# Patient Record
Sex: Female | Born: 1940 | Race: White | Hispanic: No | Marital: Married | Smoking: Never smoker
Health system: Southern US, Community
[De-identification: ages and names within clinical notes are randomized; demographics above are authoritative.]

## PROBLEM LIST (undated history)

## (undated) DIAGNOSIS — G4733 Obstructive sleep apnea (adult) (pediatric): Secondary | ICD-10-CM

## (undated) DIAGNOSIS — I4891 Unspecified atrial fibrillation: Secondary | ICD-10-CM

## (undated) DIAGNOSIS — H3530 Unspecified macular degeneration: Secondary | ICD-10-CM

## (undated) DIAGNOSIS — T7840XA Allergy, unspecified, initial encounter: Secondary | ICD-10-CM

## (undated) HISTORY — DX: Obstructive sleep apnea (adult) (pediatric): G47.33

## (undated) HISTORY — PX: CHOLECYSTECTOMY: SHX55

## (undated) HISTORY — PX: REPLACEMENT TOTAL KNEE BILATERAL: SUR1225

---

## 1995-05-27 DIAGNOSIS — Z8379 Family history of other diseases of the digestive system: Secondary | ICD-10-CM

## 1995-05-27 HISTORY — DX: Family history of other diseases of the digestive system: Z83.79

## 2001-03-11 ENCOUNTER — Encounter (INDEPENDENT_AMBULATORY_CARE_PROVIDER_SITE_OTHER): Payer: Self-pay | Admitting: Specialist

## 2001-03-11 ENCOUNTER — Ambulatory Visit (HOSPITAL_COMMUNITY): Admission: RE | Admit: 2001-03-11 | Discharge: 2001-03-11 | Payer: Self-pay | Admitting: Gastroenterology

## 2001-03-15 ENCOUNTER — Other Ambulatory Visit: Admission: RE | Admit: 2001-03-15 | Discharge: 2001-03-15 | Payer: Self-pay | Admitting: Obstetrics and Gynecology

## 2001-03-16 ENCOUNTER — Encounter: Admission: RE | Admit: 2001-03-16 | Discharge: 2001-03-16 | Payer: Self-pay | Admitting: Obstetrics and Gynecology

## 2001-03-16 ENCOUNTER — Encounter: Payer: Self-pay | Admitting: Obstetrics and Gynecology

## 2001-03-31 ENCOUNTER — Encounter: Payer: Self-pay | Admitting: Obstetrics and Gynecology

## 2001-03-31 ENCOUNTER — Encounter: Admission: RE | Admit: 2001-03-31 | Discharge: 2001-03-31 | Payer: Self-pay | Admitting: Obstetrics and Gynecology

## 2002-05-02 ENCOUNTER — Other Ambulatory Visit: Admission: RE | Admit: 2002-05-02 | Discharge: 2002-05-02 | Payer: Self-pay | Admitting: Obstetrics and Gynecology

## 2003-05-08 ENCOUNTER — Other Ambulatory Visit: Admission: RE | Admit: 2003-05-08 | Discharge: 2003-05-08 | Payer: Self-pay | Admitting: Obstetrics and Gynecology

## 2003-05-16 ENCOUNTER — Ambulatory Visit (HOSPITAL_COMMUNITY): Admission: RE | Admit: 2003-05-16 | Discharge: 2003-05-16 | Payer: Self-pay | Admitting: Obstetrics and Gynecology

## 2004-05-22 ENCOUNTER — Other Ambulatory Visit: Admission: RE | Admit: 2004-05-22 | Discharge: 2004-05-22 | Payer: Self-pay | Admitting: Obstetrics and Gynecology

## 2004-07-15 ENCOUNTER — Encounter (INDEPENDENT_AMBULATORY_CARE_PROVIDER_SITE_OTHER): Payer: Self-pay | Admitting: *Deleted

## 2004-07-15 ENCOUNTER — Ambulatory Visit (HOSPITAL_COMMUNITY): Admission: RE | Admit: 2004-07-15 | Discharge: 2004-07-15 | Payer: Self-pay | Admitting: Gastroenterology

## 2004-08-13 ENCOUNTER — Encounter: Admission: RE | Admit: 2004-08-13 | Discharge: 2004-11-11 | Payer: Self-pay | Admitting: Family Medicine

## 2004-12-31 ENCOUNTER — Encounter: Admission: RE | Admit: 2004-12-31 | Discharge: 2005-03-31 | Payer: Self-pay | Admitting: Family Medicine

## 2005-05-28 ENCOUNTER — Other Ambulatory Visit: Admission: RE | Admit: 2005-05-28 | Discharge: 2005-05-28 | Payer: Self-pay | Admitting: Obstetrics and Gynecology

## 2007-07-30 ENCOUNTER — Other Ambulatory Visit: Admission: RE | Admit: 2007-07-30 | Discharge: 2007-07-30 | Payer: Self-pay | Admitting: Obstetrics and Gynecology

## 2008-08-04 ENCOUNTER — Encounter: Admission: RE | Admit: 2008-08-04 | Discharge: 2008-08-04 | Payer: Self-pay | Admitting: Family Medicine

## 2008-08-08 ENCOUNTER — Other Ambulatory Visit: Admission: RE | Admit: 2008-08-08 | Discharge: 2008-08-08 | Payer: Self-pay | Admitting: Obstetrics and Gynecology

## 2008-08-09 ENCOUNTER — Encounter: Admission: RE | Admit: 2008-08-09 | Discharge: 2008-08-09 | Payer: Self-pay | Admitting: Family Medicine

## 2008-12-04 ENCOUNTER — Inpatient Hospital Stay (HOSPITAL_COMMUNITY): Admission: RE | Admit: 2008-12-04 | Discharge: 2008-12-11 | Payer: Self-pay | Admitting: Orthopedic Surgery

## 2009-01-24 ENCOUNTER — Ambulatory Visit (HOSPITAL_COMMUNITY): Admission: RE | Admit: 2009-01-24 | Discharge: 2009-01-24 | Payer: Self-pay | Admitting: Cardiology

## 2009-02-14 ENCOUNTER — Ambulatory Visit (HOSPITAL_COMMUNITY): Admission: RE | Admit: 2009-02-14 | Discharge: 2009-02-14 | Payer: Self-pay | Admitting: Orthopedic Surgery

## 2009-08-20 ENCOUNTER — Other Ambulatory Visit: Admission: RE | Admit: 2009-08-20 | Discharge: 2009-08-20 | Payer: Self-pay | Admitting: Obstetrics and Gynecology

## 2010-01-07 ENCOUNTER — Ambulatory Visit: Payer: Self-pay | Admitting: Cardiology

## 2010-01-07 ENCOUNTER — Inpatient Hospital Stay (HOSPITAL_COMMUNITY): Admission: RE | Admit: 2010-01-07 | Discharge: 2010-01-12 | Payer: Self-pay | Admitting: Orthopedic Surgery

## 2010-08-09 LAB — COMPREHENSIVE METABOLIC PANEL
ALT: 25 U/L (ref 0–35)
BUN: 15 mg/dL (ref 6–23)
CO2: 29 mEq/L (ref 19–32)
Calcium: 9.7 mg/dL (ref 8.4–10.5)
Creatinine, Ser: 0.89 mg/dL (ref 0.4–1.2)
Total Bilirubin: 0.7 mg/dL (ref 0.3–1.2)
Total Protein: 6.5 g/dL (ref 6.0–8.3)

## 2010-08-09 LAB — CBC
HCT: 29 % — ABNORMAL LOW (ref 36.0–46.0)
HCT: 32.4 % — ABNORMAL LOW (ref 36.0–46.0)
Hemoglobin: 9.7 g/dL — ABNORMAL LOW (ref 12.0–15.0)
MCH: 30 pg (ref 26.0–34.0)
MCH: 30 pg (ref 26.0–34.0)
MCH: 30.5 pg (ref 26.0–34.0)
MCH: 30.5 pg (ref 26.0–34.0)
MCHC: 33.5 g/dL (ref 30.0–36.0)
MCHC: 33.8 g/dL (ref 30.0–36.0)
MCHC: 33.8 g/dL (ref 30.0–36.0)
Platelets: 167 10*3/uL (ref 150–400)
Platelets: 178 10*3/uL (ref 150–400)
RBC: 3.24 MIL/uL — ABNORMAL LOW (ref 3.87–5.11)
RBC: 4.86 MIL/uL (ref 3.87–5.11)
RDW: 14 % (ref 11.5–15.5)
RDW: 14.1 % (ref 11.5–15.5)
WBC: 6.7 10*3/uL (ref 4.0–10.5)
WBC: 8.3 10*3/uL (ref 4.0–10.5)
WBC: 6.2 10*3/uL (ref 4.0–10.5)

## 2010-08-09 LAB — PROTIME-INR
INR: 1.54 — ABNORMAL HIGH (ref 0.00–1.49)
INR: 1.63 — ABNORMAL HIGH (ref 0.00–1.49)
INR: 1.67 — ABNORMAL HIGH (ref 0.00–1.49)
INR: 1.73 — ABNORMAL HIGH (ref 0.00–1.49)
INR: 1.02 (ref 0.00–1.49)
Prothrombin Time: 15.2 seconds (ref 11.6–15.2)
Prothrombin Time: 13.6 seconds (ref 11.6–15.2)

## 2010-08-09 LAB — BASIC METABOLIC PANEL
BUN: 10 mg/dL (ref 6–23)
BUN: 10 mg/dL (ref 6–23)
BUN: 10 mg/dL (ref 6–23)
CO2: 30 mEq/L (ref 19–32)
Calcium: 7.9 mg/dL — ABNORMAL LOW (ref 8.4–10.5)
Calcium: 8.1 mg/dL — ABNORMAL LOW (ref 8.4–10.5)
Chloride: 100 mEq/L (ref 96–112)
Chloride: 102 mEq/L (ref 96–112)
Chloride: 103 mEq/L (ref 96–112)
GFR calc Af Amer: 59 mL/min — ABNORMAL LOW (ref >60)
GFR calc non Af Amer: 49 mL/min — ABNORMAL LOW (ref >60)
Glucose, Bld: 108 mg/dL — ABNORMAL HIGH (ref 70–99)
Potassium: 3.9 mEq/L (ref 3.5–5.1)
Potassium: 3.9 mEq/L (ref 3.5–5.1)
Sodium: 134 mEq/L — ABNORMAL LOW (ref 135–145)
Sodium: 136 mEq/L (ref 135–145)

## 2010-08-09 LAB — TYPE AND SCREEN: ABO/RH(D): A NEG

## 2010-08-09 LAB — APTT: aPTT: 40 seconds — ABNORMAL HIGH (ref 24–37)

## 2010-08-09 LAB — TROPONIN I: Troponin I: 0.03 ng/mL (ref 0.00–0.06)

## 2010-08-09 LAB — URINALYSIS, ROUTINE W REFLEX MICROSCOPIC: Specific Gravity, Urine: 1.009 (ref 1.005–1.030)

## 2010-08-09 LAB — SURGICAL PCR SCREEN: Staphylococcus aureus: NEGATIVE

## 2010-08-09 LAB — CK TOTAL AND CKMB (NOT AT ARMC): Relative Index: INVALID (ref 0.0–2.5)

## 2010-08-23 ENCOUNTER — Ambulatory Visit (HOSPITAL_COMMUNITY)
Admission: RE | Admit: 2010-08-23 | Discharge: 2010-08-23 | Disposition: A | Discharge status: Home / Self Care | Payer: Medicare Other | Source: Ambulatory Visit | Attending: Orthopedic Surgery | Admitting: Orthopedic Surgery

## 2010-08-23 DIAGNOSIS — M7989 Other specified soft tissue disorders: Secondary | ICD-10-CM | POA: Insufficient documentation

## 2010-08-23 DIAGNOSIS — M79609 Pain in unspecified limb: Secondary | ICD-10-CM | POA: Insufficient documentation

## 2010-09-01 LAB — BASIC METABOLIC PANEL
BUN: 6 mg/dL (ref 6–23)
BUN: 11 mg/dL (ref 6–23)
BUN: 6 mg/dL (ref 6–23)
Calcium: 7.8 mg/dL — ABNORMAL LOW (ref 8.4–10.5)
Calcium: 8.1 mg/dL — ABNORMAL LOW (ref 8.4–10.5)
Chloride: 107 mEq/L (ref 96–112)
GFR calc Af Amer: >60 mL/min (ref >60)
GFR calc Af Amer: >60 mL/min (ref >60)
GFR calc non Af Amer: >60 mL/min (ref >60)
GFR calc non Af Amer: 60 mL/min — ABNORMAL LOW (ref >60)
GFR calc non Af Amer: >60 mL/min (ref >60)
GFR calc non Af Amer: >60 mL/min (ref >60)
Glucose, Bld: 104 mg/dL — ABNORMAL HIGH (ref 70–99)
Glucose, Bld: 122 mg/dL — ABNORMAL HIGH (ref 70–99)
Potassium: 4.2 mEq/L (ref 3.5–5.1)
Potassium: 4.2 mEq/L (ref 3.5–5.1)
Sodium: 136 mEq/L (ref 135–145)
Sodium: 131 mEq/L — ABNORMAL LOW (ref 135–145)

## 2010-09-01 LAB — CBC
HCT: 25.6 % — ABNORMAL LOW (ref 36.0–46.0)
HCT: 25.8 % — ABNORMAL LOW (ref 36.0–46.0)
HCT: 29.1 % — ABNORMAL LOW (ref 36.0–46.0)
HCT: 35.5 % — ABNORMAL LOW (ref 36.0–46.0)
Hemoglobin: 8.5 g/dL — ABNORMAL LOW (ref 12.0–15.0)
Hemoglobin: 10.1 g/dL — ABNORMAL LOW (ref 12.0–15.0)
Hemoglobin: 14.7 g/dL (ref 12.0–15.0)
MCHC: 33.9 g/dL (ref 30.0–36.0)
MCV: 89.6 fL (ref 78.0–100.0)
MCV: 90.3 fL (ref 78.0–100.0)
Platelets: 167 10*3/uL (ref 150–400)
Platelets: 153 10*3/uL (ref 150–400)
Platelets: 169 10*3/uL (ref 150–400)
RBC: 2.88 MIL/uL — ABNORMAL LOW (ref 3.87–5.11)
RBC: 3.36 MIL/uL — ABNORMAL LOW (ref 3.87–5.11)
RBC: 4.83 MIL/uL (ref 3.87–5.11)
RDW: 13.7 % (ref 11.5–15.5)
RDW: 13.2 % (ref 11.5–15.5)
WBC: 4.5 10*3/uL (ref 4.0–10.5)
WBC: 4.9 10*3/uL (ref 4.0–10.5)
WBC: 5.4 10*3/uL (ref 4.0–10.5)
WBC: 6 10*3/uL (ref 4.0–10.5)

## 2010-09-01 LAB — COMPREHENSIVE METABOLIC PANEL
ALT: 26 U/L (ref 0–35)
AST: 25 U/L (ref 0–37)
Alkaline Phosphatase: 86 U/L (ref 39–117)
CO2: 28 mEq/L (ref 19–32)
Chloride: 105 mEq/L (ref 96–112)
GFR calc Af Amer: >60 mL/min (ref >60)
GFR calc non Af Amer: >60 mL/min (ref >60)
Glucose, Bld: 88 mg/dL (ref 70–99)
Potassium: 4.8 mEq/L (ref 3.5–5.1)
Sodium: 142 mEq/L (ref 135–145)
Total Bilirubin: 0.5 mg/dL (ref 0.3–1.2)

## 2010-09-01 LAB — URINALYSIS, ROUTINE W REFLEX MICROSCOPIC
Bilirubin Urine: NEGATIVE
Glucose, UA: NEGATIVE mg/dL
Hgb urine dipstick: NEGATIVE
Ketones, ur: NEGATIVE mg/dL
pH: 5 (ref 5.0–8.0)

## 2010-09-01 LAB — PROTIME-INR
INR: 2.2 — ABNORMAL HIGH (ref 0.00–1.49)
INR: 1.7 — ABNORMAL HIGH (ref 0.00–1.49)
Prothrombin Time: 29.4 seconds — ABNORMAL HIGH (ref 11.6–15.2)
Prothrombin Time: 16.3 seconds — ABNORMAL HIGH (ref 11.6–15.2)

## 2010-09-01 LAB — CARDIAC PANEL(CRET KIN+CKTOT+MB+TROPI)
CK, MB: 1.3 ng/mL (ref 0.3–4.0)
CK, MB: 1.9 ng/mL (ref 0.3–4.0)
Relative Index: INVALID (ref 0.0–2.5)
Total CK: 25 U/L (ref 7–177)
Total CK: 41 U/L (ref 7–177)
Troponin I: 0.03 ng/mL (ref 0.00–0.06)
Troponin I: 0.06 ng/mL (ref 0.00–0.06)

## 2010-09-01 LAB — ABO/RH: ABO/RH(D): A NEG

## 2010-09-01 LAB — TYPE AND SCREEN

## 2010-10-08 NOTE — H&P (Signed)
NAME:  Rebecca Short, Rebecca Short                 ACCOUNT NO.:  [id number]   MEDICAL RECORD NO.:  [id number]          PATIENT TYPE:  INP   LOCATION:  NA                           FACILITY:  [facility]   PHYSICIAN:  Ollen Gross, M.D.    DATE OF BIRTH:  [birth year]   DATE OF ADMISSION:  12/04/2008  DATE OF DISCHARGE:                              HISTORY & PHYSICAL   CHIEF COMPLAINT:  Left greater than right knee pain.   HISTORY OF PRESENT ILLNESS:  The patient is a 70 year old female, who  has seen by Dr. Lequita Halt as a new patient earlier this year, with  complaints of left greater than right knee pain that has been ongoing  for quite some time now, back into the 80s.  It has progressively gotten  worse with time.  Unfortunately, she has gone on to develop end-stage  arthritis, which is on the left, a little worse radiographically than  the right, and also symptomatically.  She has been treated  conservatively in the past for her issues.  Unfortunately, she has  reached a point now where she would like to have something surgically  done.  The risks and benefits been discussed and she elects to proceed  with surgery.  She has been seen by Dr. Catha Gosselin, and the patient  states that he has given her a verbal clearance for her surgery.   ALLERGIES:  GLUCOSAMINE CHONDROITIN.   CURRENT MEDICATIONS:  Multivitamin, calcium plus D, vitamin B6, lutein,  Refresh eye drops.   PAST MEDICAL HISTORY:  1. Macular degeneration.  2. Cataracts.  3. Bowen's disease of the skin.  4. Degenerative disk disease with spinal stenosis.   PAST SURGICAL HISTORY:  1. Cyst removed from her right wrist, 1958.  2. Gallbladder removal in 1997.   FAMILY HISTORY:  Father deceased, age 25, with strokes.  Mother  deceased, age 64, osteoporosis and also had macular degeneration.  She  had a brother, who died at age 56, diabetes with kidney failure. He was  on dialysis.   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  Nonsmoker.  No  alcohol.  No  children.  Lives with her husband.  Unfortunately, her husband has heart  disease and has a previous heart attack with coronary stenting so she  does not know if she will be able to go home versus may want to look  into a skilled facility.  She lives in a 1-story home with 2 steps  entering.  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   REVIEW OF SYSTEMS:  GENERAL:  No fevers, chills, or night sweats.  NEURO:  No seizures, syncope, or paralysis.  RESPIRATORY:  A little bit  of seasonal allergies, but no productive cough or hemoptysis.  CARDIOVASCULAR:  No chest pain or orthopnea.  GI:  No nausea, vomiting,  diarrhea, or constipation.  GU:  No dysuria, hematuria, or discharge.  MUSCULOSKELETAL:  Knee pain.   PHYSICAL EXAMINATION:  VITAL SIGNS:  Pulse 58, respirations 12, blood  pressure 138/78.  GENERAL:  A 70 year old white female, well-nourished,  well-developed,  slightly overweight, in no acute distress.  She is alert and oriented  and cooperative, very pleasant, an excellent historian.  HEENT:  Normocephalic, atraumatic.  Pupils round and reactive, not  wearing glasses.  EOM intact.  NECK:  Supple.  CHEST:  Clear.  HEART:  Regular rate and rhythm with a grade 2/6 systolic ejection  murmur best heard over the pulmonic point.  ABDOMEN:  Soft, round, bowel  sounds present.  RECTAL, BREASTS, GENITALIA:  Not done and not pertinent to the present  illness.  EXTREMITIES:  Moderate crepitus, tender more medial than lateral, no  instability.  Right knee:  Moderate crepitus, tender more medial than  lateral, no instability.   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   PLAN:  The patient will be admitted to [facility] to undergo a  left total knee replacement arthroplasty.  Surgery will be performed by  Dr. Ollen Gross.      Alexzandrew L. Perkins, P.A.C.      Ollen Gross, M.D.  Electronically Signed     ALP/MEDQ  D:  12/03/2008  T:  12/03/2008  Job:  [id number]   cc:   Caryn Bee L. Little, M.D.  Fax: [phone]

## 2010-10-08 NOTE — Op Note (Signed)
NAME:  Rebecca Short, Rebecca Short                 ACCOUNT NO.:  [id number]   MEDICAL RECORD NO.:  [id number]          PATIENT TYPE:  INP   LOCATION:  0003                         FACILITY:  [facility]   PHYSICIAN:  Ollen Gross, M.D.    DATE OF BIRTH:  Jul 30, 1940   DATE OF PROCEDURE:  12/04/2008  DATE OF DISCHARGE:                               OPERATIVE REPORT   PREOPERATIVE DIAGNOSIS:  Osteoarthritis, left knee.   POSTOPERATIVE DIAGNOSIS:  Osteoarthritis, left knee.   PROCEDURE:  Left total knee arthroplasty.   SURGEON:  Ollen Gross, M.D.   ASSISTANT:  Avel Peace, PA-C.   ANESTHESIA:  General with postop Marcaine pain pump.   ESTIMATED BLOOD LOSS:  Minimal.   DRAINS:  None.   TOURNIQUET TIME:  35 minutes at 300 mmHg.   COMPLICATIONS:  None.   CONDITION:  Stable to recovery.   BRIEF CLINICAL NOTE:  Ms. Mothershead is a 70 year old female with end-stage  arthritis of the left knee with progressively worsening pain and  dysfunction.  She has failed nonoperative management and presents now  for total knee arthroplasty.   PROCEDURE IN DETAIL:  After successful administration of general  anesthetic, a tourniquet was placed high on her left thigh and the left  lower extremity prepped and draped in the usual sterile fashion.  The  extremity was wrapped in Esmarch and tourniquet inflated to 300 mmHg.  A  midline incision is made with a 10 blade through subcutaneous tissue to  the level of the extensor mechanism.  A fresh blade is used make a  medial parapatellar arthrotomy.  Soft tissue over the proximal medial  tibia is subperiosteally elevated to the joint line with the knife and  into the semimembranosus bursa with a Cobb elevator.  Soft tissue  laterally is elevated with attention being paid to avoiding the patellar  tendon on the tibial tubercle.  The patella was everted, the knee flexed  90 degrees, and ACL and PCL removed.  A drill was used to create a  starting hole in the distal femur  and the canal was thoroughly  irrigated.  The 5-degree left valgus alignment guide is placed and  referencing off the posterior condyles, rotation is marked and the block  pinned to remove 10 mm off the distal femur.  Distal femoral resection  is made with an oscillating saw.  Sizing block is placed, size 2.5 is  most appropriate.  Rotation is marked off the epicondylar axis.  Size  2.5 cutting block is placed and the anterior, posterior, and chamfer  cuts made.   The tibia is subluxed forward and menisci removed.  Extramedullary  tibial alignment guide is placed, referencing proximally at the medial  aspect of the tibial tubercle and distally along the second metatarsal  axis and tibial crest.  A block is pinned to remove about 2 mm off the  more deficient medial side.  Tibial resection is made with an  oscillating saw.  Size 2 is the most appropriate tibial component and  the proximal tibia is prepared with a modular drill and keel punch for  the size 2.  Femoral preparation is completed with the intercondylar cut  for the size 2.5.   Size 2 mobile bearing tibial trial, size 2.5 posterior stabilized  femoral trial, and a 10-mm posterior stabilized rotating platform insert  trial are placed.  With the 10, there is a tiny bit of hyperextension,  so I went the 12.5, which allowed for full extension with excellent  varus-valgus and anterior-posterior balance throughout full range of  motion.  The patella was then everted and thickness measured to be 23  mm.  Free-hand resection is taken 13 mm, 35 template is placed, lug  holes are drilled, trial patella is placed, and it tracks normally.  Osteophytes are removed off the posterior femur with the trial in place.   All trials are removed and the cut bone surfaces are prepared with  pulsatile lavage.  Cement is mixed and once ready for implantation, the  size 2 mobile bearing tibial tray, size 2.5 posterior stabilized femur,  and 35 patella  are cemented into place.  The patella was held with a  clamp.  Trial 12.5 insert is placed, the knee held in full extension,  and all extruded cement removed.  When the cement has fully hardened,  then a permanent 12.5-mm posterior stabilized rotating platform insert  is placed into the tibial tray.   The wound is copiously irrigated with saline solution and the FloSeal  injected on the posterior capsule, medial and lateral gutters, and the  suprapatellar area.  A moist sponge is placed and the tourniquet  released for a total time of 35 minutes.  The sponge is held for 2  minutes, then removed.  Minimal bleeding is encountered.  Bleeding that  is encountered is stopped with electrocautery.  The wound is further  irrigated to remove the FloSeal and then the arthrotomy closed with  interrupted #1 PDS.  Flexion against gravity is about 135 degrees.  The  subcu is closed with interrupted 2-0 Vicryl and subcuticular running 4-0  Monocryl.  The incision is cleaned and dried and then the catheter for  Marcaine pain pump is placed and pump initiated.  Steri-Strips and a  bulky sterile dressing are applied and she is placed into a knee  immobilizer, awakened, and transported to recovery in stable condition.      Ollen Gross, M.D.  Electronically Signed     FA/MEDQ  D:  12/04/2008  T:  12/04/2008  Job:  [id number]

## 2010-10-08 NOTE — Discharge Summary (Signed)
NAMEADALY, PUDER                 ACCOUNT NO.:  [id number]   MEDICAL RECORD NO.:  [id number]          PATIENT TYPE:  INP   LOCATION:  1416                         FACILITY:  [facility]   PHYSICIAN:  Ollen Gross, M.D.    DATE OF BIRTH:  [birth year]   DATE OF ADMISSION:  12/04/2008  DATE OF DISCHARGE:  12/11/2008                               DISCHARGE SUMMARY   ADMITTING DIAGNOSES:  1. 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  2. Macular degeneration.  3. Cataracts.  4. Bowen's disease of the skin.  5. Degenerative disk disease.  6. Spinal stenosis.   DISCHARGE DIAGNOSES:  1. Osteoarthritis of the left knee, status post left total knee      replacement arthroplasty.  2. Postoperative hyponatremia, improved.  3. Postoperative hypokalemia, improved.  4. Postoperative atrial fibrillation with rapid ventricular rate,      resolved.   Remaining discharge diagnoses same as admitting diagnoses.   PROCEDURE:  December 04, 2008 left total knee.   SURGEON:  Dr. Lequita Halt.   ASSISTANT:  Avel Peace, PA-C.   ANESTHESIA:  General.   TOURNIQUET TIME:  Thirty-five minutes.   CONSULTS:  [facility].   BRIEF HISTORY:  Ms. Yearwood is a 70 year old female who has been seen by  Dr. Lequita Halt for ongoing bilateral knee pain, left greater than right, it  is progressively getting worse.  Felt to be a good candidate.  Risks and  benefits discussed, subsequently admitted to the hospital.   LABORATORY DATA:  Preoperative CBC showed a hemoglobin of 14.7,  hematocrit of 43.8, white cell count 6, platelets 215, PT/INR 13.6 and  1, PTT of 38.  Chem panel on admission all within normal limits.  Preop  UA negative.  Blood group type A negative.  Serial CBCs were followed.  Hemoglobin dropped down to 11.8 then to 10.1, got as low as 8.5 came  back up to 8.7 with a crit of 25.8.  Serial ProTimes followed per  Coumadin protocol.  Last known PT/INR 29.4 and 2.6.  Serial BMETs were  followed.   Sodium dropped from 142 to 131, back up to 136.  Potassium  dropped from 4.8 to 3.3 back up to 4.2.  Remaining electrolytes remained  within normal limits.  TSH level taken on December 07, 2008 normal at 0.87.  There cardiac enzyme panels x3 taken on December 07, 2008 first set CK 41,  CK-MB 1.9, troponin 0.06.  Second set CK 30, CK-MB 1.6, troponin 0.04.  Third set CK 25, CK-MB 0.1 and troponin 0.03, going back down.   X-RAYS:  Chest x-ray November 29, 2008:  No acute findings.   EKG December 07, 2008:  Atrial fibrillation, rapid ventricular response,  premature aberrant conducted complexes unconfirmed.   Follow-up EKG December 07, 2008:  Normal sinus rhythm, anterior infarct age  undetermined, ST and T-wave abnormalities, consider lateral ischemia,  confirmed by Dr. Aggie Cosier.   HOSPITAL COURSE:  The patient admitted to [facility], taken to  OR, underwent the above stated procedure without complication.  The  patient tolerated the procedure well, later  transferred to the recovery  room orthopedic floor.  Started on PCA and p.o. pain control following  surgery.  Doing pretty well on the morning of day one, started getting  up out of bed.  She wanted to look into [facility] as far as a skilled  nursing facility so we got social work involved right away.  Hemoglobin  looked good.  Lytes looked good.  Started getting up out of bed, walked  about 30 feet.  By day two she was doing well, dressing change and  incision looked good although she had a drop in her sodium so we  discontinued the fluids and rechecked sodium level.  Continue with  therapy, arrangements being made for [facility].  Unfortunately on the  evening of postoperative day two she had an increase in her pulse rate,  up in the 130s.  EKG was checked and found to have atrial fibrillation  and rapid ventricular response.  The on-call covering services consulted  the [facility].  The patient was seen and had a bolus of IV   Cardizem and then started on a drip.  Eventually converted over to sinus  rhythm.  She was seen on day three and the monitor tech said that she  had been going in and out but her rates were much better, she is back  down in the 80s on her pulse rate.  She did have cardiac enzymes checked  and her first panel cardiac enzymes were normal.  Her sodium had already  started to improve but her potassium was down, I put her on some  potassium supplements.  Hemoglobin was down to 9.7 so we put her on some  iron supplements.  She ended up staying in the 80s.  By postoperative  day four her hemoglobin was down a little bit further at 8.5, she was is  asymptomatic with this.  No complaints.  Kept her on the iron  supplement.  Sodium was back up and the potassium had already improved  with oral supplementation.  We relied on medicine for direction with the  atrial fib.  She was followed back up by medicine.  Had a TSH level  taken which was found to be normal.  They felt like she converted over  and put her on p.o. Cardizem.  Felt like there was no further workup  needed at this time.  By postoperative day five and six over the weekend  she continued to do well, no complaints.  Wound was healing well.  She  was walking with therapy, progressing with her mobility.  She was seen  back on rounds on December 11, 2008. no complaints, incision healing well,  felt that she was ready for transfer.  We are waiting on final bed  availability.  We are making arrangements to go over to [facility] as  long as a bed was available.   DISCHARGE PLAN:  The patient to be transferred over to [facility] on  December 11, 2008.   DISCHARGE DIAGNOSES:  Please see above.   DISCHARGE MEDICATIONS:  1. Coumadin protocol.  She is to be on Coumadin for a total of 3 weeks      from the date of surgery of December 04, 2008, please titrate the      Coumadin level for a target INR to stay between 2 and 3.  2. Colace 100 mg p.o. b.i.d.   3. Nu-Iron 150 mg p.o. b.i.d.  4. Cardizem CD 120 mg daily (  this is a new medication added).  5. Robaxin 500 mg p.o. q.6 - 8 hours p.r.n. spasm.  6. Tylenol 325 one or two every 4 - 6 hours as needed for mild pain,      temperature or headache.  7. Percocet 5 mg one or two every 4 hours as needed for pain.  8. Laxative of choice.  9. Enema of choice.   Her home medications consisting of several vitamins and supplements were  on hold at this time while on Coumadin.   DIET:  Heart-healthy diet.   ACTIVITY:  She is weightbearing as tolerated for the left total knee,  total knee protocol.  Please continue with gait training, ambulation,  ADLs, range of motion and strengthening exercises.  PT and OT for knee  protocol.  She may start showering however do not submerge the incision  under water.  She is able to do multiple straight leg raises at the time  of discharge so she does not need her knee immobilizer for mobility  anymore.   FOLLOWUP:  She needs to follow up with Dr. Ollen Gross in the office  at the [facility] on  Tuesday July 27 or Thursday July 29, please contact the office at [phone]  [phone] to help arrange appointment time and transfer of the patient for  follow-up care.   The patient states she has been seen by Dr. Ty Hilts in the past and  would like to follow up with a Cardiologist.  We do recommend that she  follow-up with her Cardiologist in the next couple of weeks.  Please  assist the patient in helping arrange follow-up care for follow-up of  her history of atrial fibrillation that occurred during the hospital  stay.   DISPOSITION:  [facility].   CONDITION UPON DISCHARGE:  Improving at this time.      Alexzandrew L. Perkins, P.A.C.      Ollen Gross, M.D.  Electronically Signed    ALP/MEDQ  D:  12/11/2008  T:  12/11/2008  Job:  [id number]   cc:   Ollen Gross, M.D.  Fax: [phone]   Anna Genre Little, M.D.   Fax: [phone]   [facility]   Dr. Peterson Lombard

## 2010-10-08 NOTE — Consult Note (Signed)
NAME:  Rebecca Short, Rebecca Short                 ACCOUNT NO.:  [id number]   MEDICAL RECORD NO.:  [id number]          PATIENT TYPE:  INP   LOCATION:  1612                         FACILITY:  [facility]   PHYSICIAN:  Lucile Crater, MD         DATE OF BIRTH:  Jul 06, 1940   DATE OF CONSULTATION:  DATE OF DISCHARGE:                                 CONSULTATION   PRIMARY CARE PHYSICIAN:  Dr. Catha Gosselin.   REASON FOR CONSULTATION:  Atrial fibrillation with rapid ventricular  response.   HISTORY OF PRESENT ILLNESS:  Rebecca Short is a 70 year old Caucasian female  who was admitted to [facility] for a left total knee  arthroplasty.  She underwent the procedure on December 03, 2008.  She had an  uneventful postop course until tonight when she was found to be in  atrial fibrillation with a rate of 130s to 150s.  An IM consult was  requested to evaluate and manage the atrial fibrillation.  The patient  denies having any symptoms.  She denies any palpitations.  She had an  episode of lightheadedness earlier today in rehab.  She denies any chest  pain or shortness of breath.  She has never had symptoms like this in  the past.  She was never diagnosed with atrial fibrillation in the past.   PAST MEDICAL HISTORY:  1. Macular degeneration.  2. Cataracts.  3. Bowen's disease of the skin.  4. Degenerative disk disease with spinal stenosis.  5. Cyst removal from the right wrist in 1958.  6. Cholecystectomy in 1997/   ALLERGIES:  Chondroitin sulfate and glucosamine.   INPATIENT MEDICATIONS:  1. Docusate 100 mg p.o. b.i.d.  2. Warfarin.  3. Tylenol q.4 hours p.r.n.  4. Dulcolax 10 mg p.o. daily p.r.n.  5. Chloraseptic spray as needed.  6. Maalox 30 mL p.o. q.4 hours p.r.n.  7. Cepacol lozenges 1 p.o. p.r.n.  8. Methocarbamol p.r.n.  9. Percocet 1-2 tablets p.o. q.4 hours p.r.n. pain.  10.Zolpidem 5 mg p.o. q.h.s. p.r.n. insomnia.   SOCIAL HISTORY:  No history of tobacco, alcohol or illicit drugs.  She  currently lives with her husband.   FAMILY HISTORY:  Father had a history of CVA, CAD and CHF.  Mother had a  history of osteoporosis and macular degeneration.  Brother had diabetes  with chronic kidney failure requiring dialysis.   PHYSICAL EXAMINATION:  VITAL SIGNS:  Pulse 157, respiration 16, blood  pressure 109/67.  O2 saturation 97% on room-air.  Afebrile.  GENERAL APPEARANCE:  Not in acute distress, alert and oriented x3.  HEENT:  Normocephalic, atraumatic. Pupils are equal and reactive to  light and accommodation, extraocular muscles are intact.  Mucous  membranes are moist.  NECK:  Supple.  No JVD, lymphadenopathy or carotid bruits.  CVS:  Irregularly irregular, tachycardiac.  No murmurs, rubs or gallops.  LUNGS:  Clear to auscultation bilaterally.  ABDOMEN:  Soft, nontender, nondistended.  No hepatosplenomegaly.  EXTREMITIES:  No clubbing or cyanosis, 1+ edema bilaterally.  Left knee  brace in place.  NEUROLOGIC:  Exam is grossly  nonfocal.   LABORATORY DATA:  On 12/06/2008:  WBC 6700, hemoglobin 10.1, hematocrit  30.4, platelets 152,000.  INR 1.7.  Sodium 131, potassium 3.7, chloride  98, bicarb 29, BUN 7, creatinine 0.9, blood glucose 128.   ASSESSMENT/PLAN:  1. Atrial fibrillation with rapid ventricular response, this is new-      onset.  The patient was given a bolus of 10 mg of diltiazem      followed by 5. Her rate was still in the 120s.  Will start the      patient on a diltiazem drip.  Once the rate is controlled will      switch over to p.o. medication.  If she becomes unstable will call      a Cardiology consult.  Will check a TSH and cycle cardiac enzymes      x3.  Will repeat an EKG in the morning.  Will also consider a 2-D      echocardiogram.  2. Left total knee arthroplasty.  Per primary team.  3. DVT prophylaxis. The patient is on Coumadin.  4. Fluids/electrolytes/nutrition.  Will continue normal saline at a      rate of 150 cc/hour until the blood  pressure is stable.  Diet per      primary team.  Replace electrolytes prn.  5. Disposition.  Will continue to follow.   Thank you for the consultation.      Lucile Crater, MD  Electronically Signed     TA/MEDQ  D:  12/07/2008  T:  12/07/2008  Job:  [id number]   cc:   Ollen Gross, M.D.  Fax: [phone]   Anna Genre Little, M.D.  Fax: [phone]

## 2010-10-11 NOTE — Op Note (Signed)
NAME:  Rebecca Short, Rebecca Short                 ACCOUNT NO.:  [id number]   MEDICAL RECORD NO.:  [id number]          PATIENT TYPE:  AMB   LOCATION:  ENDO                         FACILITY:  [facility]   PHYSICIAN:  Petra Kuba, M.D.    DATE OF BIRTH:  1941/04/14   DATE OF PROCEDURE:  07/15/2004  DATE OF DISCHARGE:                                 OPERATIVE REPORT   PROCEDURE:  Colonoscopy with hot biopsy.   INDICATIONS FOR PROCEDURE:  History of colon polyps due for repeat  screening.  Consent was signed after risks, benefits, methods and options  thoroughly discussed in the office multiple times.   MEDICINES USED:  Demerol 80, Versed 8.   DESCRIPTION OF PROCEDURE:  Rectal inspection is pertinent for external  hemorrhoids, small.  Digital examination was negative.  Video colonoscope  was inserted with much more difficulty than before which required rolling  her back and forth from her left side, to her back, to her right side, back  again to her left side and then back to her back.  We were able to advance  to the cecum.  This did require multiple abdominal pressure and on insertion  left-sided diverticula were seen but no other abnormalities.  The prep was  fairly adequate.  There was some stool adherent to the wall, right greater  than left which could not be washed off.  The cecum was identified by the  appendiceal orifice and the ileocecal valve.  There was some stool in the  cecum that could not be washed off as well as in the ascending colon.  Scope  was slowly withdrawn.  Other than the left-sided diverticula and three tiny  sigmoid polyps which were all hot biopsied x1, no other polypoid lesions  were seen as we slowly withdrew back to the rectum.  Anorectal pull-through  and retroflexion confirmed some small hemorrhoids.  Scope was straightened  and readvanced a short ways up the left side of the colon.  Air was  suctioned, scope was removed.  The patient tolerated the procedure well.  There  was no obvious immediate complication.   ENDOSCOPIC DIAGNOSES:  1.  Internal and external hemorrhoids.  2.  Left-sided diverticula.  3.  Long looping tortuous colon.  4.  Three tiny sigmoid polyps hot biopsied.  5.  Otherwise within normal limits to the cecum.   PLAN:  Await pathology to determine future colonic screening.  Might  consider a virtual colonoscopy if widely acceptable and available at that  junction.  Happy to see back p.r.n..  Otherwise return car to Dr. Clarene Duke for  the customary healthcare maintenance to include yearly rectals and guaiacs.      MEM/MEDQ  D:  07/15/2004  T:  07/15/2004  Job:  [id number]   cc:   Caryn Bee L. Little, M.D.  [address]  [place]  [place] [address]  Fax: [phone]

## 2010-10-11 NOTE — Procedures (Signed)
[facility]  Patient:    DIM, MEISINGER Visit Number: [id number] MRN: [id number]          Service Type: END Location: ENDO Attending Physician:  Nelda Marseille Proc. Date: 03/11/01 Admit Date:  03/11/2001   CC:         Caryn Bee L. Little, M.D.   Procedure Report  PROCEDURE:  Colonoscopy with polypectomy.  INDICATION:  Patient with history of colon polyps, overdue for colonic screening.  Consent was signed after risks, benefits methods and options thoroughly discussed in the office.  MEDICATIONS:  Demerol 60, Versed 6.  DESCRIPTION OF PROCEDURE:  Rectal inspection is pertinent for external hemorrhoids, small.  Digital exam was negative.  The video colonoscope was inserted and with minimal difficulty due to some tortuosity and some looping, we were able to advance to the cecum.  This did require rolling her on her back and some abdominal pressure.  Other than some left-sided diverticula, no other abnormalities were seen on insertion.  The prep was fairly adequate. She did have some liquid stool and some formed stool balls which required lots of washing suctioning, but adequate visualization was had.  The cecum was identified by the appendiceal orifice and the ileocecal valve.  There was a tiny cecal polyp which was cold biopsied x 1.  On slow withdrawal through the colon in the transverse and descending, three tiny other polyps, and each was hot biopsied and put in the same container with the cecal polyp.  The scope was further withdrawn.  There was some left-sided diverticula and some wall edema and spasm.  In the distal sigmoid a semi-sessile, soft polyp was seen, snared, electrocautery applied, and the polyp was suctioned through the scope and collected in the trap.  Using the snare technique did cause the polyp to break into multiple pieces.  We did go ahead and hot biopsy the base and also hot biopsied two other tiny distal sigmoid polyps and  put them in the same container.  Once back in the rectum, the scope was retroflexed, pertinent for some internal hemorrhoids.  The scope was straightened and readvanced a short ways up the left side of the colon.  Air was suctioned, the scope removed. The patient tolerated the procedure well.  There was no obvious immediate complication.  ENDOSCOPIC DIAGNOSES: 1. Internal and external hemorrhoids. 2. Left-sided diverticula. 3. Three sigmoid polyps, two tiny were not biopsied, one snare and hot    biopsied on the base. 4. Three descending transverse small polyps hot biopsied, one cecal tiny cold    biopsied, put in the first container. 5. Otherwise within normal limits to the cecum.  PLAN:  Await pathology to determine future colonic screening.  Ten day customary postpolypectomy instructions, follow up with me p.r.n., otherwise return care to Dr. Clarene Duke for the customary health care maintenance to include yearly rectals and guaiacs. Attending Physician:  Nelda Marseille DD:  03/11/01 TD:  03/12/01 Job: [id number]

## 2010-11-17 ENCOUNTER — Emergency Department (HOSPITAL_COMMUNITY): Payer: Medicare Other

## 2010-11-17 ENCOUNTER — Emergency Department (HOSPITAL_COMMUNITY)
Admission: EM | Admit: 2010-11-17 | Discharge: 2010-11-17 | Disposition: A | Discharge status: Home / Self Care | Payer: Medicare Other | Attending: Emergency Medicine | Admitting: Emergency Medicine

## 2010-11-17 DIAGNOSIS — D72819 Decreased white blood cell count, unspecified: Secondary | ICD-10-CM | POA: Insufficient documentation

## 2010-11-17 DIAGNOSIS — R110 Nausea: Secondary | ICD-10-CM | POA: Insufficient documentation

## 2010-11-17 DIAGNOSIS — R55 Syncope and collapse: Secondary | ICD-10-CM | POA: Insufficient documentation

## 2010-11-17 DIAGNOSIS — I4891 Unspecified atrial fibrillation: Secondary | ICD-10-CM | POA: Insufficient documentation

## 2010-11-17 LAB — CBC
HCT: 40.5 % (ref 36.0–46.0)
MCH: 29.8 pg (ref 26.0–34.0)
MCV: 88 fL (ref 78.0–100.0)
Platelets: 165 10*3/uL (ref 150–400)
RBC: 4.6 MIL/uL (ref 3.87–5.11)

## 2010-11-17 LAB — URINALYSIS, ROUTINE W REFLEX MICROSCOPIC
Ketones, ur: NEGATIVE mg/dL
Nitrite: NEGATIVE
Protein, ur: NEGATIVE mg/dL

## 2010-11-17 LAB — DIFFERENTIAL
Eosinophils Absolute: 0 10*3/uL (ref 0.0–0.7)
Eosinophils Relative: 0 % (ref 0–5)
Lymphs Abs: 0.6 10*3/uL — ABNORMAL LOW (ref 0.7–4.0)
Monocytes Relative: 10 % (ref 3–12)
Neutrophils Relative %: 66 % (ref 43–77)

## 2010-11-17 LAB — BASIC METABOLIC PANEL
CO2: 27 mEq/L (ref 19–32)
Calcium: 8.6 mg/dL (ref 8.4–10.5)
Chloride: 102 mEq/L (ref 96–112)
GFR calc Af Amer: >60 mL/min (ref >60)
GFR calc non Af Amer: >60 mL/min (ref >60)
Potassium: 4 mEq/L (ref 3.5–5.1)

## 2010-11-17 LAB — TROPONIN I: Troponin I: <0.3 ng/mL (ref <0.30)

## 2010-11-21 ENCOUNTER — Other Ambulatory Visit: Payer: Self-pay | Admitting: Family Medicine

## 2010-11-21 DIAGNOSIS — R7989 Other specified abnormal findings of blood chemistry: Secondary | ICD-10-CM

## 2010-11-25 ENCOUNTER — Ambulatory Visit
Admission: RE | Admit: 2010-11-25 | Discharge: 2010-11-25 | Disposition: A | Discharge status: Home / Self Care | Payer: Medicare Other | Source: Ambulatory Visit | Attending: Family Medicine | Admitting: Family Medicine

## 2010-11-25 ENCOUNTER — Other Ambulatory Visit: Payer: Self-pay | Admitting: Family Medicine

## 2010-11-25 DIAGNOSIS — R748 Abnormal levels of other serum enzymes: Secondary | ICD-10-CM

## 2010-11-25 DIAGNOSIS — R7989 Other specified abnormal findings of blood chemistry: Secondary | ICD-10-CM

## 2010-12-06 ENCOUNTER — Ambulatory Visit
Admission: RE | Admit: 2010-12-06 | Discharge: 2010-12-06 | Disposition: A | Discharge status: Home / Self Care | Payer: Medicare Other | Source: Ambulatory Visit | Attending: Family Medicine | Admitting: Family Medicine

## 2010-12-06 DIAGNOSIS — R748 Abnormal levels of other serum enzymes: Secondary | ICD-10-CM

## 2010-12-06 MED ORDER — GADOBENATE DIMEGLUMINE 529 MG/ML IV SOLN
20.0000 mL | Freq: Once | INTRAVENOUS | Status: AC | PRN
  Administered 2010-12-06: 20 mL via INTRAVENOUS

## 2011-11-12 ENCOUNTER — Other Ambulatory Visit: Payer: Self-pay | Admitting: Dermatology

## 2012-02-11 ENCOUNTER — Other Ambulatory Visit: Payer: Self-pay | Admitting: Dermatology

## 2012-07-13 ENCOUNTER — Other Ambulatory Visit: Payer: Self-pay | Admitting: Dermatology

## 2012-10-14 ENCOUNTER — Other Ambulatory Visit: Payer: Self-pay | Admitting: Dermatology

## 2013-02-14 ENCOUNTER — Ambulatory Visit (INDEPENDENT_AMBULATORY_CARE_PROVIDER_SITE_OTHER): Payer: Medicare Other | Admitting: Licensed Clinical Social Worker

## 2013-02-14 DIAGNOSIS — F411 Generalized anxiety disorder: Secondary | ICD-10-CM

## 2013-02-21 ENCOUNTER — Ambulatory Visit (INDEPENDENT_AMBULATORY_CARE_PROVIDER_SITE_OTHER): Payer: Medicare Other | Admitting: Licensed Clinical Social Worker

## 2013-02-21 DIAGNOSIS — F411 Generalized anxiety disorder: Secondary | ICD-10-CM

## 2013-03-02 ENCOUNTER — Ambulatory Visit (INDEPENDENT_AMBULATORY_CARE_PROVIDER_SITE_OTHER): Payer: Medicare Other | Admitting: Licensed Clinical Social Worker

## 2013-03-02 DIAGNOSIS — F411 Generalized anxiety disorder: Secondary | ICD-10-CM

## 2013-03-09 ENCOUNTER — Ambulatory Visit (INDEPENDENT_AMBULATORY_CARE_PROVIDER_SITE_OTHER): Payer: Medicare Other | Admitting: Licensed Clinical Social Worker

## 2013-03-09 DIAGNOSIS — F411 Generalized anxiety disorder: Secondary | ICD-10-CM

## 2013-03-16 ENCOUNTER — Ambulatory Visit (INDEPENDENT_AMBULATORY_CARE_PROVIDER_SITE_OTHER): Payer: Medicare Other | Admitting: Licensed Clinical Social Worker

## 2013-03-16 DIAGNOSIS — F411 Generalized anxiety disorder: Secondary | ICD-10-CM

## 2013-03-23 ENCOUNTER — Ambulatory Visit (INDEPENDENT_AMBULATORY_CARE_PROVIDER_SITE_OTHER): Payer: Medicare Other | Admitting: Licensed Clinical Social Worker

## 2013-03-23 DIAGNOSIS — F411 Generalized anxiety disorder: Secondary | ICD-10-CM

## 2013-03-30 ENCOUNTER — Ambulatory Visit (INDEPENDENT_AMBULATORY_CARE_PROVIDER_SITE_OTHER): Payer: Medicare Other | Admitting: Licensed Clinical Social Worker

## 2013-03-30 DIAGNOSIS — F411 Generalized anxiety disorder: Secondary | ICD-10-CM

## 2013-04-06 ENCOUNTER — Ambulatory Visit (INDEPENDENT_AMBULATORY_CARE_PROVIDER_SITE_OTHER): Payer: Medicare Other | Admitting: Licensed Clinical Social Worker

## 2013-04-06 DIAGNOSIS — F411 Generalized anxiety disorder: Secondary | ICD-10-CM

## 2013-04-13 ENCOUNTER — Ambulatory Visit (INDEPENDENT_AMBULATORY_CARE_PROVIDER_SITE_OTHER): Payer: Medicare Other | Admitting: Licensed Clinical Social Worker

## 2013-04-13 DIAGNOSIS — F411 Generalized anxiety disorder: Secondary | ICD-10-CM

## 2013-04-18 ENCOUNTER — Ambulatory Visit (INDEPENDENT_AMBULATORY_CARE_PROVIDER_SITE_OTHER): Payer: Medicare Other | Admitting: Licensed Clinical Social Worker

## 2013-04-18 DIAGNOSIS — F411 Generalized anxiety disorder: Secondary | ICD-10-CM

## 2013-04-27 ENCOUNTER — Ambulatory Visit (INDEPENDENT_AMBULATORY_CARE_PROVIDER_SITE_OTHER): Payer: Medicare Other | Admitting: Licensed Clinical Social Worker

## 2013-04-27 DIAGNOSIS — F411 Generalized anxiety disorder: Secondary | ICD-10-CM

## 2013-05-04 ENCOUNTER — Ambulatory Visit (INDEPENDENT_AMBULATORY_CARE_PROVIDER_SITE_OTHER): Payer: Medicare Other | Admitting: Licensed Clinical Social Worker

## 2013-05-04 DIAGNOSIS — F411 Generalized anxiety disorder: Secondary | ICD-10-CM

## 2013-05-11 ENCOUNTER — Ambulatory Visit: Payer: Medicare Other | Admitting: Licensed Clinical Social Worker

## 2013-05-16 ENCOUNTER — Ambulatory Visit (INDEPENDENT_AMBULATORY_CARE_PROVIDER_SITE_OTHER): Payer: Medicare Other | Admitting: Licensed Clinical Social Worker

## 2013-05-16 DIAGNOSIS — F411 Generalized anxiety disorder: Secondary | ICD-10-CM

## 2013-05-27 ENCOUNTER — Ambulatory Visit (INDEPENDENT_AMBULATORY_CARE_PROVIDER_SITE_OTHER): Payer: Medicare Other | Admitting: Licensed Clinical Social Worker

## 2013-05-27 DIAGNOSIS — F411 Generalized anxiety disorder: Secondary | ICD-10-CM

## 2013-06-01 ENCOUNTER — Ambulatory Visit (INDEPENDENT_AMBULATORY_CARE_PROVIDER_SITE_OTHER): Payer: Medicare Other | Admitting: Licensed Clinical Social Worker

## 2013-06-01 DIAGNOSIS — F411 Generalized anxiety disorder: Secondary | ICD-10-CM

## 2013-06-08 ENCOUNTER — Ambulatory Visit (INDEPENDENT_AMBULATORY_CARE_PROVIDER_SITE_OTHER): Payer: Medicare Other | Admitting: Licensed Clinical Social Worker

## 2013-06-08 DIAGNOSIS — F411 Generalized anxiety disorder: Secondary | ICD-10-CM

## 2013-06-15 ENCOUNTER — Ambulatory Visit (INDEPENDENT_AMBULATORY_CARE_PROVIDER_SITE_OTHER): Payer: Medicare Other | Admitting: Licensed Clinical Social Worker

## 2013-06-15 DIAGNOSIS — F411 Generalized anxiety disorder: Secondary | ICD-10-CM

## 2013-06-22 ENCOUNTER — Ambulatory Visit (INDEPENDENT_AMBULATORY_CARE_PROVIDER_SITE_OTHER): Payer: Medicare Other | Admitting: Licensed Clinical Social Worker

## 2013-06-22 DIAGNOSIS — F411 Generalized anxiety disorder: Secondary | ICD-10-CM

## 2013-06-29 ENCOUNTER — Ambulatory Visit (INDEPENDENT_AMBULATORY_CARE_PROVIDER_SITE_OTHER): Payer: Medicare Other | Admitting: Licensed Clinical Social Worker

## 2013-06-29 DIAGNOSIS — F411 Generalized anxiety disorder: Secondary | ICD-10-CM

## 2013-07-06 ENCOUNTER — Ambulatory Visit (INDEPENDENT_AMBULATORY_CARE_PROVIDER_SITE_OTHER): Payer: Medicare Other | Admitting: Licensed Clinical Social Worker

## 2013-07-06 DIAGNOSIS — F411 Generalized anxiety disorder: Secondary | ICD-10-CM

## 2013-07-13 ENCOUNTER — Ambulatory Visit (INDEPENDENT_AMBULATORY_CARE_PROVIDER_SITE_OTHER): Payer: Medicare Other | Admitting: Licensed Clinical Social Worker

## 2013-07-13 DIAGNOSIS — F411 Generalized anxiety disorder: Secondary | ICD-10-CM

## 2013-07-15 ENCOUNTER — Encounter (HOSPITAL_COMMUNITY): Payer: Self-pay | Admitting: Emergency Medicine

## 2013-07-15 ENCOUNTER — Inpatient Hospital Stay (HOSPITAL_COMMUNITY)
Admission: EM | Admit: 2013-07-15 | Discharge: 2013-07-16 | DRG: 310 | Disposition: A | Discharge status: Home / Self Care | Payer: Medicare Other | Attending: Family Medicine | Admitting: Family Medicine

## 2013-07-15 DIAGNOSIS — A088 Other specified intestinal infections: Secondary | ICD-10-CM | POA: Diagnosis present

## 2013-07-15 DIAGNOSIS — K529 Noninfective gastroenteritis and colitis, unspecified: Secondary | ICD-10-CM | POA: Diagnosis present

## 2013-07-15 DIAGNOSIS — Z7982 Long term (current) use of aspirin: Secondary | ICD-10-CM

## 2013-07-15 DIAGNOSIS — Z79899 Other long term (current) drug therapy: Secondary | ICD-10-CM

## 2013-07-15 DIAGNOSIS — K5289 Other specified noninfective gastroenteritis and colitis: Secondary | ICD-10-CM

## 2013-07-15 DIAGNOSIS — I519 Heart disease, unspecified: Secondary | ICD-10-CM

## 2013-07-15 DIAGNOSIS — Z96659 Presence of unspecified artificial knee joint: Secondary | ICD-10-CM

## 2013-07-15 DIAGNOSIS — H3530 Unspecified macular degeneration: Secondary | ICD-10-CM | POA: Diagnosis present

## 2013-07-15 DIAGNOSIS — I4891 Unspecified atrial fibrillation: Principal | ICD-10-CM | POA: Diagnosis present

## 2013-07-15 DIAGNOSIS — E860 Dehydration: Secondary | ICD-10-CM | POA: Diagnosis present

## 2013-07-15 HISTORY — DX: Unspecified macular degeneration: H35.30

## 2013-07-15 HISTORY — DX: Unspecified atrial fibrillation: I48.91

## 2013-07-15 HISTORY — DX: Allergy, unspecified, initial encounter: T78.40XA

## 2013-07-15 LAB — COMPREHENSIVE METABOLIC PANEL
ALK PHOS: 95 U/L (ref 39–117)
ALT: 40 U/L — AB (ref 0–35)
AST: 49 U/L — AB (ref 0–37)
Albumin: 3.2 g/dL — ABNORMAL LOW (ref 3.5–5.2)
BILIRUBIN TOTAL: 0.4 mg/dL (ref 0.3–1.2)
BUN: 17 mg/dL (ref 6–23)
CHLORIDE: 102 meq/L (ref 96–112)
CO2: 20 meq/L (ref 19–32)
Calcium: 8.7 mg/dL (ref 8.4–10.5)
Creatinine, Ser: 0.68 mg/dL (ref 0.50–1.10)
GFR calc Af Amer: >90 mL/min (ref >90)
GFR calc non Af Amer: 85 mL/min — ABNORMAL LOW (ref >90)
Glucose, Bld: 113 mg/dL — ABNORMAL HIGH (ref 70–99)
Potassium: 4.2 mEq/L (ref 3.7–5.3)
Sodium: 141 mEq/L (ref 137–147)
Total Protein: 6.5 g/dL (ref 6.0–8.3)

## 2013-07-15 LAB — CBC WITH DIFFERENTIAL/PLATELET
Basophils Absolute: 0 10*3/uL (ref 0.0–0.1)
Basophils Relative: 0 % (ref 0–1)
EOS PCT: 1 % (ref 0–5)
Eosinophils Absolute: 0.1 10*3/uL (ref 0.0–0.7)
HEMATOCRIT: 48.2 % — AB (ref 36.0–46.0)
HEMOGLOBIN: 16.3 g/dL — AB (ref 12.0–15.0)
LYMPHS ABS: 0.3 10*3/uL — AB (ref 0.7–4.0)
Lymphocytes Relative: 4 % — ABNORMAL LOW (ref 12–46)
MCH: 30.6 pg (ref 26.0–34.0)
MCHC: 33.8 g/dL (ref 30.0–36.0)
MCV: 90.4 fL (ref 78.0–100.0)
Monocytes Absolute: 0.4 10*3/uL (ref 0.1–1.0)
Monocytes Relative: 4 % (ref 3–12)
Neutro Abs: 8.3 10*3/uL — ABNORMAL HIGH (ref 1.7–7.7)
Neutrophils Relative %: 91 % — ABNORMAL HIGH (ref 43–77)
Platelets: 199 10*3/uL (ref 150–400)
RBC: 5.33 MIL/uL — AB (ref 3.87–5.11)
RDW: 13.9 % (ref 11.5–15.5)
WBC: 9.1 10*3/uL (ref 4.0–10.5)

## 2013-07-15 LAB — URINALYSIS, ROUTINE W REFLEX MICROSCOPIC
Bilirubin Urine: NEGATIVE
GLUCOSE, UA: NEGATIVE mg/dL
Hgb urine dipstick: NEGATIVE
KETONES UR: NEGATIVE mg/dL
LEUKOCYTES UA: NEGATIVE
Nitrite: NEGATIVE
PH: 7.5 (ref 5.0–8.0)
Protein, ur: NEGATIVE mg/dL
Specific Gravity, Urine: 1.015 (ref 1.005–1.030)
Urobilinogen, UA: 0.2 mg/dL (ref 0.0–1.0)

## 2013-07-15 LAB — I-STAT CG4 LACTIC ACID, ED: Lactic Acid, Venous: 2.08 mmol/L (ref 0.5–2.2)

## 2013-07-15 LAB — MRSA PCR SCREENING: MRSA by PCR: NEGATIVE

## 2013-07-15 LAB — HEPARIN LEVEL (UNFRACTIONATED): Heparin Unfractionated: 0.14 IU/mL — ABNORMAL LOW (ref 0.30–0.70)

## 2013-07-15 MED ORDER — ONDANSETRON HCL 4 MG/2ML IJ SOLN
4.0000 mg | Freq: Once | INTRAMUSCULAR | Status: AC
  Administered 2013-07-15: 4 mg via INTRAMUSCULAR
  Filled 2013-07-15: qty 2

## 2013-07-15 MED ORDER — ONDANSETRON HCL 4 MG/2ML IJ SOLN: 4.0000 mg | Freq: Four times a day (QID) | INTRAMUSCULAR | Status: DC | PRN

## 2013-07-15 MED ORDER — SODIUM CHLORIDE 0.9 % IJ SOLN
3.0000 mL | Freq: Two times a day (BID) | INTRAMUSCULAR | Status: DC
  Administered 2013-07-15: 3 mL via INTRAVENOUS

## 2013-07-15 MED ORDER — SODIUM CHLORIDE 0.9 % IV SOLN
1000.0000 mL | Freq: Once | INTRAVENOUS | Status: AC
  Administered 2013-07-15: 1000 mL via INTRAVENOUS

## 2013-07-15 MED ORDER — HEPARIN BOLUS VIA INFUSION
2000.0000 [IU] | Freq: Once | INTRAVENOUS | Status: AC
  Administered 2013-07-15: 2000 [IU] via INTRAVENOUS
  Filled 2013-07-15: qty 2000

## 2013-07-15 MED ORDER — HEPARIN (PORCINE) IN NACL 100-0.45 UNIT/ML-% IJ SOLN
900.0000 [IU]/h | INTRAMUSCULAR | Status: DC
  Administered 2013-07-15 (×2): 900 [IU]/h via INTRAVENOUS
  Filled 2013-07-15: qty 250

## 2013-07-15 MED ORDER — METOPROLOL TARTRATE 25 MG PO TABS
25.0000 mg | ORAL_TABLET | Freq: Two times a day (BID) | ORAL | Status: DC
  Administered 2013-07-15 – 2013-07-16 (×2): 25 mg via ORAL
  Filled 2013-07-15 (×3): qty 1

## 2013-07-15 MED ORDER — SODIUM CHLORIDE 0.9 % IV SOLN
1000.0000 mL | INTRAVENOUS | Status: DC
  Administered 2013-07-15: 1000 mL via INTRAVENOUS

## 2013-07-15 MED ORDER — SODIUM CHLORIDE 0.9 % IV BOLUS (SEPSIS)
1000.0000 mL | Freq: Once | INTRAVENOUS | Status: AC
  Administered 2013-07-15: 1000 mL via INTRAVENOUS

## 2013-07-15 MED ORDER — HEPARIN (PORCINE) IN NACL 100-0.45 UNIT/ML-% IJ SOLN
1100.0000 [IU]/h | INTRAMUSCULAR | Status: DC
  Administered 2013-07-15 – 2013-07-16 (×2): 1100 [IU]/h via INTRAVENOUS
  Filled 2013-07-15: qty 250

## 2013-07-15 MED ORDER — KCL IN DEXTROSE-NACL 20-5-0.45 MEQ/L-%-% IV SOLN
INTRAVENOUS | Status: DC
  Administered 2013-07-15: 14:00:00 via INTRAVENOUS
  Administered 2013-07-15: 100 mL/h via INTRAVENOUS
  Filled 2013-07-15 (×5): qty 1000

## 2013-07-15 MED ORDER — HEPARIN BOLUS VIA INFUSION
3000.0000 [IU] | Freq: Once | INTRAVENOUS | Status: AC
  Administered 2013-07-15: 3000 [IU] via INTRAVENOUS
  Filled 2013-07-15: qty 3000

## 2013-07-15 MED ORDER — ONDANSETRON HCL 4 MG PO TABS: 4.0000 mg | ORAL_TABLET | Freq: Four times a day (QID) | ORAL | Status: DC | PRN

## 2013-07-15 MED ORDER — METOPROLOL TARTRATE 1 MG/ML IV SOLN
2.5000 mg | Freq: Once | INTRAVENOUS | Status: AC
  Administered 2013-07-15: 2.5 mg via INTRAVENOUS
  Filled 2013-07-15: qty 5

## 2013-07-15 NOTE — ED Provider Notes (Signed)
CSN: [id number]     Arrival date & time 07/15/13  7829 History   First MD Initiated Contact with Patient 07/15/13 0503     Chief Complaint  Patient presents with  . Nausea  . Vomiting     (Consider location/radiation/quality/duration/timing/severity/associated sxs/prior Treatment) HPI 73 year old female presents to emergency room from home with complaint of nausea, vomiting, and diarrhea, starting last night around 10 PM.  She reports multiple episodes of both.  Patient has history of paroxysmal A. fib for which she takes diltiazem.  Patient takes aspirin for anticoagulation.  She is not on Coumadin, or other anticoagulation.  No sick contacts, no travel, no unusual foods.  No fever.  No abdominal pain.  Patient reports after having multiple episodes of nausea, vomiting, and diarrhea.  She felt weak and felt that she was in atrial fibrillation.  She reports that she can feel her self go into atrial fibrillation several times a week.  She normally tries to stay hydrated and rest when she has these episodes.  She denies any near syncope feelings.  No chest pain, no shortness of breath. Past Medical History  Diagnosis Date  . A-fib   . Macular degeneration   . Allergy    Past Surgical History  Procedure Laterality Date  . Replacement total knee bilateral     No family history on file. History  Substance Use Topics  . Smoking status: Never Smoker   . Smokeless tobacco: Not on file  . Alcohol Use: No   OB History   Grav Para Term Preterm Abortions TAB SAB Ect Mult Living                 Review of Systems    See History of Present Illness; otherwise all other systems are reviewed and negative Allergies  Condrolite and Glucosamine forte  Home Medications   Current Outpatient Rx  Name  Route  Sig  Dispense  Refill  . aspirin EC 325 MG tablet   Oral   Take 325 mg by mouth daily.         Marland Kitchen diltiazem (DILACOR XR) 180 MG 24 hr capsule   Oral   Take 180 mg by mouth daily.          . Multiple Vitamin (MULTIVITAMIN WITH MINERALS) TABS tablet   Oral   Take 1 tablet by mouth daily.         . ranibizumab (LUCENTIS) 0.5 MG/0.05ML SOLN   Intravitreal   0.5 mg by Intravitreal route once. Every 7 weeks          BP 99/65  Pulse 72  Temp(Src) 99 F (37.2 C) (Oral)  Resp 17  Ht 5\' 3"  (1.6 m)  Wt 185 lb (83.915 kg)  BMI 32.78 kg/m2  SpO2 95% Physical Exam  Constitutional: She is oriented to person, place, and time. She appears well-developed and well-nourished. No distress.  HENT:  Head: Normocephalic and atraumatic.  Nose: Nose normal.  Mouth/Throat: No oropharyngeal exudate.  Dry mucous membrane  Eyes: Conjunctivae and EOM are normal. Pupils are equal, round, and reactive to light.  Neck: Normal range of motion. Neck supple. No JVD present. No tracheal deviation present. No thyromegaly present.  Cardiovascular: Normal rate, normal heart sounds and intact distal pulses.  Exam reveals no gallop and no friction rub.   No murmur heard. Tachycardia with irregular rate  Pulmonary/Chest: Effort normal and breath sounds normal. No stridor. No respiratory distress. She has no wheezes. She has no rales.  She exhibits no tenderness.  Abdominal: She exhibits no mass. There is no tenderness. There is no rebound and no guarding.  Hyperactive bowel sounds  Musculoskeletal: Normal range of motion. She exhibits no edema and no tenderness.  Lymphadenopathy:    She has no cervical adenopathy.  Neurological: She is alert and oriented to person, place, and time. She exhibits normal muscle tone. Coordination normal.  Skin: Skin is warm and dry. No rash noted. No erythema. No pallor.    ED Course  Procedures (including critical care time) Labs Review Labs Reviewed  CBC WITH DIFFERENTIAL - Abnormal; Notable for the following:    RBC 5.33 (*)    Hemoglobin 16.3 (*)    HCT 48.2 (*)    Neutrophils Relative % 91 (*)    Neutro Abs 8.3 (*)    Lymphocytes Relative 4 (*)     Lymphs Abs 0.3 (*)    All other components within normal limits  URINALYSIS, ROUTINE W REFLEX MICROSCOPIC  COMPREHENSIVE METABOLIC PANEL  I-STAT CG4 LACTIC ACID, ED   Imaging Review No results found.  EKG Interpretation    Date/Time:  Friday July 15 2013 04:58:10 EST Ventricular Rate:  159 PR Interval:    QRS Duration: 88 QT Interval:  298 QTC Calculation: 485 R Axis:   50 Text Interpretation:  Atrial fibrillation with rapid V-rate Ventricular premature complex Low voltage, precordial leads Repolarization abnormality, prob rate related afib new from prior but has been present on prior ekgs. Confirmed by Alyza Artiaga  MD, Tiyana Galla [phone]) on 07/15/2013 5:29:10 AM            MDM   Final diagnoses:  None    73 year old female with nausea, vomiting, diarrhea, probable viral gastroenteritis.  She appears dehydrated on exam.  She is A. fib with RVR, but this is most likely secondary to dehydration.  We'll give IV fluids and reassess for need for IV  diltiazem  8:05 AM Care passed to Dr Oletta Cohn, cardiology to see in consult.  Olivia Mackie, MD 07/15/13 [phone]

## 2013-07-15 NOTE — H&P (Signed)
[facility] History and Physical  Rebecca Short HYI:[id number] DOB: 24-Apr-1941 DOA: 07/15/2013  Referring physician: Dr. Blinda Leatherwood PCP: Mickie Hillier, MD  Specialist: Dr. Mayford Knife  Chief Complaint: Atrial fibrillation with RVR  HPI: Rebecca Short is a 73 y.o. female  With history of atrial fibrillation on aspirin at home. States that yesterday evening she developed some abdominal discomfort after eating dinner. Subsequently vomited 4 times. Her oral intake has been poor since. Nothing she is aware of makes it better or worse. She denies any sick contacts. Denies any fevers or chills. The emesis was described as nonbloody non-bilious and consisted of mainly the meal she had eaten prior. States that during her emesis she felt her heart beating fast. She denies any chest discomfort  While in the ED patient was found to have an elevated heart rate ranging from 72-150. Cardiology was consulted who subsequently recommended medical admissions for suspected gastroenteritis   Review of Systems:  14 point review of systems reviewed and negative unless otherwise mentioned above  Past Medical History  Diagnosis Date  . A-fib   . Macular degeneration   . Allergy    Past Surgical History  Procedure Laterality Date  . Replacement total knee bilateral     Social History:  reports that she has never smoked. She does not have any smokeless tobacco history on file. She reports that she does not drink alcohol or use illicit drugs.  Allergies  Allergen Reactions  . Condrolite [Glucosamine-Chondroitin-Msm] Rash  . Glucosamine Forte [Nutritional Supplements] Rash    No family history on file.   Prior to Admission medications   Medication Sig Start Date End Date Taking? Authorizing Provider  aspirin EC 325 MG tablet Take 325 mg by mouth daily.   Yes Historical Provider, MD  diltiazem (DILACOR XR) 180 MG 24 hr capsule Take 180 mg by mouth daily.   Yes Historical Provider, MD  Multiple  Vitamin (MULTIVITAMIN WITH MINERALS) TABS tablet Take 1 tablet by mouth daily.   Yes Historical Provider, MD  ranibizumab (LUCENTIS) 0.5 MG/0.05ML SOLN 0.5 mg by Intravitreal route once. Every 7 weeks   Yes Historical Provider, MD   Physical Exam: Filed Vitals:   07/15/13 0845  BP: 119/84  Pulse: 156  Temp:   Resp: 20    BP 119/84  Pulse 156  Temp(Src) 98.5 F (36.9 C) (Oral)  Resp 20  Ht 5\' 3"  (1.6 m)  Wt 83.915 kg (185 lb)  BMI 32.78 kg/m2  SpO2 92%  General:  Appears calm and comfortable Eyes: PERRL, normal lids, irises & conjunctiva ENT: grossly normal hearing, lips & tongue, dry mucous membranes Neck: no LAD, masses or thyromegaly Cardiovascular: Irregularly irregular with rapid rate ventricular response, no m/r/g. No LE edema. Telemetry: Irregularly irregular with rapid ventricular response  Respiratory: CTA bilaterally, no w/r/r. Normal respiratory effort. Abdomen: soft, nt, nd Skin: no rash or induration seen on limited exam Musculoskeletal: grossly normal tone BUE/BLE Psychiatric: grossly normal mood and affect, speech fluent and appropriate Neurologic: grossly non-focal.          Labs on Admission:  Basic Metabolic Panel:  Recent Labs Lab 07/15/13 0546  NA 141  K 4.2  CL 102  CO2 20  GLUCOSE 113*  BUN 17  CREATININE 0.68  CALCIUM 8.7   Liver Function Tests:  Recent Labs Lab 07/15/13 0546  AST 49*  ALT 40*  ALKPHOS 95  BILITOT 0.4  PROT 6.5  ALBUMIN 3.2*   No results found for this  basename: LIPASE, AMYLASE,  in the last 168 hours No results found for this basename: AMMONIA,  in the last 168 hours CBC:  Recent Labs Lab 07/15/13 0546  WBC 9.1  NEUTROABS 8.3*  HGB 16.3*  HCT 48.2*  MCV 90.4  PLT 199   Cardiac Enzymes: No results found for this basename: CKTOTAL, CKMB, CKMBINDEX, TROPONINI,  in the last 168 hours  BNP (last 3 results) No results found for this basename: PROBNP,  in the last 8760 hours CBG: No results found for  this basename: GLUCAP,  in the last 168 hours  Radiological Exams on Admission: No results found.  EKG: Independently reviewed. Atrial fibrillation with RVR  Assessment/Plan Principal Problem:   Atrial fibrillation with RVR - admission to Step down - Cardiology involved and I will defer management to them at this point. - Agree with Heparin per pharmacy consult for anticoagulation   Active Problems:   Acute gastroenteritis - Supportive therapy: anti emetics and MIVF's - clear liquid diet - Differential diagnoses include food poisoning. No bloody diarrhea no fevers as such we'll not start antibiotics.  DVT prophylaxis Heparin per pharmacy for treatment of A. fib  Code Status: full Family Communication: Discussed with patient and spouse at bedside Disposition Plan: Pending recommendations from Cardiology given that Afib with RVR is primary problem  Time spent:> 35 minutes  Penny Pia [facility] Pager [phone]

## 2013-07-15 NOTE — ED Provider Notes (Signed)
Angiocath insertion Performed by: Gilda Crease  Consent: Verbal consent obtained. Risks and benefits: risks, benefits and alternatives were discussed Time out: Immediately prior to procedure a "time out" was called to verify the correct patient, procedure, equipment, support staff and site/side marked as required.  Preparation: Patient was prepped and draped in the usual sterile fashion.  Vein Location: right antercubital fossa  Ultrasound Guided  Gauge: 20G  Normal blood return and flush without difficulty Patient tolerance: Patient tolerated the procedure well with no immediate complications.     Gilda Crease, MD 07/15/13 [phone]

## 2013-07-15 NOTE — ED Notes (Signed)
Pt. Converted to Sinus Rythmn, ECG completed, shown to Dr. Blinda Leatherwood, paged Dr. Mayford Knife

## 2013-07-15 NOTE — Consult Note (Signed)
Admit date: 07/15/2013 Referring Physician Dr. Blinda Leatherwood Primary Cardiologist Dr. Eldridge Dace Chief complaint/reason for admission: afib with RVR  HPI: This is a 73yo WF with a history of PAF followed by Dr. Eldridge Dace who presented to the ER with nausea/vomiting and diarrhea.  The N/V/D started last PM around 10PM.  She got worried that she was getting dehydrated and went to the ER.  She denies and fever or abdominal pain but did have some chills.  She denies any chest pain, SOB, DOE, LE edema, palpitations or syncope.  She did get dizzy after getting up and going to the bathroom.    PMH:    Past Medical History  Diagnosis Date  . A-fib   . Macular degeneration   . Allergy     PSH:    Past Surgical History  Procedure Laterality Date  . Replacement total knee bilateral      ALLERGIES:   Condrolite and Glucosamine forte  Prior to Admit Meds:   (Not in a hospital admission) Family HX:   No family history on file. Social HX:    History   Social History  . Marital Status: Married    Spouse Name: N/A    Number of Children: N/A  . Years of Education: N/A   Occupational History  . Not on file.   Social History Main Topics  . Smoking status: Never Smoker   . Smokeless tobacco: Not on file  . Alcohol Use: No  . Drug Use: No  . Sexual Activity: Not on file   Other Topics Concern  . Not on file   Social History Narrative  . No narrative on file     ROS:  All 11 ROS were addressed and are negative except what is stated in the HPI  PHYSICAL EXAM Filed Vitals:   07/15/13 0813  BP: 90/51  Pulse: 148  Temp: 98.5 F (36.9 C)  Resp: 18   General: Well developed, well nourished, in no acute distress Head: Eyes PERRLA, No xanthomas.   Normal cephalic and atramatic  Lungs:   Clear bilaterally to auscultation and percussion. Heart:   Irregularly irregular S1 S2 Pulses are 2+ & equal.            No carotid bruit. No JVD.  No abdominal bruits. No femoral bruits. Abdomen: Bowel  sounds are positive, abdomen soft and non-tender without masses  Extremities:   No clubbing, cyanosis or edema.  DP +1 Neuro: Alert and oriented X 3. Psych:  Good affect, responds appropriately   Labs:   Lab Results  Component Value Date   WBC 9.1 07/15/2013   HGB 16.3* 07/15/2013   HCT 48.2* 07/15/2013   MCV 90.4 07/15/2013   PLT 199 07/15/2013    Recent Labs Lab 07/15/13 0546  NA 141  K 4.2  CL 102  CO2 20  BUN 17  CREATININE 0.68  CALCIUM 8.7  PROT 6.5  BILITOT 0.4  ALKPHOS 95  ALT 40*  AST 49*  GLUCOSE 113*   Lab Results  Component Value Date   CKTOTAL 26 11/17/2010   CKMB 1.1 11/17/2010   TROPONINI <0.30 11/17/2010   No results found for this basename: PTT   Lab Results  Component Value Date   INR 1.63* 01/12/2010   INR 1.73* 01/11/2010   INR 1.67* 01/10/2010         Radiology:  Mild basilar atelectasis; mild cardiomegaly noted.   EKG:  Atrial fibrillation with RVR and rSR'  ASSESSMENT:  1.  ?  Acute viral gastroenteritis vs. Related to food she ate last PM - apparently she prepared something she had not had before with a lot of spices in it.  She did have chills prior to N/V starting.   2.  Dehydration secondary to #1 3.  Atrial fibrillation with RVR - with a history of PAF in the past (CHADS VASC score 2 - female/ate>65).  Most likely triggered by acute GI illness.  She has not been on anticoagulation in the past except ASA.  PLAN:   1.  IVF hydration 2.  Will try low dose Lopressor IV to try to slow HR down.  She is borderline hypotensive so I do not think she will tolerate IV Cardizem at present. 3.  Check 2D echo once HR slows down 4.  IV Heparin gtt for now 5.  Will need to be on anticoagulants for now.  If she converts to NSR then will need to determine longterm need for anticoagulation.  Her CHADS VASC is 2 but mainly due to being a female over the age of 73 and this event was in the setting of an acute viral illness.  Not certain she needs long term  anticoagulation if she converts to NSR unless she has a reoccurence.    Quintella Reichert, MD  07/15/2013  8:33 AM

## 2013-07-15 NOTE — ED Notes (Signed)
Reported to Dr. Lorrine Kin and Florentina Addison from Cardiology that pt. Has converted from A-fib to NSR

## 2013-07-15 NOTE — Progress Notes (Signed)
ANTICOAGULATION CONSULT NOTE - Follow Up Consult  Pharmacy Consult for heparin Indication: atrial fibrillation  Allergies  Allergen Reactions  . Condrolite [Glucosamine-Chondroitin-Msm] Rash  . Glucosamine Forte [Nutritional Supplements] Rash    Patient Measurements: Height: 5\' 3"  (160 cm) Weight: 192 lb 10.9 oz (87.4 kg) IBW/kg (Calculated) : 52.4 Heparin Dosing Weight: 63kg  Vital Signs: Temp: 98.7 F (37.1 C) (02/20 1600) Temp src: Oral (02/20 1600) BP: 122/56 mmHg (02/20 1600) Pulse Rate: 92 (02/20 1600)  Labs:  Recent Labs  07/15/13 0546 07/15/13 1645  HGB 16.3*  --   HCT 48.2*  --   PLT 199  --   HEPARINUNFRC  --  0.14*  CREATININE 0.68  --     Estimated Creatinine Clearance: 65.7 ml/min (by C-G formula based on Cr of 0.68).   Medications:  Scheduled:  . metoprolol tartrate  25 mg Oral BID  . sodium chloride  3 mL Intravenous Q12H   Infusions:  . sodium chloride 1,000 mL (07/15/13 0853)  . dextrose 5 % and 0.45 % NaCl with KCl 20 mEq/L 100 mL/hr at 07/15/13 1410  . heparin 900 Units/hr (07/15/13 1200)    Assessment: 73 yo female with afib on heparin and initial heparin level is below goal (HL= 0.14).   Goal of Therapy:  Heparin level 0.3-0.7 units/ml Monitor platelets by anticoagulation protocol: Yes   Plan:   -Heparin bolus 2000 units IV followed by an increase to  1100 units/hr  -Heparin level and CBC daily  65, Pharm D 07/15/2013 6:00 PM

## 2013-07-15 NOTE — ED Notes (Signed)
Pt arrives via EMS from home w onset N/V/D at 2200. Pt states 5x V/D. Last episode at 0300 and pt experienced severe dizziness. Pt has hx afib and checked her pulse and it did not feel like her normal. Upon EMS arrival pt had int dizziness, denies SOB, no present N/V/D. Pt was afib rate 50-160, asymptomatic. BP 116/78. CBG 178. During transport, pt experienced sinus headache above left eye and resolved without intervention. 22 ring finger. 4mg  zofran IV given. 95% 2L [place].

## 2013-07-15 NOTE — Progress Notes (Signed)
ANTICOAGULATION CONSULT NOTE - Initial Consult  Pharmacy Consult for heparin Indication: atrial fibrillation  Allergies  Allergen Reactions  . Condrolite [Glucosamine-Chondroitin-Msm] Rash  . Glucosamine Forte [Nutritional Supplements] Rash    Patient Measurements: Height: 5\' 3"  (160 cm) Weight: 185 lb (83.915 kg) IBW/kg (Calculated) : 52.4 Heparin Dosing Weight: 63 kg  Vital Signs: Temp: 98.5 F (36.9 C) (02/20 0813) Temp src: Oral (02/20 0813) BP: 119/84 mmHg (02/20 0845) Pulse Rate: 156 (02/20 0845)  Labs:  Recent Labs  07/15/13 0546  HGB 16.3*  HCT 48.2*  PLT 199  CREATININE 0.68    Estimated Creatinine Clearance: 64.3 ml/min (by C-G formula based on Cr of 0.68).   Medical History: Past Medical History  Diagnosis Date  . A-fib   . Macular degeneration   . Allergy     Medications:  See med rec  Assessment: 73 yo lady to start heparin for afib.  Her afib was likely triggered by GI illness.  She was not on any anticoagulants PTA. Goal of Therapy:  Heparin level 0.3-0.7 units/ml Monitor platelets by anticoagulation protocol: Yes   Plan:  Heparin bolus 3000 units and drip at 900 units/hr Check heparin level 8 hours after start Daily CBC and HL while on heparin.  Mya Suell Poteet 07/15/2013,9:15 AM

## 2013-07-15 NOTE — ED Notes (Signed)
Called pharmacy, They just sent Heparin, Should be arriving shortly

## 2013-07-15 NOTE — Progress Notes (Signed)
  Echocardiogram 2D Echocardiogram has been performed.  Amy Gothard FRANCES 07/15/2013, 5:07 PM

## 2013-07-15 NOTE — ED Notes (Signed)
Pt. Is aware of needing a urine specimen 

## 2013-07-16 LAB — CBC
HCT: 38 % (ref 36.0–46.0)
HEMOGLOBIN: 12.6 g/dL (ref 12.0–15.0)
MCH: 30.1 pg (ref 26.0–34.0)
MCHC: 33.2 g/dL (ref 30.0–36.0)
MCV: 90.7 fL (ref 78.0–100.0)
PLATELETS: 163 10*3/uL (ref 150–400)
RBC: 4.19 MIL/uL (ref 3.87–5.11)
RDW: 14.4 % (ref 11.5–15.5)
WBC: 4.4 10*3/uL (ref 4.0–10.5)

## 2013-07-16 LAB — BASIC METABOLIC PANEL
BUN: 12 mg/dL (ref 6–23)
CO2: 23 mEq/L (ref 19–32)
CREATININE: 0.76 mg/dL (ref 0.50–1.10)
Calcium: 7.6 mg/dL — ABNORMAL LOW (ref 8.4–10.5)
Chloride: 106 mEq/L (ref 96–112)
GFR calc non Af Amer: 82 mL/min — ABNORMAL LOW (ref >90)
Glucose, Bld: 112 mg/dL — ABNORMAL HIGH (ref 70–99)
POTASSIUM: 3.5 meq/L — AB (ref 3.7–5.3)
Sodium: 138 mEq/L (ref 137–147)

## 2013-07-16 LAB — HEPARIN LEVEL (UNFRACTIONATED): Heparin Unfractionated: 0.37 IU/mL (ref 0.30–0.70)

## 2013-07-16 MED ORDER — APIXABAN 5 MG PO TABS: 5.0000 mg | ORAL_TABLET | Freq: Two times a day (BID) | ORAL | Status: DC

## 2013-07-16 MED ORDER — POTASSIUM CHLORIDE CRYS ER 20 MEQ PO TBCR
40.0000 meq | EXTENDED_RELEASE_TABLET | Freq: Once | ORAL | Status: AC
  Administered 2013-07-16: 40 meq via ORAL
  Filled 2013-07-16: qty 2

## 2013-07-16 MED ORDER — APIXABAN 5 MG PO TABS
5.0000 mg | ORAL_TABLET | Freq: Two times a day (BID) | ORAL | Status: DC
  Administered 2013-07-16: 5 mg via ORAL
  Filled 2013-07-16 (×2): qty 1

## 2013-07-16 MED ORDER — METOPROLOL TARTRATE 25 MG PO TABS: 25.0000 mg | ORAL_TABLET | Freq: Two times a day (BID) | ORAL | Status: DC

## 2013-07-16 NOTE — Progress Notes (Signed)
Pt c/o feeling palpitations . Heart tones slightly distant w/very soft murmur . NSR on monitor w/rare PAC's before/p = 114/51(66). RR = 18 . Spo2 = 95% on RA Skin - warm and dry --color wnl .

## 2013-07-16 NOTE — Discharge Summary (Signed)
Physician Discharge Summary  Rebecca Short OBS:[id number] DOB: [birth year] DOA: 07/15/2013  PCP: Mickie Hillier, MD  Admit date: 07/15/2013 Discharge date: 07/16/2013  Time spent: > 35 minutes  Recommendations for Outpatient Follow-up:  1. Pt will need to have hgb and renal function reassessed in 1 month 2. Aspirin discontinued and patient started on apixaban per recommendations of cardiologist  Discharge Diagnoses:  Principal Problem:   Atrial fibrillation with RVR Active Problems:   Acute gastroenteritis   Discharge Condition: Stable  Diet recommendation: heart healthy  Filed Weights   07/15/13 0450 07/15/13 1230  Weight: 83.915 kg (185 lb) 87.4 kg (192 lb 10.9 oz)    History of present illness:  73 y/o female with history of atrial fibrillation on aspirin at home that presented to the ED complaining of diarrhea, emesis, and found to have Atrial fibrillation with RVR in the ED.  Hospital Course:  Principal Problem:   Atrial fibrillation with RVR - Cardiology on board while patient in house and recommended the following:  paroxysmal atrial fibrillation-the patient has converted to sinus rhythm. Plan to continue metoprolol. She was on Cardizem prior to admission and we will discontinue this. If she has more frequent episodes in the future she would need an antiarrhythmic. Echocardiogram shows normal LV function. She has embolic risk factor of female sex and age greater than 53. By her report she is having episodes of atrial fibrillation at home. I would therefore favor long-term anticoagulation as her CHADSvasc is 2. DC ASA and heparin. Begin apixaban 5 mg po BID. Check hemoglobin and renal function in 4 weeks. Followup Dr. Eldridge Dace 2-4 weeks.  Active Problems:   Acute gastroenteritis - resolving. Patient able to tolerate po intake this am. - Recommended good hand hygiene and good hydration.  Procedures: None  Consultations:  Cardiology: Dr. Jens Som  Discharge  Exam: Filed Vitals:   07/16/13 0800  BP: 108/52  Pulse: 61  Temp: 98.8 F (37.1 C)  Resp: 17    General: Pt in NAD, alert and awake Cardiovascular: RRR, no MRG Respiratory: CTA BL, no wheezes  Discharge Instructions  Discharge Orders   Future Appointments Provider Department Dept Phone   09/14/2013 10:00 AM Everette Rank, MD [facility] [phone]   Future Orders Complete By Expires   Call MD for:  temperature >100.4  As directed    Diet - low sodium heart healthy  As directed    Discharge instructions  As directed    Comments:     Pt will need to follow up with the cardiologist in 2-4 weeks.   Increase activity slowly  As directed        Medication List    STOP taking these medications       aspirin EC 325 MG tablet     diltiazem 180 MG 24 hr capsule  Commonly known as:  DILACOR XR      TAKE these medications       apixaban 5 MG Tabs tablet  Commonly known as:  ELIQUIS  Take 1 tablet (5 mg total) by mouth 2 (two) times daily.     metoprolol tartrate 25 MG tablet  Commonly known as:  LOPRESSOR  Take 1 tablet (25 mg total) by mouth 2 (two) times daily.     multivitamin with minerals Tabs tablet  Take 1 tablet by mouth daily.     ranibizumab 0.5 MG/0.05ML Soln  Commonly known as:  LUCENTIS  0.5 mg by Intravitreal route once. Every 7 weeks  Allergies  Allergen Reactions  . Condrolite [Glucosamine-Chondroitin-Msm] Rash  . Glucosamine Forte [Nutritional Supplements] Rash      The results of significant diagnostics from this hospitalization (including imaging, microbiology, ancillary and laboratory) are listed below for reference.    Significant Diagnostic Studies: No results found.  Microbiology: Recent Results (from the past 240 hour(s))  MRSA PCR SCREENING     Status: None   Collection Time    07/15/13 12:28 PM      Result Value Ref Range Status   MRSA by PCR NEGATIVE  NEGATIVE Final   Comment:            The GeneXpert  MRSA Assay (FDA     approved for NASAL specimens     only), is one component of a     comprehensive MRSA colonization     surveillance program. It is not     intended to diagnose MRSA     infection nor to guide or     monitor treatment for     MRSA infections.     Labs: Basic Metabolic Panel:  Recent Labs Lab 07/15/13 0546 07/16/13 0305  NA 141 138  K 4.2 3.5*  CL 102 106  CO2 20 23  GLUCOSE 113* 112*  BUN 17 12  CREATININE 0.68 0.76  CALCIUM 8.7 7.6*   Liver Function Tests:  Recent Labs Lab 07/15/13 0546  AST 49*  ALT 40*  ALKPHOS 95  BILITOT 0.4  PROT 6.5  ALBUMIN 3.2*   No results found for this basename: LIPASE, AMYLASE,  in the last 168 hours No results found for this basename: AMMONIA,  in the last 168 hours CBC:  Recent Labs Lab 07/15/13 0546 07/16/13 0430  WBC 9.1 4.4  NEUTROABS 8.3*  --   HGB 16.3* 12.6  HCT 48.2* 38.0  MCV 90.4 90.7  PLT 199 163   Cardiac Enzymes: No results found for this basename: CKTOTAL, CKMB, CKMBINDEX, TROPONINI,  in the last 168 hours BNP: BNP (last 3 results) No results found for this basename: PROBNP,  in the last 8760 hours CBG: No results found for this basename: GLUCAP,  in the last 168 hours     Signed:  Penny Pia  [facility] 07/16/2013, 9:52 AM

## 2013-07-16 NOTE — Progress Notes (Signed)
    Subjective:  Denies CP or dyspnea   Objective:  Filed Vitals:   07/15/13 1900 07/15/13 2000 07/16/13 0000 07/16/13 0400  BP: 120/60 104/47 117/49 96/48  Pulse: 69 69  64  Temp:  98.3 F (36.8 C) 97.9 F (36.6 C) 97.9 F (36.6 C)  TempSrc:  Oral Oral Oral  Resp: 18 18 21 18   Height:      Weight:      SpO2: 94% 93% 94% 92%    Intake/Output from previous day:  Intake/Output Summary (Last 24 hours) at 07/16/13 07/18/13 Last data filed at 07/16/13 0600  Gross per 24 hour  Intake 2352.29 ml  Output    600 ml  Net 1752.29 ml    Physical Exam: Physical exam: Well-developed well-nourished in no acute distress.  Skin is warm and dry.  HEENT is normal.  Neck is supple.  Chest is clear to auscultation with normal expansion.  Cardiovascular exam is regular rate and rhythm.  Abdominal exam nontender or distended. No masses palpated. Extremities show no edema. neuro grossly intact    Lab Results: Basic Metabolic Panel:  Recent Labs  07/18/13 0546 07/16/13 0305  NA 141 138  K 4.2 3.5*  CL 102 106  CO2 20 23  GLUCOSE 113* 112*  BUN 17 12  CREATININE 0.68 0.76  CALCIUM 8.7 7.6*   CBC:  Recent Labs  07/15/13 0546 07/16/13 0430  WBC 9.1 4.4  NEUTROABS 8.3*  --   HGB 16.3* 12.6  HCT 48.2* 38.0  MCV 90.4 90.7  PLT 199 163     Assessment/Plan:  1 paroxysmal atrial fibrillation-the patient has converted to sinus rhythm. Plan to continue metoprolol. She was on Cardizem prior to admission and we will discontinue this. If she has more frequent episodes in the future she would need an antiarrhythmic. Echocardiogram shows normal LV function. She has embolic risk factor of female sex and age greater than 51. By her report she is having episodes of atrial fibrillation at home. I would therefore favor long-term anticoagulation as her CHADSvasc is 2. DC ASA and heparin. Begin apixaban 5 mg po BID. Check hemoglobin and renal function in 4 weeks. Followup Dr. 76 2-4  weeks. 2 question right atrial mass on echocardiogram-I think this is unlikely. However would recommend outpatient cardiac MRI to further evaluate. 3 viral gastroenteritis-mild diarrhea this morning. She feels much improved. Greater than 30 minutes PA and physician time D2  Eldridge Dace 07/16/2013, 8:07 AM

## 2013-07-17 ENCOUNTER — Other Ambulatory Visit: Payer: Self-pay | Admitting: Physician Assistant

## 2013-07-17 DIAGNOSIS — I5189 Other ill-defined heart diseases: Secondary | ICD-10-CM

## 2013-07-18 ENCOUNTER — Telehealth: Payer: Self-pay | Admitting: Interventional Cardiology

## 2013-07-18 NOTE — Telephone Encounter (Signed)
New message     Pt is in AFIB.  Dr Sol Passer took her off diltiazem and put her on metoprolol at the hosp.  Pt went to the ER on Friday am and came home sat afternoon.  Could this medication change cause AFIB?  Do you need to see her?

## 2013-07-18 NOTE — Telephone Encounter (Signed)
Pt reports being in Afib for about 4 hours this am, has taken AM meds, BP 124/78, she is asymptomatic and on Eliquis.    Pt has concerns/questions for medication changes while in hospital.    Instructed her that changes made while she was in hospital may not be what she will be maintained on.   She was dehydrated, hypotensive and in RAF and was given IV metoprolol and sent home on metoprolol but is used to taking long acting diltiazem.   Reviewed this information with her, made post-hospital  appointment with Dr. Eldridge Dace for 2/25.

## 2013-07-20 ENCOUNTER — Ambulatory Visit: Payer: Medicare Other | Admitting: Interventional Cardiology

## 2013-07-20 ENCOUNTER — Encounter: Payer: Self-pay | Admitting: Interventional Cardiology

## 2013-07-20 ENCOUNTER — Ambulatory Visit (INDEPENDENT_AMBULATORY_CARE_PROVIDER_SITE_OTHER): Payer: Medicare Other | Admitting: Interventional Cardiology

## 2013-07-20 ENCOUNTER — Ambulatory Visit: Payer: Medicare Other | Admitting: Licensed Clinical Social Worker

## 2013-07-20 VITALS — BP 118/60 | HR 56 | Ht 63.0 in | Wt 193.0 lb

## 2013-07-20 DIAGNOSIS — H3530 Unspecified macular degeneration: Secondary | ICD-10-CM

## 2013-07-20 DIAGNOSIS — I4891 Unspecified atrial fibrillation: Secondary | ICD-10-CM

## 2013-07-20 DIAGNOSIS — R9430 Abnormal result of cardiovascular function study, unspecified: Secondary | ICD-10-CM

## 2013-07-20 NOTE — Patient Instructions (Signed)
Your physician recommends that you return for lab work on 07/25/13 for TSH.  Your physician recommends that you return for lab work on 01/17/14 for bmet and cbc.  Your physician recommends that you schedule a follow-up appointment as scheduled.

## 2013-07-20 NOTE — Progress Notes (Signed)
Patient ID: Rebecca Short, female   DOB: Feb 07, 1941, 73 y.o.   MRN: [id number]    [address] [place], [place]  [address] Phone: [phone] Fax:  [phone]  Date:  07/20/2013   ID:  Quaniyah, Bugh [birth year], MRN [id number]  PCP:  Mickie Hillier, MD      History of Present Illness: Rebecca Short is a 73 y.o. female who has been doing well with PAF. She has had occasional palpitations. Managing hydration and stress have helped minimize palpitations. She exercises 2-3x/week on the treadmill. The bike was bothering her back. No SHOB. SHe does some weights. No bleeding problems.  She had a GI illnes and was dehydrated.  SHe felt AFib and dizziness and went to the ER.  HR was in the 160 range.  SHe was admitted.  She converted after 7 hours of AFib.  She was started on Eliquis.  She was discharged on metoprolol instead of diltiazem.  She had one episode of AFib as well.  It was shortlived.  She feels that she is getting better of late.    Wt Readings from Last 3 Encounters:  07/20/13 193 lb (87.544 kg)  07/15/13 192 lb 10.9 oz (87.4 kg)     Past Medical History  Diagnosis Date  . A-fib   . Macular degeneration   . Allergy     Current Outpatient Prescriptions  Medication Sig Dispense Refill  . apixaban (ELIQUIS) 5 MG TABS tablet Take 1 tablet (5 mg total) by mouth 2 (two) times daily.  60 tablet  0  . CALCIUM-VITAMIN D PO Take by mouth daily.      . metoprolol tartrate (LOPRESSOR) 25 MG tablet Take 1 tablet (25 mg total) by mouth 2 (two) times daily.  60 tablet  0  . Multiple Vitamin (MULTIVITAMIN WITH MINERALS) TABS tablet Take 1 tablet by mouth daily.      . Multiple Vitamins-Minerals (PRESERVISION AREDS PO) Take 2 capsules by mouth daily.      . ranibizumab (LUCENTIS) 0.5 MG/0.05ML SOLN 0.5 mg by Intravitreal route once. Every 7 weeks       No current facility-administered medications for this visit.    Allergies:    Allergies  Allergen Reactions  .  Condrolite [Glucosamine-Chondroitin-Msm] Rash  . Glucosamine Forte [Nutritional Supplements] Rash    Social History:  The patient  reports that she has never smoked. She does not have any smokeless tobacco history on file. She reports that she does not drink alcohol or use illicit drugs.   Family History:  The patient's family history includes Dementia in her paternal grandmother; Heart Problems in her father; Kidney failure in her brother; Osteoporosis in her mother; Stroke in her father; Suicidality in an other family member.   ROS:  Please see the history of present illness.  No nausea, vomiting.  No fevers, chills.  No focal weakness.  No dysuria.    All other systems reviewed and negative.   PHYSICAL EXAM: VS:  BP 118/60  Pulse 56  Ht 5\' 3"  (1.6 m)  Wt 193 lb (87.544 kg)  BMI 34.20 kg/m2 Well nourished, well developed, in no acute distress HEENT: normal Neck: no JVD, no carotid bruits Cardiac:  normal S1, S2; RRR;  Lungs:  clear to auscultation bilaterally, no wheezing, rhonchi or rales Abd: soft, nontender, no hepatomegaly Ext: no edema Skin: warm and dry Neuro:   no focal abnormalities noted  EKG:  AFib with RVR in the  hospital 2/20.     ASSESSMENT AND PLAN:  A-fib  Stopped Aspirin Tablet, 325 MG, 1 tablet, Orally, Once a day Stopped Diltiazem HCl ER Beads Capsule Extended Release 24 Hour, 180 MG, 1 capsule, Orally, Once a day Notes: Minimal palpitations. Maintaining NSR. Continue regular water intake intake. Aortic sclerosis unchanged. OK to take Vit C and Vit E for macular degeneration. Agree with starting Eliquis to reduce stroke risk.  Metoprolol was started.  Dose will be limited by her resting heart rate.  Continue metoprolol 25 mg BID.  If she has more episodes of AFib, can consider going up to 50 mg daily.  Check TSH.  2. Macular degeneration: She needs to eat leafy green vegetables, which would not be permittted on Coumadin, to treat her macular degeneration.  CONtinue Eliquis.  If Xarelto is cheaper, could switch to that.  She will check with the pharmacy.      3. Question of right atrial mass on echo.  MRI was recommended.  She had double knee replacement.  Will see about scheduling this when she has recovered fully from her GI bug.  Notes: Lipids well controlled last year.    Preventive Medicine  Adult topics discussed:  Diet: healthy diet.  Exercise: 5 days a week, at least 30 minutes of aerobic exercise.      Signed, Fredric Mare, MD, [facility] 07/20/2013 2:56 PM

## 2013-07-21 DIAGNOSIS — R9430 Abnormal result of cardiovascular function study, unspecified: Secondary | ICD-10-CM | POA: Insufficient documentation

## 2013-07-21 DIAGNOSIS — H3530 Unspecified macular degeneration: Secondary | ICD-10-CM | POA: Insufficient documentation

## 2013-07-25 ENCOUNTER — Other Ambulatory Visit (INDEPENDENT_AMBULATORY_CARE_PROVIDER_SITE_OTHER): Payer: Medicare Other

## 2013-07-25 DIAGNOSIS — I4891 Unspecified atrial fibrillation: Secondary | ICD-10-CM

## 2013-07-25 LAB — TSH: TSH: 1.22 u[IU]/mL (ref 0.35–5.50)

## 2013-07-27 ENCOUNTER — Ambulatory Visit (INDEPENDENT_AMBULATORY_CARE_PROVIDER_SITE_OTHER): Payer: Medicare Other | Admitting: Licensed Clinical Social Worker

## 2013-07-27 DIAGNOSIS — F411 Generalized anxiety disorder: Secondary | ICD-10-CM

## 2013-07-27 NOTE — Progress Notes (Signed)
Ok for sleep study; observed apnea

## 2013-08-03 ENCOUNTER — Ambulatory Visit (INDEPENDENT_AMBULATORY_CARE_PROVIDER_SITE_OTHER): Payer: Medicare Other | Admitting: Licensed Clinical Social Worker

## 2013-08-03 ENCOUNTER — Ambulatory Visit: Payer: Medicare Other | Admitting: Interventional Cardiology

## 2013-08-03 DIAGNOSIS — F411 Generalized anxiety disorder: Secondary | ICD-10-CM

## 2013-08-08 ENCOUNTER — Encounter: Payer: Self-pay | Admitting: Interventional Cardiology

## 2013-08-09 ENCOUNTER — Telehealth: Payer: Self-pay | Admitting: Cardiology

## 2013-08-09 DIAGNOSIS — I4891 Unspecified atrial fibrillation: Secondary | ICD-10-CM

## 2013-08-09 DIAGNOSIS — R0683 Snoring: Secondary | ICD-10-CM

## 2013-08-09 DIAGNOSIS — G4730 Sleep apnea, unspecified: Secondary | ICD-10-CM

## 2013-08-09 NOTE — Telephone Encounter (Addendum)
Fae Pippin, PA ordered MRI at the hospital. We will go ahead and schedule pt for MRI. Please advise on sleep study.

## 2013-08-09 NOTE — Telephone Encounter (Signed)
To Dr. Eldridge Dace, ok to order? Dx. Code?

## 2013-08-09 NOTE — Telephone Encounter (Signed)
Rebecca Short, I have two things I need help with.   1) When I was in the hospital Feb.20, they did an ultrasound on my heart. There was an area that was not clear and it was recommended I have an MRI. When I saw Dr. Eldridge Dace on Feb 25, I felt I was not up to handling having an MRI. Now I want to be scheduled for an MRI.   2) I feel it would be appropriate for me to be tested for sleep apnea. phone [phone]

## 2013-08-10 ENCOUNTER — Ambulatory Visit (INDEPENDENT_AMBULATORY_CARE_PROVIDER_SITE_OTHER): Payer: Medicare Other | Admitting: Licensed Clinical Social Worker

## 2013-08-10 ENCOUNTER — Other Ambulatory Visit: Payer: Self-pay | Admitting: *Deleted

## 2013-08-10 DIAGNOSIS — F411 Generalized anxiety disorder: Secondary | ICD-10-CM

## 2013-08-10 MED ORDER — METOPROLOL TARTRATE 25 MG PO TABS: 25.0000 mg | ORAL_TABLET | Freq: Two times a day (BID) | ORAL | Status: DC

## 2013-08-10 MED ORDER — APIXABAN 5 MG PO TABS: 5.0000 mg | ORAL_TABLET | Freq: Two times a day (BID) | ORAL | Status: DC

## 2013-08-10 NOTE — Telephone Encounter (Signed)
Ok to order sleep study; snoring, afib, observed apnea

## 2013-08-11 NOTE — Telephone Encounter (Signed)
PSG ordered and packet mailed. [facility] will call pt with appt.

## 2013-08-11 NOTE — Telephone Encounter (Signed)
Pts husband aware of below.

## 2013-08-11 NOTE — Addendum Note (Signed)
Addended byOrlene Plum H on: 08/11/2013 02:21 PM   Modules accepted: Orders

## 2013-08-15 ENCOUNTER — Encounter: Payer: Self-pay | Admitting: Interventional Cardiology

## 2013-08-17 ENCOUNTER — Ambulatory Visit (INDEPENDENT_AMBULATORY_CARE_PROVIDER_SITE_OTHER): Payer: Medicare Other | Admitting: Licensed Clinical Social Worker

## 2013-08-17 DIAGNOSIS — F411 Generalized anxiety disorder: Secondary | ICD-10-CM

## 2013-08-18 ENCOUNTER — Encounter: Payer: Self-pay | Admitting: Interventional Cardiology

## 2013-08-24 ENCOUNTER — Ambulatory Visit (INDEPENDENT_AMBULATORY_CARE_PROVIDER_SITE_OTHER): Payer: Medicare Other | Admitting: Licensed Clinical Social Worker

## 2013-08-24 DIAGNOSIS — F411 Generalized anxiety disorder: Secondary | ICD-10-CM

## 2013-08-25 ENCOUNTER — Encounter: Payer: Self-pay | Admitting: Interventional Cardiology

## 2013-08-25 NOTE — Telephone Encounter (Signed)
Per Dr. Eldridge Dace called in Valium 5 mg disp 1 tab for pt to take prior to Cardiac MRI.

## 2013-09-05 ENCOUNTER — Encounter: Payer: Self-pay | Admitting: Cardiology

## 2013-09-05 ENCOUNTER — Encounter: Payer: Self-pay | Admitting: Interventional Cardiology

## 2013-09-06 ENCOUNTER — Ambulatory Visit (HOSPITAL_COMMUNITY)
Admission: RE | Admit: 2013-09-06 | Discharge: 2013-09-06 | Disposition: A | Discharge status: Home / Self Care | Payer: Medicare Other | Source: Ambulatory Visit | Attending: Physician Assistant | Admitting: Physician Assistant

## 2013-09-06 DIAGNOSIS — I319 Disease of pericardium, unspecified: Secondary | ICD-10-CM | POA: Insufficient documentation

## 2013-09-06 DIAGNOSIS — I5189 Other ill-defined heart diseases: Secondary | ICD-10-CM

## 2013-09-06 DIAGNOSIS — R222 Localized swelling, mass and lump, trunk: Secondary | ICD-10-CM | POA: Insufficient documentation

## 2013-09-06 LAB — CREATININE, SERUM
Creatinine, Ser: 0.89 mg/dL (ref 0.50–1.10)
GFR calc Af Amer: 73 mL/min — ABNORMAL LOW (ref >90)
GFR calc non Af Amer: 63 mL/min — ABNORMAL LOW (ref >90)

## 2013-09-06 MED ORDER — GADOBENATE DIMEGLUMINE 529 MG/ML IV SOLN
30.0000 mL | Freq: Once | INTRAVENOUS | Status: AC
  Administered 2013-09-06: 28 mL via INTRAVENOUS

## 2013-09-07 ENCOUNTER — Ambulatory Visit: Payer: Medicare Other | Admitting: Licensed Clinical Social Worker

## 2013-09-14 ENCOUNTER — Ambulatory Visit (INDEPENDENT_AMBULATORY_CARE_PROVIDER_SITE_OTHER): Payer: Medicare Other | Admitting: Licensed Clinical Social Worker

## 2013-09-14 ENCOUNTER — Ambulatory Visit: Payer: Medicare Other | Admitting: Interventional Cardiology

## 2013-09-14 DIAGNOSIS — F411 Generalized anxiety disorder: Secondary | ICD-10-CM

## 2013-09-16 ENCOUNTER — Encounter: Payer: Self-pay | Admitting: Cardiology

## 2013-09-21 ENCOUNTER — Ambulatory Visit (INDEPENDENT_AMBULATORY_CARE_PROVIDER_SITE_OTHER): Payer: Medicare Other | Admitting: Licensed Clinical Social Worker

## 2013-09-21 DIAGNOSIS — F411 Generalized anxiety disorder: Secondary | ICD-10-CM

## 2013-09-22 ENCOUNTER — Encounter: Payer: Self-pay | Admitting: Interventional Cardiology

## 2013-09-22 ENCOUNTER — Ambulatory Visit (INDEPENDENT_AMBULATORY_CARE_PROVIDER_SITE_OTHER): Payer: Medicare Other | Admitting: Interventional Cardiology

## 2013-09-22 ENCOUNTER — Other Ambulatory Visit: Payer: Self-pay | Admitting: Cardiology

## 2013-09-22 ENCOUNTER — Telehealth: Payer: Self-pay | Admitting: Cardiology

## 2013-09-22 VITALS — BP 128/80 | HR 60 | Ht 63.5 in | Wt 189.0 lb

## 2013-09-22 DIAGNOSIS — I4891 Unspecified atrial fibrillation: Secondary | ICD-10-CM

## 2013-09-22 MED ORDER — APIXABAN 5 MG PO TABS: 5.0000 mg | ORAL_TABLET | Freq: Two times a day (BID) | ORAL | Status: DC

## 2013-09-22 MED ORDER — METOPROLOL TARTRATE 25 MG PO TABS
25.0000 mg | ORAL_TABLET | Freq: Two times a day (BID) | ORAL | Status: DC
Start: 2013-09-22 — End: 2014-09-24

## 2013-09-22 NOTE — Patient Instructions (Signed)
Your physician recommends that you continue on your current medications as directed. Please refer to the Current Medication list given to you today.  Your physician wants you to follow-up in: 6 months with Dr. Varanasi.  You will receive a reminder letter in the mail two months in advance. If you don't receive a letter, please call our office to schedule the follow-up appointment.  

## 2013-09-22 NOTE — Telephone Encounter (Signed)
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  Please set up with [facility] sleep center

## 2013-09-22 NOTE — Progress Notes (Signed)
Patient ID: Rebecca Short, female   DOB: 1940/09/12, 73 y.o.   MRN: [id number] Patient ID: Rebecca Short, female   DOB: 1940/12/18, 73 y.o.   MRN: [id number]    [address] [place], [place]  [address] Phone: [phone] Fax:  [phone]  Date:  09/22/2013   ID:  Rebecca, Short 15-Jul-1940, MRN [id number]  PCP:  Mickie Hillier, MD      History of Present Illness: Rebecca Short is a 73 y.o. female who has been doing well with PAF. She has had occasional palpitations. Managing hydration and stress have helped minimize palpitations. She exercises 2-3x/week on the treadmill. The bike was bothering her back. No SHOB. SHe does some weights. No bleeding problems.  She had a GI illnes and was dehydrated.  She felt AFib and dizziness and went to the ER.  HR was in the 160 range.  SHe was admitted.  She converted after 7 hours of AFib.  She was started on Eliquis.  She was discharged on metoprolol instead of diltiazem.  She had one episode of AFib as well.  It was shortlived.  She postponed MRI because of this.    MRI done recently showed no cardiac mass.  Over the past few weeeks.  SH ehas felt well.  No irregular pulse.  Some anxiety but never any associated irregular heartbeat.    Wt Readings from Last 3 Encounters:  09/22/13 189 lb (85.73 kg)  07/20/13 193 lb (87.544 kg)  07/15/13 192 lb 10.9 oz (87.4 kg)     Past Medical History  Diagnosis Date  . A-fib   . Macular degeneration   . Allergy     Current Outpatient Prescriptions  Medication Sig Dispense Refill  . apixaban (ELIQUIS) 5 MG TABS tablet Take 1 tablet (5 mg total) by mouth 2 (two) times daily.  60 tablet  1  . CALCIUM-VITAMIN D PO Take by mouth daily.      . metoprolol tartrate (LOPRESSOR) 25 MG tablet Take 1 tablet (25 mg total) by mouth 2 (two) times daily.  60 tablet  1  . Multiple Vitamin (MULTIVITAMIN WITH MINERALS) TABS tablet Take 1 tablet by mouth daily.      . Multiple Vitamins-Minerals  (PRESERVISION AREDS PO) Take 2 capsules by mouth daily.      . ranibizumab (LUCENTIS) 0.5 MG/0.05ML SOLN 0.5 mg by Intravitreal route once. Every 7 weeks       No current facility-administered medications for this visit.    Allergies:    Allergies  Allergen Reactions  . Condrolite [Glucosamine-Chondroitin-Msm] Rash  . Glucosamine Forte [Nutritional Supplements] Rash    Social History:  The patient  reports that she has never smoked. She does not have any smokeless tobacco history on file. She reports that she does not drink alcohol or use illicit drugs.   Family History:  The patient's family history includes Dementia in her paternal grandmother; Heart Problems in her father; Kidney failure in her brother; Osteoporosis in her mother; Stroke in her father; Suicidality in an other family member.   ROS:  Please see the history of present illness.  No nausea, vomiting.  No fevers, chills.  No focal weakness.  No dysuria.    All other systems reviewed and negative.   PHYSICAL EXAM: VS:  BP 128/80  Pulse 60  Ht 5' 3.5" (1.613 m)  Wt 189 lb (85.73 kg)  BMI 32.95 kg/m2 Well nourished, well developed, in no acute distress  HEENT: normal Neck: no JVD, no carotid bruits Cardiac:  normal S1, S2; RRR;  Lungs:  clear to auscultation bilaterally, no wheezing, rhonchi or rales Abd: soft, nontender, no hepatomegaly Ext: no edema Skin: warm and dry Neuro:   no focal abnormalities noted  EKG:  AFib with RVR in the hospital 2/20.     ASSESSMENT AND PLAN:  A-fib  Stopped Aspirin Tablet, 325 MG, 1 tablet, Orally, Once a day Stopped Diltiazem HCl ER Beads Capsule Extended Release 24 Hour, 180 MG, 1 capsule, Orally, Once a day Notes: Minimal palpitations. Maintaining NSR. Continue regular water intake intake. Aortic sclerosis unchanged. OK to take Vit C and Vit E for macular degeneration. Agree with starting Eliquis to reduce stroke risk.  Metoprolol was started.  Dose will be limited by her  resting heart rate.  Continue metoprolol 25 mg BID.  If she has more episodes of AFib, can consider going up to 50 mg daily. TSH normal.  Will give her the option of taking an extra metoprolol 25 mg with an irregular pulse.  It is ok to exercise.                                                                                                                                                                                                                                                                                                                                                       2. Macular degeneration: She needs to eat leafy green vegetables, which would not be permittted on Coumadin, to treat her macular degeneration.  CONtinue Eliquis.  If Xarelto is cheaper, could switch to that.  She will check with the pharmacy.      3. Question of right atrial mass on echo.  MRI was negative.  No further w/u needed.    Notes: Lipids well controlled  last year.    Preventive Medicine  Adult topics discussed:  Diet: healthy diet.  Exercise: 5 days a week, at least 30 minutes of aerobic exercise.      Signed, Fredric Mare, MD, [facility] 09/22/2013 11:10 AM

## 2013-09-23 NOTE — Telephone Encounter (Signed)
Pt is aware. Filled out Paperwork for pt to have titration and put up front to be faxed to [facility].

## 2013-09-28 ENCOUNTER — Ambulatory Visit: Payer: Medicare Other | Admitting: Licensed Clinical Social Worker

## 2013-09-28 ENCOUNTER — Ambulatory Visit (INDEPENDENT_AMBULATORY_CARE_PROVIDER_SITE_OTHER): Payer: Medicare Other | Admitting: Licensed Clinical Social Worker

## 2013-09-28 DIAGNOSIS — F411 Generalized anxiety disorder: Secondary | ICD-10-CM

## 2013-10-04 ENCOUNTER — Emergency Department (HOSPITAL_COMMUNITY): Payer: Medicare Other

## 2013-10-04 ENCOUNTER — Emergency Department (HOSPITAL_COMMUNITY)
Admission: EM | Admit: 2013-10-04 | Discharge: 2013-10-04 | Disposition: A | Discharge status: Home / Self Care | Payer: Medicare Other | Attending: Emergency Medicine | Admitting: Emergency Medicine

## 2013-10-04 ENCOUNTER — Encounter (HOSPITAL_COMMUNITY): Payer: Self-pay | Admitting: Emergency Medicine

## 2013-10-04 DIAGNOSIS — R110 Nausea: Secondary | ICD-10-CM | POA: Insufficient documentation

## 2013-10-04 DIAGNOSIS — I4891 Unspecified atrial fibrillation: Secondary | ICD-10-CM | POA: Insufficient documentation

## 2013-10-04 DIAGNOSIS — I480 Paroxysmal atrial fibrillation: Secondary | ICD-10-CM | POA: Diagnosis present

## 2013-10-04 DIAGNOSIS — Z8669 Personal history of other diseases of the nervous system and sense organs: Secondary | ICD-10-CM | POA: Insufficient documentation

## 2013-10-04 DIAGNOSIS — R61 Generalized hyperhidrosis: Secondary | ICD-10-CM | POA: Insufficient documentation

## 2013-10-04 DIAGNOSIS — Z7902 Long term (current) use of antithrombotics/antiplatelets: Secondary | ICD-10-CM | POA: Insufficient documentation

## 2013-10-04 DIAGNOSIS — Z79899 Other long term (current) drug therapy: Secondary | ICD-10-CM | POA: Insufficient documentation

## 2013-10-04 LAB — PROTIME-INR
INR: 1.19 (ref 0.00–1.49)
Prothrombin Time: 14.8 seconds (ref 11.6–15.2)

## 2013-10-04 LAB — COMPREHENSIVE METABOLIC PANEL
ALK PHOS: 90 U/L (ref 39–117)
ALT: 19 U/L (ref 0–35)
AST: 19 U/L (ref 0–37)
Albumin: 3.5 g/dL (ref 3.5–5.2)
BUN: 13 mg/dL (ref 6–23)
CALCIUM: 9 mg/dL (ref 8.4–10.5)
CO2: 26 mEq/L (ref 19–32)
Chloride: 102 mEq/L (ref 96–112)
Creatinine, Ser: 0.71 mg/dL (ref 0.50–1.10)
GFR calc Af Amer: >90 mL/min (ref >90)
GFR calc non Af Amer: 84 mL/min — ABNORMAL LOW (ref >90)
Glucose, Bld: 102 mg/dL — ABNORMAL HIGH (ref 70–99)
POTASSIUM: 4.4 meq/L (ref 3.7–5.3)
SODIUM: 139 meq/L (ref 137–147)
TOTAL PROTEIN: 6.4 g/dL (ref 6.0–8.3)
Total Bilirubin: 0.3 mg/dL (ref 0.3–1.2)

## 2013-10-04 LAB — TROPONIN I: Troponin I: <0.3 ng/mL (ref <0.30)

## 2013-10-04 LAB — CBC WITH DIFFERENTIAL/PLATELET
BASOS ABS: 0 10*3/uL (ref 0.0–0.1)
Basophils Relative: 0 % (ref 0–1)
EOS ABS: 0.1 10*3/uL (ref 0.0–0.7)
EOS PCT: 1 % (ref 0–5)
HCT: 44.9 % (ref 36.0–46.0)
Hemoglobin: 14.7 g/dL (ref 12.0–15.0)
Lymphocytes Relative: 13 % (ref 12–46)
Lymphs Abs: 0.8 10*3/uL (ref 0.7–4.0)
MCH: 29.9 pg (ref 26.0–34.0)
MCHC: 32.7 g/dL (ref 30.0–36.0)
MCV: 91.3 fL (ref 78.0–100.0)
Monocytes Absolute: 0.4 10*3/uL (ref 0.1–1.0)
Monocytes Relative: 6 % (ref 3–12)
NEUTROS PCT: 80 % — AB (ref 43–77)
Neutro Abs: 5 10*3/uL (ref 1.7–7.7)
Platelets: 211 10*3/uL (ref 150–400)
RBC: 4.92 MIL/uL (ref 3.87–5.11)
RDW: 13.7 % (ref 11.5–15.5)
WBC: 6.3 10*3/uL (ref 4.0–10.5)

## 2013-10-04 MED ORDER — SODIUM CHLORIDE 0.9 % IV BOLUS (SEPSIS)
500.0000 mL | Freq: Once | INTRAVENOUS | Status: AC
  Administered 2013-10-04: 500 mL via INTRAVENOUS

## 2013-10-04 NOTE — ED Provider Notes (Signed)
CSN: [id number]     Arrival date & time 10/04/13  0920 History   First MD Initiated Contact with Patient 10/04/13 [phone]     Chief Complaint  Patient presents with  . Dizziness  . Atrial Fibrillation     (Consider location/radiation/quality/duration/timing/severity/associated sxs/prior Treatment) Patient is a 73 y.o. female presenting with dizziness and atrial fibrillation. The history is provided by the patient.  Dizziness Quality:  Unable to specify Severity:  Mild Onset quality:  Sudden Duration: minutes to 1 hr. Timing:  Intermittent Progression:  Resolved Chronicity:  Recurrent Context comment:  At rest Relieved by:  Being still Worsened by:  Nothing tried Ineffective treatments:  None tried Associated symptoms: nausea   Associated symptoms: no chest pain, no diarrhea, no headaches, no shortness of breath and no vomiting   Atrial Fibrillation Pertinent negatives include no chest pain, no abdominal pain, no headaches and no shortness of breath.    Past Medical History  Diagnosis Date  . A-fib   . Macular degeneration   . Allergy    Past Surgical History  Procedure Laterality Date  . Replacement total knee bilateral     Family History  Problem Relation Age of Onset  . Osteoporosis Mother   . Heart Problems Father   . Stroke Father   . Kidney failure Brother   . Suicidality    . Dementia Paternal Grandmother    History  Substance Use Topics  . Smoking status: Never Smoker   . Smokeless tobacco: Not on file  . Alcohol Use: No   OB History   Grav Para Term Preterm Abortions TAB SAB Ect Mult Living                 Review of Systems  Constitutional: Negative for fever and fatigue.       Diaphoresis  HENT: Negative for congestion and drooling.   Eyes: Negative for pain.  Respiratory: Negative for cough and shortness of breath.   Cardiovascular: Negative for chest pain.  Gastrointestinal: Positive for nausea. Negative for vomiting, abdominal pain and  diarrhea.  Genitourinary: Negative for dysuria and hematuria.  Musculoskeletal: Negative for back pain, gait problem and neck pain.  Skin: Negative for color change.  Neurological: Positive for dizziness. Negative for headaches.  Hematological: Negative for adenopathy.  Psychiatric/Behavioral: Negative for behavioral problems.  All other systems reviewed and are negative.     Allergies  Condrolite and Glucosamine forte  Home Medications   Prior to Admission medications   Medication Sig Start Date End Date Taking? Authorizing Provider  apixaban (ELIQUIS) 5 MG TABS tablet Take 1 tablet (5 mg total) by mouth 2 (two) times daily. 09/22/13  Yes Corky Crafts, MD  Calcium Citrate (CITRACAL PO) Take 2 tablets by mouth daily.   Yes Historical Provider, MD  metoprolol tartrate (LOPRESSOR) 25 MG tablet Take 1 tablet (25 mg total) by mouth 2 (two) times daily. 09/22/13  Yes Corky Crafts, MD  Multiple Vitamin (MULTIVITAMIN WITH MINERALS) TABS tablet Take 1 tablet by mouth daily.   Yes Historical Provider, MD  Multiple Vitamins-Minerals (PRESERVISION AREDS PO) Take 1 capsule by mouth 2 (two) times daily.    Yes Historical Provider, MD  ranibizumab (LUCENTIS) 0.5 MG/0.05ML SOLN 0.5 mg by Intravitreal route once. Every 8 weeks   Yes Historical Provider, MD   BP 109/53  Pulse 50  Temp(Src) 97.7 F (36.5 C) (Oral)  Resp 13  Ht 5' 3.5" (1.613 m)  Wt 185 lb (83.915 kg)  BMI  32.25 kg/m2  SpO2 98% Physical Exam  Nursing note and vitals reviewed. Constitutional: She is oriented to person, place, and time. She appears well-developed and well-nourished.  HENT:  Head: Normocephalic and atraumatic.  Mouth/Throat: Oropharynx is clear and moist. No oropharyngeal exudate.  Eyes: Conjunctivae and EOM are normal. Pupils are equal, round, and reactive to light.  Neck: Normal range of motion. Neck supple.  Cardiovascular: Normal rate, regular rhythm, normal heart sounds and intact distal pulses.   Exam reveals no gallop and no friction rub.   No murmur heard. Pulmonary/Chest: Effort normal and breath sounds normal. No respiratory distress. She has no wheezes.  Abdominal: Soft. Bowel sounds are normal. There is no tenderness. There is no rebound and no guarding.  Musculoskeletal: Normal range of motion. She exhibits no edema and no tenderness.  Neurological: She is alert and oriented to person, place, and time.  alert, oriented x3 speech: normal in context and clarity memory: intact grossly cranial nerves II-XII: intact motor strength: full proximally and distally no involuntary movements or tremors sensation: intact to light touch diffusely  cerebellar: finger-to-nose and heel-to-shin intact gait: normal forwards and backwards   Skin: Skin is warm and dry.  Psychiatric: She has a normal mood and affect. Her behavior is normal.    ED Course  Procedures (including critical care time) Labs Review Labs Reviewed  CBC WITH DIFFERENTIAL - Abnormal; Notable for the following:    Neutrophils Relative % 80 (*)    All other components within normal limits  COMPREHENSIVE METABOLIC PANEL - Abnormal; Notable for the following:    Glucose, Bld 102 (*)    GFR calc non Af Amer 84 (*)    All other components within normal limits  TROPONIN I  PROTIME-INR    Imaging Review Dg Chest 2 View  10/04/2013   CLINICAL DATA:  History of atrial fibrillation with palpitations and dizziness  EXAM: CHEST  2 VIEW  COMPARISON:  Prior chest x-ray 01/10/2010  FINDINGS: Stable cardiac and mediastinal contours. Linear atelectasis versus scarring in the lingula is stable dating back to 2011 and therefore likely scarring. Trace atherosclerotic calcifications present in the transverse aorta.  There is a 1.6 cm nodular opacity in the right lung apex partially overlying the right clavicle. However, there are linear shadows extending from the region of this nodular opacity up over the soft tissues of the right neck.  This may be artifactual and related to the patient's hair or another object external to the patient. A repeat frontal view was obtained. The nodular opacity is no longer identified in was therefore artifactual.  No focal airspace consolidation, pulmonary edema, pleural effusion or pneumothorax. No acute osseous abnormality. Surgical clips in the right upper quadrant suggest prior cholecystectomy.  IMPRESSION: 1. Stable chest x-ray without acute cardiopulmonary process.   Electronically Signed   By: Malachy Moan M.D.   On: 10/04/2013 12:40     EKG Interpretation   Date/Time:  Tuesday Oct 04 2013 09:24:20 EDT Ventricular Rate:  48 PR Interval:  172 QRS Duration: 84 QT Interval:  462 QTC Calculation: 412 R Axis:   -31 Text Interpretation:  Sinus bradycardia Left axis deviation Low voltage  QRS Cannot rule out Anterior infarct , age undetermined No significant  change since last tracing Confirmed by Briani Maul  MD, Ladonya Jerkins ([id number]) on  10/04/2013 10:31:19 AM      MDM   Final diagnoses:  Paroxysmal a-fib    11:00 AM 73 y.o. female with a history of paroxysmal  A. fib on metoprolol and eliquis who presents with dizziness and palpitations. She had an episode of dizziness and palpitations this morning when she woke up around 5 AM which was intermittent for approximately one hour. She had another episode at her eye doctor's office approximately 2 hours ago. The second episode lasted about 10-15 minutes. She notes that she has similar symptoms about every 6 weeks which is thought to be affiliated with her paroxysmal A. fib. She notes that her second episode today was associated with some nausea and some sweating which was different from her previous episodes. She denies any pain or shortness of breath. She is currently asymptomatic. Will get screening labwork and 500 cc bolus IV fluid.   2:40 PM: Pt remains asx. I interpreted/reviewed the labs and/or imaging which were non-contributory.  Discussed  case briefly w/ on call cardiology. Her sx cw previous episodes of PAF.  I have discussed the diagnosis/risks/treatment options with the patient and family and believe the pt to be eligible for discharge home to follow-up with Dr. Eldridge Dace in 1-2 days. We also discussed returning to the ED immediately if new or worsening sx occur. We discussed the sx which are most concerning (e.g., further episodes, return of dizziness, cp, sob) that necessitate immediate return. Medications administered to the patient during their visit and any new prescriptions provided to the patient are listed below.  Medications given during this visit Medications  sodium chloride 0.9 % bolus 500 mL (0 mLs Intravenous Stopped 10/04/13 1203)    New Prescriptions   No medications on file      Junius Argyle, MD 10/04/13 1609

## 2013-10-04 NOTE — Discharge Instructions (Signed)
Atrial Fibrillation °Atrial fibrillation is a condition that causes your heart to beat irregularly. It may also cause your heart to beat faster than normal. Atrial fibrillation can prevent your heart from pumping blood normally. It increases your risk of stroke and heart problems. °HOME CARE °· Take medications as told by your doctor. °· Only take medications that your doctor says are safe. Some medications can make the condition worse or happen again. °· If blood thinners were prescribed by your doctor, take them exactly as told. Too much can cause bleeding. Too little and you will not have the needed protection against stroke and other problems. °· Perform blood tests at home if told by your doctor. °· Perform blood tests exactly as told by your doctor. °· Do not drink alcohol. °· Do not drink beverages with caffeine such as coffee, soda, and some teas. °· Maintain a healthy weight. °· Do not use diet pills unless your doctor says they are safe. They may make heart problems worse. °· Follow diet instructions as told by your doctor. °· Exercise regularly as told by your doctor. °· Keep all follow-up appointments. °GET HELP RIGHT AWAY IF:  °· You have chest or belly (abdominal) pain. °· You feel sick to your stomach (nauseous) °· You suddenly have swollen feet and ankles. °· You feel dizzy. °· You face, arms, or legs feel numb or weak. °· There is a change in your vision or speech. °· You notice a change in the speed, rhythm, or strength of your heartbeat. °· You suddenly begin peeing (urinating) more often. °· You get tired more easily when moving or exercising. °MAKE SURE YOU:  °· Understand these instructions. °· Will watch your condition. °· Will get help right away if you are not doing well or get worse. °Document Released: 02/19/2008 Document Revised: 09/06/2012 Document Reviewed: 06/22/2012 °[employer]® Patient Information ©2014 [employer]. ° °

## 2013-10-04 NOTE — ED Notes (Signed)
Pt reports waking up this am with dizziness. Having fatigue and felt like HR is irregular, history of atrial fib. HR 48 at triage.

## 2013-10-04 NOTE — ED Notes (Signed)
Pt denies Chest pain

## 2013-10-04 NOTE — ED Notes (Signed)
Verbal order given by rn-hope for a repeat ekg for this patient.

## 2013-10-05 ENCOUNTER — Ambulatory Visit (INDEPENDENT_AMBULATORY_CARE_PROVIDER_SITE_OTHER): Payer: Medicare Other | Admitting: Licensed Clinical Social Worker

## 2013-10-05 DIAGNOSIS — F411 Generalized anxiety disorder: Secondary | ICD-10-CM

## 2013-10-12 ENCOUNTER — Ambulatory Visit: Payer: Medicare Other | Admitting: Licensed Clinical Social Worker

## 2013-10-19 ENCOUNTER — Ambulatory Visit: Payer: Medicare Other | Admitting: Licensed Clinical Social Worker

## 2013-10-26 ENCOUNTER — Ambulatory Visit (INDEPENDENT_AMBULATORY_CARE_PROVIDER_SITE_OTHER): Payer: Medicare Other | Admitting: Licensed Clinical Social Worker

## 2013-10-26 DIAGNOSIS — F411 Generalized anxiety disorder: Secondary | ICD-10-CM

## 2013-10-28 ENCOUNTER — Encounter: Payer: Self-pay | Admitting: Cardiology

## 2013-11-01 ENCOUNTER — Telehealth: Payer: Self-pay | Admitting: Cardiology

## 2013-11-01 DIAGNOSIS — G4733 Obstructive sleep apnea (adult) (pediatric): Secondary | ICD-10-CM

## 2013-11-01 NOTE — Telephone Encounter (Signed)
Order sent.

## 2013-11-01 NOTE — Addendum Note (Signed)
Addended byOrlene Plum H on: 11/01/2013 02:04 PM   Modules accepted: Orders

## 2013-11-01 NOTE — Addendum Note (Signed)
Addended byOrlene Plum H on: 11/01/2013 02:07 PM   Modules accepted: Orders

## 2013-11-01 NOTE — Telephone Encounter (Signed)
Please let patient know that she had a successful CPAP titration.  Please set up Resmed CPAP unit at 6cm H2O, C-flex 3, heated humidifier with medium Resmed Airfit P10 mask for her with chin strap.  Followup with me in 10 weeks

## 2013-11-01 NOTE — Telephone Encounter (Signed)
Pt notified. 10 Week sleep f/u scheduled.

## 2013-11-02 ENCOUNTER — Ambulatory Visit (INDEPENDENT_AMBULATORY_CARE_PROVIDER_SITE_OTHER): Payer: Medicare Other | Admitting: Licensed Clinical Social Worker

## 2013-11-02 DIAGNOSIS — F411 Generalized anxiety disorder: Secondary | ICD-10-CM

## 2013-11-03 NOTE — Progress Notes (Signed)
Patient ID: Rebecca Short, female   DOB: December 13, 1940, 73 y.o.   MRN: [id number]  Sleep apnea:  She has had AFib.  Possible sleep apnea was mentioned to her while she was in the hospital.  [facility] is too fatigued to consider sleep study at this time but will let us know when her strength is better, and we can schedule sleep study at that time.   Sarahmarie Leavey S.

## 2013-11-08 NOTE — Progress Notes (Signed)
Patient ID: Rebecca Short, female   DOB: 04-06-1941, 73 y.o.   MRN: [id number] Will schedule sleep study when her strength is better due to Afib and fatigue; as discussed at the 07/20/13 office visit.

## 2013-11-09 ENCOUNTER — Ambulatory Visit (INDEPENDENT_AMBULATORY_CARE_PROVIDER_SITE_OTHER): Payer: Medicare Other | Admitting: Licensed Clinical Social Worker

## 2013-11-09 DIAGNOSIS — F411 Generalized anxiety disorder: Secondary | ICD-10-CM

## 2013-11-16 ENCOUNTER — Ambulatory Visit (INDEPENDENT_AMBULATORY_CARE_PROVIDER_SITE_OTHER): Payer: Medicare Other | Admitting: Licensed Clinical Social Worker

## 2013-11-16 DIAGNOSIS — F411 Generalized anxiety disorder: Secondary | ICD-10-CM

## 2013-11-23 ENCOUNTER — Ambulatory Visit (INDEPENDENT_AMBULATORY_CARE_PROVIDER_SITE_OTHER): Payer: Medicare Other | Admitting: Licensed Clinical Social Worker

## 2013-11-23 DIAGNOSIS — F411 Generalized anxiety disorder: Secondary | ICD-10-CM

## 2013-11-30 ENCOUNTER — Ambulatory Visit (INDEPENDENT_AMBULATORY_CARE_PROVIDER_SITE_OTHER): Payer: Medicare Other | Admitting: Licensed Clinical Social Worker

## 2013-11-30 DIAGNOSIS — F411 Generalized anxiety disorder: Secondary | ICD-10-CM

## 2013-12-07 ENCOUNTER — Ambulatory Visit (INDEPENDENT_AMBULATORY_CARE_PROVIDER_SITE_OTHER): Payer: Medicare Other | Admitting: Licensed Clinical Social Worker

## 2013-12-07 DIAGNOSIS — F411 Generalized anxiety disorder: Secondary | ICD-10-CM

## 2013-12-11 ENCOUNTER — Encounter: Payer: Self-pay | Admitting: Cardiology

## 2013-12-14 ENCOUNTER — Ambulatory Visit (INDEPENDENT_AMBULATORY_CARE_PROVIDER_SITE_OTHER): Payer: Medicare Other | Admitting: Licensed Clinical Social Worker

## 2013-12-14 DIAGNOSIS — F411 Generalized anxiety disorder: Secondary | ICD-10-CM

## 2013-12-21 ENCOUNTER — Ambulatory Visit (INDEPENDENT_AMBULATORY_CARE_PROVIDER_SITE_OTHER): Payer: Medicare Other | Admitting: Licensed Clinical Social Worker

## 2013-12-21 DIAGNOSIS — F411 Generalized anxiety disorder: Secondary | ICD-10-CM

## 2013-12-28 ENCOUNTER — Ambulatory Visit: Payer: Medicare Other | Admitting: Licensed Clinical Social Worker

## 2014-01-04 ENCOUNTER — Ambulatory Visit (INDEPENDENT_AMBULATORY_CARE_PROVIDER_SITE_OTHER): Payer: Medicare Other | Admitting: Licensed Clinical Social Worker

## 2014-01-04 DIAGNOSIS — F411 Generalized anxiety disorder: Secondary | ICD-10-CM

## 2014-01-11 ENCOUNTER — Ambulatory Visit (INDEPENDENT_AMBULATORY_CARE_PROVIDER_SITE_OTHER): Payer: Medicare Other | Admitting: Licensed Clinical Social Worker

## 2014-01-11 DIAGNOSIS — F411 Generalized anxiety disorder: Secondary | ICD-10-CM

## 2014-01-17 ENCOUNTER — Other Ambulatory Visit: Payer: Medicare Other

## 2014-01-18 ENCOUNTER — Encounter: Payer: Self-pay | Admitting: Cardiology

## 2014-01-18 ENCOUNTER — Ambulatory Visit (INDEPENDENT_AMBULATORY_CARE_PROVIDER_SITE_OTHER): Payer: Medicare Other | Admitting: Licensed Clinical Social Worker

## 2014-01-18 DIAGNOSIS — F411 Generalized anxiety disorder: Secondary | ICD-10-CM

## 2014-01-19 ENCOUNTER — Encounter: Payer: Self-pay | Admitting: Cardiology

## 2014-01-19 ENCOUNTER — Other Ambulatory Visit (INDEPENDENT_AMBULATORY_CARE_PROVIDER_SITE_OTHER): Payer: Medicare Other

## 2014-01-19 ENCOUNTER — Ambulatory Visit (INDEPENDENT_AMBULATORY_CARE_PROVIDER_SITE_OTHER): Payer: Medicare Other | Admitting: Cardiology

## 2014-01-19 VITALS — BP 122/80 | HR 54 | Ht 63.5 in | Wt 190.0 lb

## 2014-01-19 DIAGNOSIS — I482 Chronic atrial fibrillation, unspecified: Secondary | ICD-10-CM

## 2014-01-19 DIAGNOSIS — G4733 Obstructive sleep apnea (adult) (pediatric): Secondary | ICD-10-CM | POA: Insufficient documentation

## 2014-01-19 DIAGNOSIS — I4891 Unspecified atrial fibrillation: Secondary | ICD-10-CM

## 2014-01-19 DIAGNOSIS — E669 Obesity, unspecified: Secondary | ICD-10-CM

## 2014-01-19 LAB — CBC
HEMATOCRIT: 43.2 % (ref 36.0–46.0)
Hemoglobin: 14.3 g/dL (ref 12.0–15.0)
MCHC: 33.1 g/dL (ref 30.0–36.0)
MCV: 89.2 fl (ref 78.0–100.0)
PLATELETS: 230 10*3/uL (ref 150.0–400.0)
RBC: 4.84 Mil/uL (ref 3.87–5.11)
RDW: 15.3 % (ref 11.5–15.5)
WBC: 6.5 10*3/uL (ref 4.0–10.5)

## 2014-01-19 LAB — BASIC METABOLIC PANEL
BUN: 14 mg/dL (ref 6–23)
CHLORIDE: 102 meq/L (ref 96–112)
CO2: 29 meq/L (ref 19–32)
Calcium: 9.3 mg/dL (ref 8.4–10.5)
Creatinine, Ser: 0.7 mg/dL (ref 0.4–1.2)
GFR: 82.85 mL/min (ref >60.00)
Glucose, Bld: 76 mg/dL (ref 70–99)
Potassium: 4.1 mEq/L (ref 3.5–5.1)
SODIUM: 137 meq/L (ref 135–145)

## 2014-01-19 NOTE — Patient Instructions (Signed)
Your physician recommends that you continue on your current medications as directed. Please refer to the Current Medication list given to you today.  Your physician wants you to follow-up in: 6 months with Dr Turner You will receive a reminder letter in the mail two months in advance. If you don't receive a letter, please call our office to schedule the follow-up appointment.  

## 2014-01-19 NOTE — Progress Notes (Signed)
[address] [place], [place]  [address] Phone: [phone] Fax:  [phone]  Date:  01/19/2014   ID:  Rebecca Short, Rebecca Short 06-May-1941, MRN [id number]  PCP:  Mickie Hillier, MD  Cardiologist:  Armanda Magic, MD     History of Present Illness: Rebecca Short is a 73 y.o. female who recently complained to Dr. 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  She presents now for evaluation.  She is doing well.  She tolerates the CPAP and feels the pressure is adequate.  She tolerates the nasal pillow mask.  She feels rested in the am and has no daytime sleepiness.  She has noticed a significant difference in how she feels since going on the CPAP.  She no longer snores.     Wt Readings from Last 3 Encounters:  01/19/14 190 lb (86.183 kg)  10/04/13 185 lb (83.915 kg)  09/22/13 189 lb (85.73 kg)     Past Medical History  Diagnosis Date  . A-fib   . Macular degeneration   . Allergy     Current Outpatient Prescriptions  Medication Sig Dispense Refill  . apixaban (ELIQUIS) 5 MG TABS tablet Take 1 tablet (5 mg total) by mouth 2 (two) times daily.  60 tablet  11  . Calcium Citrate (CITRACAL PO) Take 2 tablets by mouth daily.      . metoprolol tartrate (LOPRESSOR) 25 MG tablet Take 1 tablet (25 mg total) by mouth 2 (two) times daily.  60 tablet  11  . Multiple Vitamin (MULTIVITAMIN WITH MINERALS) TABS tablet Take 1 tablet by mouth daily.      . Multiple Vitamins-Minerals (PRESERVISION AREDS PO) Take 1 capsule by mouth 2 (two) times daily.       . ranibizumab (LUCENTIS) 0.5 MG/0.05ML SOLN 0.5 mg by Intravitreal route once. Every 12 weeks       No current facility-administered medications for this visit.    Allergies:    Allergies  Allergen Reactions  . Condrolite [Glucosamine-Chondroitin-Msm] Rash  . Glucosamine Forte  [Nutritional Supplements] Rash    Social History:  The patient  reports that she has never smoked. She does not have any smokeless tobacco history on file. She reports that she does not drink alcohol or use illicit drugs.   Family History:  The patient's family history includes Dementia in her paternal grandmother; Heart Problems in her father; Kidney failure in her brother; Osteoporosis in her mother; Stroke in her father; Suicidality in an other family member.   ROS:  Please see the history of present illness.      All other systems reviewed and negative.   PHYSICAL EXAM: VS:  BP 122/80  Pulse 54  Ht 5' 3.5" (1.613 m)  Wt 190 lb (86.183 kg)  BMI 33.12 kg/m2 Well nourished, well developed, in no acute distress HEENT: normal Neck: no JVD Cardiac:  normal S1, S2; RRR; no murmur Lungs:  clear to auscultation bilaterally, no wheezing, rhonchi or rales Abd: soft, nontender, no hepatomegaly Ext: no edema Skin: warm and dry Neuro:  CNs 2-12 intact, no focal abnormalities noted      ASSESSMENT AND PLAN:  1. Mild OSA now on CPAP at 6cm H2O and tolerating well. - I will get a download from her DME 2. Obesity  Followup with me in 6 months  Signed, Armanda Magic, MD 01/19/2014 8:38 AM

## 2014-01-25 ENCOUNTER — Ambulatory Visit (INDEPENDENT_AMBULATORY_CARE_PROVIDER_SITE_OTHER): Payer: Medicare Other | Admitting: Licensed Clinical Social Worker

## 2014-01-25 ENCOUNTER — Other Ambulatory Visit: Payer: Self-pay | Admitting: Cardiology

## 2014-01-25 DIAGNOSIS — I480 Paroxysmal atrial fibrillation: Secondary | ICD-10-CM

## 2014-01-25 DIAGNOSIS — F411 Generalized anxiety disorder: Secondary | ICD-10-CM

## 2014-02-01 ENCOUNTER — Ambulatory Visit (INDEPENDENT_AMBULATORY_CARE_PROVIDER_SITE_OTHER): Payer: Medicare Other | Admitting: Licensed Clinical Social Worker

## 2014-02-01 DIAGNOSIS — F411 Generalized anxiety disorder: Secondary | ICD-10-CM

## 2014-02-08 ENCOUNTER — Ambulatory Visit (INDEPENDENT_AMBULATORY_CARE_PROVIDER_SITE_OTHER): Payer: Medicare Other | Admitting: Licensed Clinical Social Worker

## 2014-02-08 DIAGNOSIS — F411 Generalized anxiety disorder: Secondary | ICD-10-CM

## 2014-02-15 ENCOUNTER — Ambulatory Visit: Payer: Medicare Other | Admitting: Licensed Clinical Social Worker

## 2014-02-20 ENCOUNTER — Ambulatory Visit: Payer: Medicare Other | Admitting: Licensed Clinical Social Worker

## 2014-03-01 ENCOUNTER — Ambulatory Visit (INDEPENDENT_AMBULATORY_CARE_PROVIDER_SITE_OTHER): Payer: Medicare Other | Admitting: Licensed Clinical Social Worker

## 2014-03-01 DIAGNOSIS — F419 Anxiety disorder, unspecified: Secondary | ICD-10-CM

## 2014-03-08 ENCOUNTER — Ambulatory Visit (INDEPENDENT_AMBULATORY_CARE_PROVIDER_SITE_OTHER): Payer: Medicare Other | Admitting: Licensed Clinical Social Worker

## 2014-03-08 DIAGNOSIS — F419 Anxiety disorder, unspecified: Secondary | ICD-10-CM

## 2014-03-15 ENCOUNTER — Ambulatory Visit: Payer: Medicare Other | Admitting: Licensed Clinical Social Worker

## 2014-03-15 ENCOUNTER — Ambulatory Visit (INDEPENDENT_AMBULATORY_CARE_PROVIDER_SITE_OTHER): Payer: Medicare Other | Admitting: Licensed Clinical Social Worker

## 2014-03-15 DIAGNOSIS — F419 Anxiety disorder, unspecified: Secondary | ICD-10-CM

## 2014-03-22 ENCOUNTER — Ambulatory Visit (INDEPENDENT_AMBULATORY_CARE_PROVIDER_SITE_OTHER): Payer: Medicare Other | Admitting: Licensed Clinical Social Worker

## 2014-03-22 DIAGNOSIS — F419 Anxiety disorder, unspecified: Secondary | ICD-10-CM

## 2014-03-23 ENCOUNTER — Encounter: Payer: Self-pay | Admitting: Interventional Cardiology

## 2014-03-23 ENCOUNTER — Ambulatory Visit (INDEPENDENT_AMBULATORY_CARE_PROVIDER_SITE_OTHER): Payer: Medicare Other | Admitting: Interventional Cardiology

## 2014-03-23 VITALS — BP 140/78 | HR 43 | Ht 63.5 in | Wt 196.0 lb

## 2014-03-23 DIAGNOSIS — R001 Bradycardia, unspecified: Secondary | ICD-10-CM | POA: Insufficient documentation

## 2014-03-23 DIAGNOSIS — G4733 Obstructive sleep apnea (adult) (pediatric): Secondary | ICD-10-CM

## 2014-03-23 DIAGNOSIS — I480 Paroxysmal atrial fibrillation: Secondary | ICD-10-CM

## 2014-03-23 NOTE — Patient Instructions (Signed)
Your physician recommends that you continue on your current medications as directed. Please refer to the Current Medication list given to you today.  Your physician wants you to follow-up in:   9 MONTHS WITH DR  VARANASI   You will receive a reminder letter in the mail two months in advance. If you don't receive a letter, please call our office to schedule the follow-up appointment.   

## 2014-03-23 NOTE — Progress Notes (Signed)
Patient ID: Rebecca Short, female   DOB: 08/23/1940, 73 y.o.   MRN: [id number]     [address] [place], [place]  [address] Phone: [phone] Fax:  [phone]  Date:  03/23/2014   ID:  Rebecca Short [birth year], MRN [id number]  PCP:  Mickie Hillier, MD      History of Present Illness: Rebecca Short is a 73 y.o. female who has been doing well with PAF. She is tolerating CPAP.  She has had occasional palpitations, but less AFib than before. Managing hydration and stress have helped minimize palpitations. She exercises 2-3x/week on the treadmill. The bike was bothering her back. No SHOB. SHe does some weights. No bleeding problems.   MRI done recently showed no cardiac mass.  Over the past few weeeks.  SHe has felt well.  No irregular pulse.  Some anxiety but never any associated irregular heartbeat.   She was in the ER for a few hourswhen not feeling well, but sent home.    Feels that CPAP is helping.     Wt Readings from Last 3 Encounters:  03/23/14 196 lb (88.905 kg)  01/19/14 190 lb (86.183 kg)  10/04/13 185 lb (83.915 kg)     Past Medical History  Diagnosis Date  . A-fib   . Macular degeneration   . Allergy     Current Outpatient Prescriptions  Medication Sig Dispense Refill  . apixaban (ELIQUIS) 5 MG TABS tablet Take 1 tablet (5 mg total) by mouth 2 (two) times daily.  60 tablet  11  . Calcium Citrate (CITRACAL PO) Take 2 tablets by mouth daily.      . metoprolol tartrate (LOPRESSOR) 25 MG tablet Take 1 tablet (25 mg total) by mouth 2 (two) times daily.  60 tablet  11  . Multiple Vitamin (MULTIVITAMIN WITH MINERALS) TABS tablet Take 1 tablet by mouth daily.      . Multiple Vitamins-Minerals (PRESERVISION AREDS PO) Take 1 capsule by mouth 2 (two) times daily. Eye vitamins      . ranibizumab (LUCENTIS) 0.5 MG/0.05ML SOLN 0.5 mg by Intravitreal route once. Every 12 weeks      . amoxicillin (AMOXIL) 875 MG tablet once. For dental appt.       No  current facility-administered medications for this visit.    Allergies:    Allergies  Allergen Reactions  . Condrolite [Glucosamine-Chondroitin-Msm] Rash  . Glucosamine Forte [Nutritional Supplements] Rash    Social History:  The patient  reports that she has never smoked. She does not have any smokeless tobacco history on file. She reports that she does not drink alcohol or use illicit drugs.   Family History:  The patient's family history includes Cancer in her maternal grandmother; Dementia in her paternal grandmother; Diabetes in her brother; Heart Problems in her father; Kidney failure in her brother; Osteoporosis in her mother; Stroke in her father; Suicidality in an other family member.   ROS:  Please see the history of present illness.  No nausea, vomiting.  No fevers, chills.  No focal weakness.  No dysuria.    All other systems reviewed and negative.   PHYSICAL EXAM: VS:  BP 140/78  Pulse 43  Ht 5' 3.5" (1.613 m)  Wt 196 lb (88.905 kg)  BMI 34.17 kg/m2  SpO2 96% Well nourished, well developed, in no acute distress HEENT: normal Neck: no JVD, no carotid bruits Cardiac:  normal S1, S2; RRR;  Lungs:  clear to auscultation bilaterally,  no wheezing, rhonchi or rales Abd: soft, nontender, no hepatomegaly Ext: no edema Skin: warm and dry Neuro:   no focal abnormalities noted  EKG:  AFib with RVR in the hospital 07/15/13.     ASSESSMENT AND PLAN:  A-fib  Stopped Aspirin Tablet, 325 MG, 1 tablet, Orally, Once a day Stopped Diltiazem HCl ER Beads Capsule Extended Release 24 Hour, 180 MG, 1 capsule, Orally, Once a day Notes: Minimal palpitations. Maintaining NSR. Continue regular water intake intake. Aortic sclerosis unchanged. OK to take Vit C and Vit E for macular degeneration. Agree with starting Eliquis to reduce stroke risk.  Metoprolol was started.  Dose will be limited by her resting heart rate/bradycardia.  Continue metoprolol 25 mg BID.  If she has more episodes of  AFib, can consider going up to 50 mg daily. TSH normal.  Will give her the option of taking an extra metoprolol 25 mg with an irregular pulse.  It is ok to exercise.       HR will go to the low 60s with climbing the stairs.  She does not push herself too much for fear that the AFib will recur.  She gradually increases the speed on the treadmill.   her back does not allow her to exercise more vigorously. We did briefly discuss that if she has worsening bradycardia, we may need to stop the metoprolol. This could lead to more atrial fibrillation. In that scenario, she may need a pacemaker. She currently does not have an indication for a pacemaker.                                                                                                                                                                                                                                                                                                                                             2. Macular degeneration: She needs to  eat leafy green vegetables, which would not be permittted on Coumadin, to treat her macular degeneration.  CONtinue Eliquis. Continue routine labs for NOAC.     3. Question of right atrial mass on echo.  MRI was negative.  No further w/u needed.    Notes: Lipids well controlled last year.  She also follows up with Dr. Mayford Knife for her sleep apnea.    Preventive Medicine  Adult topics discussed:  Diet: healthy diet.  Exercise: 5 days a week, at least 30 minutes of aerobic exercise.      Signed, Fredric Mare, MD, [facility] 03/23/2014 9:03 AM

## 2014-03-29 ENCOUNTER — Ambulatory Visit (INDEPENDENT_AMBULATORY_CARE_PROVIDER_SITE_OTHER): Payer: Medicare Other | Admitting: Licensed Clinical Social Worker

## 2014-03-29 DIAGNOSIS — F419 Anxiety disorder, unspecified: Secondary | ICD-10-CM

## 2014-04-05 ENCOUNTER — Ambulatory Visit (INDEPENDENT_AMBULATORY_CARE_PROVIDER_SITE_OTHER): Payer: Medicare Other | Admitting: Licensed Clinical Social Worker

## 2014-04-05 DIAGNOSIS — F419 Anxiety disorder, unspecified: Secondary | ICD-10-CM

## 2014-04-12 ENCOUNTER — Ambulatory Visit (INDEPENDENT_AMBULATORY_CARE_PROVIDER_SITE_OTHER): Payer: Medicare Other | Admitting: Licensed Clinical Social Worker

## 2014-04-12 DIAGNOSIS — F419 Anxiety disorder, unspecified: Secondary | ICD-10-CM

## 2014-04-19 ENCOUNTER — Ambulatory Visit: Payer: Medicare Other | Admitting: Licensed Clinical Social Worker

## 2014-04-26 ENCOUNTER — Ambulatory Visit (INDEPENDENT_AMBULATORY_CARE_PROVIDER_SITE_OTHER): Payer: Medicare Other | Admitting: Licensed Clinical Social Worker

## 2014-04-26 DIAGNOSIS — F419 Anxiety disorder, unspecified: Secondary | ICD-10-CM

## 2014-05-03 ENCOUNTER — Ambulatory Visit (INDEPENDENT_AMBULATORY_CARE_PROVIDER_SITE_OTHER): Payer: Medicare Other | Admitting: Licensed Clinical Social Worker

## 2014-05-03 DIAGNOSIS — F419 Anxiety disorder, unspecified: Secondary | ICD-10-CM

## 2014-05-10 ENCOUNTER — Ambulatory Visit (INDEPENDENT_AMBULATORY_CARE_PROVIDER_SITE_OTHER): Payer: Medicare Other | Admitting: Licensed Clinical Social Worker

## 2014-05-10 DIAGNOSIS — F419 Anxiety disorder, unspecified: Secondary | ICD-10-CM

## 2014-05-17 ENCOUNTER — Ambulatory Visit (INDEPENDENT_AMBULATORY_CARE_PROVIDER_SITE_OTHER): Payer: Medicare Other | Admitting: Licensed Clinical Social Worker

## 2014-05-17 DIAGNOSIS — F419 Anxiety disorder, unspecified: Secondary | ICD-10-CM

## 2014-05-31 ENCOUNTER — Ambulatory Visit (INDEPENDENT_AMBULATORY_CARE_PROVIDER_SITE_OTHER): Payer: Medicare Other | Admitting: Licensed Clinical Social Worker

## 2014-05-31 DIAGNOSIS — F419 Anxiety disorder, unspecified: Secondary | ICD-10-CM

## 2014-06-07 ENCOUNTER — Ambulatory Visit (INDEPENDENT_AMBULATORY_CARE_PROVIDER_SITE_OTHER): Payer: Medicare Other | Admitting: Licensed Clinical Social Worker

## 2014-06-07 DIAGNOSIS — F419 Anxiety disorder, unspecified: Secondary | ICD-10-CM

## 2014-06-14 ENCOUNTER — Ambulatory Visit (INDEPENDENT_AMBULATORY_CARE_PROVIDER_SITE_OTHER): Payer: Medicare Other | Admitting: Licensed Clinical Social Worker

## 2014-06-14 DIAGNOSIS — F419 Anxiety disorder, unspecified: Secondary | ICD-10-CM

## 2014-06-21 ENCOUNTER — Ambulatory Visit (INDEPENDENT_AMBULATORY_CARE_PROVIDER_SITE_OTHER): Payer: Medicare Other | Admitting: Licensed Clinical Social Worker

## 2014-06-21 DIAGNOSIS — F419 Anxiety disorder, unspecified: Secondary | ICD-10-CM

## 2014-06-28 ENCOUNTER — Ambulatory Visit (INDEPENDENT_AMBULATORY_CARE_PROVIDER_SITE_OTHER): Payer: Medicare Other | Admitting: Licensed Clinical Social Worker

## 2014-06-28 DIAGNOSIS — F419 Anxiety disorder, unspecified: Secondary | ICD-10-CM

## 2014-07-05 ENCOUNTER — Ambulatory Visit (INDEPENDENT_AMBULATORY_CARE_PROVIDER_SITE_OTHER): Payer: Medicare Other | Admitting: Licensed Clinical Social Worker

## 2014-07-05 DIAGNOSIS — F419 Anxiety disorder, unspecified: Secondary | ICD-10-CM

## 2014-07-12 ENCOUNTER — Ambulatory Visit (INDEPENDENT_AMBULATORY_CARE_PROVIDER_SITE_OTHER): Payer: Medicare Other | Admitting: Licensed Clinical Social Worker

## 2014-07-12 DIAGNOSIS — F419 Anxiety disorder, unspecified: Secondary | ICD-10-CM

## 2014-07-14 ENCOUNTER — Other Ambulatory Visit: Payer: Self-pay | Admitting: Dermatology

## 2014-07-18 ENCOUNTER — Ambulatory Visit (INDEPENDENT_AMBULATORY_CARE_PROVIDER_SITE_OTHER): Payer: Medicare Other | Admitting: Cardiology

## 2014-07-18 ENCOUNTER — Encounter: Payer: Self-pay | Admitting: Cardiology

## 2014-07-18 VITALS — BP 120/72 | HR 50 | Ht 63.5 in | Wt 198.4 lb

## 2014-07-18 DIAGNOSIS — E669 Obesity, unspecified: Secondary | ICD-10-CM

## 2014-07-18 DIAGNOSIS — G4733 Obstructive sleep apnea (adult) (pediatric): Secondary | ICD-10-CM

## 2014-07-18 NOTE — Patient Instructions (Signed)
Your physician wants you to follow-up in: 1 year with Dr. Turner. You will receive a reminder letter in the mail two months in advance. If you don't receive a letter, please call our office to schedule the follow-up appointment.  

## 2014-07-18 NOTE — Progress Notes (Addendum)
Cardiology Office Note   Date:  07/18/2014   ID:  Rebecca Short, Rebecca Short 04-07-1941, MRN [id number]  PCP:  Mickie Hillier, MD  Cardiologist:   Quintella Reichert, MD   Chief Complaint  Patient presents with  . Sleep Apnea  . Obesity      History of Present Illness: Rebecca Short is a 74 y.o. female with mild OSA  And is now on CPAP at 6cm H2O.  She is doing well. She tolerates the CPAP and feels the pressure is adequate. She tolerates the nasal pillow mask.She denies any nasal congestion or dry mouth/nose.   She feels rested in the am and has no daytime sleepiness but in the evening may dose off if she is sitting in her chair watching TV at night.  . She has noticed a significant difference in how she feels since going on the CPAP. She no longer snores.    Past Medical History  Diagnosis Date  . A-fib   . Macular degeneration   . Allergy   . OSA (obstructive sleep apnea)     mild with total AHI 7.25/hr and severe during REM sleep at 35/hr now on CPAP at 6cm H2O    Past Surgical History  Procedure Laterality Date  . Replacement total knee bilateral       Current Outpatient Prescriptions  Medication Sig Dispense Refill  . amoxicillin (AMOXIL) 875 MG tablet once. For dental appt.    Marland Kitchen apixaban (ELIQUIS) 5 MG TABS tablet Take 1 tablet (5 mg total) by mouth 2 (two) times daily. 60 tablet 11  . Calcium Citrate (CITRACAL PO) Take 2 tablets by mouth daily.    Marland Kitchen EPIPEN 2-PAK 0.3 MG/0.3ML SOAJ injection   0  . metoprolol tartrate (LOPRESSOR) 25 MG tablet Take 1 tablet (25 mg total) by mouth 2 (two) times daily. 60 tablet 11  . Multiple Vitamin (MULTIVITAMIN WITH MINERALS) TABS tablet Take 1 tablet by mouth daily.    . Multiple Vitamins-Minerals (PRESERVISION AREDS PO) Take 1 capsule by mouth 2 (two) times daily. Eye vitamins     No current facility-administered medications for this visit.    Allergies:   Condrolite and Glucosamine forte    Social History:  The patient   reports that she has never smoked. She does not have any smokeless tobacco history on file. She reports that she does not drink alcohol or use illicit drugs.   Family History:  The patient's family history includes Cancer in her maternal grandmother; Dementia in her paternal grandmother; Diabetes in her brother; Heart Problems in her father; Kidney failure in her brother; Osteoporosis in her mother; Stroke in her father; Suicidality in an other family member.    ROS:  Please see the history of present illness.   Otherwise, review of systems are positive for none.   All other systems are reviewed and negative.    PHYSICAL EXAM: VS:  BP 120/72 mmHg  Pulse 50  Ht 5' 3.5" (1.613 m)  Wt 198 lb 6.4 oz (89.994 kg)  BMI 34.59 kg/m2  SpO2 96% , BMI Body mass index is 34.59 kg/(m^2). GEN: Well nourished, well developed, in no acute distress HEENT: normal Neck: no JVD, carotid bruits, or masses Cardiac: RRR; no murmurs, rubs, or gallops,no edema  Respiratory:  clear to auscultation bilaterally, normal work of breathing GI: soft, nontender, nondistended, + BS MS: no deformity or atrophy Skin: warm and dry, no rash Neuro:  Strength and sensation are intact Psych: euthymic  mood, full affect   EKG:  EKG is not ordered today.    Recent Labs: 07/25/2013: TSH 1.22 10/04/2013: ALT 19 01/19/2014: BUN 14; Creatinine 0.7; Hemoglobin 14.3; Platelets 230.0; Potassium 4.1; Sodium 137    Lipid Panel No results found for: CHOL, TRIG, HDL, CHOLHDL, VLDL, LDLCALC, LDLDIRECT    Wt Readings from Last 3 Encounters:  07/18/14 198 lb 6.4 oz (89.994 kg)  03/23/14 196 lb (88.905 kg)  01/19/14 190 lb (86.183 kg)      ASSESSMENT AND PLAN:  1. Mild OSA now on CPAP at 6cm H2O and tolerating well.  Her d/l today showed an AHIo f 1.3/hr on 6cm H2O and uses her CPAP 100% of the time with an average usage of 8 hours and 41 minutes.   2. Obesity    Current medicines are reviewed at length with the patient  today.  The patient does not have concerns regarding medicines.  The following changes have been made:  no change  Labs/ tests ordered today include: None  No orders of the defined types were placed in this encounter.     Disposition:   FU with me in 1 year   Signed, Quintella Reichert, MD  07/18/2014 11:12 AM    [facility] [address] [place], [place], [place]  [address] Phone: [phone]; Fax: [phone]

## 2014-07-19 ENCOUNTER — Ambulatory Visit (INDEPENDENT_AMBULATORY_CARE_PROVIDER_SITE_OTHER): Payer: Medicare Other | Admitting: Licensed Clinical Social Worker

## 2014-07-19 DIAGNOSIS — F419 Anxiety disorder, unspecified: Secondary | ICD-10-CM

## 2014-07-24 ENCOUNTER — Other Ambulatory Visit (INDEPENDENT_AMBULATORY_CARE_PROVIDER_SITE_OTHER): Payer: Medicare Other | Admitting: *Deleted

## 2014-07-24 DIAGNOSIS — I480 Paroxysmal atrial fibrillation: Secondary | ICD-10-CM

## 2014-07-24 LAB — BASIC METABOLIC PANEL
BUN: 19 mg/dL (ref 6–23)
CALCIUM: 9.4 mg/dL (ref 8.4–10.5)
CO2: 30 mEq/L (ref 19–32)
CREATININE: 0.79 mg/dL (ref 0.40–1.20)
Chloride: 105 mEq/L (ref 96–112)
GFR: 75.52 mL/min (ref >60.00)
Glucose, Bld: 85 mg/dL (ref 70–99)
Potassium: 4.2 mEq/L (ref 3.5–5.1)
Sodium: 138 mEq/L (ref 135–145)

## 2014-07-24 LAB — CBC
HCT: 44.6 % (ref 36.0–46.0)
HEMOGLOBIN: 14.7 g/dL (ref 12.0–15.0)
MCHC: 33 g/dL (ref 30.0–36.0)
MCV: 89.3 fl (ref 78.0–100.0)
Platelets: 216 10*3/uL (ref 150.0–400.0)
RBC: 5 Mil/uL (ref 3.87–5.11)
RDW: 14.3 % (ref 11.5–15.5)
WBC: 6.1 10*3/uL (ref 4.0–10.5)

## 2014-07-25 ENCOUNTER — Encounter: Payer: Self-pay | Admitting: Cardiology

## 2014-07-26 ENCOUNTER — Ambulatory Visit: Payer: PRIVATE HEALTH INSURANCE | Admitting: Licensed Clinical Social Worker

## 2014-08-02 ENCOUNTER — Ambulatory Visit (INDEPENDENT_AMBULATORY_CARE_PROVIDER_SITE_OTHER): Payer: Medicare Other | Admitting: Licensed Clinical Social Worker

## 2014-08-02 DIAGNOSIS — F419 Anxiety disorder, unspecified: Secondary | ICD-10-CM | POA: Diagnosis not present

## 2014-08-09 ENCOUNTER — Ambulatory Visit: Payer: PRIVATE HEALTH INSURANCE | Admitting: Licensed Clinical Social Worker

## 2014-08-14 ENCOUNTER — Ambulatory Visit (INDEPENDENT_AMBULATORY_CARE_PROVIDER_SITE_OTHER): Payer: Medicare Other | Admitting: Licensed Clinical Social Worker

## 2014-08-14 DIAGNOSIS — F419 Anxiety disorder, unspecified: Secondary | ICD-10-CM | POA: Diagnosis not present

## 2014-08-21 ENCOUNTER — Other Ambulatory Visit: Payer: Medicare Other

## 2014-08-23 ENCOUNTER — Ambulatory Visit (INDEPENDENT_AMBULATORY_CARE_PROVIDER_SITE_OTHER): Payer: Medicare Other | Admitting: Licensed Clinical Social Worker

## 2014-08-23 DIAGNOSIS — F419 Anxiety disorder, unspecified: Secondary | ICD-10-CM | POA: Diagnosis not present

## 2014-08-30 ENCOUNTER — Ambulatory Visit (INDEPENDENT_AMBULATORY_CARE_PROVIDER_SITE_OTHER): Payer: Medicare Other | Admitting: Licensed Clinical Social Worker

## 2014-08-30 DIAGNOSIS — F419 Anxiety disorder, unspecified: Secondary | ICD-10-CM

## 2014-09-06 ENCOUNTER — Ambulatory Visit: Payer: PRIVATE HEALTH INSURANCE | Admitting: Licensed Clinical Social Worker

## 2014-09-08 ENCOUNTER — Ambulatory Visit (INDEPENDENT_AMBULATORY_CARE_PROVIDER_SITE_OTHER): Payer: Medicare Other | Admitting: Licensed Clinical Social Worker

## 2014-09-08 DIAGNOSIS — F419 Anxiety disorder, unspecified: Secondary | ICD-10-CM

## 2014-09-13 ENCOUNTER — Ambulatory Visit (INDEPENDENT_AMBULATORY_CARE_PROVIDER_SITE_OTHER): Payer: Medicare Other | Admitting: Licensed Clinical Social Worker

## 2014-09-13 DIAGNOSIS — F419 Anxiety disorder, unspecified: Secondary | ICD-10-CM

## 2014-09-20 ENCOUNTER — Ambulatory Visit (INDEPENDENT_AMBULATORY_CARE_PROVIDER_SITE_OTHER): Payer: Medicare Other | Admitting: Licensed Clinical Social Worker

## 2014-09-20 DIAGNOSIS — F419 Anxiety disorder, unspecified: Secondary | ICD-10-CM | POA: Diagnosis not present

## 2014-09-24 ENCOUNTER — Other Ambulatory Visit: Payer: Self-pay | Admitting: Interventional Cardiology

## 2014-09-27 ENCOUNTER — Ambulatory Visit (INDEPENDENT_AMBULATORY_CARE_PROVIDER_SITE_OTHER): Payer: Medicare Other | Admitting: Licensed Clinical Social Worker

## 2014-09-27 DIAGNOSIS — F419 Anxiety disorder, unspecified: Secondary | ICD-10-CM | POA: Diagnosis not present

## 2014-10-04 ENCOUNTER — Ambulatory Visit (INDEPENDENT_AMBULATORY_CARE_PROVIDER_SITE_OTHER): Payer: Medicare Other | Admitting: Licensed Clinical Social Worker

## 2014-10-04 DIAGNOSIS — F419 Anxiety disorder, unspecified: Secondary | ICD-10-CM

## 2014-10-11 ENCOUNTER — Ambulatory Visit (INDEPENDENT_AMBULATORY_CARE_PROVIDER_SITE_OTHER): Payer: Medicare Other | Admitting: Licensed Clinical Social Worker

## 2014-10-11 DIAGNOSIS — F419 Anxiety disorder, unspecified: Secondary | ICD-10-CM

## 2014-10-17 ENCOUNTER — Telehealth: Payer: Self-pay | Admitting: Interventional Cardiology

## 2014-10-17 DIAGNOSIS — I4891 Unspecified atrial fibrillation: Secondary | ICD-10-CM

## 2014-10-17 NOTE — Telephone Encounter (Signed)
New Message  Pt c/o of being in A-fib for the last week. Pt says it usually settles down, but not in this case. Pt requests to speak w/ the Rn. _Please call back and discuss.

## 2014-10-17 NOTE — Telephone Encounter (Signed)
Pt states that in the past she has had episodes of Afib that would come and go but for the last week she seems to be having these episodes more often and they are lasting longer. Pt states that she has been more physically active lately and believes this may be why they are occuring. Pt states that she was very careful with her activity yesterday and the feeling of Afib seemed to settle down. Pt denies CP, SOB, dizziness or lightheadedness. Pt unable to check BP or pulse at home, states she "just feels it running fast". Informed pt that I would send this information to Dr. Eldridge Dace for review and advisement. Pt verbalized understanding.

## 2014-10-17 NOTE — Telephone Encounter (Signed)
Would have her wear a 30 day monitor if she is willing to better quantify frequency of AFib.

## 2014-10-18 ENCOUNTER — Ambulatory Visit (INDEPENDENT_AMBULATORY_CARE_PROVIDER_SITE_OTHER): Payer: Medicare Other | Admitting: Licensed Clinical Social Worker

## 2014-10-18 DIAGNOSIS — F419 Anxiety disorder, unspecified: Secondary | ICD-10-CM

## 2014-10-18 NOTE — Telephone Encounter (Signed)
Informed pt of new order for 30 day monitor. Pt in agreement with this plan. Informed pt that someone from our [facility] team will contact her to schedule having this put on. Pt verbalized understanding.

## 2014-10-18 NOTE — Telephone Encounter (Signed)
Left message to call back  

## 2014-10-18 NOTE — Telephone Encounter (Signed)
Follow Up ° ° ° ° ° ° °Returning Jennifer's phone call. °

## 2014-10-19 ENCOUNTER — Encounter: Payer: Self-pay | Admitting: Interventional Cardiology

## 2014-10-20 ENCOUNTER — Ambulatory Visit (INDEPENDENT_AMBULATORY_CARE_PROVIDER_SITE_OTHER): Payer: Medicare Other

## 2014-10-20 DIAGNOSIS — I4891 Unspecified atrial fibrillation: Secondary | ICD-10-CM | POA: Diagnosis not present

## 2014-10-25 ENCOUNTER — Ambulatory Visit (INDEPENDENT_AMBULATORY_CARE_PROVIDER_SITE_OTHER): Payer: Medicare Other | Admitting: Licensed Clinical Social Worker

## 2014-10-25 DIAGNOSIS — F419 Anxiety disorder, unspecified: Secondary | ICD-10-CM

## 2014-11-01 ENCOUNTER — Ambulatory Visit (INDEPENDENT_AMBULATORY_CARE_PROVIDER_SITE_OTHER): Payer: Medicare Other | Admitting: Licensed Clinical Social Worker

## 2014-11-01 DIAGNOSIS — F419 Anxiety disorder, unspecified: Secondary | ICD-10-CM | POA: Diagnosis not present

## 2014-11-08 ENCOUNTER — Ambulatory Visit: Payer: PRIVATE HEALTH INSURANCE | Admitting: Licensed Clinical Social Worker

## 2014-11-13 ENCOUNTER — Telehealth: Payer: Self-pay | Admitting: *Deleted

## 2014-11-13 ENCOUNTER — Telehealth: Payer: Self-pay | Admitting: Interventional Cardiology

## 2014-11-13 NOTE — Telephone Encounter (Signed)
New problem\    Pt want to complain about the company that supply the monitors.

## 2014-11-13 NOTE — Telephone Encounter (Signed)
Patient had poor experience with [employer]  Monitor service.  According to Rebecca Short a [employer] officer was having a private conversation with a coworker while she had Rebecca Short on the phone resetting her monitor.  She was also told they would ship out a holster to replace a broken/defective one on Thursday, priority overnight.  As of Monday afternoon, the part has not been delivered.  She called [employer] Monday and was told it would be delivered Tuesday afternoon.  Since the holster is used to hold the monitor by clipping it onto the waistband,  It is very difficult to wear the monitor without one.   I informed Rebecca Short I would contact our rep and relay this information.   I thanked Rebecca Short for  her feedback and commended her on giving this company feedback which could potentially improve their customer service.  I also informed Rebecca Short if she did not receive her holter in the mail Tues. 11-14-14, I would pull one from another kit for her.

## 2014-11-15 ENCOUNTER — Ambulatory Visit (INDEPENDENT_AMBULATORY_CARE_PROVIDER_SITE_OTHER): Payer: Medicare Other | Admitting: Licensed Clinical Social Worker

## 2014-11-15 DIAGNOSIS — F419 Anxiety disorder, unspecified: Secondary | ICD-10-CM

## 2014-11-20 ENCOUNTER — Other Ambulatory Visit: Payer: Self-pay | Admitting: Interventional Cardiology

## 2014-11-22 ENCOUNTER — Ambulatory Visit (INDEPENDENT_AMBULATORY_CARE_PROVIDER_SITE_OTHER): Payer: Medicare Other | Admitting: Licensed Clinical Social Worker

## 2014-11-22 DIAGNOSIS — F419 Anxiety disorder, unspecified: Secondary | ICD-10-CM | POA: Diagnosis not present

## 2014-11-29 ENCOUNTER — Ambulatory Visit (INDEPENDENT_AMBULATORY_CARE_PROVIDER_SITE_OTHER): Payer: Medicare Other | Admitting: Licensed Clinical Social Worker

## 2014-11-29 DIAGNOSIS — F419 Anxiety disorder, unspecified: Secondary | ICD-10-CM | POA: Diagnosis not present

## 2014-12-06 ENCOUNTER — Ambulatory Visit (INDEPENDENT_AMBULATORY_CARE_PROVIDER_SITE_OTHER): Payer: Medicare Other | Admitting: Licensed Clinical Social Worker

## 2014-12-06 DIAGNOSIS — F419 Anxiety disorder, unspecified: Secondary | ICD-10-CM

## 2014-12-13 ENCOUNTER — Ambulatory Visit (INDEPENDENT_AMBULATORY_CARE_PROVIDER_SITE_OTHER): Payer: Medicare Other | Admitting: Licensed Clinical Social Worker

## 2014-12-13 DIAGNOSIS — F419 Anxiety disorder, unspecified: Secondary | ICD-10-CM | POA: Diagnosis not present

## 2014-12-20 ENCOUNTER — Ambulatory Visit (INDEPENDENT_AMBULATORY_CARE_PROVIDER_SITE_OTHER): Payer: Medicare Other | Admitting: Licensed Clinical Social Worker

## 2014-12-20 ENCOUNTER — Other Ambulatory Visit: Payer: Self-pay | Admitting: Interventional Cardiology

## 2014-12-20 DIAGNOSIS — F419 Anxiety disorder, unspecified: Secondary | ICD-10-CM | POA: Diagnosis not present

## 2014-12-27 ENCOUNTER — Ambulatory Visit (INDEPENDENT_AMBULATORY_CARE_PROVIDER_SITE_OTHER): Payer: Medicare Other | Admitting: Licensed Clinical Social Worker

## 2014-12-27 DIAGNOSIS — F419 Anxiety disorder, unspecified: Secondary | ICD-10-CM

## 2015-01-03 ENCOUNTER — Ambulatory Visit (INDEPENDENT_AMBULATORY_CARE_PROVIDER_SITE_OTHER): Payer: Medicare Other | Admitting: Licensed Clinical Social Worker

## 2015-01-03 DIAGNOSIS — F419 Anxiety disorder, unspecified: Secondary | ICD-10-CM | POA: Diagnosis not present

## 2015-01-10 ENCOUNTER — Ambulatory Visit (INDEPENDENT_AMBULATORY_CARE_PROVIDER_SITE_OTHER): Payer: Medicare Other | Admitting: Licensed Clinical Social Worker

## 2015-01-10 DIAGNOSIS — F419 Anxiety disorder, unspecified: Secondary | ICD-10-CM | POA: Diagnosis not present

## 2015-01-31 ENCOUNTER — Ambulatory Visit: Payer: PRIVATE HEALTH INSURANCE | Admitting: Licensed Clinical Social Worker

## 2015-01-31 ENCOUNTER — Encounter: Payer: Self-pay | Admitting: Interventional Cardiology

## 2015-01-31 ENCOUNTER — Other Ambulatory Visit: Payer: Self-pay | Admitting: Family Medicine

## 2015-01-31 ENCOUNTER — Ambulatory Visit
Admission: RE | Admit: 2015-01-31 | Discharge: 2015-01-31 | Disposition: A | Discharge status: Home / Self Care | Payer: Medicare Other | Source: Ambulatory Visit | Attending: Family Medicine | Admitting: Family Medicine

## 2015-01-31 ENCOUNTER — Ambulatory Visit (INDEPENDENT_AMBULATORY_CARE_PROVIDER_SITE_OTHER): Payer: Medicare Other | Admitting: Interventional Cardiology

## 2015-01-31 VITALS — BP 142/74 | HR 47 | Ht 63.5 in | Wt 200.8 lb

## 2015-01-31 DIAGNOSIS — R001 Bradycardia, unspecified: Secondary | ICD-10-CM

## 2015-01-31 DIAGNOSIS — H3530 Unspecified macular degeneration: Secondary | ICD-10-CM

## 2015-01-31 DIAGNOSIS — I4891 Unspecified atrial fibrillation: Secondary | ICD-10-CM

## 2015-01-31 DIAGNOSIS — M5441 Lumbago with sciatica, right side: Secondary | ICD-10-CM

## 2015-01-31 MED ORDER — APIXABAN 5 MG PO TABS: 5.0000 mg | ORAL_TABLET | Freq: Two times a day (BID) | ORAL | Status: DC

## 2015-01-31 MED ORDER — METOPROLOL TARTRATE 25 MG PO TABS: 25.0000 mg | ORAL_TABLET | Freq: Two times a day (BID) | ORAL | Status: DC

## 2015-01-31 NOTE — Addendum Note (Signed)
**Note De-Identified Rebecca Short Obfuscation** Addended by: Demetrios Loll on: 01/31/2015 11:13 AM   Modules accepted: Orders

## 2015-01-31 NOTE — Patient Instructions (Signed)
**Note De-Identified  Obfuscation** Medication Instructions:  You may take Tylenol for pain. No changes in your medications at this time.  Labwork: None  Testing/Procedures: None  Follow-Up: Your physician wants you to follow-up in: 9 months. You will receive a reminder letter in the mail two months in advance. If you don't receive a letter, please call our office to schedule the follow-up appointment.

## 2015-01-31 NOTE — Progress Notes (Signed)
Patient ID: Rebecca Short, female   DOB: [birth year], 74 y.o.   MRN: [id number]     Cardiology Office Note   Date:  01/31/2015   ID:  Rebecca Short, DOB 1941/03/27, MRN [id number]  PCP:  Mickie Hillier, MD    No chief complaint on file.  Follow-up atrial fibrillation  Wt Readings from Last 3 Encounters:  01/31/15 200 lb 12.8 oz (91.082 kg)  07/18/14 198 lb 6.4 oz (89.994 kg)  03/23/14 196 lb (88.905 kg)       History of Present Illness: Rebecca Short is a 74 y.o. female who has been doing well with PAF. She is tolerating CPAP. She has had occasional palpitations, but less AFib than before. Managing hydration and stress have helped minimize palpitations. She exercises 2-3x/week on the treadmill as her back allows. The bike was bothering her back. No SHOB. SHe does some weights. No bleeding problems.    She was recently in [place] and did a lot of walking with no problems.  She walked up lighthouses and hiked some trails without any cardiac sx.  She did have a fall and sprained her wrist.  No AFib sx.  No syncope.  She tripped over the root of a tree.  No sx of bradycardia.  She made a point to stay well hydrated.    Past Medical History  Diagnosis Date  . A-fib   . Macular degeneration   . Allergy   . OSA (obstructive sleep apnea)     mild with total AHI 7.25/hr and severe during REM sleep at 35/hr now on CPAP at 6cm H2O    Past Surgical History  Procedure Laterality Date  . Replacement total knee bilateral       Current Outpatient Prescriptions  Medication Sig Dispense Refill  . amoxicillin (AMOXIL) 875 MG tablet Take two (2) hours prior to dental procedures by mouth patient unsure of dose    . apixaban (ELIQUIS) 5 MG TABS tablet Take 5 mg by mouth 2 (two) times daily.    . calcium citrate-vitamin D (CITRACAL+D) 315-200 MG-UNIT per tablet Take 2 tablets by mouth daily.    Marland Kitchen EPIPEN 2-PAK 0.3 MG/0.3ML SOAJ injection Inject 0.3 mg into the muscle as needed  (anaphylaxis).   0  . metoprolol tartrate (LOPRESSOR) 25 MG tablet Take 25 mg by mouth 2 (two) times daily.    . Multiple Vitamin (MULTIVITAMIN WITH MINERALS) TABS tablet Take 1 tablet by mouth daily.    . Multiple Vitamins-Minerals (PRESERVISION AREDS PO) Take 1 capsule by mouth 2 (two) times daily. Eye vitamins     No current facility-administered medications for this visit.    Allergies:   Condrolite and Glucosamine forte    Social History:  The patient  reports that she has never smoked. She has never used smokeless tobacco. She reports that she does not drink alcohol or use illicit drugs.   Family History:  The patient's family history includes Cancer in her maternal grandmother; Dementia in her paternal grandmother; Diabetes in her brother; Heart Problems in her father; Kidney failure in her brother; Osteoporosis in her mother; Stroke in her father; Suicidality in an other family member.    ROS:  Please see the history of present illness.   Otherwise, review of systems are positive for left wrist brace.   All other systems are reviewed and negative.    PHYSICAL EXAM: VS:  BP 142/74 mmHg  Pulse 47  Ht 5' 3.5" (1.613 m)  Wt 200  lb 12.8 oz (91.082 kg)  BMI 35.01 kg/m2 , BMI Body mass index is 35.01 kg/(m^2). GEN: Well nourished, well developed, in no acute distress HEENT: normal Neck: no JVD, carotid bruits, or masses Cardiac: bradycardic,; no murmurs, rubs, or gallops,no edema  Respiratory:  clear to auscultation bilaterally, normal work of breathing GI: soft, nontender, nondistended, + BS MS: no deformity or atrophy; bruised left wrist Skin: warm and dry, no rash Neuro:  Strength and sensation are intact Psych: euthymic mood, full affect   EKG:   The ekg ordered today demonstrates sinus bradycardia, anterior changes, unchanged from prior   Recent Labs: 07/24/2014: BUN 19; Creatinine, Ser 0.79; Hemoglobin 14.7; Platelets 216.0; Potassium 4.2; Sodium 138   Lipid  Panel No results found for: CHOL, TRIG, HDL, CHOLHDL, VLDL, LDLCALC, LDLDIRECT   Other studies Reviewed: Additional studies/ records that were reviewed today with results demonstrating: .   ASSESSMENT AND PLAN:  1. AFib: Maintaining sinus rhythm. Eliquis for stroke prevention. Symptoms are well controlled. No bleeding problems.  Continue regular exercise. If she does have atrial fibrillation that sustains, she can take an extra half tablet of metoprolol. She is bradycardic at rest. No symptoms of bradycardia. No indication for pacemaker at this time. 2. Macular degeneration: She prefers Eliquis to Coumadin because it allows her to eat leafy green vegetables which are required for her macular degeneration therapy. 3. OSA: She follows up with Dr. Mayford Knife.   Current medicines are reviewed at length with the patient today.  The patient concerns regarding her medicines were addressed.  The following changes have been made:  No change  Labs/ tests ordered today include:  No orders of the defined types were placed in this encounter.    Recommend 150 minutes/week of aerobic exercise Low fat, low carb, high fiber diet recommended  Disposition:   FU in 9 months   Delorise Jackson., MD  01/31/2015 9:50 AM    [facility] [address] [place], [place], [place]  [address] Phone: [phone]; Fax: [phone]

## 2015-02-02 ENCOUNTER — Other Ambulatory Visit: Payer: Self-pay | Admitting: Family Medicine

## 2015-02-02 DIAGNOSIS — R9389 Abnormal findings on diagnostic imaging of other specified body structures: Secondary | ICD-10-CM

## 2015-02-06 ENCOUNTER — Ambulatory Visit
Admission: RE | Admit: 2015-02-06 | Discharge: 2015-02-06 | Disposition: A | Discharge status: Home / Self Care | Payer: Medicare Other | Source: Ambulatory Visit | Attending: Family Medicine | Admitting: Family Medicine

## 2015-02-06 DIAGNOSIS — R9389 Abnormal findings on diagnostic imaging of other specified body structures: Secondary | ICD-10-CM

## 2015-02-07 ENCOUNTER — Ambulatory Visit: Payer: PRIVATE HEALTH INSURANCE | Admitting: Licensed Clinical Social Worker

## 2015-02-14 ENCOUNTER — Ambulatory Visit (INDEPENDENT_AMBULATORY_CARE_PROVIDER_SITE_OTHER): Payer: Medicare Other | Admitting: Licensed Clinical Social Worker

## 2015-02-14 DIAGNOSIS — F419 Anxiety disorder, unspecified: Secondary | ICD-10-CM

## 2015-02-21 ENCOUNTER — Ambulatory Visit (INDEPENDENT_AMBULATORY_CARE_PROVIDER_SITE_OTHER): Payer: Medicare Other | Admitting: Licensed Clinical Social Worker

## 2015-02-21 DIAGNOSIS — F419 Anxiety disorder, unspecified: Secondary | ICD-10-CM | POA: Diagnosis not present

## 2015-02-28 ENCOUNTER — Ambulatory Visit (INDEPENDENT_AMBULATORY_CARE_PROVIDER_SITE_OTHER): Payer: Medicare Other | Admitting: Licensed Clinical Social Worker

## 2015-02-28 DIAGNOSIS — F419 Anxiety disorder, unspecified: Secondary | ICD-10-CM

## 2015-03-07 ENCOUNTER — Ambulatory Visit: Payer: Medicare Other | Admitting: Licensed Clinical Social Worker

## 2015-03-14 ENCOUNTER — Ambulatory Visit (INDEPENDENT_AMBULATORY_CARE_PROVIDER_SITE_OTHER): Payer: Medicare Other | Admitting: Licensed Clinical Social Worker

## 2015-03-14 DIAGNOSIS — F419 Anxiety disorder, unspecified: Secondary | ICD-10-CM

## 2015-03-28 ENCOUNTER — Ambulatory Visit (INDEPENDENT_AMBULATORY_CARE_PROVIDER_SITE_OTHER): Payer: Medicare Other | Admitting: Licensed Clinical Social Worker

## 2015-03-28 DIAGNOSIS — F419 Anxiety disorder, unspecified: Secondary | ICD-10-CM | POA: Diagnosis not present

## 2015-04-11 ENCOUNTER — Ambulatory Visit (INDEPENDENT_AMBULATORY_CARE_PROVIDER_SITE_OTHER): Payer: Medicare Other | Admitting: Licensed Clinical Social Worker

## 2015-04-11 DIAGNOSIS — F419 Anxiety disorder, unspecified: Secondary | ICD-10-CM | POA: Diagnosis not present

## 2015-04-25 ENCOUNTER — Ambulatory Visit (INDEPENDENT_AMBULATORY_CARE_PROVIDER_SITE_OTHER): Payer: Medicare Other | Admitting: Licensed Clinical Social Worker

## 2015-04-25 DIAGNOSIS — F419 Anxiety disorder, unspecified: Secondary | ICD-10-CM | POA: Diagnosis not present

## 2015-05-07 ENCOUNTER — Other Ambulatory Visit: Payer: Self-pay | Admitting: Orthopedic Surgery

## 2015-05-07 DIAGNOSIS — M25532 Pain in left wrist: Secondary | ICD-10-CM

## 2015-06-06 ENCOUNTER — Ambulatory Visit (INDEPENDENT_AMBULATORY_CARE_PROVIDER_SITE_OTHER): Payer: Medicare Other | Admitting: Licensed Clinical Social Worker

## 2015-06-06 DIAGNOSIS — F419 Anxiety disorder, unspecified: Secondary | ICD-10-CM

## 2015-06-08 ENCOUNTER — Telehealth: Payer: Self-pay | Admitting: Interventional Cardiology

## 2015-06-08 NOTE — Telephone Encounter (Signed)
NeW Message  Pt requested to speak w/ RN concerning upcoming surgery- and a possible MRI for her wrist. Please call before 11am or after 330pm. Please call back and discuss.

## 2015-06-08 NOTE — Telephone Encounter (Signed)
**Note De-Identified  Obfuscation** The pt is requesting a call once we receive the fax from her Gastroenterologist for Eliquis instructions to give her Dr Hoyle Barr recommendations.

## 2015-06-20 ENCOUNTER — Ambulatory Visit (INDEPENDENT_AMBULATORY_CARE_PROVIDER_SITE_OTHER): Payer: Medicare Other | Admitting: Licensed Clinical Social Worker

## 2015-06-20 DIAGNOSIS — F419 Anxiety disorder, unspecified: Secondary | ICD-10-CM

## 2015-07-05 ENCOUNTER — Telehealth: Payer: Self-pay | Admitting: Interventional Cardiology

## 2015-07-05 NOTE — Telephone Encounter (Signed)
Follow up    Returning call back to nurse from yesterday  

## 2015-07-05 NOTE — Telephone Encounter (Signed)
Pt wants Dr Hoyle Barr recommendations for meds, I can not find cardiac clearance, pt was told Larita Fife Via LPN for Dr Isabel Caprice will call her tomorrow when she is back in office, pt accepting of plan.

## 2015-07-06 NOTE — Telephone Encounter (Signed)
The pt is advised and she verbalized understanding. 

## 2015-07-18 ENCOUNTER — Ambulatory Visit (INDEPENDENT_AMBULATORY_CARE_PROVIDER_SITE_OTHER): Payer: Medicare Other | Admitting: Licensed Clinical Social Worker

## 2015-07-18 DIAGNOSIS — F419 Anxiety disorder, unspecified: Secondary | ICD-10-CM

## 2015-08-01 ENCOUNTER — Ambulatory Visit (INDEPENDENT_AMBULATORY_CARE_PROVIDER_SITE_OTHER): Payer: Medicare Other | Admitting: Licensed Clinical Social Worker

## 2015-08-01 DIAGNOSIS — F419 Anxiety disorder, unspecified: Secondary | ICD-10-CM

## 2015-08-10 ENCOUNTER — Other Ambulatory Visit: Payer: Self-pay | Admitting: Gastroenterology

## 2015-08-15 ENCOUNTER — Ambulatory Visit (INDEPENDENT_AMBULATORY_CARE_PROVIDER_SITE_OTHER): Payer: Medicare Other | Admitting: Licensed Clinical Social Worker

## 2015-08-15 DIAGNOSIS — F419 Anxiety disorder, unspecified: Secondary | ICD-10-CM | POA: Diagnosis not present

## 2015-08-27 ENCOUNTER — Encounter: Payer: Self-pay | Admitting: Interventional Cardiology

## 2015-08-29 ENCOUNTER — Ambulatory Visit (INDEPENDENT_AMBULATORY_CARE_PROVIDER_SITE_OTHER): Payer: Medicare Other | Admitting: Licensed Clinical Social Worker

## 2015-08-29 DIAGNOSIS — F419 Anxiety disorder, unspecified: Secondary | ICD-10-CM

## 2015-09-12 ENCOUNTER — Ambulatory Visit (INDEPENDENT_AMBULATORY_CARE_PROVIDER_SITE_OTHER): Payer: Medicare Other | Admitting: Licensed Clinical Social Worker

## 2015-09-12 DIAGNOSIS — F419 Anxiety disorder, unspecified: Secondary | ICD-10-CM | POA: Diagnosis not present

## 2015-09-19 ENCOUNTER — Ambulatory Visit (INDEPENDENT_AMBULATORY_CARE_PROVIDER_SITE_OTHER): Payer: Medicare Other | Admitting: Licensed Clinical Social Worker

## 2015-09-19 DIAGNOSIS — F419 Anxiety disorder, unspecified: Secondary | ICD-10-CM | POA: Diagnosis not present

## 2015-10-10 ENCOUNTER — Ambulatory Visit (INDEPENDENT_AMBULATORY_CARE_PROVIDER_SITE_OTHER): Payer: Medicare Other | Admitting: Licensed Clinical Social Worker

## 2015-10-10 DIAGNOSIS — F419 Anxiety disorder, unspecified: Secondary | ICD-10-CM

## 2015-10-24 ENCOUNTER — Ambulatory Visit (INDEPENDENT_AMBULATORY_CARE_PROVIDER_SITE_OTHER): Payer: Medicare Other | Admitting: Licensed Clinical Social Worker

## 2015-10-24 DIAGNOSIS — F419 Anxiety disorder, unspecified: Secondary | ICD-10-CM | POA: Diagnosis not present

## 2015-11-07 ENCOUNTER — Ambulatory Visit (INDEPENDENT_AMBULATORY_CARE_PROVIDER_SITE_OTHER): Payer: Medicare Other | Admitting: Licensed Clinical Social Worker

## 2015-11-07 DIAGNOSIS — F419 Anxiety disorder, unspecified: Secondary | ICD-10-CM

## 2015-11-07 NOTE — Progress Notes (Signed)
Patient ID: Rebecca Short, female   DOB: May 04, 1941, 75 y.o.   MRN: [id number]     Cardiology Office Note   Date:  11/09/2015   ID:  Rebecca Short, DOB Oct 14, 1940, MRN [id number]  PCP:  Mickie Hillier, MD    No chief complaint on file.  Follow-up atrial fibrillation  Wt Readings from Last 3 Encounters:  11/09/15 205 lb 12.8 oz (93.35 kg)  01/31/15 200 lb 12.8 oz (91.082 kg)  07/18/14 198 lb 6.4 oz (89.994 kg)       History of Present Illness: Rebecca Short is a 75 y.o. female who has been doing well with PAF. She is tolerating CPAP. She has had occasional palpitations, but less AFib than before. Managing hydration and stress have helped minimize palpitations. She exercises 2-3x/week on the treadmill as her back allows. The bike was bothering her back. No SHOB. SHe does some weights. No bleeding problems.  In 2016, he was in [place] and did a lot of walking with no problems.  She walked up light[place] and hiked some trails without any cardiac sx.  She did have a fall and sprained her wrist.  No AFib sx.  No syncope.  She tripped over the root of a tree.  No sx of bradycardia.  She made a point to stay well hydrated.  She is handling stress better, including "from [place]."  Less AFib with taking the meds on time.    She had a fall last year.  Her back is better.  She is going to start more exercise soon.  She has been doing some stretching exercises.  Her back tolerates this.    Tolerated colonoscopy without AFib.  Had to hold Eliquis for a few days.     Past Medical History  Diagnosis Date  . A-fib (HCC)   . Macular degeneration   . Allergy   . OSA (obstructive sleep apnea)     mild with total AHI 7.25/hr and severe during REM sleep at 35/hr now on CPAP at 6cm H2O    Past Surgical History  Procedure Laterality Date  . Replacement total knee bilateral       Current Outpatient Prescriptions  Medication Sig Dispense Refill  . apixaban (ELIQUIS) 5 MG TABS tablet Take 1  tablet (5 mg total) by mouth 2 (two) times daily. 60 tablet 9  . calcium citrate-vitamin D (CITRACAL+D) 315-200 MG-UNIT per tablet Take 2 tablets by mouth daily.    Marland Kitchen EPIPEN 2-PAK 0.3 MG/0.3ML SOAJ injection Inject 0.3 mg into the muscle as needed (anaphylaxis).   0  . metoprolol tartrate (LOPRESSOR) 25 MG tablet Take 1 tablet (25 mg total) by mouth 2 (two) times daily. 60 tablet 9  . Multiple Vitamin (MULTIVITAMIN WITH MINERALS) TABS tablet Take 1 tablet by mouth daily.    . Multiple Vitamins-Minerals (PRESERVISION AREDS PO) Take 1 capsule by mouth 2 (two) times daily. Eye vitamins     No current facility-administered medications for this visit.    Allergies:   Condrolite; Glucosamine; Glucosamine forte; and Glucosamine sulfate-msm    Social History:  The patient  reports that she has never smoked. She has never used smokeless tobacco. She reports that she does not drink alcohol or use illicit drugs.   Family History:  The patient's family history includes Cancer in her maternal grandmother; Dementia in her paternal grandmother; Diabetes in her brother; Heart Problems in her father; Kidney failure in her brother; Osteoporosis in her mother; Stroke in her father.  There is no history of Heart attack.    ROS:  Please see the history of present illness.   Otherwise, review of systems are positive for back pain-imprvoing.   All other systems are reviewed and negative.    PHYSICAL EXAM: VS:  BP 110/60 mmHg  Pulse 51  Ht 5' 3.5" (1.613 m)  Wt 205 lb 12.8 oz (93.35 kg)  BMI 35.88 kg/m2 , BMI Body mass index is 35.88 kg/(m^2). GEN: Well nourished, well developed, in no acute distress HEENT: normal Neck: no JVD, carotid bruits, or masses Cardiac: bradycardic,; no murmurs, rubs, or gallops,no edema  Respiratory:  clear to auscultation bilaterally, normal work of breathing GI: soft, nontender, nondistended, + BS MS: no deformity or atrophy; bruised left wrist Skin: warm and dry, no  rash Neuro:  Strength and sensation are intact Psych: euthymic mood, full affect   EKG:   The ekg ordered today demonstrates sinus bradycardia, anterior T wave changes, unchanged from prior   Recent Labs: No results found for requested labs within last 365 days.   Lipid Panel No results found for: CHOL, TRIG, HDL, CHOLHDL, VLDL, LDLCALC, LDLDIRECT   Other studies Reviewed: Additional studies/ records that were reviewed today with results demonstrating: LV function normal EF 55-60%..   ASSESSMENT AND PLAN:  1. AFib: Maintaining sinus rhythm. Eliquis for stroke prevention. Symptoms are well controlled. No bleeding problems.  Continue regular exercise. If she does have atrial fibrillation that sustains, she can take an extra half tablet of metoprolol. She is bradycardic at rest. No symptoms of bradycardia. No indication for pacemaker at this time.  If she takes her metoprolol every 12 hours, she has less AFib.  2. Macular degeneration: She prefers Eliquis to Coumadin because it allows her to eat leafy green vegetables which are required for her macular degeneration therapy.  She will need cataract surgery coming up.  She had a good result with left eye injections.  3. OSA: She follows up with Dr. Mayford Knife.  She uses her CPAP regularly.    Current medicines are reviewed at length with the patient today.  The patient concerns regarding her medicines were addressed.  The following changes have been made:  No change  Labs/ tests ordered today include:   Orders Placed This Encounter  Procedures  . EKG 12-Lead    Recommend 150 minutes/week of aerobic exercise Low fat, low carb, high fiber diet recommended  Disposition:   FU in 12 months   Signed, [name], MD  11/09/2015 9:03 AM    [facility] [address] [place], [place], [place]  [address] Phone: [phone]; Fax: [phone]

## 2015-11-09 ENCOUNTER — Encounter: Payer: Self-pay | Admitting: Interventional Cardiology

## 2015-11-09 ENCOUNTER — Ambulatory Visit (INDEPENDENT_AMBULATORY_CARE_PROVIDER_SITE_OTHER): Payer: Medicare Other | Admitting: Interventional Cardiology

## 2015-11-09 VITALS — BP 110/60 | HR 51 | Ht 63.5 in | Wt 205.8 lb

## 2015-11-09 DIAGNOSIS — R001 Bradycardia, unspecified: Secondary | ICD-10-CM

## 2015-11-09 DIAGNOSIS — I4891 Unspecified atrial fibrillation: Secondary | ICD-10-CM

## 2015-11-09 DIAGNOSIS — H3530 Unspecified macular degeneration: Secondary | ICD-10-CM

## 2015-11-09 NOTE — Patient Instructions (Signed)
**Note De-identified  Obfuscation** Medication Instructions:  Same-no changes  Labwork: None  Testing/Procedures: None  Follow-Up: Your physician wants you to follow-up in: 1 year. You will receive a reminder letter in the mail two months in advance. If you don't receive a letter, please call our office to schedule the follow-up appointment.      If you need a refill on your cardiac medications before your next appointment, please call your pharmacy.   

## 2015-11-21 ENCOUNTER — Ambulatory Visit (INDEPENDENT_AMBULATORY_CARE_PROVIDER_SITE_OTHER): Payer: Medicare Other | Admitting: Licensed Clinical Social Worker

## 2015-11-21 DIAGNOSIS — F419 Anxiety disorder, unspecified: Secondary | ICD-10-CM | POA: Diagnosis not present

## 2015-12-05 ENCOUNTER — Ambulatory Visit (INDEPENDENT_AMBULATORY_CARE_PROVIDER_SITE_OTHER): Payer: Medicare Other | Admitting: Licensed Clinical Social Worker

## 2015-12-05 DIAGNOSIS — F419 Anxiety disorder, unspecified: Secondary | ICD-10-CM | POA: Diagnosis not present

## 2015-12-19 ENCOUNTER — Ambulatory Visit (INDEPENDENT_AMBULATORY_CARE_PROVIDER_SITE_OTHER): Payer: Medicare Other | Admitting: Licensed Clinical Social Worker

## 2015-12-19 DIAGNOSIS — F419 Anxiety disorder, unspecified: Secondary | ICD-10-CM

## 2016-01-01 ENCOUNTER — Ambulatory Visit (INDEPENDENT_AMBULATORY_CARE_PROVIDER_SITE_OTHER): Payer: Medicare Other | Admitting: Licensed Clinical Social Worker

## 2016-01-01 DIAGNOSIS — F419 Anxiety disorder, unspecified: Secondary | ICD-10-CM

## 2016-01-11 ENCOUNTER — Other Ambulatory Visit: Payer: Self-pay | Admitting: Interventional Cardiology

## 2016-01-16 ENCOUNTER — Ambulatory Visit (INDEPENDENT_AMBULATORY_CARE_PROVIDER_SITE_OTHER): Payer: Medicare Other | Admitting: Licensed Clinical Social Worker

## 2016-01-16 DIAGNOSIS — F419 Anxiety disorder, unspecified: Secondary | ICD-10-CM | POA: Diagnosis not present

## 2016-01-30 ENCOUNTER — Ambulatory Visit (INDEPENDENT_AMBULATORY_CARE_PROVIDER_SITE_OTHER): Payer: Medicare Other | Admitting: Licensed Clinical Social Worker

## 2016-01-30 DIAGNOSIS — F419 Anxiety disorder, unspecified: Secondary | ICD-10-CM

## 2016-02-07 ENCOUNTER — Other Ambulatory Visit: Payer: Self-pay | Admitting: Interventional Cardiology

## 2016-02-27 ENCOUNTER — Ambulatory Visit (INDEPENDENT_AMBULATORY_CARE_PROVIDER_SITE_OTHER): Payer: Medicare Other | Admitting: Licensed Clinical Social Worker

## 2016-02-27 DIAGNOSIS — F419 Anxiety disorder, unspecified: Secondary | ICD-10-CM | POA: Diagnosis not present

## 2016-03-12 ENCOUNTER — Ambulatory Visit (INDEPENDENT_AMBULATORY_CARE_PROVIDER_SITE_OTHER): Payer: Medicare Other | Admitting: Licensed Clinical Social Worker

## 2016-03-12 DIAGNOSIS — F419 Anxiety disorder, unspecified: Secondary | ICD-10-CM

## 2016-03-26 ENCOUNTER — Ambulatory Visit (INDEPENDENT_AMBULATORY_CARE_PROVIDER_SITE_OTHER): Payer: Medicare Other | Admitting: Licensed Clinical Social Worker

## 2016-03-26 DIAGNOSIS — F419 Anxiety disorder, unspecified: Secondary | ICD-10-CM | POA: Diagnosis not present

## 2016-04-09 ENCOUNTER — Ambulatory Visit (INDEPENDENT_AMBULATORY_CARE_PROVIDER_SITE_OTHER): Payer: Medicare Other | Admitting: Licensed Clinical Social Worker

## 2016-04-09 DIAGNOSIS — F419 Anxiety disorder, unspecified: Secondary | ICD-10-CM

## 2016-04-23 ENCOUNTER — Ambulatory Visit (INDEPENDENT_AMBULATORY_CARE_PROVIDER_SITE_OTHER): Payer: Medicare Other | Admitting: Licensed Clinical Social Worker

## 2016-04-23 DIAGNOSIS — F419 Anxiety disorder, unspecified: Secondary | ICD-10-CM | POA: Diagnosis not present

## 2016-05-07 ENCOUNTER — Ambulatory Visit (INDEPENDENT_AMBULATORY_CARE_PROVIDER_SITE_OTHER): Payer: Medicare Other | Admitting: Licensed Clinical Social Worker

## 2016-05-07 DIAGNOSIS — F410 Panic disorder [episodic paroxysmal anxiety] without agoraphobia: Secondary | ICD-10-CM | POA: Diagnosis not present

## 2016-05-21 ENCOUNTER — Ambulatory Visit: Payer: Medicare Other | Admitting: Licensed Clinical Social Worker

## 2016-05-21 ENCOUNTER — Telehealth: Payer: Self-pay | Admitting: Interventional Cardiology

## 2016-05-21 NOTE — Telephone Encounter (Signed)
Spoke with patient who states she is hurting under left shoulder blade intermittently since last night, worse with movement.  States the pain gets worse when she starts doing things with her left arm.  Describes the pain as "sharp" and "cramping" and "intermittent." States it feels better when she lays on the heating pad. Denies SOB. States this is different than the back pain she had when she saw Dr. Eldridge Dace in June.  States she is exercising more and thinks this may be related. I advised her to contact her PCP.  She verbalized understanding and agreement with plan and thanked me for my time.

## 2016-05-21 NOTE — Telephone Encounter (Signed)
New message      Pt c/o left upper back pain since last night. It is the area where her bra is located. Pt is concerned that it may be her heart.  Please call

## 2016-06-04 ENCOUNTER — Ambulatory Visit (INDEPENDENT_AMBULATORY_CARE_PROVIDER_SITE_OTHER): Payer: Medicare Other | Admitting: Licensed Clinical Social Worker

## 2016-06-04 DIAGNOSIS — F419 Anxiety disorder, unspecified: Secondary | ICD-10-CM | POA: Diagnosis not present

## 2016-06-18 ENCOUNTER — Ambulatory Visit (INDEPENDENT_AMBULATORY_CARE_PROVIDER_SITE_OTHER): Payer: Medicare Other | Admitting: Licensed Clinical Social Worker

## 2016-06-18 DIAGNOSIS — F419 Anxiety disorder, unspecified: Secondary | ICD-10-CM | POA: Diagnosis not present

## 2016-07-02 ENCOUNTER — Ambulatory Visit (INDEPENDENT_AMBULATORY_CARE_PROVIDER_SITE_OTHER): Payer: Medicare Other | Admitting: Licensed Clinical Social Worker

## 2016-07-02 DIAGNOSIS — F419 Anxiety disorder, unspecified: Secondary | ICD-10-CM

## 2016-07-16 ENCOUNTER — Ambulatory Visit (INDEPENDENT_AMBULATORY_CARE_PROVIDER_SITE_OTHER): Payer: Medicare Other | Admitting: Licensed Clinical Social Worker

## 2016-07-16 DIAGNOSIS — F419 Anxiety disorder, unspecified: Secondary | ICD-10-CM

## 2016-07-30 ENCOUNTER — Ambulatory Visit (INDEPENDENT_AMBULATORY_CARE_PROVIDER_SITE_OTHER): Payer: Medicare Other | Admitting: Licensed Clinical Social Worker

## 2016-07-30 DIAGNOSIS — F419 Anxiety disorder, unspecified: Secondary | ICD-10-CM | POA: Diagnosis not present

## 2016-08-13 ENCOUNTER — Ambulatory Visit (INDEPENDENT_AMBULATORY_CARE_PROVIDER_SITE_OTHER): Payer: Medicare Other | Admitting: Licensed Clinical Social Worker

## 2016-08-13 DIAGNOSIS — F419 Anxiety disorder, unspecified: Secondary | ICD-10-CM

## 2016-08-27 ENCOUNTER — Ambulatory Visit (INDEPENDENT_AMBULATORY_CARE_PROVIDER_SITE_OTHER): Payer: Medicare Other | Admitting: Licensed Clinical Social Worker

## 2016-08-27 DIAGNOSIS — F419 Anxiety disorder, unspecified: Secondary | ICD-10-CM | POA: Diagnosis not present

## 2016-09-10 ENCOUNTER — Ambulatory Visit (INDEPENDENT_AMBULATORY_CARE_PROVIDER_SITE_OTHER): Payer: Medicare Other | Admitting: Licensed Clinical Social Worker

## 2016-09-10 DIAGNOSIS — F419 Anxiety disorder, unspecified: Secondary | ICD-10-CM

## 2016-09-24 ENCOUNTER — Ambulatory Visit (INDEPENDENT_AMBULATORY_CARE_PROVIDER_SITE_OTHER): Payer: Medicare Other | Admitting: Licensed Clinical Social Worker

## 2016-09-24 DIAGNOSIS — F419 Anxiety disorder, unspecified: Secondary | ICD-10-CM

## 2016-09-27 ENCOUNTER — Other Ambulatory Visit: Payer: Self-pay | Admitting: Interventional Cardiology

## 2016-10-22 ENCOUNTER — Ambulatory Visit (INDEPENDENT_AMBULATORY_CARE_PROVIDER_SITE_OTHER): Payer: Medicare Other | Admitting: Licensed Clinical Social Worker

## 2016-10-22 DIAGNOSIS — F419 Anxiety disorder, unspecified: Secondary | ICD-10-CM | POA: Diagnosis not present

## 2016-11-05 ENCOUNTER — Ambulatory Visit (INDEPENDENT_AMBULATORY_CARE_PROVIDER_SITE_OTHER): Payer: Medicare Other | Admitting: Licensed Clinical Social Worker

## 2016-11-05 DIAGNOSIS — F419 Anxiety disorder, unspecified: Secondary | ICD-10-CM | POA: Diagnosis not present

## 2016-11-18 ENCOUNTER — Ambulatory Visit (INDEPENDENT_AMBULATORY_CARE_PROVIDER_SITE_OTHER): Payer: Medicare Other | Admitting: Interventional Cardiology

## 2016-11-18 ENCOUNTER — Encounter: Payer: Self-pay | Admitting: Interventional Cardiology

## 2016-11-18 VITALS — BP 120/78 | HR 84 | Ht 63.5 in | Wt 197.0 lb

## 2016-11-18 DIAGNOSIS — Z7901 Long term (current) use of anticoagulants: Secondary | ICD-10-CM | POA: Diagnosis not present

## 2016-11-18 DIAGNOSIS — I480 Paroxysmal atrial fibrillation: Secondary | ICD-10-CM | POA: Diagnosis not present

## 2016-11-18 NOTE — Patient Instructions (Signed)
Medication Instructions:  Your physician recommends that you continue on your current medications as directed. Please refer to the Current Medication list given to you today.   Labwork: None ordered  Testing/Procedures: None ordered  Follow-Up: Your physician recommends that you schedule a follow-up appointment with Dr. Mayford Knife for Sleep Apnea (NEXT AVAILABLE)   Your physician wants you to follow-up with Dr. Eldridge Dace 6 months after your appointment with Dr. Mayford Knife. You will receive a reminder letter in the mail two months in advance. If you don't receive a letter, please call our office to schedule the follow-up appointment.   Any Other Special Instructions Will Be Listed Below (If Applicable).     If you need a refill on your cardiac medications before your next appointment, please call your pharmacy.

## 2016-11-18 NOTE — Progress Notes (Signed)
Cardiology Office Note   Date:  11/18/2016   ID:  Rebecca Short March 24, 1941, MRN [id number]  PCP:  Rebecca Gosselin, MD    No chief complaint on file. PAF   Wt Readings from Last 3 Encounters:  11/18/16 197 lb (89.4 kg)  11/09/15 205 lb 12.8 oz (93.4 kg)  01/31/15 200 lb 12.8 oz (91.1 kg)       History of Present Illness: Rebecca Short is a 76 y.o. female who has a h/o PAF.  She has chronic back pain which has limited her exercise.  In the past, Managing hydration and stress have helped minimize her palpitations.    She has been treated for sleep apnea with CPAP.  Denies : Chest pain. Dizziness. Leg edema. Nitroglycerin use. Orthopnea.  Paroxysmal nocturnal dyspnea. Shortness of breath. Syncope.   Rare palpitations. She has not had to use extra metoprolol.    Episodes of lightheadedness have decreased in frequency.  She stays hydrated as well. She is good about taking meds on time.  Still uses CPAP.  She does some weights as well.  She is going to the gym when her back is not bothering her, typically 4-5 x/week.     Past Medical History:  Diagnosis Date  . A-fib (HCC)   . Allergy   . Macular degeneration   . OSA (obstructive sleep apnea)    mild with total AHI 7.25/hr and severe during REM sleep at 35/hr now on CPAP at 6cm H2O    Past Surgical History:  Procedure Laterality Date  . REPLACEMENT TOTAL KNEE BILATERAL       Current Outpatient Prescriptions  Medication Sig Dispense Refill  . calcium citrate-vitamin D (CITRACAL+D) 315-200 MG-UNIT per tablet Take 2 tablets by mouth daily.    Marland Kitchen ELIQUIS 5 MG TABS tablet TAKE 1 TABLET (5 MG TOTAL) BY MOUTH 2 (TWO) TIMES DAILY. 60 tablet 8  . EPIPEN 2-PAK 0.3 MG/0.3ML SOAJ injection Inject 0.3 mg into the muscle as needed (anaphylaxis).   0  . metoprolol tartrate (LOPRESSOR) 25 MG tablet TAKE 1 TABLET (25 MG TOTAL) BY MOUTH 2 (TWO) TIMES DAILY. 60 tablet 1  . Multiple Vitamin (MULTIVITAMIN WITH MINERALS) TABS tablet  Take 1 tablet by mouth daily.    . Multiple Vitamins-Minerals (PRESERVISION AREDS PO) Take 1 capsule by mouth 2 (two) times daily. Eye vitamins     No current facility-administered medications for this visit.     Allergies:   Condrolite [glucosamine-chondroitin-msm]; Glucosamine; Glucosamine forte [nutritional supplements]; and Glucosamine sulfate-msm    Social History:  The patient  reports that she has never smoked. She has never used smokeless tobacco. She reports that she does not drink alcohol or use drugs.   Family History:  The patient's family history includes Cancer in her maternal grandmother; Dementia in her paternal grandmother; Diabetes in her brother; Heart Problems in her father; Kidney failure in her brother; Osteoporosis in her mother; Stroke in her father.    ROS:  Please see the history of present illness.   Otherwise, review of systems are positive for occasional palpitations.   All other systems are reviewed and negative.    PHYSICAL EXAM: VS:  BP 120/78   Pulse 84   Ht 5' 3.5" (1.613 m)   Wt 197 lb (89.4 kg)   SpO2 97%   BMI 34.35 kg/m  , BMI Body mass index is 34.35 kg/m. GEN: Well nourished, well developed, in no acute distress  HEENT: normal  Neck: no JVD, carotid bruits, or masses Cardiac: irregularly irregular; no murmurs, rubs, or gallops,no edema  Respiratory:  clear to auscultation bilaterally, normal work of breathing GI: soft, nontender, nondistended, + BS MS: no deformity or atrophy  Skin: warm and dry, no rash Neuro:  Strength and sensation are intact Psych: euthymic mood, full affect   EKG:   The ekg ordered today demonstrates AFib, controlled rate   Recent Labs: No results found for requested labs within last 8760 hours.   Lipid Panel No results found for: CHOL, TRIG, HDL, CHOLHDL, VLDL, LDLCALC, LDLDIRECT   Other studies Reviewed: Additional studies/ records that were reviewed today with results demonstrating: .   ASSESSMENT AND  PLAN:  1. Paroxysmal atrial fibrillation: She can continue to take an extra half tablet of metoprolol if her atrial fibrillation symptoms sustained. Watch for symptoms of bradycardia.  Even though she is currently in atrial fibrillation, she is not limited by any symptoms. Will continue current medical therapy. Given her lack of symptoms with the atrial fibrillation, would not pursue antiarrhythmic therapy. 2. Anticoagulated: She has been on Eliquis which she prefers since it allows consumption of leafy green vegetables, which are beneficial for her treatment of macular degeneration. 3. Obstructive sleep apnea: She needs f/u with Dr. Mayford Short.  We'll schedule a follow-up with her 6 months after she sees Dr. Mayford Short.   Current medicines are reviewed at length with the patient today.  The patient concerns regarding her medicines were addressed.  The following changes have been made:  No change  Labs/ tests ordered today include:  No orders of the defined types were placed in this encounter.   Recommend 150 minutes/week of aerobic exercise Low fat, low carb, high fiber diet recommended  Disposition:   FU 6 months post appt with Dr. Mayford Short   Signed, Rebecca Muss, MD  11/18/2016 10:20 AM    [facility] [address] [place], [place], [place]  [address] Phone: [phone]; Fax: [phone]

## 2016-11-19 ENCOUNTER — Ambulatory Visit (INDEPENDENT_AMBULATORY_CARE_PROVIDER_SITE_OTHER): Payer: Medicare Other | Admitting: Licensed Clinical Social Worker

## 2016-11-19 DIAGNOSIS — F419 Anxiety disorder, unspecified: Secondary | ICD-10-CM

## 2016-11-24 ENCOUNTER — Other Ambulatory Visit: Payer: Self-pay | Admitting: Interventional Cardiology

## 2016-12-03 ENCOUNTER — Ambulatory Visit (INDEPENDENT_AMBULATORY_CARE_PROVIDER_SITE_OTHER): Payer: Medicare Other | Admitting: Licensed Clinical Social Worker

## 2016-12-03 DIAGNOSIS — F419 Anxiety disorder, unspecified: Secondary | ICD-10-CM | POA: Diagnosis not present

## 2016-12-16 ENCOUNTER — Other Ambulatory Visit: Payer: Self-pay | Admitting: Interventional Cardiology

## 2016-12-16 NOTE — Telephone Encounter (Signed)
Labwork received from Dr Fredirick Maudlin office Creat on 09/11/16 0.79, based on specified criteria pt is on appropriate dosage of Eliquis 5mg  BID. Will refill rx.

## 2016-12-16 NOTE — Telephone Encounter (Signed)
Pt last saw Dr Eldridge Dace 11/18/16, last labs in chart 07/24/14 Creat 0.79.  Pt is overdue for labwork will call primary MD to request labs.  Age 76, weight 89.4kg  Called Dr Fredirick Maudlin office, labwork done in April 2018, they will fax copy of results to office.  Will await results and then address Eliquis refill.

## 2016-12-17 ENCOUNTER — Ambulatory Visit (INDEPENDENT_AMBULATORY_CARE_PROVIDER_SITE_OTHER): Payer: Medicare Other | Admitting: Licensed Clinical Social Worker

## 2016-12-17 DIAGNOSIS — F419 Anxiety disorder, unspecified: Secondary | ICD-10-CM | POA: Diagnosis not present

## 2016-12-31 ENCOUNTER — Ambulatory Visit: Payer: Medicare Other | Admitting: Licensed Clinical Social Worker

## 2017-01-14 ENCOUNTER — Ambulatory Visit (INDEPENDENT_AMBULATORY_CARE_PROVIDER_SITE_OTHER): Payer: Medicare Other | Admitting: Licensed Clinical Social Worker

## 2017-01-14 DIAGNOSIS — F419 Anxiety disorder, unspecified: Secondary | ICD-10-CM | POA: Diagnosis not present

## 2017-01-28 ENCOUNTER — Ambulatory Visit (INDEPENDENT_AMBULATORY_CARE_PROVIDER_SITE_OTHER): Payer: Medicare Other | Admitting: Licensed Clinical Social Worker

## 2017-01-28 DIAGNOSIS — F419 Anxiety disorder, unspecified: Secondary | ICD-10-CM | POA: Diagnosis not present

## 2017-02-06 ENCOUNTER — Encounter: Payer: Self-pay | Admitting: Cardiology

## 2017-02-11 ENCOUNTER — Ambulatory Visit (INDEPENDENT_AMBULATORY_CARE_PROVIDER_SITE_OTHER): Payer: Medicare Other | Admitting: Licensed Clinical Social Worker

## 2017-02-11 DIAGNOSIS — F419 Anxiety disorder, unspecified: Secondary | ICD-10-CM | POA: Diagnosis not present

## 2017-02-19 ENCOUNTER — Ambulatory Visit (INDEPENDENT_AMBULATORY_CARE_PROVIDER_SITE_OTHER): Payer: PRIVATE HEALTH INSURANCE | Admitting: Cardiology

## 2017-02-19 ENCOUNTER — Encounter: Payer: Self-pay | Admitting: Cardiology

## 2017-02-19 VITALS — BP 122/78 | HR 59 | Ht 62.0 in | Wt 197.8 lb

## 2017-02-19 DIAGNOSIS — G4733 Obstructive sleep apnea (adult) (pediatric): Secondary | ICD-10-CM | POA: Diagnosis not present

## 2017-02-19 DIAGNOSIS — E669 Obesity, unspecified: Secondary | ICD-10-CM

## 2017-02-19 NOTE — Patient Instructions (Signed)
Medication Instructions:  The current medical regimen is effective;  continue present plan and medications.  Follow-Up: Follow up in 1 year with Dr. Turner.  You will receive a letter in the mail 2 months before you are due.  Please call us when you receive this letter to schedule your follow up appointment.  If you need a refill on your cardiac medications before your next appointment, please call your pharmacy.  Thank you for choosing [facility]!!     

## 2017-02-19 NOTE — Progress Notes (Signed)
Cardiology Office Note:    Date:  02/19/2017   ID:  Rebecca Short, Rebecca Short Jul 18, 1940, MRN [id number]  PCP:  Catha Gosselin, MD  Cardiologist:  Armanda Magic, MD   Referring MD: Catha Gosselin, MD   Chief Complaint  Patient presents with  . Sleep Apnea    History of Present Illness:    Rebecca Short is a 76 y.o. 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  She is doing well with her CPAP device and thinks that she has gotten used to it.  She tolerates the nasal pillow mask and feels the pressure is adequate.  Since going on CPAP she feels rested in the am and has no significant daytime sleepiness.  She denies any significant mouth or nasal dryness or nasal congestion.  She does not think that he snores.     Past Medical History:  Diagnosis Date  . A-fib (HCC)   . Allergy   . Macular degeneration   . OSA (obstructive sleep apnea)    mild with total AHI 7.25/hr and severe during REM sleep at 35/hr now on CPAP at 6cm H2O    Past Surgical History:  Procedure Laterality Date  . REPLACEMENT TOTAL KNEE BILATERAL      Current Medications: Current Meds  Medication Sig  . calcium citrate-vitamin D (CITRACAL+D) 315-200 MG-UNIT per tablet Take 2 tablets by mouth daily.  Marland Kitchen ELIQUIS 5 MG TABS tablet TAKE 1 TABLET (5 MG TOTAL) BY MOUTH 2 (TWO) TIMES DAILY.  Marland Kitchen EPIPEN 2-PAK 0.3 MG/0.3ML SOAJ injection Inject 0.3 mg into the muscle as needed (anaphylaxis).   . metoprolol tartrate (LOPRESSOR) 25 MG tablet TAKE 1 TABLET (25 MG TOTAL) BY MOUTH 2 (TWO) TIMES DAILY.  . Multiple Vitamin (MULTIVITAMIN WITH MINERALS) TABS tablet Take 1 tablet by mouth daily.  . Multiple Vitamins-Minerals (PRESERVISION AREDS PO) Take 1 capsule by mouth 2 (two) times daily. Eye vitamins     Allergies:   Condrolite [glucosamine-chondroitin-msm]; Glucosamine; Glucosamine forte [nutritional supplements]; and Glucosamine sulfate-msm   Social  History   Social History  . Marital status: Married    Spouse name: N/A  . Number of children: N/A  . Years of education: N/A   Social History Main Topics  . Smoking status: Never Smoker  . Smokeless tobacco: Never Used  . Alcohol use No  . Drug use: No  . Sexual activity: Not Asked   Other Topics Concern  . None   Social History Narrative  . None     Family History: The patient's family history includes Cancer in her maternal grandmother; Dementia in her paternal grandmother; Diabetes in her brother; Heart Problems in her father; Kidney failure in her brother; Osteoporosis in her mother; Stroke in her father; Suicidality in her unknown relative. There is no history of Heart attack.  ROS:   Please see the history of present illness.    ROS  All other systems reviewed and negative.   EKGs/Labs/Other Studies Reviewed:    The following studies were reviewed today: CPAP download  EKG:  EKG is not ordered today.   Recent Labs: No results found for requested labs within last 8760 hours.   Recent Lipid Panel No results found for: CHOL, TRIG, HDL, CHOLHDL, VLDL, LDLCALC, LDLDIRECT  Physical Exam:    VS:  BP 122/78   Pulse (!) 59   Ht 5\' 2"  (  1.575 m)   Wt 197 lb 12.8 oz (89.7 kg)   SpO2 96%   BMI 36.18 kg/m     Wt Readings from Last 3 Encounters:  02/19/17 197 lb 12.8 oz (89.7 kg)  11/18/16 197 lb (89.4 kg)  11/09/15 205 lb 12.8 oz (93.4 kg)     GEN:  Well nourished, well developed in no acute distress HEENT: Normal NECK: No JVD; No carotid bruits LYMPHATICS: No lymphadenopathy CARDIAC: RRR, no murmurs, rubs, gallops RESPIRATORY:  Clear to auscultation without rales, wheezing or rhonchi  ABDOMEN: Soft, non-tender, non-distended MUSCULOSKELETAL:  No edema; No deformity  SKIN: Warm and dry NEUROLOGIC:  Alert and oriented x 3 PSYCHIATRIC:  Normal affect   ASSESSMENT:    1. OSA (obstructive sleep apnea)   2. Obesity (BMI 30-39.9)    PLAN:    In order  of problems listed above:  1.  OSA - the patient is tolerating PAP therapy well without any problems. The PAP download was reviewed today and showed an AHI of 0.9/hr on 6 cm H2O with 100% compliance in using more than 4 hours nightly.  The patient has been using and benefiting from CPAP use and will continue to benefit from therapy.   2.  Obesity - I encouraged her to continue walking on the treadmill.  She continues to eat mainly a vegetarian diet and I encouraged her to watch her carbs and portions.      Medication Adjustments/Labs and Tests Ordered: Current medicines are reviewed at length with the patient today.  Concerns regarding medicines are outlined above.  No orders of the defined types were placed in this encounter.  No orders of the defined types were placed in this encounter.   Signed, Armanda Magic, MD  02/19/2017 2:16 PM     [facility]

## 2017-02-25 ENCOUNTER — Ambulatory Visit (INDEPENDENT_AMBULATORY_CARE_PROVIDER_SITE_OTHER): Payer: Medicare Other | Admitting: Licensed Clinical Social Worker

## 2017-02-25 DIAGNOSIS — F419 Anxiety disorder, unspecified: Secondary | ICD-10-CM

## 2017-03-11 ENCOUNTER — Ambulatory Visit (INDEPENDENT_AMBULATORY_CARE_PROVIDER_SITE_OTHER): Payer: Medicare Other | Admitting: Licensed Clinical Social Worker

## 2017-03-11 DIAGNOSIS — F419 Anxiety disorder, unspecified: Secondary | ICD-10-CM

## 2017-03-25 ENCOUNTER — Ambulatory Visit (INDEPENDENT_AMBULATORY_CARE_PROVIDER_SITE_OTHER): Payer: Medicare Other | Admitting: Licensed Clinical Social Worker

## 2017-03-25 DIAGNOSIS — F419 Anxiety disorder, unspecified: Secondary | ICD-10-CM

## 2017-04-08 ENCOUNTER — Ambulatory Visit: Payer: Medicare Other | Admitting: Licensed Clinical Social Worker

## 2017-04-22 ENCOUNTER — Ambulatory Visit (INDEPENDENT_AMBULATORY_CARE_PROVIDER_SITE_OTHER): Payer: Medicare Other | Admitting: Licensed Clinical Social Worker

## 2017-04-22 DIAGNOSIS — F419 Anxiety disorder, unspecified: Secondary | ICD-10-CM

## 2017-05-06 ENCOUNTER — Ambulatory Visit (INDEPENDENT_AMBULATORY_CARE_PROVIDER_SITE_OTHER): Payer: Medicare Other | Admitting: Licensed Clinical Social Worker

## 2017-05-06 DIAGNOSIS — F419 Anxiety disorder, unspecified: Secondary | ICD-10-CM

## 2017-05-13 ENCOUNTER — Ambulatory Visit: Payer: Self-pay | Admitting: Interventional Cardiology

## 2017-06-03 ENCOUNTER — Ambulatory Visit (INDEPENDENT_AMBULATORY_CARE_PROVIDER_SITE_OTHER): Payer: Medicare Other | Admitting: Licensed Clinical Social Worker

## 2017-06-03 DIAGNOSIS — F419 Anxiety disorder, unspecified: Secondary | ICD-10-CM

## 2017-06-17 ENCOUNTER — Ambulatory Visit (INDEPENDENT_AMBULATORY_CARE_PROVIDER_SITE_OTHER): Payer: Medicare Other | Admitting: Licensed Clinical Social Worker

## 2017-06-17 DIAGNOSIS — F419 Anxiety disorder, unspecified: Secondary | ICD-10-CM | POA: Diagnosis not present

## 2017-07-01 ENCOUNTER — Ambulatory Visit (INDEPENDENT_AMBULATORY_CARE_PROVIDER_SITE_OTHER): Payer: Medicare Other | Admitting: Licensed Clinical Social Worker

## 2017-07-01 DIAGNOSIS — F419 Anxiety disorder, unspecified: Secondary | ICD-10-CM

## 2017-07-15 ENCOUNTER — Ambulatory Visit (INDEPENDENT_AMBULATORY_CARE_PROVIDER_SITE_OTHER): Payer: Medicare Other | Admitting: Licensed Clinical Social Worker

## 2017-07-15 DIAGNOSIS — F419 Anxiety disorder, unspecified: Secondary | ICD-10-CM

## 2017-07-29 ENCOUNTER — Ambulatory Visit (INDEPENDENT_AMBULATORY_CARE_PROVIDER_SITE_OTHER): Payer: Medicare Other | Admitting: Licensed Clinical Social Worker

## 2017-07-29 DIAGNOSIS — F419 Anxiety disorder, unspecified: Secondary | ICD-10-CM

## 2017-08-07 ENCOUNTER — Encounter: Payer: Self-pay | Admitting: Interventional Cardiology

## 2017-08-12 ENCOUNTER — Ambulatory Visit: Payer: Medicare Other | Admitting: Licensed Clinical Social Worker

## 2017-08-18 ENCOUNTER — Encounter: Payer: Self-pay | Admitting: Interventional Cardiology

## 2017-08-18 ENCOUNTER — Ambulatory Visit (INDEPENDENT_AMBULATORY_CARE_PROVIDER_SITE_OTHER): Payer: Medicare Other | Admitting: Licensed Clinical Social Worker

## 2017-08-18 ENCOUNTER — Ambulatory Visit (INDEPENDENT_AMBULATORY_CARE_PROVIDER_SITE_OTHER): Payer: Medicare Other | Admitting: Interventional Cardiology

## 2017-08-18 VITALS — BP 110/70 | HR 98 | Ht 62.0 in | Wt 197.0 lb

## 2017-08-18 DIAGNOSIS — F419 Anxiety disorder, unspecified: Secondary | ICD-10-CM

## 2017-08-18 DIAGNOSIS — Z7901 Long term (current) use of anticoagulants: Secondary | ICD-10-CM | POA: Diagnosis not present

## 2017-08-18 DIAGNOSIS — E785 Hyperlipidemia, unspecified: Secondary | ICD-10-CM | POA: Diagnosis not present

## 2017-08-18 DIAGNOSIS — G4733 Obstructive sleep apnea (adult) (pediatric): Secondary | ICD-10-CM | POA: Diagnosis not present

## 2017-08-18 DIAGNOSIS — I4819 Other persistent atrial fibrillation: Secondary | ICD-10-CM

## 2017-08-18 DIAGNOSIS — I481 Persistent atrial fibrillation: Secondary | ICD-10-CM

## 2017-08-18 NOTE — Progress Notes (Signed)
Cardiology Office Note   Date:  08/18/2017   ID:  Rebecca Short, Rebecca Short 11-12-1940, MRN [id number]  PCP:  Rebecca Gosselin, MD    No chief complaint on file.  PAF  Wt Readings from Last 3 Encounters:  08/18/17 197 lb (89.4 kg)  02/19/17 197 lb 12.8 oz (89.7 kg)  11/18/16 197 lb (89.4 kg)       History of Present Illness: Rebecca Short is a 77 y.o. female  who has a h/o PAF.  She has chronic back pain which has limited her exercise.  In the past, Managing hydration and stress have helped minimize her palpitations.    She has been treated for sleep apnea with CPAP.  Exercise is limited by back pain.  She still does the treadmill for 7-8 minutes at a time.  SHe does weights as well.  No cardiac sx.    Denies : Chest pain. Dizziness. Leg edema. Nitroglycerin use. Orthopnea. Palpitations. Paroxysmal nocturnal dyspnea. Shortness of breath. Syncope.   One episode of lightheadedness that resolved on its own.  Has not had to use extra metoprolol prn.      Past Medical History:  Diagnosis Date  . A-fib (HCC)   . Allergy   . Macular degeneration   . OSA (obstructive sleep apnea)    mild with total AHI 7.25/hr and severe during REM sleep at 35/hr now on CPAP at 6cm H2O    Past Surgical History:  Procedure Laterality Date  . REPLACEMENT TOTAL KNEE BILATERAL       Current Outpatient Medications  Medication Sig Dispense Refill  . calcium citrate-vitamin D (CITRACAL+D) 315-200 MG-UNIT per tablet Take 2 tablets by mouth daily.    Marland Kitchen ELIQUIS 5 MG TABS tablet TAKE 1 TABLET (5 MG TOTAL) BY MOUTH 2 (TWO) TIMES DAILY. 60 tablet 8  . EPIPEN 2-PAK 0.3 MG/0.3ML SOAJ injection Inject 0.3 mg into the muscle as needed (anaphylaxis).   0  . metoprolol tartrate (LOPRESSOR) 25 MG tablet TAKE 1 TABLET (25 MG TOTAL) BY MOUTH 2 (TWO) TIMES DAILY. 180 tablet 3  . Multiple Vitamin (MULTIVITAMIN WITH MINERALS) TABS tablet Take 1 tablet by mouth daily.    . Multiple Vitamins-Minerals (PRESERVISION  AREDS PO) Take 1 capsule by mouth 2 (two) times daily. Eye vitamins     No current facility-administered medications for this visit.     Allergies:   Condrolite [glucosamine-chondroitin-msm]; Glucosamine; Glucosamine chondroitin complx  [cholestatin]; Glucosamine forte [nutritional supplements]; and Glucosamine sulfate-msm    Social History:  The patient  reports that she has never smoked. She has never used smokeless tobacco. She reports that she does not drink alcohol or use drugs.   Family History:  The patient's family history includes Cancer in her maternal grandmother; Dementia in her paternal grandmother; Diabetes in her brother; Heart Problems in her father; Kidney failure in her brother; Osteoporosis in her mother; Stroke in her father; Suicidality in her unknown relative.    ROS:  Please see the history of present illness.   Otherwise, review of systems are positive for rare lightheadedness.   All other systems are reviewed and negative.    PHYSICAL EXAM: VS:  BP 110/70   Pulse 98   Ht 5\' 2"  (1.575 m)   Wt 197 lb (89.4 kg)   SpO2 96%   BMI 36.03 kg/m  , BMI Body mass index is 36.03 kg/m. GEN: Well nourished, well developed, in no acute distress  HEENT: normal  Neck: no JVD,  carotid bruits, or masses Cardiac: irregularly irregular; no murmurs, rubs, or gallops,no edema  Respiratory:  clear to auscultation bilaterally, normal work of breathing GI: soft, nontender, nondistended, + BS MS: no deformity or atrophy  Skin: warm and dry, no rash Neuro:  Strength and sensation are intact Psych: euthymic mood, full affect   EKG:   The ekg ordered in June 2018demonstrates Afib with controlled ventricular response   Recent Labs: No results found for requested labs within last 8760 hours.   Lipid Panel No results found for: CHOL, TRIG, HDL, CHOLHDL, VLDL, LDLCALC, LDLDIRECT   Other studies Reviewed: Additional studies/ records that were reviewed today with results  demonstrating: .   ASSESSMENT AND PLAN:  1. Persistent AFib: Continue extra half tablet of metoprolol if atrial fibrillation symptoms recur and are sustained.  We have declined antiarrhythmic therapy due to her lack of symptoms when she is in atrial fibrillation. 2. Anticoagulated: She has preferred Eliquis since it allows her to eat leafy green vegetables.  This is helpful for her macular degeneration. 3. Obstructive sleep apnea: Follow-up with Rebecca Short.  Treating this should decrease her frequency of atrial fibrillation. 4. I think she is spending more time in atrial fibrillation now.  It will likely be more difficult to get her out of atrial fibrillation.  Given her lack of symptoms, would not pursue antiarrhythmic drug or cardioversion at this time.   Current medicines are reviewed at length with the patient today.  The patient concerns regarding her medicines were addressed.  The following changes have been made:  No change  Labs/ tests ordered today include:  No orders of the defined types were placed in this encounter.   Recommend 150 minutes/week of aerobic exercise Low fat, low carb, high fiber diet recommended  Disposition:   FU in 6 months   Signed, Rebecca Muss, MD  08/18/2017 2:45 PM    [facility] [address] [place], [place], [place]  [address] Phone: [phone]; Fax: [phone]

## 2017-08-18 NOTE — Patient Instructions (Signed)
Medication Instructions:  Your physician recommends that you continue on your current medications as directed. Please refer to the Current Medication list given to you today.   Labwork: Your physician recommends that you return for a FASTING lipid profile, complete metabolic panel, and complete blood count tomorrow 08/19/17   Testing/Procedures: None ordered  Follow-Up: Your physician wants you to follow-up in: 6 months with Dr. Eldridge Dace. You will receive a reminder letter in the mail two months in advance. If you don't receive a letter, please call our office to schedule the follow-up appointment.   Any Other Special Instructions Will Be Listed Below (If Applicable).     If you need a refill on your cardiac medications before your next appointment, please call your pharmacy.

## 2017-08-19 ENCOUNTER — Other Ambulatory Visit: Payer: Self-pay | Admitting: Interventional Cardiology

## 2017-08-19 ENCOUNTER — Other Ambulatory Visit: Payer: Medicare Other | Admitting: *Deleted

## 2017-08-19 DIAGNOSIS — E785 Hyperlipidemia, unspecified: Secondary | ICD-10-CM

## 2017-08-19 DIAGNOSIS — Z7901 Long term (current) use of anticoagulants: Secondary | ICD-10-CM

## 2017-08-19 DIAGNOSIS — G4733 Obstructive sleep apnea (adult) (pediatric): Secondary | ICD-10-CM

## 2017-08-19 DIAGNOSIS — I4819 Other persistent atrial fibrillation: Secondary | ICD-10-CM

## 2017-08-19 LAB — CBC
HEMOGLOBIN: 15.7 g/dL (ref 11.1–15.9)
Hematocrit: 45.6 % (ref 34.0–46.6)
MCH: 30.2 pg (ref 26.6–33.0)
MCHC: 34.4 g/dL (ref 31.5–35.7)
MCV: 88 fL (ref 79–97)
Platelets: 244 10*3/uL (ref 150–379)
RBC: 5.2 x10E6/uL (ref 3.77–5.28)
RDW: 14.2 % (ref 12.3–15.4)
WBC: 6.5 10*3/uL (ref 3.4–10.8)

## 2017-08-19 LAB — COMPREHENSIVE METABOLIC PANEL
A/G RATIO: 1.7 (ref 1.2–2.2)
ALK PHOS: 92 IU/L (ref 39–117)
ALT: 16 IU/L (ref 0–32)
AST: 14 IU/L (ref 0–40)
Albumin: 3.8 g/dL (ref 3.5–4.8)
BILIRUBIN TOTAL: 0.6 mg/dL (ref 0.0–1.2)
BUN/Creatinine Ratio: 21 (ref 12–28)
BUN: 17 mg/dL (ref 8–27)
CHLORIDE: 102 mmol/L (ref 96–106)
CO2: 23 mmol/L (ref 20–29)
Calcium: 9.2 mg/dL (ref 8.7–10.3)
Creatinine, Ser: 0.82 mg/dL (ref 0.57–1.00)
GFR calc Af Amer: 80 mL/min/{1.73_m2} (ref >59)
GFR calc non Af Amer: 69 mL/min/{1.73_m2} (ref >59)
Globulin, Total: 2.3 g/dL (ref 1.5–4.5)
Glucose: 81 mg/dL (ref 65–99)
POTASSIUM: 4.3 mmol/L (ref 3.5–5.2)
Sodium: 138 mmol/L (ref 134–144)
Total Protein: 6.1 g/dL (ref 6.0–8.5)

## 2017-08-19 LAB — LIPID PANEL
CHOLESTEROL TOTAL: 145 mg/dL (ref 100–199)
Chol/HDL Ratio: 3.1 ratio (ref 0.0–4.4)
HDL: 47 mg/dL (ref >39)
LDL CALC: 78 mg/dL (ref 0–99)
TRIGLYCERIDES: 101 mg/dL (ref 0–149)
VLDL CHOLESTEROL CAL: 20 mg/dL (ref 5–40)

## 2017-08-26 ENCOUNTER — Ambulatory Visit (INDEPENDENT_AMBULATORY_CARE_PROVIDER_SITE_OTHER): Payer: Medicare Other | Admitting: Licensed Clinical Social Worker

## 2017-08-26 DIAGNOSIS — F419 Anxiety disorder, unspecified: Secondary | ICD-10-CM | POA: Diagnosis not present

## 2017-09-05 ENCOUNTER — Other Ambulatory Visit: Payer: Self-pay | Admitting: Interventional Cardiology

## 2017-09-07 NOTE — Telephone Encounter (Signed)
Eliquis 5mg  refill request received; pt is 77 yrs old, wt-89.4kg, Crea-0.82 on 08/19/17, last seen by Dr. 08/21/17 on 08/18/17; will send in refill to requested pharmacy.

## 2017-09-09 ENCOUNTER — Ambulatory Visit (INDEPENDENT_AMBULATORY_CARE_PROVIDER_SITE_OTHER): Payer: Medicare Other | Admitting: Licensed Clinical Social Worker

## 2017-09-09 DIAGNOSIS — F419 Anxiety disorder, unspecified: Secondary | ICD-10-CM

## 2017-09-23 ENCOUNTER — Ambulatory Visit (INDEPENDENT_AMBULATORY_CARE_PROVIDER_SITE_OTHER): Payer: Medicare Other | Admitting: Licensed Clinical Social Worker

## 2017-09-23 DIAGNOSIS — F419 Anxiety disorder, unspecified: Secondary | ICD-10-CM

## 2017-10-07 ENCOUNTER — Ambulatory Visit (INDEPENDENT_AMBULATORY_CARE_PROVIDER_SITE_OTHER): Payer: Medicare Other | Admitting: Licensed Clinical Social Worker

## 2017-10-07 DIAGNOSIS — F419 Anxiety disorder, unspecified: Secondary | ICD-10-CM | POA: Diagnosis not present

## 2017-10-21 ENCOUNTER — Ambulatory Visit (INDEPENDENT_AMBULATORY_CARE_PROVIDER_SITE_OTHER): Payer: Medicare Other | Admitting: Licensed Clinical Social Worker

## 2017-10-21 DIAGNOSIS — F419 Anxiety disorder, unspecified: Secondary | ICD-10-CM

## 2017-11-04 ENCOUNTER — Ambulatory Visit (INDEPENDENT_AMBULATORY_CARE_PROVIDER_SITE_OTHER): Payer: Medicare Other | Admitting: Licensed Clinical Social Worker

## 2017-11-04 DIAGNOSIS — F419 Anxiety disorder, unspecified: Secondary | ICD-10-CM

## 2017-11-11 ENCOUNTER — Other Ambulatory Visit: Payer: Self-pay | Admitting: Interventional Cardiology

## 2017-11-18 ENCOUNTER — Ambulatory Visit (INDEPENDENT_AMBULATORY_CARE_PROVIDER_SITE_OTHER): Payer: Medicare Other | Admitting: Licensed Clinical Social Worker

## 2017-11-18 DIAGNOSIS — F419 Anxiety disorder, unspecified: Secondary | ICD-10-CM | POA: Diagnosis not present

## 2017-12-02 ENCOUNTER — Ambulatory Visit (INDEPENDENT_AMBULATORY_CARE_PROVIDER_SITE_OTHER): Payer: Medicare Other | Admitting: Licensed Clinical Social Worker

## 2017-12-02 DIAGNOSIS — F419 Anxiety disorder, unspecified: Secondary | ICD-10-CM | POA: Diagnosis not present

## 2017-12-16 ENCOUNTER — Ambulatory Visit: Payer: Medicare Other | Admitting: Licensed Clinical Social Worker

## 2017-12-30 ENCOUNTER — Ambulatory Visit (INDEPENDENT_AMBULATORY_CARE_PROVIDER_SITE_OTHER): Payer: Medicare Other | Admitting: Licensed Clinical Social Worker

## 2017-12-30 DIAGNOSIS — F419 Anxiety disorder, unspecified: Secondary | ICD-10-CM | POA: Diagnosis not present

## 2018-01-13 ENCOUNTER — Ambulatory Visit (INDEPENDENT_AMBULATORY_CARE_PROVIDER_SITE_OTHER): Payer: Medicare Other | Admitting: Licensed Clinical Social Worker

## 2018-01-13 DIAGNOSIS — F419 Anxiety disorder, unspecified: Secondary | ICD-10-CM | POA: Diagnosis not present

## 2018-01-27 ENCOUNTER — Ambulatory Visit (INDEPENDENT_AMBULATORY_CARE_PROVIDER_SITE_OTHER): Payer: Medicare Other | Admitting: Licensed Clinical Social Worker

## 2018-01-27 DIAGNOSIS — F419 Anxiety disorder, unspecified: Secondary | ICD-10-CM | POA: Diagnosis not present

## 2018-02-10 ENCOUNTER — Ambulatory Visit (INDEPENDENT_AMBULATORY_CARE_PROVIDER_SITE_OTHER): Payer: Medicare Other | Admitting: Licensed Clinical Social Worker

## 2018-02-10 DIAGNOSIS — F419 Anxiety disorder, unspecified: Secondary | ICD-10-CM | POA: Diagnosis not present

## 2018-02-23 ENCOUNTER — Encounter: Payer: Self-pay | Admitting: Cardiology

## 2018-02-23 ENCOUNTER — Ambulatory Visit (INDEPENDENT_AMBULATORY_CARE_PROVIDER_SITE_OTHER): Payer: Medicare Other | Admitting: Cardiology

## 2018-02-23 VITALS — BP 124/78 | HR 84 | Ht 62.0 in | Wt 201.4 lb

## 2018-02-23 DIAGNOSIS — G4733 Obstructive sleep apnea (adult) (pediatric): Secondary | ICD-10-CM | POA: Diagnosis not present

## 2018-02-23 NOTE — Patient Instructions (Signed)
Medication Instructions:  Your physician recommends that you continue on your current medications as directed. Please refer to the Current Medication list given to you today.  Follow-Up: Your physician wants you to follow-up in: 1 year with Dr. Turner. You will receive a reminder letter in the mail two months in advance. If you don't receive a letter, please call our office to schedule the follow-up appointment.   If you need a refill on your cardiac medications before your next appointment, please call your pharmacy.   

## 2018-02-23 NOTE — Progress Notes (Signed)
Cardiology Office Note:    Date:  02/23/2018   ID:  Rebecca, Short [birth year], MRN [id number]  PCP:  Catha Gosselin, MD  Cardiologist:  No primary care provider on file.    Referring MD: Catha Gosselin, MD   Chief Complaint  Patient presents with  . Sleep Apnea    History of Present Illness:    Rebecca Short is a 77 y.o. 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  She is doing well with her CPAP device.  She tolerates the mask and feels the pressure is adequate.  Since going on CPAP she feels rested in the am and has no significant daytime sleepiness.  She denies any significant mouth or nasal dryness or nasal congestion.  She does not think that he snores.     Past Medical History:  Diagnosis Date  . A-fib (HCC)   . Allergy   . Macular degeneration   . OSA (obstructive sleep apnea)    mild with total AHI 7.25/hr and severe during REM sleep at 35/hr now on CPAP at 6cm H2O    Past Surgical History:  Procedure Laterality Date  . REPLACEMENT TOTAL KNEE BILATERAL      Current Medications: Current Meds  Medication Sig  . calcium citrate-vitamin D (CITRACAL+D) 315-200 MG-UNIT per tablet Take 2 tablets by mouth daily.  Marland Kitchen ELIQUIS 5 MG TABS tablet TAKE 1 TABLET (5 MG TOTAL) BY MOUTH 2 (TWO) TIMES DAILY.  Marland Kitchen EPIPEN 2-PAK 0.3 MG/0.3ML SOAJ injection Inject 0.3 mg into the muscle as needed (anaphylaxis).   . metoprolol tartrate (LOPRESSOR) 25 MG tablet TAKE 1 TABLET BY MOUTH TWICE A DAY  . Multiple Vitamin (MULTIVITAMIN WITH MINERALS) TABS tablet Take 1 tablet by mouth daily.  . Multiple Vitamins-Minerals (PRESERVISION AREDS PO) Take 1 capsule by mouth 2 (two) times daily. Eye vitamins     Allergies:   Condrolite [glucosamine-chondroitin-msm]; Glucosamine; Glucosamine chondroitin complx  [cholestatin]; Glucosamine forte [nutritional supplements]; and Glucosamine sulfate-msm   Social History    Socioeconomic History  . Marital status: Married    Spouse name: Not on file  . Number of children: Not on file  . Years of education: Not on file  . Highest education level: Not on file  Occupational History  . Not on file  Social Needs  . Financial resource strain: Not on file  . Food insecurity:    Worry: Not on file    Inability: Not on file  . Transportation needs:    Medical: Not on file    Non-medical: Not on file  Tobacco Use  . Smoking status: Never Smoker  . Smokeless tobacco: Never Used  Substance and Sexual Activity  . Alcohol use: No    Alcohol/week: 0.0 standard drinks  . Drug use: No  . Sexual activity: Not on file  Lifestyle  . Physical activity:    Days per week: Not on file    Minutes per session: Not on file  . Stress: Not on file  Relationships  . Social connections:    Talks on phone: Not on file    Gets together: Not on file    Attends religious service: Not on file    Active member of club or organization: Not on file    Attends meetings of clubs or organizations: Not on file    Relationship status: Not on file  Other Topics Concern  . Not on file  Social History Narrative  . Not on file     Family History: The patient's family history includes Cancer in her maternal grandmother; Dementia in her paternal grandmother; Diabetes in her brother; Heart Problems in her father; Kidney failure in her brother; Osteoporosis in her mother; Stroke in her father; Suicidality in her unknown relative. There is no history of Heart attack.  ROS:   Please see the history of present illness.    ROS  All other systems reviewed and negative.   EKGs/Labs/Other Studies Reviewed:    The following studies were reviewed today: PAP download  EKG:  EKG is not ordered today.    Recent Labs: 08/19/2017: ALT 16; BUN 17; Creatinine, Ser 0.82; Hemoglobin 15.7; Platelets 244; Potassium 4.3; Sodium 138   Recent Lipid Panel    Component Value Date/Time   CHOL 145  08/19/2017 0816   TRIG 101 08/19/2017 0816   HDL 47 08/19/2017 0816   CHOLHDL 3.1 08/19/2017 0816   LDLCALC 78 08/19/2017 0816    Physical Exam:    VS:  BP 124/78   Pulse 84   Ht 5\' 2"  (1.575 m)   Wt 201 lb 6.4 oz (91.4 kg)   SpO2 98%   BMI 36.84 kg/m     Wt Readings from Last 3 Encounters:  02/23/18 201 lb 6.4 oz (91.4 kg)  08/18/17 197 lb (89.4 kg)  02/19/17 197 lb 12.8 oz (89.7 kg)     GEN:  Well nourished, well developed in no acute distress HEENT: Normal NECK: No JVD; No carotid bruits LYMPHATICS: No lymphadenopathy CARDIAC: irregularly irregular, no murmurs, rubs, gallops RESPIRATORY:  Clear to auscultation without rales, wheezing or rhonchi  ABDOMEN: Soft, non-tender, non-distended MUSCULOSKELETAL:  No edema; No deformity  SKIN: Warm and dry NEUROLOGIC:  Alert and oriented x 3 PSYCHIATRIC:  Normal affect   ASSESSMENT:    1. OSA (obstructive sleep apnea)    PLAN:    In order of problems listed above:  1.  OSA - the patient is tolerating PAP therapy well without any problems. The PAP download was reviewed today and showed an AHI of 1.2/hr on 6 cm H2O with 100% compliance in using more than 4 hours nightly.  The patient has been using and benefiting from PAP use and will continue to benefit from therapy.    Medication Adjustments/Labs and Tests Ordered: Current medicines are reviewed at length with the patient today.  Concerns regarding medicines are outlined above.  No orders of the defined types were placed in this encounter.  No orders of the defined types were placed in this encounter.   Signed, 02/21/17, MD  02/23/2018 3:52 PM    [facility]

## 2018-02-24 ENCOUNTER — Ambulatory Visit (INDEPENDENT_AMBULATORY_CARE_PROVIDER_SITE_OTHER): Payer: Medicare Other | Admitting: Licensed Clinical Social Worker

## 2018-02-24 DIAGNOSIS — F419 Anxiety disorder, unspecified: Secondary | ICD-10-CM

## 2018-03-10 ENCOUNTER — Ambulatory Visit (INDEPENDENT_AMBULATORY_CARE_PROVIDER_SITE_OTHER): Payer: Medicare Other | Admitting: Licensed Clinical Social Worker

## 2018-03-10 DIAGNOSIS — F419 Anxiety disorder, unspecified: Secondary | ICD-10-CM

## 2018-03-24 ENCOUNTER — Ambulatory Visit (INDEPENDENT_AMBULATORY_CARE_PROVIDER_SITE_OTHER): Payer: Medicare Other | Admitting: Licensed Clinical Social Worker

## 2018-03-24 DIAGNOSIS — F419 Anxiety disorder, unspecified: Secondary | ICD-10-CM

## 2018-03-26 ENCOUNTER — Encounter: Payer: Self-pay | Admitting: Interventional Cardiology

## 2018-03-26 ENCOUNTER — Ambulatory Visit (INDEPENDENT_AMBULATORY_CARE_PROVIDER_SITE_OTHER): Payer: Medicare Other | Admitting: Interventional Cardiology

## 2018-03-26 ENCOUNTER — Encounter

## 2018-03-26 VITALS — BP 116/78 | HR 95 | Ht 62.0 in | Wt 201.0 lb

## 2018-03-26 DIAGNOSIS — Z7901 Long term (current) use of anticoagulants: Secondary | ICD-10-CM

## 2018-03-26 DIAGNOSIS — G4733 Obstructive sleep apnea (adult) (pediatric): Secondary | ICD-10-CM | POA: Diagnosis not present

## 2018-03-26 DIAGNOSIS — E785 Hyperlipidemia, unspecified: Secondary | ICD-10-CM

## 2018-03-26 DIAGNOSIS — E669 Obesity, unspecified: Secondary | ICD-10-CM

## 2018-03-26 DIAGNOSIS — I4819 Other persistent atrial fibrillation: Secondary | ICD-10-CM

## 2018-03-26 NOTE — Patient Instructions (Signed)
Medication Instructions:  Your physician recommends that you continue on your current medications as directed. Please refer to the Current Medication list given to you today.  If you need a refill on your cardiac medications before your next appointment, please call your pharmacy.   Lab work: TODAY: CBC, BMET  If you have labs (blood work) drawn today and your tests are completely normal, you will receive your results only by: . MyChart Message (if you have MyChart) OR . A paper copy in the mail If you have any lab test that is abnormal or we need to change your treatment, we will call you to review the results.  Testing/Procedures: None ordered  Follow-Up: At [facility], you and your health needs are our priority.  As part of our continuing mission to provide you with exceptional heart care, we have created designated Provider Care Teams.  These Care Teams include your primary Cardiologist (physician) and Advanced Practice Providers (APPs -  Physician Assistants and Nurse Practitioners) who all work together to provide you with the care you need, when you need it. . You will need a follow up appointment in 6 months.  Please call our office 2 months in advance to schedule this appointment.  You may see Jay Varanasi, MD or one of the following Advanced Practice Providers on your designated Care Team:   . Brittainy Simmons, PA-C . Dayna Dunn, PA-C . Michele Lenze, PA-C  Any Other Special Instructions Will Be Listed Below (If Applicable).    

## 2018-03-26 NOTE — Progress Notes (Signed)
Cardiology Office Note   Date:  03/26/2018   ID:  Rebecca Short 22-Feb-1941, MRN [id number]  PCP:  Catha Gosselin, MD    No chief complaint on file.  AFib  Wt Readings from Last 3 Encounters:  03/26/18 201 lb (91.2 kg)  02/23/18 201 lb 6.4 oz (91.4 kg)  08/18/17 197 lb (89.4 kg)       History of Present Illness: Rebecca Short is a 77 y.o. female  who has a h/o PAF. She has chronic back pain which has limited her exercise. In the past, Managing hydration and stress have helped minimize her palpitations.   In 3/19, it was noted: "She is spending more time in atrial fibrillation now.  It will likely be more difficult to get her out of atrial fibrillation.  Given her lack of symptoms, would not pursue antiarrhythmic drug or cardioversion at this time.  She has been treated for sleep apnea with CPAP.  She went to [facility]. She was at higher elevation and she did well.  She did niot ned any extra metoprolol.   Denies : Chest pain. Dizziness. Leg edema. Nitroglycerin use. Orthopnea. Palpitations. Paroxysmal nocturnal dyspnea. Shortness of breath. Syncope.   Past Medical History:  Diagnosis Date  . A-fib (HCC)   . Allergy   . Macular degeneration   . OSA (obstructive sleep apnea)    mild with total AHI 7.25/hr and severe during REM sleep at 35/hr now on CPAP at 6cm H2O    Past Surgical History:  Procedure Laterality Date  . REPLACEMENT TOTAL KNEE BILATERAL       Current Outpatient Medications  Medication Sig Dispense Refill  . calcium citrate-vitamin D (CITRACAL+D) 315-200 MG-UNIT per tablet Take 2 tablets by mouth daily.    Marland Kitchen ELIQUIS 5 MG TABS tablet TAKE 1 TABLET (5 MG TOTAL) BY MOUTH 2 (TWO) TIMES DAILY. 60 tablet 10  . EPIPEN 2-PAK 0.3 MG/0.3ML SOAJ injection Inject 0.3 mg into the muscle as needed (anaphylaxis).   0  . metoprolol tartrate (LOPRESSOR) 25 MG tablet TAKE 1 TABLET BY MOUTH TWICE A DAY 180 tablet 2  . metroNIDAZOLE  (METROGEL) 0.75 % gel 1 APPLICATION APPLY ON THE SKIN TWICE A DAY APPLY TO FACE TWICE DAILY  2  . Multiple Vitamin (MULTIVITAMIN WITH MINERALS) TABS tablet Take 1 tablet by mouth daily.    . Multiple Vitamins-Minerals (PRESERVISION AREDS PO) Take 1 capsule by mouth 2 (two) times daily. Eye vitamins     No current facility-administered medications for this visit.     Allergies:   Condrolite [glucosamine-chondroitin-msm]; Glucosamine; Glucosamine chondroitin complx  [cholestatin]; Glucosamine forte [nutritional supplements]; and Glucosamine sulfate-msm    Social History:  The patient  reports that she has never smoked. She has never used smokeless tobacco. She reports that she does not drink alcohol or use drugs.   Family History:  The patient's family history includes Cancer in her maternal grandmother; Dementia in her paternal grandmother; Diabetes in her brother; Heart Problems in her father; Kidney failure in her brother; Osteoporosis in her mother; Stroke in her father; Suicidality in her unknown relative.    ROS:  Please see the history of present illness.   Otherwise, review of systems are positive for mild weight gain.   All other systems are reviewed and negative.    PHYSICAL EXAM: VS:  BP 116/78   Pulse 95   Ht 5\' 2"  (1.575 m)   Wt 201 lb (91.2 kg)  SpO2 95%   BMI 36.76 kg/m  , BMI Body mass index is 36.76 kg/m. GEN: Well nourished, well developed, in no acute distress  HEENT: normal  Neck: no JVD, carotid bruits, or masses Cardiac: irregularly irregular; no murmurs, rubs, or gallops,no edema  Respiratory:  clear to auscultation bilaterally, normal work of breathing GI: soft, nontender, nondistended, + BS MS: no deformity or atrophy  Skin: warm and dry, no rash Neuro:  Strength and sensation are intact Psych: euthymic mood, full affect   EKG:   The ekg ordered today demonstrates AFib, rate controlled   Recent Labs: 08/19/2017: ALT 16; BUN 17; Creatinine, Ser 0.82;  Hemoglobin 15.7; Platelets 244; Potassium 4.3; Sodium 138   Lipid Panel    Component Value Date/Time   CHOL 145 08/19/2017 0816   TRIG 101 08/19/2017 0816   HDL 47 08/19/2017 0816   CHOLHDL 3.1 08/19/2017 0816   LDLCALC 78 08/19/2017 0816     Other studies Reviewed: Additional studies/ records that were reviewed today with results demonstrating: LDL 78 in March 2019.   ASSESSMENT AND PLAN:  1. Persistent AFib: Rate controlled.  Continue current dose of metoprolol.  She is not feeling any palpitations.  We spoke about the rationale behind rate control versus rhythm control.  Given the lack of symptoms, would continue with rate control and anticoagulation strategy. 2. Anticoagulated: Some easy bruising.  No significant bleeding issues with Eliquis. 3. OSA: Using CPAP without a problem.  Follows up with Dr. Mayford Knife.  Her note was reviewed. 4. Lipids well controlled.  Continue healthy diet. 5. Obesity: Continue with regular exercise and healthy diet.  Now that she is back home, her diet will be better.   Current medicines are reviewed at length with the patient today.  The patient concerns regarding her medicines were addressed.  The following changes have been made:  No change  Labs/ tests ordered today include:  No orders of the defined types were placed in this encounter.   Recommend 150 minutes/week of aerobic exercise Low fat, low carb, high fiber diet recommended  Disposition:   FU in 6 months   Signed, [name], MD  03/26/2018 1:51 PM    [facility] [address] [place], [place], [place]  [address] Phone: [phone]; Fax: [phone]

## 2018-03-27 LAB — CBC
HEMOGLOBIN: 15.3 g/dL (ref 11.1–15.9)
Hematocrit: 44.6 % (ref 34.0–46.6)
MCH: 30.1 pg (ref 26.6–33.0)
MCHC: 34.3 g/dL (ref 31.5–35.7)
MCV: 88 fL (ref 79–97)
PLATELETS: 224 10*3/uL (ref 150–450)
RBC: 5.08 x10E6/uL (ref 3.77–5.28)
RDW: 13.2 % (ref 12.3–15.4)
WBC: 6.5 10*3/uL (ref 3.4–10.8)

## 2018-03-27 LAB — BASIC METABOLIC PANEL
BUN/Creatinine Ratio: 23 (ref 12–28)
BUN: 18 mg/dL (ref 8–27)
CALCIUM: 9.3 mg/dL (ref 8.7–10.3)
CHLORIDE: 100 mmol/L (ref 96–106)
CO2: 26 mmol/L (ref 20–29)
Creatinine, Ser: 0.77 mg/dL (ref 0.57–1.00)
GFR calc Af Amer: 86 mL/min/{1.73_m2} (ref >59)
GFR, EST NON AFRICAN AMERICAN: 74 mL/min/{1.73_m2} (ref >59)
Glucose: 79 mg/dL (ref 65–99)
POTASSIUM: 4.6 mmol/L (ref 3.5–5.2)
Sodium: 137 mmol/L (ref 134–144)

## 2018-04-07 ENCOUNTER — Ambulatory Visit (INDEPENDENT_AMBULATORY_CARE_PROVIDER_SITE_OTHER): Payer: Medicare Other | Admitting: Licensed Clinical Social Worker

## 2018-04-07 DIAGNOSIS — F419 Anxiety disorder, unspecified: Secondary | ICD-10-CM | POA: Diagnosis not present

## 2018-04-21 ENCOUNTER — Ambulatory Visit: Payer: Medicare Other | Admitting: Licensed Clinical Social Worker

## 2018-05-05 ENCOUNTER — Ambulatory Visit (INDEPENDENT_AMBULATORY_CARE_PROVIDER_SITE_OTHER): Payer: Medicare Other | Admitting: Licensed Clinical Social Worker

## 2018-05-05 DIAGNOSIS — F419 Anxiety disorder, unspecified: Secondary | ICD-10-CM

## 2018-06-02 ENCOUNTER — Ambulatory Visit (INDEPENDENT_AMBULATORY_CARE_PROVIDER_SITE_OTHER): Payer: Medicare Other | Admitting: Licensed Clinical Social Worker

## 2018-06-02 DIAGNOSIS — F419 Anxiety disorder, unspecified: Secondary | ICD-10-CM | POA: Diagnosis not present

## 2018-06-16 ENCOUNTER — Ambulatory Visit (INDEPENDENT_AMBULATORY_CARE_PROVIDER_SITE_OTHER): Payer: Medicare Other | Admitting: Licensed Clinical Social Worker

## 2018-06-16 DIAGNOSIS — F419 Anxiety disorder, unspecified: Secondary | ICD-10-CM | POA: Diagnosis not present

## 2018-06-30 ENCOUNTER — Ambulatory Visit (INDEPENDENT_AMBULATORY_CARE_PROVIDER_SITE_OTHER): Payer: Medicare Other | Admitting: Licensed Clinical Social Worker

## 2018-06-30 DIAGNOSIS — F419 Anxiety disorder, unspecified: Secondary | ICD-10-CM

## 2018-07-14 ENCOUNTER — Ambulatory Visit (INDEPENDENT_AMBULATORY_CARE_PROVIDER_SITE_OTHER): Payer: Medicare Other | Admitting: Licensed Clinical Social Worker

## 2018-07-14 DIAGNOSIS — F419 Anxiety disorder, unspecified: Secondary | ICD-10-CM | POA: Diagnosis not present

## 2018-07-24 ENCOUNTER — Other Ambulatory Visit: Payer: Self-pay | Admitting: Interventional Cardiology

## 2018-07-26 NOTE — Telephone Encounter (Signed)
Eliquis 5mg  reill request received; pt is 78 yrs old, wt-91.2kg, Crea-0.77 on 03/26/2018, last seen by Dr. Eldridge Dace on 03/26/2018; will send in refill to requested pharmacy.

## 2018-07-27 ENCOUNTER — Ambulatory Visit: Payer: Medicare Other | Admitting: Licensed Clinical Social Worker

## 2018-07-28 ENCOUNTER — Ambulatory Visit (INDEPENDENT_AMBULATORY_CARE_PROVIDER_SITE_OTHER): Payer: Medicare Other | Admitting: Licensed Clinical Social Worker

## 2018-07-28 DIAGNOSIS — F419 Anxiety disorder, unspecified: Secondary | ICD-10-CM

## 2018-07-29 ENCOUNTER — Other Ambulatory Visit: Payer: Self-pay | Admitting: Interventional Cardiology

## 2018-08-11 ENCOUNTER — Ambulatory Visit: Payer: Medicare Other | Admitting: Licensed Clinical Social Worker

## 2018-08-25 ENCOUNTER — Ambulatory Visit: Payer: Medicare Other | Admitting: Licensed Clinical Social Worker

## 2018-09-07 ENCOUNTER — Telehealth: Payer: Self-pay

## 2018-09-07 NOTE — Telephone Encounter (Signed)
Virtual Visit Pre-Appointment Phone Call  Steps For Call:  1. Confirm consent - "In the setting of the current Covid19 crisis, you are scheduled for a (phone or video) visit with your provider on (date) at (time).  Just as we do with many in-office visits, in order for you to participate in this visit, we must obtain consent.  If you'd like, I can send this to your mychart (if signed up) or email for you to review.  Otherwise, I can obtain your verbal consent now.  All virtual visits are billed to your insurance company just like a normal visit would be.  By agreeing to a virtual visit, we'd like you to understand that the technology does not allow for your provider to perform an examination, and thus may limit your provider's ability to fully assess your condition.  Finally, though the technology is pretty good, we cannot assure that it will always work on either your or our end, and in the setting of a video visit, we may have to convert it to a phone-only visit.  In either situation, we cannot ensure that we have a secure connection.  Are you willing to proceed?" STAFF: Did the patient verbally acknowledge consent to telehealth visit? YES  2. Confirm the BEST phone number to call the day of the visit: [phone]  3. Give patient instructions for WebEx/MyChart download to smartphone as below or Doximity/Doxy.me if video visit (depending on what platform provider is using)  4. Advise patient to be prepared with any vital sign or heart rhythm information, their current medicines, and a piece of paper and pen handy for any instructions they may receive the day of their visit  5. Inform patient they will receive a phone call 15 minutes prior to their appointment time (may be from unknown caller ID) so they should be prepared to answer  6. Confirm that appointment type is correct in Epic appointment notes (video vs telephone)     TELEPHONE CALL NOTE  Rebecca Short has been deemed a  candidate for a follow-up tele-health visit to limit community exposure during the Covid-19 pandemic. I spoke with the patient via phone to ensure availability of phone/video source, confirm preferred email & phone number, and discuss instructions and expectations.  I reminded Rebecca Short to be prepared with any vital sign and/or heart rhythm information that could potentially be obtained via home monitoring, at the time of her visit. I reminded Rebecca Short to expect a phone call at the time of her visit if her visit.  Clide Dales Tamy Accardo, CMA 09/07/2018 2:01 PM   DOWNLOADING THE WEBEX APP TO SMARTPHONE  - If Apple, ask patient to go to App Store and type in WebEx in the search bar. Download [employer], the blue/green circle. If Android, go to [employer] and type in [employer] in the search bar. The app is free but as with any other app downloads, their phone may require them to verify saved payment information or Apple/Android password.  - The patient does NOT have to create an account. - On the day of the visit, the assist will walk the patient through joining the meeting with the meeting number/password.  DOWNLOADING THE MYCHART APP TO SMARTPHONE  - If Apple, go to [employer] and type in MyChart in the search bar and download the app. If Android, ask patient to go to [employer] and type in [place] in the search bar and download  the app. The app is free but as with any other app downloads, their phone may require them to verify saved payment information or Apple/Android password.  - The patient will need to then log into the app with their MyChart username and password, and select [facility] as their healthcare provider to link the account. When it is time for your visit, go to the MyChart app, find appointments, and click Begin Video Visit. Be sure to Select Allow for your device to access the Microphone and Camera for your visit. You will then be connected, and your provider  will be with you shortly.  **If they have any issues connecting, or need assistance please contact [facility] [phone])**  **If using a computer, in order to ensure the best quality for your visit they will need to use either of the following Internet Browsers: Microsoft [place], or Google Chrome**  [place]   I hereby voluntarily request, consent and authorize [facility] and its employed or contracted physicians, physician assistants, nurse practitioners or other licensed health care professionals (the Practitioner), to provide me with telemedicine health care services (the Services") as deemed necessary by the treating Practitioner. I acknowledge and consent to receive the Services by the Practitioner via telemedicine. I understand that the telemedicine visit will involve communicating with the Practitioner through live audiovisual communication technology and the disclosure of certain medical information by electronic transmission. I acknowledge that I have been given the opportunity to request an in-person assessment or other available alternative prior to the telemedicine visit and am voluntarily participating in the telemedicine visit.  I understand that I have the right to withhold or withdraw my consent to the use of telemedicine in the course of my care at any time, without affecting my right to future care or treatment, and that the Practitioner or I may terminate the telemedicine visit at any time. I understand that I have the right to inspect all information obtained and/or recorded in the course of the telemedicine visit and may receive copies of available information for a reasonable fee.  I understand that some of the potential risks of receiving the Services via telemedicine include:   Delay or interruption in medical evaluation due to technological equipment failure or disruption;  Information transmitted may not be  sufficient (e.g. poor resolution of images) to allow for appropriate medical decision making by the Practitioner; and/or   In rare instances, security protocols could fail, causing a breach of personal health information.  Furthermore, I acknowledge that it is my responsibility to provide information about my medical history, conditions and care that is complete and accurate to the best of my ability. I acknowledge that Practitioner's advice, recommendations, and/or decision may be based on factors not within their control, such as incomplete or inaccurate data provided by me or distortions of diagnostic images or specimens that may result from electronic transmissions. I understand that the practice of medicine is not an exact science and that Practitioner makes no warranties or guarantees regarding treatment outcomes. I acknowledge that I will receive a copy of this consent concurrently upon execution via email to the email address I last provided but may also request a printed copy by calling the office of [facility].    I understand that my insurance will be billed for this visit.   I have read or had this consent read to me.  I understand the contents of this consent, which adequately explains the benefits  and risks of the Services being provided via telemedicine.   I have been provided ample opportunity to ask questions regarding this consent and the Services and have had my questions answered to my satisfaction.  I give my informed consent for the services to be provided through the use of telemedicine in my medical care  By participating in this telemedicine visit I agree to the above.

## 2018-09-08 ENCOUNTER — Ambulatory Visit: Payer: Medicare Other | Admitting: Licensed Clinical Social Worker

## 2018-09-14 ENCOUNTER — Telehealth (INDEPENDENT_AMBULATORY_CARE_PROVIDER_SITE_OTHER): Payer: Medicare Other | Admitting: Interventional Cardiology

## 2018-09-14 ENCOUNTER — Encounter: Payer: Self-pay | Admitting: Interventional Cardiology

## 2018-09-14 ENCOUNTER — Other Ambulatory Visit: Payer: Self-pay

## 2018-09-14 VITALS — Ht 62.0 in | Wt 192.0 lb

## 2018-09-14 DIAGNOSIS — G4733 Obstructive sleep apnea (adult) (pediatric): Secondary | ICD-10-CM | POA: Diagnosis not present

## 2018-09-14 DIAGNOSIS — Z7901 Long term (current) use of anticoagulants: Secondary | ICD-10-CM | POA: Diagnosis not present

## 2018-09-14 DIAGNOSIS — I4819 Other persistent atrial fibrillation: Secondary | ICD-10-CM

## 2018-09-14 DIAGNOSIS — E669 Obesity, unspecified: Secondary | ICD-10-CM

## 2018-09-14 NOTE — Progress Notes (Signed)
Virtual Visit via Video Note   This visit type was conducted due to national recommendations for restrictions regarding the COVID-19 Pandemic (e.g. social distancing) in an effort to limit this patient's exposure and mitigate transmission in our community.  Due to her co-morbid illnesses, this patient is at least at moderate risk for complications without adequate follow up.  This format is felt to be most appropriate for this patient at this time.  All issues noted in this document were discussed and addressed.  A limited physical exam was performed with this format.  Please refer to the patient's chart for her consent to telehealth for [facility].   Attempted video call, but patient unable to acces her camera.  Evaluation Performed:  Follow-up visit  Date:  09/14/2018   ID:  Rebecca, Short 31-Jul-1940, MRN [id number]  Patient Location: Home Provider Location: Office  PCP:  Catha Gosselin, MD  Cardiologist:  No primary care provider on file. Varanasi Electrophysiologist:  None   Chief Complaint:  Persistent AFib  History of Present Illness:    Rebecca Short is a 78 y.o. female with a h/o PAF. She has chronic back pain which has limited her exercise. In the past, Managing hydration and stress have helped minimize her palpitations.   In 3/19, it was noted: "She is spending more time in atrial fibrillation now. It will likely be more difficult to get her out of atrial fibrillation. Given her lack of symptoms, would not pursue antiarrhythmic drug or cardioversion at this time.  She has been treated for sleep apnea with CPAP.  She went to [facility] [place] in 2019. She was at higher elevation and she did well.  She did niot ned any extra metoprolol.   The patient does not have symptoms concerning for COVID-19 infection (fever, chills, cough, or new shortness of breath).   Denies : Chest pain. Dizziness. Leg edema. Nitroglycerin use. Orthopnea. Palpitations.  Paroxysmal nocturnal dyspnea. Shortness of breath. Syncope.    Past Medical History:  Diagnosis Date  . A-fib (HCC)   . Allergy   . Macular degeneration   . OSA (obstructive sleep apnea)    mild with total AHI 7.25/hr and severe during REM sleep at 35/hr now on CPAP at 6cm H2O   Past Surgical History:  Procedure Laterality Date  . REPLACEMENT TOTAL KNEE BILATERAL       Current Meds  Medication Sig  . calcium citrate-vitamin D (CITRACAL+D) 315-200 MG-UNIT per tablet Take 2 tablets by mouth daily.  Marland Kitchen ELIQUIS 5 MG TABS tablet TAKE 1 TABLET (5 MG TOTAL) BY MOUTH 2 (TWO) TIMES DAILY.  Marland Kitchen EPIPEN 2-PAK 0.3 MG/0.3ML SOAJ injection Inject 0.3 mg into the muscle as needed (anaphylaxis).   . metoprolol tartrate (LOPRESSOR) 25 MG tablet TAKE 1 TABLET BY MOUTH TWICE A DAY  . metroNIDAZOLE (METROGEL) 0.75 % gel 1 APPLICATION APPLY ON THE SKIN TWICE A DAY APPLY TO FACE TWICE DAILY  . Multiple Vitamin (MULTIVITAMIN WITH MINERALS) TABS tablet Take 1 tablet by mouth daily.  . Multiple Vitamins-Minerals (PRESERVISION AREDS PO) Take 1 capsule by mouth 2 (two) times daily. Eye vitamins     Allergies:   Condrolite [glucosamine-chondroitin-msm]; Glucosamine; Glucosamine chondroitin complx  [cholestatin]; Glucosamine forte [nutritional supplements]; and Glucosamine sulfate-msm   Social History   Tobacco Use  . Smoking status: Never Smoker  . Smokeless tobacco: Never Used  Substance Use Topics  . Alcohol use: No    Alcohol/week: 0.0 standard drinks  .  Drug use: No     Family Hx: The patient's family history includes Cancer in her maternal grandmother; Dementia in her paternal grandmother; Diabetes in her brother; Heart Problems in her father; Kidney failure in her brother; Osteoporosis in her mother; Stroke in her father; Suicidality in her unknown relative. There is no history of Heart attack.  ROS:   Please see the history of present illness.    Notices an irregular pulse when she checks with  her finger All other systems reviewed and are negative.   Prior CV studies:   The following studies were reviewed today: labs were reviewed    Labs/Other Tests and Data Reviewed:    EKG:  An ECG dated 11/19 was personally reviewed today and demonstrated:  Rate controlled AFib  Recent Labs: 03/26/2018: BUN 18; Creatinine, Ser 0.77; Hemoglobin 15.3; Platelets 224; Potassium 4.6; Sodium 137   Recent Lipid Panel Lab Results  Component Value Date/Time   CHOL 145 08/19/2017 08:16 AM   TRIG 101 08/19/2017 08:16 AM   HDL 47 08/19/2017 08:16 AM   CHOLHDL 3.1 08/19/2017 08:16 AM   LDLCALC 78 08/19/2017 08:16 AM    Wt Readings from Last 3 Encounters:  09/14/18 192 lb (87.1 kg)  03/26/18 201 lb (91.2 kg)  02/23/18 201 lb 6.4 oz (91.4 kg)     Objective:    Vital Signs:  Ht 5\' 2"  (1.575 m)   Wt 192 lb (87.1 kg)   BMI 35.12 kg/m    VITAL SIGNS:  reviewed GEN:  no acute distress RESPIRATORY:  no apparent shortness of breath exam is limited  ASSESSMENT & PLAN:    1. Persistent AFib: No sx.  Pulse rate has been controlled.  Eliquis for stroke prevention. 2. Anticoagulated: No bleeding issues.  Bruises easily.  3. OSA: Using CPAP.  Tolerating this well.  4. Hyperlipidemia: The current medical regimen is effective;  continue present plan and medications. 5. Obesity: She has lost 10 lbs of late with diet and exercise. COntinue lifestyle changes.    COVID-19 Education: The signs and symptoms of COVID-19 were discussed with the patient and how to seek care for testing (follow up with PCP or arrange E-visit).  The importance of social distancing was discussed today.  Time:   Today, I have spent 15 minutes with the patient with telehealth technology discussing the above problems.     Medication Adjustments/Labs and Tests Ordered: Current medicines are reviewed at length with the patient today.  Concerns regarding medicines are outlined above.   Tests Ordered: No orders of the  defined types were placed in this encounter.   Medication Changes: No orders of the defined types were placed in this encounter.   Disposition:  Follow up in 6 month(s)  Signed, [name], MD  09/14/2018 2:56 PM    [facility]

## 2018-09-14 NOTE — Patient Instructions (Addendum)
Medication Instructions:  Your physician recommends that you continue on your current medications as directed. Please refer to the Current Medication list given to you today.  If you need a refill on your cardiac medications before your next appointment, please call your pharmacy.   Lab work: Your physician recommends that you return for lab work (CBC, BMET) on 12/27/18   If you have labs (blood work) drawn today and your tests are completely normal, you will receive your results only by: Marland Kitchen MyChart Message (if you have MyChart) OR . A paper copy in the mail If you have any lab test that is abnormal or we need to change your treatment, we will call you to review the results.  Testing/Procedures: None ordered  Follow-Up: At [facility], you and your health needs are our priority.  As part of our continuing mission to provide you with exceptional heart care, we have created designated Provider Care Teams.  These Care Teams include your primary Cardiologist (physician) and Advanced Practice Providers (APPs -  Physician Assistants and Nurse Practitioners) who all work together to provide you with the care you need, when you need it. . You will need a follow up appointment in 6 months.  Please call our office 2 months in advance to schedule this appointment.  You may see Everette Rank, MD or one of the following Advanced Practice Providers on your designated Care Team:   . Robbie Lis, PA-C . Dayna Dunn, PA-C . Jacolyn Reedy, PA-C  Any Other Special Instructions Will Be Listed Below (If Applicable).

## 2018-09-22 ENCOUNTER — Ambulatory Visit: Payer: Medicare Other | Admitting: Licensed Clinical Social Worker

## 2018-10-06 ENCOUNTER — Ambulatory Visit: Payer: Medicare Other | Admitting: Licensed Clinical Social Worker

## 2018-10-20 ENCOUNTER — Ambulatory Visit: Payer: Medicare Other | Admitting: Licensed Clinical Social Worker

## 2018-10-22 ENCOUNTER — Other Ambulatory Visit: Payer: Self-pay | Admitting: Interventional Cardiology

## 2018-11-03 ENCOUNTER — Ambulatory Visit: Payer: Medicare Other | Admitting: Licensed Clinical Social Worker

## 2018-12-15 ENCOUNTER — Ambulatory Visit: Payer: Medicare Other | Admitting: Licensed Clinical Social Worker

## 2018-12-27 ENCOUNTER — Other Ambulatory Visit: Payer: Self-pay

## 2018-12-27 ENCOUNTER — Other Ambulatory Visit: Payer: Medicare Other | Admitting: *Deleted

## 2018-12-27 DIAGNOSIS — I4819 Other persistent atrial fibrillation: Secondary | ICD-10-CM

## 2018-12-27 DIAGNOSIS — Z7901 Long term (current) use of anticoagulants: Secondary | ICD-10-CM

## 2018-12-27 LAB — BASIC METABOLIC PANEL
BUN/Creatinine Ratio: 20 (ref 12–28)
BUN: 15 mg/dL (ref 8–27)
CO2: 24 mmol/L (ref 20–29)
Calcium: 9.2 mg/dL (ref 8.7–10.3)
Chloride: 99 mmol/L (ref 96–106)
Creatinine, Ser: 0.74 mg/dL (ref 0.57–1.00)
GFR calc Af Amer: 90 mL/min/{1.73_m2} (ref >59)
GFR calc non Af Amer: 78 mL/min/{1.73_m2} (ref >59)
Glucose: 73 mg/dL (ref 65–99)
Potassium: 4.4 mmol/L (ref 3.5–5.2)
Sodium: 138 mmol/L (ref 134–144)

## 2018-12-27 LAB — CBC
Hematocrit: 47.4 % — ABNORMAL HIGH (ref 34.0–46.6)
Hemoglobin: 15.7 g/dL (ref 11.1–15.9)
MCH: 29.7 pg (ref 26.6–33.0)
MCHC: 33.1 g/dL (ref 31.5–35.7)
MCV: 90 fL (ref 79–97)
Platelets: 213 10*3/uL (ref 150–450)
RBC: 5.28 x10E6/uL (ref 3.77–5.28)
RDW: 13.1 % (ref 11.7–15.4)
WBC: 5.8 10*3/uL (ref 3.4–10.8)

## 2018-12-29 ENCOUNTER — Ambulatory Visit: Payer: Medicare Other | Admitting: Licensed Clinical Social Worker

## 2019-01-12 ENCOUNTER — Ambulatory Visit: Payer: Medicare Other | Admitting: Licensed Clinical Social Worker

## 2019-01-26 ENCOUNTER — Ambulatory Visit: Payer: Medicare Other | Admitting: Licensed Clinical Social Worker

## 2019-02-24 ENCOUNTER — Telehealth: Payer: Self-pay | Admitting: *Deleted

## 2019-02-24 NOTE — Telephone Encounter (Signed)
Virtual Visit Pre-Appointment Phone Call  "(Name), I am calling you today to discuss your upcoming appointment. We are currently trying to limit exposure to the virus that causes COVID-19 by seeing patients at home rather than in the office."  1. "What is the BEST phone number to call the day of the visit?" - include this in appointment notes  2. "Do you have or have access to (through a family member/friend) a smartphone with video capability that we can use for your visit?" a. If yes - list this number in appt notes as "cell" (if different from BEST phone #) and list the appointment type as a VIDEO visit in appointment notes b. If no - list the appointment type as a PHONE visit in appointment notes  3. Confirm consent - "In the setting of the current Covid19 crisis, you are scheduled for a (phone or video) visit with your provider on (date) at (time).  Just as we do with many in-office visits, in order for you to participate in this visit, we must obtain consent.  If you'd like, I can send this to your mychart (if signed up) or email for you to review.  Otherwise, I can obtain your verbal consent now.  All virtual visits are billed to your insurance company just like a normal visit would be.  By agreeing to a virtual visit, we'd like you to understand that the technology does not allow for your provider to perform an examination, and thus may limit your provider's ability to fully assess your condition. If your provider identifies any concerns that need to be evaluated in person, we will make arrangements to do so.  Finally, though the technology is pretty good, we cannot assure that it will always work on either your or our end, and in the setting of a video visit, we may have to convert it to a phone-only visit.  In either situation, we cannot ensure that we have a secure connection.  Are you willing to proceed?" STAFF: Did the patient verbally acknowledge consent to telehealth visit? Document  YES/NO here: YES  4. Advise patient to be prepared - "Two hours prior to your appointment, go ahead and check your blood pressure, pulse, oxygen saturation, and your weight (if you have the equipment to check those) and write them all down. When your visit starts, your provider will ask you for this information. If you have an Apple Watch or Kardia device, please plan to have heart rate information ready on the day of your appointment. Please have a pen and paper handy nearby the day of the visit as well."  5. Give patient instructions for MyChart download to smartphone OR Doximity/Doxy.me as below if video visit (depending on what platform provider is using)  6. Inform patient they will receive a phone call 15 minutes prior to their appointment time (may be from unknown caller ID) so they should be prepared to answer    TELEPHONE CALL NOTE  Rebecca Short has been deemed a candidate for a follow-up tele-health visit to limit community exposure during the Covid-19 pandemic. I spoke with the patient via phone to ensure availability of phone/video source, confirm preferred email & phone number, and discuss instructions and expectations.  I reminded Rebecca Short to be prepared with any vital sign and/or heart rhythm information that could potentially be obtained via home monitoring, at the time of her visit. I reminded Rebecca Short to expect a phone call prior to  her visit.  [name], CMA 02/24/2019 6:26 PM   INSTRUCTIONS FOR DOWNLOADING THE MYCHART APP TO SMARTPHONE  - The patient must first make sure to have activated MyChart and know their login information - If Apple, go to [employer] and type in MyChart in the search bar and download the app. If Android, ask patient to go to [employer] and type in [place] in the search bar and download the app. The app is free but as with any other app downloads, their phone may require them to verify saved payment information or  Apple/Android password.  - The patient will need to then log into the app with their MyChart username and password, and select [facility] as their healthcare provider to link the account. When it is time for your visit, go to the MyChart app, find appointments, and click Begin Video Visit. Be sure to Select Allow for your device to access the Microphone and Camera for your visit. You will then be connected, and your provider will be with you shortly.  **If they have any issues connecting, or need assistance please contact MyChart service desk [phone])**  **If using a computer, in order to ensure the best quality for their visit they will need to use either of the following Internet Browsers: [employer] or Google Chrome**  IF USING DOXIMITY or DOXY.ME - The patient will receive a link just prior to their visit by text.     FULL LENGTH CONSENT FOR TELE-HEALTH VISIT   I hereby voluntarily request, consent and authorize [facility] and its employed or contracted physicians, physician assistants, nurse practitioners or other licensed health care professionals (the Practitioner), to provide me with telemedicine health care services (the "Services") as deemed necessary by the treating Practitioner. I acknowledge and consent to receive the Services by the Practitioner via telemedicine. I understand that the telemedicine visit will involve communicating with the Practitioner through live audiovisual communication technology and the disclosure of certain medical information by electronic transmission. I acknowledge that I have been given the opportunity to request an in-person assessment or other available alternative prior to the telemedicine visit and am voluntarily participating in the telemedicine visit.  I understand that I have the right to withhold or withdraw my consent to the use of telemedicine in the course of my care at any time, without affecting my right to future care  or treatment, and that the Practitioner or I may terminate the telemedicine visit at any time. I understand that I have the right to inspect all information obtained and/or recorded in the course of the telemedicine visit and may receive copies of available information for a reasonable fee.  I understand that some of the potential risks of receiving the Services via telemedicine include:  Marland Kitchen Delay or interruption in medical evaluation due to technological equipment failure or disruption; . Information transmitted may not be sufficient (e.g. poor resolution of images) to allow for appropriate medical decision making by the Practitioner; and/or  . In rare instances, security protocols could fail, causing a breach of personal health information.  Furthermore, I acknowledge that it is my responsibility to provide information about my medical history, conditions and care that is complete and accurate to the best of my ability. I acknowledge that Practitioner's advice, recommendations, and/or decision may be based on factors not within their control, such as incomplete or inaccurate data provided by me or distortions of diagnostic images or specimens that may result from electronic transmissions.  I understand that the practice of medicine is not an exact science and that Practitioner makes no warranties or guarantees regarding treatment outcomes. I acknowledge that I will receive a copy of this consent concurrently upon execution via email to the email address I last provided but may also request a printed copy by calling the office of [facility].    I understand that my insurance will be billed for this visit.   I have read or had this consent read to me. . I understand the contents of this consent, which adequately explains the benefits and risks of the Services being provided via telemedicine.  . I have been provided ample opportunity to ask questions regarding this consent and the Services and have had  my questions answered to my satisfaction. . I give my informed consent for the services to be provided through the use of telemedicine in my medical care  By participating in this telemedicine visit I agree to the above.

## 2019-02-28 ENCOUNTER — Other Ambulatory Visit: Payer: Self-pay

## 2019-02-28 ENCOUNTER — Telehealth (INDEPENDENT_AMBULATORY_CARE_PROVIDER_SITE_OTHER): Payer: Medicare Other | Admitting: Cardiology

## 2019-02-28 ENCOUNTER — Encounter: Payer: Self-pay | Admitting: Cardiology

## 2019-02-28 VITALS — Ht 62.0 in | Wt 192.0 lb

## 2019-02-28 DIAGNOSIS — G4733 Obstructive sleep apnea (adult) (pediatric): Secondary | ICD-10-CM | POA: Diagnosis not present

## 2019-02-28 NOTE — Patient Instructions (Signed)
Medication Instructions:   If you need a refill on your cardiac medications before your next appointment, please call your pharmacy.   Lab work:  If you have labs (blood work) drawn today and your tests are completely normal, you will receive your results only by: . MyChart Message (if you have MyChart) OR . A paper copy in the mail If you have any lab test that is abnormal or we need to change your treatment, we will call you to review the results.  Testing/Procedures: None ordered today.  Follow-Up: At [facility], you and your health needs are our priority.  As part of our continuing mission to provide you with exceptional heart care, we have created designated Provider Care Teams.  These Care Teams include your primary Cardiologist (physician) and Advanced Practice Providers (APPs -  Physician Assistants and Nurse Practitioners) who all work together to provide you with the care you need, when you need it. You will need a follow up appointment in 1 years.  Please call our office 2 months in advance to schedule this appointment.  You may see Dr. Turner or one of the following Advanced Practice Providers on your designated Care Team:   Brittainy Simmons, PA-C Dayna Dunn, PA-C . Michele Lenze, PA-C   

## 2019-02-28 NOTE — Progress Notes (Signed)
Virtual Visit via Telephone  Note   This visit type was conducted due to national recommendations for restrictions regarding the COVID-19 Pandemic (e.g. social distancing) in an effort to limit this patient's exposure and mitigate transmission in our community.  Due to her co-morbid illnesses, this patient is at least at moderate risk for complications without adequate follow up.  This format is felt to be most appropriate for this patient at this time.  All issues noted in this document were discussed and addressed.  A limited physical exam was performed with this format.  Please refer to the patient's chart for her consent to telehealth for [facility].   Evaluation Performed:  Follow-up visit  This visit type was conducted due to national recommendations for restrictions regarding the COVID-19 Pandemic (e.g. social distancing).  This format is felt to be most appropriate for this patient at this time.  All issues noted in this document were discussed and addressed.  No physical exam was performed (except for noted visual exam findings with Video Visits).  Please refer to the patient's chart (MyChart message for video visits and phone note for telephone visits) for the patient's consent to telehealth for [facility].  Date:  02/28/2019   ID:  Rebecca, Short [birth year], MRN [id number]  Patient Location:  Home  Provider location:   [place]  PCP:  Hulan Fess, MD  Sleep Medicine:  Fransico Him, MD Electrophysiologist:  None   Chief Complaint:  OSA  History of Present Illness:    Rebecca Short is a 78 y.o. female who presents via audio/video conferencing for a telehealth visit today.    Rebecca Short is a 78 y.o. 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  She is doing well with her CPAP device. She is doing well with her CPAP device and thinks that she has gotten used to it.  She  tolerates the mask and feels the pressure is adequate.  Since going on CPAP she feels rested in the am and has no significant daytime sleepiness.  She denies any significant mouth or nasal dryness or nasal congestion.  She does not think that he snores.  She denies any chest pain or pressure, SOB, PND, orthopnea, palpitations or dizziness.   The patient does not have symptoms concerning for COVID-19 infection (fever, chills, cough, or new shortness of breath).   Prior CV studies:   The following studies were reviewed today:  PAP compliance download  Past Medical History:  Diagnosis Date  . A-fib ([place])   . Allergy   . Macular degeneration   . OSA (obstructive sleep apnea)    mild with total AHI 7.25/hr and severe during REM sleep at 35/hr now on CPAP at 6cm H2O   Past Surgical History:  Procedure Laterality Date  . REPLACEMENT TOTAL KNEE BILATERAL       Current Meds  Medication Sig  . calcium citrate-vitamin D (CITRACAL+D) 315-200 MG-UNIT per tablet Take 2 tablets by mouth daily.  Marland Kitchen ELIQUIS 5 MG TABS tablet TAKE 1 TABLET (5 MG TOTAL) BY MOUTH 2 (TWO) TIMES DAILY.  Marland Kitchen EPIPEN 2-PAK 0.3 MG/0.3ML SOAJ injection Inject 0.3 mg into the muscle as needed (anaphylaxis).   . metoprolol tartrate (LOPRESSOR) 25 MG tablet TAKE 1 TABLET BY MOUTH TWICE A DAY  . metroNIDAZOLE (METROGEL) 6.57 % gel 1 APPLICATION APPLY ON THE  SKIN TWICE A DAY APPLY TO FACE TWICE DAILY  . Multiple Vitamin (MULTIVITAMIN WITH MINERALS) TABS tablet Take 1 tablet by mouth daily.  . Multiple Vitamins-Minerals (PRESERVISION AREDS PO) Take 1 capsule by mouth 2 (two) times daily. Eye vitamins     Allergies:   Condrolite [glucosamine-chondroitin-msm], Glucosamine, Glucosamine forte [nutritional supplements], and Glucosamine sulfate-msm   Social History   Tobacco Use  . Smoking status: Never Smoker  . Smokeless tobacco: Never Used  Substance Use Topics  . Alcohol use: No    Alcohol/week: 0.0 standard drinks  . Drug use:  No     Family Hx: The patient's family history includes Cancer in her maternal grandmother; Dementia in her paternal grandmother; Diabetes in her brother; Heart Problems in her father; Kidney failure in her brother; Osteoporosis in her mother; Stroke in her father; Suicidality in her unknown relative. There is no history of Heart attack.  ROS:   Please see the history of present illness.     All other systems reviewed and are negative.   Labs/Other Tests and Data Reviewed:    Recent Labs: 12/27/2018: BUN 15; Creatinine, Ser 0.74; Hemoglobin 15.7; Platelets 213; Potassium 4.4; Sodium 138   Recent Lipid Panel Lab Results  Component Value Date/Time   CHOL 145 08/19/2017 08:16 AM   TRIG 101 08/19/2017 08:16 AM   HDL 47 08/19/2017 08:16 AM   CHOLHDL 3.1 08/19/2017 08:16 AM   LDLCALC 78 08/19/2017 08:16 AM    Wt Readings from Last 3 Encounters:  02/28/19 192 lb (87.1 kg)  09/14/18 192 lb (87.1 kg)  03/26/18 201 lb (91.2 kg)     Objective:    Vital Signs:  Ht 5\' 2"  (1.575 m)   Wt 192 lb (87.1 kg)   BMI 35.12 kg/m     ASSESSMENT & PLAN:    1.  OSA -   The patient is tolerating PAP therapy well without any problems. The PAP download was reviewed today and showed an AHI of 1.2/hr on 6 cm H2O with 100% compliance in using more than 4 hours nightly.  The patient has been using and benefiting from PAP use and will continue to benefit from therapy.   2.  Persistent AF -she says at times she can tell that she is in afib -continue Eliquis 5mg  BID and Lopressor  3.  Obesity  -I have encouraged her to continue in her routine exercise program and cut back on carbs and portions.   COVID-19 Education: The signs and symptoms of COVID-19 were discussed with the patient and how to seek care for testing (follow up with PCP or arrange E-visit).  The importance of social distancing was discussed today.  Patient Risk:   After full review of this patient's clinical status, I feel that they  are at least moderate risk at this time.  Time:   Today, I have spent 20 minutes directly with the patient on telemedicine discussing medical problems including OSA.  We also reviewed the symptoms of COVID 19 and the ways to protect against contracting the virus with telehealth technology.  I spent an additional 5 minutes reviewing patient's chart including PAP compliance download.  Medication Adjustments/Labs and Tests Ordered: Current medicines are reviewed at length with the patient today.  Concerns regarding medicines are outlined above.  Tests Ordered: No orders of the defined types were placed in this encounter.  Medication Changes: No orders of the defined types were placed in this encounter.   Disposition:  Follow up in  1 year(s)  Signed, Armanda Magic, MD  02/28/2019 2:08 PM    [facility]

## 2019-04-01 NOTE — Progress Notes (Signed)
Cardiology Office Note   Date:  04/04/2019   ID:  [place], Athanas [birth year], MRN [id number]  PCP:  Hulan Fess, MD    No chief complaint on file.  AFib  Wt Readings from Last 3 Encounters:  04/04/19 194 lb 12.8 oz (88.4 kg)  02/28/19 192 lb (87.1 kg)  09/14/18 192 lb (87.1 kg)       History of Present Illness: Rebecca Short is a 78 y.o. female  with a h/o PAF. She has chronic back pain which has limited her exercise. In the past, Managing hydration and stress have helped minimize her palpitations.   In 3/19, it was noted: "She is spending more time in atrial fibrillation now. It will likely be more difficult to get her out of atrial fibrillation. Given her lack of symptoms, would not pursue antiarrhythmic drug or cardioversion at this time.  She has been treated for sleep apnea with CPAP.  She went to [facility] [employer] in 2019. She was at higher elevation and she did well. She did not ned any extra metoprolol.   The patient does not have symptoms concerning for COVID-19 infection (fever, chills, cough, or new shortness of breath).    Denies : Chest pain. Dizziness. Leg edema. Nitroglycerin use. Orthopnea. Palpitations. Paroxysmal nocturnal dyspnea. Shortness of breath. Syncope.   Eats healthy.  Trying to decrease oprtion size.  TOlerating   CPAP.     Past Medical History:  Diagnosis Date  . A-fib ([place])   . Allergy   . Macular degeneration   . OSA (obstructive sleep apnea)    mild with total AHI 7.25/hr and severe during REM sleep at 35/hr now on CPAP at 6cm H2O    Past Surgical History:  Procedure Laterality Date  . REPLACEMENT TOTAL KNEE BILATERAL       Current Outpatient Medications  Medication Sig Dispense Refill  . calcium citrate-vitamin D (CITRACAL+D) 315-200 MG-UNIT per tablet Take 2 tablets by mouth daily.    Marland Kitchen ELIQUIS 5 MG TABS tablet TAKE 1 TABLET (5 MG TOTAL) BY MOUTH 2 (TWO) TIMES DAILY. 60 tablet 8  . EPIPEN 2-PAK  0.3 MG/0.3ML SOAJ injection Inject 0.3 mg into the muscle as needed (anaphylaxis).   0  . metoprolol tartrate (LOPRESSOR) 25 MG tablet TAKE 1 TABLET BY MOUTH TWICE A DAY 180 tablet 3  . metroNIDAZOLE (METROGEL) 4.81 % gel 1 APPLICATION APPLY ON THE SKIN TWICE A DAY APPLY TO FACE TWICE DAILY  2  . Multiple Vitamin (MULTIVITAMIN WITH MINERALS) TABS tablet Take 1 tablet by mouth daily.    . Multiple Vitamins-Minerals (PRESERVISION AREDS PO) Take 1 capsule by mouth 2 (two) times daily. Eye vitamins     No current facility-administered medications for this visit.     Allergies:   Condrolite [glucosamine-chondroitin-msm], Glucosamine, Glucosamine forte [nutritional supplements], and Glucosamine sulfate-msm    Social History:  The patient  reports that she has never smoked. She has never used smokeless tobacco. She reports that she does not drink alcohol or use drugs.   Family History:  The patient's family history includes Cancer in her maternal grandmother; Dementia in her paternal grandmother; Diabetes in her brother; Heart Problems in her father; Kidney failure in her brother; Osteoporosis in her mother; Stroke in her father; Suicidality in her unknown relative.    ROS:  Please see the history of present illness.   Otherwise, review of systems are positive for .   All other systems are reviewed  and negative.    PHYSICAL EXAM: VS:  BP 130/82   Pulse 75   Ht 5\' 2"  (1.575 m)   Wt 194 lb 12.8 oz (88.4 kg)   SpO2 96%   BMI 35.63 kg/m  , BMI Body mass index is 35.63 kg/m. GEN: Well nourished, well developed, in no acute distress  HEENT: normal  Neck: no JVD, carotid bruits, or masses Cardiac: irregularly iregular; no murmurs, rubs, or gallops,no edema  Respiratory:  clear to auscultation bilaterally, normal work of breathing GI: soft, nontender, nondistended, + BS MS: no deformity or atrophy  Skin: warm and dry, no rash Neuro:  Strength and sensation are intact Psych: euthymic mood, full  affect   EKG:   The ekg ordered today demonstrates AFib rate controlled.   Recent Labs: 12/27/2018: BUN 15; Creatinine, Ser 0.74; Hemoglobin 15.7; Platelets 213; Potassium 4.4; Sodium 138   Lipid Panel    Component Value Date/Time   CHOL 145 08/19/2017 0816   TRIG 101 08/19/2017 0816   HDL 47 08/19/2017 0816   CHOLHDL 3.1 08/19/2017 0816   LDLCALC 78 08/19/2017 0816     Other studies Reviewed: Additional studies/ records that were reviewed today with results demonstrating: labs reviewed.   ASSESSMENT AND PLAN:  1. Persistent AFib:  Rate control and anticoagulation has been the strategy.  2. Anticoagulated: Eliquis for stroke prevention 3. OSA:  CPAP tolerated well. 4. Normal Lipids.  5. Morbid Obesity: She is trying to decrease portion sizes.    Current medicines are reviewed at length with the patient today.  The patient concerns regarding her medicines were addressed.  The following changes have been made:  No change  Labs/ tests ordered today include:  No orders of the defined types were placed in this encounter.   Recommend 150 minutes/week of aerobic exercise Low fat, low carb, high fiber diet recommended  Disposition:   FU in 6 months virtual, 1 year in person   Signed, 08/21/2017, MD  04/04/2019 2:11 PM    [facility] [address] [place], [place], [place]  [place] Phone: [phone]; Fax: [phone]

## 2019-04-04 ENCOUNTER — Other Ambulatory Visit: Payer: Self-pay

## 2019-04-04 ENCOUNTER — Ambulatory Visit (INDEPENDENT_AMBULATORY_CARE_PROVIDER_SITE_OTHER): Payer: Medicare Other | Admitting: Interventional Cardiology

## 2019-04-04 ENCOUNTER — Encounter: Payer: Self-pay | Admitting: Interventional Cardiology

## 2019-04-04 VITALS — BP 130/82 | HR 75 | Ht 62.0 in | Wt 194.8 lb

## 2019-04-04 DIAGNOSIS — Z7901 Long term (current) use of anticoagulants: Secondary | ICD-10-CM

## 2019-04-04 DIAGNOSIS — G4733 Obstructive sleep apnea (adult) (pediatric): Secondary | ICD-10-CM | POA: Diagnosis not present

## 2019-04-04 DIAGNOSIS — I4819 Other persistent atrial fibrillation: Secondary | ICD-10-CM | POA: Diagnosis not present

## 2019-04-04 DIAGNOSIS — E669 Obesity, unspecified: Secondary | ICD-10-CM

## 2019-04-04 NOTE — Patient Instructions (Signed)
Medication Instructions:  Your physician recommends that you continue on your current medications as directed. Please refer to the Current Medication list given to you today.  *If you need a refill on your cardiac medications before your next appointment, please call your pharmacy*  Lab Work: None ordered If you have labs (blood work) drawn today and your tests are completely normal, you will receive your results only by: Marland Kitchen MyChart Message (if you have MyChart) OR . A paper copy in the mail If you have any lab test that is abnormal or we need to change your treatment, we will call you to review the results.  Testing/Procedures: None ordered  Follow-Up: At [facility], you and your health needs are our priority.  As part of our continuing mission to provide you with exceptional heart care, we have created designated Provider Care Teams.  These Care Teams include your primary Cardiologist (physician) and Advanced Practice Providers (APPs -  Physician Assistants and Nurse Practitioners) who all work together to provide you with the care you need, when you need it.  Your next appointment:   6 months  The format for your next appointment:   Virtual Visit   Provider:   You may see Casandra Doffing, MD or one of the following Advanced Practice Providers on your designated Care Team:    Melina Copa, PA-C  Ermalinda Barrios, PA-C   Other Instructions

## 2019-04-07 ENCOUNTER — Other Ambulatory Visit: Payer: Self-pay | Admitting: Interventional Cardiology

## 2019-04-07 NOTE — Telephone Encounter (Signed)
Prescription refill request for Eliquis received.  Last office visit: 04-04-2019, Dr. Irish Lack Scr: 0.74, 12-27-2018 Age: 78 y.o. Weight: 88.4 kg   Prescription refill sent.

## 2019-06-16 ENCOUNTER — Ambulatory Visit: Payer: Medicare Other | Attending: Internal Medicine

## 2019-06-16 ENCOUNTER — Ambulatory Visit: Payer: Medicare Other

## 2019-06-16 DIAGNOSIS — Z23 Encounter for immunization: Secondary | ICD-10-CM | POA: Insufficient documentation

## 2019-06-16 NOTE — Progress Notes (Signed)
   Covid-19 Vaccination Clinic  Name:  Rebecca Short    MRN: [id number] DOB: May 26, 1941  06/16/2019  [name] was observed post Covid-19 immunization for 15 minutes without incidence. She was provided with Vaccine Information Sheet and instruction to access the V-Safe system.   [name] was instructed to call 911 with any severe reactions post vaccine: Marland Kitchen Difficulty breathing  . Swelling of your face and throat  . A fast heartbeat  . A bad rash all over your body  . Dizziness and weakness    Immunizations Administered    Name Date Dose VIS Date Route   Pfizer COVID-19 Vaccine 06/16/2019 10:12 AM 0.3 mL 05/06/2019 Intramuscular   Manufacturer: [employer], [employer]   Lot: [id number]   NDC: [address]-2

## 2019-07-07 ENCOUNTER — Ambulatory Visit: Payer: Medicare Other | Attending: Internal Medicine

## 2019-07-07 DIAGNOSIS — Z23 Encounter for immunization: Secondary | ICD-10-CM | POA: Insufficient documentation

## 2019-07-07 NOTE — Progress Notes (Signed)
   Covid-19 Vaccination Clinic  Name:  Rebecca Short    MRN: [id number] DOB: [birth year]  07/07/2019  Ms. Rebecca Short was observed post Covid-19 immunization for 15 minutes without incidence. She was provided with Vaccine Information Sheet and instruction to access the V-Safe system.   Ms. Rebecca Short was instructed to call 911 with any severe reactions post vaccine: Marland Kitchen Difficulty breathing  . Swelling of your face and throat  . A fast heartbeat  . A bad rash all over your body  . Dizziness and weakness    Immunizations Administered    Name Date Dose VIS Date Route   Pfizer COVID-19 Vaccine 07/07/2019 10:44 AM 0.3 mL 05/06/2019 Intramuscular   Manufacturer: [employer], [employer]   Lot: [id number]   NDC: [address]-9

## 2019-09-24 ENCOUNTER — Other Ambulatory Visit: Payer: Self-pay | Admitting: Interventional Cardiology

## 2019-09-26 NOTE — Telephone Encounter (Signed)
Pt last saw Dr Eldridge Dace 04/04/19, last labs 12/27/18 Creat 0.74, age 79, weight 88.4kg, based on specified criteria pt is on appropriate dosage of Eliquis 5mg  BID.  Will refill rx.

## 2019-10-13 ENCOUNTER — Other Ambulatory Visit: Payer: Self-pay | Admitting: Interventional Cardiology

## 2019-10-20 NOTE — Progress Notes (Signed)
Cardiology Office Note   Date:  10/21/2019   ID:  Rebecca Short, Rebecca Short February 23, 1941, MRN [id number]  PCP:  Catha Gosselin, MD    No chief complaint on file.  AFib  Wt Readings from Last 3 Encounters:  10/21/19 192 lb 12.8 oz (87.5 kg)  04/04/19 194 lb 12.8 oz (88.4 kg)  02/28/19 192 lb (87.1 kg)       History of Present Illness: Rebecca Short is a 79 y.o. female  with a h/o PAF. She has chronic back pain which has limited her exercise. In the past, Managing hydration and stress have helped minimize her palpitations.   In 3/19, it was noted: "She is spending more time in atrial fibrillation now. It will likely be more difficult to get her out of atrial fibrillation. Given her lack of symptoms, would not pursue antiarrhythmic drug or cardioversion at this time.  She has been treated for sleep apnea with CPAP.  [facility] [place] She was at higher elevation and she did well. She did not ned any extra metoprolol.  The patientdoes nothave symptoms concerning for COVID-19 infection (fever, chills, cough, or new shortness of breath).   Denies : Chest pain. Dizziness. Leg edema. Nitroglycerin use. Orthopnea. Palpitations. Paroxysmal nocturnal dyspnea. Shortness of breath. Syncope.   Using treadmill several ties a week.  Limited by back pain.    She had her COVID vaccines.  THe day After the second pfizer shot, she felt some irregularity for 30 minutes and it resolved.    She remains very careful regarding COVID.  She socially distances.     Past Medical History:  Diagnosis Date  . A-fib (HCC)   . Allergy   . Macular degeneration   . OSA (obstructive sleep apnea)    mild with total AHI 7.25/hr and severe during REM sleep at 35/hr now on CPAP at 6cm H2O    Past Surgical History:  Procedure Laterality Date  . REPLACEMENT TOTAL KNEE BILATERAL       Current Outpatient Medications  Medication Sig Dispense Refill  . calcium  citrate-vitamin D (CITRACAL+D) 315-200 MG-UNIT per tablet Take 2 tablets by mouth daily.    Marland Kitchen ELIQUIS 5 MG TABS tablet TAKE 1 TABLET BY MOUTH TWICE A DAY 60 tablet 5  . EPIPEN 2-PAK 0.3 MG/0.3ML SOAJ injection Inject 0.3 mg into the muscle as needed (anaphylaxis).   0  . metoprolol tartrate (LOPRESSOR) 25 MG tablet TAKE 1 TABLET BY MOUTH TWICE A DAY 180 tablet 1  . metroNIDAZOLE (METROGEL) 0.75 % gel 1 APPLICATION APPLY ON THE SKIN TWICE A DAY APPLY TO FACE TWICE DAILY  2  . Multiple Vitamin (MULTIVITAMIN WITH MINERALS) TABS tablet Take 1 tablet by mouth daily.    . Multiple Vitamins-Minerals (PRESERVISION AREDS PO) Take 1 capsule by mouth 2 (two) times daily. Eye vitamins     No current facility-administered medications for this visit.    Allergies:   Condrolite [glucosamine-chondroitin-msm], Glucosamine, Glucosamine forte [nutritional supplements], and Glucosamine sulfate-msm    Social History:  The patient  reports that she has never smoked. She has never used smokeless tobacco. She reports that she does not drink alcohol or use drugs.   Family History:  The patient's family history includes Cancer in her maternal grandmother; Dementia in her paternal grandmother; Diabetes in her brother; Heart Problems in her father; Kidney failure in her brother; Osteoporosis in her mother; Stroke in her father; Suicidality in her unknown relative.  ROS:  Please see the history of present illness.   Otherwise, review of systems are positive for anxiety associated with the pandemic.   All other systems are reviewed and negative.    PHYSICAL EXAM: VS:  BP 120/70   Pulse 89   Ht 5\' 2"  (1.575 m)   Wt 192 lb 12.8 oz (87.5 kg)   SpO2 97%   BMI 35.26 kg/m  , BMI Body mass index is 35.26 kg/m. GEN: Well nourished, well developed, in no acute distress  HEENT: normal  Neck: no JVD, carotid bruits, or masses Cardiac: irregularly irregular; no murmurs, rubs, or gallops,no edema  Respiratory:  clear to  auscultation bilaterally, normal work of breathing GI: soft, nontender, nondistended, + BS MS: no deformity or atrophy  Skin: warm and dry, no rash Neuro:  Strength and sensation are intact Psych: euthymic mood, full affect   EKG:   The ekg ordered 03/2019 demonstrates AFib, rate controlled    Recent Labs: 12/27/2018: BUN 15; Creatinine, Ser 0.74; Hemoglobin 15.7; Platelets 213; Potassium 4.4; Sodium 138   Lipid Panel    Component Value Date/Time   CHOL 145 08/19/2017 0816   TRIG 101 08/19/2017 0816   HDL 47 08/19/2017 0816   CHOLHDL 3.1 08/19/2017 0816   LDLCALC 78 08/19/2017 0816     Other studies Reviewed: Additional studies/ records that were reviewed today with results demonstrating: .   ASSESSMENT AND PLAN:  1. Persistent atrial fibrillation: Rate control and anticoagulation has been the strategy.  She remains without palpitations.  2. Anticoagulated: Eliquis for stroke prevention. 3. OSA: CPAP has been well-tolerated. Using it every night.  4. Morbid obesity: Whole food, plant-based diet recommended.  She tries to exercise.    Current medicines are reviewed at length with the patient today.  The patient concerns regarding her medicines were addressed.  The following changes have been made:  No change  Labs/ tests ordered today include:  No orders of the defined types were placed in this encounter.   Recommend 150 minutes/week of aerobic exercise Low fat, low carb, high fiber diet recommended  Disposition:   FU in 6 monhs   Signed, Larae Grooms, MD  10/21/2019 2:10 PM    [facility] [address], [place], [place]  [address] Phone: [phone]; Fax: [phone]

## 2019-10-21 ENCOUNTER — Ambulatory Visit (INDEPENDENT_AMBULATORY_CARE_PROVIDER_SITE_OTHER): Payer: Medicare Other | Admitting: Interventional Cardiology

## 2019-10-21 ENCOUNTER — Encounter: Payer: Self-pay | Admitting: Interventional Cardiology

## 2019-10-21 ENCOUNTER — Other Ambulatory Visit: Payer: Self-pay

## 2019-10-21 VITALS — BP 120/70 | HR 89 | Ht 62.0 in | Wt 192.8 lb

## 2019-10-21 DIAGNOSIS — G4733 Obstructive sleep apnea (adult) (pediatric): Secondary | ICD-10-CM

## 2019-10-21 DIAGNOSIS — I4819 Other persistent atrial fibrillation: Secondary | ICD-10-CM | POA: Diagnosis not present

## 2019-10-21 DIAGNOSIS — Z7901 Long term (current) use of anticoagulants: Secondary | ICD-10-CM

## 2019-10-21 DIAGNOSIS — E669 Obesity, unspecified: Secondary | ICD-10-CM | POA: Diagnosis not present

## 2019-10-21 NOTE — Patient Instructions (Signed)
Medication Instructions:  Your physician recommends that you continue on your current medications as directed. Please refer to the Current Medication list given to you today.  *If you need a refill on your cardiac medications before your next appointment, please call your pharmacy*   Lab Work: None ordered  If you have labs (blood work) drawn today and your tests are completely normal, you will receive your results only by: Marland Kitchen MyChart Message (if you have MyChart) OR . A paper copy in the mail If you have any lab test that is abnormal or we need to change your treatment, we will call you to review the results.   Testing/Procedures: None ordered   Follow-Up: At [facility], you and your health needs are our priority.  As part of our continuing mission to provide you with exceptional heart care, we have created designated Provider Care Teams.  These Care Teams include your primary Cardiologist (physician) and Advanced Practice Providers (APPs -  Physician Assistants and Nurse Practitioners) who all work together to provide you with the care you need, when you need it.  We recommend signing up for the patient portal called "MyChart".  Sign up information is provided on this After Visit Summary.  MyChart is used to connect with patients for Virtual Visits (Telemedicine).  Patients are able to view lab/test results, encounter notes, upcoming appointments, etc.  Non-urgent messages can be sent to your provider as well.   To learn more about what you can do with MyChart, go to [email or web address].    Your next appointment:   6 month(s)  The format for your next appointment:   In Person  Provider:   You may see [name], MD or one of the following Advanced Practice Providers on your designated Care Team:    Ronie Spies, PA-C  Jacolyn Reedy, PA-C    Other Instructions None

## 2019-11-08 ENCOUNTER — Encounter (INDEPENDENT_AMBULATORY_CARE_PROVIDER_SITE_OTHER): Payer: Self-pay | Admitting: Ophthalmology

## 2019-11-08 ENCOUNTER — Other Ambulatory Visit: Payer: Self-pay

## 2019-11-08 ENCOUNTER — Ambulatory Visit (INDEPENDENT_AMBULATORY_CARE_PROVIDER_SITE_OTHER): Payer: Medicare Other | Admitting: Ophthalmology

## 2019-11-08 DIAGNOSIS — H353132 Nonexudative age-related macular degeneration, bilateral, intermediate dry stage: Secondary | ICD-10-CM

## 2019-11-08 DIAGNOSIS — H353221 Exudative age-related macular degeneration, left eye, with active choroidal neovascularization: Secondary | ICD-10-CM

## 2019-11-08 DIAGNOSIS — H35371 Puckering of macula, right eye: Secondary | ICD-10-CM

## 2019-11-08 MED ORDER — RANIBIZUMAB 0.5 MG/0.05ML IZ SOLN FOR KALEIDOSCOPE
0.5000 mg | INTRAVITREAL | Status: AC | PRN
  Administered 2019-11-08: .5 mg via INTRAVITREAL

## 2019-11-08 NOTE — Progress Notes (Signed)
11/08/2019     CHIEF COMPLAINT Patient presents for Retina Follow Up   HISTORY OF PRESENT ILLNESS: Rebecca Short is a 79 y.o. 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  Duration of 3 months.  Since onset it is stable.          Comments    3 month follow up - OCT OU, Possible Lucentis OS Patient denies change in vision and overall has no complaints.  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 (History)      Referring physician: Hulan Fess, MD [place],  [place] [id number]  HISTORICAL INFORMATION:   Selected notes from the MEDICAL RECORD NUMBER       CURRENT MEDICATIONS: No current outpatient medications on file. (Ophthalmic Drugs)   No current facility-administered medications for this visit. (Ophthalmic Drugs)   Current Outpatient Medications (Other)  Medication Sig  . calcium citrate-vitamin D (CITRACAL+D) 315-200 MG-UNIT per tablet Take 2 tablets by mouth daily.  Marland Kitchen ELIQUIS 5 MG TABS tablet TAKE 1 TABLET BY MOUTH TWICE A DAY  . EPIPEN 2-PAK 0.3 MG/0.3ML SOAJ injection Inject 0.3 mg into the muscle as needed (anaphylaxis).   . metoprolol tartrate (LOPRESSOR) 25 MG tablet TAKE 1 TABLET BY MOUTH TWICE A DAY  . metroNIDAZOLE (METROGEL) 6.16 % gel 1 APPLICATION APPLY ON THE SKIN TWICE A DAY APPLY TO FACE TWICE DAILY  . Multiple Vitamin (MULTIVITAMIN WITH MINERALS) TABS tablet Take 1 tablet by mouth daily.  . Multiple Vitamins-Minerals (PRESERVISION AREDS PO) Take 1 capsule by mouth 2 (two) times daily. Eye vitamins   No current facility-administered medications for this visit. (Other)      REVIEW OF SYSTEMS:    ALLERGIES Allergies  Allergen Reactions  . Condrolite [Glucosamine-Chondroitin-Msm] Rash  . Glucosamine Rash  . Glucosamine Forte [Nutritional Supplements] Rash  . Glucosamine Sulfate-Msm  Rash    PAST MEDICAL HISTORY Past Medical History:  Diagnosis Date  . A-fib ([place])   . Allergy   . Macular degeneration   . OSA (obstructive sleep apnea)    mild with total AHI 7.25/hr and severe during REM sleep at 35/hr now on CPAP at 6cm H2O   Past Surgical History:  Procedure Laterality Date  . REPLACEMENT TOTAL KNEE BILATERAL      FAMILY HISTORY Family History  Problem Relation Age of Onset  . Osteoporosis Mother   . Heart Problems Father   . Stroke Father   . Cancer Maternal Grandmother   . Dementia Paternal Grandmother   . Kidney failure Brother   . Suicidality Unknown   . Diabetes Brother   . Heart attack Neg Hx     SOCIAL HISTORY Social History   Tobacco Use  . Smoking status: Never Smoker  . Smokeless tobacco: Never Used  Vaping Use  . Vaping Use: Never used  Substance Use Topics  . Alcohol use: No    Alcohol/week: 0.0 standard drinks  . Drug use: No         OPHTHALMIC EXAM:  Base Eye Exam    Visual Acuity (Snellen - Linear)      Right Left   Dist cc 20/20-1 20/20-1       Tonometry (Tonopen, 8:42 AM)  Right Left   Pressure 18 18       Pupils      Pupils Dark Light Shape React APD   Right PERRL 4 3 Round Minimal None   Left PERRL 4 3 Round Minimal None       Visual Fields (Counting fingers)      Left Right    Full Full       Extraocular Movement      Right Left    Full Full       Neuro/Psych    Oriented x3: Yes   Mood/Affect: Normal       Dilation    Both eyes: 1.0% Mydriacyl, 2.5% Phenylephrine @ 8:42 AM        Slit Lamp and Fundus Exam    External Exam      Right Left   External Normal Normal       Slit Lamp Exam      Right Left   Lids/Lashes Normal Normal   Conjunctiva/Sclera White and quiet White and quiet   Cornea Clear Clear   Anterior Chamber Deep and quiet Deep and quiet   Iris Round and reactive Round and reactive   Lens Posterior chamber intraocular lens Posterior chamber intraocular lens    Anterior Vitreous Normal Normal       Fundus Exam      Right Left   Posterior Vitreous Posterior vitreous detachment Posterior vitreous detachment   Disc Normal Normal   C/D Ratio 0.2 0.2   Macula Retinal pigment epithelial mottling, Early age related macular degeneration, Pigmented atrophy, no hemorrhage, no exudates, no macular thickening, Hard drusen Retinal pigment epithelial mottling, Early age related macular degeneration, Pigmented atrophy, no hemorrhage, no exudates, no macular thickening, Soft drusen   Vessels Normal Normal   Periphery Normal Normal          IMAGING AND PROCEDURES  Imaging and Procedures for 11/08/19  OCT, Retina - OU - Both Eyes       Right Eye Quality was good. Scan locations included subfoveal. Central Foveal Thickness: 300. Progression has been stable. Findings include no SRF, no IRF, retinal drusen .   Left Eye Quality was good. Scan locations included subfoveal. Central Foveal Thickness: 292. Progression has improved. Findings include retinal drusen , no IRF, no SRF.   Notes OD, with minor epiretinal membrane of the fovea with no distortion  OS large soft drusen in the subfoveal region.  Subretinal fluid and intraretinal fluid have abated and stable at 23-month interval exam       Intravitreal Injection, Pharmacologic Agent - OS - Left Eye       Time Out 11/08/2019. 9:37 AM. Confirmed correct patient, procedure, site, and patient consented.   Anesthesia Topical anesthesia was used. Anesthetic medications included Akten 3.5%.   Procedure Preparation included 10% betadine to eyelids, Ofloxacin . A 30 gauge needle was used.   Injection:  0.5 mg ranibizumab (LUCENTIS) 0.5 MG/0.05ML intravitreal injection   NDC: 50242-080-01, Lot: [id number]   Route: Intravitreal, Site: Left Eye, Waste: 0 mg  Post-op Post injection exam found visual acuity of at least counting fingers. The patient tolerated the procedure well. There were no complications.  The patient received written and verbal post procedure care education. Post injection medications were not given.                 ASSESSMENT/PLAN:  Macular pucker, right eye Minor epiretinal tissue on the fovea by OCT examination only with no foveal distortion  will observe      ICD-10-CM   1. 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HCC)  H35.3221 OCT, Retina - OU - Both Eyes    Intravitreal Injection, Pharmacologic Agent - OS - Left Eye    ranibizumab (LUCENTIS) 0.5 MG/0.05ML intravitreal injection 0.5 mg  2. Intermediate stage nonexudative age-related macular degeneration of both eyes  H35.3132 OCT, Retina - OU - Both Eyes  3. Macular pucker, right eye  H35.371 OCT, Retina - OU - Both Eyes    1.  2.  3.  Ophthalmic Meds Ordered this visit:  Meds ordered this encounter  Medications  . ranibizumab (LUCENTIS) 0.5 MG/0.05ML intravitreal injection 0.5 mg       Return in about 3 months (around 02/08/2020) for DILATE OU, LUCENTIS 0.5 OCT, OS.  There are no Patient Instructions on file for this visit.   Explained the diagnoses, plan, and follow up with the patient and they expressed understanding.  Patient expressed understanding of the importance of proper follow up care.   Alford Highland Divine Imber M.D. Diseases & Surgery of the Retina and Vitreous [facility] 11/08/19     Abbreviations: M myopia (nearsighted); A astigmatism; H hyperopia (farsighted); P presbyopia; Mrx spectacle prescription;  CTL contact lenses; OD right eye; OS left eye; OU both eyes  XT exotropia; ET esotropia; PEK punctate epithelial keratitis; PEE punctate epithelial erosions; DES dry eye syndrome; MGD meibomian gland dysfunction; ATs artificial tears; PFAT's preservative free artificial tears; NSC nuclear sclerotic cataract; PSC posterior subcapsular cataract; ERM epi-retinal membrane; PVD posterior vitreous detachment; RD retinal  detachment; DM diabetes mellitus; DR diabetic retinopathy; NPDR non-proliferative diabetic retinopathy; PDR proliferative diabetic retinopathy; CSME clinically significant macular edema; DME diabetic macular edema; dbh dot blot hemorrhages; CWS cotton wool spot; POAG primary open angle glaucoma; C/D cup-to-disc ratio; HVF humphrey visual field; GVF goldmann visual field; OCT optical coherence tomography; IOP intraocular pressure; BRVO Branch retinal vein occlusion; CRVO central retinal vein occlusion; CRAO central retinal artery occlusion; BRAO branch retinal artery occlusion; RT retinal tear; SB scleral buckle; PPV pars plana vitrectomy; VH Vitreous hemorrhage; PRP panretinal laser photocoagulation; IVK intravitreal kenalog; VMT vitreomacular traction; MH Macular hole;  NVD neovascularization of the disc; NVE neovascularization elsewhere; AREDS age related eye disease study; ARMD age related macular degeneration; POAG primary open angle glaucoma; EBMD epithelial/anterior basement membrane dystrophy; ACIOL anterior chamber intraocular lens; IOL intraocular lens; PCIOL posterior chamber intraocular lens; Phaco/IOL phacoemulsification with intraocular lens placement; PRK photorefractive keratectomy; LASIK laser assisted in situ keratomileusis; HTN hypertension; DM diabetes mellitus; COPD chronic obstructive pulmonary disease

## 2019-11-08 NOTE — Assessment & Plan Note (Signed)
Minor epiretinal tissue on the fovea by OCT examination only with no foveal distortion will observe

## 2020-02-02 ENCOUNTER — Other Ambulatory Visit: Payer: Self-pay

## 2020-02-02 ENCOUNTER — Encounter (INDEPENDENT_AMBULATORY_CARE_PROVIDER_SITE_OTHER): Payer: Self-pay | Admitting: Ophthalmology

## 2020-02-02 ENCOUNTER — Ambulatory Visit (INDEPENDENT_AMBULATORY_CARE_PROVIDER_SITE_OTHER): Payer: Medicare Other | Admitting: Ophthalmology

## 2020-02-02 DIAGNOSIS — H353221 Exudative age-related macular degeneration, left eye, with active choroidal neovascularization: Secondary | ICD-10-CM

## 2020-02-02 DIAGNOSIS — H353132 Nonexudative age-related macular degeneration, bilateral, intermediate dry stage: Secondary | ICD-10-CM | POA: Diagnosis not present

## 2020-02-02 MED ORDER — RANIBIZUMAB 0.5 MG/0.05ML IZ SOLN FOR KALEIDOSCOPE
0.5000 mg | INTRAVITREAL | Status: AC | PRN
  Administered 2020-02-02: .5 mg via INTRAVITREAL

## 2020-02-02 NOTE — Progress Notes (Signed)
02/02/2020     CHIEF COMPLAINT Patient presents for Retina Follow Up   HISTORY OF PRESENT ILLNESS: Rebecca Short is a 79 y.o. female who presents to the clinic today for:   HPI    Retina Follow Up    Patient presents with  Wet AMD.  In both eyes.  This started 3 months ago.  Severity is mild.  Duration of 3 months.  Since onset it is gradually worsening.          Comments    3 Month AMD F/U OU, poss Lucentis OS  Pt sts she got notified through [facility] of changes OD. Pt sts she has not been aware of any [facility] changes since last visit. Pt denies ocular pain.       Last edited by Ileana Roup, COA on 02/02/2020  2:20 PM. (History)      Referring physician: Catha Gosselin, MD [address] [place],  [place] [address]  HISTORICAL INFORMATION:   Selected notes from the MEDICAL RECORD NUMBER       CURRENT MEDICATIONS: No current outpatient medications on file. (Ophthalmic Drugs)   No current facility-administered medications for this visit. (Ophthalmic Drugs)   Current Outpatient Medications (Other)  Medication Sig  . calcium citrate-vitamin D (CITRACAL+D) 315-200 MG-UNIT per tablet Take 2 tablets by mouth daily.  Marland Kitchen ELIQUIS 5 MG TABS tablet TAKE 1 TABLET BY MOUTH TWICE A DAY  . EPIPEN 2-PAK 0.3 MG/0.3ML SOAJ injection Inject 0.3 mg into the muscle as needed (anaphylaxis).   . metoprolol tartrate (LOPRESSOR) 25 MG tablet TAKE 1 TABLET BY MOUTH TWICE A DAY  . metroNIDAZOLE (METROGEL) 0.75 % gel 1 APPLICATION APPLY ON THE SKIN TWICE A DAY APPLY TO FACE TWICE DAILY  . Multiple Vitamin (MULTIVITAMIN WITH MINERALS) TABS tablet Take 1 tablet by mouth daily.  . Multiple Vitamins-Minerals (PRESERVISION AREDS PO) Take 1 capsule by mouth 2 (two) times daily. Eye vitamins   No current facility-administered medications for this visit. (Other)      REVIEW OF SYSTEMS:    ALLERGIES Allergies  Allergen Reactions  . Condrolite [Glucosamine-Chondroitin-Msm] Rash  . Glucosamine  Rash  . Glucosamine Forte [Nutritional Supplements] Rash  . Glucosamine Sulfate-Msm Rash    PAST MEDICAL HISTORY Past Medical History:  Diagnosis Date  . A-fib (HCC)   . Allergy   . Macular degeneration   . OSA (obstructive sleep apnea)    mild with total AHI 7.25/hr and severe during REM sleep at 35/hr now on CPAP at 6cm H2O   Past Surgical History:  Procedure Laterality Date  . REPLACEMENT TOTAL KNEE BILATERAL      FAMILY HISTORY Family History  Problem Relation Age of Onset  . Osteoporosis Mother   . Heart Problems Father   . Stroke Father   . Cancer Maternal Grandmother   . Dementia Paternal Grandmother   . Kidney failure Brother   . Suicidality Unknown   . Diabetes Brother   . Heart attack Neg Hx     SOCIAL HISTORY Social History   Tobacco Use  . Smoking status: Never Smoker  . Smokeless tobacco: Never Used  Vaping Use  . Vaping Use: Never used  Substance Use Topics  . Alcohol use: No    Alcohol/week: 0.0 standard drinks  . Drug use: No         OPHTHALMIC EXAM:  Base Eye Exam    Visual Acuity (ETDRS)      Right Left   Dist cc 20/20 -  1 20/20 -1   Correction: Glasses       Tonometry (Tonopen, 2:21 PM)      Right Left   Pressure 12 15       Pupils      Pupils Dark Light Shape React APD   Right PERRL 4 3 Round Slow None   Left PERRL 4 3 Round Slow None       Visual Fields (Counting fingers)      Left Right    Full Full       Extraocular Movement      Right Left    Full Full       Neuro/Psych    Oriented x3: Yes   Mood/Affect: Normal       Dilation    Both eyes: 1.0% Mydriacyl, 2.5% Phenylephrine @ 2:24 PM        Slit Lamp and Fundus Exam    External Exam      Right Left   External Normal Normal       Slit Lamp Exam      Right Left   Lids/Lashes Normal Normal   Conjunctiva/Sclera White and quiet White and quiet   Cornea Clear Clear   Anterior Chamber Deep and quiet Deep and quiet   Iris Round and reactive Round and  reactive   Lens Posterior chamber intraocular lens Posterior chamber intraocular lens   Anterior Vitreous Normal Normal       Fundus Exam      Right Left   Posterior Vitreous Posterior vitreous detachment Posterior vitreous detachment   Disc Normal Normal   C/D Ratio 0.2 0.2   Macula Retinal pigment epithelial mottling, Early age related macular degeneration, Pigmented atrophy, no hemorrhage, no exudates, no macular thickening, Hard drusen Retinal pigment epithelial mottling, Early age related macular degeneration, Pigmented atrophy, no hemorrhage, no exudates, no macular thickening, Soft drusen   Vessels Normal Normal   Periphery Normal Normal          IMAGING AND PROCEDURES  Imaging and Procedures for 02/02/20  OCT, Retina - OU - Both Eyes       Right Eye Quality was good. Scan locations included subfoveal. Central Foveal Thickness: 296. Progression has been stable. Findings include no SRF, no IRF, retinal drusen .   Left Eye Central Foveal Thickness: 288. Progression has improved. Findings include retinal drusen , no IRF, no SRF.   Notes OD, with minor epiretinal membrane of the fovea with no distortion no signs of new distortion or fluid in the right eye  OS large soft drusen in the subfoveal region.  Subretinal fluid and intraretinal fluid have abated and stable at 46-month interval exam  Repeat Lucentis injection OS today and examination in 3 to 4 months       Intravitreal Injection, Pharmacologic Agent - OS - Left Eye       Time Out 02/02/2020. 3:33 PM. Confirmed correct patient, procedure, site, and patient consented.   Anesthesia No anesthesia was used. Anesthetic medications included Akten 3.5%.   Procedure Preparation included Tobramycin 0.3%, 10% betadine to eyelids, 5% betadine to ocular surface. A 30 gauge needle was used.   Injection:  0.5 mg ranibizumab (LUCENTIS) 0.5 MG/0.05ML intravitreal injection   NDC: 50242-080-01, Lot: [id number]   Route:  Intravitreal, Site: Left Eye, Waste: 0 mg  Post-op Post injection exam found visual acuity of at least counting fingers. The patient tolerated the procedure well. There were no complications. The patient received written and verbal post procedure  care education. Post injection medications were not given.                 ASSESSMENT/PLAN:  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HCC) OS, smaller subfoveal drusen and smaller active CNVM currently now at 5-month interval.  We will repeat examination after injection today in 3 months       ICD-10-CM   1. 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HCC)  H35.3221 OCT, Retina - OU - Both Eyes    Intravitreal Injection, Pharmacologic Agent - OS - Left Eye    ranibizumab (LUCENTIS) 0.5 MG/0.05ML intravitreal injection 0.5 mg    1.  No signs of CNVM OD on clinical examination or OCT, will follow up in 6 weeks with OCT and dilated exam  2.  OS, treat today and follow-up examination only in 3 months  3.  Ophthalm no signsic Meds Ordered this visit:  Meds ordered this encounter  Medications  . ranibizumab (LUCENTIS) 0.5 MG/0.05ML intravitreal injection 0.5 mg       Return in about 6 weeks (around 03/15/2020) for dilate, OD, OCT.  Patient Instructions  Patient to report any new onset visual acuity distortions or declines in either eye.    Explained the diagnoses, plan, and follow up with the patient and they expressed understanding.  Patient expressed understanding of the importance of proper follow up care.   Alford Highland Lamya Lausch M.D. Diseases & Surgery of the Retina and Vitreous [facility] 02/02/20     Abbreviations: M myopia (nearsighted); A astigmatism; H hyperopia (farsighted); P presbyopia; Mrx spectacle prescription;  CTL contact lenses; OD right eye; OS left eye; OU both eyes  XT exotropia; ET  esotropia; PEK punctate epithelial keratitis; PEE punctate epithelial erosions; DES dry eye syndrome; MGD meibomian gland dysfunction; ATs artificial tears; PFAT's preservative free artificial tears; NSC nuclear sclerotic cataract; PSC posterior subcapsular cataract; ERM epi-retinal membrane; PVD posterior vitreous detachment; RD retinal detachment; DM diabetes mellitus; DR diabetic retinopathy; NPDR non-proliferative diabetic retinopathy; PDR proliferative diabetic retinopathy; CSME clinically significant macular edema; DME diabetic macular edema; dbh dot blot hemorrhages; CWS cotton wool spot; POAG primary open angle glaucoma; C/D cup-to-disc ratio; HVF humphrey visual field; GVF goldmann visual field; OCT optical coherence tomography; IOP intraocular pressure; BRVO Branch retinal vein occlusion; CRVO central retinal vein occlusion; CRAO central retinal artery occlusion; BRAO branch retinal artery occlusion; RT retinal tear; SB scleral buckle; PPV pars plana vitrectomy; VH Vitreous hemorrhage; PRP panretinal laser photocoagulation; IVK intravitreal kenalog; VMT vitreomacular traction; MH Macular hole;  NVD neovascularization of the disc; NVE neovascularization elsewhere; AREDS age related eye disease study; ARMD age related macular degeneration; POAG primary open angle glaucoma; EBMD epithelial/anterior basement membrane dystrophy; ACIOL anterior chamber intraocular lens; IOL intraocular lens; PCIOL posterior chamber intraocular lens; Phaco/IOL phacoemulsification with intraocular lens placement; PRK photorefractive keratectomy; LASIK laser assisted in situ keratomileusis; HTN hypertension; DM diabetes mellitus; COPD chronic obstructive pulmonary disease

## 2020-02-02 NOTE — Assessment & Plan Note (Signed)
Recent signal right eye from the for see home monitoring yet no signs of active CNVM OD today

## 2020-02-02 NOTE — Assessment & Plan Note (Signed)
OS, smaller subfoveal drusen and smaller active CNVM currently now at 66-month interval.  We will repeat examination after injection today in 3 months

## 2020-02-02 NOTE — Patient Instructions (Signed)
Patient to report any new onset visual acuity distortions or declines in either eye.

## 2020-02-07 ENCOUNTER — Encounter (INDEPENDENT_AMBULATORY_CARE_PROVIDER_SITE_OTHER): Payer: Medicare Other | Admitting: Ophthalmology

## 2020-03-18 ENCOUNTER — Other Ambulatory Visit: Payer: Self-pay | Admitting: Interventional Cardiology

## 2020-03-18 DIAGNOSIS — I4819 Other persistent atrial fibrillation: Secondary | ICD-10-CM

## 2020-03-19 ENCOUNTER — Encounter (INDEPENDENT_AMBULATORY_CARE_PROVIDER_SITE_OTHER): Payer: Self-pay | Admitting: Ophthalmology

## 2020-03-19 ENCOUNTER — Other Ambulatory Visit: Payer: Self-pay

## 2020-03-19 ENCOUNTER — Ambulatory Visit (INDEPENDENT_AMBULATORY_CARE_PROVIDER_SITE_OTHER): Payer: Medicare Other | Admitting: Ophthalmology

## 2020-03-19 DIAGNOSIS — H353221 Exudative age-related macular degeneration, left eye, with active choroidal neovascularization: Secondary | ICD-10-CM | POA: Diagnosis not present

## 2020-03-19 DIAGNOSIS — H35371 Puckering of macula, right eye: Secondary | ICD-10-CM

## 2020-03-19 DIAGNOSIS — H353132 Nonexudative age-related macular degeneration, bilateral, intermediate dry stage: Secondary | ICD-10-CM | POA: Diagnosis not present

## 2020-03-19 NOTE — Patient Instructions (Signed)
Instructed to contact the office promptly for new onset visual acuity decline or distortion 

## 2020-03-19 NOTE — Telephone Encounter (Signed)
Age 79, weight 87.5kg, SCr 0.74 on 12/27/18, needs updated labs. Sees Dr Eldridge Dace for follow up in 1 month. Sent in 1 month refill and added lab order for same day as OV

## 2020-03-19 NOTE — Progress Notes (Signed)
03/19/2020     CHIEF COMPLAINT Patient presents for Retina Follow Up   HISTORY OF PRESENT ILLNESS: Rebecca Short is a 79 y.o. 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  Severity is moderate.  Duration of 6 weeks.  Since onset it is stable.  I, the attending physician,  performed the HPI with the patient and updated documentation appropriately.          Comments    6 Week ERM f\u OD. OCT  Pt states no issues with OD.        Last edited by Elyse Jarvis on 03/19/2020  1:39 PM. (History)      Referring physician: Catha Gosselin, MD [address] [place],  [place] [address]  HISTORICAL INFORMATION:   Selected notes from the MEDICAL RECORD NUMBER       CURRENT MEDICATIONS: No current outpatient medications on file. (Ophthalmic Drugs)   No current facility-administered medications for this visit. (Ophthalmic Drugs)   Current Outpatient Medications (Other)  Medication Sig  . calcium citrate-vitamin D (CITRACAL+D) 315-200 MG-UNIT per tablet Take 2 tablets by mouth daily.  Marland Kitchen ELIQUIS 5 MG TABS tablet TAKE 1 TABLET BY MOUTH TWICE A DAY  . EPIPEN 2-PAK 0.3 MG/0.3ML SOAJ injection Inject 0.3 mg into the muscle as needed (anaphylaxis).   . metoprolol tartrate (LOPRESSOR) 25 MG tablet TAKE 1 TABLET BY MOUTH TWICE A DAY  . metroNIDAZOLE (METROGEL) 0.75 % gel 1 APPLICATION APPLY ON THE SKIN TWICE A DAY APPLY TO FACE TWICE DAILY  . Multiple Vitamin (MULTIVITAMIN WITH MINERALS) TABS tablet Take 1 tablet by mouth daily.  . Multiple Vitamins-Minerals (PRESERVISION AREDS PO) Take 1 capsule by mouth 2 (two) times daily. Eye vitamins   No current facility-administered medications for this visit. (Other)      REVIEW OF SYSTEMS:    ALLERGIES Allergies  Allergen Reactions  . Condrolite [Glucosamine-Chondroitin-Msm] Rash  . Glucosamine Rash  . Glucosamine Forte [Nutritional Supplements] Rash  . Glucosamine  Sulfate-Msm Rash    PAST MEDICAL HISTORY Past Medical History:  Diagnosis Date  . A-fib (HCC)   . Allergy   . Macular degeneration   . OSA (obstructive sleep apnea)    mild with total AHI 7.25/hr and severe during REM sleep at 35/hr now on CPAP at 6cm H2O   Past Surgical History:  Procedure Laterality Date  . REPLACEMENT TOTAL KNEE BILATERAL      FAMILY HISTORY Family History  Problem Relation Age of Onset  . Osteoporosis Mother   . Heart Problems Father   . Stroke Father   . Cancer Maternal Grandmother   . Dementia Paternal Grandmother   . Kidney failure Brother   . Suicidality Unknown   . Diabetes Brother   . Heart attack Neg Hx     SOCIAL HISTORY Social History   Tobacco Use  . Smoking status: Never Smoker  . Smokeless tobacco: Never Used  Vaping Use  . Vaping Use: Never used  Substance Use Topics  . Alcohol use: No    Alcohol/week: 0.0 standard drinks  . Drug use: No         OPHTHALMIC EXAM:  Base Eye Exam    Visual Acuity (Snellen - Linear)      Right Left   Dist cc 20/20 -1 20/25 -2   Correction: Glasses       Tonometry (Tonopen, 1:38  PM)      Right Left   Pressure 13 13       Pupils      Pupils Dark Light Shape React APD   Right PERRL 4 3 Round Brisk None   Left PERRL 4 3 Round Brisk None       Visual Fields (Counting fingers)      Left Right    Full Full       Neuro/Psych    Oriented x3: Yes   Mood/Affect: Normal       Dilation    Right eye: 1.0% Mydriacyl, 2.5% Phenylephrine @ 1:38 PM        Slit Lamp and Fundus Exam    External Exam      Right Left   External Normal Normal       Slit Lamp Exam      Right Left   Lids/Lashes Normal Normal   Conjunctiva/Sclera White and quiet White and quiet   Cornea Clear Clear   Anterior Chamber Deep and quiet Deep and quiet   Iris Round and reactive Round and reactive   Lens Posterior chamber intraocular lens Posterior chamber intraocular lens   Anterior Vitreous Normal Normal         Fundus Exam      Right Left   Posterior Vitreous Posterior vitreous detachment    Disc Normal    C/D Ratio 0.2    Macula Retinal pigment epithelial mottling, Early age related macular degeneration, Pigmented atrophy, no hemorrhage, no exudates, no macular thickening, Hard drusen    Vessels Normal    Periphery Normal           IMAGING AND PROCEDURES  Imaging and Procedures for 03/19/20  OCT, Retina - OU - Both Eyes       Right Eye Quality was good. Scan locations included subfoveal. Central Foveal Thickness: 300. Progression has been stable. Findings include no IRF, no SRF, retinal drusen .   Left Eye Quality was good. Scan locations included subfoveal. Central Foveal Thickness: 290. Progression has been stable. Findings include no IRF, retinal drusen , no SRF.   Notes OS, at 7 weeks status post intravitreal ranibizumab, condition and macula improved and stable  OD no signs of active CNVM                ASSESSMENT/PLAN:  Intermediate stage nonexudative age-related macular degeneration of both eyes The nature of age--related macular degeneration was discussed with the patient as well as the distinction between dry and wet types. Checking an Amsler Grid daily with advice to return immediately should a distortion develop, was given to the patient. The patient 's smoking status now and in the past was determined and advice based on the AREDS study was provided regarding the consumption of antioxidant supplements. AREDS 2 vitamin formulation was recommended. Consumption of dark leafy vegetables and fresh fruits of various colors was recommended. Treatment modalities for wet macular degeneration particularly the use of intravitreal injections of anti-blood vessel growth factors was discussed with the patient. Avastin, Lucentis, and Eylea are the available options. On occasion, therapy includes the use of photodynamic therapy and thermal laser. Stressed to the patient do  not rub eyes.  Patient was advised to check Amsler Grid daily and return immediately if changes are noted. Instructions on using the grid were given to the patient. All patient questions were answered.  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HCC) OS today currently at 6 weeks  post ranibizumab, follow-up as scheduled in 6 weeks for the left eye examination      ICD-10-CM   1. 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HCC)  H35.3221   2. Macular pucker, right eye  H35.371 OCT, Retina - OU - Both Eyes  3. Intermediate stage nonexudative age-related macular degeneration of both eyes  H35.3132     1.  OD, no signs of active CNVM, will continue to monitor dilate OU next  2.  OS, follow-up as scheduled in 6 weeks likely Lucentis 0.5 injection  3.  Ophthalmic Meds Ordered this visit:  No orders of the defined types were placed in this encounter.      Return in about 6 weeks (around 04/30/2020) for DILATE OU, As scheduled, LUCENTIS 0.5 OCT, OS.  Patient Instructions  Instructed to contact the office promptly for new onset visual acuity decline or distortion    Explained the diagnoses, plan, and follow up with the patient and they expressed understanding.  Patient expressed understanding of the importance of proper follow up care.   Alford Highland Aryia Delira M.D. Diseases & Surgery of the Retina and Vitreous [facility] 03/19/20     Abbreviations: M myopia (nearsighted); A astigmatism; H hyperopia (farsighted); P presbyopia; Mrx spectacle prescription;  CTL contact lenses; OD right eye; OS left eye; OU both eyes  XT exotropia; ET esotropia; PEK punctate epithelial keratitis; PEE punctate epithelial erosions; DES dry eye syndrome; MGD meibomian gland dysfunction; ATs artificial tears; PFAT's preservative free artificial tears; NSC nuclear sclerotic cataract; PSC  posterior subcapsular cataract; ERM epi-retinal membrane; PVD posterior vitreous detachment; RD retinal detachment; DM diabetes mellitus; DR diabetic retinopathy; NPDR non-proliferative diabetic retinopathy; PDR proliferative diabetic retinopathy; CSME clinically significant macular edema; DME diabetic macular edema; dbh dot blot hemorrhages; CWS cotton wool spot; POAG primary open angle glaucoma; C/D cup-to-disc ratio; HVF humphrey visual field; GVF goldmann visual field; OCT optical coherence tomography; IOP intraocular pressure; BRVO Branch retinal vein occlusion; CRVO central retinal vein occlusion; CRAO central retinal artery occlusion; BRAO branch retinal artery occlusion; RT retinal tear; SB scleral buckle; PPV pars plana vitrectomy; VH Vitreous hemorrhage; PRP panretinal laser photocoagulation; IVK intravitreal kenalog; VMT vitreomacular traction; MH Macular hole;  NVD neovascularization of the disc; NVE neovascularization elsewhere; AREDS age related eye disease study; ARMD age related macular degeneration; POAG primary open angle glaucoma; EBMD epithelial/anterior basement membrane dystrophy; ACIOL anterior chamber intraocular lens; IOL intraocular lens; PCIOL posterior chamber intraocular lens; Phaco/IOL phacoemulsification with intraocular lens placement; PRK photorefractive keratectomy; LASIK laser assisted in situ keratomileusis; HTN hypertension; DM diabetes mellitus; COPD chronic obstructive pulmonary disease

## 2020-03-19 NOTE — Assessment & Plan Note (Signed)
OS today currently at 6 weeks post ranibizumab, follow-up as scheduled in 6 weeks for the left eye examination

## 2020-03-19 NOTE — Assessment & Plan Note (Signed)

## 2020-03-27 ENCOUNTER — Telehealth: Payer: Self-pay | Admitting: Cardiology

## 2020-03-27 NOTE — Telephone Encounter (Signed)
Return call and lmtcb Advised patient to call her dme to to see if her cpap has to completely die before she can get a replacement unit.

## 2020-03-27 NOTE — Telephone Encounter (Signed)
Patient calling because she got a message on her cpap machine that states, "the motor life is exceeded". She wants to know what to do about this. Please advise.

## 2020-03-28 LAB — HM DEXA SCAN

## 2020-04-07 ENCOUNTER — Other Ambulatory Visit: Payer: Self-pay | Admitting: Interventional Cardiology

## 2020-04-30 ENCOUNTER — Other Ambulatory Visit: Payer: Self-pay

## 2020-04-30 ENCOUNTER — Ambulatory Visit (INDEPENDENT_AMBULATORY_CARE_PROVIDER_SITE_OTHER): Payer: Medicare Other | Admitting: Ophthalmology

## 2020-04-30 ENCOUNTER — Encounter (INDEPENDENT_AMBULATORY_CARE_PROVIDER_SITE_OTHER): Payer: Self-pay | Admitting: Ophthalmology

## 2020-04-30 DIAGNOSIS — H43813 Vitreous degeneration, bilateral: Secondary | ICD-10-CM

## 2020-04-30 DIAGNOSIS — G4733 Obstructive sleep apnea (adult) (pediatric): Secondary | ICD-10-CM

## 2020-04-30 DIAGNOSIS — H353132 Nonexudative age-related macular degeneration, bilateral, intermediate dry stage: Secondary | ICD-10-CM

## 2020-04-30 DIAGNOSIS — H353221 Exudative age-related macular degeneration, left eye, with active choroidal neovascularization: Secondary | ICD-10-CM

## 2020-04-30 MED ORDER — RANIBIZUMAB 0.5 MG/0.05ML IZ SOSY
0.5000 mg | PREFILLED_SYRINGE | INTRAVITREAL | Status: AC | PRN
  Administered 2020-04-30: .5 mg via INTRAVITREAL

## 2020-04-30 NOTE — Assessment & Plan Note (Signed)

## 2020-04-30 NOTE — Assessment & Plan Note (Signed)
Patient is compliant with use of CPAP, no reminded the patient the importance of continued nighttime hypoxia treatment with CPAP in my opinion to minimize the risk of progression of macular degeneration and CNVM

## 2020-04-30 NOTE — Assessment & Plan Note (Signed)
Today OS 12-week follow-up post intravitreal Lucentis, no signs of active disease.  History of recurrences.  We'll repeat injection intravitreal Lucentis today and examination again in 3 months.  History of distance to other treatments, Avastin

## 2020-04-30 NOTE — Progress Notes (Signed)
04/30/2020     CHIEF COMPLAINT Patient presents for Retina Follow Up   HISTORY OF PRESENT ILLNESS: Rebecca Short is a 79 y.o. 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  This started 6 weeks ago.  Severity is mild.  Duration of 6 weeks.  Since onset it is stable.          Comments    6 Week AMD F/U OU, poss Lucentis 0.5 OS  Pt denies noticeable changes to [place] OU since last visit. Pt denies ocular pain, flashes of light, or floaters OU.         Last edited by Ileana Roup, COA on 04/30/2020  1:33 PM. (History)      Referring physician: Catha Gosselin, MD [address] [place],  [place] [address]  HISTORICAL INFORMATION:   Selected notes from the MEDICAL RECORD NUMBER       CURRENT MEDICATIONS: No current outpatient medications on file. (Ophthalmic Drugs)   No current facility-administered medications for this visit. (Ophthalmic Drugs)   Current Outpatient Medications (Other)  Medication Sig  . calcium citrate-vitamin D (CITRACAL+D) 315-200 MG-UNIT per tablet Take 2 tablets by mouth daily.  Marland Kitchen ELIQUIS 5 MG TABS tablet TAKE 1 TABLET BY MOUTH TWICE A DAY  . EPIPEN 2-PAK 0.3 MG/0.3ML SOAJ injection Inject 0.3 mg into the muscle as needed (anaphylaxis).   . metoprolol tartrate (LOPRESSOR) 25 MG tablet TAKE 1 TABLET BY MOUTH TWICE A DAY  . metroNIDAZOLE (METROGEL) 0.75 % gel 1 APPLICATION APPLY ON THE SKIN TWICE A DAY APPLY TO FACE TWICE DAILY  . Multiple Vitamin (MULTIVITAMIN WITH MINERALS) TABS tablet Take 1 tablet by mouth daily.  . Multiple Vitamins-Minerals (PRESERVISION AREDS PO) Take 1 capsule by mouth 2 (two) times daily. Eye vitamins   No current facility-administered medications for this visit. (Other)      REVIEW OF SYSTEMS:    ALLERGIES Allergies  Allergen Reactions  . Condrolite [Glucosamine-Chondroitin-Msm] Rash  . Glucosamine Rash  . Glucosamine Forte [Nutritional  Supplements] Rash  . Glucosamine Sulfate-Msm Rash    PAST MEDICAL HISTORY Past Medical History:  Diagnosis Date  . A-fib (HCC)   . Allergy   . Macular degeneration   . OSA (obstructive sleep apnea)    mild with total AHI 7.25/hr and severe during REM sleep at 35/hr now on CPAP at 6cm H2O   Past Surgical History:  Procedure Laterality Date  . REPLACEMENT TOTAL KNEE BILATERAL      FAMILY HISTORY Family History  Problem Relation Age of Onset  . Osteoporosis Mother   . Heart Problems Father   . Stroke Father   . Cancer Maternal Grandmother   . Dementia Paternal Grandmother   . Kidney failure Brother   . Suicidality Unknown   . Diabetes Brother   . Heart attack Neg Hx     SOCIAL HISTORY Social History   Tobacco Use  . Smoking status: Never Smoker  . Smokeless tobacco: Never Used  Vaping Use  . Vaping Use: Never used  Substance Use Topics  . Alcohol use: No    Alcohol/week: 0.0 standard drinks  . Drug use: No         OPHTHALMIC EXAM:  Base Eye Exam    Visual Acuity (ETDRS)      Right Left   Dist cc 20/20 -1 20/25 +1   Correction:  Glasses       Tonometry (Tonopen, 1:33 PM)      Right Left   Pressure 17 14       Pupils      Pupils Dark Light Shape React APD   Right PERRL 4 3 Round Brisk None   Left PERRL 4 3 Round Brisk None       Visual Fields (Counting fingers)      Left Right    Full Full       Extraocular Movement      Right Left    Full Full       Neuro/Psych    Oriented x3: Yes   Mood/Affect: Normal       Dilation    Both eyes: 1.0% Mydriacyl, 2.5% Phenylephrine @ 1:36 PM        Slit Lamp and Fundus Exam    External Exam      Right Left   External Normal Normal       Slit Lamp Exam      Right Left   Lids/Lashes Normal Normal   Conjunctiva/Sclera White and quiet White and quiet   Cornea Clear Clear   Anterior Chamber Deep and quiet Deep and quiet   Iris Round and reactive Round and reactive   Lens Posterior chamber  intraocular lens Posterior chamber intraocular lens   Anterior Vitreous Normal Normal       Fundus Exam      Right Left   Posterior Vitreous Posterior vitreous detachment Posterior vitreous detachment   Disc Normal Normal   C/D Ratio 0.55 0.25   Macula Retinal pigment epithelial mottling, Early age related macular degeneration, no hemorrhage, no exudates, no macular thickening, Hard drusen Retinal pigment epithelial mottling, Early age related macular degeneration, Pigmented atrophy, no hemorrhage, no exudates, no macular thickening, Soft drusen   Vessels Normal Normal   Periphery Normal Normal          IMAGING AND PROCEDURES  Imaging and Procedures for 04/30/20  OCT, Retina - OU - Both Eyes       Right Eye Quality was good. Scan locations included subfoveal. Central Foveal Thickness: 304. Progression has improved. Findings include abnormal foveal contour, retinal drusen , no SRF, no IRF.   Left Eye Quality was good. Scan locations included subfoveal. Central Foveal Thickness: 299. Progression has improved. Findings include no IRF, no SRF, abnormal foveal contour.   Notes OS, remained stable at 12-week interval with history of recurrences, will repeat intravitreal Lucentis today and examination again in 3 months       Intravitreal Injection, Pharmacologic Agent - OS - Left Eye       Time Out 04/30/2020. 3:01 PM. Confirmed correct patient, procedure, site, and patient consented.   Anesthesia No anesthesia was used. Anesthetic medications included Akten 3.5%.   Procedure Preparation included Tobramycin 0.3%, 10% betadine to eyelids, 5% betadine to ocular surface, Ofloxacin . A 30 gauge needle was used.   Injection:  0.5 mg Ranibizumab SOSY (LUCENTIS) 0.5mg /0.63ml prefilled syr   NDC: [id number], Lot: [id number]   Route: Intravitreal, Site: Left Eye, Waste: 0 mg  Post-op Post injection exam found visual acuity of at least counting fingers. The patient tolerated the  procedure well. There were no complications. The patient received written and verbal post procedure care education. Post injection medications were not given.                 ASSESSMENT/PLAN:  Exudative age-related macular degeneration of left eye with  active choroidal neovascularization (HCC) Today OS 12-week follow-up post intravitreal Lucentis, no signs of active disease.  History of recurrences.  We'll repeat injection intravitreal Lucentis today and examination again in 3 months.  History of distance to other treatments, Avastin  Posterior vitreous detachment of both eyes   The nature of posterior vitreous detachment was discussed with the patient as well as its physiology, its age prevalence, and its possible implication regarding retinal breaks and detachment.  An informational brochure was given to the patient.  All the patient's questions were answered.  The patient was asked to return if new or different flashes or floaters develops.   Patient was instructed to contact office immediately if any changes were noticed. I explained to the patient that vitreous inside the eye is similar to jello inside a bowl. As the jello melts it can start to pull away from the bowl, similarly the vitreous throughout our lives can begin to pull away from the retina. That process is called a posterior vitreous detachment. In some cases, the vitreous can tug hard enough on the retina to form a retinal tear. I discussed with the patient the signs and symptoms of a retinal detachment.  Do not rub the eye.  OSA (obstructive sleep apnea) Patient is compliant with use of CPAP, no reminded the patient the importance of continued nighttime hypoxia treatment with CPAP in my opinion to minimize the risk of progression of macular degeneration and CNVM      ICD-10-CM   1. 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HCC)  H35.3221 OCT, Retina - OU - Both Eyes     Intravitreal Injection, Pharmacologic Agent - OS - Left Eye    Ranibizumab SOSY 0.5 mg  2. Intermediate stage nonexudative age-related macular degeneration of both eyes  H35.3132 OCT, Retina - OU - Both Eyes  3. Posterior vitreous detachment of both eyes  H43.813   4. OSA (obstructive sleep apnea)  G47.33     1.  Repeat intravitreal Lucentis OS today  2.  Follow-up in 3 months dilate OU  3.  Ophthalmic Meds Ordered this visit:  Meds ordered this encounter  Medications  . Ranibizumab SOSY 0.5 mg       Return in about 3 months (around 07/29/2020) for DILATE OU, LUCENTIS 0.5 OCT, OS.  There are no Patient Instructions on file for this visit.   Explained the diagnoses, plan, and follow up with the patient and they expressed understanding.  Patient expressed understanding of the importance of proper follow up care.   Alford Highland Sheletha Bow M.D. Diseases & Surgery of the Retina and Vitreous [facility] 04/30/20     Abbreviations: M myopia (nearsighted); A astigmatism; H hyperopia (farsighted); P presbyopia; Mrx spectacle prescription;  CTL contact lenses; OD right eye; OS left eye; OU both eyes  XT exotropia; ET esotropia; PEK punctate epithelial keratitis; PEE punctate epithelial erosions; DES dry eye syndrome; MGD meibomian gland dysfunction; ATs artificial tears; PFAT's preservative free artificial tears; NSC nuclear sclerotic cataract; PSC posterior subcapsular cataract; ERM epi-retinal membrane; PVD posterior vitreous detachment; RD retinal detachment; DM diabetes mellitus; DR diabetic retinopathy; NPDR non-proliferative diabetic retinopathy; PDR proliferative diabetic retinopathy; CSME clinically significant macular edema; DME diabetic macular edema; dbh dot blot hemorrhages; CWS cotton wool spot; POAG primary open angle glaucoma; C/D cup-to-disc ratio; HVF humphrey visual field; GVF goldmann visual field; OCT optical coherence tomography; IOP intraocular pressure; BRVO  Branch retinal vein occlusion; CRVO central retinal  vein occlusion; CRAO central retinal artery occlusion; BRAO branch retinal artery occlusion; RT retinal tear; SB scleral buckle; PPV pars plana vitrectomy; VH Vitreous hemorrhage; PRP panretinal laser photocoagulation; IVK intravitreal kenalog; VMT vitreomacular traction; MH Macular hole;  NVD neovascularization of the disc; NVE neovascularization elsewhere; AREDS age related eye disease study; ARMD age related macular degeneration; POAG primary open angle glaucoma; EBMD epithelial/anterior basement membrane dystrophy; ACIOL anterior chamber intraocular lens; IOL intraocular lens; PCIOL posterior chamber intraocular lens; Phaco/IOL phacoemulsification with intraocular lens placement; [place] photorefractive keratectomy; LASIK laser assisted in situ keratomileusis; HTN hypertension; DM diabetes mellitus; COPD chronic obstructive pulmonary disease

## 2020-05-02 NOTE — Progress Notes (Signed)
Cardiology Office Note   Date:  05/03/2020   ID:  Rebecca Short 1940/09/30, MRN [id number]  PCP:  Catha Gosselin, MD    No chief complaint on file.  PAF  Wt Readings from Last 3 Encounters:  05/03/20 187 lb 9.6 oz (85.1 kg)  10/21/19 192 lb 12.8 oz (87.5 kg)  04/04/19 194 lb 12.8 oz (88.4 kg)       History of Present Illness: Rebecca Short is a 79 y.o. female   with a h/o PAF. She has chronic back pain which has limited her exercise. In the past, Managing hydration and stress have helped minimize her palpitations.   In 3/19, it was noted: "She is spending more time in atrial fibrillation now. It will likely be more difficult to get her out of atrial fibrillation. Given her lack of symptoms, would not pursue antiarrhythmic drug or cardioversion at this time.  She has been treated for sleep apnea with CPAP.  [facility] She was at higher elevation and she did well. She did not ned any extra metoprolol.  The patientdoes nothave symptoms concerning for COVID-19 infection (fever, chills, cough, or new shortness of breath).   She had her COVID vaccines.  THe day After the second pfizer shot, she felt some irregularity for 30 minutes and it resolved.    She got her booster and flu shot on the same day.  Had some brief palpitations but this resolved.    Denies : Chest pain.  Leg edema. Nitroglycerin use. Orthopnea.  Paroxysmal nocturnal dyspnea. Shortness of breath. Syncope.   Rare palpitations and dizziness.   She has been walking some and feels well.   No bleeding problems.  Eating better.  Trying to lose weight.   Uses treadmill and weight regularly.   Past Medical History:  Diagnosis Date  . A-fib (HCC)   . Allergy   . Macular degeneration   . OSA (obstructive sleep apnea)    mild with total AHI 7.25/hr and severe during REM sleep at 35/hr now on CPAP at 6cm H2O    Past Surgical History:  Procedure  Laterality Date  . REPLACEMENT TOTAL KNEE BILATERAL       Current Outpatient Medications  Medication Sig Dispense Refill  . calcium citrate-vitamin D (CITRACAL+D) 315-200 MG-UNIT per tablet Take 2 tablets by mouth daily.    Marland Kitchen ELIQUIS 5 MG TABS tablet TAKE 1 TABLET BY MOUTH TWICE A DAY 60 tablet 1  . EPIPEN 2-PAK 0.3 MG/0.3ML SOAJ injection Inject 0.3 mg into the muscle as needed (anaphylaxis).   0  . metoprolol tartrate (LOPRESSOR) 25 MG tablet TAKE 1 TABLET BY MOUTH TWICE A DAY 180 tablet 1  . metroNIDAZOLE (METROGEL) 0.75 % gel 1 APPLICATION APPLY ON THE SKIN TWICE A DAY APPLY TO FACE TWICE DAILY  2  . Multiple Vitamin (MULTIVITAMIN WITH MINERALS) TABS tablet Take 1 tablet by mouth daily.    . Multiple Vitamins-Minerals (PRESERVISION AREDS PO) Take 1 capsule by mouth 2 (two) times daily. Eye vitamins     No current facility-administered medications for this visit.    Allergies:   Condrolite [glucosamine-chondroitin-msm], Glucosamine, Glucosamine forte [nutritional supplements], and Glucosamine sulfate-msm    Social History:  The patient  reports that she has never smoked. She has never used smokeless tobacco. She reports that she does not drink alcohol and does not use drugs.   Family History:  The patient's family history includes Cancer in  her maternal grandmother; Dementia in her paternal grandmother; Diabetes in her brother; Heart Problems in her father; Kidney failure in her brother; Osteoporosis in her mother; Stroke in her father; Suicidality in an other family member.    ROS:  Please see the history of present illness.   Otherwise, review of systems are positive for occasional edema.   All other systems are reviewed and negative.    PHYSICAL EXAM: VS:  BP 118/76   Pulse 76   Ht 5\' 2"  (1.575 m)   Wt 187 lb 9.6 oz (85.1 kg)   SpO2 96%   BMI 34.31 kg/m  , BMI Body mass index is 34.31 kg/m. GEN: Well nourished, well developed, in no acute distress  HEENT: normal  Neck:  no JVD, carotid bruits, or masses Cardiac: irregularly irregular; no murmurs, rubs, or gallops,; mild leg edema bilaterally Respiratory:  clear to auscultation bilaterally, normal work of breathing GI: soft, nontender, nondistended, + BS MS: no deformity or atrophy  Skin: warm and dry, no rash Neuro:  Strength and sensation are intact Psych: euthymic mood, full affect   EKG:   The ekg ordered today demonstrates AFib, controlled V rate   Recent Labs: No results found for requested labs within last 8760 hours.   Lipid Panel    Component Value Date/Time   CHOL 145 08/19/2017 0816   TRIG 101 08/19/2017 0816   HDL 47 08/19/2017 0816   CHOLHDL 3.1 08/19/2017 0816   LDLCALC 78 08/19/2017 0816     Other studies Reviewed: Additional studies/ records that were reviewed today with results demonstrating: LDL 82 in 11/21.   ASSESSMENT AND PLAN:  1. Atrial fibrillation: No sx.  Eliquis for stroke prevention.  Rate controlled.  2. Anticoagulated: No bleeding problems.  Stroke prevention with Eliquis.  3. OSA: Using CPAP.  Follows with Dr. 12/21.  4. Morbid obesity: Lost weight.  Portion control.  Regular exercise.  5. Getting injections in the eyes for macular degeneration.    Current medicines are reviewed at length with the patient today.  The patient concerns regarding her medicines were addressed.  The following changes have been made:  No change  Labs/ tests ordered today include:  No orders of the defined types were placed in this encounter.   Recommend 150 minutes/week of aerobic exercise Low fat, low carb, high fiber diet recommended  Disposition:   FU in 9 months   Signed, Mayford Knife, MD  05/03/2020 11:21 AM    [facility] [address] [place], [place], [place]  [place] Phone: [phone]; Fax: [phone]

## 2020-05-03 ENCOUNTER — Other Ambulatory Visit: Payer: Self-pay

## 2020-05-03 ENCOUNTER — Ambulatory Visit (INDEPENDENT_AMBULATORY_CARE_PROVIDER_SITE_OTHER): Payer: Medicare Other | Admitting: Interventional Cardiology

## 2020-05-03 ENCOUNTER — Encounter: Payer: Self-pay | Admitting: Interventional Cardiology

## 2020-05-03 ENCOUNTER — Other Ambulatory Visit: Payer: Medicare Other

## 2020-05-03 VITALS — BP 118/76 | HR 76 | Ht 62.0 in | Wt 187.6 lb

## 2020-05-03 DIAGNOSIS — Z7901 Long term (current) use of anticoagulants: Secondary | ICD-10-CM | POA: Diagnosis not present

## 2020-05-03 DIAGNOSIS — G4733 Obstructive sleep apnea (adult) (pediatric): Secondary | ICD-10-CM | POA: Diagnosis not present

## 2020-05-03 DIAGNOSIS — E669 Obesity, unspecified: Secondary | ICD-10-CM | POA: Diagnosis not present

## 2020-05-03 DIAGNOSIS — I4819 Other persistent atrial fibrillation: Secondary | ICD-10-CM

## 2020-05-03 NOTE — Patient Instructions (Signed)
Medication Instructions:  Your physician recommends that you continue on your current medications as directed. Please refer to the Current Medication list given to you today.  *If you need a refill on your cardiac medications before your next appointment, please call your pharmacy*   Lab Work: None  If you have labs (blood work) drawn today and your tests are completely normal, you will receive your results only by: . MyChart Message (if you have MyChart) OR . A paper copy in the mail If you have any lab test that is abnormal or we need to change your treatment, we will call you to review the results.   Testing/Procedures: None   Follow-Up: At [facility], you and your health needs are our priority.  As part of our continuing mission to provide you with exceptional heart care, we have created designated Provider Care Teams.  These Care Teams include your primary Cardiologist (physician) and Advanced Practice Providers (APPs -  Physician Assistants and Nurse Practitioners) who all work together to provide you with the care you need, when you need it.  We recommend signing up for the patient portal called "MyChart".  Sign up information is provided on this After Visit Summary.  MyChart is used to connect with patients for Virtual Visits (Telemedicine).  Patients are able to view lab/test results, encounter notes, upcoming appointments, etc.  Non-urgent messages can be sent to your provider as well.   To learn more about what you can do with MyChart, go to [email or web address].    Your next appointment:   9 month(s)  The format for your next appointment:   In Person  Provider:   You may see Jayadeep Varanasi, MD or one of the following Advanced Practice Providers on your designated Care Team:    Dayna Dunn, PA-C  Michele Lenze, PA-C    Other Instructions None  

## 2020-05-07 ENCOUNTER — Ambulatory Visit (INDEPENDENT_AMBULATORY_CARE_PROVIDER_SITE_OTHER): Payer: Medicare Other | Admitting: Cardiology

## 2020-05-07 ENCOUNTER — Other Ambulatory Visit: Payer: Self-pay

## 2020-05-07 ENCOUNTER — Telehealth: Payer: Self-pay | Admitting: *Deleted

## 2020-05-07 ENCOUNTER — Encounter: Payer: Self-pay | Admitting: Cardiology

## 2020-05-07 ENCOUNTER — Other Ambulatory Visit: Payer: Medicare Other

## 2020-05-07 VITALS — BP 110/70 | HR 72 | Ht 62.0 in | Wt 186.4 lb

## 2020-05-07 DIAGNOSIS — G4733 Obstructive sleep apnea (adult) (pediatric): Secondary | ICD-10-CM

## 2020-05-07 DIAGNOSIS — I480 Paroxysmal atrial fibrillation: Secondary | ICD-10-CM | POA: Diagnosis not present

## 2020-05-07 DIAGNOSIS — E669 Obesity, unspecified: Secondary | ICD-10-CM

## 2020-05-07 NOTE — Progress Notes (Addendum)
Date:  05/07/2020   ID:  Rebecca Short, DOB [birth year], MRN [id number]  PCP:  Catha Gosselin, MD  Sleep Medicine:  Armanda Magic, MD Electrophysiologist:  None   Chief Complaint:  OSA  History of Present Illness:    IZZAH Short is a 79 y.o. 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  She is here today for followup and is doing well.  She denies any chest pain or pressure, SOB, DOE, PND, orthopnea, LE edema, dizziness, palpitations or syncope. She is compliant with her meds and is tolerating meds with no SE.    She is doing well with her CPAP device and thinks that she has gotten used to it.  She tolerates the mask and feels the pressure is adequate.  Since going on CPAP she feels rested in the am and has no significant daytime sleepiness although occasionally will have to take a nap.  She denies any significant mouth or nasal dryness or nasal congestion.  She does not think that she snores.  She tells me that her device says that her battery is end of life on her device and needs a new device.    Prior CV studies:   The following studies were reviewed today:  PAP compliance download  Past Medical History:  Diagnosis Date  . A-fib (HCC)   . Allergy   . Macular degeneration   . OSA (obstructive sleep apnea)    mild with total AHI 7.25/hr and severe during REM sleep at 35/hr now on CPAP at 6cm H2O   Past Surgical History:  Procedure Laterality Date  . REPLACEMENT TOTAL KNEE BILATERAL       Current Meds  Medication Sig  . calcium citrate-vitamin D (CITRACAL+D) 315-200 MG-UNIT per tablet Take 2 tablets by mouth daily.  Marland Kitchen ELIQUIS 5 MG TABS tablet TAKE 1 TABLET BY MOUTH TWICE A DAY  . EPIPEN 2-PAK 0.3 MG/0.3ML SOAJ injection Inject 0.3 mg into the muscle as needed (anaphylaxis).   . metoprolol tartrate (LOPRESSOR) 25 MG tablet TAKE 1 TABLET BY MOUTH TWICE A DAY  . metroNIDAZOLE  (METROGEL) 0.75 % gel 1 APPLICATION APPLY ON THE SKIN TWICE A DAY APPLY TO FACE TWICE DAILY  . Multiple Vitamin (MULTIVITAMIN WITH MINERALS) TABS tablet Take 1 tablet by mouth daily.  . Multiple Vitamins-Minerals (PRESERVISION AREDS PO) Take 1 capsule by mouth 2 (two) times daily. Eye vitamins     Allergies:   Condrolite [glucosamine-chondroitin-msm], Glucosamine, Glucosamine forte [nutritional supplements], and Glucosamine sulfate-msm   Social History   Tobacco Use  . Smoking status: Never Smoker  . Smokeless tobacco: Never Used  Vaping Use  . Vaping Use: Never used  Substance Use Topics  . Alcohol use: No    Alcohol/week: 0.0 standard drinks  . Drug use: No     Family Hx: The patient's family history includes Cancer in her maternal grandmother; Dementia in her paternal grandmother; Diabetes in her brother; Heart Problems in her father; Kidney failure in her brother; Osteoporosis in her mother; Stroke in her father; Suicidality in an other family member. There is no history of Heart attack.  ROS:   Please see the history of present illness.     All other systems reviewed and are negative.   Labs/Other Tests and Data Reviewed:    Recent Labs: No results found for requested labs  within last 8760 hours.   Recent Lipid Panel Lab Results  Component Value Date/Time   CHOL 145 08/19/2017 08:16 AM   TRIG 101 08/19/2017 08:16 AM   HDL 47 08/19/2017 08:16 AM   CHOLHDL 3.1 08/19/2017 08:16 AM   LDLCALC 78 08/19/2017 08:16 AM    Wt Readings from Last 3 Encounters:  05/07/20 186 lb 6.4 oz (84.6 kg)  05/03/20 187 lb 9.6 oz (85.1 kg)  10/21/19 192 lb 12.8 oz (87.5 kg)     Objective:    Vital Signs:  BP 110/70   Pulse 72   Ht 5\' 2"  (1.575 m)   Wt 186 lb 6.4 oz (84.6 kg)   SpO2 95%   BMI 34.09 kg/m    GEN: Well nourished, well developed in no acute distress HEENT: Normal NECK: No JVD; No carotid bruits LYMPHATICS: No lymphadenopathy CARDIAC:irregularly irregular, no  murmurs, rubs, gallops RESPIRATORY:  Clear to auscultation without rales, wheezing or rhonchi  ABDOMEN: Soft, non-tender, non-distended MUSCULOSKELETAL:  No edema; No deformity  SKIN: Warm and dry NEUROLOGIC:  Alert and oriented x 3 PSYCHIATRIC:  Normal affect    ASSESSMENT & PLAN:    1.  OSA -  The patient is tolerating PAP therapy well without any problems. The PAP download was reviewed today and showed an AHI of 1.4/hr on 6 cm H2O with 100% compliance in using more than 4 hours nightly.  The patient has been using and benefiting from PAP use and will continue to benefit from therapy.  -I will order her a new ResMed CPAP device at 6cm H2O with heated humidity and mask of choice with supplies since her battery is end of life  2.  Permanent  AF -HR well controlled in afib -denies any palpitations.  -continue Eliquis 5mg  BID and Lopressor -SCr stable at 0.73 and Hbg 15 in Nov 2021  3.  Obesity  -I have encouraged her to continue in her routine exercise program and cut back on carbs and portions.    Medication Adjustments/Labs and Tests Ordered: Current medicines are reviewed at length with the patient today.  Concerns regarding medicines are outlined above.  Tests Ordered: No orders of the defined types were placed in this encounter.  Medication Changes: No orders of the defined types were placed in this encounter.   Disposition:  Follow up in 1 year(s)  Signed, , MD  05/07/2020 1:11 PM    [facility]

## 2020-05-07 NOTE — Patient Instructions (Addendum)
Medication Instructions:  Your physician recommends that you continue on your current medications as directed. Please refer to the Current Medication list given to you today.  *If you need a refill on your cardiac medications before your next appointment, please call your pharmacy*   Follow-Up: At [facility], you and your health needs are our priority.  As part of our continuing mission to provide you with exceptional heart care, we have created designated Provider Care Teams.  These Care Teams include your primary Cardiologist (physician) and Advanced Practice Providers (APPs -  Physician Assistants and Nurse Practitioners) who all work together to provide you with the care you need, when you need it.  Your next appointment:   8 weeks after receiving new device   The format for your next appointment:   In Person  Provider:   Armanda Magic, MD

## 2020-05-07 NOTE — Telephone Encounter (Signed)
Order placed to [facility] to Please order a new ResMed CPAP device at 6cm H2O with heated humidity and mask of choice. Battery is end of life

## 2020-05-07 NOTE — Telephone Encounter (Signed)
-----   Message from Quintella Reichert, MD sent at 05/07/2020  1:14 PM EST ----- Please order a new ResMed CPAP device at 6cm H2O with heated humidity and mask of choice. Battery is end of life

## 2020-05-08 ENCOUNTER — Other Ambulatory Visit: Payer: Self-pay | Admitting: Interventional Cardiology

## 2020-05-08 NOTE — Telephone Encounter (Signed)
Pt last saw Dr Eldridge Dace 05/03/20, last labs 03/30/20 Creat 0.73 at [facility] per [facility], age 79, weight 84.6kg, based on specified criteria pt is on appropriate dosage of Eliquis 5mg  BID.  Will refill rx.

## 2020-07-30 ENCOUNTER — Other Ambulatory Visit: Payer: Self-pay

## 2020-07-30 ENCOUNTER — Ambulatory Visit (INDEPENDENT_AMBULATORY_CARE_PROVIDER_SITE_OTHER): Payer: Medicare Other | Admitting: Ophthalmology

## 2020-07-30 ENCOUNTER — Encounter (INDEPENDENT_AMBULATORY_CARE_PROVIDER_SITE_OTHER): Payer: Self-pay | Admitting: Ophthalmology

## 2020-07-30 DIAGNOSIS — H353221 Exudative age-related macular degeneration, left eye, with active choroidal neovascularization: Secondary | ICD-10-CM

## 2020-07-30 DIAGNOSIS — H353132 Nonexudative age-related macular degeneration, bilateral, intermediate dry stage: Secondary | ICD-10-CM

## 2020-07-30 DIAGNOSIS — H35371 Puckering of macula, right eye: Secondary | ICD-10-CM

## 2020-07-30 DIAGNOSIS — H43813 Vitreous degeneration, bilateral: Secondary | ICD-10-CM

## 2020-07-30 MED ORDER — RANIBIZUMAB 0.5 MG/0.05ML IZ SOSY
0.5000 mg | PREFILLED_SYRINGE | INTRAVITREAL | Status: AC | PRN
  Administered 2020-07-30: .5 mg via INTRAVITREAL

## 2020-07-30 NOTE — Assessment & Plan Note (Signed)
Minor epiretinal tissue on the right eye none foveal distorting will observe.

## 2020-07-30 NOTE — Progress Notes (Signed)
07/30/2020     CHIEF COMPLAINT Patient presents for Retina Follow Up (3 Mo F/U OU, poss Luc 0.5 OS//Pt denies noticeable changes to [place] OU since last visit. Pt denies ocular pain, flashes of light, or floaters OU. //)   HISTORY OF PRESENT ILLNESS: Rebecca Short is a 80 y.o. 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  This started 3 months ago.  Severity is mild.  Duration of 3 months.  Since onset it is stable. Additional comments: 3 Mo F/U OU, poss Luc 0.5 OS  Pt denies noticeable changes to [place] OU since last visit. Pt denies ocular pain, flashes of light, or floaters OU.          Last edited by Ileana Roup, COA on 07/30/2020  1:50 PM. (History)      Referring physician: Catha Gosselin, MD [address] [place],  [place] [address]  HISTORICAL INFORMATION:   Selected notes from the MEDICAL RECORD NUMBER       CURRENT MEDICATIONS: No current outpatient medications on file. (Ophthalmic Drugs)   No current facility-administered medications for this visit. (Ophthalmic Drugs)   Current Outpatient Medications (Other)  Medication Sig  . calcium citrate-vitamin D (CITRACAL+D) 315-200 MG-UNIT per tablet Take 2 tablets by mouth daily.  Marland Kitchen ELIQUIS 5 MG TABS tablet TAKE 1 TABLET BY MOUTH TWICE A DAY  . EPIPEN 2-PAK 0.3 MG/0.3ML SOAJ injection Inject 0.3 mg into the muscle as needed (anaphylaxis).   . metoprolol tartrate (LOPRESSOR) 25 MG tablet TAKE 1 TABLET BY MOUTH TWICE A DAY  . metroNIDAZOLE (METROGEL) 0.75 % gel 1 APPLICATION APPLY ON THE SKIN TWICE A DAY APPLY TO FACE TWICE DAILY  . Multiple Vitamin (MULTIVITAMIN WITH MINERALS) TABS tablet Take 1 tablet by mouth daily.  . Multiple Vitamins-Minerals (PRESERVISION AREDS PO) Take 1 capsule by mouth 2 (two) times daily. Eye vitamins   No current facility-administered medications for this visit. (Other)      REVIEW OF  SYSTEMS:    ALLERGIES Allergies  Allergen Reactions  . Condrolite [Glucosamine-Chondroitin-Msm] Rash  . Glucosamine Rash  . Glucosamine Forte [Nutritional Supplements] Rash  . Glucosamine Sulfate-Msm Rash    PAST MEDICAL HISTORY Past Medical History:  Diagnosis Date  . A-fib (HCC)   . Allergy   . Macular degeneration   . OSA (obstructive sleep apnea)    mild with total AHI 7.25/hr and severe during REM sleep at 35/hr now on CPAP at 6cm H2O   Past Surgical History:  Procedure Laterality Date  . REPLACEMENT TOTAL KNEE BILATERAL      FAMILY HISTORY Family History  Problem Relation Age of Onset  . Osteoporosis Mother   . Heart Problems Father   . Stroke Father   . Cancer Maternal Grandmother   . Dementia Paternal Grandmother   . Kidney failure Brother   . Suicidality Other   . Diabetes Brother   . Heart attack Neg Hx     SOCIAL HISTORY Social History   Tobacco Use  . Smoking status: Never Smoker  . Smokeless tobacco: Never Used  Vaping Use  . Vaping Use: Never used  Substance Use Topics  . Alcohol use: No    Alcohol/week: 0.0 standard drinks  . Drug use: No         OPHTHALMIC EXAM:  Base Eye Exam    Visual Acuity (ETDRS)  Right Left   Dist cc 20/20 20/30 +2   Dist ph cc  20/20   Correction: Glasses       Tonometry (Tonopen, 1:51 PM)      Right Left   Pressure 14 14       Pupils      Pupils Dark Light Shape React APD   Right PERRL 3 2 Round Brisk None   Left PERRL 3 2 Round Brisk None       Visual Fields (Counting fingers)      Left Right    Full Full       Extraocular Movement      Right Left    Full Full       Neuro/Psych    Oriented x3: Yes   Mood/Affect: Normal       Dilation    Both eyes: 1.0% Mydriacyl, 2.5% Phenylephrine @ 1:54 PM        Slit Lamp and Fundus Exam    External Exam      Right Left   External Normal Normal       Slit Lamp Exam      Right Left   Lids/Lashes Normal Normal   Conjunctiva/Sclera  White and quiet White and quiet   Cornea Clear Clear   Anterior Chamber Deep and quiet Deep and quiet   Iris Round and reactive Round and reactive   Lens Posterior chamber intraocular lens Posterior chamber intraocular lens   Anterior Vitreous Normal Normal       Fundus Exam      Right Left   Posterior Vitreous Posterior vitreous detachment Posterior vitreous detachment   Disc Normal Normal   C/D Ratio 0.55 0.25   Macula Retinal pigment epithelial mottling, Early age related macular degeneration, no hemorrhage, no exudates, no macular thickening, Hard drusen Retinal pigment epithelial mottling, Early age related macular degeneration, Pigmented atrophy, no hemorrhage, no exudates, no macular thickening, Soft drusen   Vessels Normal Normal   Periphery Normal Normal          IMAGING AND PROCEDURES  Imaging and Procedures for 07/30/20  OCT, Retina - OU - Both Eyes       Right Eye Quality was good. Scan locations included subfoveal. Central Foveal Thickness: 297. Progression has improved. Findings include abnormal foveal contour, retinal drusen , no SRF, no IRF.   Left Eye Quality was good. Scan locations included subfoveal. Central Foveal Thickness: 292. Progression has improved. Findings include no IRF, no SRF, abnormal foveal contour.   Notes OS, remained stable at 12-week interval with history of recurrences, will repeat intravitreal Lucentis today and examination again in 3 months       Intravitreal Injection, Pharmacologic Agent - OS - Left Eye       Time Out 07/30/2020. 2:53 PM. Confirmed correct patient, procedure, site, and patient consented.   Anesthesia No anesthesia was used. Anesthetic medications included Akten 3.5%.   Procedure Preparation included Tobramycin 0.3%, 10% betadine to eyelids, 5% betadine to ocular surface, Ofloxacin . A 30 gauge needle was used.   Injection:  0.5 mg Ranibizumab SOSY (LUCENTIS) 0.5mg /0.30ml prefilled syr   NDC: 16109-604-54    Route: Intravitreal, Site: Left Eye, Waste: 0 mg  Post-op Post injection exam found visual acuity of at least counting fingers. The patient tolerated the procedure well. There were no complications. The patient received written and verbal post procedure care education. Post injection medications were not given.  ASSESSMENT/PLAN:  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HCC) Onset of last active disease reviewed with patient onset November 2019 with serous retinal detachment left eye.  That recurrence now has stabilized and patient has been following up on 48-month intervals.  We will repeat injection today and extend interval examination to 15 weeks  Macular pucker, right eye Minor epiretinal tissue on the right eye none foveal distorting will observe.  Intermediate stage nonexudative age-related macular degeneration of both eyes OD, stable no signs of active CNVM  Posterior vitreous detachment of both eyes   The nature of posterior vitreous detachment was discussed with the patient as well as its physiology, its age prevalence, and its possible implication regarding retinal breaks and detachment.  An informational brochure was given to the patient.  All the patient's questions were answered.  The patient was asked to return if new or different flashes or floaters develops.   Patient was instructed to contact office immediately if any changes were noticed. I explained to the patient that vitreous inside the eye is similar to jello inside a bowl. As the jello melts it can start to pull away from the bowl, similarly the vitreous throughout our lives can begin to pull away from the retina. That process is called a posterior vitreous detachment. In some cases, the vitreous can tug hard enough on the retina to form a retinal tear. I discussed with the patient the signs and symptoms of a retinal detachment.  Do not rub the  eye.      ICD-10-CM   1. 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HCC)  H35.3221 OCT, Retina - OU - Both Eyes    Intravitreal Injection, Pharmacologic Agent - OS - Left Eye    Ranibizumab SOSY 0.5 mg  2. Macular pucker, right eye  H35.371   3. Intermediate stage nonexudative age-related macular degeneration of both eyes  H35.3132   4. Posterior vitreous detachment of both eyes  H43.813     1.  2.  3.  Ophthalmic Meds Ordered this visit:  Meds ordered this encounter  Medications  . Ranibizumab SOSY 0.5 mg       Return in about 14 weeks (around 11/05/2020) for DILATE OU, LUCENTIS 0.5 OCT, OS.  Patient Instructions  Patient instructed to contact the office promptly for new onset visual acuity declines or distortions    Explained the diagnoses, plan, and follow up with the patient and they expressed understanding.  Patient expressed understanding of the importance of proper follow up care.   Alford Highland Rankin M.D. Diseases & Surgery of the Retina and Vitreous [facility] 07/30/20     Abbreviations: M myopia (nearsighted); A astigmatism; H hyperopia (farsighted); P presbyopia; Mrx spectacle prescription;  CTL contact lenses; OD right eye; OS left eye; OU both eyes  XT exotropia; ET esotropia; PEK punctate epithelial keratitis; PEE punctate epithelial erosions; DES dry eye syndrome; MGD meibomian gland dysfunction; ATs artificial tears; PFAT's preservative free artificial tears; NSC nuclear sclerotic cataract; PSC posterior subcapsular cataract; ERM epi-retinal membrane; PVD posterior vitreous detachment; RD retinal detachment; DM diabetes mellitus; DR diabetic retinopathy; NPDR non-proliferative diabetic retinopathy; PDR proliferative diabetic retinopathy; CSME clinically significant macular edema; DME diabetic macular edema; dbh dot blot hemorrhages; CWS cotton wool spot; POAG primary open angle glaucoma; C/D  cup-to-disc ratio; HVF humphrey visual field; GVF goldmann visual field; OCT optical coherence tomography; IOP intraocular pressure; BRVO Branch retinal vein occlusion;  CRVO central retinal vein occlusion; CRAO central retinal artery occlusion; BRAO branch retinal artery occlusion; RT retinal tear; SB scleral buckle; PPV pars plana vitrectomy; VH Vitreous hemorrhage; PRP panretinal laser photocoagulation; IVK intravitreal kenalog; VMT vitreomacular traction; MH Macular hole;  NVD neovascularization of the disc; NVE neovascularization elsewhere; AREDS age related eye disease study; ARMD age related macular degeneration; POAG primary open angle glaucoma; EBMD epithelial/anterior basement membrane dystrophy; ACIOL anterior chamber intraocular lens; IOL intraocular lens; PCIOL posterior chamber intraocular lens; Phaco/IOL phacoemulsification with intraocular lens placement; PRK photorefractive keratectomy; LASIK laser assisted in situ keratomileusis; HTN hypertension; DM diabetes mellitus; COPD chronic obstructive pulmonary disease 

## 2020-07-30 NOTE — Assessment & Plan Note (Signed)

## 2020-07-30 NOTE — Patient Instructions (Signed)
Patient instructed to contact the office promptly for new onset visual acuity declines or distortions 

## 2020-07-30 NOTE — Assessment & Plan Note (Signed)
Onset of last active disease reviewed with patient onset November 2019 with serous retinal detachment left eye.  That recurrence now has stabilized and patient has been following up on 8-month intervals.  We will repeat injection today and extend interval examination to 15 weeks

## 2020-07-30 NOTE — Assessment & Plan Note (Signed)
OD, stable no signs of active CNVM 

## 2020-08-07 ENCOUNTER — Emergency Department (HOSPITAL_COMMUNITY): Payer: Medicare Other

## 2020-08-07 ENCOUNTER — Encounter (HOSPITAL_COMMUNITY): Payer: Self-pay | Admitting: Emergency Medicine

## 2020-08-07 ENCOUNTER — Other Ambulatory Visit (HOSPITAL_COMMUNITY): Payer: Self-pay | Admitting: Emergency Medicine

## 2020-08-07 ENCOUNTER — Emergency Department (HOSPITAL_COMMUNITY)
Admission: EM | Admit: 2020-08-07 | Discharge: 2020-08-07 | Disposition: A | Discharge status: Home / Self Care | Payer: Medicare Other | Attending: Emergency Medicine | Admitting: Emergency Medicine

## 2020-08-07 ENCOUNTER — Other Ambulatory Visit: Payer: Self-pay

## 2020-08-07 DIAGNOSIS — S022XXA Fracture of nasal bones, initial encounter for closed fracture: Secondary | ICD-10-CM | POA: Insufficient documentation

## 2020-08-07 DIAGNOSIS — Y92830 Public park as the place of occurrence of the external cause: Secondary | ICD-10-CM | POA: Insufficient documentation

## 2020-08-07 DIAGNOSIS — S0191XA Laceration without foreign body of unspecified part of head, initial encounter: Secondary | ICD-10-CM | POA: Diagnosis not present

## 2020-08-07 DIAGNOSIS — Z96653 Presence of artificial knee joint, bilateral: Secondary | ICD-10-CM | POA: Diagnosis not present

## 2020-08-07 DIAGNOSIS — Z7901 Long term (current) use of anticoagulants: Secondary | ICD-10-CM | POA: Insufficient documentation

## 2020-08-07 DIAGNOSIS — S0990XA Unspecified injury of head, initial encounter: Secondary | ICD-10-CM | POA: Diagnosis not present

## 2020-08-07 DIAGNOSIS — S0033XA Contusion of nose, initial encounter: Secondary | ICD-10-CM

## 2020-08-07 DIAGNOSIS — W01198A Fall on same level from slipping, tripping and stumbling with subsequent striking against other object, initial encounter: Secondary | ICD-10-CM | POA: Diagnosis not present

## 2020-08-07 DIAGNOSIS — Y9301 Activity, walking, marching and hiking: Secondary | ICD-10-CM | POA: Diagnosis not present

## 2020-08-07 DIAGNOSIS — M899 Disorder of bone, unspecified: Secondary | ICD-10-CM | POA: Diagnosis not present

## 2020-08-07 DIAGNOSIS — S52501A Unspecified fracture of the lower end of right radius, initial encounter for closed fracture: Secondary | ICD-10-CM | POA: Insufficient documentation

## 2020-08-07 DIAGNOSIS — S0181XA Laceration without foreign body of other part of head, initial encounter: Secondary | ICD-10-CM

## 2020-08-07 DIAGNOSIS — W19XXXA Unspecified fall, initial encounter: Secondary | ICD-10-CM

## 2020-08-07 DIAGNOSIS — S6991XA Unspecified injury of right wrist, hand and finger(s), initial encounter: Secondary | ICD-10-CM | POA: Diagnosis present

## 2020-08-07 HISTORY — DX: Unspecified atrial fibrillation: I48.91

## 2020-08-07 HISTORY — DX: Unspecified macular degeneration: H35.30

## 2020-08-07 LAB — BASIC METABOLIC PANEL
Anion gap: 7 (ref 5–15)
BUN: 20 mg/dL (ref 8–23)
CO2: 28 mmol/L (ref 22–32)
Calcium: 9.1 mg/dL (ref 8.9–10.3)
Chloride: 100 mmol/L (ref 98–111)
Creatinine, Ser: 0.85 mg/dL (ref 0.44–1.00)
GFR, Estimated: >60 mL/min (ref >60)
Glucose, Bld: 138 mg/dL — ABNORMAL HIGH (ref 70–99)
Potassium: 3.9 mmol/L (ref 3.5–5.1)
Sodium: 135 mmol/L (ref 135–145)

## 2020-08-07 LAB — CBC
HCT: 48.2 % — ABNORMAL HIGH (ref 36.0–46.0)
Hemoglobin: 15.5 g/dL — ABNORMAL HIGH (ref 12.0–15.0)
MCH: 29.8 pg (ref 26.0–34.0)
MCHC: 32.2 g/dL (ref 30.0–36.0)
MCV: 92.5 fL (ref 80.0–100.0)
Platelets: 190 10*3/uL (ref 150–400)
RBC: 5.21 MIL/uL — ABNORMAL HIGH (ref 3.87–5.11)
RDW: 13.9 % (ref 11.5–15.5)
WBC: 6.1 10*3/uL (ref 4.0–10.5)
nRBC: 0 % (ref 0.0–0.2)

## 2020-08-07 MED ORDER — MORPHINE SULFATE (PF) 2 MG/ML IV SOLN
2.0000 mg | Freq: Once | INTRAVENOUS | Status: AC
Start: 2020-08-07 — End: 2020-08-07
  Administered 2020-08-07: 2 mg via INTRAVENOUS
  Filled 2020-08-07: qty 1

## 2020-08-07 MED ORDER — SODIUM CHLORIDE 0.9 % IV BOLUS
500.0000 mL | Freq: Once | INTRAVENOUS | Status: AC
  Administered 2020-08-07: 500 mL via INTRAVENOUS

## 2020-08-07 MED ORDER — OXYCODONE HCL 5 MG PO TABS: 5.0000 mg | ORAL_TABLET | Freq: Four times a day (QID) | ORAL | 0 refills | Status: DC | PRN

## 2020-08-07 NOTE — ED Provider Notes (Signed)
[facility] EMERGENCY DEPARTMENT Provider Note   CSN: [id number] Arrival date & time: 08/07/20  1219     History Chief Complaint  Patient presents with  . Fall    Rebecca Short is a 80 y.o. female.  Patient with atrial fibrillation history on Eliquis presents after fall prior to arrival.  Patient was leaning forward to pet a dog and ended up leaning forward without syncope hitting her face and head.  Patient has pain in her right wrist from catching herself and her upper face and nose area.  Pain worse with palpation any range of motion.  Patient feels well otherwise.  No fevers or chills.  No chest or abdominal pain.  Mild neck pain with range of motion.        Past Medical History:  Diagnosis Date  . Atrial fibrillation (HCC)   . Macular degeneration     There are no problems to display for this patient.   Past Surgical History:  Procedure Laterality Date  . CHOLECYSTECTOMY    . REPLACEMENT TOTAL KNEE BILATERAL       OB History   No obstetric history on file.     No family history on file.  Social History   Tobacco Use  . Smoking status: Never Smoker  . Smokeless tobacco: Never Used  Vaping Use  . Vaping Use: Never used  Substance Use Topics  . Alcohol use: Never  . Drug use: Never    Home Medications Prior to Admission medications   Not on File    Allergies    Patient has no known allergies.  Review of Systems   Review of Systems  Constitutional: Negative for chills and fever.  HENT: Negative for congestion.   Eyes: Negative for visual disturbance.  Respiratory: Negative for shortness of breath.   Cardiovascular: Negative for chest pain.  Gastrointestinal: Negative for abdominal pain and vomiting.  Genitourinary: Negative for dysuria and flank pain.  Musculoskeletal: Positive for joint swelling. Negative for back pain, neck pain and neck stiffness.  Skin: Negative for rash.  Neurological: Positive for headaches. Negative  for light-headedness.    Physical Exam Updated Vital Signs BP 136/66   Pulse 90   Temp 97.9 F (36.6 C) (Temporal)   Resp 19   Ht 5\' 2"  (1.575 m)   Wt 81.6 kg   SpO2 97%   BMI 32.92 kg/m   Physical Exam Vitals and nursing note reviewed.  Constitutional:      Appearance: She is well-developed.  HENT:     Head: Normocephalic.     Comments: Patient has swelling nasal bridge with mild left nasal deviation, no septal hematoma.  No other facial bone tenderness superficial abrasion laceration nasal bridge.  No active bleeding.  Mild left lower neck discomfort with range of motion.  Patient has 5+ strength all extremities with major movement of joints.  Sensation intact bilateral. Eyes:     General:        Right eye: No discharge.        Left eye: No discharge.     Conjunctiva/sclera: Conjunctivae normal.  Neck:     Trachea: No tracheal deviation.  Cardiovascular:     Rate and Rhythm: Normal rate and regular rhythm.  Pulmonary:     Effort: Pulmonary effort is normal.     Breath sounds: Normal breath sounds.  Abdominal:     General: There is no distension.     Palpations: Abdomen is soft.  Tenderness: There is no abdominal tenderness. There is no guarding.  Musculoskeletal:        General: Swelling, tenderness, deformity and signs of injury present.     Cervical back: Normal range of motion and neck supple.     Comments: Patient has no tenderness range of motion of hips knees or ankles bilateral.  Patient has mild to moderate tenderness dorsal distal radius on the right neurovascularly intact.  Skin:    General: Skin is warm.     Findings: No rash.  Neurological:     General: No focal deficit present.     Mental Status: She is alert and oriented to person, place, and time.     Cranial Nerves: No cranial nerve deficit.  Psychiatric:        Mood and Affect: Affect is tearful.     ED Results / Procedures / Treatments   Labs (all labs ordered are listed, but only  abnormal results are displayed) Labs Reviewed  CBC - Abnormal; Notable for the following components:      Result Value   RBC 5.21 (*)    Hemoglobin 15.5 (*)    HCT 48.2 (*)    All other components within normal limits  BASIC METABOLIC PANEL - Abnormal; Notable for the following components:   Glucose, Bld 138 (*)    All other components within normal limits    EKG None  Radiology DG Wrist Complete Right  Result Date: 08/07/2020 CLINICAL DATA:  Fall, pain, deformity EXAM: RIGHT WRIST - COMPLETE 3+ VIEW COMPARISON:  None. FINDINGS: There is a comminuted intra-articular distal left radial fracture. Ulnar styloid fracture also noted. Distal radial fragments are mildly displaced and angulated posteriorly. Lucent lesion seen within the distal metadiaphysis of the right radius within the posterior cortex. IMPRESSION: Comminuted, comminuted, posteriorly displaced and angulated distal left radial fracture. Ulnar styloid fracture. Nonspecific lucent lesion within the posterior cortex of the left radial metadiaphysis. Cannot exclude metastasis or myeloma. Electronically Signed   By: Charlett Nose M.D.   On: 08/07/2020 13:34   CT Head Wo Contrast  Result Date: 08/07/2020 CLINICAL DATA:  Patient fell forward hitting face on ground. EXAM: CT HEAD WITHOUT CONTRAST CT MAXILLOFACIAL WITHOUT CONTRAST CT CERVICAL SPINE WITHOUT CONTRAST TECHNIQUE: Multidetector CT imaging of the head, cervical spine, and maxillofacial structures were performed using the standard protocol without intravenous contrast. Multiplanar CT image reconstructions of the cervical spine and maxillofacial structures were also generated. COMPARISON:  None. FINDINGS: CT HEAD FINDINGS Brain: There is no evidence for acute hemorrhage, hydrocephalus, mass lesion, or abnormal extra-axial fluid collection. No definite CT evidence for acute infarction. Vascular: No hyperdense vessel or unexpected calcification. Skull: Normal. Negative for fracture or  focal lesion. Other: None. CT MAXILLOFACIAL FINDINGS Osseous: Bilateral nasal bone fractures, depressed on the right. Associated fracture of the nasal septum. No medial or inferior orbital wall blowout fracture. No associated fracture of the maxillary sinuses. Zygomatic arch is intact bilaterally. Mandible is intact. Temporomandibular joints are located. Orbits: Negative. No traumatic or inflammatory finding. Sinuses: Clear. Soft tissues: Negative. CT CERVICAL SPINE FINDINGS Alignment: Sclerotic foci in the C5 vertebral body and C3 spinous process are likely bone islands. No fracture. No subluxation. Skull base and vertebrae: No acute fracture. No primary bone lesion or focal pathologic process. Soft tissues and spinal canal: No prevertebral fluid or swelling. No visible canal hematoma. Disc levels: Loss of disc height with endplate degeneration noted C4-5 and C5-6. Upper chest: Negative. Other: None. IMPRESSION: 1. Unremarkable  CT evaluation of the brain. 2. Bilateral nasal bone fractures, depressed on the right. Associated fracture of the nasal septum. No other acute fracture in the bony anatomy of the face. Paranasal sinuses are clear. 3. No cervical spine fracture. 4. 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 Electronically Signed   By: Kennith Center M.D.   On: 08/07/2020 14:26   CT Cervical Spine Wo Contrast  Result Date: 08/07/2020 CLINICAL DATA:  Patient fell forward hitting face on ground. EXAM: CT HEAD WITHOUT CONTRAST CT MAXILLOFACIAL WITHOUT CONTRAST CT CERVICAL SPINE WITHOUT CONTRAST TECHNIQUE: Multidetector CT imaging of the head, cervical spine, and maxillofacial structures were performed using the standard protocol without intravenous contrast. Multiplanar CT image reconstructions of the cervical spine and maxillofacial structures were also generated. COMPARISON:  None. FINDINGS: CT HEAD FINDINGS Brain: There is no evidence for acute hemorrhage, hydrocephalus, mass lesion, or abnormal  extra-axial fluid collection. No definite CT evidence for acute infarction. Vascular: No hyperdense vessel or unexpected calcification. Skull: Normal. Negative for fracture or focal lesion. Other: None. CT MAXILLOFACIAL FINDINGS Osseous: Bilateral nasal bone fractures, depressed on the right. Associated fracture of the nasal septum. No medial or inferior orbital wall blowout fracture. No associated fracture of the maxillary sinuses. Zygomatic arch is intact bilaterally. Mandible is intact. Temporomandibular joints are located. Orbits: Negative. No traumatic or inflammatory finding. Sinuses: Clear. Soft tissues: Negative. CT CERVICAL SPINE FINDINGS Alignment: Sclerotic foci in the C5 vertebral body and C3 spinous process are likely bone islands. No fracture. No subluxation. Skull base and vertebrae: No acute fracture. No primary bone lesion or focal pathologic process. Soft tissues and spinal canal: No prevertebral fluid or swelling. No visible canal hematoma. Disc levels: Loss of disc height with endplate degeneration noted C4-5 and C5-6. Upper chest: Negative. Other: None. IMPRESSION: 1. Unremarkable CT evaluation of the brain. 2. Bilateral nasal bone fractures, depressed on the right. Associated fracture of the nasal septum. No other acute fracture in the bony anatomy of the face. Paranasal sinuses are clear. 3. No cervical spine fracture. 4. 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 Electronically Signed   By: Kennith Center M.D.   On: 08/07/2020 14:26   CT Maxillofacial Wo Contrast  Result Date: 08/07/2020 CLINICAL DATA:  Patient fell forward hitting face on ground. EXAM: CT HEAD WITHOUT CONTRAST CT MAXILLOFACIAL WITHOUT CONTRAST CT CERVICAL SPINE WITHOUT CONTRAST TECHNIQUE: Multidetector CT imaging of the head, cervical spine, and maxillofacial structures were performed using the standard protocol without intravenous contrast. Multiplanar CT image reconstructions of the cervical spine and maxillofacial  structures were also generated. COMPARISON:  None. FINDINGS: CT HEAD FINDINGS Brain: There is no evidence for acute hemorrhage, hydrocephalus, mass lesion, or abnormal extra-axial fluid collection. No definite CT evidence for acute infarction. Vascular: No hyperdense vessel or unexpected calcification. Skull: Normal. Negative for fracture or focal lesion. Other: None. CT MAXILLOFACIAL FINDINGS Osseous: Bilateral nasal bone fractures, depressed on the right. Associated fracture of the nasal septum. No medial or inferior orbital wall blowout fracture. No associated fracture of the maxillary sinuses. Zygomatic arch is intact bilaterally. Mandible is intact. Temporomandibular joints are located. Orbits: Negative. No traumatic or inflammatory finding. Sinuses: Clear. Soft tissues: Negative. CT CERVICAL SPINE FINDINGS Alignment: Sclerotic foci in the C5 vertebral body and C3 spinous process are likely bone islands. No fracture. No subluxation. Skull base and vertebrae: No acute fracture. No primary bone lesion or focal pathologic process. Soft tissues and spinal canal: No prevertebral fluid or swelling. No visible canal hematoma.  Disc levels: Loss of disc height with endplate degeneration noted C4-5 and C5-6. Upper chest: Negative. Other: None. IMPRESSION: 1. Unremarkable CT evaluation of the brain. 2. Bilateral nasal bone fractures, depressed on the right. Associated fracture of the nasal septum. No other acute fracture in the bony anatomy of the face. Paranasal sinuses are clear. 3. No cervical spine fracture. 4. 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 Electronically Signed   By: Kennith Center M.D.   On: 08/07/2020 14:26    Procedures Procedures   Medications Ordered in ED Medications  morphine 2 MG/ML injection 2 mg (2 mg Intravenous Given 08/07/20 1247)  sodium chloride 0.9 % bolus 500 mL (0 mLs Intravenous Stopped 08/07/20 1416)    ED Course  I have reviewed the triage vital signs and the nursing  notes.  Pertinent labs & imaging results that were available during my care of the patient were reviewed by me and considered in my medical decision making (see chart for details).    MDM Rules/Calculators/A&P                          Patient presents after mechanical fall leading to clinical concern for distal radius fracture in the right, nasal bone fracture and acute head and neck injury.  Plan for general blood work, small IV fluid bolus, pain meds as needed and CT and x-rays.  Blood work ordered and reviewed normal hemoglobin, normal electrolytes.  CT scans reviewed no intracranial bleeding, no neck fracture, nasal bone fractures closed.  X-ray showed comminuted right distal radius fracture with lucency in the radius that needs outpatient follow-up.  Discussed with orthopedics for consult and they will see the patient in the ER to discuss reduction and further care.  Patient's pain controlled this time.  Patient prefers [facility].  Discussed with Dr. Ross Marcus office and they will follow patient up outpatient, patient is to call for an appointment for nasal bone fractures.  Supportive care discussed.  Patient CARE signed out to follow-up orthopedic and hand recommendations.  Final Clinical Impression(s) / ED Diagnoses Final diagnoses:  Fall, initial encounter  Acute head injury, initial encounter  Contusion of nose, initial encounter  Facial laceration, initial encounter  Closed fracture of distal end of right radius, unspecified fracture morphology, initial encounter  Bone lesion  Closed fracture of nasal bone, initial encounter    Rx / DC Orders ED Discharge Orders    None       Blane Ohara, MD 08/07/20 1533

## 2020-08-07 NOTE — Consult Note (Signed)
Reason for Consult:Right wrist fx Referring Physician: Abran Duke Time called: 1512 Time at bedside: 1527   Rebecca Short is an 80 y.o. female.  HPI: Rebecca Short was bending down to pet a dog when she lost her balance and fell forward. She put her right hand out to break her fall but had immediate wrist pain. She also hit her face. She was brought to the ED as a level 2 trauma activation 2/2 fall on anticoagulant. X-rays showed a right wrist fx and hand surgery was consulted. She is RHD and doesn't work.  Past Medical History:  Diagnosis Date  . Atrial fibrillation (HCC)   . Macular degeneration     Past Surgical History:  Procedure Laterality Date  . CHOLECYSTECTOMY    . REPLACEMENT TOTAL KNEE BILATERAL      No family history on file.  Social History:  reports that she has never smoked. She has never used smokeless tobacco. She reports that she does not drink alcohol and does not use drugs.  Allergies: No Known Allergies  Medications: I have reviewed the patient's current medications.  Results for orders placed or performed during the hospital encounter of 08/07/20 (from the past 48 hour(s))  CBC     Status: Abnormal   Collection Time: 08/07/20 12:23 PM  Result Value Ref Range   WBC 6.1 4.0 - 10.5 K/uL   RBC 5.21 (H) 3.87 - 5.11 MIL/uL   Hemoglobin 15.5 (H) 12.0 - 15.0 g/dL   HCT 71.0 (H) 62.6 - 94.8 %   MCV 92.5 80.0 - 100.0 fL   MCH 29.8 26.0 - 34.0 pg   MCHC 32.2 30.0 - 36.0 g/dL   RDW 54.6 27.0 - 35.0 %   Platelets 190 150 - 400 K/uL   nRBC 0.0 0.0 - 0.2 %    Comment: Performed at [facility], [address], [place], [place] [address]  Basic metabolic panel     Status: Abnormal   Collection Time: 08/07/20 12:23 PM  Result Value Ref Range   Sodium 135 135 - 145 mmol/L   Potassium 3.9 3.5 - 5.1 mmol/L   Chloride 100 98 - 111 mmol/L   CO2 28 22 - 32 mmol/L   Glucose, Bld 138 (H) 70 - 99 mg/dL    Comment: Glucose reference range applies only to samples taken after  fasting for at least 8 hours.   BUN 20 8 - 23 mg/dL   Creatinine, Ser 8.29 0.44 - 1.00 mg/dL   Calcium 9.1 8.9 - 93.7 mg/dL   GFR, Estimated >16 >96 mL/min    Comment: (NOTE) Calculated using the CKD-EPI Creatinine Equation (2021)    Anion gap 7 5 - 15    Comment: Performed at [facility], [address] [place], [place] [address]    DG Wrist Complete Right  Result Date: 08/07/2020 CLINICAL DATA:  Fall, pain, deformity EXAM: RIGHT WRIST - COMPLETE 3+ VIEW COMPARISON:  None. FINDINGS: There is a comminuted intra-articular distal left radial fracture. Ulnar styloid fracture also noted. Distal radial fragments are mildly displaced and angulated posteriorly. Lucent lesion seen within the distal metadiaphysis of the right radius within the posterior cortex. IMPRESSION: Comminuted, comminuted, posteriorly displaced and angulated distal left radial fracture. Ulnar styloid fracture. Nonspecific lucent lesion within the posterior cortex of the left radial metadiaphysis. Cannot exclude metastasis or myeloma. Electronically Signed   By: Charlett Nose M.D.   On: 08/07/2020 13:34   CT Head Wo Contrast  Result  Date: 08/07/2020 CLINICAL DATA:  Patient fell forward hitting face on ground. EXAM: CT HEAD WITHOUT CONTRAST CT MAXILLOFACIAL WITHOUT CONTRAST CT CERVICAL SPINE WITHOUT CONTRAST TECHNIQUE: Multidetector CT imaging of the head, cervical spine, and maxillofacial structures were performed using the standard protocol without intravenous contrast. Multiplanar CT image reconstructions of the cervical spine and maxillofacial structures were also generated. COMPARISON:  None. FINDINGS: CT HEAD FINDINGS Brain: There is no evidence for acute hemorrhage, hydrocephalus, mass lesion, or abnormal extra-axial fluid collection. No definite CT evidence for acute infarction. Vascular: No hyperdense vessel or unexpected calcification. Skull: Normal. Negative for fracture or focal lesion. Other: None. CT MAXILLOFACIAL  FINDINGS Osseous: Bilateral nasal bone fractures, depressed on the right. Associated fracture of the nasal septum. No medial or inferior orbital wall blowout fracture. No associated fracture of the maxillary sinuses. Zygomatic arch is intact bilaterally. Mandible is intact. Temporomandibular joints are located. Orbits: Negative. No traumatic or inflammatory finding. Sinuses: Clear. Soft tissues: Negative. CT CERVICAL SPINE FINDINGS Alignment: Sclerotic foci in the C5 vertebral body and C3 spinous process are likely bone islands. No fracture. No subluxation. Skull base and vertebrae: No acute fracture. No primary bone lesion or focal pathologic process. Soft tissues and spinal canal: No prevertebral fluid or swelling. No visible canal hematoma. Disc levels: Loss of disc height with endplate degeneration noted C4-5 and C5-6. Upper chest: Negative. Other: None. IMPRESSION: 1. Unremarkable CT evaluation of the brain. 2. Bilateral nasal bone fractures, depressed on the right. Associated fracture of the nasal septum. No other acute fracture in the bony anatomy of the face. Paranasal sinuses are clear. 3. No cervical spine fracture. 4. 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 Electronically Signed   By: Kennith Center M.D.   On: 08/07/2020 14:26   CT Cervical Spine Wo Contrast  Result Date: 08/07/2020 CLINICAL DATA:  Patient fell forward hitting face on ground. EXAM: CT HEAD WITHOUT CONTRAST CT MAXILLOFACIAL WITHOUT CONTRAST CT CERVICAL SPINE WITHOUT CONTRAST TECHNIQUE: Multidetector CT imaging of the head, cervical spine, and maxillofacial structures were performed using the standard protocol without intravenous contrast. Multiplanar CT image reconstructions of the cervical spine and maxillofacial structures were also generated. COMPARISON:  None. FINDINGS: CT HEAD FINDINGS Brain: There is no evidence for acute hemorrhage, hydrocephalus, mass lesion, or abnormal extra-axial fluid collection. No definite CT evidence  for acute infarction. Vascular: No hyperdense vessel or unexpected calcification. Skull: Normal. Negative for fracture or focal lesion. Other: None. CT MAXILLOFACIAL FINDINGS Osseous: Bilateral nasal bone fractures, depressed on the right. Associated fracture of the nasal septum. No medial or inferior orbital wall blowout fracture. No associated fracture of the maxillary sinuses. Zygomatic arch is intact bilaterally. Mandible is intact. Temporomandibular joints are located. Orbits: Negative. No traumatic or inflammatory finding. Sinuses: Clear. Soft tissues: Negative. CT CERVICAL SPINE FINDINGS Alignment: Sclerotic foci in the C5 vertebral body and C3 spinous process are likely bone islands. No fracture. No subluxation. Skull base and vertebrae: No acute fracture. No primary bone lesion or focal pathologic process. Soft tissues and spinal canal: No prevertebral fluid or swelling. No visible canal hematoma. Disc levels: Loss of disc height with endplate degeneration noted C4-5 and C5-6. Upper chest: Negative. Other: None. IMPRESSION: 1. Unremarkable CT evaluation of the brain. 2. Bilateral nasal bone fractures, depressed on the right. Associated fracture of the nasal septum. No other acute fracture in the bony anatomy of the face. Paranasal sinuses are clear. 3. No cervical spine fracture. 4. 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 Electronically  Signed   By: Kennith Center M.D.   On: 08/07/2020 14:26   CT Maxillofacial Wo Contrast  Result Date: 08/07/2020 CLINICAL DATA:  Patient fell forward hitting face on ground. EXAM: CT HEAD WITHOUT CONTRAST CT MAXILLOFACIAL WITHOUT CONTRAST CT CERVICAL SPINE WITHOUT CONTRAST TECHNIQUE: Multidetector CT imaging of the head, cervical spine, and maxillofacial structures were performed using the standard protocol without intravenous contrast. Multiplanar CT image reconstructions of the cervical spine and maxillofacial structures were also generated. COMPARISON:  None.  FINDINGS: CT HEAD FINDINGS Brain: There is no evidence for acute hemorrhage, hydrocephalus, mass lesion, or abnormal extra-axial fluid collection. No definite CT evidence for acute infarction. Vascular: No hyperdense vessel or unexpected calcification. Skull: Normal. Negative for fracture or focal lesion. Other: None. CT MAXILLOFACIAL FINDINGS Osseous: Bilateral nasal bone fractures, depressed on the right. Associated fracture of the nasal septum. No medial or inferior orbital wall blowout fracture. No associated fracture of the maxillary sinuses. Zygomatic arch is intact bilaterally. Mandible is intact. Temporomandibular joints are located. Orbits: Negative. No traumatic or inflammatory finding. Sinuses: Clear. Soft tissues: Negative. CT CERVICAL SPINE FINDINGS Alignment: Sclerotic foci in the C5 vertebral body and C3 spinous process are likely bone islands. No fracture. No subluxation. Skull base and vertebrae: No acute fracture. No primary bone lesion or focal pathologic process. Soft tissues and spinal canal: No prevertebral fluid or swelling. No visible canal hematoma. Disc levels: Loss of disc height with endplate degeneration noted C4-5 and C5-6. Upper chest: Negative. Other: None. IMPRESSION: 1. Unremarkable CT evaluation of the brain. 2. Bilateral nasal bone fractures, depressed on the right. Associated fracture of the nasal septum. No other acute fracture in the bony anatomy of the face. Paranasal sinuses are clear. 3. No cervical spine fracture. 4. 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 Electronically Signed   By: Kennith Center M.D.   On: 08/07/2020 14:26    Review of Systems  HENT: Negative for ear discharge, ear pain, hearing loss and tinnitus.   Eyes: Negative for photophobia and pain.  Respiratory: Negative for cough and shortness of breath.   Cardiovascular: Negative for chest pain.  Gastrointestinal: Negative for abdominal pain, nausea and vomiting.  Genitourinary: Negative for  dysuria, flank pain, frequency and urgency.  Musculoskeletal: Positive for arthralgias (Right wrist). Negative for back pain, myalgias and neck pain.  Neurological: Negative for dizziness and headaches.  Hematological: Does not bruise/bleed easily.  Psychiatric/Behavioral: The patient is not nervous/anxious.    Blood pressure 136/66, pulse 90, temperature 97.9 F (36.6 C), temperature source Temporal, resp. rate 19, height 5\' 2"  (1.575 m), weight 81.6 kg, SpO2 97 %. Physical Exam Constitutional:      General: She is not in acute distress.    Appearance: She is well-developed. She is not diaphoretic.  HENT:     Head: Normocephalic.  Eyes:     General: No scleral icterus.       Right eye: No discharge.        Left eye: No discharge.     Conjunctiva/sclera: Conjunctivae normal.  Cardiovascular:     Rate and Rhythm: Normal rate and regular rhythm.  Pulmonary:     Effort: Pulmonary effort is normal. No respiratory distress.  Musculoskeletal:     Cervical back: Normal range of motion.     Comments: Right shoulder, elbow, wrist, digits- no skin wounds, mod TTP wrist, no instability, no blocks to motion  Sens  Ax/R/M/U intact  Mot   Ax/ R/ PIN/ M/ AIN/ U intact  Rad 2+  Skin:    General: Skin is warm and dry.  Neurological:     Mental Status: She is alert.  Psychiatric:        Behavior: Behavior normal.     Assessment/Plan: Right wrist fx -- Can put in splint and see Dr. Amanda Pea as OP. Suspect will be able to manage non-operatively. NWB.    Freeman Caldron, PA-C Orthopedic Surgery [phone] 08/07/2020, 3:41 PM

## 2020-08-07 NOTE — ED Notes (Signed)
Patient transported to CT 

## 2020-08-07 NOTE — Discharge Instructions (Addendum)
You need to call to make appointments with an orthopedic and ENT doctor for your broken wrist and nose.    Pain medicine as needed.  Tylenol at baseline.  Oxycodone for severe pain only.

## 2020-08-07 NOTE — ED Triage Notes (Signed)
Pt from park where she was walking and bent down to pet a dog, fell forward, hitting her face. Lac to nose and both wrists. Denies LOC. On eliquis. GCS 15.

## 2020-08-07 NOTE — Progress Notes (Signed)
Orthopedic Tech Progress Note Patient Details:  Rebecca Short Nov 22, 1940 [id number] Level 2 trauma Patient ID: [name], female   DOB: 1940/10/22, 80 y.o.   MRN: [id number]   [name] 08/07/2020, 12:45 PM

## 2020-08-08 ENCOUNTER — Encounter (INDEPENDENT_AMBULATORY_CARE_PROVIDER_SITE_OTHER): Payer: Self-pay | Admitting: Ophthalmology

## 2020-09-30 ENCOUNTER — Other Ambulatory Visit: Payer: Self-pay | Admitting: Interventional Cardiology

## 2020-11-05 ENCOUNTER — Other Ambulatory Visit: Payer: Self-pay

## 2020-11-05 ENCOUNTER — Ambulatory Visit (INDEPENDENT_AMBULATORY_CARE_PROVIDER_SITE_OTHER): Payer: Medicare Other | Admitting: Ophthalmology

## 2020-11-05 ENCOUNTER — Encounter (INDEPENDENT_AMBULATORY_CARE_PROVIDER_SITE_OTHER): Payer: Self-pay | Admitting: Ophthalmology

## 2020-11-05 DIAGNOSIS — H43813 Vitreous degeneration, bilateral: Secondary | ICD-10-CM

## 2020-11-05 DIAGNOSIS — H353221 Exudative age-related macular degeneration, left eye, with active choroidal neovascularization: Secondary | ICD-10-CM | POA: Diagnosis not present

## 2020-11-05 DIAGNOSIS — H353132 Nonexudative age-related macular degeneration, bilateral, intermediate dry stage: Secondary | ICD-10-CM

## 2020-11-05 DIAGNOSIS — H35371 Puckering of macula, right eye: Secondary | ICD-10-CM | POA: Diagnosis not present

## 2020-11-05 MED ORDER — RANIBIZUMAB 0.5 MG/0.05ML IZ SOSY
0.5000 mg | PREFILLED_SYRINGE | INTRAVITREAL | Status: AC | PRN
  Administered 2020-11-05: .5 mg via INTRAVITREAL

## 2020-11-05 MED ORDER — BEVACIZUMAB 2.5 MG/0.1ML IZ SOSY: 2.5000 mg | PREFILLED_SYRINGE | INTRAVITREAL | Status: DC | PRN

## 2020-11-05 NOTE — Assessment & Plan Note (Signed)
Minor perifoveal epiretinal tissue, no foveal distortion right eye with no impact on acuity observe

## 2020-11-05 NOTE — Assessment & Plan Note (Signed)

## 2020-11-05 NOTE — Progress Notes (Signed)
11/05/2020     CHIEF COMPLAINT Patient presents for Retina Follow Up (14 week fu OU and Lucentis OS/Pt states VA OU stable since last visit. Pt denies FOL, floaters, or ocular pain OU. /)   HISTORY OF PRESENT ILLNESS: Rebecca Short is a 80 y.o. female who presents to the clinic today for:   HPI     Retina Follow Up           Diagnosis: Wet AMD   Laterality: left eye   Onset: 14 weeks ago   Severity: mild   Duration: 14 weeks   Course: stable   Comments: 14 week fu OU and Lucentis OS Pt states VA OU stable since last visit. Pt denies FOL, floaters, or ocular pain OU.         Last edited by Demetrios Loll, COA on 11/05/2020  1:35 PM.      Referring physician: Catha Gosselin, MD [address] [place],  [place] [address]  HISTORICAL INFORMATION:   Selected notes from the MEDICAL RECORD NUMBER       CURRENT MEDICATIONS: No current outpatient medications on file. (Ophthalmic Drugs)   No current facility-administered medications for this visit. (Ophthalmic Drugs)   Current Outpatient Medications (Other)  Medication Sig   calcium citrate-vitamin D (CITRACAL+D) 315-200 MG-UNIT per tablet Take 2 tablets by mouth daily.   ELIQUIS 5 MG TABS tablet TAKE 1 TABLET BY MOUTH TWICE A DAY   EPIPEN 2-PAK 0.3 MG/0.3ML SOAJ injection Inject 0.3 mg into the muscle as needed (anaphylaxis).    metoprolol tartrate (LOPRESSOR) 25 MG tablet TAKE 1 TABLET BY MOUTH TWICE A DAY   metroNIDAZOLE (METROGEL) 0.75 % gel 1 APPLICATION APPLY ON THE SKIN TWICE A DAY APPLY TO FACE TWICE DAILY   Multiple Vitamin (MULTIVITAMIN WITH MINERALS) TABS tablet Take 1 tablet by mouth daily.   Multiple Vitamins-Minerals (PRESERVISION AREDS PO) Take 1 capsule by mouth 2 (two) times daily. Eye vitamins   oxyCODONE (OXY IR/ROXICODONE) 5 MG immediate release tablet TAKE 1 TABLET (5 MG TOTAL) BY MOUTH EVERY 6 (SIX) HOURS AS NEEDED FOR UP TO 15 DOSES FOR SEVERE PAIN.   oxyCODONE (ROXICODONE) 5 MG immediate release  tablet Take 1 tablet (5 mg total) by mouth every 6 (six) hours as needed for up to 15 doses for severe pain.   No current facility-administered medications for this visit. (Other)      REVIEW OF SYSTEMS:    ALLERGIES Allergies  Allergen Reactions   Condrolite [Glucosamine-Chondroitin-Msm] Rash   Glucosamine Rash   Glucosamine Forte [Nutritional Supplements] Rash   Glucosamine Sulfate-Msm Rash    PAST MEDICAL HISTORY Past Medical History:  Diagnosis Date   A-fib (HCC)    Allergy    Atrial fibrillation (HCC)    Macular degeneration    OSA (obstructive sleep apnea)    mild with total AHI 7.25/hr and severe during REM sleep at 35/hr now on CPAP at 6cm H2O   Past Surgical History:  Procedure Laterality Date   CHOLECYSTECTOMY     REPLACEMENT TOTAL KNEE BILATERAL      FAMILY HISTORY Family History  Problem Relation Age of Onset   Osteoporosis Mother    Heart Problems Father    Stroke Father    Cancer Maternal Grandmother    Dementia Paternal Grandmother    Kidney failure Brother    Suicidality Other    Diabetes Brother    Heart attack Neg Hx     SOCIAL HISTORY Social History  Tobacco Use   Smoking status: Never   Smokeless tobacco: Never  Vaping Use   Vaping Use: Never used  Substance Use Topics   Alcohol use: Never   Drug use: Never         OPHTHALMIC EXAM:  Base Eye Exam     Visual Acuity (ETDRS)       Right Left   Dist cc 20/25 +2 20/25 -2    Correction: Glasses         Tonometry (Tonopen, 1:39 PM)       Right Left   Pressure 12 11         Pupils       Pupils Dark Light Shape React APD   Right PERRL 3 2 Round Brisk None   Left PERRL 3 2 Round Brisk None         Visual Fields (Counting fingers)       Left Right    Full Full         Extraocular Movement       Right Left    Full Full         Neuro/Psych     Oriented x3: Yes   Mood/Affect: Normal         Dilation     Both eyes: 1.0% Mydriacyl, 2.5%  Phenylephrine @ 1:39 PM           Slit Lamp and Fundus Exam     External Exam       Right Left   External Normal Normal         Slit Lamp Exam       Right Left   Lids/Lashes Normal Normal   Conjunctiva/Sclera White and quiet White and quiet   Cornea Clear Clear   Anterior Chamber Deep and quiet Deep and quiet   Iris Round and reactive Round and reactive   Lens Posterior chamber intraocular lens Posterior chamber intraocular lens   Anterior Vitreous Normal Normal         Fundus Exam       Right Left   Posterior Vitreous Posterior vitreous detachment Posterior vitreous detachment   Disc Normal Normal   C/D Ratio 0.55 0.25   Macula Retinal pigment epithelial mottling, Early age related macular degeneration, no hemorrhage, no exudates, no macular thickening, Hard drusen Retinal pigment epithelial mottling, Early age related macular degeneration, Pigmented atrophy, no hemorrhage, no exudates, no macular thickening, Soft drusen   Vessels Normal Normal   Periphery Normal Normal            IMAGING AND PROCEDURES  Imaging and Procedures for 11/05/20  OCT, Retina - OU - Both Eyes       Right Eye Quality was good. Scan locations included subfoveal. Central Foveal Thickness: 301. Progression has improved. Findings include abnormal foveal contour, retinal drusen , no SRF, no IRF.   Left Eye Quality was good. Scan locations included subfoveal. Central Foveal Thickness: 290. Progression has improved. Findings include no IRF, no SRF, abnormal foveal contour.   Notes OS, remained stable at 14-week interval with history of recurrences, will repeat intravitreal Lucentis today and examination again in 3 months  We will continue to monitor region temporal to the fovea with irregular drusenoid pigment epithelial detachment as well as subretinal fluid     Intravitreal Injection, Pharmacologic Agent - OS - Left Eye       Time Out 11/05/2020. 2:19 PM. Confirmed correct  patient, procedure, site, and patient consented.  Anesthesia No anesthesia was used. Anesthetic medications included Akten 3.5%.   Procedure Preparation included Tobramycin 0.3%, 10% betadine to eyelids, 5% betadine to ocular surface, Ofloxacin . A 30 gauge needle was used.   Injection: 0.5 mg Ranibizumab 0.5 MG/0.05ML   Route: Intravitreal   NDC: 50242-080-03, Lot: [id number], Waste: 0 mL   Post-op Post injection exam found visual acuity of at least counting fingers. The patient tolerated the procedure well. There were no complications. The patient received written and verbal post procedure care education. Post injection medications were not given.              ASSESSMENT/PLAN:  Intermediate stage nonexudative age-related macular degeneration of both eyes No signs of CNVM OD  Macular pucker, right eye Minor perifoveal epiretinal tissue, no foveal distortion right eye with no impact on acuity observe  Posterior vitreous detachment of both eyes  The nature of posterior vitreous detachment was discussed with the patient as well as its physiology, its age prevalence, and its possible implication regarding retinal breaks and detachment.  An informational brochure was offered to the patient.  All the patient's questions were answered.  The patient was asked to return if new or different flashes or floaters develops.   Patient was instructed to contact office immediately if any new changes were noticed. I explained to the patient that vitreous inside the eye is similar to jello inside a bowl. As the jello melts it can start to pull away from the bowl, similarly the vitreous throughout our lives can begin to pull away from the retina. That process is called a posterior vitreous detachment. In some cases, the vitreous can tug hard enough on the retina to form a retinal tear. I discussed with the patient the signs and symptoms of a retinal detachment.  Do not rub the eye.    Exudative  age-related macular degeneration of left eye with active choroidal neovascularization (HCC) History of chronic active disease and even recurrent disease.  Resistant Avastin and Eylea in the past.  Responsive only to Lucentis 0.5.  Region temporal to the fovea will need to continue to monitor and continue to control using quarterly Lucentis today and evaluate again in approximate 3 months     ICD-10-CM   1. 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HCC)  H35.3221 OCT, Retina - OU - Both Eyes    Intravitreal Injection, Pharmacologic Agent - OS - Left Eye    Ranibizumab SOSY 0.5 mg    DISCONTINUED: bevacizumab (AVASTIN) SOSY 2.5 mg    2. Intermediate stage nonexudative age-related macular degeneration of both eyes  H35.3132     3. Macular pucker, right eye  H35.371     4. Posterior vitreous detachment of both eyes  H43.813       1.  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  Repeat intravitreal Lucentis 0.5 mg today OS  3.  Dilate OS only next likely injection Lucentis  4.  Error in documentation on computer screen for procedure, noted the use of Avastin or bevacizumab.  This is an error, as Lucentis 0.5 mg alone was delivered to the left eye today intravitreal  Ophthalmic Meds Ordered this visit:  Meds ordered this encounter  Medications   Ranibizumab SOSY 0.5 mg   DISCONTD: bevacizumab (  AVASTIN) SOSY 2.5 mg       Return in about 3 months (around 02/05/2021) for dilate, OS, LUCENTIS 0.5 OCT.  There are no Patient Instructions on file for this visit.   Explained the diagnoses, plan, and follow up with the patient and they expressed understanding.  Patient expressed understanding of the importance of proper follow up care.   Alford Highland Kylee Nardozzi M.D. Diseases  & Surgery of the Retina and Vitreous [facility] 11/05/20     Abbreviations: M myopia (nearsighted); A astigmatism; H hyperopia (farsighted); P presbyopia; Mrx spectacle prescription;  CTL contact lenses; OD right eye; OS left eye; OU both eyes  XT exotropia; ET esotropia; PEK punctate epithelial keratitis; PEE punctate epithelial erosions; DES dry eye syndrome; MGD meibomian gland dysfunction; ATs artificial tears; PFAT's preservative free artificial tears; NSC nuclear sclerotic cataract; PSC posterior subcapsular cataract; ERM epi-retinal membrane; PVD posterior vitreous detachment; RD retinal detachment; DM diabetes mellitus; DR diabetic retinopathy; NPDR non-proliferative diabetic retinopathy; PDR proliferative diabetic retinopathy; CSME clinically significant macular edema; DME diabetic macular edema; dbh dot blot hemorrhages; CWS cotton wool spot; POAG primary open angle glaucoma; C/D cup-to-disc ratio; HVF humphrey visual field; GVF goldmann visual field; OCT optical coherence tomography; IOP intraocular pressure; BRVO Branch retinal vein occlusion; CRVO central retinal vein occlusion; CRAO central retinal artery occlusion; BRAO branch retinal artery occlusion; RT retinal tear; SB scleral buckle; PPV pars plana vitrectomy; VH Vitreous hemorrhage; PRP panretinal laser photocoagulation; IVK intravitreal kenalog; VMT vitreomacular traction; MH Macular hole;  NVD neovascularization of the disc; NVE neovascularization elsewhere; AREDS age related eye disease study; ARMD age related macular degeneration; POAG primary open angle glaucoma; EBMD epithelial/anterior basement membrane dystrophy; ACIOL anterior chamber intraocular lens; IOL intraocular lens; PCIOL posterior chamber intraocular lens; Phaco/IOL phacoemulsification with intraocular lens placement; PRK photorefractive keratectomy; LASIK laser assisted in situ keratomileusis; HTN hypertension; DM diabetes mellitus; COPD chronic  obstructive pulmonary disease

## 2020-11-05 NOTE — Assessment & Plan Note (Signed)
History of chronic active disease and even recurrent disease.  Resistant Avastin and Eylea in the past.  Responsive only to Lucentis 0.5.  Region temporal to the fovea will need to continue to monitor and continue to control using quarterly Lucentis today and evaluate again in approximate 3 months

## 2020-11-05 NOTE — Assessment & Plan Note (Signed)
No signs of CNVM OD 

## 2020-12-04 ENCOUNTER — Other Ambulatory Visit: Payer: Self-pay | Admitting: Interventional Cardiology

## 2020-12-04 DIAGNOSIS — I480 Paroxysmal atrial fibrillation: Secondary | ICD-10-CM

## 2020-12-04 NOTE — Telephone Encounter (Signed)
Eliquis 5mg  refill request received. Patient is 80 years old, weight-81.6kg, Crea-0.85 on 08/07/2020, Diagnosis-Afib, and last seen by Dr. 08/09/2020 on 05/07/2020 and Dr. 05/09/2020 on 05/03/2020. Dose is appropriate based on dosing criteria. Will send in refill to requested pharmacy.

## 2020-12-23 ENCOUNTER — Inpatient Hospital Stay (HOSPITAL_COMMUNITY)
Admission: EM | Admit: 2020-12-23 | Discharge: 2020-12-25 | DRG: 308 | Disposition: A | Discharge status: Home / Self Care | Payer: Medicare Other | Attending: Internal Medicine | Admitting: Internal Medicine

## 2020-12-23 ENCOUNTER — Encounter (HOSPITAL_COMMUNITY): Payer: Self-pay | Admitting: Emergency Medicine

## 2020-12-23 ENCOUNTER — Emergency Department (HOSPITAL_COMMUNITY): Payer: Medicare Other

## 2020-12-23 ENCOUNTER — Other Ambulatory Visit: Payer: Self-pay

## 2020-12-23 DIAGNOSIS — M546 Pain in thoracic spine: Secondary | ICD-10-CM | POA: Diagnosis present

## 2020-12-23 DIAGNOSIS — J9811 Atelectasis: Secondary | ICD-10-CM | POA: Diagnosis present

## 2020-12-23 DIAGNOSIS — R52 Pain, unspecified: Secondary | ICD-10-CM

## 2020-12-23 DIAGNOSIS — I7100 Dissection of unspecified site of aorta: Secondary | ICD-10-CM | POA: Diagnosis present

## 2020-12-23 DIAGNOSIS — I959 Hypotension, unspecified: Secondary | ICD-10-CM | POA: Diagnosis present

## 2020-12-23 DIAGNOSIS — M542 Cervicalgia: Secondary | ICD-10-CM | POA: Diagnosis present

## 2020-12-23 DIAGNOSIS — Z20822 Contact with and (suspected) exposure to covid-19: Secondary | ICD-10-CM | POA: Diagnosis present

## 2020-12-23 DIAGNOSIS — R739 Hyperglycemia, unspecified: Secondary | ICD-10-CM | POA: Diagnosis present

## 2020-12-23 DIAGNOSIS — M549 Dorsalgia, unspecified: Secondary | ICD-10-CM

## 2020-12-23 DIAGNOSIS — G4733 Obstructive sleep apnea (adult) (pediatric): Secondary | ICD-10-CM | POA: Diagnosis not present

## 2020-12-23 DIAGNOSIS — I4821 Permanent atrial fibrillation: Principal | ICD-10-CM | POA: Diagnosis present

## 2020-12-23 DIAGNOSIS — M7918 Myalgia, other site: Secondary | ICD-10-CM

## 2020-12-23 DIAGNOSIS — I4891 Unspecified atrial fibrillation: Secondary | ICD-10-CM | POA: Diagnosis not present

## 2020-12-23 DIAGNOSIS — Z841 Family history of disorders of kidney and ureter: Secondary | ICD-10-CM

## 2020-12-23 DIAGNOSIS — Z888 Allergy status to other drugs, medicaments and biological substances status: Secondary | ICD-10-CM

## 2020-12-23 DIAGNOSIS — Z8262 Family history of osteoporosis: Secondary | ICD-10-CM

## 2020-12-23 DIAGNOSIS — Z96653 Presence of artificial knee joint, bilateral: Secondary | ICD-10-CM | POA: Diagnosis present

## 2020-12-23 DIAGNOSIS — Z823 Family history of stroke: Secondary | ICD-10-CM

## 2020-12-23 DIAGNOSIS — Z833 Family history of diabetes mellitus: Secondary | ICD-10-CM

## 2020-12-23 DIAGNOSIS — E871 Hypo-osmolality and hyponatremia: Secondary | ICD-10-CM | POA: Diagnosis present

## 2020-12-23 DIAGNOSIS — Z7901 Long term (current) use of anticoagulants: Secondary | ICD-10-CM

## 2020-12-23 LAB — RESP PANEL BY RT-PCR (FLU A&B, COVID) ARPGX2
Influenza A by PCR: NEGATIVE
Influenza B by PCR: NEGATIVE
SARS Coronavirus 2 by RT PCR: NEGATIVE

## 2020-12-23 LAB — CBC WITH DIFFERENTIAL/PLATELET
Abs Immature Granulocytes: 0.04 10*3/uL (ref 0.00–0.07)
Basophils Absolute: 0 10*3/uL (ref 0.0–0.1)
Basophils Relative: 0 %
Eosinophils Absolute: 0 10*3/uL (ref 0.0–0.5)
Eosinophils Relative: 0 %
HCT: 46.2 % — ABNORMAL HIGH (ref 36.0–46.0)
Hemoglobin: 15.2 g/dL — ABNORMAL HIGH (ref 12.0–15.0)
Immature Granulocytes: 0 %
Lymphocytes Relative: 6 %
Lymphs Abs: 0.6 10*3/uL — ABNORMAL LOW (ref 0.7–4.0)
MCH: 30 pg (ref 26.0–34.0)
MCHC: 32.9 g/dL (ref 30.0–36.0)
MCV: 91.3 fL (ref 80.0–100.0)
Monocytes Absolute: 0.7 10*3/uL (ref 0.1–1.0)
Monocytes Relative: 6 %
Neutro Abs: 9.2 10*3/uL — ABNORMAL HIGH (ref 1.7–7.7)
Neutrophils Relative %: 88 %
Platelets: 193 10*3/uL (ref 150–400)
RBC: 5.06 MIL/uL (ref 3.87–5.11)
RDW: 13.9 % (ref 11.5–15.5)
WBC: 10.5 10*3/uL (ref 4.0–10.5)
nRBC: 0 % (ref 0.0–0.2)

## 2020-12-23 LAB — COMPREHENSIVE METABOLIC PANEL
ALT: 20 U/L (ref 0–44)
AST: 20 U/L (ref 15–41)
Albumin: 3.7 g/dL (ref 3.5–5.0)
Alkaline Phosphatase: 76 U/L (ref 38–126)
Anion gap: 13 (ref 5–15)
BUN: 19 mg/dL (ref 8–23)
CO2: 22 mmol/L (ref 22–32)
Calcium: 9 mg/dL (ref 8.9–10.3)
Chloride: 98 mmol/L (ref 98–111)
Creatinine, Ser: 0.66 mg/dL (ref 0.44–1.00)
GFR, Estimated: >60 mL/min (ref >60)
Glucose, Bld: 227 mg/dL — ABNORMAL HIGH (ref 70–99)
Potassium: 3.9 mmol/L (ref 3.5–5.1)
Sodium: 133 mmol/L — ABNORMAL LOW (ref 135–145)
Total Bilirubin: 0.9 mg/dL (ref 0.3–1.2)
Total Protein: 6.3 g/dL — ABNORMAL LOW (ref 6.5–8.1)

## 2020-12-23 LAB — TROPONIN I (HIGH SENSITIVITY)
Troponin I (High Sensitivity): <2 ng/L (ref <18)
Troponin I (High Sensitivity): 2 ng/L (ref <18)

## 2020-12-23 LAB — BRAIN NATRIURETIC PEPTIDE: B Natriuretic Peptide: 190.9 pg/mL — ABNORMAL HIGH (ref 0.0–100.0)

## 2020-12-23 LAB — CBG MONITORING, ED: Glucose-Capillary: 175 mg/dL — ABNORMAL HIGH (ref 70–99)

## 2020-12-23 MED ORDER — HYDROCODONE-ACETAMINOPHEN 5-325 MG PO TABS
1.0000 | ORAL_TABLET | ORAL | Status: DC | PRN
  Administered 2020-12-23 – 2020-12-24 (×2): 1 via ORAL
  Filled 2020-12-23 (×2): qty 1

## 2020-12-23 MED ORDER — CYCLOBENZAPRINE HCL 5 MG PO TABS
5.0000 mg | ORAL_TABLET | Freq: Every day | ORAL | Status: DC
  Administered 2020-12-23: 5 mg via ORAL
  Filled 2020-12-23: qty 1

## 2020-12-23 MED ORDER — ACETAMINOPHEN 325 MG PO TABS: 650.0000 mg | ORAL_TABLET | Freq: Four times a day (QID) | ORAL | Status: DC | PRN

## 2020-12-23 MED ORDER — METOPROLOL TARTRATE 25 MG PO TABS
25.0000 mg | ORAL_TABLET | Freq: Two times a day (BID) | ORAL | Status: DC
  Administered 2020-12-23: 25 mg via ORAL
  Filled 2020-12-23: qty 1

## 2020-12-23 MED ORDER — APIXABAN 5 MG PO TABS
5.0000 mg | ORAL_TABLET | Freq: Two times a day (BID) | ORAL | Status: DC
  Administered 2020-12-23 – 2020-12-25 (×4): 5 mg via ORAL
  Filled 2020-12-23 (×5): qty 1

## 2020-12-23 MED ORDER — INSULIN ASPART 100 UNIT/ML IJ SOLN
0.0000 [IU] | Freq: Three times a day (TID) | INTRAMUSCULAR | Status: DC
  Administered 2020-12-24: 1 [IU] via SUBCUTANEOUS
  Filled 2020-12-23: qty 0.09

## 2020-12-23 MED ORDER — SODIUM CHLORIDE 0.9 % IV BOLUS
500.0000 mL | Freq: Once | INTRAVENOUS | Status: AC
  Administered 2020-12-23: 500 mL via INTRAVENOUS

## 2020-12-23 MED ORDER — FENTANYL CITRATE (PF) 100 MCG/2ML IJ SOLN
50.0000 ug | INTRAMUSCULAR | Status: AC | PRN
  Administered 2020-12-23 (×3): 50 ug via INTRAVENOUS
  Filled 2020-12-23 (×4): qty 2

## 2020-12-23 MED ORDER — ONDANSETRON HCL 4 MG/2ML IJ SOLN
4.0000 mg | Freq: Once | INTRAMUSCULAR | Status: AC
  Administered 2020-12-23: 4 mg via INTRAVENOUS
  Filled 2020-12-23: qty 2

## 2020-12-23 MED ORDER — DILTIAZEM HCL-DEXTROSE 125-5 MG/125ML-% IV SOLN (PREMIX)
5.0000 mg/h | INTRAVENOUS | Status: DC
  Administered 2020-12-23: 5 mg/h via INTRAVENOUS
  Filled 2020-12-23: qty 125

## 2020-12-23 MED ORDER — MORPHINE SULFATE (PF) 2 MG/ML IV SOLN
1.0000 mg | Freq: Once | INTRAVENOUS | Status: AC
  Administered 2020-12-23: 1 mg via INTRAVENOUS
  Filled 2020-12-23: qty 1

## 2020-12-23 MED ORDER — CALCIUM CITRATE-VITAMIN D 500-500 MG-UNIT PO CHEW: 1.0000 | CHEWABLE_TABLET | Freq: Every day | ORAL | Status: DC

## 2020-12-23 MED ORDER — MORPHINE SULFATE (PF) 2 MG/ML IV SOLN: 1.0000 mg | INTRAVENOUS | Status: DC | PRN

## 2020-12-23 MED ORDER — CHOLECALCIFEROL 10 MCG (400 UNIT) PO TABS
400.0000 [IU] | ORAL_TABLET | Freq: Every day | ORAL | Status: DC
  Administered 2020-12-23 – 2020-12-24 (×2): 400 [IU] via ORAL
  Filled 2020-12-23 (×2): qty 1

## 2020-12-23 MED ORDER — IOHEXOL 350 MG/ML SOLN
80.0000 mL | Freq: Once | INTRAVENOUS | Status: AC | PRN
  Administered 2020-12-23: 80 mL via INTRAVENOUS

## 2020-12-23 MED ORDER — DILTIAZEM HCL 25 MG/5ML IV SOLN
10.0000 mg | Freq: Once | INTRAVENOUS | Status: AC
  Administered 2020-12-23: 10 mg via INTRAVENOUS
  Filled 2020-12-23: qty 5

## 2020-12-23 MED ORDER — POLYVINYL ALCOHOL 1.4 % OP SOLN
1.0000 [drp] | Freq: Two times a day (BID) | OPHTHALMIC | Status: DC
  Administered 2020-12-24 – 2020-12-25 (×3): 1 [drp] via OPHTHALMIC
  Filled 2020-12-23 (×2): qty 15

## 2020-12-23 MED ORDER — CALCIUM CITRATE 950 (200 CA) MG PO TABS
200.0000 mg | ORAL_TABLET | Freq: Every day | ORAL | Status: DC
  Administered 2020-12-23 – 2020-12-24 (×2): 200 mg via ORAL
  Filled 2020-12-23 (×2): qty 1

## 2020-12-23 NOTE — H&P (Addendum)
History and Physical    Rebecca Short:[id number] DOB: 29-May-1940 DOA: 12/23/2020  PCP: Daisy Floro, MD  Patient coming from: Home, husband at bedside  I have personally briefly reviewed patient's old medical records in [facility]  Chief Complaint: neck and back pain  HPI: Rebecca Short is a 80 y.o. female with medical history significant for permanent atrial fibrillation, macular degeneration, OSA on CPAP, and obesity who presents with concerns of severe neck and back pain.  Patient reports that for the past several days she has noticed severe neck and back pain worse with deep respiration.  Pain starts at the base of her occiput and goes down her mid spine to her thoracic back.  She thinks perhaps a week ago she might have done more exercise than she usually does.  She normally uses weightlifting machines.  She reports having pain with any movement of her neck and body.  Denies any upper extremity numbness or tingling.  Denies any headache or vision changes.  She denies any chest pain or shortness of breath.  No nausea, vomiting or diarrhea.  Notes that she has been under a lot of stress then past 6 months and was tearful.   ED Course: She was afebrile but noted to be in atrial fibrillation with RVR with rates up to 160 and normotensive on room air. Troponin negative x 2. She had extensive work-up with CTA chest and abdomen showed no pulmonary embolism, dissection or aneurysm.  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 No leukocytosis, hemoglobin of 15.2.  BG of 227.  Cardioversion was offered to patient by EDP Dr. Silverio Lay but pt declined.  She was given IV diltiazem bolus and initiated on a diltiazem infusion with good rate control and hospitalist called for admission.  Review of Systems: Constitutional: No Weight Change, No Fever ENT/Mouth: No sore throat, No Rhinorrhea Eyes: No Eye Pain, No Vision Changes Cardiovascular: No  Chest Pain, no SOB Respiratory: No Cough, No Sputum Gastrointestinal: No Nausea, No Vomiting, No Diarrhea, No Constipation, No Pain Genitourinary: no Urinary Incontinence, No Urgency, No Flank Pain Musculoskeletal: No Arthralgias, + Myalgias Skin: No Skin Lesions, No Pruritus, Neuro: no Weakness, No Numbness, Psych: No Anxiety/Panic, No Depression, no decrease appetite Heme/Lymph: No Bruising, No Bleeding  Past Medical History:  Diagnosis Date   A-fib (HCC)    Allergy    Atrial fibrillation (HCC)    Macular degeneration    OSA (obstructive sleep apnea)    mild with total AHI 7.25/hr and severe during REM sleep at 35/hr now on CPAP at 6cm H2O    Past Surgical History:  Procedure Laterality Date   CHOLECYSTECTOMY     REPLACEMENT TOTAL KNEE BILATERAL       reports that she has never smoked. She has never used smokeless tobacco. She reports that she does not drink alcohol and does not use drugs. Social History  Allergies  Allergen Reactions   Condrolite [Glucosamine-Chondroitin-Msm] Rash   Glucosamine Rash   Glucosamine Forte [Nutritional Supplements] Rash   Glucosamine Sulfate-Msm Rash    Family History  Problem Relation Age of Onset   Osteoporosis Mother    Heart Problems Father    Stroke Father    Cancer Maternal Grandmother    Dementia Paternal Grandmother    Kidney failure Brother    Suicidality Other    Diabetes Brother    Heart attack Neg Hx      Prior to Admission  medications   Medication Sig Start Date End Date Taking? Authorizing Provider  calcium citrate-vitamin D (CITRACAL+D) 315-200 MG-UNIT per tablet Take 2 tablets by mouth daily.    [provider]  ELIQUIS 5 MG TABS tablet TAKE 1 TABLET BY MOUTH TWICE A DAY 12/04/20   Corky Crafts, MD  EPIPEN 2-PAK 0.3 MG/0.3ML SOAJ injection Inject 0.3 mg into the muscle as needed (anaphylaxis).  07/05/14   [provider]  metoprolol tartrate (LOPRESSOR) 25 MG tablet TAKE 1 TABLET BY MOUTH  TWICE A DAY 10/02/20   Corky Crafts, MD  metroNIDAZOLE (METROGEL) 0.75 % gel 1 APPLICATION APPLY ON THE SKIN TWICE A DAY APPLY TO FACE TWICE DAILY 03/16/18   [provider]  Multiple Vitamin (MULTIVITAMIN WITH MINERALS) TABS tablet Take 1 tablet by mouth daily.    [provider]  Multiple Vitamins-Minerals (PRESERVISION AREDS PO) Take 1 capsule by mouth 2 (two) times daily. Eye vitamins    [provider]  oxyCODONE (OXY IR/ROXICODONE) 5 MG immediate release tablet TAKE 1 TABLET (5 MG TOTAL) BY MOUTH EVERY 6 (SIX) HOURS AS NEEDED FOR UP TO 15 DOSES FOR SEVERE PAIN. 08/07/20 02/03/21  Terald Sleeper, MD  oxyCODONE (ROXICODONE) 5 MG immediate release tablet Take 1 tablet (5 mg total) by mouth every 6 (six) hours as needed for up to 15 doses for severe pain. 08/07/20   Terald Sleeper, MD    Physical Exam: Vitals:   12/23/20 1715 12/23/20 1730 12/23/20 1745 12/23/20 1800  BP:    (!) 107/57  Pulse: (!) 113 (!) 105 (!) 118 98  Resp: (!) 22 20 20  (!) 26  Temp:      TempSrc:      SpO2: 96% 95% 96% 95%    Constitutional: NAD, calm, comfortable, elderly female sitting upright in bed Vitals:   12/23/20 1715 12/23/20 1730 12/23/20 1745 12/23/20 1800  BP:    (!) 107/57  Pulse: (!) 113 (!) 105 (!) 118 98  Resp: (!) 22 20 20  (!) 26  Temp:      TempSrc:      SpO2: 96% 95% 96% 95%   Eyes: PERRL, lids and conjunctivae normal ENMT: Mucous membranes are moist.  Neck: normal, supple, pain with deep palpation of posterior cervical paraspinal musculature starting at the base of the occiput.  Limited active range of motion which likely is her baseline.  Notes constant pain to posterior cervical region with active range of motion, worse with left rotation. Respiratory: clear to auscultation bilaterally, no wheezing, no crackles. Normal respiratory effort. No accessory muscle use.  Cardiovascular: Regular rate and rhythm, no murmurs / rubs / gallops. No extremity edema.   Abdomen: no tenderness, no masses palpated. No hepatosplenomegaly. Bowel sounds positive.  Musculoskeletal: no clubbing / cyanosis. No joint deformity upper and lower extremities.  Normal muscle tone.  Denies pain with palpation of the mid thoracic spine. Skin: no rashes, lesions, ulcers. No induration Neurologic: CN 2-12 grossly intact. Sensation intact, Strength 5/5 in all 4.  Psychiatric: Normal judgment and insight. Alert and oriented x 3. Normal mood.     Labs on Admission: I have personally reviewed following labs and imaging studies  CBC: Recent Labs  Lab 12/23/20 1334  WBC 10.5  NEUTROABS 9.2*  HGB 15.2*  HCT 46.2*  MCV 91.3  PLT 193   Basic Metabolic Panel: Recent Labs  Lab 12/23/20 1334  NA 133*  K 3.9  CL 98  CO2 22  GLUCOSE 227*  BUN 19  CREATININE 0.66  CALCIUM 9.0   GFR: CrCl cannot be calculated (Unknown ideal weight.). Liver Function Tests: Recent Labs  Lab 12/23/20 1334  AST 20  ALT 20  ALKPHOS 76  BILITOT 0.9  PROT 6.3*  ALBUMIN 3.7   No results for input(s): LIPASE, AMYLASE in the last 168 hours. No results for input(s): AMMONIA in the last 168 hours. Coagulation Profile: No results for input(s): INR, PROTIME in the last 168 hours. Cardiac Enzymes: No results for input(s): CKTOTAL, CKMB, CKMBINDEX, TROPONINI in the last 168 hours. BNP (last 3 results) No results for input(s): PROBNP in the last 8760 hours. HbA1C: No results for input(s): HGBA1C in the last 72 hours. CBG: No results for input(s): GLUCAP in the last 168 hours. Lipid Profile: No results for input(s): CHOL, HDL, LDLCALC, TRIG, CHOLHDL, LDLDIRECT in the last 72 hours. Thyroid Function Tests: No results for input(s): TSH, T4TOTAL, FREET4, T3FREE, THYROIDAB in the last 72 hours. Anemia Panel: No results for input(s): VITAMINB12, FOLATE, FERRITIN, TIBC, IRON, RETICCTPCT in the last 72 hours. Urine analysis:    Component Value Date/Time   COLORURINE YELLOW 07/15/2013  1004   APPEARANCEUR CLEAR 07/15/2013 1004   LABSPEC 1.015 07/15/2013 1004   PHURINE 7.5 07/15/2013 1004   GLUCOSEU NEGATIVE 07/15/2013 1004   HGBUR NEGATIVE 07/15/2013 1004   BILIRUBINUR NEGATIVE 07/15/2013 1004   KETONESUR NEGATIVE 07/15/2013 1004   PROTEINUR NEGATIVE 07/15/2013 1004   UROBILINOGEN 0.2 07/15/2013 1004   NITRITE NEGATIVE 07/15/2013 1004   LEUKOCYTESUR NEGATIVE 07/15/2013 1004    Radiological Exams on Admission: CT L-SPINE NO CHARGE  Result Date: 12/23/2020 CLINICAL DATA:  Low back and abdominal pain for approximately 2 days. No known injury. EXAM: CT Lumbar Spine with contrast TECHNIQUE: Technique: Multiplanar CT images of the lumbar spine were reconstructed from contemporary CT of the Abdomen and Pelvis. CONTRAST:  No additional. COMPARISON:  Plain films lumbar spine 01/31/2015. FINDINGS: Segmentation: The patient has transitional lumbosacral anatomy with sacralization of the lowest lumbar segment on the right. Alignment: No listhesis. Convex left scoliosis with the apex at L2-3. Vertebrae: There is a Schmorl's node versus central inferior endplate compression fracture L4. No other finding to suggest fracture. No worrisome bony lesion. Paraspinal and other soft tissues: See report of CT angiogram of the chest, abdomen and pelvis today. 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 Degenerative change appears worst at L3-4 and L4-5 where there is ligamentum flavum thickening and disc bulging. Facet arthropathy is also present at these levels. Moderately severe to severe central canal stenosis at both levels is worse at L4-5. IMPRESSION: Defect in the inferior endplate of L4 cannot be definitively characterized but appears chronic and is favored to represent a Schmorl's node rather than a compression fracture. Lumbar spondylosis appears worst at L3-4 and L4-5 where there is moderately severe to severe central canal stenosis, worse at L4-5. Electronically Signed    By: Drusilla Kanner M.D.   On: 12/23/2020 16:26   DG Chest Portable 1 View  Result Date: 12/23/2020 CLINICAL DATA:  Shortness of breath. Back pain since exercising 2 days ago. Now central chest pain. History of atrial fibrillation. EXAM: PORTABLE CHEST 1 VIEW COMPARISON:  Oct 04, 2013 FINDINGS: Cardiomegaly. The hila and mediastinum are unchanged. No pneumothorax. The right lung is clear. Opacity in left base is chronic in appearance, consistent with scarring or chronic atelectasis. No acute infiltrate identified. IMPRESSION: Chronic changes in the left base.  No acute abnormalities. Electronically Signed  By: Gerome Sam III M.D   On: 12/23/2020 13:44   CT Angio Chest/Abd/Pel for Dissection W and/or Wo Contrast  Result Date: 12/23/2020 CLINICAL DATA:  Abdominal pain, aortic dissection suspected EXAM: CT ANGIOGRAPHY CHEST, ABDOMEN AND PELVIS TECHNIQUE: Non-contrast CT of the chest was initially obtained. Multidetector CT imaging through the chest, abdomen and pelvis was performed using the standard protocol during bolus administration of intravenous contrast. Multiplanar reconstructed images and MIPs were obtained and reviewed to evaluate the vascular anatomy. CONTRAST:  80mL OMNIPAQUE IOHEXOL 350 MG/ML SOLN COMPARISON:  01/10/2010 FINDINGS: CTA CHEST FINDINGS Cardiovascular: Cardiomegaly. Aorta normal caliber. No dissection. No filling defects in the pulmonary arteries to suggest pulmonary emboli. Mediastinum/Nodes: No mediastinal, hilar, or axillary adenopathy. Trachea and esophagus are unremarkable. Thyroid unremarkable. Lungs/Pleura: Left lower lobe atelectasis or infiltrate/pneumonia. Minimal right base atelectasis. No effusions. Musculoskeletal: Chest wall soft tissues are unremarkable. No acute bony abnormality. Review of the MIP images confirms the above findings. CTA ABDOMEN AND PELVIS FINDINGS VASCULAR Aorta: Scattered infrarenal aortic calcifications. No aneurysm or dissection. Celiac:  Patent SMA: Patent Renals: Patent IMA: Patent Inflow: Scattered calcifications.  No aneurysm or dissection. Veins: Choose 1 Review of the MIP images confirms the above findings. NON-VASCULAR Hepatobiliary: Prior cholecystectomy. Prominent common bile duct likely related to post cholecystectomy state and patient's age, measuring up to 18 mm. No intrahepatic biliary ductal dilatation or focal hepatic abnormality. Pancreas: No focal abnormality or ductal dilatation. Spleen: No focal abnormality.  Normal size. Adrenals/Urinary Tract: Small scattered renal cysts. No renal or ureteral stones. No hydronephrosis. Adrenal glands and urinary bladder unremarkable. Stomach/Bowel: Diffuse colonic diverticulosis. Stomach and small bowel decompressed. Lymphatic: No adenopathy Reproductive: Calcified fibroids within the pelvis. No adnexal mass. Other: No free fluid or free air. Musculoskeletal: No acute bony abnormality. Degenerative changes throughout the lumbar spine. Review of the MIP images confirms the above findings. IMPRESSION: No aortic aneurysm or dissection. No evidence of pulmonary embolus. Cardiomegaly. Right base atelectasis.  Left lower lobe atelectasis or infiltrate. Diffuse colonic diverticulosis.  No active diverticulitis. Uterine fibroids. Electronically Signed   By: Charlett Nose M.D.   On: 12/23/2020 16:38      Assessment/Plan  Neck/back pain likely MSK  -pt had reproducible pain on palpation and with range of motion -CTA chest and abdomen ruled out PE, dissection or aneurysm -previous CT cervical spine shows on DDD -Apply K pad -Low-dose hydrocodone for mild pain, IV morphine for severe pain. Avoiding NSAIDS due to her a.fib -PT evaluation tomorrow to offer stretching regimen  Permanent atrial fibrillation with RVR on Eliquis -Troponin negative x2.  Patient also asymptomatic from cardiac standpoint.  Low suspicion for ACS. Could be due to increase stress. -Patient offered cardioversion in ED but  declined -Check morning TSH -Last echocardiogram in 2015 with EF of 55 to 60% and grade 1 diastolic dysfunction.  Will repeat. -Initially on diltiazem infusion but later became hypotensive- infusion has been d/c. Will hold home metoprolol for now-rate controlled -continue Eliquis  Hypotension --Initially on diltiazem infusion for a.fib but later became hypotensive- infusion has been d/c. Will hold home metoprolol for now  Hyperglycemia - BG of 227 - Obtain hemoglobin A1c and add sensitive sliding scale  OSA - Continue CPAP  DVT prophylaxis:.Eliquis Code Status: Full Family Communication: Plan discussed with husband at bedside  disposition Plan: Home with observation Consults called:  Admission status: Observation     Level of care: Progressive  Status is: Observation  The patient remains OBS appropriate and will d/c  before 2 midnights.  Dispo: The patient is from: Home              Anticipated d/c is to: Home              Patient currently is not medically stable to d/c.   Difficult to place patient No         Anselm Jungling DO [facility]   If 7PM-7AM, please contact night-coverage [email or web address]   12/23/2020, 7:22 PM

## 2020-12-23 NOTE — ED Triage Notes (Signed)
Patient c/o neck and back pain since exercise x2 days ago. Denies urinary symptoms.

## 2020-12-23 NOTE — ED Provider Notes (Signed)
  Physical Exam  BP 131/64   Pulse (!) 118   Temp 98.4 F (36.9 C) (Oral)   Resp 20   SpO2 96%   Physical Exam  ED Course/Procedures     Procedures  MDM  Care assumed at 3 PM from Dr. Effie Shy.  Patient is here with chest pain and back pain and rapid A. Fib.  Patient is on Eliquis for A. fib.  Signout pending dissection study and reassessment  6:11 PM Patient's dissection study is negative.  Patient has some atelectasis.  White blood cell count is normal and patient has no signs of pneumonia.  I think symptoms likely from rapid A. fib.  Patient's heart rate is still in the 140s when I reassessed patient.  Given Cardizem bolus but heart rate went down to 120s.  Started on Cardizem drip.  I offered cardioversion since she is on Eliquis.  However she states that she had a discussion with Dr. Eldridge Dace who is her cardiologist before about this and she does not want to be cardioverted.  At this point, will admit for rapid A. fib.  CRITICAL CARE Performed by: Richardean Canal   Total critical care time:30 minutes  Critical care time was exclusive of separately billable procedures and treating other patients.  Critical care was necessary to treat or prevent imminent or life-threatening deterioration.  Critical care was time spent personally by me on the following activities: development of treatment plan with patient and/or surrogate as well as nursing, discussions with consultants, evaluation of patient's response to treatment, examination of patient, obtaining history from patient or surrogate, ordering and performing treatments and interventions, ordering and review of laboratory studies, ordering and review of radiographic studies, pulse oximetry and re-evaluation of patient's condition.    Charlynne Pander, MD 12/23/20 [phone]

## 2020-12-23 NOTE — ED Provider Notes (Signed)
Emergency Medicine Provider Triage Evaluation Note  Rebecca Short , a 80 y.o. female  was evaluated in triage.  Pt complains of back pain that started about 2 days ago.  Patient states that she feels that she exercised more than normal prior to the onset of her symptoms.  Her pain was initially mild but has worsened for the past 2 days.  Worsens with movement as well as when lying flat on her back.  Reports pain with deep breathing and feels that her pain is also now starting to move forward to the central portion of her chest.  No urinary complaints.  She reports chronic edema in the lower extremities as well.  Denies a history of known heart failure.  Physical Exam  BP 129/79 (BP Location: Left Arm)   Pulse 98   Temp 98.4 F (36.9 C) (Oral)   Resp 18   SpO2 94%  Gen:   Awake, no distress   Resp:  Normal effort  MSK:   Moves extremities without difficulty  Other:  Mild to moderate pitting edema noted in the bilateral lower extremities.  No palpable tenderness appreciated along the cervical or thoracic spine.  Medical Decision Making  Medically screening exam initiated at 1:12 PM.  Appropriate orders placed.  AASHI DERRINGTON was informed that the remainder of the evaluation will be completed by another provider, this initial triage assessment does not replace that evaluation, and the importance of remaining in the ED until their evaluation is complete.   Placido Sou, PA-C 12/23/20 1313    Rolan Bucco, MD 12/23/20 1536

## 2020-12-23 NOTE — ED Provider Notes (Signed)
[facility]-EMERGENCY DEPT Provider Note   CSN: [id number] Arrival date & time: 12/23/20  1245     History Chief Complaint  Patient presents with   Back Pain   Neck Pain    Rebecca Short is a 80 y.o. female.  HPI She presents for evaluation of mid back pain which started spontaneously 2 days ago after doing her usual exercises.  She does not feel she did anything different.  No other trauma.  Pain is worse with movement, deep breathing or twisting.  Pain is constant.  She has not tried to take anything for it.  She is on Eliquis for atrial fibrillation.  She denies fever, chills, nausea, vomiting.  She is able to ambulate.  No prior similar problems.  There are no other known active modifying factors.    Past Medical History:  Diagnosis Date   A-fib [facility])    Allergy    Atrial fibrillation (HCC)    Macular degeneration    OSA (obstructive sleep apnea)    mild with total AHI 7.25/hr and severe during REM sleep at 35/hr now on CPAP at 6cm H2O    Patient Active Problem List   Diagnosis Date Noted   Posterior vitreous detachment of both eyes 04/30/2020   Macular pucker, right eye 11/08/2019   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HCC) 11/08/2019   Intermediate stage nonexudative age-related macular degeneration of both eyes 11/08/2019   Anticoagulated 11/18/2016   Bradycardia 03/23/2014   OSA (obstructive sleep apnea) 01/19/2014   Obesity (BMI 30-39.9) 01/19/2014   Paroxysmal A-fib (HCC) 10/04/2013   Nonspecific abnormal unspecified cardiovascular function study 07/21/2013   Acute gastroenteritis 07/15/2013    Past Surgical History:  Procedure Laterality Date   CHOLECYSTECTOMY     REPLACEMENT TOTAL KNEE BILATERAL       OB History   No obstetric history on file.     Family History  Problem Relation Age of Onset   Osteoporosis Mother    Heart Problems Father    Stroke Father    Cancer  Maternal Grandmother    Dementia Paternal Grandmother    Kidney failure Brother    Suicidality Other    Diabetes Brother    Heart attack Neg Hx     Social History   Tobacco Use   Smoking status: Never   Smokeless tobacco: Never  Vaping Use   Vaping Use: Never used  Substance Use Topics   Alcohol use: Never   Drug use: Never    Home Medications Prior to Admission medications   Medication Sig Start Date End Date Taking? Authorizing Provider  calcium citrate-vitamin D (CITRACAL+D) 315-200 MG-UNIT per tablet Take 2 tablets by mouth daily.    [provider]  ELIQUIS 5 MG TABS tablet TAKE 1 TABLET BY MOUTH TWICE A DAY 12/04/20   Corky Crafts, MD  EPIPEN 2-PAK 0.3 MG/0.3ML SOAJ injection Inject 0.3 mg into the muscle as needed (anaphylaxis).  07/05/14   [provider]  metoprolol tartrate (LOPRESSOR) 25 MG tablet TAKE 1 TABLET BY MOUTH TWICE A DAY 10/02/20   Corky Crafts, MD  metroNIDAZOLE (METROGEL) 0.75 % gel 1 APPLICATION APPLY ON THE SKIN TWICE A DAY APPLY TO FACE TWICE DAILY 03/16/18   [provider]  Multiple Vitamin (MULTIVITAMIN WITH MINERALS) TABS tablet Take 1 tablet by mouth daily.    [provider]  Multiple Vitamins-Minerals (PRESERVISION AREDS PO) Take 1 capsule by mouth 2 (  two) times daily. Eye vitamins    [provider]  oxyCODONE (OXY IR/ROXICODONE) 5 MG immediate release tablet TAKE 1 TABLET (5 MG TOTAL) BY MOUTH EVERY 6 (SIX) HOURS AS NEEDED FOR UP TO 15 DOSES FOR SEVERE PAIN. 08/07/20 02/03/21  Terald Sleeper, MD  oxyCODONE (ROXICODONE) 5 MG immediate release tablet Take 1 tablet (5 mg total) by mouth every 6 (six) hours as needed for up to 15 doses for severe pain. 08/07/20   Terald Sleeper, MD    Allergies    Condrolite [glucosamine-chondroitin-msm], Glucosamine, Glucosamine forte [nutritional supplements], and Glucosamine sulfate-msm  Review of Systems   Review of Systems  All other systems  reviewed and are negative.  Physical Exam Updated Vital Signs BP 102/75 (BP Location: Right Arm)   Pulse (!) 108   Temp 98.4 F (36.9 C) (Oral)   Resp (!) 27   SpO2 95%   Physical Exam Vitals and nursing note reviewed.  Constitutional:      General: She is in acute distress (Uncomfortable).     Appearance: She is well-developed. She is not ill-appearing.  HENT:     Head: Normocephalic and atraumatic.     Right Ear: External ear normal.     Left Ear: External ear normal.  Eyes:     Conjunctiva/sclera: Conjunctivae normal.     Pupils: Pupils are equal, round, and reactive to light.  Neck:     Trachea: Phonation normal.  Cardiovascular:     Rate and Rhythm: Normal rate and regular rhythm.     Heart sounds: Normal heart sounds.  Pulmonary:     Effort: Pulmonary effort is normal.     Breath sounds: Normal breath sounds.  Abdominal:     General: There is no distension.     Palpations: Abdomen is soft. There is no mass.     Tenderness: There is no abdominal tenderness.     Comments: No abdominal pulsations on exam.  Musculoskeletal:        General: Normal range of motion.     Cervical back: Normal range of motion and neck supple.     Comments: Severe mid back pain with effort to flex, from supine position.  She is able to roll over for examination of her back, no palpable tenderness of the thoracic or lumbar spines.  Skin:    General: Skin is warm and dry.  Neurological:     Mental Status: She is alert and oriented to person, place, and time.     Cranial Nerves: No cranial nerve deficit.     Sensory: No sensory deficit.     Motor: No abnormal muscle tone.     Coordination: Coordination normal.  Psychiatric:        Mood and Affect: Mood normal.        Behavior: Behavior normal.        Thought Content: Thought content normal.        Judgment: Judgment normal.    ED Results / Procedures / Treatments   Labs (all labs ordered are listed, but only abnormal results are  displayed) Labs Reviewed  COMPREHENSIVE METABOLIC PANEL - Abnormal; Notable for the following components:      Result Value   Sodium 133 (*)    Glucose, Bld 227 (*)    Total Protein 6.3 (*)    All other components within normal limits  BRAIN NATRIURETIC PEPTIDE - Abnormal; Notable for the following components:   B Natriuretic Peptide 190.9 (*)  All other components within normal limits  CBC WITH DIFFERENTIAL/PLATELET - Abnormal; Notable for the following components:   Hemoglobin 15.2 (*)    HCT 46.2 (*)    Neutro Abs 9.2 (*)    Lymphs Abs 0.6 (*)    All other components within normal limits  TROPONIN I (HIGH SENSITIVITY)  TROPONIN I (HIGH SENSITIVITY)    EKG EKG Interpretation  Date/Time:  Sunday December 23 2020 13:55:47 EDT Ventricular Rate:  124 PR Interval:    QRS Duration: 95 QT Interval:  274 QTC Calculation: 364 R Axis:   23 Text Interpretation: Atrial fibrillation Low voltage, precordial leads Borderline repolarization abnormality Since last tracing now in rapid atrial fibrillation Confirmed by Mancel Bale [phone]) on 12/23/2020 2:31:56 PM  Radiology DG Chest Portable 1 View  Result Date: 12/23/2020 CLINICAL DATA:  Shortness of breath. Back pain since exercising 2 days ago. Now central chest pain. History of atrial fibrillation. EXAM: PORTABLE CHEST 1 VIEW COMPARISON:  Oct 04, 2013 FINDINGS: Cardiomegaly. The hila and mediastinum are unchanged. No pneumothorax. The right lung is clear. Opacity in left base is chronic in appearance, consistent with scarring or chronic atelectasis. No acute infiltrate identified. IMPRESSION: Chronic changes in the left base.  No acute abnormalities. Electronically Signed   By: Gerome Sam III M.D   On: 12/23/2020 13:44    Procedures .Critical Care  Date/Time: 12/23/2020 3:15 PM Performed by: Mancel Bale, MD Authorized by: Mancel Bale, MD   Critical care provider statement:    Critical care time (minutes):  35   Critical  care start time:  12/23/2020 2:25 PM   Critical care end time:  12/23/2020 3:15 PM   Critical care time was exclusive of:  Separately billable procedures and treating other patients   Critical care was necessary to treat or prevent imminent or life-threatening deterioration of the following conditions:  Circulatory failure   Critical care was time spent personally by me on the following activities:  Blood draw for specimens, development of treatment plan with patient or surrogate, discussions with consultants, evaluation of patient's response to treatment, examination of patient, obtaining history from patient or surrogate, ordering and performing treatments and interventions, ordering and review of laboratory studies, pulse oximetry, re-evaluation of patient's condition, review of old charts and ordering and review of radiographic studies   Medications Ordered in ED Medications  fentaNYL (SUBLIMAZE) injection 50 mcg (50 mcg Intravenous Given 12/23/20 1448)  sodium chloride 0.9 % bolus 500 mL (has no administration in time range)  ondansetron (ZOFRAN) injection 4 mg (4 mg Intravenous Given 12/23/20 1448)    ED Course  I have reviewed the triage vital signs and the nursing notes.  Pertinent labs & imaging results that were available during my care of the patient were reviewed by me and considered in my medical decision making (see chart for details).    MDM Rules/Calculators/A&P                            Patient Vitals for the past 24 hrs:  BP Temp Temp src Pulse Resp SpO2  12/23/20 1512 102/75 -- -- (!) 108 (!) 27 95 %  12/23/20 1253 129/79 98.4 F (36.9 C) Oral 98 18 94 %    Medical Decision Making:  This patient is presenting for evaluation of mid back pain, which does require a range of treatment options, and is a complaint that involves a high risk of morbidity and mortality. The  differential diagnoses include thoracic compression fracture, muscle strain, aortic dissection,  complication of atrial fibrillation, complications from anticoagulation medication.. I decided to review old records, and in summary elderly female presenting with atraumatic mid back pain, currently anticoagulated..  No known history of aortic problems. I did not require additional historical information from anyone.  Clinical Laboratory Tests Ordered, included CBC, Metabolic panel, and BNP, troponin . Review indicates normal except elevated BNP, sodium low, glucose high, total protein low, hemoglobin high. Radiologic Tests Ordered, included CT chest, abdomen, pelvis for dissection.    Cardiac Monitor Tracing which shows rapid atrial fibrillation   Critical Interventions-clinical evaluation, laboratory testing, IV fluids, IV pain medicine, CT imaging, observation  After These Interventions, the Patient was reevaluated and was found with nonspecific back pain, and anticoagulation.  Currently in rapid atrial fibrillation.  Unclear if this is contributing to the back discomfort, or secondary to it.  CRITICAL CARE-yes Performed by: Mancel Bale  Nursing Notes Reviewed/ Care Coordinated Applicable Imaging Reviewed Interpretation of Laboratory Data incorporated into ED treatment    Care to Dr. Silverio Lay, to arrange disposition following imaging and treatment.   Final Clinical Impression(s) / ED Diagnoses Final diagnoses:  Pain  Acute midline back pain, unspecified back location  Rapid atrial fibrillation (HCC)  Anticoagulated    Rx / DC Orders ED Discharge Orders     None        Mancel Bale, MD 12/23/20 1515

## 2020-12-24 ENCOUNTER — Encounter (HOSPITAL_COMMUNITY): Payer: Self-pay | Admitting: Family Medicine

## 2020-12-24 ENCOUNTER — Observation Stay (HOSPITAL_COMMUNITY): Payer: Medicare Other

## 2020-12-24 DIAGNOSIS — I4821 Permanent atrial fibrillation: Secondary | ICD-10-CM | POA: Diagnosis present

## 2020-12-24 DIAGNOSIS — I4891 Unspecified atrial fibrillation: Secondary | ICD-10-CM | POA: Diagnosis present

## 2020-12-24 DIAGNOSIS — Z841 Family history of disorders of kidney and ureter: Secondary | ICD-10-CM | POA: Diagnosis not present

## 2020-12-24 DIAGNOSIS — R739 Hyperglycemia, unspecified: Secondary | ICD-10-CM | POA: Diagnosis present

## 2020-12-24 DIAGNOSIS — Z20822 Contact with and (suspected) exposure to covid-19: Secondary | ICD-10-CM | POA: Diagnosis present

## 2020-12-24 DIAGNOSIS — Z833 Family history of diabetes mellitus: Secondary | ICD-10-CM | POA: Diagnosis not present

## 2020-12-24 DIAGNOSIS — M542 Cervicalgia: Secondary | ICD-10-CM | POA: Diagnosis present

## 2020-12-24 DIAGNOSIS — M7918 Myalgia, other site: Secondary | ICD-10-CM | POA: Diagnosis not present

## 2020-12-24 DIAGNOSIS — I959 Hypotension, unspecified: Secondary | ICD-10-CM | POA: Diagnosis present

## 2020-12-24 DIAGNOSIS — I7100 Dissection of unspecified site of aorta: Secondary | ICD-10-CM | POA: Diagnosis present

## 2020-12-24 DIAGNOSIS — J9811 Atelectasis: Secondary | ICD-10-CM | POA: Diagnosis present

## 2020-12-24 DIAGNOSIS — Z7901 Long term (current) use of anticoagulants: Secondary | ICD-10-CM | POA: Diagnosis not present

## 2020-12-24 DIAGNOSIS — M546 Pain in thoracic spine: Secondary | ICD-10-CM | POA: Diagnosis present

## 2020-12-24 DIAGNOSIS — G4733 Obstructive sleep apnea (adult) (pediatric): Secondary | ICD-10-CM | POA: Diagnosis present

## 2020-12-24 DIAGNOSIS — R52 Pain, unspecified: Secondary | ICD-10-CM | POA: Diagnosis present

## 2020-12-24 DIAGNOSIS — Z888 Allergy status to other drugs, medicaments and biological substances status: Secondary | ICD-10-CM | POA: Diagnosis not present

## 2020-12-24 DIAGNOSIS — E871 Hypo-osmolality and hyponatremia: Secondary | ICD-10-CM | POA: Diagnosis present

## 2020-12-24 DIAGNOSIS — Z96653 Presence of artificial knee joint, bilateral: Secondary | ICD-10-CM | POA: Diagnosis present

## 2020-12-24 DIAGNOSIS — Z823 Family history of stroke: Secondary | ICD-10-CM | POA: Diagnosis not present

## 2020-12-24 DIAGNOSIS — Z8262 Family history of osteoporosis: Secondary | ICD-10-CM | POA: Diagnosis not present

## 2020-12-24 LAB — GLUCOSE, CAPILLARY
Glucose-Capillary: 124 mg/dL — ABNORMAL HIGH (ref 70–99)
Glucose-Capillary: 103 mg/dL — ABNORMAL HIGH (ref 70–99)
Glucose-Capillary: 137 mg/dL — ABNORMAL HIGH (ref 70–99)

## 2020-12-24 LAB — CBG MONITORING, ED: Glucose-Capillary: 103 mg/dL — ABNORMAL HIGH (ref 70–99)

## 2020-12-24 LAB — BASIC METABOLIC PANEL
Anion gap: 3 — ABNORMAL LOW (ref 5–15)
BUN: 19 mg/dL (ref 8–23)
CO2: 26 mmol/L (ref 22–32)
Calcium: 8.1 mg/dL — ABNORMAL LOW (ref 8.9–10.3)
Chloride: 105 mmol/L (ref 98–111)
Creatinine, Ser: 0.6 mg/dL (ref 0.44–1.00)
GFR, Estimated: >60 mL/min (ref >60)
Glucose, Bld: 114 mg/dL — ABNORMAL HIGH (ref 70–99)
Potassium: 3.7 mmol/L (ref 3.5–5.1)
Sodium: 134 mmol/L — ABNORMAL LOW (ref 135–145)

## 2020-12-24 LAB — ECHOCARDIOGRAM COMPLETE: S' Lateral: 3.8 cm

## 2020-12-24 LAB — HEMOGLOBIN A1C
Hgb A1c MFr Bld: 5.4 % (ref 4.8–5.6)
Mean Plasma Glucose: 108.28 mg/dL

## 2020-12-24 LAB — TSH: TSH: 0.698 u[IU]/mL (ref 0.350–4.500)

## 2020-12-24 MED ORDER — METOPROLOL TARTRATE 25 MG PO TABS
25.0000 mg | ORAL_TABLET | Freq: Two times a day (BID) | ORAL | Status: DC
  Administered 2020-12-24 – 2020-12-25 (×3): 25 mg via ORAL
  Filled 2020-12-24 (×3): qty 1

## 2020-12-24 MED ORDER — PERFLUTREN LIPID MICROSPHERE
1.0000 mL | INTRAVENOUS | Status: AC | PRN
  Administered 2020-12-24: 2 mL via INTRAVENOUS
  Filled 2020-12-24: qty 10

## 2020-12-24 MED ORDER — DICLOFENAC SODIUM 1 % EX GEL
2.0000 g | Freq: Four times a day (QID) | CUTANEOUS | Status: DC
  Administered 2020-12-24 – 2020-12-25 (×3): 2 g via TOPICAL
  Filled 2020-12-24: qty 100

## 2020-12-24 MED ORDER — IBUPROFEN 200 MG PO TABS
200.0000 mg | ORAL_TABLET | Freq: Three times a day (TID) | ORAL | Status: DC
  Administered 2020-12-24 – 2020-12-25 (×3): 200 mg via ORAL
  Filled 2020-12-24 (×3): qty 1

## 2020-12-24 MED ORDER — CYCLOBENZAPRINE HCL 5 MG PO TABS
5.0000 mg | ORAL_TABLET | Freq: Three times a day (TID) | ORAL | Status: DC
  Administered 2020-12-24 – 2020-12-25 (×3): 5 mg via ORAL
  Filled 2020-12-24 (×3): qty 1

## 2020-12-24 MED ORDER — ACETAMINOPHEN 325 MG PO TABS
650.0000 mg | ORAL_TABLET | Freq: Four times a day (QID) | ORAL | Status: DC
  Administered 2020-12-24 – 2020-12-25 (×3): 650 mg via ORAL
  Filled 2020-12-24 (×3): qty 2

## 2020-12-24 NOTE — Progress Notes (Signed)
Pt placed on cpap and she is tolerating it well at this time.

## 2020-12-24 NOTE — Evaluation (Signed)
Physical Therapy Evaluation Patient Details Name: Rebecca Short MRN: [id number] DOB: [birth year] Today's Date: 12/24/2020   History of Present Illness  80 yo female admitted with Afib, neck/back pain. Hx of Afib, vertigo, mac deg, obesity  Clinical Impression  On eval, pt was mostly MIn guard for mobility. She walked ~120 feet with use of her cane. Neck/back pain rated 2/10-ran thru some cervical ROM exercises while sitting EOB. HR up to 125 bpm with ambulation-pt tolerated ambulation distance well. Will plan to follow and progress activity as tolerated.     Follow Up Recommendations Outpatient PT (pt would like to resume OP PT once she feels ready)    Equipment Recommendations  None recommended by PT    Recommendations for Other Services       Precautions / Restrictions Precautions Precautions: Fall Restrictions Weight Bearing Restrictions: No      Mobility  Bed Mobility Overal bed mobility: Needs Assistance Bed Mobility: Supine to Sit;Sit to Supine     Supine to sit: Supervision;HOB elevated Sit to supine: Min assist;HOB elevated   General bed mobility comments: Assist for LEs. Increased time.    Transfers Overall transfer level: Needs assistance Equipment used: Rolling walker (2 wheeled) Transfers: Sit to/from Stand Sit to Stand: Min guard         General transfer comment: Min guard for safety. Increased time.  Ambulation/Gait Ambulation/Gait assistance: Min guard Gait Distance (Feet): 120 Feet Assistive device: Straight cane Gait Pattern/deviations: Step-through pattern;Decreased stride length;Decreased step length - right;Decreased step length - left     General Gait Details: Slow gait speed. Pt tends to take small steps. HR 125 bm during ambuation. Pt denied dizziness; tolerated distance well  Stairs            Wheelchair Mobility    Modified Rankin (Stroke Patients Only)       Balance Overall balance assessment: Needs assistance;History of  Falls         Standing balance support: Single extremity supported Standing balance-Leahy Scale: Fair                               Pertinent Vitals/Pain Pain Assessment: 0-10 Pain Score: 2  Pain Location: neck, back Pain Descriptors / Indicators: Discomfort;Sore Pain Intervention(s): Limited activity within patient's tolerance;Monitored during session;Repositioned;Heat applied    Home Living Family/patient expects to be discharged to:: Private residence Living Arrangements: Spouse/significant other Available Help at Discharge: Family Type of Home: House Home Access: Stairs to enter Entrance Stairs-Rails: Psychiatric nurse of Steps: 2 Home Layout: One level Home Equipment: Environmental consultant - 2 wheels;Cane - single point;Bedside commode;Adaptive equipment      Prior Function Level of Independence: Independent with assistive device(s)         Comments: uses cane PRN     Hand Dominance        Extremity/Trunk Assessment   Upper Extremity Assessment Upper Extremity Assessment: Overall WFL for tasks assessed    Lower Extremity Assessment Lower Extremity Assessment: Generalized weakness    Cervical / Trunk Assessment Cervical / Trunk Assessment: Normal  Communication   Communication: No difficulties  Cognition Arousal/Alertness: Awake/alert Behavior During Therapy: WFL for tasks assessed/performed Overall Cognitive Status: Within Functional Limits for tasks assessed  General Comments      Exercises     Assessment/Plan    PT Assessment Patient needs continued PT services  PT Problem List Decreased mobility;Decreased strength;Decreased activity tolerance;Decreased balance;Pain       PT Treatment Interventions DME instruction;Gait training;Therapeutic exercise;Balance training;Functional mobility training;Therapeutic activities;Patient/family education    PT Goals (Current goals  can be found in the Care Plan section)  Acute Rehab PT Goals Patient Stated Goal: get better and get home PT Goal Formulation: With patient/family Time For Goal Achievement: 01/07/21 Potential to Achieve Goals: Good    Frequency Min 3X/week   Barriers to discharge        Co-evaluation               AM-PAC PT "6 Clicks" Mobility  Outcome Measure Help needed turning from your back to your side while in a flat bed without using bedrails?: A Little Help needed moving from lying on your back to sitting on the side of a flat bed without using bedrails?: A Little Help needed moving to and from a bed to a chair (including a wheelchair)?: A Little Help needed standing up from a chair using your arms (e.g., wheelchair or bedside chair)?: A Little Help needed to walk in hospital room?: A Little Help needed climbing 3-5 steps with a railing? : A Little 6 Click Score: 18    End of Session Equipment Utilized During Treatment: Gait belt Activity Tolerance: Patient tolerated treatment well Patient left: in bed;with call bell/phone within reach;with bed alarm set;with family/visitor present   PT Visit Diagnosis: Unsteadiness on feet (R26.81);History of falling (Z91.81);Pain Pain - part of body:  (neck, back)    Time: 4975-3005 PT Time Calculation (min) (ACUTE ONLY): 39 min   Charges:   PT Evaluation $PT Eval Moderate Complexity: 1 Mod PT Treatments $Gait Training: 8-22 mins $Therapeutic Exercise: 8-22 mins          Doreatha Massed, PT Acute Rehabilitation  Office: [phone] Pager: [phone]

## 2020-12-24 NOTE — Progress Notes (Signed)
PROGRESS NOTE    Rebecca Short  KVQ:[id number] DOB: 1941/04/17 DOA: 12/23/2020 PCP: Daisy Floro, MD    Brief Narrative:  Rebecca Short was admitted to the hospital with the working diagnosis of neck and back pain complicated with atrial fibrillation with rapid ventricular response.   80 year old female past medical history for permanent atrial fibrillation, macular degeneration, ultra sleep apnea and obesity who presents with severe neck and back pain.  Reported severe and worsening neck and back pain, worse with movement, no paresthesias.  On her initial physical examination blood pressure 107/57, heart rate 160, respiratory rate 26, oxygen saturation 26%, her lungs are clear to auscultation bilaterally, heart S1-S2, irregularly irregular, tachycardic, abdomen soft and nontender, no lower extremity edema  Sodium 133, potassium 3.9, chloride 98, bicarb 22, glucose 227, BUN 19, creatinine 0.66, high sensitive troponin less than 2-2, white count 10.5, hemoglobin 15.2, hematocrit 46.2, platelets 193. SARS COVID-19 negative.  Chest radiograph with small left lower lobe opacity/effusion  CT chest with left lower lobe atelectasis, no pulmonary embolism or dissection.  EKG 124 bpm, normal axis, normal QTC, atrial fibrillation rhythm, poor R wave progression, low voltage, no significant ST segment or T wave changes.  Assessment & Plan:   Principal Problem:   Atrial fibrillation with RVR (HCC) Active Problems:   OSA (obstructive sleep apnea)   Musculoskeletal pain   Atrial fibrillation with rapid response/ hypotension. Patient now off diltiazem drip, tolerating well po metoprolol. Telemetry personally reviewed noted persistent atrial fibrillation with HR less than 100.   Continue metoprolol at current dose and anticoagulation with apixabana. Pain control is helping in heart rate control.  2. Musculoskeletal back pain. No dissection or pulmonary embolism. Continue pain control with as  needed oxycodone and for severe pain IV morphine. Scheduled acetaminophen and low dose ibuprofen TID flexeril and qid topical diclofenac.  Out of bed to chair tid with meals, and PT and OT.   3. OSA. Cpap at night  4. Reactive hyperglycemia. Continue close follow up on glucose. Hgb a1c 5,4  5. Hyponatremia. Improved Na up to 134, renal function stable with serum cr at 0,6   Patient continue to be at high risk for recurrent RVR   Status is: Observation  The patient remains OBS appropriate and will d/c before 2 midnights.  Dispo: The patient is from: Home              Anticipated d/c is to: Home              Patient currently is not medically stable to d/c.   Difficult to place patient No   DVT prophylaxis: Apixaban   Code Status:    full  Family Communication:  I spoke with patient's husband at the bedside, we talked in detail about patient's condition, plan of care and prognosis and all questions were addressed.   Subjective: Patient is feeling better but not yet back to baseline, pain improved, no nausea or vomiting, no dyspnea or chest pain  Objective: Vitals:   12/24/20 0545 12/24/20 0715 12/24/20 0800 12/24/20 0911  BP: 105/65 115/63 110/60 123/68  Pulse: 86 91 95 (!) 105  Resp: 20 (!) 22 (!) 26 16  Temp:   98.5 F (36.9 C) 98.7 F (37.1 C)  TempSrc:   Oral Oral  SpO2: 96% 100% 96% 97%  Weight:    81.4 kg  Height:    5\' 2"  (1.575 m)    Intake/Output Summary (Last 24 hours) at 12/24/2020 1407  Last data filed at 12/24/2020 1000 Gross per 24 hour  Intake 525.49 ml  Output --  Net 525.49 ml   Filed Weights   12/24/20 0911  Weight: 81.4 kg    Examination:   General: Not in pain or dyspnea, deconditioned  Neurology: Awake and alert, non focal  E ENT: no pallor, no icterus, oral mucosa moist Cardiovascular: No JVD. S1-S2 present, irregularly irregular no gallops, rubs, or murmurs. No lower extremity edema. Pulmonary: positive breath sounds bilaterally, a no  wheezing, rhonchi or rales. Gastrointestinal. Abdomen soft and non tender Skin. No rashes Musculoskeletal: no joint deformities     Data Reviewed: I have personally reviewed following labs and imaging studies  CBC: Recent Labs  Lab 12/23/20 1334  WBC 10.5  NEUTROABS 9.2*  HGB 15.2*  HCT 46.2*  MCV 91.3  PLT 193   Basic Metabolic Panel: Recent Labs  Lab 12/23/20 1334 12/24/20 0705  NA 133* 134*  K 3.9 3.7  CL 98 105  CO2 22 26  GLUCOSE 227* 114*  BUN 19 19  CREATININE 0.66 0.60  CALCIUM 9.0 8.1*   GFR: Estimated Creatinine Clearance: 55.4 mL/min (by C-G formula based on SCr of 0.6 mg/dL). Liver Function Tests: Recent Labs  Lab 12/23/20 1334  AST 20  ALT 20  ALKPHOS 76  BILITOT 0.9  PROT 6.3*  ALBUMIN 3.7   No results for input(s): LIPASE, AMYLASE in the last 168 hours. No results for input(s): AMMONIA in the last 168 hours. Coagulation Profile: No results for input(s): INR, PROTIME in the last 168 hours. Cardiac Enzymes: No results for input(s): CKTOTAL, CKMB, CKMBINDEX, TROPONINI in the last 168 hours. BNP (last 3 results) No results for input(s): PROBNP in the last 8760 hours. HbA1C: Recent Labs    12/23/20 2212  HGBA1C 5.4   CBG: Recent Labs  Lab 12/23/20 2207 12/24/20 0757 12/24/20 1332  GLUCAP 175* 103* 124*   Lipid Profile: No results for input(s): CHOL, HDL, LDLCALC, TRIG, CHOLHDL, LDLDIRECT in the last 72 hours. Thyroid Function Tests: Recent Labs    12/24/20 0705  TSH 0.698   Anemia Panel: No results for input(s): VITAMINB12, FOLATE, FERRITIN, TIBC, IRON, RETICCTPCT in the last 72 hours.    Radiology Studies: I have reviewed all of the imaging during this hospital visit personally     Scheduled Meds:  apixaban  5 mg Oral BID   calcium citrate  200 mg of elemental calcium Oral QHS   cholecalciferol  400 Units Oral QHS   cyclobenzaprine  5 mg Oral QHS   insulin aspart  0-9 Units Subcutaneous TID WC   metoprolol  tartrate  25 mg Oral BID   polyvinyl alcohol  1 drop Both Eyes BID   Continuous Infusions:   LOS: 0 days        Mariany Mackintosh Annett Gula, MD

## 2020-12-25 DIAGNOSIS — M7918 Myalgia, other site: Secondary | ICD-10-CM | POA: Diagnosis not present

## 2020-12-25 DIAGNOSIS — I4891 Unspecified atrial fibrillation: Secondary | ICD-10-CM | POA: Diagnosis not present

## 2020-12-25 DIAGNOSIS — G4733 Obstructive sleep apnea (adult) (pediatric): Secondary | ICD-10-CM | POA: Diagnosis not present

## 2020-12-25 LAB — GLUCOSE, CAPILLARY
Glucose-Capillary: 84 mg/dL (ref 70–99)
Glucose-Capillary: 89 mg/dL (ref 70–99)

## 2020-12-25 MED ORDER — CYCLOBENZAPRINE HCL 5 MG PO TABS: 5.0000 mg | ORAL_TABLET | Freq: Three times a day (TID) | ORAL | 0 refills | Status: DC | PRN

## 2020-12-25 MED ORDER — DICLOFENAC SODIUM 1 % EX GEL: 2.0000 g | Freq: Three times a day (TID) | CUTANEOUS | 0 refills | Status: AC

## 2020-12-25 MED ORDER — ACETAMINOPHEN 325 MG PO TABS: 650.0000 mg | ORAL_TABLET | Freq: Four times a day (QID) | ORAL | Status: DC | PRN

## 2020-12-25 NOTE — Evaluation (Signed)
Occupational Therapy Evaluation Patient Details Name: Rebecca Short MRN: [id number] DOB: Jan 20, 1941 Today's Date: 12/25/2020    History of Present Illness patient is a 80 yo female admitted with Afib, neck/back pain. Hx of Afib, vertigo, mac deg, obesity   Clinical Impression   Patient evaluated by Occupational Therapy with no further acute OT needs identified. All education has been completed and the patient has no further questions. Patient completed washing up at sink level with cane, toileting with cane and bed mobility with MI on this date. Patient reported having all needed equipment at home. Patient reported having no pain on this date. Patients husband was present for portion of session as well.  See below for any follow-up Occupational Therapy or equipment needs. OT is signing off.      Follow Up Recommendations  No OT follow up    Equipment Recommendations  None recommended by OT    Recommendations for Other Services       Precautions / Restrictions Precautions Precautions: Fall Restrictions Weight Bearing Restrictions: No      Mobility Bed Mobility Overal bed mobility: Modified Independent                  Transfers Overall transfer level: Modified independent                    Balance Overall balance assessment: Modified Independent                                         ADL either performed or assessed with clinical judgement   ADL Overall ADL's : Modified independent                                       General ADL Comments: patient was able to complete all ADl tasks with cane with MI on this date in room. toileting, and washing up standing at sink with no reports of pain at this time.     Vision Baseline Vision/History: Wears glasses Wears Glasses: At all times       Perception     Praxis      Pertinent Vitals/Pain Pain Assessment: No/denies pain     Hand Dominance Right    Extremity/Trunk Assessment Upper Extremity Assessment Upper Extremity Assessment: Overall WFL for tasks assessed   Lower Extremity Assessment Lower Extremity Assessment: Defer to PT evaluation   Cervical / Trunk Assessment Cervical / Trunk Assessment: Normal   Communication Communication Communication: No difficulties   Cognition Arousal/Alertness: Awake/alert Behavior During Therapy: WFL for tasks assessed/performed Overall Cognitive Status: Within Functional Limits for tasks assessed                                     General Comments       Exercises     Shoulder Instructions      Home Living Family/patient expects to be discharged to:: Private residence Living Arrangements: Spouse/significant other Available Help at Discharge: Family Type of Home: House Home Access: Stairs to enter Technical brewer of Steps: 2 Entrance Stairs-Rails: Right;Left Home Layout: One level     Bathroom Shower/Tub: Walk-in shower         Home Equipment: Environmental consultant - 2 wheels;Cane - single  point;Bedside commode;Adaptive equipment Adaptive Equipment: Reacher;Long-handled shoe horn;Long-handled sponge;Sock aid        Prior Functioning/Environment Level of Independence: Independent with assistive device(s)        Comments: uses cane PRN        OT Problem List:        OT Treatment/Interventions:      OT Goals(Current goals can be found in the care plan section)    OT Frequency:     Barriers to D/C:            Co-evaluation              AM-PAC OT "6 Clicks" Daily Activity     Outcome Measure Help from another person eating meals?: None Help from another person taking care of personal grooming?: None Help from another person toileting, which includes using toliet, bedpan, or urinal?: None Help from another person bathing (including washing, rinsing, drying)?: None Help from another person to put on and taking off regular upper body clothing?:  None Help from another person to put on and taking off regular lower body clothing?: None 6 Click Score: 24   End of Session Equipment Utilized During Treatment: Gait belt;Other (comment) (cane) Nurse Communication: Other (comment) (nurse cleared patient for session)  Activity Tolerance: Patient tolerated treatment well Patient left: in chair;with call bell/phone within reach;with family/visitor present  OT Visit Diagnosis: Unsteadiness on feet (R26.81);Muscle weakness (generalized) (M62.81)                Time: 6378-5885 OT Time Calculation (min): 28 min Charges:  OT General Charges $OT Visit: 1 Visit OT Evaluation $OT Eval Low Complexity: 1 Low OT Treatments $Self Care/Home Management : 8-22 mins  Jackelyn Poling OTR/L, MS Acute Rehabilitation Department Office# [phone] Pager# [phone]   Rebecca Short 12/25/2020, 10:28 AM

## 2020-12-25 NOTE — Progress Notes (Signed)
Written script for outpatient PT given to patient with discharge instructions. Melton Alar, RN

## 2020-12-25 NOTE — Discharge Summary (Signed)
Physician Discharge Summary  Rebecca Short SFK:[id number] DOB: 1940-09-26 DOA: 12/23/2020  PCP: Daisy Floro, MD  Admit date: 12/23/2020 Discharge date: 12/25/2020  Admitted From: Home  Disposition:   Home   Recommendations for Outpatient Follow-up and new medication changes:  Follow up with  Dr Tenny Craw in 7 to 10 days.  Continue topical diclofenac to left base neck tid for 3 days, then continue as needed. Added as needed cyclobenzaprine for muscle spasms.   Home Health: no   Equipment/Devices: no    Discharge Condition: stable  CODE STATUS: full  Diet recommendation:  heart healthy   Brief/Interim Summary: Rebecca Short was admitted to the hospital with the working diagnosis of neck and back pain complicated with atrial fibrillation with rapid ventricular response.    80 year old female past medical history for permanent atrial fibrillation, macular degeneration, obstructive sleep apnea and obesity who presents with severe neck and back pain.  Reported severe and worsening neck and back pain, worse with movement, no paresthesias.  On her initial physical examination blood pressure 107/57, heart rate 160, respiratory rate 26, oxygen saturation 26%, her lungs were clear to auscultation bilaterally, heart S1-S2, irregularly irregular, tachycardic, abdomen soft and nontender, no lower extremity edema   Sodium 133, potassium 3.9, chloride 98, bicarb 22, glucose 227, BUN 19, creatinine 0.66, high sensitive troponin less than 2-2, white count 10.5, hemoglobin 15.2, hematocrit 46.2, platelets 193. SARS COVID-19 negative.   Chest radiograph with small left lower lobe opacity/effusion   CT chest with left lower lobe atelectasis, no pulmonary embolism or dissection.   EKG 124 bpm, normal axis, normal QTC, atrial fibrillation rhythm, poor R wave progression, low voltage, no significant ST segment or T wave changes.  Patient was placed on IV diltiazem infusion for heart rate control, then  transitioned to oral metoprolol with good toleration. Pain control with analgesics and anti-inflammatory regimen.  Plan to resume outpatient OT.   Atrial fibrillation with rapid ventricular response/ hypotension.  Patient was admitted to the telemetry ward. Her heart rate was controlled with diltiazem infusion. Posteriorly changed to oral metoprolol. Continue anticoagulation with apixaban.  Her heart rate has been less than 105 over the last 24 hours.  2.  Musculoskeletal back pain.  Patient was ruled out for acute coronary syndrome, pulmonary embolism or aortic dissection.  She was placed on a analgesic, muscle relaxant and anti-inflammatory regiment with ibuprofen, diclofenac, cyclobenzaprine, acetaminophen and oxycodone.  At discharge her pain has significantly improved. Continue topical diclofenac and as needed cyclobenzaprine  3.  Obstructive sleep apnea.  Continue CPAP.  4.  Reactive hyperglycemia.  Her glucose improved, hemoglobin A1c 5.4  5.  Hyponatremia.  Sodium improved, discharged 134, kidney function is stable with a serum creatinine of 0.60.   Discharge Diagnoses:  Principal Problem:   Atrial fibrillation with RVR (HCC) Active Problems:   OSA (obstructive sleep apnea)   Musculoskeletal pain   Atrial fibrillation [facility])    Discharge Instructions   Allergies as of 12/25/2020       Reactions   Condrolite [glucosamine-chondroitin-msm] Rash        Medication List     STOP taking these medications    metroNIDAZOLE 0.75 % gel Commonly known as: METROGEL   oxyCODONE 5 MG immediate release tablet Commonly known as: Oxy IR/ROXICODONE       TAKE these medications    acetaminophen 325 MG tablet Commonly known as: TYLENOL Take 2 tablets (650 mg total) by mouth every 6 (six) hours as needed  for moderate pain or mild pain.   CALCIUM CITRATE-VITAMIN D PO Take 1 tablet by mouth at bedtime.   carboxymethylcellulose 0.5 % Soln Commonly known as: REFRESH  PLUS Place 1 drop into both eyes 2 (two) times daily.   cyclobenzaprine 5 MG tablet Commonly known as: FLEXERIL Take 1 tablet (5 mg total) by mouth 3 (three) times daily as needed for muscle spasms (neck spasms).   diclofenac Sodium 1 % Gel Commonly known as: VOLTAREN Apply 2 g topically 3 (three) times daily for 3 days.   Eliquis 5 MG Tabs tablet Generic drug: apixaban TAKE 1 TABLET BY MOUTH TWICE A DAY   EpiPen 2-Pak 0.3 mg/0.3 mL Soaj injection Generic drug: EPINEPHrine Inject 0.3 mg into the muscle once as needed for anaphylaxis (severe allergic reaction).   metoprolol tartrate 25 MG tablet Commonly known as: LOPRESSOR TAKE 1 TABLET BY MOUTH TWICE A DAY   multivitamin with minerals Tabs tablet Take 1 tablet by mouth every morning.   PRESCRIPTION MEDICATION every 14 (fourteen) days. Allergy shots administered by Dr. [place] Callas   PRESERVISION AREDS PO Take 1 capsule by mouth 2 (two) times daily.        Allergies  Allergen Reactions   Condrolite [Glucosamine-Chondroitin-Msm] Rash       Procedures/Studies: CT L-SPINE NO CHARGE  Result Date: 12/23/2020 CLINICAL DATA:  Low back and abdominal pain for approximately 2 days. No known injury. EXAM: CT Lumbar Spine with contrast TECHNIQUE: Technique: Multiplanar CT images of the lumbar spine were reconstructed from contemporary CT of the Abdomen and Pelvis. CONTRAST:  No additional. COMPARISON:  Plain films lumbar spine 01/31/2015. FINDINGS: Segmentation: The patient has transitional lumbosacral anatomy with sacralization of the lowest lumbar segment on the right. Alignment: No listhesis. Convex left scoliosis with the apex at L2-3. Vertebrae: There is a Schmorl's node versus central inferior endplate compression fracture L4. No other finding to suggest fracture. No worrisome bony lesion. Paraspinal and other soft tissues: See report of CT angiogram of the chest, abdomen and pelvis today. Disc levels: Multilevel degenerative disc  disease is identified. Degenerative change appears worst at L3-4 and L4-5 where there is ligamentum flavum thickening and disc bulging. Facet arthropathy is also present at these levels. Moderately severe to severe central canal stenosis at both levels is worse at L4-5. IMPRESSION: Defect in the inferior endplate of L4 cannot be definitively characterized but appears chronic and is favored to represent a Schmorl's node rather than a compression fracture. Lumbar spondylosis appears worst at L3-4 and L4-5 where there is moderately severe to severe central canal stenosis, worse at L4-5. Electronically Signed   By: Drusilla Kanner M.D.   On: 12/23/2020 16:26   DG Chest Portable 1 View  Result Date: 12/23/2020 CLINICAL DATA:  Shortness of breath. Back pain since exercising 2 days ago. Now central chest pain. History of atrial fibrillation. EXAM: PORTABLE CHEST 1 VIEW COMPARISON:  Oct 04, 2013 FINDINGS: Cardiomegaly. The hila and mediastinum are unchanged. No pneumothorax. The right lung is clear. Opacity in left base is chronic in appearance, consistent with scarring or chronic atelectasis. No acute infiltrate identified. IMPRESSION: Chronic changes in the left base.  No acute abnormalities. Electronically Signed   By: Gerome Sam III M.D   On: 12/23/2020 13:44   ECHOCARDIOGRAM COMPLETE  Result Date: 12/24/2020    ECHOCARDIOGRAM REPORT   Patient Name:   TAKAYLA BAILLIE Date of Exam: 12/24/2020 Medical Rec #:  [id number]     Height:  62.0 in Accession #:    [id number]    Weight:       180.0 lb Date of Birth:  16-Jul-1940     BSA:          1.828 m Patient Age:    79 years      BP:           115/63 mmHg Patient Gender: F             HR:           91 bpm. Exam Location:  Inpatient Procedure: 2D Echo, Color Doppler, Cardiac Doppler and Intracardiac            Opacification Agent Indications:     Atrial Fibrillation I48.91  History:         Patient has prior history of Echocardiogram examinations, most                   recent 07/15/2013. Arrythmias:Atrial Fibrillation.  Sonographer:     Leta Jungling RDCS Referring Phys:  [id number] Sunny Schlein [facility] Diagnosing Phys: Epifanio Lesches MD  Sonographer Comments: Suboptimal apical window and suboptimal subcostal window. IMPRESSIONS  1. Technically difficult study. Left ventricular ejection fraction, by estimation, is 50 to 55%. The left ventricle has normal function. The left ventricle has no regional wall motion abnormalities. There is mild left ventricular hypertrophy. Left ventricular diastolic parameters are indeterminate.  2. Right ventricular systolic function is normal. The right ventricular size is mildly enlarged. Tricuspid regurgitation signal is inadequate for assessing PA pressure.  3. Moderate pericardial effusion measuring up to 1 cm adjacent to LV posterior wall. No evidence of tamponade  4. The mitral valve is normal in structure. No evidence of mitral valve regurgitation.  5. The aortic valve was not well visualized. Aortic valve regurgitation is not visualized. FINDINGS  Left Ventricle: Left ventricular ejection fraction, by estimation, is 50 to 55%. The left ventricle has low normal function. The left ventricle has no regional wall motion abnormalities. Definity contrast agent was given IV to delineate the left ventricular endocardial borders. The left ventricular internal cavity size was normal in size. There is mild left ventricular hypertrophy. Left ventricular diastolic parameters are indeterminate. Right Ventricle: The right ventricular size is mildly enlarged. Right vetricular wall thickness was not well visualized. Right ventricular systolic function is normal. Tricuspid regurgitation signal is inadequate for assessing PA pressure. Left Atrium: Left atrial size was not well visualized. Right Atrium: Right atrial size was not well visualized. Pericardium: A moderately sized pericardial effusion is present. Mitral Valve: The mitral valve is normal in structure. No  evidence of mitral valve regurgitation. Tricuspid Valve: The tricuspid valve is normal in structure. Tricuspid valve regurgitation is trivial. Aortic Valve: The aortic valve was not well visualized. Aortic valve regurgitation is not visualized. Pulmonic Valve: The pulmonic valve was not well visualized. Pulmonic valve regurgitation is not visualized. Aorta: The aortic root and ascending aorta are structurally normal, with no evidence of dilitation. IAS/Shunts: The interatrial septum was not well visualized.  LEFT VENTRICLE PLAX 2D LVIDd:         4.30 cm LVIDs:         3.80 cm LV PW:         0.80 cm LV IVS:        1.20 cm LVOT diam:     1.60 cm LVOT Area:     2.01 cm  LEFT ATRIUM  Index LA diam:      4.60 cm 2.52 cm/m LA Vol (A4C): 97.8 ml 53.50 ml/m   AORTA Ao Root diam: 2.90 cm Ao Asc diam:  3.20 cm  SHUNTS Systemic Diam: 1.60 cm Epifanio Lesches MD Electronically signed by Epifanio Lesches MD Signature Date/Time: 12/24/2020/8:51:33 AM    Final (Updated)    CT Angio Chest/Abd/Pel for Dissection W and/or Wo Contrast  Result Date: 12/23/2020 CLINICAL DATA:  Abdominal pain, aortic dissection suspected EXAM: CT ANGIOGRAPHY CHEST, ABDOMEN AND PELVIS TECHNIQUE: Non-contrast CT of the chest was initially obtained. Multidetector CT imaging through the chest, abdomen and pelvis was performed using the standard protocol during bolus administration of intravenous contrast. Multiplanar reconstructed images and MIPs were obtained and reviewed to evaluate the vascular anatomy. CONTRAST:  21mL OMNIPAQUE IOHEXOL 350 MG/ML SOLN COMPARISON:  01/10/2010 FINDINGS: CTA CHEST FINDINGS Cardiovascular: Cardiomegaly. Aorta normal caliber. No dissection. No filling defects in the pulmonary arteries to suggest pulmonary emboli. Mediastinum/Nodes: No mediastinal, hilar, or axillary adenopathy. Trachea and esophagus are unremarkable. Thyroid unremarkable. Lungs/Pleura: Left lower lobe atelectasis or infiltrate/pneumonia.  Minimal right base atelectasis. No effusions. Musculoskeletal: Chest wall soft tissues are unremarkable. No acute bony abnormality. Review of the MIP images confirms the above findings. CTA ABDOMEN AND PELVIS FINDINGS VASCULAR Aorta: Scattered infrarenal aortic calcifications. No aneurysm or dissection. Celiac: Patent SMA: Patent Renals: Patent IMA: Patent Inflow: Scattered calcifications.  No aneurysm or dissection. Veins: Choose 1 Review of the MIP images confirms the above findings. NON-VASCULAR Hepatobiliary: Prior cholecystectomy. Prominent common bile duct likely related to post cholecystectomy state and patient's age, measuring up to 18 mm. No intrahepatic biliary ductal dilatation or focal hepatic abnormality. Pancreas: No focal abnormality or ductal dilatation. Spleen: No focal abnormality.  Normal size. Adrenals/Urinary Tract: Small scattered renal cysts. No renal or ureteral stones. No hydronephrosis. Adrenal glands and urinary bladder unremarkable. Stomach/Bowel: Diffuse colonic diverticulosis. Stomach and small bowel decompressed. Lymphatic: No adenopathy Reproductive: Calcified fibroids within the pelvis. No adnexal mass. Other: No free fluid or free air. Musculoskeletal: No acute bony abnormality. Degenerative changes throughout the lumbar spine. Review of the MIP images confirms the above findings. IMPRESSION: No aortic aneurysm or dissection. No evidence of pulmonary embolus. Cardiomegaly. Right base atelectasis.  Left lower lobe atelectasis or infiltrate. Diffuse colonic diverticulosis.  No active diverticulitis. Uterine fibroids. Electronically Signed   By: Charlett Nose M.D.   On: 12/23/2020 16:38       Subjective: Patient is feeling better, neck pain has improved, no dyspnea or chest pain, no palpitations.   Discharge Exam: Vitals:   12/24/20 2201 12/25/20 0610  BP:  119/78  Pulse: 97 73  Resp: 18 18  Temp:  (!) 97.4 F (36.3 C)  SpO2: 97% 98%   Vitals:   12/24/20 1420 12/24/20  1947 12/24/20 2201 12/25/20 0610  BP: 129/63 136/73  119/78  Pulse: (!) 106 (!) 103 97 73  Resp: 16 18 18 18   Temp: 98.6 F (37 C) 98.3 F (36.8 C)  (!) 97.4 F (36.3 C)  TempSrc: Oral Oral  Oral  SpO2: 95% 93% 97% 98%  Weight:      Height:        General: Not in pain or dyspnea  Neurology: Awake and alert, non focal  E ENT: no pallor, no icterus, oral mucosa moist Cardiovascular: No JVD. S1-S2 present, irregularly irregular, no gallops, rubs, or murmurs. No lower extremity edema. Pulmonary:  positive breath sounds bilaterally, adequate air movement, no wheezing, rhonchi or rales.  Gastrointestinal. Abdomen soft and non tender Skin. No rashes Musculoskeletal: no joint deformities   The results of significant diagnostics from this hospitalization (including imaging, microbiology, ancillary and laboratory) are listed below for reference.     Microbiology: Recent Results (from the past 240 hour(s))  Resp Panel by RT-PCR (Flu A&B, Covid) Nasopharyngeal Swab     Status: None   Collection Time: 12/23/20 10:01 PM   Specimen: Nasopharyngeal Swab; Nasopharyngeal(NP) swabs in vial transport medium  Result Value Ref Range Status   SARS Coronavirus 2 by RT PCR NEGATIVE NEGATIVE Final    Comment: (NOTE) SARS-CoV-2 target nucleic acids are NOT DETECTED.  The SARS-CoV-2 RNA is generally detectable in upper respiratory specimens during the acute phase of infection. The lowest concentration of SARS-CoV-2 viral copies this assay can detect is 138 copies/mL. A negative result does not preclude SARS-Cov-2 infection and should not be used as the sole basis for treatment or other patient management decisions. A negative result may occur with  improper specimen collection/handling, submission of specimen other than nasopharyngeal swab, presence of viral mutation(s) within the areas targeted by this assay, and inadequate number of viral copies(<138 copies/mL). A negative result must be combined  with clinical observations, patient history, and epidemiological information. The expected result is Negative.  Fact Sheet for Patients:  BloggerCourse.com  Fact Sheet for Healthcare Providers:  SeriousBroker.it  This test is no t yet approved or cleared by the [place] FDA and  has been authorized for detection and/or diagnosis of SARS-CoV-2 by FDA under an Emergency Use Authorization (EUA). This EUA will remain  in effect (meaning this test can be used) for the duration of the COVID-19 declaration under Section 564(b)(1) of the Act, 21 U.S.C.section 360bbb-3(b)(1), unless the authorization is terminated  or revoked sooner.       Influenza A by PCR NEGATIVE NEGATIVE Final   Influenza B by PCR NEGATIVE NEGATIVE Final    Comment: (NOTE) The Xpert Xpress SARS-CoV-2/FLU/RSV plus assay is intended as an aid in the diagnosis of influenza from Nasopharyngeal swab specimens and should not be used as a sole basis for treatment. Nasal washings and aspirates are unacceptable for Xpert Xpress SARS-CoV-2/FLU/RSV testing.  Fact Sheet for Patients: BloggerCourse.com  Fact Sheet for Healthcare Providers: SeriousBroker.it  This test is not yet approved or cleared by the [place] FDA and has been authorized for detection and/or diagnosis of SARS-CoV-2 by FDA under an Emergency Use Authorization (EUA). This EUA will remain in effect (meaning this test can be used) for the duration of the COVID-19 declaration under Section 564(b)(1) of the Act, 21 U.S.C. section 360bbb-3(b)(1), unless the authorization is terminated or revoked.  Performed at [facility], [address], [place], [place] [address]      Labs: BNP (last 3 results) Recent Labs    12/23/20 1335  BNP 190.9*   Basic Metabolic Panel: Recent Labs  Lab 12/23/20 1334 12/24/20 0705  NA 133* 134*   K 3.9 3.7  CL 98 105  CO2 22 26  GLUCOSE 227* 114*  BUN 19 19  CREATININE 0.66 0.60  CALCIUM 9.0 8.1*   Liver Function Tests: Recent Labs  Lab 12/23/20 1334  AST 20  ALT 20  ALKPHOS 76  BILITOT 0.9  PROT 6.3*  ALBUMIN 3.7   No results for input(s): LIPASE, AMYLASE in the last 168 hours. No results for input(s): AMMONIA in the last 168 hours. CBC: Recent Labs  Lab 12/23/20 1334  WBC 10.5  NEUTROABS  9.2*  HGB 15.2*  HCT 46.2*  MCV 91.3  PLT 193   Cardiac Enzymes: No results for input(s): CKTOTAL, CKMB, CKMBINDEX, TROPONINI in the last 168 hours. BNP: Invalid input(s): POCBNP CBG: Recent Labs  Lab 12/24/20 1332 12/24/20 1622 12/24/20 2110 12/25/20 0739 12/25/20 1117  GLUCAP 124* 103* 137* 84 89   D-Dimer No results for input(s): DDIMER in the last 72 hours. Hgb A1c Recent Labs    12/23/20 2212  HGBA1C 5.4   Lipid Profile No results for input(s): CHOL, HDL, LDLCALC, TRIG, CHOLHDL, LDLDIRECT in the last 72 hours. Thyroid function studies Recent Labs    12/24/20 0705  TSH 0.698   Anemia work up No results for input(s): VITAMINB12, FOLATE, FERRITIN, TIBC, IRON, RETICCTPCT in the last 72 hours. Urinalysis    Component Value Date/Time   COLORURINE YELLOW 07/15/2013 1004   APPEARANCEUR CLEAR 07/15/2013 1004   LABSPEC 1.015 07/15/2013 1004   PHURINE 7.5 07/15/2013 1004   GLUCOSEU NEGATIVE 07/15/2013 1004   HGBUR NEGATIVE 07/15/2013 1004   BILIRUBINUR NEGATIVE 07/15/2013 1004   KETONESUR NEGATIVE 07/15/2013 1004   PROTEINUR NEGATIVE 07/15/2013 1004   UROBILINOGEN 0.2 07/15/2013 1004   NITRITE NEGATIVE 07/15/2013 1004   LEUKOCYTESUR NEGATIVE 07/15/2013 1004   Sepsis Labs Invalid input(s): PROCALCITONIN,  WBC,  LACTICIDVEN Microbiology Recent Results (from the past 240 hour(s))  Resp Panel by RT-PCR (Flu A&B, Covid) Nasopharyngeal Swab     Status: None   Collection Time: 12/23/20 10:01 PM   Specimen: Nasopharyngeal Swab; Nasopharyngeal(NP)  swabs in vial transport medium  Result Value Ref Range Status   SARS Coronavirus 2 by RT PCR NEGATIVE NEGATIVE Final    Comment: (NOTE) SARS-CoV-2 target nucleic acids are NOT DETECTED.  The SARS-CoV-2 RNA is generally detectable in upper respiratory specimens during the acute phase of infection. The lowest concentration of SARS-CoV-2 viral copies this assay can detect is 138 copies/mL. A negative result does not preclude SARS-Cov-2 infection and should not be used as the sole basis for treatment or other patient management decisions. A negative result may occur with  improper specimen collection/handling, submission of specimen other than nasopharyngeal swab, presence of viral mutation(s) within the areas targeted by this assay, and inadequate number of viral copies(<138 copies/mL). A negative result must be combined with clinical observations, patient history, and epidemiological information. The expected result is Negative.  Fact Sheet for Patients:  BloggerCourse.com  Fact Sheet for Healthcare Providers:  SeriousBroker.it  This test is no t yet approved or cleared by the [place] FDA and  has been authorized for detection and/or diagnosis of SARS-CoV-2 by FDA under an Emergency Use Authorization (EUA). This EUA will remain  in effect (meaning this test can be used) for the duration of the COVID-19 declaration under Section 564(b)(1) of the Act, 21 U.S.C.section 360bbb-3(b)(1), unless the authorization is terminated  or revoked sooner.       Influenza A by PCR NEGATIVE NEGATIVE Final   Influenza B by PCR NEGATIVE NEGATIVE Final    Comment: (NOTE) The Xpert Xpress SARS-CoV-2/FLU/RSV plus assay is intended as an aid in the diagnosis of influenza from Nasopharyngeal swab specimens and should not be used as a sole basis for treatment. Nasal washings and aspirates are unacceptable for Xpert Xpress  SARS-CoV-2/FLU/RSV testing.  Fact Sheet for Patients: BloggerCourse.com  Fact Sheet for Healthcare Providers: SeriousBroker.it  This test is not yet approved or cleared by the [place] FDA and has been authorized for detection and/or diagnosis of SARS-CoV-2 by FDA  under an Emergency Use Authorization (EUA). This EUA will remain in effect (meaning this test can be used) for the duration of the COVID-19 declaration under Section 564(b)(1) of the Act, 21 U.S.C. section 360bbb-3(b)(1), unless the authorization is terminated or revoked.  Performed at [facility], [address], [place], [place] [address]      Time coordinating discharge: 45 minutes  SIGNED:   Coralie Keens, MD  [facility] 12/25/2020, 12:26 PM

## 2021-01-03 ENCOUNTER — Inpatient Hospital Stay (HOSPITAL_COMMUNITY)
Admission: EM | Admit: 2021-01-03 | Discharge: 2021-01-11 | DRG: 314 | Disposition: A | Discharge status: Home Health | Payer: Medicare Other | Attending: Internal Medicine | Admitting: Internal Medicine

## 2021-01-03 ENCOUNTER — Encounter (HOSPITAL_COMMUNITY): Payer: Self-pay

## 2021-01-03 ENCOUNTER — Other Ambulatory Visit: Payer: Self-pay

## 2021-01-03 ENCOUNTER — Emergency Department (HOSPITAL_COMMUNITY): Payer: Medicare Other

## 2021-01-03 DIAGNOSIS — D72829 Elevated white blood cell count, unspecified: Secondary | ICD-10-CM | POA: Diagnosis not present

## 2021-01-03 DIAGNOSIS — Z888 Allergy status to other drugs, medicaments and biological substances status: Secondary | ICD-10-CM

## 2021-01-03 DIAGNOSIS — I959 Hypotension, unspecified: Secondary | ICD-10-CM

## 2021-01-03 DIAGNOSIS — G4733 Obstructive sleep apnea (adult) (pediatric): Secondary | ICD-10-CM | POA: Diagnosis present

## 2021-01-03 DIAGNOSIS — Q7649 Other congenital malformations of spine, not associated with scoliosis: Secondary | ICD-10-CM

## 2021-01-03 DIAGNOSIS — I5031 Acute diastolic (congestive) heart failure: Secondary | ICD-10-CM | POA: Diagnosis present

## 2021-01-03 DIAGNOSIS — H3530 Unspecified macular degeneration: Secondary | ICD-10-CM | POA: Diagnosis present

## 2021-01-03 DIAGNOSIS — E871 Hypo-osmolality and hyponatremia: Secondary | ICD-10-CM | POA: Diagnosis present

## 2021-01-03 DIAGNOSIS — Z9049 Acquired absence of other specified parts of digestive tract: Secondary | ICD-10-CM

## 2021-01-03 DIAGNOSIS — E669 Obesity, unspecified: Secondary | ICD-10-CM | POA: Diagnosis present

## 2021-01-03 DIAGNOSIS — Z7901 Long term (current) use of anticoagulants: Secondary | ICD-10-CM

## 2021-01-03 DIAGNOSIS — E876 Hypokalemia: Secondary | ICD-10-CM | POA: Diagnosis not present

## 2021-01-03 DIAGNOSIS — R0603 Acute respiratory distress: Secondary | ICD-10-CM | POA: Diagnosis not present

## 2021-01-03 DIAGNOSIS — Z9889 Other specified postprocedural states: Secondary | ICD-10-CM | POA: Diagnosis not present

## 2021-01-03 DIAGNOSIS — Z20822 Contact with and (suspected) exposure to covid-19: Secondary | ICD-10-CM | POA: Diagnosis present

## 2021-01-03 DIAGNOSIS — I4821 Permanent atrial fibrillation: Secondary | ICD-10-CM | POA: Diagnosis present

## 2021-01-03 DIAGNOSIS — J90 Pleural effusion, not elsewhere classified: Secondary | ICD-10-CM

## 2021-01-03 DIAGNOSIS — Z6834 Body mass index (BMI) 34.0-34.9, adult: Secondary | ICD-10-CM | POA: Diagnosis not present

## 2021-01-03 DIAGNOSIS — I9589 Other hypotension: Secondary | ICD-10-CM | POA: Diagnosis not present

## 2021-01-03 DIAGNOSIS — I4891 Unspecified atrial fibrillation: Secondary | ICD-10-CM | POA: Diagnosis not present

## 2021-01-03 DIAGNOSIS — I3139 Other pericardial effusion (noninflammatory): Secondary | ICD-10-CM

## 2021-01-03 DIAGNOSIS — N179 Acute kidney failure, unspecified: Secondary | ICD-10-CM | POA: Diagnosis not present

## 2021-01-03 DIAGNOSIS — Z79899 Other long term (current) drug therapy: Secondary | ICD-10-CM

## 2021-01-03 DIAGNOSIS — M549 Dorsalgia, unspecified: Secondary | ICD-10-CM | POA: Diagnosis present

## 2021-01-03 DIAGNOSIS — I313 Pericardial effusion (noninflammatory): Secondary | ICD-10-CM | POA: Diagnosis not present

## 2021-01-03 DIAGNOSIS — G8929 Other chronic pain: Secondary | ICD-10-CM | POA: Diagnosis present

## 2021-01-03 DIAGNOSIS — Z881 Allergy status to other antibiotic agents status: Secondary | ICD-10-CM | POA: Diagnosis not present

## 2021-01-03 DIAGNOSIS — J189 Pneumonia, unspecified organism: Secondary | ICD-10-CM | POA: Diagnosis present

## 2021-01-03 DIAGNOSIS — Z96653 Presence of artificial knee joint, bilateral: Secondary | ICD-10-CM | POA: Diagnosis present

## 2021-01-03 LAB — URINALYSIS, ROUTINE W REFLEX MICROSCOPIC
Bilirubin Urine: NEGATIVE
Glucose, UA: NEGATIVE mg/dL
Hgb urine dipstick: NEGATIVE
Ketones, ur: 20 mg/dL — AB
Nitrite: NEGATIVE
Protein, ur: NEGATIVE mg/dL
Specific Gravity, Urine: 1.013 (ref 1.005–1.030)
pH: 6 (ref 5.0–8.0)

## 2021-01-03 LAB — PROTIME-INR
INR: 1.5 — ABNORMAL HIGH (ref 0.8–1.2)
Prothrombin Time: 18.4 seconds — ABNORMAL HIGH (ref 11.4–15.2)

## 2021-01-03 LAB — BASIC METABOLIC PANEL
Anion gap: 9 (ref 5–15)
BUN: 12 mg/dL (ref 8–23)
CO2: 25 mmol/L (ref 22–32)
Calcium: 8.6 mg/dL — ABNORMAL LOW (ref 8.9–10.3)
Chloride: 96 mmol/L — ABNORMAL LOW (ref 98–111)
Creatinine, Ser: 0.85 mg/dL (ref 0.44–1.00)
GFR, Estimated: >60 mL/min (ref >60)
Glucose, Bld: 121 mg/dL — ABNORMAL HIGH (ref 70–99)
Potassium: 4.3 mmol/L (ref 3.5–5.1)
Sodium: 130 mmol/L — ABNORMAL LOW (ref 135–145)

## 2021-01-03 LAB — RESP PANEL BY RT-PCR (FLU A&B, COVID) ARPGX2
Influenza A by PCR: NEGATIVE
Influenza B by PCR: NEGATIVE
SARS Coronavirus 2 by RT PCR: NEGATIVE

## 2021-01-03 LAB — CBC WITH DIFFERENTIAL/PLATELET
Abs Immature Granulocytes: 0.08 10*3/uL — ABNORMAL HIGH (ref 0.00–0.07)
Basophils Absolute: 0 10*3/uL (ref 0.0–0.1)
Basophils Relative: 0 %
Eosinophils Absolute: 0 10*3/uL (ref 0.0–0.5)
Eosinophils Relative: 0 %
HCT: 42.9 % (ref 36.0–46.0)
Hemoglobin: 14.2 g/dL (ref 12.0–15.0)
Immature Granulocytes: 1 %
Lymphocytes Relative: 3 %
Lymphs Abs: 0.4 10*3/uL — ABNORMAL LOW (ref 0.7–4.0)
MCH: 29.7 pg (ref 26.0–34.0)
MCHC: 33.1 g/dL (ref 30.0–36.0)
MCV: 89.7 fL (ref 80.0–100.0)
Monocytes Absolute: 1 10*3/uL (ref 0.1–1.0)
Monocytes Relative: 8 %
Neutro Abs: 11.1 10*3/uL — ABNORMAL HIGH (ref 1.7–7.7)
Neutrophils Relative %: 88 %
Platelets: 293 10*3/uL (ref 150–400)
RBC: 4.78 MIL/uL (ref 3.87–5.11)
RDW: 13.5 % (ref 11.5–15.5)
WBC: 12.6 10*3/uL — ABNORMAL HIGH (ref 4.0–10.5)
nRBC: 0 % (ref 0.0–0.2)

## 2021-01-03 LAB — LACTIC ACID, PLASMA
Lactic Acid, Venous: 1.3 mmol/L (ref 0.5–1.9)
Lactic Acid, Venous: 1.2 mmol/L (ref 0.5–1.9)

## 2021-01-03 LAB — APTT: aPTT: 40 seconds — ABNORMAL HIGH (ref 24–36)

## 2021-01-03 LAB — MAGNESIUM: Magnesium: 2.1 mg/dL (ref 1.7–2.4)

## 2021-01-03 LAB — TROPONIN I (HIGH SENSITIVITY)
Troponin I (High Sensitivity): 7 ng/L (ref <18)
Troponin I (High Sensitivity): 5 ng/L (ref <18)

## 2021-01-03 LAB — HEPARIN LEVEL (UNFRACTIONATED): Heparin Unfractionated: >1.1 IU/mL — ABNORMAL HIGH (ref 0.30–0.70)

## 2021-01-03 LAB — BRAIN NATRIURETIC PEPTIDE: B Natriuretic Peptide: 108.1 pg/mL — ABNORMAL HIGH (ref 0.0–100.0)

## 2021-01-03 LAB — PROCALCITONIN: Procalcitonin: 0.11 ng/mL

## 2021-01-03 MED ORDER — HEPARIN (PORCINE) 25000 UT/250ML-% IV SOLN
1350.0000 [IU]/h | INTRAVENOUS | Status: DC
  Administered 2021-01-03: 1000 [IU]/h via INTRAVENOUS
  Administered 2021-01-04: 1200 [IU]/h via INTRAVENOUS
  Administered 2021-01-05: 1450 [IU]/h via INTRAVENOUS
  Administered 2021-01-06: 1350 [IU]/h via INTRAVENOUS
  Filled 2021-01-03 (×4): qty 250

## 2021-01-03 MED ORDER — ONDANSETRON HCL 4 MG/2ML IJ SOLN: 4.0000 mg | Freq: Four times a day (QID) | INTRAMUSCULAR | Status: DC | PRN

## 2021-01-03 MED ORDER — SODIUM CHLORIDE 0.9 % IV SOLN
500.0000 mg | Freq: Once | INTRAVENOUS | Status: AC
  Administered 2021-01-03: 500 mg via INTRAVENOUS
  Filled 2021-01-03 (×2): qty 500

## 2021-01-03 MED ORDER — SODIUM CHLORIDE 0.9 % IV SOLN
1.0000 g | INTRAVENOUS | Status: AC
  Administered 2021-01-04 – 2021-01-07 (×4): 1 g via INTRAVENOUS
  Filled 2021-01-03 (×4): qty 10

## 2021-01-03 MED ORDER — POLYVINYL ALCOHOL 1.4 % OP SOLN
1.0000 [drp] | OPHTHALMIC | Status: DC | PRN
  Administered 2021-01-04: 1 [drp] via OPHTHALMIC
  Filled 2021-01-03 (×2): qty 15

## 2021-01-03 MED ORDER — DIGOXIN 250 MCG PO TABS
0.2500 mg | ORAL_TABLET | ORAL | Status: AC
Start: 2021-01-03 — End: 2021-01-04
  Administered 2021-01-03 – 2021-01-04 (×3): 0.25 mg via ORAL
  Filled 2021-01-03 (×3): qty 1

## 2021-01-03 MED ORDER — FUROSEMIDE 10 MG/ML IJ SOLN
40.0000 mg | Freq: Every day | INTRAMUSCULAR | Status: DC
  Administered 2021-01-03 – 2021-01-04 (×2): 40 mg via INTRAVENOUS
  Filled 2021-01-03 (×2): qty 4

## 2021-01-03 MED ORDER — METOPROLOL TARTRATE 25 MG PO TABS
25.0000 mg | ORAL_TABLET | Freq: Once | ORAL | Status: AC
  Administered 2021-01-03: 25 mg via ORAL
  Filled 2021-01-03: qty 1

## 2021-01-03 MED ORDER — ONDANSETRON HCL 4 MG PO TABS: 4.0000 mg | ORAL_TABLET | Freq: Four times a day (QID) | ORAL | Status: DC | PRN

## 2021-01-03 MED ORDER — ALBUTEROL SULFATE (2.5 MG/3ML) 0.083% IN NEBU
2.5000 mg | INHALATION_SOLUTION | Freq: Four times a day (QID) | RESPIRATORY_TRACT | Status: DC | PRN
  Administered 2021-01-05 – 2021-01-07 (×2): 2.5 mg via RESPIRATORY_TRACT
  Filled 2021-01-03 (×2): qty 3

## 2021-01-03 MED ORDER — FUROSEMIDE 10 MG/ML IJ SOLN: 20.0000 mg | Freq: Once | INTRAMUSCULAR | Status: DC

## 2021-01-03 MED ORDER — SODIUM CHLORIDE 0.9 % IV SOLN
1.0000 g | Freq: Once | INTRAVENOUS | Status: AC
  Administered 2021-01-03: 1 g via INTRAVENOUS
  Filled 2021-01-03: qty 10

## 2021-01-03 MED ORDER — ACETAMINOPHEN 650 MG RE SUPP: 650.0000 mg | Freq: Four times a day (QID) | RECTAL | Status: DC | PRN

## 2021-01-03 MED ORDER — SODIUM CHLORIDE 0.9 % IV SOLN: 1.0000 g | Freq: Once | INTRAVENOUS | Status: DC

## 2021-01-03 MED ORDER — SODIUM CHLORIDE 0.9% FLUSH
3.0000 mL | Freq: Two times a day (BID) | INTRAVENOUS | Status: DC
  Administered 2021-01-03 – 2021-01-11 (×10): 3 mL via INTRAVENOUS

## 2021-01-03 MED ORDER — ACETAMINOPHEN 325 MG PO TABS
650.0000 mg | ORAL_TABLET | Freq: Four times a day (QID) | ORAL | Status: DC | PRN
  Administered 2021-01-04 – 2021-01-07 (×3): 650 mg via ORAL
  Filled 2021-01-03 (×3): qty 2

## 2021-01-03 MED ORDER — DILTIAZEM HCL-DEXTROSE 125-5 MG/125ML-% IV SOLN (PREMIX)
5.0000 mg/h | INTRAVENOUS | Status: DC
  Administered 2021-01-03: 5 mg/h via INTRAVENOUS
  Filled 2021-01-03: qty 125

## 2021-01-03 MED ORDER — DILTIAZEM LOAD VIA INFUSION
10.0000 mg | Freq: Once | INTRAVENOUS | Status: AC
  Administered 2021-01-03: 10 mg via INTRAVENOUS
  Filled 2021-01-03: qty 10

## 2021-01-03 MED ORDER — METOPROLOL TARTRATE 25 MG PO TABS
25.0000 mg | ORAL_TABLET | Freq: Four times a day (QID) | ORAL | Status: DC
  Administered 2021-01-03 – 2021-01-11 (×24): 25 mg via ORAL
  Filled 2021-01-03 (×25): qty 1

## 2021-01-03 NOTE — ED Triage Notes (Signed)
Pt BIB [facility] from home c/o [facility] diaphoretic and generalized weakness. Medic on scene stated pt was in uncontrolled a-fib in the 140's. Pt received 250 ml NS with EMS. BP in the 140's. Pt denies any pain at this time.

## 2021-01-03 NOTE — Consult Note (Addendum)
Cardiology Consultation:   Patient ID: [name] MRN: [id number]; DOB: [birth year]  Admit date: 01/03/2021 Date of Consult: 01/03/2021  PCP:  Daisy Floro, MD   [facility] Providers Cardiologist:  [name], MD  Sleep Medicine:  Armanda Magic, MD  {   Patient Profile:   Rebecca Short is a 80 y.o. female with PMH of permanent atrial fibrillation, macular degeneration, OSA on CPAP, obesity, chronic back pain, who is being seen 01/03/2021 for the evaluation of A. fib with RVR at the request of Dr. Katrinka Blazing.  History of Present Illness:   Ms. Borrero follows Dr. Eldridge Dace for permanent A. fib and Dr. Mayford Knife for OSA.  She is historically maintained on metoprolol and Eliquis for permanent atrial fibrillation.  Antiarrhythmic therapy and cardioversion were not pursued as she was lacking symptoms historically.  In 08/18/2017 office visit, she was noted to spending more time in A. fib then, although remains lacking symptoms. She is compliant with CPAP at night for OSA.  It does not appears any ischemic workup was done in the past and she had no agnina symptoms.   She was recently hospitalized here from 12/23/20 to 12/25/20 for severe neck and back pain and A. fib with RVR, complicated by hypotension.  Echo from 12/24/2020 revealed EF 50 to 55%, no RWMA, mild LVH, diastolic parameter indeterminate, moderate pericardial effusion measuring up to 1 cm adjacent to the LV posterior wall without evidence of tamponade. HS trop negative x2. BNP 190. CXR showed no acute finding. CTA showed no PE or aortic dissection, right base atelectasis and left lower lobe atelectasis/infiltrate.  CT L-spine revealed lumbar spondylosis. She was treated with IV diltiazem and eventually transition to oral metoprolol 25mg  BID.  She was medicated for her severe musculature back pain.  Presented to the ER today complaining shortness of breath on exertion, heart palpitation, diaphoresis, generalized weakness, and associated  chest discomfort.  EMS found patient to be in A. fib with RVR with ventricular rate of 140s. She states she has not been feeling well since 12/24/20. She reports progressive worsening SOB, initially occurring with exertional activity, now seems occurring with just talking. She noted weight gain 3 pounds over the past 1 month. She has chronic swelling of LLE, but new onset of RLE swelling. She noted almost daily experience of heart palpitation and racing sensation, thinking she is in A fib. She was diaphoretic today and felt much poorly than usually and therefore came to ER. She states she had some right side pain, denied any chest pain/arm/jaw pain. She also report 3-4 days intermittent dry cough, no fever or chills, states she is very fatigued and weak. She is complaint with her medications at home.   Admission diagnostic revealed acute hyponatremia 130, BMP otherwise unremarkable.  BNP 108.  High sensitive troponin negative x2.  CBC with leukocytosis WBC 12600.  Flu and COVID negative.  TSH from 12/24/2020 WNL. CXR revealed large left pleural effusion with left basilar opacity which may represent atelectasis/pneumonia.  CT chest without contrast revealed moderate left and small right pleural effusions, increased on the left and new on the right from prior CT. adjacent bibasal and lingula atelectasis.  Increased, moderate sized pericardial effusion, biatrial enlargement, mild pulmonary edema.  She is tachycardic with heart rate up to 147's, tachypneic with respiration 30s, hypotensive with blood pressure down to 86/64, and nonhypoxic at ED. She was given ceftriaxone and azithromycin at ED with concern of CAP.  She was started on diltiazem drip  at ED for A. fib RVR.  She is currently admitted to hospital medicine service, cardiology consult was requested for A. Fib.    Past Medical History:  Diagnosis Date   A-fib (HCC)    Allergy    Atrial fibrillation (HCC)    Macular degeneration    OSA (obstructive sleep  apnea)    mild with total AHI 7.25/hr and severe during REM sleep at 35/hr now on CPAP at 6cm H2O    Past Surgical History:  Procedure Laterality Date   CHOLECYSTECTOMY     REPLACEMENT TOTAL KNEE BILATERAL       Home Medications:  Prior to Admission medications   Medication Sig Start Date End Date Taking? Authorizing Provider  acetaminophen (TYLENOL) 325 MG tablet Take 2 tablets (650 mg total) by mouth every 6 (six) hours as needed for moderate pain or mild pain. 12/25/20  Yes Arrien, York Ram, MD  CALCIUM CITRATE-VITAMIN D PO Take 1 tablet by mouth at bedtime.   Yes [provider]  carboxymethylcellulose (REFRESH PLUS) 0.5 % SOLN Place 1 drop into both eyes 2 (two) times daily.   Yes [provider]  cholecalciferol (VITAMIN D3) 25 MCG (1000 UNIT) tablet Take 1,000 Units by mouth daily.   Yes [provider]  ELIQUIS 5 MG TABS tablet TAKE 1 TABLET BY MOUTH TWICE A DAY Patient taking differently: Take 5 mg by mouth 2 (two) times daily. 12/04/20  Yes Corky Crafts, MD  EPIPEN 2-PAK 0.3 MG/0.3ML SOAJ injection Inject 0.3 mg into the muscle once as needed for anaphylaxis (severe allergic reaction). 07/05/14  Yes [provider]  metoprolol tartrate (LOPRESSOR) 25 MG tablet TAKE 1 TABLET BY MOUTH TWICE A DAY Patient taking differently: Take 25 mg by mouth 2 (two) times daily. 10/02/20  Yes Corky Crafts, MD  Multiple Vitamin (MULTIVITAMIN WITH MINERALS) TABS tablet Take 1 tablet by mouth every morning.   Yes [provider]  Multiple Vitamins-Minerals (PRESERVISION AREDS PO) Take 1 capsule by mouth 2 (two) times daily.   Yes [provider]  PRESCRIPTION MEDICATION every 14 (fourteen) days. Allergy shots administered by Dr. SeaTac Callas   Yes [provider]  cyclobenzaprine (FLEXERIL) 5 MG tablet Take 1 tablet (5 mg total) by mouth 3 (three) times daily as needed for muscle spasms (neck spasms). Patient not taking:  Reported on 01/03/2021 12/25/20   Arrien, York Ram, MD    Inpatient Medications: Scheduled Meds:  furosemide  40 mg Intravenous Daily   metoprolol tartrate  25 mg Oral Q6H   sodium chloride flush  3 mL Intravenous Q12H   Continuous Infusions:   PRN Meds:   Allergies:    Allergies  Allergen Reactions   Azithromycin Other (See Comments)    Pain in arm with IV infusion    Condrolite [Glucosamine-Chondroitin-Msm] Rash    Social History:   Social History   Socioeconomic History   Marital status: Married    Spouse name: Not on file   Number of children: Not on file   Years of education: Not on file   Highest education level: Not on file  Occupational History   Not on file  Tobacco Use   Smoking status: Never   Smokeless tobacco: Never  Vaping Use   Vaping Use: Never used  Substance and Sexual Activity   Alcohol use: Never   Drug use: Never   Sexual activity: Not on file  Other Topics Concern   Not on file  Social History Narrative   **  Merged History Encounter **       Social Determinants of Health   Financial Resource Strain: Not on file  Food Insecurity: Not on file  Transportation Needs: Not on file  Physical Activity: Not on file  Stress: Not on file  Social Connections: Not on file  Intimate Partner Violence: Not on file    Family History:    Family History  Problem Relation Age of Onset   Osteoporosis Mother    Heart Problems Father    Stroke Father    Cancer Maternal Grandmother    Dementia Paternal Grandmother    Kidney failure Brother    Suicidality Other    Diabetes Brother    Heart attack Neg Hx      ROS:  Constitutional: Denied fever, chills, malaise, night sweats Eyes: Denied vision change or loss Ears/Nose/Mouth/Throat: Denied ear ache, sore throat, sinus pain Cardiovascular: see HPI  Respiratory: see HPI  Gastrointestinal: Denied nausea, vomiting, abdominal pain, diarrhea Genital/Urinary: Denied dysuria, hematuria, urinary  frequency/urgency Musculoskeletal: Denied muscle ache, joint pain Skin: Denied rash, wound Neuro: Denied headache, dizziness, syncope Psych: Denied history of depression/anxiety  Endocrine: Denied history of diabetes   Physical Exam/Data:   Vitals:   01/03/21 1452 01/03/21 1515 01/03/21 1600 01/03/21 1615  BP: 91/68 96/66 (!) 110/55 116/69  Pulse: (!) 101 (!) 105 (!) 118   Resp: 19 (!) 30 (!) 26 (!) 27  Temp:      TempSrc:      SpO2: 94% 94% 95% 94%  Weight:      Height:        Intake/Output Summary (Last 24 hours) at 01/03/2021 1632 Last data filed at 01/03/2021 1502 Gross per 24 hour  Intake 368.32 ml  Output --  Net 368.32 ml   Last 3 Weights 01/03/2021 12/24/2020 08/07/2020  Weight (lbs) 185 lb 179 lb 7.3 oz 180 lb  Weight (kg) 83.915 kg 81.4 kg 81.647 kg     Body mass index is 33.84 kg/m.   Vitals:  Vitals:   01/03/21 1600 01/03/21 1615  BP: (!) 110/55 116/69  Pulse: (!) 118   Resp: (!) 26 (!) 27  Temp:    SpO2: 95% 94%   General Appearance: In no apparent distress, laying in bed, fatigued  HEENT: Normocephalic, atraumatic. EOMs intact. Cardiovascular: Irregularly irregular, normal S1S2, no murmur ; JV 7-8 cm with HOB at 30 degree  Respiratory: Resting breathing unlabored, lungs sounds with bilateral crackles, no use of accessory muscles. On room air.  Pox 93%, mild DOE with conversation, able to speak full sentence  Gastrointestinal: Bowel sounds positive, abdomen soft, non-tender, non-distended.  Extremities: Able to move all extremities in bed without difficulty, 1+ edema of BLE Genitourinary:  genital exam not performed Musculoskeletal: Normal muscle bulk and tone, generalized weakness  Skin: Intact, warm, dry. No rashes or petechiae noted in exposed areas.  Neurologic: Alert, oriented to person, place and time. Fluent speech, no cognitive deficit, no gross focal neuro deficit Psychiatric: Normal affect. Mood is appropriate.   EKG:  The EKG was personally  reviewed and demonstrates:  A fib with RVR with ventricular rate of 151 bpm  Telemetry:  Telemetry was personally reviewed and demonstrates:  A fib RVR with ventricular rate 120s  Relevant CV Studies:  Echo from 12/24/20:   1. Technically difficult study. Left ventricular ejection fraction, by  estimation, is 50 to 55%. The left ventricle has normal function. The left  ventricle has no regional wall motion abnormalities. There is  mild left  ventricular hypertrophy. Left  ventricular diastolic parameters are indeterminate.   2. Right ventricular systolic function is normal. The right ventricular  size is mildly enlarged. Tricuspid regurgitation signal is inadequate for  assessing PA pressure.   3. Moderate pericardial effusion measuring up to 1 cm adjacent to LV  posterior wall. No evidence of tamponade   4. The mitral valve is normal in structure. No evidence of mitral valve  regurgitation.   5. The aortic valve was not well visualized. Aortic valve regurgitation  is not visualized.    Laboratory Data:  High Sensitivity Troponin:   Recent Labs  Lab 12/23/20 1334 12/23/20 1513 01/03/21 0951 01/03/21 1338  TROPONINIHS 2 2 7 5      Chemistry Recent Labs  Lab 01/03/21 0951  NA 130*  K 4.3  CL 96*  CO2 25  GLUCOSE 121*  BUN 12  CREATININE 0.85  CALCIUM 8.6*  GFRNONAA >60  ANIONGAP 9    No results for input(s): PROT, ALBUMIN, AST, ALT, ALKPHOS, BILITOT in the last 168 hours. Hematology Recent Labs  Lab 01/03/21 0951  WBC 12.6*  RBC 4.78  HGB 14.2  HCT 42.9  MCV 89.7  MCH 29.7  MCHC 33.1  RDW 13.5  PLT 293   BNP Recent Labs  Lab 01/03/21 0951  BNP 108.1*    DDimer No results for input(s): DDIMER in the last 168 hours.   Radiology/Studies:  CT Chest Wo Contrast  Result Date: 01/03/2021 CLINICAL DATA:  Chest pain or SOB, pleurisy or effusion suspected pleural effusion, concern for pna vs fluid EXAM: CT CHEST WITHOUT CONTRAST TECHNIQUE: Multidetector  CT imaging of the chest was performed following the standard protocol without IV contrast. COMPARISON:  CT 12/23/2020. FINDINGS: Cardiovascular: Biatrial enlargement.Increased, moderate size effusion.Normal size main and branch pulmonary arteries.Minimal thoracic aortic atherosclerotic calcifications. Mediastinum/Nodes: Unchanged calcified left hilar lymph node. No other lymphadenopathy.The thyroid is unremarkable.Esophagus is mildly dilated.The trachea is unremarkable. Lungs/Pleura: There is a moderate left and small right pleural effusions, increased on the left and new on the right comparison to prior exam. There is adjacent bibasilar and lingular atelectasis.Interlobular septal thickening ground-glass opacities.No suspicious pulmonary nodules or masses. No pneumothorax. Upper Abdomen: Calcified splenic granulomas.  No acute abnormality. Musculoskeletal: No acute osseous abnormality.There is a T6 vertebral body hemangioma. Bridging anterior fights and shin and fused sternum compatible with diffuse idiopathic skeletal hyperostosis. IMPRESSION: Moderate left and small right pleural effusions, which is increased on the left and new on the right in comparison to prior CT. Adjacent bibasilar and lingular atelectasis. Increased, moderate-sized pericardial effusion. Biatrial enlargement. Mild pulmonary edema. Electronically Signed   By: Caprice Renshaw M.D.   On: 01/03/2021 13:22   DG Chest Portable 1 View  Result Date: 01/03/2021 CLINICAL DATA:  Weakness EXAM: PORTABLE CHEST 1 VIEW COMPARISON:  12/23/2020 FINDINGS: Heart size appears enlarged, although is largely obscured. Atherosclerotic calcification of the aortic knob. Large left pleural effusion with left basilar opacity. Right lung appears clear. No pneumothorax. IMPRESSION: Large left pleural effusion with left basilar opacity, which may represent atelectasis and/or pneumonia. Electronically Signed   By: Duanne Guess D.O.   On: 01/03/2021 10:20      Assessment and Plan:   Permanent A. fib with RVR - Presented with shortness of breath, generalized weakness, heart palpitation, diaphoresis for 10 days  - EKG with A fib RVR with ventricular rate of 151 bpm - HS trop negative x2, BNP 108, POA - TSH WNL 12/24/20 ; K 4.3,  Mag 2.1, POA - Chest imaging with L>R pleural effusion, moderate pericardial effusion, ? 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 Fib, start Lasix  - will repeat Echo   Suspected community-acquired pneumonia -Presented with above symptoms -WBC 12 600, POA -Chest imaging with concern of pneumonia - will check lactic acid, procalcitonin - consider additional work-up including blood culture, strep pneumoniae/urine Legionella antigen, MRSA swab, defer to IM - s/p IV ceftriaxone and azithromycin, will defer further antibiotic due to IM  OSA -Resume CPAP at night  Acute hyponatremia Chronic musculature pain -Defer management to IM   Risk Assessment/Risk Scores:        New York Heart Association (NYHA) Functional Class NYHA Class III  CHA2DS2-VASc Score = 3  This indicates a 3.2% annual risk of stroke. The patient's score is based upon: CHF History: No HTN History: No Diabetes History:  No Stroke History: No Vascular Disease History: No Age Score: 2 Gender Score: 1        For questions or updates, please contact [facility] Please consult [email or web address] for contact info under    Signed, Cyndi Bender, NP  01/03/2021 4:32 PM  I have seen and examined the patient along with Cyndi Bender, NP  .  I have reviewed the chart, notes and new data.  I agree with PA/NP's note.  Key new complaints: Symptom onset was with a dry cough, waves of diaphoresis/clammy sensation and weakness.  She has not had a true fever.  She subsequently became short of breath and had vague atypical chest discomfort.  She has had little worsening of lower extremity swelling and some weight gain.  She has noticed palpitations, which usually do not bother her even though she has permanent atrial fibrillation. Key examination changes: Became hypotensive with intravenous diltiazem.  Remains tachycardic A. fib with RVR of 130s.  Does not look particularly uncomfortable.  Severely diminished breath sounds and egophony in the left lower lung, dullness to palpation and diminished breath sounds in the right lower lung.  Heart sounds are not particularly muffled.  There are no murmurs or rubs.  There is mild symmetrical ankle swelling. Key new findings / data: Telemetry shows atrial fibrillation with ventricular rate around 130 bpm.  ECG is similar, otherwise unrevealing with nonspecific repolarization changes.  The voltage is relatively low but this could be due to either pericardial effusion or obesity.  Similar low voltage was seen on electrocardiogram from March before she had a diagnosed pericardial effusion.  Reviewed the echocardiogram December 24, 2020.  The pericardial effusion was small to moderate and there was no evidence of tamponade at that time.  PLAN:  It appears most likely that she has a left lower lobe pneumonia where there are striking changes on physical exam and a dense infiltrate  on the chest x-ray and  CT.    The presence of persistent tachycardia (A. fib with RVR) and hypotension when she was administered diltiazem raise the concern for tamponade, but otherwise her physical exam does not really support this.  A difficult diagnosis to make with the rapid irregular rhythm.  Unable to increase the dose of beta-blocker or give diltiazem due to her tendency to hypotension.  We will plan to add digoxin.   Repeat the echo to assess for enlarging pericardial effusion. She has started antibiotics.  Thurmon Fair, MD, [facility] [phone] 01/03/2021, 5:35 PM

## 2021-01-03 NOTE — Progress Notes (Signed)
ANTICOAGULATION CONSULT NOTE - Initial Consult  Pharmacy Consult for Heparin Indication: atrial fibrillation  Allergies  Allergen Reactions   Azithromycin Other (See Comments)    Pain in arm with IV infusion    Condrolite [Glucosamine-Chondroitin-Msm] Rash    Patient Measurements: Height: 5\' 2"  (157.5 cm) Weight: 83.9 kg (185 lb) IBW/kg (Calculated) : 50.1 Heparin Dosing Weight: 69 kg  Vital Signs: Temp: 98 F (36.7 C) (08/11 0922) Temp Source: Oral (08/11 0922) BP: 96/66 (08/11 1515) Pulse Rate: 105 (08/11 1515)  Labs: Recent Labs    01/03/21 0951 01/03/21 1338  HGB 14.2  --   HCT 42.9  --   PLT 293  --   LABPROT 18.4*  --   INR 1.5*  --   CREATININE 0.85  --   TROPONINIHS 7 5    Estimated Creatinine Clearance: 53 mL/min (by C-G formula based on SCr of 0.85 mg/dL).   Medical History: Past Medical History:  Diagnosis Date   A-fib [facility])    Allergy    Atrial fibrillation (HCC)    Macular degeneration    OSA (obstructive sleep apnea)    mild with total AHI 7.25/hr and severe during REM sleep at 35/hr now on CPAP at 6cm H2O    Assessment: Rebecca Short, Rebecca Short is an 80 YO female who presented to the ED w/ SOB and afib. She was diaphoretic, had chest pain, and increased palpitations. She takes Eliquis 5 mg BID at home and LD was 1900 8/10. H&H and plts WNL.  Goal of Therapy:  Heparin level 0.3-0.7 units/ml Monitor platelets by anticoagulation protocol: Yes   Plan:  Start heparin infusion at 1000 units/hr Check anti-Xa level in 8 hours and daily while on heparin Continue to monitor H&H and platelets Monitor aPTT until they begin to correlate with HL. Follow-up transition back to Eliquis.   96, PharmD PGY1 Pharmacy Resident  Please check AMION for all [facility] pharmacy phone numbers After 10:00 PM call main pharmacy [phone]

## 2021-01-03 NOTE — ED Notes (Signed)
This RN was called back to the room, pt stating the IV that the zithromax was switched to was also starting to hurt. Dr. Katrinka Blazing at bedside. Antibiotic paused. Will continue to monitor.

## 2021-01-03 NOTE — ED Notes (Signed)
This RN spoke with Dr. Katrinka Blazing about pt's BP and pausing Diltiazem drip. Per Dr. Katrinka Blazing he will come see the pt. Will continue to monitor.

## 2021-01-03 NOTE — ED Provider Notes (Addendum)
[facility] EMERGENCY DEPARTMENT Provider Note   CSN: [id number] Arrival date & time: 01/03/21  0912     History Chief Complaint  Patient presents with   Atrial Fibrillation    Rebecca Short is a 80 y.o. female.  Presents to ER with concern for shortness of breath, A. fib.  Patient reports that since she was admitted in the hospital recently she is continue to have intermittent shortness of breath.  Has had progressive generalized weakness.  This morning said that she felt somewhat diaphoretic, increased shortness of breath.  Had slight chest discomfort but no chest pain at present.  States that she has frequent palpitations.  Is experiencing some palpitations right now.  No fevers or chills.  Occasional cough.  HPI     Past Medical History:  Diagnosis Date   A-fib [facility])    Allergy    Atrial fibrillation (HCC)    Macular degeneration    OSA (obstructive sleep apnea)    mild with total AHI 7.25/hr and severe during REM sleep at 35/hr now on CPAP at 6cm H2O    Patient Active Problem List   Diagnosis Date Noted   Atrial fibrillation (HCC) 12/24/2020   Atrial fibrillation with RVR (HCC) 12/23/2020   Musculoskeletal pain 12/23/2020   Posterior vitreous detachment of both eyes 04/30/2020   Macular pucker, right eye 11/08/2019   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HCC) 11/08/2019   Intermediate stage nonexudative age-related macular degeneration of both eyes 11/08/2019   Anticoagulated 11/18/2016   Bradycardia 03/23/2014   OSA (obstructive sleep apnea) 01/19/2014   Obesity (BMI 30-39.9) 01/19/2014   Paroxysmal A-fib (HCC) 10/04/2013   Nonspecific abnormal unspecified cardiovascular function study 07/21/2013   Acute gastroenteritis 07/15/2013    Past Surgical History:  Procedure Laterality Date   CHOLECYSTECTOMY     REPLACEMENT TOTAL KNEE BILATERAL       OB History   No obstetric history on  file.     Family History  Problem Relation Age of Onset   Osteoporosis Mother    Heart Problems Father    Stroke Father    Cancer Maternal Grandmother    Dementia Paternal Grandmother    Kidney failure Brother    Suicidality Other    Diabetes Brother    Heart attack Neg Hx     Social History   Tobacco Use   Smoking status: Never   Smokeless tobacco: Never  Vaping Use   Vaping Use: Never used  Substance Use Topics   Alcohol use: Never   Drug use: Never    Home Medications Prior to Admission medications   Medication Sig Start Date End Date Taking? Authorizing Provider  acetaminophen (TYLENOL) 325 MG tablet Take 2 tablets (650 mg total) by mouth every 6 (six) hours as needed for moderate pain or mild pain. 12/25/20  Yes Arrien, York Ram, MD  CALCIUM CITRATE-VITAMIN D PO Take 1 tablet by mouth at bedtime.   Yes [provider]  carboxymethylcellulose (REFRESH PLUS) 0.5 % SOLN Place 1 drop into both eyes 2 (two) times daily.   Yes [provider]  cholecalciferol (VITAMIN D3) 25 MCG (1000 UNIT) tablet Take 1,000 Units by mouth daily.   Yes [provider]  ELIQUIS 5 MG TABS tablet TAKE 1 TABLET BY MOUTH TWICE A DAY Patient taking differently: Take 5 mg by mouth 2 (two) times daily. 12/04/20  Yes Corky Crafts, MD  EPIPEN 2-PAK 0.3 MG/0.3ML [place]  injection Inject 0.3 mg into the muscle once as needed for anaphylaxis (severe allergic reaction). 07/05/14  Yes [provider]  metoprolol tartrate (LOPRESSOR) 25 MG tablet TAKE 1 TABLET BY MOUTH TWICE A DAY Patient taking differently: Take 25 mg by mouth 2 (two) times daily. 10/02/20  Yes Corky Crafts, MD  Multiple Vitamin (MULTIVITAMIN WITH MINERALS) TABS tablet Take 1 tablet by mouth every morning.   Yes [provider]  Multiple Vitamins-Minerals (PRESERVISION AREDS PO) Take 1 capsule by mouth 2 (two) times daily.   Yes [provider]  PRESCRIPTION MEDICATION  every 14 (fourteen) days. Allergy shots administered by Dr. [place] Callas   Yes [provider]  cyclobenzaprine (FLEXERIL) 5 MG tablet Take 1 tablet (5 mg total) by mouth 3 (three) times daily as needed for muscle spasms (neck spasms). Patient not taking: Reported on 01/03/2021 12/25/20   Arrien, York Ram, MD    Allergies    Condrolite [glucosamine-chondroitin-msm]  Review of Systems   Review of Systems  Constitutional:  Positive for fatigue. Negative for chills and fever.  HENT:  Negative for ear pain and sore throat.   Eyes:  Negative for pain and visual disturbance.  Respiratory:  Positive for cough and shortness of breath.   Cardiovascular:  Positive for chest pain and palpitations.  Gastrointestinal:  Negative for abdominal pain and vomiting.  Genitourinary:  Negative for dysuria and hematuria.  Musculoskeletal:  Negative for arthralgias and back pain.  Skin:  Negative for color change and rash.  Neurological:  Negative for seizures and syncope.  All other systems reviewed and are negative.  Physical Exam Updated Vital Signs BP 103/71   Pulse (!) 108   Temp 98 F (36.7 C) (Oral)   Resp (!) 29   Ht 5\' 2"  (1.575 m)   Wt 83.9 kg   SpO2 94%   BMI 33.84 kg/m   Physical Exam Vitals and nursing note reviewed.  Constitutional:      General: She is not in acute distress.    Appearance: She is well-developed.  HENT:     Head: Normocephalic and atraumatic.  Eyes:     Conjunctiva/sclera: Conjunctivae normal.  Cardiovascular:     Rate and Rhythm: Tachycardia present. Rhythm irregular.     Heart sounds: No murmur heard. Pulmonary:     Comments: Breath sounds diminshed at bases, slight tachypnea but speaks in full sentences Abdominal:     Palpations: Abdomen is soft.     Tenderness: There is no abdominal tenderness.  Musculoskeletal:     Cervical back: Neck supple.  Skin:    General: Skin is warm and dry.  Neurological:     Mental Status: She is alert.    ED  Results / Procedures / Treatments   Labs (all labs ordered are listed, but only abnormal results are displayed) Labs Reviewed  CBC WITH DIFFERENTIAL/PLATELET - Abnormal; Notable for the following components:      Result Value   WBC 12.6 (*)    Neutro Abs 11.1 (*)    Lymphs Abs 0.4 (*)    Abs Immature Granulocytes 0.08 (*)    All other components within normal limits  BASIC METABOLIC PANEL - Abnormal; Notable for the following components:   Sodium 130 (*)    Chloride 96 (*)    Glucose, Bld 121 (*)    Calcium 8.6 (*)    All other components within normal limits  PROTIME-INR - Abnormal; Notable for the following components:   Prothrombin Time 18.4 (*)  INR 1.5 (*)    All other components within normal limits  BRAIN NATRIURETIC PEPTIDE - Abnormal; Notable for the following components:   B Natriuretic Peptide 108.1 (*)    All other components within normal limits  RESP PANEL BY RT-PCR (FLU A&B, COVID) ARPGX2  MAGNESIUM  URINALYSIS, ROUTINE W REFLEX MICROSCOPIC  TROPONIN I (HIGH SENSITIVITY)  TROPONIN I (HIGH SENSITIVITY)    EKG EKG Interpretation  Date/Time:  Thursday January 03 2021 09:37:52 EDT Ventricular Rate:  151 PR Interval:    QRS Duration: 78 QT Interval:  262 QTC Calculation: 416 R Axis:   4 Text Interpretation: Atrial fibrillation with rapid V-rate Anteroseptal infarct, age indeterminate Confirmed by Norman Clay ([id number]) on 01/04/2021 9:07:56 PM  Radiology CT Chest Wo Contrast  Result Date: 01/03/2021 CLINICAL DATA:  Chest pain or SOB, pleurisy or effusion suspected pleural effusion, concern for pna vs fluid EXAM: CT CHEST WITHOUT CONTRAST TECHNIQUE: Multidetector CT imaging of the chest was performed following the standard protocol without IV contrast. COMPARISON:  CT 12/23/2020. FINDINGS: Cardiovascular: Biatrial enlargement.Increased, moderate size effusion.Normal size main and branch pulmonary arteries.Minimal thoracic aortic atherosclerotic calcifications.  Mediastinum/Nodes: Unchanged calcified left hilar lymph node. No other lymphadenopathy.The thyroid is unremarkable.Esophagus is mildly dilated.The trachea is unremarkable. Lungs/Pleura: There is a moderate left and small right pleural effusions, increased on the left and new on the right comparison to prior exam. There is adjacent bibasilar and lingular atelectasis.Interlobular septal thickening ground-glass opacities.No suspicious pulmonary nodules or masses. No pneumothorax. Upper Abdomen: Calcified splenic granulomas.  No acute abnormality. Musculoskeletal: No acute osseous abnormality.There is a T6 vertebral body hemangioma. Bridging anterior fights and shin and fused sternum compatible with diffuse idiopathic skeletal hyperostosis. IMPRESSION: Moderate left and small right pleural effusions, which is increased on the left and new on the right in comparison to prior CT. Adjacent bibasilar and lingular atelectasis. Increased, moderate-sized pericardial effusion. Biatrial enlargement. Mild pulmonary edema. Electronically Signed   By: Caprice Renshaw M.D.   On: 01/03/2021 13:22   DG Chest Portable 1 View  Result Date: 01/03/2021 CLINICAL DATA:  Weakness EXAM: PORTABLE CHEST 1 VIEW COMPARISON:  12/23/2020 FINDINGS: Heart size appears enlarged, although is largely obscured. Atherosclerotic calcification of the aortic knob. Large left pleural effusion with left basilar opacity. Right lung appears clear. No pneumothorax. IMPRESSION: Large left pleural effusion with left basilar opacity, which may represent atelectasis and/or pneumonia. Electronically Signed   By: Duanne Guess D.O.   On: 01/03/2021 10:20    Procedures .Critical Care  Date/Time: 01/03/2021 1:46 PM Performed by: Milagros Loll, MD Authorized by: Milagros Loll, MD   Critical care provider statement:    Critical care time (minutes):  44   Critical care was time spent personally by me on the following activities:  Discussions with  consultants, evaluation of patient's response to treatment, examination of patient, ordering and performing treatments and interventions, ordering and review of laboratory studies, ordering and review of radiographic studies, pulse oximetry, re-evaluation of patient's condition, obtaining history from patient or surrogate and review of old charts   Medications Ordered in ED Medications  cefTRIAXone (ROCEPHIN) 1 g in sodium chloride 0.9 % 100 mL IVPB (1 g Intravenous New Bag/Given 01/03/21 1316)  azithromycin (ZITHROMAX) 500 mg in sodium chloride 0.9 % 250 mL IVPB (has no administration in time range)  diltiazem (CARDIZEM) 1 mg/mL load via infusion 10 mg (10 mg Intravenous Bolus from Bag 01/03/21 1306)    And  diltiazem (CARDIZEM) 125 mg in dextrose  5% 125 mL (1 mg/mL) infusion (5 mg/hr Intravenous New Bag/Given 01/03/21 1306)  metoprolol tartrate (LOPRESSOR) tablet 25 mg (25 mg Oral Given 01/03/21 1048)    ED Course  I have reviewed the triage vital signs and the nursing notes.  Pertinent labs & imaging results that were available during my care of the patient were reviewed by me and considered in my medical decision making (see chart for details).    MDM Rules/Calculators/A&P                           80 year old lady presented to ER with concern for shortness of breath and palpitations.  On exam patient noted to be tachycardic but not in distress.  A. fib with RVR.  Basic labs noted for leukocytosis.  Chest x-ray with large left-sided pleural effusion as well as evidence of atelectasis or pneumonia.  Given cough, proptosis, CXR findings, started antibiotics.  Heart rate did not significantly improve after providing home metoprolol dose.  Started on diltiazem drip for rate control.  CT chest obtained to further characterize lung findings.  Radiology report and CT reported moderate left-sided effusion and atelectasis.  Due to A. fib with RVR, possible pneumonia, moderate to large pleural effusion,  will admit to medicine for further management.  Recent admission to hospitalist.  Consult TRH.  Final Clinical Impression(s) / ED Diagnoses Final diagnoses:  Atrial fibrillation with RVR (HCC)  Pleural effusion  Leukocytosis, unspecified type    Rx / DC Orders ED Discharge Orders     None        Milagros Loll, MD 01/03/21 1346    Milagros Loll, MD 01/16/21 [phone]

## 2021-01-03 NOTE — H&P (Addendum)
History and Physical    Rebecca Short QHK:[id number] DOB: 1940-10-29 DOA: 01/03/2021  Referring MD/NP/PA: Fredirick Maudlin PCP: Daisy Floro, MD  Patient coming from: Home via EMS  Chief Complaint: Cough and shortness of breath  I have personally briefly reviewed patient's old medical records in [facility]   HPI: Rebecca Short is a 80 y.o. female with medical history significant of permanent atrial fibrillation on Eliquis, obesity, and OSA on CPAP presents with complaints of cough and shortness of breath that have progressively worsened since leaving the hospital.  Hospitalized from 7/31 -8/2 with atrial fibrillation with RVR and hypotension with rate control with Cardizem drip.  Echocardiogram at that time did note patient had a moderate pericardial effusion with EF noted to be around 50-55% without tamponade features.  Cough is intermittent and nonproductive.  Reports associated symptoms of palpitations, generalized fatigue, diaphoresis, and weakness.  She intermittently has leg swelling, but denies any significant worsening.  Denies having any significant fever symptoms.  ED Course: On admission into the emergency department patient was noted to be in atrial fibrillation with heart rates elevated up to 147, respirations 19-33, blood pressures as low as 86/64 for which diltiazem drip had to be paused, and O2 saturation maintained on room air.  Review of Systems  Constitutional:  Positive for malaise/fatigue. Negative for fever.  HENT:  Negative for ear discharge and ear pain.   Eyes:  Negative for double vision and photophobia.  Respiratory:  Positive for cough and shortness of breath.   Cardiovascular:  Positive for palpitations and leg swelling.  Gastrointestinal:  Negative for abdominal pain, nausea and vomiting.  Genitourinary:  Negative for dysuria and frequency.  Musculoskeletal:  Negative for falls.  Skin:  Negative for rash.  Neurological:  Positive for weakness.  Negative for focal weakness and loss of consciousness.  Psychiatric/Behavioral:  Negative for substance abuse. The patient has insomnia.    Past Medical History:  Diagnosis Date   A-fib [facility])    Allergy    Atrial fibrillation (HCC)    Macular degeneration    OSA (obstructive sleep apnea)    mild with total AHI 7.25/hr and severe during REM sleep at 35/hr now on CPAP at 6cm H2O    Past Surgical History:  Procedure Laterality Date   CHOLECYSTECTOMY     REPLACEMENT TOTAL KNEE BILATERAL       reports that she has never smoked. She has never used smokeless tobacco. She reports that she does not drink alcohol and does not use drugs.  Allergies  Allergen Reactions   Condrolite [Glucosamine-Chondroitin-Msm] Rash    Family History  Problem Relation Age of Onset   Osteoporosis Mother    Heart Problems Father    Stroke Father    Cancer Maternal Grandmother    Dementia Paternal Grandmother    Kidney failure Brother    Suicidality Other    Diabetes Brother    Heart attack Neg Hx     Prior to Admission medications   Medication Sig Start Date End Date Taking? Authorizing Provider  acetaminophen (TYLENOL) 325 MG tablet Take 2 tablets (650 mg total) by mouth every 6 (six) hours as needed for moderate pain or mild pain. 12/25/20  Yes Arrien, York Ram, MD  CALCIUM CITRATE-VITAMIN D PO Take 1 tablet by mouth at bedtime.   Yes [provider]  carboxymethylcellulose (REFRESH PLUS) 0.5 % SOLN Place 1 drop into both eyes 2 (two) times daily.   Yes [provider]  cholecalciferol (VITAMIN D3) 25 MCG (1000 UNIT) tablet Take 1,000 Units by mouth daily.   Yes [provider]  ELIQUIS 5 MG TABS tablet TAKE 1 TABLET BY MOUTH TWICE A DAY Patient taking differently: Take 5 mg by mouth 2 (two) times daily. 12/04/20  Yes Corky Crafts, MD  EPIPEN 2-PAK 0.3 MG/0.3ML SOAJ injection Inject 0.3 mg into the muscle once as needed for anaphylaxis (severe allergic  reaction). 07/05/14  Yes [provider]  metoprolol tartrate (LOPRESSOR) 25 MG tablet TAKE 1 TABLET BY MOUTH TWICE A DAY Patient taking differently: Take 25 mg by mouth 2 (two) times daily. 10/02/20  Yes Corky Crafts, MD  Multiple Vitamin (MULTIVITAMIN WITH MINERALS) TABS tablet Take 1 tablet by mouth every morning.   Yes [provider]  Multiple Vitamins-Minerals (PRESERVISION AREDS PO) Take 1 capsule by mouth 2 (two) times daily.   Yes [provider]  PRESCRIPTION MEDICATION every 14 (fourteen) days. Allergy shots administered by Dr. Cazenovia Callas   Yes [provider]  cyclobenzaprine (FLEXERIL) 5 MG tablet Take 1 tablet (5 mg total) by mouth 3 (three) times daily as needed for muscle spasms (neck spasms). Patient not taking: Reported on 01/03/2021 12/25/20   Arrien, York Ram, MD    Physical Exam:  Constitutional: Elderly female who appears to be in some discomfort intermittently to Vitals:   01/03/21 1305 01/03/21 1315 01/03/21 1330 01/03/21 1345  BP: 118/83 (!) 115/103 103/71 107/78  Pulse: (!) 124 95 (!) 108   Resp: (!) 30 (!) 30 (!) 29 (!) 29  Temp:      TempSrc:      SpO2: 97% 95% 94% 94%  Weight:      Height:       Eyes: PERRL, lids and conjunctivae normal ENMT: Mucous membranes are moist. Posterior pharynx clear of any exudate or lesions.  Neck: normal, supple, no masses, no thyromegaly Respiratory: Mildly tachypneic with crackles appreciated in the right lung field and decreased breath sounds on the left lower lung field.  No significant wheezing appreciated. Cardiovascular: Irregular irregular, no murmurs / rubs / gallops.  Trace bilateral lower extremity edema. 2+ pedal pulses. No carotid bruits.  Abdomen: no tenderness, no masses palpated. No hepatosplenomegaly. Bowel sounds positive.  Musculoskeletal: no clubbing / cyanosis. No joint deformity upper and lower extremities. Good ROM, no contractures. Normal muscle tone.  Skin: no  rashes, lesions, ulcers. No induration Neurologic: CN 2-12 grossly intact. Sensation intact, DTR normal. Strength 5/5 in all 4.  Psychiatric: Normal judgment and insight. Alert and oriented x 3. Normal mood.     Labs on Admission: I have personally reviewed following labs and imaging studies  CBC: Recent Labs  Lab 01/03/21 0951  WBC 12.6*  NEUTROABS 11.1*  HGB 14.2  HCT 42.9  MCV 89.7  PLT 293   Basic Metabolic Panel: Recent Labs  Lab 01/03/21 0951  NA 130*  K 4.3  CL 96*  CO2 25  GLUCOSE 121*  BUN 12  CREATININE 0.85  CALCIUM 8.6*  MG 2.1   GFR: Estimated Creatinine Clearance: 53 mL/min (by C-G formula based on SCr of 0.85 mg/dL). Liver Function Tests: No results for input(s): AST, ALT, ALKPHOS, BILITOT, PROT, ALBUMIN in the last 168 hours. No results for input(s): LIPASE, AMYLASE in the last 168 hours. No results for input(s): AMMONIA in the last 168 hours. Coagulation Profile: Recent Labs  Lab 01/03/21 0951  INR 1.5*   Cardiac Enzymes: No results for input(s): CKTOTAL, CKMB, CKMBINDEX,  TROPONINI in the last 168 hours. BNP (last 3 results) No results for input(s): PROBNP in the last 8760 hours. HbA1C: No results for input(s): HGBA1C in the last 72 hours. CBG: No results for input(s): GLUCAP in the last 168 hours. Lipid Profile: No results for input(s): CHOL, HDL, LDLCALC, TRIG, CHOLHDL, LDLDIRECT in the last 72 hours. Thyroid Function Tests: No results for input(s): TSH, T4TOTAL, FREET4, T3FREE, THYROIDAB in the last 72 hours. Anemia Panel: No results for input(s): VITAMINB12, FOLATE, FERRITIN, TIBC, IRON, RETICCTPCT in the last 72 hours. Urine analysis:    Component Value Date/Time   COLORURINE YELLOW 07/15/2013 1004   APPEARANCEUR CLEAR 07/15/2013 1004   LABSPEC 1.015 07/15/2013 1004   PHURINE 7.5 07/15/2013 1004   GLUCOSEU NEGATIVE 07/15/2013 1004   HGBUR NEGATIVE 07/15/2013 1004   BILIRUBINUR NEGATIVE 07/15/2013 1004   KETONESUR NEGATIVE  07/15/2013 1004   PROTEINUR NEGATIVE 07/15/2013 1004   UROBILINOGEN 0.2 07/15/2013 1004   NITRITE NEGATIVE 07/15/2013 1004   LEUKOCYTESUR NEGATIVE 07/15/2013 1004   Sepsis Labs: Recent Results (from the past 240 hour(s))  Resp Panel by RT-PCR (Flu A&B, Covid) Nasopharyngeal Swab     Status: None   Collection Time: 01/03/21 10:14 AM   Specimen: Nasopharyngeal Swab; Nasopharyngeal(NP) swabs in vial transport medium  Result Value Ref Range Status   SARS Coronavirus 2 by RT PCR NEGATIVE NEGATIVE Final    Comment: (NOTE) SARS-CoV-2 target nucleic acids are NOT DETECTED.  The SARS-CoV-2 RNA is generally detectable in upper respiratory specimens during the acute phase of infection. The lowest concentration of SARS-CoV-2 viral copies this assay can detect is 138 copies/mL. A negative result does not preclude SARS-Cov-2 infection and should not be used as the sole basis for treatment or other patient management decisions. A negative result may occur with  improper specimen collection/handling, submission of specimen other than nasopharyngeal swab, presence of viral mutation(s) within the areas targeted by this assay, and inadequate number of viral copies(<138 copies/mL). A negative result must be combined with clinical observations, patient history, and epidemiological information. The expected result is Negative.  Fact Sheet for Patients:  BloggerCourse.com  Fact Sheet for Healthcare Providers:  SeriousBroker.it  This test is no t yet approved or cleared by the [place] FDA and  has been authorized for detection and/or diagnosis of SARS-CoV-2 by FDA under an Emergency Use Authorization (EUA). This EUA will remain  in effect (meaning this test can be used) for the duration of the COVID-19 declaration under Section 564(b)(1) of the Act, 21 U.S.C.section 360bbb-3(b)(1), unless the authorization is terminated  or revoked sooner.        Influenza A by PCR NEGATIVE NEGATIVE Final   Influenza B by PCR NEGATIVE NEGATIVE Final    Comment: (NOTE) The Xpert Xpress SARS-CoV-2/FLU/RSV plus assay is intended as an aid in the diagnosis of influenza from Nasopharyngeal swab specimens and should not be used as a sole basis for treatment. Nasal washings and aspirates are unacceptable for Xpert Xpress SARS-CoV-2/FLU/RSV testing.  Fact Sheet for Patients: BloggerCourse.com  Fact Sheet for Healthcare Providers: SeriousBroker.it  This test is not yet approved or cleared by the [place] FDA and has been authorized for detection and/or diagnosis of SARS-CoV-2 by FDA under an Emergency Use Authorization (EUA). This EUA will remain in effect (meaning this test can be used) for the duration of the COVID-19 declaration under Section 564(b)(1) of the Act, 21 U.S.C. section 360bbb-3(b)(1), unless the authorization is terminated or revoked.  Performed at [facility], [address], [place], [place] [address]      Radiological Exams on Admission: CT Chest Wo Contrast  Result Date: 01/03/2021 CLINICAL DATA:  Chest pain or SOB, pleurisy or effusion suspected pleural effusion, concern for pna vs fluid EXAM: CT CHEST WITHOUT CONTRAST TECHNIQUE: Multidetector CT imaging of the chest was performed following the standard protocol without IV contrast. COMPARISON:  CT 12/23/2020. FINDINGS: Cardiovascular: Biatrial enlargement.Increased, moderate size effusion.Normal size main and branch pulmonary arteries.Minimal thoracic aortic atherosclerotic calcifications. Mediastinum/Nodes: Unchanged calcified left hilar lymph node. No other lymphadenopathy.The thyroid is unremarkable.Esophagus is mildly dilated.The trachea is unremarkable. Lungs/Pleura: There is a moderate left and small right pleural effusions, increased on the left and new on the right comparison to prior exam. There is  adjacent bibasilar and lingular atelectasis.Interlobular septal thickening ground-glass opacities.No suspicious pulmonary nodules or masses. No pneumothorax. Upper Abdomen: Calcified splenic granulomas.  No acute abnormality. Musculoskeletal: No acute osseous abnormality.There is a T6 vertebral body hemangioma. Bridging anterior fights and shin and fused sternum compatible with diffuse idiopathic skeletal hyperostosis. IMPRESSION: Moderate left and small right pleural effusions, which is increased on the left and new on the right in comparison to prior CT. Adjacent bibasilar and lingular atelectasis. Increased, moderate-sized pericardial effusion. Biatrial enlargement. Mild pulmonary edema. Electronically Signed   By: Caprice Renshaw M.D.   On: 01/03/2021 13:22   DG Chest Portable 1 View  Result Date: 01/03/2021 CLINICAL DATA:  Weakness EXAM: PORTABLE CHEST 1 VIEW COMPARISON:  12/23/2020 FINDINGS: Heart size appears enlarged, although is largely obscured. Atherosclerotic calcification of the aortic knob. Large left pleural effusion with left basilar opacity. Right lung appears clear. No pneumothorax. IMPRESSION: Large left pleural effusion with left basilar opacity, which may represent atelectasis and/or pneumonia. Electronically Signed   By: Duanne Guess D.O.   On: 01/03/2021 10:20    EKG: Independently reviewed.  Atrial fibrillation with RVR at 151 bpm  Assessment/Plan  Acute respiratory distress secondary to pulmonary edema and bilateral pleural effusion pericardial effusion: Acute.  Patient presents with complaints of shortness of breath and cough.  Noted to have a moderate left and small right pleural effusion with moderate pericardial effusion and mild pulmonary edema.  Echocardiogram from 8/1 did note moderate pleural effusion with no tamponade features at that time and a EF of 50 to 55%.  BNP here today improved, but she was noted to have worsening fluid back up in her lungs likely related to  uncontrolled atrial fibrillation. -Admit to progressive bed -Continuous pulse oximetry with nasal cannula oxygen maintain O2 saturation greater than 92% -Lasix 40mg  IV daily -May warrant thoracentesis if symptoms do not improve with diuresis -Cardiology consulted, will follow-up for further recommendation  Permanent atrial fibrillation with RVR on chronic anticoagulation: Patient presents with atrial fibrillation with RVR with heart rates into the 140s.  This likely led to edema and bilateral pleural effusion she had been given metoprolol 25 mg p.o. x1 dose, and 10 mg of diltiazem prior to be started on diltiazem drip.  Diltiazem drip had to be held due to hypotension.  Her last dose of Eliquis was yesterday evening. -Heparin per pharmacy case of need of procedure -Goal magnesium at least 2 and potassium 4 -Discontinued Cardizem drip -Awaiting recommendations per cardiology  Transient hypotension: Systolic blood pressures noted to transiently drop into the 80s while patient had been on diltiazem drip for which it was held. -Consider bolusing small IV fluid boluses if needed to maintain goal MAP 65  Community-acquired pneumonia  patient denies any complaints of fever.  Initials chest x-ray gave concern for possible left lower lobe infiltrate.  Patient had initially been started on empiric antibiotics of Rocephin and azithromycin -Check procalcitonin -Continue Rocephin IV and consider discontinuing if infection thought to be less likely  Leukocytosis: Acute.  WBC elevated 12.1.  Possibly related to community-acquired pneumonia versus reactive in nature -Recheck CBC in a.m  OSA on CPAP -Continue CPAP nightly  Obesity: BMI 33.84 kg/m  DVT prophylaxis: Heparin per pharmacy Code Status: Full Family Communication: Husband updated at bedside Disposition Plan: Hopefully home in 1 to 2 days Consults called: Cardiology Admission status: Inpatient require more than 2 midnight stay for  pulmonary  Clydie Braun MD [facility]   If 7PM-7AM, please contact night-coverage   01/03/2021, 1:58 PM

## 2021-01-03 NOTE — ED Notes (Signed)
Pt was complaining that the IV was hurting that the zithromax was going through. This RN  Checked the IV and it seemed to be working fine but stated to the pt that I would switch it to the other IV. Will continue to monitor.

## 2021-01-03 NOTE — Plan of Care (Signed)

## 2021-01-04 ENCOUNTER — Inpatient Hospital Stay (HOSPITAL_COMMUNITY): Payer: Medicare Other

## 2021-01-04 DIAGNOSIS — I4891 Unspecified atrial fibrillation: Secondary | ICD-10-CM | POA: Diagnosis not present

## 2021-01-04 DIAGNOSIS — I313 Pericardial effusion (noninflammatory): Secondary | ICD-10-CM | POA: Diagnosis not present

## 2021-01-04 DIAGNOSIS — I3139 Other pericardial effusion (noninflammatory): Secondary | ICD-10-CM

## 2021-01-04 DIAGNOSIS — J90 Pleural effusion, not elsewhere classified: Secondary | ICD-10-CM | POA: Diagnosis not present

## 2021-01-04 DIAGNOSIS — J189 Pneumonia, unspecified organism: Secondary | ICD-10-CM | POA: Diagnosis not present

## 2021-01-04 LAB — BASIC METABOLIC PANEL
Anion gap: 9 (ref 5–15)
BUN: 16 mg/dL (ref 8–23)
CO2: 25 mmol/L (ref 22–32)
Calcium: 8.2 mg/dL — ABNORMAL LOW (ref 8.9–10.3)
Chloride: 97 mmol/L — ABNORMAL LOW (ref 98–111)
Creatinine, Ser: 0.8 mg/dL (ref 0.44–1.00)
GFR, Estimated: >60 mL/min (ref >60)
Glucose, Bld: 108 mg/dL — ABNORMAL HIGH (ref 70–99)
Potassium: 3.7 mmol/L (ref 3.5–5.1)
Sodium: 131 mmol/L — ABNORMAL LOW (ref 135–145)

## 2021-01-04 LAB — CBC
HCT: 39.3 % (ref 36.0–46.0)
Hemoglobin: 13.1 g/dL (ref 12.0–15.0)
MCH: 29.7 pg (ref 26.0–34.0)
MCHC: 33.3 g/dL (ref 30.0–36.0)
MCV: 89.1 fL (ref 80.0–100.0)
Platelets: 272 10*3/uL (ref 150–400)
RBC: 4.41 MIL/uL (ref 3.87–5.11)
RDW: 13.6 % (ref 11.5–15.5)
WBC: 9.3 10*3/uL (ref 4.0–10.5)
nRBC: 0 % (ref 0.0–0.2)

## 2021-01-04 LAB — APTT
aPTT: 44 seconds — ABNORMAL HIGH (ref 24–36)
aPTT: 47 seconds — ABNORMAL HIGH (ref 24–36)
aPTT: 79 seconds — ABNORMAL HIGH (ref 24–36)

## 2021-01-04 LAB — HEPARIN LEVEL (UNFRACTIONATED)
Heparin Unfractionated: >1.1 IU/mL — ABNORMAL HIGH (ref 0.30–0.70)
Heparin Unfractionated: 0.76 IU/mL — ABNORMAL HIGH (ref 0.30–0.70)

## 2021-01-04 MED ORDER — CHLORHEXIDINE GLUCONATE 0.12 % MT SOLN
15.0000 mL | Freq: Two times a day (BID) | OROMUCOSAL | Status: DC
  Administered 2021-01-04 – 2021-01-11 (×14): 15 mL via OROMUCOSAL
  Filled 2021-01-04 (×13): qty 15

## 2021-01-04 MED ORDER — IBUPROFEN 200 MG PO TABS
800.0000 mg | ORAL_TABLET | Freq: Three times a day (TID) | ORAL | Status: DC
  Administered 2021-01-04 – 2021-01-09 (×14): 800 mg via ORAL
  Filled 2021-01-04: qty 1
  Filled 2021-01-04: qty 4
  Filled 2021-01-04: qty 1
  Filled 2021-01-04: qty 4
  Filled 2021-01-04 (×4): qty 1
  Filled 2021-01-04: qty 4
  Filled 2021-01-04: qty 1
  Filled 2021-01-04 (×2): qty 4
  Filled 2021-01-04: qty 1

## 2021-01-04 MED ORDER — PANTOPRAZOLE SODIUM 40 MG PO TBEC
40.0000 mg | DELAYED_RELEASE_TABLET | Freq: Every day | ORAL | Status: DC
  Administered 2021-01-04 – 2021-01-05 (×2): 40 mg via ORAL
  Filled 2021-01-04 (×2): qty 1

## 2021-01-04 MED ORDER — DIGOXIN 125 MCG PO TABS
0.0625 mg | ORAL_TABLET | Freq: Every day | ORAL | Status: DC
  Administered 2021-01-04 – 2021-01-06 (×3): 0.0625 mg via ORAL
  Filled 2021-01-04 (×4): qty 1

## 2021-01-04 MED ORDER — GUAIFENESIN-DM 100-10 MG/5ML PO SYRP
5.0000 mL | ORAL_SOLUTION | ORAL | Status: DC | PRN
  Administered 2021-01-04 – 2021-01-06 (×8): 5 mL via ORAL
  Filled 2021-01-04 (×8): qty 5

## 2021-01-04 MED ORDER — GUAIFENESIN ER 600 MG PO TB12
600.0000 mg | ORAL_TABLET | Freq: Two times a day (BID) | ORAL | Status: DC
  Administered 2021-01-04 – 2021-01-11 (×15): 600 mg via ORAL
  Filled 2021-01-04 (×15): qty 1

## 2021-01-04 MED ORDER — COLCHICINE 0.6 MG PO TABS
0.6000 mg | ORAL_TABLET | Freq: Two times a day (BID) | ORAL | Status: DC
  Administered 2021-01-04 – 2021-01-11 (×14): 0.6 mg via ORAL
  Filled 2021-01-04 (×13): qty 1

## 2021-01-04 MED ORDER — DOXYCYCLINE HYCLATE 100 MG PO TABS
100.0000 mg | ORAL_TABLET | Freq: Two times a day (BID) | ORAL | Status: AC
Start: 2021-01-04 — End: 2021-01-07
  Administered 2021-01-04 – 2021-01-07 (×8): 100 mg via ORAL
  Filled 2021-01-04 (×8): qty 1

## 2021-01-04 MED ORDER — HYDROCODONE-ACETAMINOPHEN 10-325 MG PO TABS
1.0000 | ORAL_TABLET | Freq: Four times a day (QID) | ORAL | Status: DC | PRN
  Administered 2021-01-04: 1 via ORAL
  Filled 2021-01-04: qty 1

## 2021-01-04 NOTE — Progress Notes (Signed)
  Echocardiogram 2D Echocardiogram has been performed.  Rebecca Short 01/04/2021, 1:48 PM

## 2021-01-04 NOTE — Progress Notes (Signed)
PROGRESS NOTE    Rebecca Short  XHB:[id number] DOB: [birth year] DOA: 01/03/2021 PCP: Daisy Floro, MD    Chief Complaint  Patient presents with   Atrial Fibrillation    Brief Narrative:     Rebecca Short is a 80 y.o. female with medical history significant of permanent atrial fibrillation on Eliquis, obesity, and OSA on CPAP presents with complaints of cough and shortness of breath that have progressively worsened since leaving the hospital.  Hospitalized from 7/31 -8/2 with atrial fibrillation with RVR and hypotension with rate control with Cardizem drip.  Echocardiogram at that time did note patient had a moderate pericardial effusion with EF noted to be around 50-55% without tamponade features.  Her work-up was significant for A. fib with RVR on admission, and pneumonia, she is admitted for further work-up.  Assessment & Plan:   Principal Problem:   Atrial fibrillation with RVR (HCC) Active Problems:   OSA (obstructive sleep apnea)   Obesity (BMI 30-39.9)   Anticoagulated   Leukocytosis   Community acquired pneumonia  Acute heart failure with preserved EF -Evidence of volume overload, most likely provoked by tachycardia induced cardiomyopathy from A. fib with RVR -Repeat 2D echo is pending.  Closely for pericardial effusion/tamponade -Reassess as blood pressure allows. -consider Thoracentesis if no improvement, repeat chest x-ray in a.m.   Permanent atrial fibrillation with RVR -  on chronic anticoagulation, on Eliquis for possible need of procedure, and continue with heparin gtt. . -Blood pressure is low, unable to do diltiazem drip.   -Digoxin, heart rate is better controlled this morning -Blood pressure remains soft. -Goal magnesium at least 2 and potassium 4   Community-acquired pneumonia -Patient significant for left lower lobe pneumonia. -Reports cough, congestion this morning, will order her incentive spirometer, flutter valve, and Mucinex.  -Continue with  doxycycline and Rocephin.   OSA on CPAP -Continue CPAP nightly   Obesity: BMI 33.84 kg/m      DVT prophylaxis: heparin GTT Code Status: Full Family Communication: none at bedside Disposition:   Status is: Inpatient  Remains inpatient appropriate because:IV treatments appropriate due to intensity of illness or inability to take PO  Dispo: The patient is from: Home              Anticipated d/c is to: Home              Patient currently is not medically stable to d/c.   Difficult to place patient No       Consultants:  cardiology  Subjective:  Report cough, congestion and shortness of breath  Objective: Vitals:   01/04/21 0035 01/04/21 0432 01/04/21 0821 01/04/21 1148  BP: 113/70 112/63 (!) 107/59   Pulse: 96 92 89   Resp: 17  18 17   Temp: 97.8 F (36.6 C) 98.2 F (36.8 C) 97.6 F (36.4 C) 97.6 F (36.4 C)  TempSrc: Oral Oral Oral Oral  SpO2: 94%  97%   Weight:  86.3 kg    Height:        Intake/Output Summary (Last 24 hours) at 01/04/2021 1447 Last data filed at 01/04/2021 1445 Gross per 24 hour  Intake 1207.44 ml  Output 1050 ml  Net 157.44 ml   Filed Weights   01/03/21 0920 01/04/21 0432  Weight: 83.9 kg 86.3 kg    Examination:  Awake Alert, Oriented X 3, No new F.N deficits, Normal affect Diminished air entry at the bases L>R. Irr irr ,No Gallops,Rubs or new Murmurs, No Parasternal Heave +  ve B.Sounds, Abd Soft, No tenderness, No rebound - guarding or rigidity. No Cyanosis, Clubbing , +1 edema    Data Reviewed: I have personally reviewed following labs and imaging studies  CBC: Recent Labs  Lab 01/03/21 0951 01/04/21 0228  WBC 12.6* 9.3  NEUTROABS 11.1*  --   HGB 14.2 13.1  HCT 42.9 39.3  MCV 89.7 89.1  PLT 293 272    Basic Metabolic Panel: Recent Labs  Lab 01/03/21 0951 01/04/21 0228  NA 130* 131*  K 4.3 3.7  CL 96* 97*  CO2 25 25  GLUCOSE 121* 108*  BUN 12 16  CREATININE 0.85 0.80  CALCIUM 8.6* 8.2*  MG 2.1  --      GFR: Estimated Creatinine Clearance: 57.2 mL/min (by C-G formula based on SCr of 0.8 mg/dL).  Liver Function Tests: No results for input(s): AST, ALT, ALKPHOS, BILITOT, PROT, ALBUMIN in the last 168 hours.  CBG: No results for input(s): GLUCAP in the last 168 hours.   Recent Results (from the past 240 hour(s))  Resp Panel by RT-PCR (Flu A&B, Covid) Nasopharyngeal Swab     Status: None   Collection Time: 01/03/21 10:14 AM   Specimen: Nasopharyngeal Swab; Nasopharyngeal(NP) swabs in vial transport medium  Result Value Ref Range Status   SARS Coronavirus 2 by RT PCR NEGATIVE NEGATIVE Final    Comment: (NOTE) SARS-CoV-2 target nucleic acids are NOT DETECTED.  The SARS-CoV-2 RNA is generally detectable in upper respiratory specimens during the acute phase of infection. The lowest concentration of SARS-CoV-2 viral copies this assay can detect is 138 copies/mL. A negative result does not preclude SARS-Cov-2 infection and should not be used as the sole basis for treatment or other patient management decisions. A negative result may occur with  improper specimen collection/handling, submission of specimen other than nasopharyngeal swab, presence of viral mutation(s) within the areas targeted by this assay, and inadequate number of viral copies(<138 copies/mL). A negative result must be combined with clinical observations, patient history, and epidemiological information. The expected result is Negative.  Fact Sheet for Patients:  BloggerCourse.com  Fact Sheet for Healthcare Providers:  SeriousBroker.it  This test is no t yet approved or cleared by the [place] FDA and  has been authorized for detection and/or diagnosis of SARS-CoV-2 by FDA under an Emergency Use Authorization (EUA). This EUA will remain  in effect (meaning this test can be used) for the duration of the COVID-19 declaration under Section 564(b)(1) of the Act,  21 U.S.C.section 360bbb-3(b)(1), unless the authorization is terminated  or revoked sooner.       Influenza A by PCR NEGATIVE NEGATIVE Final   Influenza B by PCR NEGATIVE NEGATIVE Final    Comment: (NOTE) The Xpert Xpress SARS-CoV-2/FLU/RSV plus assay is intended as an aid in the diagnosis of influenza from Nasopharyngeal swab specimens and should not be used as a sole basis for treatment. Nasal washings and aspirates are unacceptable for Xpert Xpress SARS-CoV-2/FLU/RSV testing.  Fact Sheet for Patients: BloggerCourse.com  Fact Sheet for Healthcare Providers: SeriousBroker.it  This test is not yet approved or cleared by the [place] FDA and has been authorized for detection and/or diagnosis of SARS-CoV-2 by FDA under an Emergency Use Authorization (EUA). This EUA will remain in effect (meaning this test can be used) for the duration of the COVID-19 declaration under Section 564(b)(1) of the Act, 21 U.S.C. section 360bbb-3(b)(1), unless the authorization is terminated or revoked.  Performed at [facility], [address]  [place], [place] [address]          Radiology Studies: CT Chest Wo Contrast  Result Date: 01/03/2021 CLINICAL DATA:  Chest pain or SOB, pleurisy or effusion suspected pleural effusion, concern for pna vs fluid EXAM: CT CHEST WITHOUT CONTRAST TECHNIQUE: Multidetector CT imaging of the chest was performed following the standard protocol without IV contrast. COMPARISON:  CT 12/23/2020. FINDINGS: Cardiovascular: Biatrial enlargement.Increased, moderate size effusion.Normal size main and branch pulmonary arteries.Minimal thoracic aortic atherosclerotic calcifications. Mediastinum/Nodes: Unchanged calcified left hilar lymph node. No other lymphadenopathy.The thyroid is unremarkable.Esophagus is mildly dilated.The trachea is unremarkable. Lungs/Pleura: There is a moderate left and small right pleural  effusions, increased on the left and new on the right comparison to prior exam. There is adjacent bibasilar and lingular atelectasis.Interlobular septal thickening ground-glass opacities.No suspicious pulmonary nodules or masses. No pneumothorax. Upper Abdomen: Calcified splenic granulomas.  No acute abnormality. Musculoskeletal: No acute osseous abnormality.There is a T6 vertebral body hemangioma. Bridging anterior fights and shin and fused sternum compatible with diffuse idiopathic skeletal hyperostosis. IMPRESSION: Moderate left and small right pleural effusions, which is increased on the left and new on the right in comparison to prior CT. Adjacent bibasilar and lingular atelectasis. Increased, moderate-sized pericardial effusion. Biatrial enlargement. Mild pulmonary edema. Electronically Signed   By: Caprice Renshaw M.D.   On: 01/03/2021 13:22   DG Chest Portable 1 View  Result Date: 01/03/2021 CLINICAL DATA:  Weakness EXAM: PORTABLE CHEST 1 VIEW COMPARISON:  12/23/2020 FINDINGS: Heart size appears enlarged, although is largely obscured. Atherosclerotic calcification of the aortic knob. Large left pleural effusion with left basilar opacity. Right lung appears clear. No pneumothorax. IMPRESSION: Large left pleural effusion with left basilar opacity, which may represent atelectasis and/or pneumonia. Electronically Signed   By: Duanne Guess D.O.   On: 01/03/2021 10:20        Scheduled Meds:  chlorhexidine  15 mL Mouth Rinse BID   doxycycline  100 mg Oral Q12H   furosemide  40 mg Intravenous Daily   guaiFENesin  600 mg Oral BID   metoprolol tartrate  25 mg Oral Q6H   sodium chloride flush  3 mL Intravenous Q12H   Continuous Infusions:  cefTRIAXone (ROCEPHIN)  IV 1 g (01/04/21 0838)   heparin 1,450 Units/hr (01/04/21 1413)     LOS: 1 day      Huey Bienenstock, MD [facility]   To contact the attending provider between 7A-7P or the covering provider during after hours 7P-7A,  please log into the web site [email or web address] and access using universal [place] password for that web site. If you do not have the password, please call the hospital operator.  01/04/2021, 2:47 PM

## 2021-01-04 NOTE — Progress Notes (Signed)
ANTICOAGULATION CONSULT NOTE   Pharmacy Consult for Heparin Indication: atrial fibrillation  Allergies  Allergen Reactions   Azithromycin Other (See Comments)    Pain in arm with IV infusion    Condrolite [Glucosamine-Chondroitin-Msm] Rash    Patient Measurements: Height: 5\' 2"  (157.5 cm) Weight: 83.9 kg (185 lb) IBW/kg (Calculated) : 50.1 Heparin Dosing Weight: 69 kg  Vital Signs: Temp: 97.8 F (36.6 C) (08/12 0035) Temp Source: Oral (08/12 0035) BP: 113/70 (08/12 0035) Pulse Rate: 96 (08/12 0035)  Labs: Recent Labs    01/03/21 0951 01/03/21 1338 01/03/21 1737 01/04/21 0228  HGB 14.2  --   --  13.1  HCT 42.9  --   --  39.3  PLT 293  --   --  272  APTT  --   --  40* 44*  LABPROT 18.4*  --   --   --   INR 1.5*  --   --   --   HEPARINUNFRC  --   --  >1.10* >1.10*  CREATININE 0.85  --   --  0.80  TROPONINIHS 7 5  --   --      Estimated Creatinine Clearance: 56.3 mL/min (by C-G formula based on SCr of 0.8 mg/dL).   Medical History: Past Medical History:  Diagnosis Date   A-fib [facility])    Allergy    Atrial fibrillation (HCC)    Macular degeneration    OSA (obstructive sleep apnea)    mild with total AHI 7.25/hr and severe during REM sleep at 35/hr now on CPAP at 6cm H2O    Assessment: [facility] is an 80 YO female who presented to the ED w/ SOB and afib. She was diaphoretic, had chest pain, and increased palpitations. She takes Eliquis 5 mg BID at home and LD was 1900 8/10. H&H and plts WNL.  8/12 AM update:  aPTT is low  Goal of Therapy:  Heparin level 0.3-0.7 units/ml Monitor platelets by anticoagulation protocol: Yes   Plan:  Inc heparin to 1200 units/hr Re-check aPTT and heparin level in 8 hours Monitor aPTT until they begin to correlate with HL. Follow-up transition back to Eliquis.   10/12, PharmD, BCPS Clinical Pharmacist Phone: [phone]

## 2021-01-04 NOTE — Plan of Care (Signed)

## 2021-01-04 NOTE — Progress Notes (Signed)
Progress Note  Patient Name: Rebecca Short Date of Encounter: 01/04/2021  [facility] Cardiologist: [name], MD   Subjective   Felt improved this AM, but now has sharp pleuritic pain w each breath in right shoulder area. No dyspnea at rest. Remains in AFib (a permanent arrhythmia). Rate is well controlled, mostly 80-90s.  Inpatient Medications    Scheduled Meds:  chlorhexidine  15 mL Mouth Rinse BID   doxycycline  100 mg Oral Q12H   furosemide  40 mg Intravenous Daily   guaiFENesin  600 mg Oral BID   metoprolol tartrate  25 mg Oral Q6H   sodium chloride flush  3 mL Intravenous Q12H   Continuous Infusions:  cefTRIAXone (ROCEPHIN)  IV 1 g (01/04/21 0838)   heparin 1,450 Units/hr (01/04/21 1413)   PRN Meds: acetaminophen **OR** acetaminophen, albuterol, guaiFENesin-dextromethorphan, HYDROcodone-acetaminophen, ondansetron **OR** ondansetron (ZOFRAN) IV, polyvinyl alcohol   Vital Signs    Vitals:   01/04/21 0035 01/04/21 0432 01/04/21 0821 01/04/21 1148  BP: 113/70 112/63 (!) 107/59   Pulse: 96 92 89   Resp: 17  18 17   Temp: 97.8 F (36.6 C) 98.2 F (36.8 C) 97.6 F (36.4 C) 97.6 F (36.4 C)  TempSrc: Oral Oral Oral Oral  SpO2: 94%  97%   Weight:  86.3 kg    Height:        Intake/Output Summary (Last 24 hours) at 01/04/2021 1502 Last data filed at 01/04/2021 1445 Gross per 24 hour  Intake 939.12 ml  Output 1050 ml  Net -110.88 ml   Last 3 Weights 01/04/2021 01/03/2021 12/24/2020  Weight (lbs) 190 lb 3.2 oz 185 lb 179 lb 7.3 oz  Weight (kg) 86.274 kg 83.915 kg 81.4 kg      Telemetry    AFib, VR 80-90s - Personally Reviewed  ECG    No new tracing - Personally Reviewed  Physical Exam  Obese. Looks comfortable GEN: No acute distress.   Neck: 10 cm JVD Cardiac: irregular, no murmurs, rubs, or gallops.  Respiratory: Clear to auscultation bilaterally. GI: Soft, nontender, non-distended  MS: No edema; No deformity. Neuro:  Nonfocal  Psych:  Normal affect   Labs    High Sensitivity Troponin:   Recent Labs  Lab 12/23/20 1334 12/23/20 1513 01/03/21 0951 01/03/21 1338  TROPONINIHS 2 2 7 5       Chemistry Recent Labs  Lab 01/03/21 0951 01/04/21 0228  NA 130* 131*  K 4.3 3.7  CL 96* 97*  CO2 25 25  GLUCOSE 121* 108*  BUN 12 16  CREATININE 0.85 0.80  CALCIUM 8.6* 8.2*  GFRNONAA >60 >60  ANIONGAP 9 9     Hematology Recent Labs  Lab 01/03/21 0951 01/04/21 0228  WBC 12.6* 9.3  RBC 4.78 4.41  HGB 14.2 13.1  HCT 42.9 39.3  MCV 89.7 89.1  MCH 29.7 29.7  MCHC 33.1 33.3  RDW 13.5 13.6  PLT 293 272    BNP Recent Labs  Lab 01/03/21 0951  BNP 108.1*     DDimer No results for input(s): DDIMER in the last 168 hours.   Radiology    CT Chest Wo Contrast  Result Date: 01/03/2021 CLINICAL DATA:  Chest pain or SOB, pleurisy or effusion suspected pleural effusion, concern for pna vs fluid EXAM: CT CHEST WITHOUT CONTRAST TECHNIQUE: Multidetector CT imaging of the chest was performed following the standard protocol without IV contrast. COMPARISON:  CT 12/23/2020. FINDINGS: Cardiovascular: Biatrial enlargement.Increased, moderate size effusion.Normal size main and branch pulmonary  arteries.Minimal thoracic aortic atherosclerotic calcifications. Mediastinum/Nodes: Unchanged calcified left hilar lymph node. No other lymphadenopathy.The thyroid is unremarkable.Esophagus is mildly dilated.The trachea is unremarkable. Lungs/Pleura: There is a moderate left and small right pleural effusions, increased on the left and new on the right comparison to prior exam. There is adjacent bibasilar and lingular atelectasis.Interlobular septal thickening ground-glass opacities.No suspicious pulmonary nodules or masses. No pneumothorax. Upper Abdomen: Calcified splenic granulomas.  No acute abnormality. Musculoskeletal: No acute osseous abnormality.There is a T6 vertebral body hemangioma. Bridging anterior fights and shin and fused sternum  compatible with diffuse idiopathic skeletal hyperostosis. IMPRESSION: Moderate left and small right pleural effusions, which is increased on the left and new on the right in comparison to prior CT. Adjacent bibasilar and lingular atelectasis. Increased, moderate-sized pericardial effusion. Biatrial enlargement. Mild pulmonary edema. Electronically Signed   By: Caprice Renshaw M.D.   On: 01/03/2021 13:22   DG Chest Portable 1 View  Result Date: 01/03/2021 CLINICAL DATA:  Weakness EXAM: PORTABLE CHEST 1 VIEW COMPARISON:  12/23/2020 FINDINGS: Heart size appears enlarged, although is largely obscured. Atherosclerotic calcification of the aortic knob. Large left pleural effusion with left basilar opacity. Right lung appears clear. No pneumothorax. IMPRESSION: Large left pleural effusion with left basilar opacity, which may represent atelectasis and/or pneumonia. Electronically Signed   By: Duanne Guess D.O.   On: 01/03/2021 10:20    Cardiac Studies   Preliminary review of echo - there is a large circumferential pericardial effusion. There is no clear diastolic chamber collapse. With AFib, interpretation of respiratory flow variation is very challenging, but the mitral inflow appears to show very little variation. There are at most equivocal signs of tamponade.  Patient Profile     80 y.o. female with permanent atrial fibrillation, macular degeneration, OSA on CPAP, obesity, chronic back pain, moderate pericardial effusion, who is being seen 01/03/2021 for the evaluation of A. fib with RVR, worsening edema, exertional dyspnea and weight gain, suspected LLL pneumonia. A left pleural effusion is also seen.  Assessment & Plan    Large (and growing in size) pericardial effusion and evidence of increased right atrial pressure, but without tamponade at this point. No breast or lung masses were seen on chest CT. Effusion has been present at least since 12/23/2020, so it is not fast growing. Will start  colchicine and NSAIDs. She is on iv heparin - last dose of Eliquis was 01/02/2021 in the evening, stopped in anticipation of the possible need for pericardiocentesis or surgical window. I discussed both those procedures, including pros and cons and complications with patient and her husband. If the effusion continues to grow or if she becomes symptomatic, may need a drainage procedure over the weekend, otherwise plan limited echo on Monday and antiinflammatory therapy.  Afib is now well rate controlled on oral meds only. Dig lkevel ordered for Monday morning. Will DC the furosemide.      For questions or updates, please contact [facility] Please consult [email or web address] for contact info under        Signed, Thurmon Fair, MD  01/04/2021, 3:02 PM

## 2021-01-04 NOTE — Progress Notes (Signed)
ANTICOAGULATION CONSULT NOTE   Pharmacy Consult for Heparin Indication: atrial fibrillation  Allergies  Allergen Reactions   Azithromycin Other (See Comments)    Pain in arm with IV infusion    Condrolite [Glucosamine-Chondroitin-Msm] Rash    Patient Measurements: Height: 5\' 2"  (157.5 cm) Weight: 86.3 kg (190 lb 3.2 oz) IBW/kg (Calculated) : 50.1 Heparin Dosing Weight: 69 kg  Vital Signs: Temp: 97.5 F (36.4 C) (08/12 2037) Temp Source: Oral (08/12 2037) BP: 109/67 (08/12 2230) Pulse Rate: 80 (08/12 2230)  Labs: Recent Labs    01/03/21 0951 01/03/21 1338 01/03/21 1737 01/03/21 1737 01/04/21 0228 01/04/21 1210 01/04/21 2141  HGB 14.2  --   --   --  13.1  --   --   HCT 42.9  --   --   --  39.3  --   --   PLT 293  --   --   --  272  --   --   APTT  --   --  40*   < > 44* 47* 79*  LABPROT 18.4*  --   --   --   --   --   --   INR 1.5*  --   --   --   --   --   --   HEPARINUNFRC  --   --  >1.10*  --  >1.10* 0.76*  --   CREATININE 0.85  --   --   --  0.80  --   --   TROPONINIHS 7 5  --   --   --   --   --    < > = values in this interval not displayed.     Estimated Creatinine Clearance: 57.2 mL/min (by C-G formula based on SCr of 0.8 mg/dL).   Medical History: Past Medical History:  Diagnosis Date   A-fib [facility])    Allergy    Atrial fibrillation (HCC)    Macular degeneration    OSA (obstructive sleep apnea)    mild with total AHI 7.25/hr and severe during REM sleep at 35/hr now on CPAP at 6cm H2O    Assessment: Rebecca Short, Rebecca Short is an 80 YO female who presented to the ED w/ SOB and afib. She was diaphoretic, had chest pain, and increased palpitations. She takes Eliquis 5 mg BID at home and LD was 1900 8/10. -aPTT up to 79 on 1450 units/hr   Goal of Therapy:  Heparin level 0.3-0.7 units/ml Monitor platelets by anticoagulation protocol: Yes   Plan:  Continue heparin at 1450 units/hr Monitor aPTT until they begin to correlate with HL. Follow-up transition back to  Eliquis.  96 PharmD., BCPS Clinical Pharmacist 01/04/2021 10:46 PM

## 2021-01-04 NOTE — Progress Notes (Signed)
ANTICOAGULATION CONSULT NOTE   Pharmacy Consult for Heparin Indication: atrial fibrillation  Allergies  Allergen Reactions   Azithromycin Other (See Comments)    Pain in arm with IV infusion    Condrolite [Glucosamine-Chondroitin-Msm] Rash    Patient Measurements: Height: 5\' 2"  (157.5 cm) Weight: 86.3 kg (190 lb 3.2 oz) IBW/kg (Calculated) : 50.1 Heparin Dosing Weight: 69 kg  Vital Signs: Temp: 97.6 F (36.4 C) (08/12 1148) Temp Source: Oral (08/12 1148) BP: 107/59 (08/12 0821) Pulse Rate: 89 (08/12 0821)  Labs: Recent Labs    01/03/21 0951 01/03/21 1338 01/03/21 1737 01/04/21 0228 01/04/21 1210  HGB 14.2  --   --  13.1  --   HCT 42.9  --   --  39.3  --   PLT 293  --   --  272  --   APTT  --   --  40* 44* 47*  LABPROT 18.4*  --   --   --   --   INR 1.5*  --   --   --   --   HEPARINUNFRC  --   --  >1.10* >1.10* 0.76*  CREATININE 0.85  --   --  0.80  --   TROPONINIHS 7 5  --   --   --      Estimated Creatinine Clearance: 57.2 mL/min (by C-G formula based on SCr of 0.8 mg/dL).   Medical History: Past Medical History:  Diagnosis Date   A-fib [facility])    Allergy    Atrial fibrillation (HCC)    Macular degeneration    OSA (obstructive sleep apnea)    mild with total AHI 7.25/hr and severe during REM sleep at 35/hr now on CPAP at 6cm H2O    Assessment: Rebecca Short, Rebecca Short is an 80 YO female who presented to the ED w/ SOB and afib. She was diaphoretic, had chest pain, and increased palpitations. She takes Eliquis 5 mg BID at home and LD was 1900 8/10. -aPTT up to 47 on 1200 units/hr   Goal of Therapy:  Heparin level 0.3-0.7 units/ml Monitor platelets by anticoagulation protocol: Yes   Plan:  Inc heparin to 1450 units/hr Re-check aPTT in 8 hours Monitor aPTT until they begin to correlate with HL. Follow-up transition back to Eliquis.   96, PharmD Clinical Pharmacist **Pharmacist phone directory can now be found on [email or web address] ([email or web address]).  Listed under [facility].

## 2021-01-05 ENCOUNTER — Inpatient Hospital Stay (HOSPITAL_COMMUNITY): Payer: Medicare Other

## 2021-01-05 DIAGNOSIS — I313 Pericardial effusion (noninflammatory): Secondary | ICD-10-CM | POA: Diagnosis not present

## 2021-01-05 DIAGNOSIS — I4891 Unspecified atrial fibrillation: Secondary | ICD-10-CM | POA: Diagnosis not present

## 2021-01-05 LAB — CBC
HCT: 38.5 % (ref 36.0–46.0)
Hemoglobin: 12.6 g/dL (ref 12.0–15.0)
MCH: 29.3 pg (ref 26.0–34.0)
MCHC: 32.7 g/dL (ref 30.0–36.0)
MCV: 89.5 fL (ref 80.0–100.0)
Platelets: 252 10*3/uL (ref 150–400)
RBC: 4.3 MIL/uL (ref 3.87–5.11)
RDW: 13.7 % (ref 11.5–15.5)
WBC: 9.5 10*3/uL (ref 4.0–10.5)
nRBC: 0 % (ref 0.0–0.2)

## 2021-01-05 LAB — ECHOCARDIOGRAM COMPLETE
Height: 62 in
S' Lateral: 2.5 cm
Weight: 3043.2 oz

## 2021-01-05 LAB — BASIC METABOLIC PANEL
Anion gap: 9 (ref 5–15)
BUN: 25 mg/dL — ABNORMAL HIGH (ref 8–23)
CO2: 25 mmol/L (ref 22–32)
Calcium: 8.5 mg/dL — ABNORMAL LOW (ref 8.9–10.3)
Chloride: 98 mmol/L (ref 98–111)
Creatinine, Ser: 0.8 mg/dL (ref 0.44–1.00)
GFR, Estimated: >60 mL/min (ref >60)
Glucose, Bld: 122 mg/dL — ABNORMAL HIGH (ref 70–99)
Potassium: 3.4 mmol/L — ABNORMAL LOW (ref 3.5–5.1)
Sodium: 132 mmol/L — ABNORMAL LOW (ref 135–145)

## 2021-01-05 LAB — APTT
aPTT: 113 seconds — ABNORMAL HIGH (ref 24–36)
aPTT: 65 seconds — ABNORMAL HIGH (ref 24–36)

## 2021-01-05 LAB — HEPARIN LEVEL (UNFRACTIONATED)
Heparin Unfractionated: 0.81 IU/mL — ABNORMAL HIGH (ref 0.30–0.70)
Heparin Unfractionated: 0.54 IU/mL (ref 0.30–0.70)

## 2021-01-05 LAB — GLUCOSE, CAPILLARY: Glucose-Capillary: 158 mg/dL — ABNORMAL HIGH (ref 70–99)

## 2021-01-05 MED ORDER — POTASSIUM CHLORIDE 20 MEQ PO PACK
40.0000 meq | PACK | Freq: Once | ORAL | Status: AC
  Administered 2021-01-05: 40 meq via ORAL
  Filled 2021-01-05: qty 2

## 2021-01-05 MED ORDER — POTASSIUM CHLORIDE CRYS ER 20 MEQ PO TBCR
20.0000 meq | EXTENDED_RELEASE_TABLET | Freq: Once | ORAL | Status: AC
  Administered 2021-01-05: 20 meq via ORAL

## 2021-01-05 MED ORDER — SODIUM CHLORIDE 0.9 % IV BOLUS
250.0000 mL | INTRAVENOUS | Status: AC
  Administered 2021-01-05: 250 mL via INTRAVENOUS

## 2021-01-05 MED ORDER — FUROSEMIDE 10 MG/ML IJ SOLN
40.0000 mg | Freq: Once | INTRAMUSCULAR | Status: AC
  Administered 2021-01-05: 40 mg via INTRAVENOUS
  Filled 2021-01-05: qty 4

## 2021-01-05 MED ORDER — MIDODRINE HCL 5 MG PO TABS
10.0000 mg | ORAL_TABLET | ORAL | Status: AC
  Administered 2021-01-05: 10 mg via ORAL
  Filled 2021-01-05: qty 2

## 2021-01-05 NOTE — Progress Notes (Addendum)
PROGRESS NOTE    Rebecca Short  OZH:[id number] DOB: 16-Jun-1940 DOA: 01/03/2021 PCP: Daisy Floro, MD   Chief Complaint  Patient presents with   Atrial Fibrillation    Brief Narrative: 80 year old female with history of permanent A. fib on Eliquis, morbid obesity BMI 34 with OSA on CPAP presents with cough, shortness of breath progressively worse since leaving the hospital but he was hospitalized 7/31-8/2 for A. fib with RVR, hypotension.  Work-up in the ED showed A. fib with RVR, pneumonia and was admitted for further work-up  Subjective: Patient reports that she is having some phlegm now. Overnight afebrile, heart rate controlled saturating well on room air.  Blood pressure 100-170 SYSTOLIC Potassium 2.4 Husband at the bedside denied chest pain.  She does get short of breath when she speaks for a long time Or moves around  Assessment & Plan:  Large and growing in size pericardial effusion with evidence of increasing right atrial pressure without tamponade: No discharge lung mass on the CT, has had effusion since 7/31st.  Appreciate cardiology input on board currently trying colchicine, NSAIDs.  Limited echo being repeated on Monday.Holding PO anticoagulation  and on heparin gtt in case patient needs pericardiocentesis or surgical wound.  Suspected acute heart failure with preserved EF: Suspecting tachycardia induced cardiomyopathy in the setting of A. fib RVR.  Appreciate cardiology input on board.  Echo planned for Monday.Weight has gone up overnight -Lasix stopped 8/12 per cardiology-defer to cardiology for further Lasix.   Net IO Since Admission: 587.44 mL [01/05/21 1014]   Intake/Output Summary (Last 24 hours) at 01/05/2021 1015 Last data filed at 01/04/2021 2239 Gross per 24 hour  Intake 717 ml  Output 150 ml  Net 567 ml   Filed Weights   01/03/21 0920 01/04/21 0432 01/05/21 0642  Weight: 83.9 kg 86.3 kg 86.6 kg    Permanent atrial fibrillation with RVR, currently rate  controlled on metoprolol 25 mg every 6 hours, continue digoxin level pending for Monday.  Anticoagulation w/ heparin  Hyponatremia: Monitor Hypokalemia: replete with oral potassium OSA on CPAP: Continue same Morbid Obesity w/ BMI 34.9: Will benefit with weight loss, healthy lifestyle and PCP follow-up  Leukocytosis/Community acquired pneumonia: Presented with leukocytosis Pro-Cal 0.11.  Continue ceftriaxone, doxycycline, Mucinex, supportive measures antitussives. Recent Labs  Lab 01/03/21 0951 01/03/21 1737 01/03/21 2122 01/04/21 0228 01/05/21 0343  WBC 12.6*  --   --  9.3 9.5  LATICACIDVEN  --  1.3 1.2  --   --   PROCALCITON  --  0.11  --   --   --     Diet Order             Diet Heart Room service appropriate? Yes; Fluid consistency: Thin  Diet effective now                   Patient's Body mass index is 34.92 kg/m.  DVT prophylaxis: SCD.  Anticoagulation on hold in case patient needs pericardiocentesis Code Status:   Code Status: Full Code  Family Communication: plan of care discussed with patient at bedside. Status is: Inpatient Remains inpatient appropriate because:Ongoing diagnostic testing needed not appropriate for outpatient work up and Inpatient level of care appropriate due to severity of illness  Dispo: The patient is from: Home              Anticipated d/c is to: Home              Patient currently is not  medically stable to d/c.   Difficult to place patient No Unresulted Labs (From admission, onward)     Start     Ordered   01/07/21 0500  Digoxin level  Once,   R        01/04/21 1554   01/05/21 1830  Heparin level (unfractionated)  Once-Timed,   TIMED        01/05/21 1000   01/05/21 1830  APTT  Once-Timed,   TIMED        01/05/21 1000   01/05/21 0500  Heparin level (unfractionated)  Daily,   R      01/03/21 1659   01/05/21 0500  CBC  Daily,   R      01/04/21 1438           Medications reviewed:  Scheduled Meds:  chlorhexidine  15 mL Mouth  Rinse BID   colchicine  0.6 mg Oral BID   digoxin  0.0625 mg Oral Daily   doxycycline  100 mg Oral Q12H   guaiFENesin  600 mg Oral BID   ibuprofen  800 mg Oral TID   metoprolol tartrate  25 mg Oral Q6H   pantoprazole  40 mg Oral Daily   sodium chloride flush  3 mL Intravenous Q12H   Continuous Infusions:  cefTRIAXone (ROCEPHIN)  IV 1 g (01/05/21 0925)   heparin 1,450 Units/hr (01/05/21 0725)   Consultants:see note  Procedures:see note Antimicrobials: Anti-infectives (From admission, onward)    Start     Dose/Rate Route Frequency Ordered Stop   01/04/21 1200  cefTRIAXone (ROCEPHIN) 1 g in sodium chloride 0.9 % 100 mL IVPB  Status:  Discontinued        1 g 200 mL/hr over 30 Minutes Intravenous  Once 01/03/21 1636 01/03/21 1713   01/04/21 1000  cefTRIAXone (ROCEPHIN) 1 g in sodium chloride 0.9 % 100 mL IVPB        1 g 200 mL/hr over 30 Minutes Intravenous Every 24 hours 01/03/21 1713     01/04/21 1000  doxycycline (VIBRA-TABS) tablet 100 mg        100 mg Oral Every 12 hours 01/04/21 0738     01/03/21 1215  cefTRIAXone (ROCEPHIN) 1 g in sodium chloride 0.9 % 100 mL IVPB        1 g 200 mL/hr over 30 Minutes Intravenous  Once 01/03/21 1209 01/03/21 1355   01/03/21 1215  azithromycin (ZITHROMAX) 500 mg in sodium chloride 0.9 % 250 mL IVPB        500 mg 250 mL/hr over 60 Minutes Intravenous  Once 01/03/21 1209 01/03/21 1502      Culture/Microbiology No results found for: SDES, SPECREQUEST, CULT, REPTSTATUS  Other culture-see note  Objective: Vitals: Today's Vitals   01/05/21 0624 01/05/21 0642 01/05/21 0728 01/05/21 0917  BP: 105/62  102/70   Pulse: 76  72 85  Resp:   18   Temp:   97.6 F (36.4 C)   TempSrc:   Oral   SpO2:   96%   Weight:  86.6 kg    Height:      PainSc:        Intake/Output Summary (Last 24 hours) at 01/05/2021 1010 Last data filed at 01/04/2021 2239 Gross per 24 hour  Intake 717 ml  Output 150 ml  Net 567 ml   Filed Weights   01/03/21 0920  01/04/21 0432 01/05/21 0642  Weight: 83.9 kg 86.3 kg 86.6 kg   Weight change: 2.676 kg  Intake/Output  from previous day: 08/12 0701 - 08/13 0700 In: 717 [P.O.:717] Out: 150 [Urine:150] Intake/Output this shift: No intake/output data recorded. Filed Weights   01/03/21 0920 01/04/21 0432 01/05/21 0642  Weight: 83.9 kg 86.3 kg 86.6 kg   Examination: General exam: AAO x3, pleasant, elderly female, not in distress HEENT:Oral mucosa moist, Ear/Nose WNL grossly,dentition normal. Respiratory system: bilaterally clear breath sounds,no use of accessory muscle, non tender. Cardiovascular system: Diminished heart sounds but able to hear , irregularly irregular no JVD. Gastrointestinal system: Abdomen soft, NT,ND, BS+. Nervous System:Alert, awake, moving extremities Extremities: Bilateral leg edema, distal peripheral pulses palpable.  Skin: No rashes,no icterus. MSK: Normal muscle bulk,tone, power Data Reviewed: I have personally reviewed following labs and imaging studies CBC: Recent Labs  Lab 01/03/21 0951 01/04/21 0228 01/05/21 0343  WBC 12.6* 9.3 9.5  NEUTROABS 11.1*  --   --   HGB 14.2 13.1 12.6  HCT 42.9 39.3 38.5  MCV 89.7 89.1 89.5  PLT 293 272 252   Basic Metabolic Panel: Recent Labs  Lab 01/03/21 0951 01/04/21 0228 01/05/21 0343  NA 130* 131* 132*  K 4.3 3.7 3.4*  CL 96* 97* 98  CO2 25 25 25   GLUCOSE 121* 108* 122*  BUN 12 16 25*  CREATININE 0.85 0.80 0.80  CALCIUM 8.6* 8.2* 8.5*  MG 2.1  --   --    GFR: Estimated Creatinine Clearance: 57.3 mL/min (by C-G formula based on SCr of 0.8 mg/dL). Liver Function Tests: No results for input(s): AST, ALT, ALKPHOS, BILITOT, PROT, ALBUMIN in the last 168 hours. No results for input(s): LIPASE, AMYLASE in the last 168 hours. No results for input(s): AMMONIA in the last 168 hours. Coagulation Profile: Recent Labs  Lab 01/03/21 0951  INR 1.5*   Cardiac Enzymes: No results for input(s): CKTOTAL, CKMB, CKMBINDEX,  TROPONINI in the last 168 hours. BNP (last 3 results) No results for input(s): PROBNP in the last 8760 hours. HbA1C: No results for input(s): HGBA1C in the last 72 hours. CBG: No results for input(s): GLUCAP in the last 168 hours. Lipid Profile: No results for input(s): CHOL, HDL, LDLCALC, TRIG, CHOLHDL, LDLDIRECT in the last 72 hours. Thyroid Function Tests: No results for input(s): TSH, T4TOTAL, FREET4, T3FREE, THYROIDAB in the last 72 hours. Anemia Panel: No results for input(s): VITAMINB12, FOLATE, FERRITIN, TIBC, IRON, RETICCTPCT in the last 72 hours. Sepsis Labs: Recent Labs  Lab 01/03/21 1737 01/03/21 2122  PROCALCITON 0.11  --   LATICACIDVEN 1.3 1.2    Recent Results (from the past 240 hour(s))  Resp Panel by RT-PCR (Flu A&B, Covid) Nasopharyngeal Swab     Status: None   Collection Time: 01/03/21 10:14 AM   Specimen: Nasopharyngeal Swab; Nasopharyngeal(NP) swabs in vial transport medium  Result Value Ref Range Status   SARS Coronavirus 2 by RT PCR NEGATIVE NEGATIVE Final    Comment: (NOTE) SARS-CoV-2 target nucleic acids are NOT DETECTED.  The SARS-CoV-2 RNA is generally detectable in upper respiratory specimens during the acute phase of infection. The lowest concentration of SARS-CoV-2 viral copies this assay can detect is 138 copies/mL. A negative result does not preclude SARS-Cov-2 infection and should not be used as the sole basis for treatment or other patient management decisions. A negative result may occur with  improper specimen collection/handling, submission of specimen other than nasopharyngeal swab, presence of viral mutation(s) within the areas targeted by this assay, and inadequate number of viral copies(<138 copies/mL). A negative result must be combined with clinical observations, patient history,  and epidemiological information. The expected result is Negative.  Fact Sheet for Patients:  BloggerCourse.com  Fact Sheet for  Healthcare Providers:  SeriousBroker.it  This test is no t yet approved or cleared by the [place] FDA and  has been authorized for detection and/or diagnosis of SARS-CoV-2 by FDA under an Emergency Use Authorization (EUA). This EUA will remain  in effect (meaning this test can be used) for the duration of the COVID-19 declaration under Section 564(b)(1) of the Act, 21 U.S.C.section 360bbb-3(b)(1), unless the authorization is terminated  or revoked sooner.       Influenza A by PCR NEGATIVE NEGATIVE Final   Influenza B by PCR NEGATIVE NEGATIVE Final    Comment: (NOTE) The Xpert Xpress SARS-CoV-2/FLU/RSV plus assay is intended as an aid in the diagnosis of influenza from Nasopharyngeal swab specimens and should not be used as a sole basis for treatment. Nasal washings and aspirates are unacceptable for Xpert Xpress SARS-CoV-2/FLU/RSV testing.  Fact Sheet for Patients: BloggerCourse.com  Fact Sheet for Healthcare Providers: SeriousBroker.it  This test is not yet approved or cleared by the [place] FDA and has been authorized for detection and/or diagnosis of SARS-CoV-2 by FDA under an Emergency Use Authorization (EUA). This EUA will remain in effect (meaning this test can be used) for the duration of the COVID-19 declaration under Section 564(b)(1) of the Act, 21 U.S.C. section 360bbb-3(b)(1), unless the authorization is terminated or revoked.  Performed at [facility], [address], [place], [place] [address]      Radiology Studies: CT Chest Wo Contrast  Result Date: 01/03/2021 CLINICAL DATA:  Chest pain or SOB, pleurisy or effusion suspected pleural effusion, concern for pna vs fluid EXAM: CT CHEST WITHOUT CONTRAST TECHNIQUE: Multidetector CT imaging of the chest was performed following the standard protocol without IV contrast. COMPARISON:  CT 12/23/2020. FINDINGS: Cardiovascular:  Biatrial enlargement.Increased, moderate size effusion.Normal size main and branch pulmonary arteries.Minimal thoracic aortic atherosclerotic calcifications. Mediastinum/Nodes: Unchanged calcified left hilar lymph node. No other lymphadenopathy.The thyroid is unremarkable.Esophagus is mildly dilated.The trachea is unremarkable. Lungs/Pleura: There is a moderate left and small right pleural effusions, increased on the left and new on the right comparison to prior exam. There is adjacent bibasilar and lingular atelectasis.Interlobular septal thickening ground-glass opacities.No suspicious pulmonary nodules or masses. No pneumothorax. Upper Abdomen: Calcified splenic granulomas.  No acute abnormality. Musculoskeletal: No acute osseous abnormality.There is a T6 vertebral body hemangioma. Bridging anterior fights and shin and fused sternum compatible with diffuse idiopathic skeletal hyperostosis. IMPRESSION: Moderate left and small right pleural effusions, which is increased on the left and new on the right in comparison to prior CT. Adjacent bibasilar and lingular atelectasis. Increased, moderate-sized pericardial effusion. Biatrial enlargement. Mild pulmonary edema. Electronically Signed   By: Caprice Renshaw M.D.   On: 01/03/2021 13:22     LOS: 2 days   Lanae Boast, MD [facility]  01/05/2021, 10:10 AM

## 2021-01-05 NOTE — Progress Notes (Signed)
ANTICOAGULATION CONSULT NOTE   Pharmacy Consult for Heparin Indication: atrial fibrillation  Allergies  Allergen Reactions   Azithromycin Other (See Comments)    Pain in arm with IV infusion    Condrolite [Glucosamine-Chondroitin-Msm] Rash    Patient Measurements: Height: 5\' 2"  (157.5 cm) Weight: 86.6 kg (190 lb 14.4 oz) IBW/kg (Calculated) : 50.1 Heparin Dosing Weight: 69 kg  Vital Signs: Temp: 98.4 F (36.9 C) (08/13 1900) Temp Source: Oral (08/13 1900) BP: 107/67 (08/13 1900) Pulse Rate: 115 (08/13 1900)  Labs: Recent Labs    01/03/21 0951 01/03/21 1338 01/03/21 1737 01/04/21 0228 01/04/21 1210 01/04/21 2141 01/05/21 0343 01/05/21 1806  HGB 14.2  --   --  13.1  --   --  12.6  --   HCT 42.9  --   --  39.3  --   --  38.5  --   PLT 293  --   --  272  --   --  252  --   APTT  --   --    < > 44* 47* 79* 113* 65*  LABPROT 18.4*  --   --   --   --   --   --   --   INR 1.5*  --   --   --   --   --   --   --   HEPARINUNFRC  --   --    < > >1.10* 0.76*  --  0.81* 0.54  CREATININE 0.85  --   --  0.80  --   --  0.80  --   TROPONINIHS 7 5  --   --   --   --   --   --    < > = values in this interval not displayed.     Estimated Creatinine Clearance: 57.3 mL/min (by C-G formula based on SCr of 0.8 mg/dL).   Medical History: Past Medical History:  Diagnosis Date   A-fib [facility])    Allergy    Atrial fibrillation (HCC)    Macular degeneration    OSA (obstructive sleep apnea)    mild with total AHI 7.25/hr and severe during REM sleep at 35/hr now on CPAP at 6cm H2O    Assessment: Rebecca Short, Rebecca Short is an 80 YO female who presented to the ED w/ SOB and afib. She was diaphoretic, had chest pain, and increased palpitations. She takes Eliquis 5 mg BID at home and LD was 1900 8/10. Apixaban is on hold in anticipation for the possible need for pericardiocentesis or surgical window. Plan for echo on Monday, 01/07/21. -heparin level= 0.54   Goal of Therapy:  Heparin level 0.3-0.7  units/ml Monitor platelets by anticoagulation protocol: Yes   Plan:  -Continue heparin at 1350 units/hr -Daily heparin level and CBC  01/09/21, PharmD Clinical Pharmacist **Pharmacist phone directory can now be found on [email or web address] ([email or web address]).  Listed under [facility].

## 2021-01-05 NOTE — Progress Notes (Signed)
    Made aware by the patient's nurse she has been more short of breath this afternoon. Still on RA with appropriate oxygen saturations at this time. She does have a moderate to large pericardial effusion and CXR today showed a moderate to large left pleural effusion and increasing opacities suggestive of congestion/edema. Evaluated the patient and she is in no respiratory distress at this time but reports she did get short of breath with walking around the room earlier today. Given her effusions, will order IV Lasix 40mg  x1 along with K+ supplementation. Encouraged her to make aware if symptoms progress or do not improve with administration of Lasix.  Signed, [place], PA-C 01/05/2021, 4:50 PM

## 2021-01-05 NOTE — Progress Notes (Signed)
ANTICOAGULATION CONSULT NOTE   Pharmacy Consult for Heparin Indication: atrial fibrillation  Allergies  Allergen Reactions   Azithromycin Other (See Comments)    Pain in arm with IV infusion    Condrolite [Glucosamine-Chondroitin-Msm] Rash    Patient Measurements: Height: 5\' 2"  (157.5 cm) Weight: 86.6 kg (190 lb 14.4 oz) IBW/kg (Calculated) : 50.1 Heparin Dosing Weight: 69 kg  Vital Signs: Temp: 97.6 F (36.4 C) (08/13 0728) Temp Source: Oral (08/13 0728) BP: 102/70 (08/13 0728) Pulse Rate: 85 (08/13 0917)  Labs: Recent Labs    01/03/21 0951 01/03/21 1338 01/03/21 1737 01/04/21 0228 01/04/21 1210 01/04/21 2141 01/05/21 0343  HGB 14.2  --   --  13.1  --   --  12.6  HCT 42.9  --   --  39.3  --   --  38.5  PLT 293  --   --  272  --   --  252  APTT  --   --    < > 44* 47* 79* 113*  LABPROT 18.4*  --   --   --   --   --   --   INR 1.5*  --   --   --   --   --   --   HEPARINUNFRC  --   --    < > >1.10* 0.76*  --  0.81*  CREATININE 0.85  --   --  0.80  --   --  0.80  TROPONINIHS 7 5  --   --   --   --   --    < > = values in this interval not displayed.     Estimated Creatinine Clearance: 57.3 mL/min (by C-G formula based on SCr of 0.8 mg/dL).   Medical History: Past Medical History:  Diagnosis Date   A-fib [facility])    Allergy    Atrial fibrillation (HCC)    Macular degeneration    OSA (obstructive sleep apnea)    mild with total AHI 7.25/hr and severe during REM sleep at 35/hr now on CPAP at 6cm H2O    Assessment: [facility] is an 80 YO female who presented to the ED w/ SOB and afib. She was diaphoretic, had chest pain, and increased palpitations. She takes Eliquis 5 mg BID at home and LD was 1900 8/10. Apixaban is on hold in anticipation for the possible need for pericardiocentesis or surgical window. Plan for echo on Monday, 01/07/21.  Heparin level appears to be more closely correlated to aPTT. Will check aPTT once more to verify correlation.   Patient is supra  therapeutic with a HL of 0.81 on a gtt rate of 1450 units/hr. CBC is stable. Per convo with RN, no s/sx of bleeing and no complications.   Goal of Therapy:  Heparin level 0.3-0.7 units/ml Monitor platelets by anticoagulation protocol: Yes   Plan:  Decrease heparin rate to 1350 units/hr Repeat HL and aPTT x1 in 8h Monitor CBC and s/sx of bleeding Follow-up transition back to Eliquis.  01/09/21, Pharm.D. PGY-1 Ambulatory Care Resident Phone:[phone] 01/05/2021 9:49 AM

## 2021-01-05 NOTE — Progress Notes (Signed)
   01/05/21 1700  Assess: MEWS Score  Temp 97.9 F (36.6 C)  BP 112/76  ECG Heart Rate 94  Resp (!) 26  Level of Consciousness Alert  SpO2 98 %  O2 Device Nasal Cannula  O2 Flow Rate (L/min) 2 L/min  Assess: MEWS Score  MEWS Temp 0  MEWS Systolic 0  MEWS Pulse 0  MEWS RR 2  MEWS LOC 0  MEWS Score 2  MEWS Score Color Yellow  Assess: if the MEWS score is Yellow or Red  Were vital signs taken at a resting state? Yes  Focused Assessment Change from prior assessment (see assessment flowsheet)  Early Detection of Sepsis Score *See Row Information* Low  MEWS guidelines implemented *See Row Information* Yes  Treat  Pain Scale 0-10  Pain Score 0  Take Vital Signs  Increase Vital Sign Frequency  Yellow: Q 2hr X 2 then Q 4hr X 2, if remains yellow, continue Q 4hrs  Escalate  MEWS: Escalate Yellow: discuss with charge nurse/RN and consider discussing with provider and RRT  Notify: Charge Nurse/RN  Name of Charge Nurse/RN Notified Linda, RN  Date Charge Nurse/RN Notified 01/05/21  Time Charge Nurse/RN Notified 1730  Notify: Provider  Provider Name/Title Randall An, PA  Date Provider Notified 01/05/21  Time Provider Notified 1700  Notification Type Call  Notification Reason Change in status  Provider response At bedside;See new orders  Date of Provider Response 01/05/21  Time of Provider Response 1753  Notify: Rapid Response  Name of Rapid Response RN Notified  (Not needed at this time, was not notified.)  Document  Patient Outcome Stabilized after interventions  Progress note created (see row info) Yes    After noting pt was having increased exertional dyspnea, as well as more labored breathing and increased RR, I notified cardiology PA and received new orders for 40 mg IV Lasix, & 20 meq of Potassium. 1830 Metoprolol moved to 2100, and will keep a close eye on soft blood pressures, which are stable at this time. Crackles noted in both bases, consistent with vascular  congestion/ bilat pleural effusions on cxr. Cardiology mentioned poss thoracentesis if SOB continues to worsen and lasix is ineffective for mod-large L effusion. Placed pt on 2L of [place]  and purewick to minimize tiring from getting out of bed to use the bathroom. Will continue to monitor.

## 2021-01-05 NOTE — Progress Notes (Signed)
Progress Note  Patient Name: Rebecca Short Date of Encounter: 01/05/2021  [facility] Cardiologist: [name], MD   Subjective   In rate-controlled afib. Plan for dig level on Monday - limited echo as well. Lasix was stopped by Dr. Salena Saner yesterday.  Echo performed yesterday, the pericardial effusion is larger compared to 8/1 - now up to 3.7 cm. LVEF is higher at 65-70% - no tamponade. Suspect she will need pericardiocentesis this week.  Inpatient Medications    Scheduled Meds:  chlorhexidine  15 mL Mouth Rinse BID   colchicine  0.6 mg Oral BID   digoxin  0.0625 mg Oral Daily   doxycycline  100 mg Oral Q12H   guaiFENesin  600 mg Oral BID   ibuprofen  800 mg Oral TID   metoprolol tartrate  25 mg Oral Q6H   pantoprazole  40 mg Oral Daily   sodium chloride flush  3 mL Intravenous Q12H   Continuous Infusions:  cefTRIAXone (ROCEPHIN)  IV 1 g (01/05/21 0925)   heparin 1,350 Units/hr (01/05/21 1020)   PRN Meds: acetaminophen **OR** acetaminophen, albuterol, guaiFENesin-dextromethorphan, HYDROcodone-acetaminophen, ondansetron **OR** ondansetron (ZOFRAN) IV, polyvinyl alcohol   Vital Signs    Vitals:   01/05/21 0642 01/05/21 0728 01/05/21 0917 01/05/21 1123  BP:  102/70  112/67  Pulse:  72 85 90  Resp:  18  (!) 24  Temp:  97.6 F (36.4 C)  98.2 F (36.8 C)  TempSrc:  Oral  Oral  SpO2:  96%  96%  Weight: 86.6 kg     Height:        Intake/Output Summary (Last 24 hours) at 01/05/2021 1143 Last data filed at 01/04/2021 2239 Gross per 24 hour  Intake 717 ml  Output 150 ml  Net 567 ml   Last 3 Weights 01/05/2021 01/04/2021 01/03/2021  Weight (lbs) 190 lb 14.4 oz 190 lb 3.2 oz 185 lb  Weight (kg) 86.592 kg 86.274 kg 83.915 kg      Telemetry    AFib, VR 80-90s - Personally Reviewed  ECG    No new tracing - Personally Reviewed  Physical Exam   GEN: No acute distress.   Neck: 10 cm JVD Cardiac: irregular, no murmurs, rubs, or gallops.  Respiratory: Clear to  auscultation bilaterally. GI: Soft, nontender, non-distended  MS: No edema; No deformity. Neuro:  Nonfocal  Psych: Normal affect   Labs    High Sensitivity Troponin:   Recent Labs  Lab 12/23/20 1334 12/23/20 1513 01/03/21 0951 01/03/21 1338  TROPONINIHS 2 2 7 5       Chemistry Recent Labs  Lab 01/03/21 0951 01/04/21 0228 01/05/21 0343  NA 130* 131* 132*  K 4.3 3.7 3.4*  CL 96* 97* 98  CO2 25 25 25   GLUCOSE 121* 108* 122*  BUN 12 16 25*  CREATININE 0.85 0.80 0.80  CALCIUM 8.6* 8.2* 8.5*  GFRNONAA >60 >60 >60  ANIONGAP 9 9 9      Hematology Recent Labs  Lab 01/03/21 0951 01/04/21 0228 01/05/21 0343  WBC 12.6* 9.3 9.5  RBC 4.78 4.41 4.30  HGB 14.2 13.1 12.6  HCT 42.9 39.3 38.5  MCV 89.7 89.1 89.5  MCH 29.7 29.7 29.3  MCHC 33.1 33.3 32.7  RDW 13.5 13.6 13.7  PLT 293 272 252    BNP Recent Labs  Lab 01/03/21 0951  BNP 108.1*     DDimer No results for input(s): DDIMER in the last 168 hours.   Radiology    CT Chest Wo  Contrast  Result Date: 01/03/2021 CLINICAL DATA:  Chest pain or SOB, pleurisy or effusion suspected pleural effusion, concern for pna vs fluid EXAM: CT CHEST WITHOUT CONTRAST TECHNIQUE: Multidetector CT imaging of the chest was performed following the standard protocol without IV contrast. COMPARISON:  CT 12/23/2020. FINDINGS: Cardiovascular: Biatrial enlargement.Increased, moderate size effusion.Normal size main and branch pulmonary arteries.Minimal thoracic aortic atherosclerotic calcifications. Mediastinum/Nodes: Unchanged calcified left hilar lymph node. No other lymphadenopathy.The thyroid is unremarkable.Esophagus is mildly dilated.The trachea is unremarkable. Lungs/Pleura: There is a moderate left and small right pleural effusions, increased on the left and new on the right comparison to prior exam. There is adjacent bibasilar and lingular atelectasis.Interlobular septal thickening ground-glass opacities.No suspicious pulmonary nodules or  masses. No pneumothorax. Upper Abdomen: Calcified splenic granulomas.  No acute abnormality. Musculoskeletal: No acute osseous abnormality.There is a T6 vertebral body hemangioma. Bridging anterior fights and shin and fused sternum compatible with diffuse idiopathic skeletal hyperostosis. IMPRESSION: Moderate left and small right pleural effusions, which is increased on the left and new on the right in comparison to prior CT. Adjacent bibasilar and lingular atelectasis. Increased, moderate-sized pericardial effusion. Biatrial enlargement. Mild pulmonary edema. Electronically Signed   By: Caprice Renshaw M.D.   On: 01/03/2021 13:22    Cardiac Studies   Preliminary review of echo - there is a large circumferential pericardial effusion. There is no clear diastolic chamber collapse. With AFib, interpretation of respiratory flow variation is very challenging, but the mitral inflow appears to show very little variation. There are at most equivocal signs of tamponade.  Patient Profile     80 y.o. female with permanent atrial fibrillation, macular degeneration, OSA on CPAP, obesity, chronic back pain, moderate pericardial effusion, who is being seen 01/03/2021 for the evaluation of A. fib with RVR, worsening edema, exertional dyspnea and weight gain, suspected LLL pneumonia. A left pleural effusion is also seen.  Assessment & Plan    Large (and growing in size) pericardial effusion and evidence of increased right atrial pressure, but without tamponade at this point. No breast or lung masses were seen on chest CT. Effusion has been present at least since 12/23/2020, so it is not fast growing. Will start colchicine and NSAIDs. She is on iv heparin - last dose of Eliquis was 01/02/2021 in the evening, stopped in anticipation of the possible need for pericardiocentesis or surgical window. I discussed both those procedures, including pros and cons and complications with patient and her husband. If the effusion  continues to grow or if she becomes symptomatic, may need a drainage procedure over the weekend, otherwise plan limited echo on Monday and antiinflammatory therapy.  Afib is now well rate controlled on oral meds only. Dig lkevel ordered for Monday   Pericardial effusion is enlarging - will likely need pericardiocentesis next week - at this point, no clinical signs of tamponade.     For questions or updates, please contact [facility] Please consult [email or web address] for contact info under   Chrystie Nose, MD, Milagros Loll  [place]  [facility]  Medical Director of the Advanced Lipid Disorders &  Cardiovascular Risk Reduction Clinic Diplomate of the American Board of Clinical Lipidology Attending Cardiologist  Direct Dial: [phone]  Fax: [phone]  Website:  [email or web address]  Chrystie Nose, MD  01/05/2021, 11:43 AM

## 2021-01-06 ENCOUNTER — Inpatient Hospital Stay (HOSPITAL_COMMUNITY): Payer: Medicare Other

## 2021-01-06 DIAGNOSIS — J90 Pleural effusion, not elsewhere classified: Secondary | ICD-10-CM | POA: Diagnosis not present

## 2021-01-06 DIAGNOSIS — I313 Pericardial effusion (noninflammatory): Secondary | ICD-10-CM

## 2021-01-06 DIAGNOSIS — I9589 Other hypotension: Secondary | ICD-10-CM

## 2021-01-06 DIAGNOSIS — I4891 Unspecified atrial fibrillation: Secondary | ICD-10-CM | POA: Diagnosis not present

## 2021-01-06 DIAGNOSIS — J189 Pneumonia, unspecified organism: Secondary | ICD-10-CM

## 2021-01-06 DIAGNOSIS — I959 Hypotension, unspecified: Secondary | ICD-10-CM

## 2021-01-06 LAB — BASIC METABOLIC PANEL
Anion gap: 9 (ref 5–15)
BUN: 24 mg/dL — ABNORMAL HIGH (ref 8–23)
CO2: 22 mmol/L (ref 22–32)
Calcium: 8.2 mg/dL — ABNORMAL LOW (ref 8.9–10.3)
Chloride: 103 mmol/L (ref 98–111)
Creatinine, Ser: 1.03 mg/dL — ABNORMAL HIGH (ref 0.44–1.00)
GFR, Estimated: 55 mL/min — ABNORMAL LOW (ref >60)
Glucose, Bld: 136 mg/dL — ABNORMAL HIGH (ref 70–99)
Potassium: 4 mmol/L (ref 3.5–5.1)
Sodium: 134 mmol/L — ABNORMAL LOW (ref 135–145)

## 2021-01-06 LAB — CBC
HCT: 39.3 % (ref 36.0–46.0)
Hemoglobin: 13.1 g/dL (ref 12.0–15.0)
MCH: 29.8 pg (ref 26.0–34.0)
MCHC: 33.3 g/dL (ref 30.0–36.0)
MCV: 89.5 fL (ref 80.0–100.0)
Platelets: 308 10*3/uL (ref 150–400)
RBC: 4.39 MIL/uL (ref 3.87–5.11)
RDW: 13.7 % (ref 11.5–15.5)
WBC: 17.3 10*3/uL — ABNORMAL HIGH (ref 4.0–10.5)
nRBC: 0 % (ref 0.0–0.2)

## 2021-01-06 LAB — ECHOCARDIOGRAM LIMITED
Height: 62 in
Weight: 3070.4 oz

## 2021-01-06 LAB — BODY FLUID CELL COUNT WITH DIFFERENTIAL
Eos, Fluid: 2 %
Lymphs, Fluid: 8 %
Monocyte-Macrophage-Serous Fluid: 27 % — ABNORMAL LOW (ref 50–90)
Neutrophil Count, Fluid: 63 % — ABNORMAL HIGH (ref 0–25)
Total Nucleated Cell Count, Fluid: 12300 cu mm — ABNORMAL HIGH (ref 0–1000)

## 2021-01-06 LAB — LACTATE DEHYDROGENASE, PLEURAL OR PERITONEAL FLUID: LD, Fluid: 214 U/L — ABNORMAL HIGH (ref 3–23)

## 2021-01-06 LAB — PROTEIN, PLEURAL OR PERITONEAL FLUID: Total protein, fluid: <3 g/dL

## 2021-01-06 LAB — PROTEIN, TOTAL: Total Protein: 5.2 g/dL — ABNORMAL LOW (ref 6.5–8.1)

## 2021-01-06 LAB — HEPARIN LEVEL (UNFRACTIONATED): Heparin Unfractionated: 0.69 IU/mL (ref 0.30–0.70)

## 2021-01-06 LAB — MAGNESIUM: Magnesium: 2 mg/dL (ref 1.7–2.4)

## 2021-01-06 LAB — LACTATE DEHYDROGENASE: LDH: 164 U/L (ref 98–192)

## 2021-01-06 MED ORDER — PANTOPRAZOLE SODIUM 40 MG PO TBEC
40.0000 mg | DELAYED_RELEASE_TABLET | Freq: Two times a day (BID) | ORAL | Status: DC
  Administered 2021-01-06 – 2021-01-09 (×7): 40 mg via ORAL
  Filled 2021-01-06 (×7): qty 1

## 2021-01-06 MED ORDER — SODIUM CHLORIDE 0.9 % IV SOLN
500.0000 mL | Freq: Once | INTRAVENOUS | Status: AC
  Administered 2021-01-05: 250 mL via INTRAVENOUS

## 2021-01-06 MED ORDER — LIDOCAINE HCL (PF) 1 % IJ SOLN
INTRAMUSCULAR | Status: AC
  Filled 2021-01-06: qty 30

## 2021-01-06 MED ORDER — HEPARIN (PORCINE) 25000 UT/250ML-% IV SOLN
1350.0000 [IU]/h | INTRAVENOUS | Status: DC
  Administered 2021-01-07 (×2): 1350 [IU]/h via INTRAVENOUS
  Filled 2021-01-06 (×2): qty 250

## 2021-01-06 NOTE — Progress Notes (Signed)
Patient due to get lopressor 25 mg po, BP 106/72, HR 99. Patient to go for thoracentesis shortly with concern for hypotension.  Notified PA Strader, ordered to hold lopressor dose for noon and re-evaluate with next dose.

## 2021-01-06 NOTE — Procedures (Signed)
PROCEDURE SUMMARY:  Successful US guided left thoracentesis. Yielded 950 ml of red/amber fluid. Pt tolerated procedure well. No immediate complications.  Specimen sent for labs. CXR ordered; no post-procedure pneumothorax identified   EBL < 5 mL  Mickie Kay, NP 01/06/2021 1:39 PM

## 2021-01-06 NOTE — Progress Notes (Signed)
Patient returned from ultrasound at 1335hrs.  BP 116/88, HR 100, sating 97% on 2L [place].  Dr. Randol Kern called regarding restarting heparin gtt. Order to restart at 2000hrs.

## 2021-01-06 NOTE — Progress Notes (Signed)
Late Entry...   08/13 ~ 2300 pt notified NT not feeling well. On my assessment pt was diaphoretic, skin was pale & cool to touch, & pt would respond minimally to voice. SBP on monitor 50-60's, DBP 30-40's; manual BP obtained SBP 64, DBP inaudible. Rapid RN & on call provider w/ [facility] paged. 250 mL NS bolus started. Rapid RN arrived at bedside. Order received for additional 250 mL NS bolus & 10 mg PO midodrine. O2 at 8L bled into home CPAP. After interventions pt BP remained WNL. Scheduled PO metoprolol held for remainder of shift. Pt also unable to void since beginning of shift. Bladder scan revealed ~ 325 mL. Order for PRN I&O cath obtained. Intermittent cath volume removed 500 mL, as well as one unmeasured occurrence on bed pad. Pt asymptomatic for remaining shift. Day shift RN & charge RN updated.

## 2021-01-06 NOTE — Progress Notes (Signed)
Patient due for scheduled lopressor 25mg  po, BP 99/55, HR low 100s.  Notified Cardiology, Dr. , order to hold this dose of lopressor.

## 2021-01-06 NOTE — Progress Notes (Signed)
Patient taken to ultrasound via bed at 1220hrs.

## 2021-01-06 NOTE — Progress Notes (Signed)
Progress Note  Patient Name: Rebecca Short Date of Encounter: 01/06/2021  [facility] Cardiologist: [name], MD   Subjective   Decompensated overnight - rapid response seen for hypotension. Given lasix for worsening left pleural effusion which is moderate to large - her pericardial effusion is also large now, but no tamponade by echo on Friday (I reviewed yesterday as it had not been read on Friday). BP remains low this am - I'm concerned she is developing clinical tamponade. Will plan STAT echo at bedside.  Inpatient Medications    Scheduled Meds:  chlorhexidine  15 mL Mouth Rinse BID   colchicine  0.6 mg Oral BID   digoxin  0.0625 mg Oral Daily   doxycycline  100 mg Oral Q12H   guaiFENesin  600 mg Oral BID   ibuprofen  800 mg Oral TID   metoprolol tartrate  25 mg Oral Q6H   pantoprazole  40 mg Oral BID   sodium chloride flush  3 mL Intravenous Q12H   Continuous Infusions:  cefTRIAXone (ROCEPHIN)  IV 1 g (01/05/21 0925)   heparin 1,350 Units/hr (01/06/21 0218)   PRN Meds: acetaminophen **OR** acetaminophen, albuterol, guaiFENesin-dextromethorphan, HYDROcodone-acetaminophen, ondansetron **OR** ondansetron (ZOFRAN) IV, polyvinyl alcohol   Vital Signs    Vitals:   01/06/21 0120 01/06/21 0220 01/06/21 0602 01/06/21 0753  BP: 94/64 98/67 (!) 108/57 94/62  Pulse: 88 75  82  Resp: 19 18 18 18   Temp: 98.2 F (36.8 C) 98.4 F (36.9 C) 98.2 F (36.8 C) 97.7 F (36.5 C)  TempSrc: Oral Oral Oral Oral  SpO2: 98% 96% 96% 95%  Weight:   87 kg   Height:        Intake/Output Summary (Last 24 hours) at 01/06/2021 1011 Last data filed at 01/06/2021 0100 Gross per 24 hour  Intake 1900.79 ml  Output 580 ml  Net 1320.79 ml   Last 3 Weights 01/06/2021 01/05/2021 01/04/2021  Weight (lbs) 191 lb 14.4 oz 190 lb 14.4 oz 190 lb 3.2 oz  Weight (kg) 87.045 kg 86.592 kg 86.274 kg      Telemetry    AFib, VR 80-90s - Personally Reviewed  ECG    No new tracing -  Personally Reviewed  Physical Exam   GEN: No acute distress.   Neck: 10 cm JVD Cardiac: irregular, no murmurs, rubs, or gallops.  Respiratory: decreased breath sounds 2/3 up the left lung, dullness to percussion GI: Soft, nontender, non-distended  MS: No edema; No deformity. Neuro:  Nonfocal  Psych: Normal affect   Labs    High Sensitivity Troponin:   Recent Labs  Lab 12/23/20 1334 12/23/20 1513 01/03/21 0951 01/03/21 1338  TROPONINIHS 2 2 7 5       Chemistry Recent Labs  Lab 01/03/21 0951 01/04/21 0228 01/05/21 0343  NA 130* 131* 132*  K 4.3 3.7 3.4*  CL 96* 97* 98  CO2 25 25 25   GLUCOSE 121* 108* 122*  BUN 12 16 25*  CREATININE 0.85 0.80 0.80  CALCIUM 8.6* 8.2* 8.5*  GFRNONAA >60 >60 >60  ANIONGAP 9 9 9      Hematology Recent Labs  Lab 01/04/21 0228 01/05/21 0343 01/06/21 0220  WBC 9.3 9.5 17.3*  RBC 4.41 4.30 4.39  HGB 13.1 12.6 13.1  HCT 39.3 38.5 39.3  MCV 89.1 89.5 89.5  MCH 29.7 29.3 29.8  MCHC 33.3 32.7 33.3  RDW 13.6 13.7 13.7  PLT 272 252 308    BNP Recent Labs  Lab 01/03/21 0951  BNP 108.1*     DDimer No results for input(s): DDIMER in the last 168 hours.   Radiology    DG Chest Port 1 View  Result Date: 01/05/2021 CLINICAL DATA:  Shortness of breath.  Evaluate pleural effusion. EXAM: PORTABLE CHEST 1 VIEW COMPARISON:  January 03, 2021 FINDINGS: The cardiomediastinal silhouette is stable. No pneumothorax. There is a small layering effusion on the right with underlying opacity. There is a moderate to large effusion on the left which is a little larger in the interval with underlying opacity. Mild interstitial prominence. IMPRESSION: 1. There is a moderate to large pleural effusion on the left which is worse in the interval and a small pleural effusion on the right which was not seen previously. Opacities underlying the effusions may represent atelectasis but are nonspecific, particularly on the left. 2. Increasing interstitial opacities  suggestive of pulmonary venous congestion/mild edema. Electronically Signed   By: Gerome Sam III M.D.   On: 01/05/2021 13:37   ECHOCARDIOGRAM COMPLETE  Result Date: 01/05/2021    ECHOCARDIOGRAM REPORT   Patient Name:   Rebecca Short Date of Exam: 01/04/2021 Medical Rec #:  [id number]     Height:       62.0 in Accession #:    [id number]    Weight:       190.2 lb Date of Birth:  May 12, 1941     BSA:          1.871 m Patient Age:    80 years      BP:           112/63 mmHg Patient Gender: F             HR:           89 bpm. Exam Location:  Inpatient Procedure: 2D Echo, Cardiac Doppler and Color Doppler Indications:    I31.3 Pericardial effusion (noninflammatory)  History:        Patient has prior history of Echocardiogram examinations, most                 recent 12/24/2020. Arrythmias:Atrial Fibrillation.  Sonographer:    Eulah Pont RDCS Referring Phys: [id number] Cyndi Bender IMPRESSIONS  1. Left ventricular ejection fraction, by estimation, is 65 to 70%. The left ventricle has normal function. The left ventricle has no regional wall motion abnormalities. There is mild left ventricular hypertrophy. Left ventricular diastolic function could not be evaluated.  2. Right ventricular systolic function is normal. The right ventricular size is normal. There is normal pulmonary artery systolic pressure. The estimated right ventricular systolic pressure is 18.4 mmHg.  3. Effusion is mostly posterior to the LV - measuring up to 3.7 cm. Large pericardial effusion. The pericardial effusion is circumferential. There is no evidence of cardiac tamponade.  4. The mitral valve is grossly normal. No evidence of mitral valve regurgitation.  5. The aortic valve is tricuspid. Aortic valve regurgitation is not visualized.  6. The inferior vena cava is dilated in size with >50% respiratory variability, suggesting right atrial pressure of 8 mmHg. Comparison(s): Changes from prior study are noted. 12/24/2020: LVEF 50-55%, moderate pericardial  effusion up to 1 cm - LV posterior wall, no tamponade. FINDINGS  Left Ventricle: Left ventricular ejection fraction, by estimation, is 65 to 70%. The left ventricle has normal function. The left ventricle has no regional wall motion abnormalities. The left ventricular internal cavity size was normal in size. There is  mild left ventricular hypertrophy. Left ventricular diastolic function could not  be evaluated due to atrial fibrillation. Left ventricular diastolic function could not be evaluated. Right Ventricle: The right ventricular size is normal. No increase in right ventricular wall thickness. Right ventricular systolic function is normal. There is normal pulmonary artery systolic pressure. The tricuspid regurgitant velocity is 1.61 m/s, and  with an assumed right atrial pressure of 8 mmHg, the estimated right ventricular systolic pressure is 18.4 mmHg. Left Atrium: Left atrial size was normal in size. Right Atrium: Right atrial size was normal in size. Pericardium: Effusion is mostly posterior to the LV - measuring up to 3.7 cm. A large pericardial effusion is present. The pericardial effusion is circumferential. There is no evidence of cardiac tamponade. Mitral Valve: The mitral valve is grossly normal. No evidence of mitral valve regurgitation. Tricuspid Valve: The tricuspid valve is not well visualized. Tricuspid valve regurgitation is trivial. Aortic Valve: The aortic valve is tricuspid. Aortic valve regurgitation is not visualized. Pulmonic Valve: The pulmonic valve was grossly normal. Pulmonic valve regurgitation is not visualized. Aorta: The aortic root and ascending aorta are structurally normal, with no evidence of dilitation. Venous: The inferior vena cava is dilated in size with greater than 50% respiratory variability, suggesting right atrial pressure of 8 mmHg. IAS/Shunts: No atrial level shunt detected by color flow Doppler.  LEFT VENTRICLE PLAX 2D LVIDd:         3.90 cm LVIDs:         2.50 cm LV  PW:         1.10 cm LV IVS:        1.10 cm LVOT diam:     2.10 cm LV SV:         52 LV SV Index:   28 LVOT Area:     3.46 cm  RIGHT VENTRICLE RV S prime:     9.90 cm/s TAPSE (M-mode): 1.5 cm LEFT ATRIUM             Index       RIGHT ATRIUM           Index LA diam:        3.60 cm 1.92 cm/m  RA Area:     17.90 cm LA Vol (A2C):   42.0 ml 22.44 ml/m RA Volume:   39.60 ml  21.16 ml/m LA Vol (A4C):   42.4 ml 22.66 ml/m LA Biplane Vol: 43.4 ml 23.19 ml/m  AORTIC VALVE LVOT Vmax:   92.00 cm/s LVOT Vmean:  57.300 cm/s LVOT VTI:    0.151 m  AORTA Ao Root diam: 3.10 cm Ao Asc diam:  3.00 cm TRICUSPID VALVE TR Peak grad:   10.4 mmHg TR Vmax:        161.00 cm/s  SHUNTS Systemic VTI:  0.15 m Systemic Diam: 2.10 cm Zoila Shutter MD Electronically signed by Zoila Shutter MD Signature Date/Time: 01/05/2021/12:15:27 PM    Final     Cardiac Studies   Preliminary review of echo - there is a large circumferential pericardial effusion. There is no clear diastolic chamber collapse. With AFib, interpretation of respiratory flow variation is very challenging, but the mitral inflow appears to show very little variation. There are at most equivocal signs of tamponade.  Patient Profile     80 y.o. female with permanent atrial fibrillation, macular degeneration, OSA on CPAP, obesity, chronic back pain, moderate pericardial effusion, who is being seen 01/03/2021 for the evaluation of A. fib with RVR, worsening edema, exertional dyspnea and weight gain, suspected LLL pneumonia. A left pleural  effusion is also seen.  Assessment & Plan    Enlarging pericardial effusion with hypotension overnight - large left pleural effusion as well - given lasix, but issues with hypotension and SBP in the 60's, responded to fluid bolus- very preload dependent - this could suggest tamponade physiology - will put heparin on hold. I personally reviewed the bedside echo and was present during the procedure - d/w hospitalist attending at the bedside.  There is a large pericardial effusion, up to 2.7 cm, but it does not appear worse than previous - fibrinous strands noted in the pericardium. Large left pleural effusion as well. There is tricuspid, but not mitral respiratory variation and diastolic collapse of the RV, but the IVC is <2.1 cm and collapses - this again, does not suggest full tamponade physiology. I'm concerned that her pleural effusion is causing her the most symptoms at this point - as she is more hemodynamically stable, would proceed with IR thoracentesis - would limited fluid removal to 1.5L to avoid risk of hypotension. Still suspect she will need pericardiocentesis this week, however, if the thoracentesis fluid is diagnostic (i.e., suggests malignancy), then she may be better served with a pericardial window.  For questions or updates, please contact [facility] Please consult [email or web address] for contact info under   Chrystie Nose, MD, Milagros Loll  [place]  [facility]  Medical Director of the Advanced Lipid Disorders &  Cardiovascular Risk Reduction Clinic Diplomate of the American Board of Clinical Lipidology Attending Cardiologist  Direct Dial: [phone]  Fax: [phone]  Website:  [email or web address]  Chrystie Nose, MD  01/06/2021, 10:11 AM

## 2021-01-06 NOTE — Progress Notes (Signed)
ANTICOAGULATION CONSULT NOTE   Pharmacy Consult for Heparin Indication: atrial fibrillation  Allergies  Allergen Reactions   Azithromycin Other (See Comments)    Pain in arm with IV infusion    Condrolite [Glucosamine-Chondroitin-Msm] Rash    Patient Measurements: Height: 5\' 2"  (157.5 cm) Weight: 87 kg (191 lb 14.4 oz) IBW/kg (Calculated) : 50.1 Heparin Dosing Weight: 69 kg  Vital Signs: Temp: 97.7 F (36.5 C) (08/14 0753) Temp Source: Oral (08/14 0753) BP: 94/62 (08/14 0753) Pulse Rate: 82 (08/14 0753)  Labs: Recent Labs    01/03/21 0951 01/03/21 1338 01/03/21 1737 01/04/21 0228 01/04/21 1210 01/04/21 2141 01/05/21 0343 01/05/21 1806 01/06/21 0220  HGB 14.2  --   --  13.1  --   --  12.6  --  13.1  HCT 42.9  --   --  39.3  --   --  38.5  --  39.3  PLT 293  --   --  272  --   --  252  --  308  APTT  --   --    < > 44*   < > 79* 113* 65*  --   LABPROT 18.4*  --   --   --   --   --   --   --   --   INR 1.5*  --   --   --   --   --   --   --   --   HEPARINUNFRC  --   --    < > >1.10*   < >  --  0.81* 0.54 0.69  CREATININE 0.85  --   --  0.80  --   --  0.80  --   --   TROPONINIHS 7 5  --   --   --   --   --   --   --    < > = values in this interval not displayed.     Estimated Creatinine Clearance: 57.5 mL/min (by C-G formula based on SCr of 0.8 mg/dL).   Medical History: Past Medical History:  Diagnosis Date   A-fib [facility])    Allergy    Atrial fibrillation (HCC)    Macular degeneration    OSA (obstructive sleep apnea)    mild with total AHI 7.25/hr and severe during REM sleep at 35/hr now on CPAP at 6cm H2O    Assessment: [facility] is an 80 YO female who presented to the ED w/ SOB and afib. She was diaphoretic, had chest pain, and increased palpitations. She takes Eliquis 5 mg BID at home and LD was 1900 8/10. Apixaban is on hold in anticipation for the possible need for pericardiocentesis or surgical window. Plan for echo on Monday, 01/07/21.  Heparin level on  higher end of goal range at 0.69. Patient did have hypotensive event last night but per convo with RN, patient is doing better today and has no s/sx bleeding or infusion complications. Will continue at current rate and check HL in the morning.    Goal of Therapy:  Heparin level 0.3-0.7 units/ml Monitor platelets by anticoagulation protocol: Yes   Plan:  -Continue heparin at 1350 units/hr -Daily heparin level and CBC -Monitor s/sx bleeding  01/09/21, Pharm.D. PGY-1 Ambulatory Care Resident Phone:[phone] 01/06/2021 9:13 AM

## 2021-01-06 NOTE — Significant Event (Signed)
Rapid Response Event Note   Reason for Call : Hypotension in the setting of pericardial effusion/afib Initial Focused Assessment:  I was notified of pt with SBP 50-60s.  I instructed to initiate a NS 250cc bolus over the telephone. Upon my arrival, Ms. Ratti was arousable to loud voice, able to answer simple questions and stated "I don't feel good." Pt is currently on her home CPAP. Skin is pale, diaphoretic and cool. No CP or SOB. BP 52/41. Verified with a manual BP. HR Afib/aflutter 80-90. Dr. Margo Aye was notified and orders received for an additional 250 cc bolus and Midodrine.  Pt began to feel better after NS bolus completed.   2355-afebrile, HR 94 afib, 88/64 (72), RR 16 with sats 96% on 8L bled into CPAP.    Interventions:  -NS bolus 250cc x2 -Midodrine per Dr. Scharlene Gloss order     MD Notified: Dr Margo Aye per nursing staff  Call Time:2310 Arrival Time: 2315 End Time: 0015  Rose Fillers, RN

## 2021-01-06 NOTE — Progress Notes (Signed)
Patient has not voided since 1035hrs, denies feeling the need to void. Bladder scan done, 79ml noted in bladder. Will continue to monitor.

## 2021-01-06 NOTE — Progress Notes (Addendum)
PROGRESS NOTE    Rebecca Short  ZOX:[id number] DOB: 11/10/1940 DOA: 01/03/2021 PCP: Daisy Floro, MD   Chief Complaint  Patient presents with   Atrial Fibrillation    Brief Narrative: 80 year old female with history of permanent A. fib on Eliquis, morbid obesity BMI 34 with OSA on CPAP presents with cough, shortness of breath progressively worse since leaving the hospital but he was hospitalized 7/31-8/2 for A. fib with RVR, hypotension.  Work-up in the ED showed A. fib with RVR, pneumonia and was admitted for further work-up  Subjective:  Patient reports some dyspnea yesterday, she had an episode of hypotension overnight, she does report weakness, generalized as well, this episode resolved with IV fluid bolus.  Assessment & Plan:  Large and growing in size pericardial effusion with evidence of increasing right atrial pressure without tamponade:  -No evidence of lung mass on the CT, has had effusion since 7/31st.  -Agement per cardiology, stat echo today showing difficult cardial effusion, but no stigmata of tamponade, likely will need cardiocentesis, likely an elective basis, timing per cardiology. -On colchicine and NSAIDs. -He is on heparin GTT in case he needs pericardiocentesis.  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  Appreciate cardiology input on board.  Echo planned for Monday.Weight has gone up overnight -Lasix stopped 8/12 per cardiology-defer to cardiology for further Lasix.   Net IO Since Admission: 2,333.23 mL [01/06/21 1247]   Intake/Output Summary (Last 24 hours) at 01/06/2021 1247 Last data filed at 01/06/2021 1035 Gross per 24 hour  Intake 1920.79 ml  Output 755 ml  Net 1165.79 ml   Filed Weights   01/04/21 0432 01/05/21 0642 01/06/21 0602  Weight: 86.3 kg 86.6 kg 87 kg    Permanent atrial fibrillation with RVR, -  currently rate controlled on metoprolol 25 mg every  6 hours, continue digoxin level pending for Monday.  Anticoagulation w/ heparin  Hyponatremia: Monitor Hypokalemia: replete  OSA on CPAP: Continue same Morbid Obesity w/ BMI 34.9: Will benefit with weight loss, healthy lifestyle and PCP follow-up  Leukocytosis/Community acquired pneumonia: Presented with leukocytosis Pro-Cal 0.11.  Continue ceftriaxone, doxycycline, Mucinex, supportive measures antitussives.  Pleural effusion, left -We will request thoracentesis by IR. Recent Labs  Lab 01/03/21 0951 01/03/21 1737 01/03/21 2122 01/04/21 0228 01/05/21 0343 01/06/21 0220  WBC 12.6*  --   --  9.3 9.5 17.3*  LATICACIDVEN  --  1.3 1.2  --   --   --   PROCALCITON  --  0.11  --   --   --   --     Diet Order             Diet Heart Room service appropriate? Yes; Fluid consistency: Thin  Diet effective now                   Patient's Body mass index is 35.1 kg/m.  DVT prophylaxis: SCD.  Anticoagulation on hold for thoracentecis Code Status:   Code Status: Full Code  Family Communication: plan of care discussed with Husband at bedside. Status is: Inpatient Remains inpatient appropriate because:Ongoing diagnostic testing needed not appropriate for outpatient work up and Inpatient level of care appropriate due to severity of illness  Dispo: The patient is from: Home              Anticipated d/c is to: Home  Patient currently is not medically stable to d/c.   Difficult to place patient No Unresulted Labs (From admission, onward)     Start     Ordered   01/07/21 0500  Digoxin level  Once,   R        01/04/21 1554   01/06/21 0837  Basic metabolic panel  Daily,   R      01/06/21 0836   01/06/21 0837  Magnesium  Daily,   R      01/06/21 0836   01/05/21 0500  Heparin level (unfractionated)  Daily,   R      01/03/21 1659   01/05/21 0500  CBC  Daily,   R      01/04/21 1438           Medications reviewed:  Scheduled Meds:  chlorhexidine  15 mL Mouth Rinse BID    colchicine  0.6 mg Oral BID   digoxin  0.0625 mg Oral Daily   doxycycline  100 mg Oral Q12H   guaiFENesin  600 mg Oral BID   ibuprofen  800 mg Oral TID   lidocaine (PF)       metoprolol tartrate  25 mg Oral Q6H   pantoprazole  40 mg Oral BID   sodium chloride flush  3 mL Intravenous Q12H   Continuous Infusions:  cefTRIAXone (ROCEPHIN)  IV 1 g (01/06/21 1016)   heparin Stopped (01/06/21 1027)   Consultants: - cardiology  Procedures: -Thoracentesis 8/14.  Antimicrobials: Anti-infectives (From admission, onward)    Start     Dose/Rate Route Frequency Ordered Stop   01/04/21 1200  cefTRIAXone (ROCEPHIN) 1 g in sodium chloride 0.9 % 100 mL IVPB  Status:  Discontinued        1 g 200 mL/hr over 30 Minutes Intravenous  Once 01/03/21 1636 01/03/21 1713   01/04/21 1000  cefTRIAXone (ROCEPHIN) 1 g in sodium chloride 0.9 % 100 mL IVPB        1 g 200 mL/hr over 30 Minutes Intravenous Every 24 hours 01/03/21 1713     01/04/21 1000  doxycycline (VIBRA-TABS) tablet 100 mg        100 mg Oral Every 12 hours 01/04/21 0738     01/03/21 1215  cefTRIAXone (ROCEPHIN) 1 g in sodium chloride 0.9 % 100 mL IVPB        1 g 200 mL/hr over 30 Minutes Intravenous  Once 01/03/21 1209 01/03/21 1355   01/03/21 1215  azithromycin (ZITHROMAX) 500 mg in sodium chloride 0.9 % 250 mL IVPB        500 mg 250 mL/hr over 60 Minutes Intravenous  Once 01/03/21 1209 01/03/21 1502      Culture/Microbiology No results found for: SDES, SPECREQUEST, CULT, REPTSTATUS  Other culture-see note  Objective: Vitals: Today's Vitals   01/06/21 0602 01/06/21 0753 01/06/21 1042 01/06/21 1117  BP: (!) 108/57 94/62  106/72  Pulse:  82  99  Resp: 18 18  18   Temp: 98.2 F (36.8 C) 97.7 F (36.5 C)  97.7 F (36.5 C)  TempSrc: Oral Oral  Oral  SpO2: 96% 95%  97%  Weight: 87 kg     Height:      PainSc:   0-No pain     Intake/Output Summary (Last 24 hours) at 01/06/2021 1247 Last data filed at 01/06/2021 1035 Gross per  24 hour  Intake 1920.79 ml  Output 755 ml  Net 1165.79 ml   Filed Weights   01/04/21 0432 01/05/21 3244  01/06/21 0602  Weight: 86.3 kg 86.6 kg 87 kg   Weight change: 0.454 kg  Intake/Output from previous day: 08/13 0701 - 08/14 0700 In: 2380.8 [P.O.:1070; I.V.:853.5; IV Piggyback:457.3] Out: 580 [Urine:580] Intake/Output this shift: Total I/O In: 120 [P.O.:120] Out: 175 [Urine:175] Filed Weights   01/04/21 0432 01/05/21 0642 01/06/21 0602  Weight: 86.3 kg 86.6 kg 87 kg   Examination:  Awake Alert, Oriented X 3, No new F.N deficits, Normal affect Symmetrical Chest wall movement, diminished air entry in the left lung irregular irregular,No Gallops,Rubs or new Murmurs, No Parasternal Heave +ve B.Sounds, Abd Soft, No tenderness, No rebound - guarding or rigidity. No Cyanosis, Clubbing or edema, No new Rash or bruise    CBC: Recent Labs  Lab 01/03/21 0951 01/04/21 0228 01/05/21 0343 01/06/21 0220  WBC 12.6* 9.3 9.5 17.3*  NEUTROABS 11.1*  --   --   --   HGB 14.2 13.1 12.6 13.1  HCT 42.9 39.3 38.5 39.3  MCV 89.7 89.1 89.5 89.5  PLT 293 272 252 308   Basic Metabolic Panel: Recent Labs  Lab 01/03/21 0951 01/04/21 0228 01/05/21 0343 01/06/21 0220  NA 130* 131* 132* 134*  K 4.3 3.7 3.4* 4.0  CL 96* 97* 98 103  CO2 25 25 25 22   GLUCOSE 121* 108* 122* 136*  BUN 12 16 25* 24*  CREATININE 0.85 0.80 0.80 1.03*  CALCIUM 8.6* 8.2* 8.5* 8.2*  MG 2.1  --   --  2.0   GFR: Estimated Creatinine Clearance: 44.6 mL/min (A) (by C-G formula based on SCr of 1.03 mg/dL (H)). Liver Function Tests: No results for input(s): AST, ALT, ALKPHOS, BILITOT, PROT, ALBUMIN in the last 168 hours. No results for input(s): LIPASE, AMYLASE in the last 168 hours. No results for input(s): AMMONIA in the last 168 hours. Coagulation Profile: Recent Labs  Lab 01/03/21 0951  INR 1.5*   Cardiac Enzymes: No results for input(s): CKTOTAL, CKMB, CKMBINDEX, TROPONINI in the last 168 hours. BNP  (last 3 results) No results for input(s): PROBNP in the last 8760 hours. HbA1C: No results for input(s): HGBA1C in the last 72 hours. CBG: Recent Labs  Lab 01/05/21 2322  GLUCAP 158*   Lipid Profile: No results for input(s): CHOL, HDL, LDLCALC, TRIG, CHOLHDL, LDLDIRECT in the last 72 hours. Thyroid Function Tests: No results for input(s): TSH, T4TOTAL, FREET4, T3FREE, THYROIDAB in the last 72 hours. Anemia Panel: No results for input(s): VITAMINB12, FOLATE, FERRITIN, TIBC, IRON, RETICCTPCT in the last 72 hours. Sepsis Labs: Recent Labs  Lab 01/03/21 1737 01/03/21 2122  PROCALCITON 0.11  --   LATICACIDVEN 1.3 1.2    Recent Results (from the past 240 hour(s))  Resp Panel by RT-PCR (Flu A&B, Covid) Nasopharyngeal Swab     Status: None   Collection Time: 01/03/21 10:14 AM   Specimen: Nasopharyngeal Swab; Nasopharyngeal(NP) swabs in vial transport medium  Result Value Ref Range Status   SARS Coronavirus 2 by RT PCR NEGATIVE NEGATIVE Final    Comment: (NOTE) SARS-CoV-2 target nucleic acids are NOT DETECTED.  The SARS-CoV-2 RNA is generally detectable in upper respiratory specimens during the acute phase of infection. The lowest concentration of SARS-CoV-2 viral copies this assay can detect is 138 copies/mL. A negative result does not preclude SARS-Cov-2 infection and should not be used as the sole basis for treatment or other patient management decisions. A negative result may occur with  improper specimen collection/handling, submission of specimen other than nasopharyngeal swab, presence of viral mutation(s) within the  areas targeted by this assay, and inadequate number of viral copies(<138 copies/mL). A negative result must be combined with clinical observations, patient history, and epidemiological information. The expected result is Negative.  Fact Sheet for Patients:  BloggerCourse.com  Fact Sheet for Healthcare Providers:   SeriousBroker.it  This test is no t yet approved or cleared by the [place] FDA and  has been authorized for detection and/or diagnosis of SARS-CoV-2 by FDA under an Emergency Use Authorization (EUA). This EUA will remain  in effect (meaning this test can be used) for the duration of the COVID-19 declaration under Section 564(b)(1) of the Act, 21 U.S.C.section 360bbb-3(b)(1), unless the authorization is terminated  or revoked sooner.       Influenza A by PCR NEGATIVE NEGATIVE Final   Influenza B by PCR NEGATIVE NEGATIVE Final    Comment: (NOTE) The Xpert Xpress SARS-CoV-2/FLU/RSV plus assay is intended as an aid in the diagnosis of influenza from Nasopharyngeal swab specimens and should not be used as a sole basis for treatment. Nasal washings and aspirates are unacceptable for Xpert Xpress SARS-CoV-2/FLU/RSV testing.  Fact Sheet for Patients: BloggerCourse.com  Fact Sheet for Healthcare Providers: SeriousBroker.it  This test is not yet approved or cleared by the [place] FDA and has been authorized for detection and/or diagnosis of SARS-CoV-2 by FDA under an Emergency Use Authorization (EUA). This EUA will remain in effect (meaning this test can be used) for the duration of the COVID-19 declaration under Section 564(b)(1) of the Act, 21 U.S.C. section 360bbb-3(b)(1), unless the authorization is terminated or revoked.  Performed at [facility], [address] [place], [place] [address]      Radiology Studies: DG Chest Port 1 View  Result Date: 01/05/2021 CLINICAL DATA:  Shortness of breath.  Evaluate pleural effusion. EXAM: PORTABLE CHEST 1 VIEW COMPARISON:  January 03, 2021 FINDINGS: The cardiomediastinal silhouette is stable. No pneumothorax. There is a small layering effusion on the right with underlying opacity. There is a moderate to large effusion on the left which is a little  larger in the interval with underlying opacity. Mild interstitial prominence. IMPRESSION: 1. There is a moderate to large pleural effusion on the left which is worse in the interval and a small pleural effusion on the right which was not seen previously. Opacities underlying the effusions may represent atelectasis but are nonspecific, particularly on the left. 2. Increasing interstitial opacities suggestive of pulmonary venous congestion/mild edema. Electronically Signed   By: Gerome Sam III M.D.   On: 01/05/2021 13:37   ECHOCARDIOGRAM COMPLETE  Result Date: 01/05/2021    ECHOCARDIOGRAM REPORT   Patient Name:   Rebecca Short Date of Exam: 01/04/2021 Medical Rec #:  [id number]     Height:       62.0 in Accession #:    [id number]    Weight:       190.2 lb Date of Birth:  09/03/1940     BSA:          1.871 m Patient Age:    79 years      BP:           112/63 mmHg Patient Gender: F             HR:           89 bpm. Exam Location:  Inpatient Procedure: 2D Echo, Cardiac Doppler and Color Doppler Indications:    I31.3 Pericardial effusion (noninflammatory)  History:  Patient has prior history of Echocardiogram examinations, most                 recent 12/24/2020. Arrythmias:Atrial Fibrillation.  Sonographer:    Eulah Pont RDCS Referring Phys: [id number] Cyndi Bender IMPRESSIONS  1. Left ventricular ejection fraction, by estimation, is 65 to 70%. The left ventricle has normal function. The left ventricle has no regional wall motion abnormalities. There is mild left ventricular hypertrophy. Left ventricular diastolic function could not be evaluated.  2. Right ventricular systolic function is normal. The right ventricular size is normal. There is normal pulmonary artery systolic pressure. The estimated right ventricular systolic pressure is 18.4 mmHg.  3. Effusion is mostly posterior to the LV - measuring up to 3.7 cm. Large pericardial effusion. The pericardial effusion is circumferential. There is no evidence of  cardiac tamponade.  4. The mitral valve is grossly normal. No evidence of mitral valve regurgitation.  5. The aortic valve is tricuspid. Aortic valve regurgitation is not visualized.  6. The inferior vena cava is dilated in size with >50% respiratory variability, suggesting right atrial pressure of 8 mmHg. Comparison(s): Changes from prior study are noted. 12/24/2020: LVEF 50-55%, moderate pericardial effusion up to 1 cm - LV posterior wall, no tamponade. FINDINGS  Left Ventricle: Left ventricular ejection fraction, by estimation, is 65 to 70%. The left ventricle has normal function. The left ventricle has no regional wall motion abnormalities. The left ventricular internal cavity size was normal in size. There is  mild left ventricular hypertrophy. Left ventricular diastolic function could not be evaluated due to atrial fibrillation. Left ventricular diastolic function could not be evaluated. Right Ventricle: The right ventricular size is normal. No increase in right ventricular wall thickness. Right ventricular systolic function is normal. There is normal pulmonary artery systolic pressure. The tricuspid regurgitant velocity is 1.61 m/s, and  with an assumed right atrial pressure of 8 mmHg, the estimated right ventricular systolic pressure is 18.4 mmHg. Left Atrium: Left atrial size was normal in size. Right Atrium: Right atrial size was normal in size. Pericardium: Effusion is mostly posterior to the LV - measuring up to 3.7 cm. A large pericardial effusion is present. The pericardial effusion is circumferential. There is no evidence of cardiac tamponade. Mitral Valve: The mitral valve is grossly normal. No evidence of mitral valve regurgitation. Tricuspid Valve: The tricuspid valve is not well visualized. Tricuspid valve regurgitation is trivial. Aortic Valve: The aortic valve is tricuspid. Aortic valve regurgitation is not visualized. Pulmonic Valve: The pulmonic valve was grossly normal. Pulmonic valve  regurgitation is not visualized. Aorta: The aortic root and ascending aorta are structurally normal, with no evidence of dilitation. Venous: The inferior vena cava is dilated in size with greater than 50% respiratory variability, suggesting right atrial pressure of 8 mmHg. IAS/Shunts: No atrial level shunt detected by color flow Doppler.  LEFT VENTRICLE PLAX 2D LVIDd:         3.90 cm LVIDs:         2.50 cm LV PW:         1.10 cm LV IVS:        1.10 cm LVOT diam:     2.10 cm LV SV:         52 LV SV Index:   28 LVOT Area:     3.46 cm  RIGHT VENTRICLE RV S prime:     9.90 cm/s TAPSE (M-mode): 1.5 cm LEFT ATRIUM  Index       RIGHT ATRIUM           Index LA diam:        3.60 cm 1.92 cm/m  RA Area:     17.90 cm LA Vol (A2C):   42.0 ml 22.44 ml/m RA Volume:   39.60 ml  21.16 ml/m LA Vol (A4C):   42.4 ml 22.66 ml/m LA Biplane Vol: 43.4 ml 23.19 ml/m  AORTIC VALVE LVOT Vmax:   92.00 cm/s LVOT Vmean:  57.300 cm/s LVOT VTI:    0.151 m  AORTA Ao Root diam: 3.10 cm Ao Asc diam:  3.00 cm TRICUSPID VALVE TR Peak grad:   10.4 mmHg TR Vmax:        161.00 cm/s  SHUNTS Systemic VTI:  0.15 m Systemic Diam: 2.10 cm Zoila Shutter MD Electronically signed by Zoila Shutter MD Signature Date/Time: 01/05/2021/12:15:27 PM    Final    ECHOCARDIOGRAM LIMITED  Result Date: 01/06/2021    ECHOCARDIOGRAM LIMITED REPORT   Patient Name:   Rebecca Short Date of Exam: 01/06/2021 Medical Rec #:  [id number]     Height:       62.0 in Accession #:    [id number]    Weight:       191.9 lb Date of Birth:  1941-03-28     BSA:          1.878 m Patient Age:    79 years      BP:           94/62 mmHg Patient Gender: F             HR:           97 bpm. Exam Location:  Inpatient Procedure: 2D Echo STAT ECHO Indications:    pericardial effusion  History:        Patient has prior history of Echocardiogram examinations, most                 recent 01/04/2021. Arrythmias:Atrial Fibrillation; Risk                 Factors:Sleep Apnea.  Sonographer:     Delcie Roch RDCS Referring Phys: [phone] KENNETH C HILTY IMPRESSIONS  1. Left ventricular ejection fraction, by estimation, is 65 to 70%. The left ventricle has normal function. The left ventricle has no regional wall motion abnormalities.  2. Right ventricular systolic function is normal. The right ventricular size is mildly enlarged.  3. Large pericardial effusion. The pericardial effusion is circumferential. There is no evidence of cardiac tamponade.  4. The inferior vena cava is normal in size with <50% respiratory variability, suggesting right atrial pressure of 8 mmHg. Comparison(s): Changes from prior study are noted. 01/04/2021: LVEF 65-70%, large pericardial effusion up to 3.7 cm, IVC collapsed, no tamponade physiology. Compared to the prior study 2 days ago, the pericardial effusion is stable - the LV cavity is small and likely preload dependent -the IVC collapses - there are a few features such as diastolic collapse noted and TV inflow variation, however, I suspect this is more respiratory given her large pleural effusion. FINDINGS  Left Ventricle: Left ventricular ejection fraction, by estimation, is 65 to 70%. The left ventricle has normal function. The left ventricle has no regional wall motion abnormalities. Right Ventricle: The right ventricular size is mildly enlarged. Right ventricular systolic function is normal. Pericardium: A large pericardial effusion is present. The pericardial effusion is circumferential. The pericardial effusion appears to contain  focal strands and fibrous material. There is diastolic collapse of the right ventricular free wall and excessive respiratory variation in the tricuspid valve spectral Doppler velocities. There is no evidence of cardiac tamponade. Venous: The inferior vena cava is normal in size with less than 50% respiratory variability, suggesting right atrial pressure of 8 mmHg. Zoila Shutter MD Electronically signed by Zoila Shutter MD Signature Date/Time:  01/06/2021/11:22:08 AM    Final      LOS: 3 days   Huey Bienenstock, MD [facility]  01/06/2021, 12:47 PM

## 2021-01-06 NOTE — Progress Notes (Signed)
  Echocardiogram 2D Echocardiogram has been performed.  Delcie Roch 01/06/2021, 11:14 AM

## 2021-01-06 NOTE — Progress Notes (Signed)
Placed on home CPAP, nasal pillows with 2L O bleed in.

## 2021-01-06 NOTE — Progress Notes (Signed)
   01/05/21 2315  Assess: MEWS Score  Temp 97.8 F (36.6 C)  BP (!) 63/31  Pulse Rate 61  ECG Heart Rate 76  Resp 20  Level of Consciousness Responds to Voice  SpO2 92 %  O2 Device CPAP  Assess: MEWS Score  MEWS Temp 0  MEWS Systolic 3  MEWS Pulse 0  MEWS RR 0  MEWS LOC 1  MEWS Score 4  MEWS Score Color Red  Assess: if the MEWS score is Yellow or Red  Were vital signs taken at a resting state? Yes  Focused Assessment Change from prior assessment (see assessment flowsheet)  Early Detection of Sepsis Score *See Row Information* Low  MEWS guidelines implemented *See Row Information* Yes  Treat  MEWS Interventions Escalated (See documentation below);Consulted Respiratory Therapy;Administered prn meds/treatments  Pain Scale 0-10  Pain Score 0  Take Vital Signs  Increase Vital Sign Frequency  Red: Q 1hr X 4 then Q 4hr X 4, if remains red, continue Q 4hrs  Escalate  MEWS: Escalate Red: discuss with charge nurse/RN and provider, consider discussing with RRT  Notify: Charge Nurse/RN  Name of Charge Nurse/RN Notified Dana, RN  Date Charge Nurse/RN Notified 01/06/21  Time Charge Nurse/RN Notified 0010  Notify: Provider  Provider Name/Title Raphael Gibney, MD  Date Provider Notified 01/05/21  Time Provider Notified 2318  Notification Type Page  Notification Reason Change in status  Provider response Evaluate remotely;See new orders  Date of Provider Response 01/05/21  Time of Provider Response 2320  Notify: Rapid Response  Name of Rapid Response RN Notified Norva Riffle, RN  Date Rapid Response Notified 01/05/21  Time Rapid Response Notified 2310  Document  Patient Outcome Stabilized after interventions  Progress note created (see row info) Yes

## 2021-01-07 ENCOUNTER — Inpatient Hospital Stay (HOSPITAL_COMMUNITY): Payer: Medicare Other

## 2021-01-07 ENCOUNTER — Encounter (HOSPITAL_COMMUNITY): Payer: Self-pay

## 2021-01-07 ENCOUNTER — Encounter (HOSPITAL_COMMUNITY)
Admission: EM | Disposition: A | Discharge status: Home Health | Payer: Self-pay | Source: Home / Self Care | Attending: Internal Medicine

## 2021-01-07 DIAGNOSIS — I4891 Unspecified atrial fibrillation: Secondary | ICD-10-CM | POA: Diagnosis not present

## 2021-01-07 DIAGNOSIS — Z7901 Long term (current) use of anticoagulants: Secondary | ICD-10-CM | POA: Diagnosis not present

## 2021-01-07 DIAGNOSIS — I313 Pericardial effusion (noninflammatory): Secondary | ICD-10-CM | POA: Diagnosis not present

## 2021-01-07 DIAGNOSIS — Z9889 Other specified postprocedural states: Secondary | ICD-10-CM | POA: Diagnosis not present

## 2021-01-07 DIAGNOSIS — J189 Pneumonia, unspecified organism: Secondary | ICD-10-CM | POA: Diagnosis not present

## 2021-01-07 HISTORY — PX: PERICARDIOCENTESIS: CATH118255

## 2021-01-07 LAB — CBC
HCT: 38.1 % (ref 36.0–46.0)
Hemoglobin: 12.8 g/dL (ref 12.0–15.0)
MCH: 29.8 pg (ref 26.0–34.0)
MCHC: 33.6 g/dL (ref 30.0–36.0)
MCV: 88.6 fL (ref 80.0–100.0)
Platelets: 319 10*3/uL (ref 150–400)
RBC: 4.3 MIL/uL (ref 3.87–5.11)
RDW: 14 % (ref 11.5–15.5)
WBC: 10.5 10*3/uL (ref 4.0–10.5)
nRBC: 0 % (ref 0.0–0.2)

## 2021-01-07 LAB — BODY FLUID CELL COUNT WITH DIFFERENTIAL
Eos, Fluid: 0 %
Lymphs, Fluid: 57 %
Monocyte-Macrophage-Serous Fluid: 7 % — ABNORMAL LOW (ref 50–90)
Neutrophil Count, Fluid: 36 % — ABNORMAL HIGH (ref 0–25)
Total Nucleated Cell Count, Fluid: 4550 cu mm — ABNORMAL HIGH (ref 0–1000)

## 2021-01-07 LAB — BASIC METABOLIC PANEL
Anion gap: 8 (ref 5–15)
BUN: 32 mg/dL — ABNORMAL HIGH (ref 8–23)
CO2: 22 mmol/L (ref 22–32)
Calcium: 8.1 mg/dL — ABNORMAL LOW (ref 8.9–10.3)
Chloride: 106 mmol/L (ref 98–111)
Creatinine, Ser: 1.38 mg/dL — ABNORMAL HIGH (ref 0.44–1.00)
GFR, Estimated: 39 mL/min — ABNORMAL LOW (ref >60)
Glucose, Bld: 110 mg/dL — ABNORMAL HIGH (ref 70–99)
Potassium: 4 mmol/L (ref 3.5–5.1)
Sodium: 136 mmol/L (ref 135–145)

## 2021-01-07 LAB — MRSA NEXT GEN BY PCR, NASAL: MRSA by PCR Next Gen: NOT DETECTED

## 2021-01-07 LAB — ECHOCARDIOGRAM LIMITED
Height: 62 in
Weight: 3049.6 oz

## 2021-01-07 LAB — MAGNESIUM: Magnesium: 1.9 mg/dL (ref 1.7–2.4)

## 2021-01-07 LAB — GLUCOSE, CAPILLARY: Glucose-Capillary: 136 mg/dL — ABNORMAL HIGH (ref 70–99)

## 2021-01-07 LAB — HEPARIN LEVEL (UNFRACTIONATED): Heparin Unfractionated: 0.36 IU/mL (ref 0.30–0.70)

## 2021-01-07 LAB — DIGOXIN LEVEL: Digoxin Level: 1.2 ng/mL (ref 0.8–2.0)

## 2021-01-07 SURGERY — PERICARDIOCENTESIS
Anesthesia: LOCAL

## 2021-01-07 MED ORDER — HEPARIN (PORCINE) IN NACL 1000-0.9 UT/500ML-% IV SOLN
INTRAVENOUS | Status: DC | PRN
  Administered 2021-01-07: 500 mL

## 2021-01-07 MED ORDER — LIDOCAINE HCL (PF) 1 % IJ SOLN
INTRAMUSCULAR | Status: DC | PRN
  Administered 2021-01-07: 5 mL

## 2021-01-07 MED ORDER — CHLORHEXIDINE GLUCONATE CLOTH 2 % EX PADS
6.0000 | MEDICATED_PAD | Freq: Every day | CUTANEOUS | Status: DC
  Administered 2021-01-08 – 2021-01-10 (×2): 6 via TOPICAL

## 2021-01-07 MED ORDER — LIDOCAINE HCL (PF) 1 % IJ SOLN
INTRAMUSCULAR | Status: AC
  Filled 2021-01-07: qty 30

## 2021-01-07 SURGICAL SUPPLY — 3 items
KIT MICROPUNCTURE NIT STIFF (SHEATH) ×2 IMPLANT
PACK CARDIAC CATHETERIZATION (CUSTOM PROCEDURE TRAY) ×2 IMPLANT
TRAY PERICARDIOCENTESIS 6FX60 (TRAY / TRAY PROCEDURE) ×2 IMPLANT

## 2021-01-07 NOTE — Interval H&P Note (Signed)
History and Physical Interval Note:  01/07/2021 3:45 PM  Rebecca Short  has presented today for surgery, with the diagnosis of large pericardial effusion.  The various methods of treatment have been discussed with the patient and family. After consideration of risks, benefits and other options for treatment, the patient has consented to  Procedure(s): PERICARDIOCENTESIS (N/A) as a surgical intervention.  The patient's history has been reviewed, patient examined, no change in status, stable for surgery.  I have reviewed the patient's chart and labs.  Questions were answered to the patient's satisfaction.     Necia Kamm

## 2021-01-07 NOTE — Progress Notes (Signed)
ANTICOAGULATION CONSULT NOTE   Pharmacy Consult for Heparin Indication: atrial fibrillation  Allergies  Allergen Reactions   Azithromycin Other (See Comments)    Pain in arm with IV infusion    Condrolite [Glucosamine-Chondroitin-Msm] Rash    Patient Measurements: Height: 5\' 2"  (157.5 cm) Weight: 86.5 kg (190 lb 9.6 oz) IBW/kg (Calculated) : 50.1 Heparin Dosing Weight: 69 kg  Vital Signs: Temp: 98.1 F (36.7 C) (08/15 0756) Temp Source: Oral (08/15 0756) BP: 118/65 (08/15 0756) Pulse Rate: 104 (08/15 0756)  Labs: Recent Labs    01/04/21 1210 01/04/21 2141 01/05/21 0343 01/05/21 1806 01/06/21 0220 01/07/21 0158  HGB   < >  --  12.6  --  13.1 12.8  HCT  --   --  38.5  --  39.3 38.1  PLT  --   --  252  --  308 319  APTT  --  79* 113* 65*  --   --   HEPARINUNFRC  --   --  0.81* 0.54 0.69 0.36  CREATININE  --   --  0.80  --  1.03* 1.38*   < > = values in this interval not displayed.     Estimated Creatinine Clearance: 33.2 mL/min (A) (by C-G formula based on SCr of 1.38 mg/dL (H)).   Medical History: Past Medical History:  Diagnosis Date   A-fib [facility])    Allergy    Atrial fibrillation (HCC)    Macular degeneration    OSA (obstructive sleep apnea)    mild with total AHI 7.25/hr and severe during REM sleep at 35/hr now on CPAP at 6cm H2O    Assessment: [facility] is an 80 YO female who presented to the ED w/ SOB and afib. She was diaphoretic, had chest pain, and increased palpitations. She takes Eliquis 5 mg BID at home and LD was 1900 8/10. Apixaban is on hold in anticipation for the possible need for pericardiocentesis or surgical window. Plan for echo on Monday, 01/07/21.  Heparin level 0.6 at goal on heparin drip 1350 uts/hr.  No s/sx bleeding or infusion complications. CBC stable     Goal of Therapy:  Heparin level 0.3-0.7 units/ml Monitor platelets by anticoagulation protocol: Yes   Plan:  -Continue heparin at 1350 units/hr -Daily heparin level and  CBC -Monitor s/sx bleeding   01/09/21 Pharm.D. CPP, BCPS Clinical Pharmacist [phone] 01/07/2021 9:41 AM

## 2021-01-07 NOTE — Progress Notes (Signed)
Pt c/o 3/10 pain along the left side of chest under her breast. Pain described as pressure, stated that it felt similar to having on clothing that was too tight. Pt also beginning to have an increase in shortness of breath. PO tylenol & albuterol neb given. All doses of metoprolol held this shift. Day shift RN updated

## 2021-01-07 NOTE — H&P (View-Only) (Signed)
Progress Note  Patient Name: Rebecca Short Date of Encounter: 01/07/2021  [facility] Cardiologist: [name], MD   Subjective   She c/o of left chest pain overnight under her left breast wrapping around to the lateral ribs. She is still on O2 and must remain sitting up.   Inpatient Medications    Scheduled Meds:  chlorhexidine  15 mL Mouth Rinse BID   colchicine  0.6 mg Oral BID   digoxin  0.0625 mg Oral Daily   doxycycline  100 mg Oral Q12H   guaiFENesin  600 mg Oral BID   ibuprofen  800 mg Oral TID   metoprolol tartrate  25 mg Oral Q6H   pantoprazole  40 mg Oral BID   sodium chloride flush  3 mL Intravenous Q12H   Continuous Infusions:  cefTRIAXone (ROCEPHIN)  IV Stopped (01/06/21 1120)   heparin 1,350 Units/hr (01/07/21 0518)   PRN Meds: acetaminophen **OR** acetaminophen, albuterol, guaiFENesin-dextromethorphan, HYDROcodone-acetaminophen, ondansetron **OR** ondansetron (ZOFRAN) IV, polyvinyl alcohol   Vital Signs    Vitals:   01/06/21 2133 01/07/21 0043 01/07/21 0435 01/07/21 0756  BP:  107/69 (!) 139/57 118/65  Pulse: 95 94 96 (!) 104  Resp:  14 16 17   Temp:  98 F (36.7 C) (!) 97.5 F (36.4 C) 98.1 F (36.7 C)  TempSrc:  Oral Oral Oral  SpO2:  94% 95% 96%  Weight:   86.5 kg   Height:        Intake/Output Summary (Last 24 hours) at 01/07/2021 0853 Last data filed at 01/07/2021 0518 Gross per 24 hour  Intake 841.18 ml  Output 625 ml  Net 216.18 ml   Last 3 Weights 01/07/2021 01/06/2021 01/05/2021  Weight (lbs) 190 lb 9.6 oz 191 lb 14.4 oz 190 lb 14.4 oz  Weight (kg) 86.456 kg 87.045 kg 86.592 kg      Telemetry    Atrial fibrillation with ventricular rate 100-120s - Personally Reviewed  ECG    No new tracings - Personally Reviewed  Physical Exam   GEN: No acute distress.   Neck: no JVD Cardiac: irregular rhythm, tachycardic rate, +rub Respiratory: breath sounds in bases bilaterally, respirations unlabored GI: Soft, nontender,  non-distended  MS: mild B LE edema; No deformity. Neuro:  Nonfocal  Psych: Normal affect   Labs    High Sensitivity Troponin:   Recent Labs  Lab 12/23/20 1334 12/23/20 1513 01/03/21 0951 01/03/21 1338  TROPONINIHS 2 2 7 5       Chemistry Recent Labs  Lab 01/05/21 0343 01/06/21 0220 01/06/21 1359 01/07/21 0158  NA 132* 134*  --  136  K 3.4* 4.0  --  4.0  CL 98 103  --  106  CO2 25 22  --  22  GLUCOSE 122* 136*  --  110*  BUN 25* 24*  --  32*  CREATININE 0.80 1.03*  --  1.38*  CALCIUM 8.5* 8.2*  --  8.1*  PROT  --   --  5.2*  --   GFRNONAA >60 55*  --  39*  ANIONGAP 9 9  --  8     Hematology Recent Labs  Lab 01/05/21 0343 01/06/21 0220 01/07/21 0158  WBC 9.5 17.3* 10.5  RBC 4.30 4.39 4.30  HGB 12.6 13.1 12.8  HCT 38.5 39.3 38.1  MCV 89.5 89.5 88.6  MCH 29.3 29.8 29.8  MCHC 32.7 33.3 33.6  RDW 13.7 13.7 14.0  PLT 252 308 319    BNP Recent Labs  Lab  01/03/21 0951  BNP 108.1*     DDimer No results for input(s): DDIMER in the last 168 hours.   Radiology    DG Chest 1 View  Result Date: 01/06/2021 CLINICAL DATA:  Status post thoracentesis on the left. EXAM: CHEST  1 VIEW COMPARISON:  January 05, 2021 FINDINGS: The patient is status post thoracentesis on the left. A left effusion with underlying opacity remains but is smaller. No pneumothorax. A small right pleural effusion with underlying atelectasis remains. No other changes. IMPRESSION: No pneumothorax after left thoracentesis. Bilateral pleural effusions remain with underlying atelectasis. Electronically Signed   By: Gerome Sam III M.D.   On: 01/06/2021 13:26   ECHOCARDIOGRAM LIMITED  Result Date: 01/06/2021    ECHOCARDIOGRAM LIMITED REPORT   Patient Name:   Rebecca Short Date of Exam: 01/06/2021 Medical Rec #:  [id number]     Height:       62.0 in Accession #:    [id number]    Weight:       191.9 lb Date of Birth:  [birth year]     BSA:          1.878 m Patient Age:    80 years      BP:            94/62 mmHg Patient Gender: F             HR:           97 bpm. Exam Location:  Inpatient Procedure: 2D Echo STAT ECHO Indications:    pericardial effusion  History:        Patient has prior history of Echocardiogram examinations, most                 recent 01/04/2021. Arrythmias:Atrial Fibrillation; Risk                 Factors:Sleep Apnea.  Sonographer:    Delcie Roch RDCS Referring Phys: [phone] KENNETH C HILTY IMPRESSIONS  1. Left ventricular ejection fraction, by estimation, is 65 to 70%. The left ventricle has normal function. The left ventricle has no regional wall motion abnormalities.  2. Right ventricular systolic function is normal. The right ventricular size is mildly enlarged.  3. Large pericardial effusion. The pericardial effusion is circumferential. There is no evidence of cardiac tamponade.  4. The inferior vena cava is normal in size with <50% respiratory variability, suggesting right atrial pressure of 8 mmHg. Comparison(s): Changes from prior study are noted. 01/04/2021: LVEF 65-70%, large pericardial effusion up to 3.7 cm, IVC collapsed, no tamponade physiology. Compared to the prior study 2 days ago, the pericardial effusion is stable - the LV cavity is small and likely preload dependent -the IVC collapses - there are a few features such as diastolic collapse noted and TV inflow variation, however, I suspect this is more respiratory given her large pleural effusion. FINDINGS  Left Ventricle: Left ventricular ejection fraction, by estimation, is 65 to 70%. The left ventricle has normal function. The left ventricle has no regional wall motion abnormalities. Right Ventricle: The right ventricular size is mildly enlarged. Right ventricular systolic function is normal. Pericardium: A large pericardial effusion is present. The pericardial effusion is circumferential. The pericardial effusion appears to contain focal strands and fibrous material. There is diastolic collapse of the right ventricular  free wall and excessive respiratory variation in the tricuspid valve spectral Doppler velocities. There is no evidence of cardiac tamponade. Venous: The inferior vena cava is normal in size with  less than 50% respiratory variability, suggesting right atrial pressure of 8 mmHg. Zoila Shutter MD Electronically signed by Zoila Shutter MD Signature Date/Time: 01/06/2021/11:22:08 AM    Final    US THORACENTESIS ASP PLEURAL SPACE W/IMG GUIDE  Result Date: 01/06/2021 INDICATION: Patient with a history of permanent atrial fibrillation admitted for AFib with RVR and found to have bilateral pleural effusions as well as a pericardial effusion. Interventional radiology asked to perform a diagnostic and therapeutic thoracentesis. EXAM: ULTRASOUND GUIDED THORACENTESIS MEDICATIONS: 1% lidocaine 10 mL COMPLICATIONS: None immediate. PROCEDURE: An ultrasound guided thoracentesis was thoroughly discussed with the patient and questions answered. The benefits, risks, alternatives and complications were also discussed. The patient understands and wishes to proceed with the procedure. Written consent was obtained. Ultrasound was performed to localize and mark an adequate pocket of fluid in the left chest. The area was then prepped and draped in the normal sterile fashion. 1% Lidocaine was used for local anesthesia. Under ultrasound guidance a 6 Fr Safe-T-Centesis catheter was introduced. Thoracentesis was performed. The catheter was removed and a dressing applied. FINDINGS: A total of approximately 950 mL of red/amber fluid was removed. Samples were sent to the laboratory as requested by the clinical team. IMPRESSION: Successful ultrasound guided left thoracentesis yielding 950 mL of pleural fluid. Read by: Alwyn Ren, NP Electronically Signed   By: Gilmer Mor D.O.   On: 01/06/2021 13:39    Cardiac Studies   Echo limited 01/06/21: 1. Left ventricular ejection fraction, by estimation, is 65 to 70%. The  left ventricle has  normal function. The left ventricle has no regional  wall motion abnormalities.   2. Right ventricular systolic function is normal. The right ventricular  size is mildly enlarged.   3. Large pericardial effusion. The pericardial effusion is  circumferential. There is no evidence of cardiac tamponade.   4. The inferior vena cava is normal in size with <50% respiratory  variability, suggesting right atrial pressure of 8 mmHg.   Patient Profile     80 y.o. female with permanent atrial fibrillation, macular degeneration, OSA on CPAP, obesity, chronic back pain, moderate pericardial effusion, who is being seen 01/03/2021 for the evaluation of A. fib with RVR, worsening edema, exertional dyspnea and weight gain, suspected LLL pneumonia. A left pleural effusion is also seen.  Assessment & Plan    Pericardial effusion - large pericardial effusion, no tamponade physiology seen on limited bedside echo - pt complains of left sided chest pain and shortness of breath, unable lie flat - HR in the 100-120s, permanent Afib - BP stable - continue colchicine + ibuprofen + protonix - will need pericardiocentesis vs window, depending on fluid studies   Pleural effusion Suspected LLL PNA - left thoracentesis yesterday with 950 cc fluid removed - low protein, LDH WNL, and NGTD - breath sounds in lower lobes today - still requiring O2 - will complete day 5 of rocephin and doxycycline today   Permanent atrial fibrillation Chronic anticoagulation - moderately rate controlled in the 100-120s on digoxin and metoprolol q6hr - she seems to be tolerating this for now, suspect rate is due to pericardia effusion - heparin gtt running for possible procedure today - last dose of eliquis 01/02/21 - digoxin dose will be held today for digoxin level 1.2   AKI - sCr 1.38 (normal on admission) - continue to monitor this      For questions or updates, please contact [facility] Please consult [email or web address]  for contact info under  Signed, Roe Rutherford Duke, PA  01/07/2021, 8:53 AM    I have examined the patient and reviewed assessment and plan and discussed with patient.  Agree with above as stated.    Patient with pneumonia who later had large pleural effusion and large pericardial effusion.  Pleural effusion was drained yesterday and her breathing is improved.  Heart rate is still elevated.  I think her large pericardial effusion is likely playing a part in this.  There was some thought to delay draining her pericardial space until cytology studies are back.  The thought was if it was malignant, perhaps pericardial window should be considered.  Since this could take several days, I think she will need to undergo pericardiocentesis.  Blood pressure has improved today.  Yesterday, rate control was difficult due to hypotension.  Hopefully with pericardiocentesis, her heart rate will decrease.  She is currently on IV heparin.  She has not had oral anticoagulation since Wednesday of last week.  Heparin can be stopped an hour prior to the procedure.  The patient is willing to proceed.  [name]

## 2021-01-07 NOTE — Progress Notes (Addendum)
Pericardial drain noted to mid,  lower chest area, 6Fr pigtail drain noted, sanguinous drainage noted in tubing, gauze with transparent dressing noted around insertion site, safety maintained

## 2021-01-07 NOTE — Progress Notes (Signed)
PROGRESS NOTE    Rebecca Short  VZS:[id number] DOB: 03-28-1941 DOA: 01/03/2021 PCP: Daisy Floro, MD   Chief Complaint  Patient presents with   Atrial Fibrillation    Brief Narrative:  80 year old female with history of permanent A. fib on Eliquis, morbid obesity BMI 34 with OSA on CPAP presents with cough, shortness of breath progressively worse since leaving the hospital but he was hospitalized 7/31-8/2 for A. fib with RVR, hypotension.  Work-up in the ED showed A. fib with RVR, pneumonia and was admitted for further work-up  Subjective:  Still reports some dyspnea, she denies any chest pain, reports generalized weakness and fatigue.     Assessment & Plan:  Large and growing in size pericardial effusion with evidence of increasing right atrial pressure without tamponade:  -No evidence of lung mass on the CT, has had effusion since 7/31st.  -On colchicine and NSAIDs. -she is on heparin GTT in case he needs pericardiocentesis. -Management per cardiology, no tamponade physiology, but significant for large pericardial effusion, cardiocentesis per cardiology when appropriate.  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  Appreciate cardiology input . -No Lasix for now given it does contribute to low blood pressure and decrease preload hypotension . Net IO Since Admission: 2,624.41 mL [01/07/21 1206]   Intake/Output Summary (Last 24 hours) at 01/07/2021 1206 Last data filed at 01/07/2021 1100 Gross per 24 hour  Intake 841.18 ml  Output 550 ml  Net 291.18 ml   Filed Weights   01/05/21 0642 01/06/21 0602 01/07/21 0435  Weight: 86.6 kg 87 kg 86.5 kg    Permanent atrial fibrillation with RVR, -  currently rate controlled on metoprolol 25 mg every 6 hours. - digoxin on hold today given level 1.2. -   Anticoagulation w/ heparin  Hyponatremia: Monitor Hypokalemia: replete  OSA on CPAP:  Continue same Morbid Obesity w/ BMI 34.9: Will benefit with weight loss, healthy lifestyle and PCP follow-up  Leukocytosis/Community acquired pneumonia / left pleural effusion Presented with leukocytosis Pro-Cal 0.11.  Received total 5 days of antibiotics. -Significant left pleural effusion status postthoracentesis of 950 cc on 8/14.  Exudative given LDL ratio is >0.6, so it is most likely parapneumonic versus inflammatory process(may be same process contributing to her pericardial effusion)  Pleural effusion, left -We will request thoracentesis by IR. Recent Labs  Lab 01/03/21 0951 01/03/21 1737 01/03/21 2122 01/04/21 0228 01/05/21 0343 01/06/21 0220 01/07/21 0158  WBC 12.6*  --   --  9.3 9.5 17.3* 10.5  LATICACIDVEN  --  1.3 1.2  --   --   --   --   PROCALCITON  --  0.11  --   --   --   --   --     Diet Order             Diet NPO time specified  Diet effective now                   Patient's Body mass index is 34.86 kg/m.  DVT prophylaxis: SCD.  Anticoagulation on hold for thoracentecis Code Status:   Code Status: Full Code  Family Communication: discussed with patient, none at bedside. Status is: Inpatient Remains inpatient appropriate because:Ongoing diagnostic testing needed not appropriate for outpatient work up and Inpatient level of care appropriate due to severity of illness  Dispo: The patient is from: Home  Anticipated d/c is to: Home              Patient currently is not medically stable to d/c.   Difficult to place patient No Unresulted Labs (From admission, onward)     Start     Ordered   01/06/21 0837  Basic metabolic panel  Daily,   R      01/06/21 0836   01/06/21 0837  Magnesium  Daily,   R      01/06/21 0836   01/05/21 0500  Heparin level (unfractionated)  Daily,   R      01/03/21 1659   01/05/21 0500  CBC  Daily,   R      01/04/21 1438           Medications reviewed:  Scheduled Meds:  chlorhexidine  15 mL Mouth Rinse BID    colchicine  0.6 mg Oral BID   doxycycline  100 mg Oral Q12H   guaiFENesin  600 mg Oral BID   ibuprofen  800 mg Oral TID   metoprolol tartrate  25 mg Oral Q6H   pantoprazole  40 mg Oral BID   sodium chloride flush  3 mL Intravenous Q12H   Continuous Infusions:  cefTRIAXone (ROCEPHIN)  IV 1 g (01/07/21 0943)   heparin 1,350 Units/hr (01/07/21 0518)   Consultants: - cardiology  Procedures: -Thoracentesis 8/14.  Antimicrobials: Anti-infectives (From admission, onward)    Start     Dose/Rate Route Frequency Ordered Stop   01/04/21 1200  cefTRIAXone (ROCEPHIN) 1 g in sodium chloride 0.9 % 100 mL IVPB  Status:  Discontinued        1 g 200 mL/hr over 30 Minutes Intravenous  Once 01/03/21 1636 01/03/21 1713   01/04/21 1000  cefTRIAXone (ROCEPHIN) 1 g in sodium chloride 0.9 % 100 mL IVPB        1 g 200 mL/hr over 30 Minutes Intravenous Every 24 hours 01/03/21 1713     01/04/21 1000  doxycycline (VIBRA-TABS) tablet 100 mg        100 mg Oral Every 12 hours 01/04/21 0738     01/03/21 1215  cefTRIAXone (ROCEPHIN) 1 g in sodium chloride 0.9 % 100 mL IVPB        1 g 200 mL/hr over 30 Minutes Intravenous  Once 01/03/21 1209 01/03/21 1355   01/03/21 1215  azithromycin (ZITHROMAX) 500 mg in sodium chloride 0.9 % 250 mL IVPB        500 mg 250 mL/hr over 60 Minutes Intravenous  Once 01/03/21 1209 01/03/21 1502      Culture/Microbiology    Component Value Date/Time   SDES PLEURAL FLUID 01/06/2021 1307   SPECREQUEST LUNG RIGHT 01/06/2021 1307   CULT  01/06/2021 1307    NO GROWTH < 24 HOURS Performed at [facility], [address], [place], [place] [address]    REPTSTATUS PENDING 01/06/2021 1307    Other culture-see note  Objective: Vitals: Today's Vitals   01/07/21 0757 01/07/21 0805 01/07/21 1141 01/07/21 1143  BP: 118/65  117/65 117/65  Pulse: 100  99 100  Resp: Temp: 98.1 F (36.7 C)  97.8 F (36.6 C) 97.8 F (36.6 C)  TempSrc:   Oral   SpO2:   97%    Weight:      Height:      PainSc:  0-No pain      Intake/Output Summary (Last 24 hours) at 01/07/2021 1206 Last data filed at  01/07/2021 1100 Gross per 24 hour  Intake 841.18 ml  Output 550 ml  Net 291.18 ml   Filed Weights   01/05/21 0642 01/06/21 0602 01/07/21 0435  Weight: 86.6 kg 87 kg 86.5 kg   Weight change: -0.59 kg  Intake/Output from previous day: 08/14 0701 - 08/15 0700 In: 961.2 [P.O.:700; I.V.:261.2] Out: 625 [Urine:625] Intake/Output this shift: Total I/O In: -  Out: 100 [Urine:100] Filed Weights   01/05/21 0642 01/06/21 0602 01/07/21 0435  Weight: 86.6 kg 87 kg 86.5 kg   Examination:  Awake Alert, Oriented X 3, No new F.N deficits, Normal affect Symmetrical Chest wall movement, improved air entry at lung bases bilaterally RRR,No Gallops,Rubs or new Murmurs, No Parasternal Heave +ve B.Sounds, Abd Soft, No tenderness, No rebound - guarding or rigidity. No Cyanosis, Clubbing or edema, No new Rash or bruise     CBC: Recent Labs  Lab 01/03/21 0951 01/04/21 0228 01/05/21 0343 01/06/21 0220 01/07/21 0158  WBC 12.6* 9.3 9.5 17.3* 10.5  NEUTROABS 11.1*  --   --   --   --   HGB 14.2 13.1 12.6 13.1 12.8  HCT 42.9 39.3 38.5 39.3 38.1  MCV 89.7 89.1 89.5 89.5 88.6  PLT 293 272 252 308 319   Basic Metabolic Panel: Recent Labs  Lab 01/03/21 0951 01/04/21 0228 01/05/21 0343 01/06/21 0220 01/07/21 0158  NA 130* 131* 132* 134* 136  K 4.3 3.7 3.4* 4.0 4.0  CL 96* 97* 98 103 106  CO2 25 25 25 22 22   GLUCOSE 121* 108* 122* 136* 110*  BUN 12 16 25* 24* 32*  CREATININE 0.85 0.80 0.80 1.03* 1.38*  CALCIUM 8.6* 8.2* 8.5* 8.2* 8.1*  MG 2.1  --   --  2.0 1.9   GFR: Estimated Creatinine Clearance: 33.2 mL/min (A) (by C-G formula based on SCr of 1.38 mg/dL (H)). Liver Function Tests: Recent Labs  Lab 01/06/21 1359  PROT 5.2*   No results for input(s): LIPASE, AMYLASE in the last 168 hours. No results for input(s): AMMONIA in the last 168  hours. Coagulation Profile: Recent Labs  Lab 01/03/21 0951  INR 1.5*   Cardiac Enzymes: No results for input(s): CKTOTAL, CKMB, CKMBINDEX, TROPONINI in the last 168 hours. BNP (last 3 results) No results for input(s): PROBNP in the last 8760 hours. HbA1C: No results for input(s): HGBA1C in the last 72 hours. CBG: Recent Labs  Lab 01/05/21 2322  GLUCAP 158*   Lipid Profile: No results for input(s): CHOL, HDL, LDLCALC, TRIG, CHOLHDL, LDLDIRECT in the last 72 hours. Thyroid Function Tests: No results for input(s): TSH, T4TOTAL, FREET4, T3FREE, THYROIDAB in the last 72 hours. Anemia Panel: No results for input(s): VITAMINB12, FOLATE, FERRITIN, TIBC, IRON, RETICCTPCT in the last 72 hours. Sepsis Labs: Recent Labs  Lab 01/03/21 1737 01/03/21 2122  PROCALCITON 0.11  --   LATICACIDVEN 1.3 1.2    Recent Results (from the past 240 hour(s))  Resp Panel by RT-PCR (Flu A&B, Covid) Nasopharyngeal Swab     Status: None   Collection Time: 01/03/21 10:14 AM   Specimen: Nasopharyngeal Swab; Nasopharyngeal(NP) swabs in vial transport medium  Result Value Ref Range Status   SARS Coronavirus 2 by RT PCR NEGATIVE NEGATIVE Final    Comment: (NOTE) SARS-CoV-2 target nucleic acids are NOT DETECTED.  The SARS-CoV-2 RNA is generally detectable in upper respiratory specimens during the acute phase of infection. The lowest concentration of SARS-CoV-2 viral copies this assay can detect is 138 copies/mL. A negative result does  not preclude SARS-Cov-2 infection and should not be used as the sole basis for treatment or other patient management decisions. A negative result may occur with  improper specimen collection/handling, submission of specimen other than nasopharyngeal swab, presence of viral mutation(s) within the areas targeted by this assay, and inadequate number of viral copies(<138 copies/mL). A negative result must be combined with clinical observations, patient history, and  epidemiological information. The expected result is Negative.  Fact Sheet for Patients:  BloggerCourse.com  Fact Sheet for Healthcare Providers:  SeriousBroker.it  This test is no t yet approved or cleared by the [place] FDA and  has been authorized for detection and/or diagnosis of SARS-CoV-2 by FDA under an Emergency Use Authorization (EUA). This EUA will remain  in effect (meaning this test can be used) for the duration of the COVID-19 declaration under Section 564(b)(1) of the Act, 21 U.S.C.section 360bbb-3(b)(1), unless the authorization is terminated  or revoked sooner.       Influenza A by PCR NEGATIVE NEGATIVE Final   Influenza B by PCR NEGATIVE NEGATIVE Final    Comment: (NOTE) The Xpert Xpress SARS-CoV-2/FLU/RSV plus assay is intended as an aid in the diagnosis of influenza from Nasopharyngeal swab specimens and should not be used as a sole basis for treatment. Nasal washings and aspirates are unacceptable for Xpert Xpress SARS-CoV-2/FLU/RSV testing.  Fact Sheet for Patients: BloggerCourse.com  Fact Sheet for Healthcare Providers: SeriousBroker.it  This test is not yet approved or cleared by the [place] FDA and has been authorized for detection and/or diagnosis of SARS-CoV-2 by FDA under an Emergency Use Authorization (EUA). This EUA will remain in effect (meaning this test can be used) for the duration of the COVID-19 declaration under Section 564(b)(1) of the Act, 21 U.S.C. section 360bbb-3(b)(1), unless the authorization is terminated or revoked.  Performed at [facility], [address], [place], [place] [address]   Body fluid culture w Gram Stain     Status: None (Preliminary result)   Collection Time: 01/06/21  1:07 PM   Specimen: Lung, Left; Pleural Fluid  Result Value Ref Range Status   Specimen Description PLEURAL FLUID  Final    Special Requests LUNG RIGHT  Final   Gram Stain   Final    RARE WBC PRESENT,BOTH PMN AND MONONUCLEAR NO ORGANISMS SEEN    Culture   Final    NO GROWTH < 24 HOURS Performed at [facility], [address], [place], [place] [address]    Report Status PENDING  Incomplete     Radiology Studies: DG Chest 1 View  Result Date: 01/06/2021 CLINICAL DATA:  Status post thoracentesis on the left. EXAM: CHEST  1 VIEW COMPARISON:  January 05, 2021 FINDINGS: The patient is status post thoracentesis on the left. A left effusion with underlying opacity remains but is smaller. No pneumothorax. A small right pleural effusion with underlying atelectasis remains. No other changes. IMPRESSION: No pneumothorax after left thoracentesis. Bilateral pleural effusions remain with underlying atelectasis. Electronically Signed   By: Gerome Sam III M.D.   On: 01/06/2021 13:26   ECHOCARDIOGRAM LIMITED  Result Date: 01/06/2021    ECHOCARDIOGRAM LIMITED REPORT   Patient Name:   Rebecca Short Date of Exam: 01/06/2021 Medical Rec #:  [id number]     Height:       62.0 in Accession #:    [id number]    Weight:       191.9 lb Date of Birth:  [birth year]  BSA:          1.878 m Patient Age:    79 years      BP:           94/62 mmHg Patient Gender: F             HR:           97 bpm. Exam Location:  Inpatient Procedure: 2D Echo STAT ECHO Indications:    pericardial effusion  History:        Patient has prior history of Echocardiogram examinations, most                 recent 01/04/2021. Arrythmias:Atrial Fibrillation; Risk                 Factors:Sleep Apnea.  Sonographer:    Delcie Roch RDCS Referring Phys: [phone] KENNETH C HILTY IMPRESSIONS  1. Left ventricular ejection fraction, by estimation, is 65 to 70%. The left ventricle has normal function. The left ventricle has no regional wall motion abnormalities.  2. Right ventricular systolic function is normal. The right ventricular size is mildly enlarged.  3. Large  pericardial effusion. The pericardial effusion is circumferential. There is no evidence of cardiac tamponade.  4. The inferior vena cava is normal in size with <50% respiratory variability, suggesting right atrial pressure of 8 mmHg. Comparison(s): Changes from prior study are noted. 01/04/2021: LVEF 65-70%, large pericardial effusion up to 3.7 cm, IVC collapsed, no tamponade physiology. Compared to the prior study 2 days ago, the pericardial effusion is stable - the LV cavity is small and likely preload dependent -the IVC collapses - there are a few features such as diastolic collapse noted and TV inflow variation, however, I suspect this is more respiratory given her large pleural effusion. FINDINGS  Left Ventricle: Left ventricular ejection fraction, by estimation, is 65 to 70%. The left ventricle has normal function. The left ventricle has no regional wall motion abnormalities. Right Ventricle: The right ventricular size is mildly enlarged. Right ventricular systolic function is normal. Pericardium: A large pericardial effusion is present. The pericardial effusion is circumferential. The pericardial effusion appears to contain focal strands and fibrous material. There is diastolic collapse of the right ventricular free wall and excessive respiratory variation in the tricuspid valve spectral Doppler velocities. There is no evidence of cardiac tamponade. Venous: The inferior vena cava is normal in size with less than 50% respiratory variability, suggesting right atrial pressure of 8 mmHg. Zoila Shutter MD Electronically signed by Zoila Shutter MD Signature Date/Time: 01/06/2021/11:22:08 AM    Final    US THORACENTESIS ASP PLEURAL SPACE W/IMG GUIDE  Result Date: 01/06/2021 INDICATION: Patient with a history of permanent atrial fibrillation admitted for AFib with RVR and found to have bilateral pleural effusions as well as a pericardial effusion. Interventional radiology asked to perform a diagnostic and  therapeutic thoracentesis. EXAM: ULTRASOUND GUIDED THORACENTESIS MEDICATIONS: 1% lidocaine 10 mL COMPLICATIONS: None immediate. PROCEDURE: An ultrasound guided thoracentesis was thoroughly discussed with the patient and questions answered. The benefits, risks, alternatives and complications were also discussed. The patient understands and wishes to proceed with the procedure. Written consent was obtained. Ultrasound was performed to localize and mark an adequate pocket of fluid in the left chest. The area was then prepped and draped in the normal sterile fashion. 1% Lidocaine was used for local anesthesia. Under ultrasound guidance a 6 Fr Safe-T-Centesis catheter was introduced. Thoracentesis was performed. The catheter was removed and a dressing applied. FINDINGS: A  total of approximately 950 mL of red/amber fluid was removed. Samples were sent to the laboratory as requested by the clinical team. IMPRESSION: Successful ultrasound guided left thoracentesis yielding 950 mL of pleural fluid. Read by: Alwyn Ren, NP Electronically Signed   By: Gilmer Mor D.O.   On: 01/06/2021 13:39     LOS: 4 days   Huey Bienenstock, MD [facility]  01/07/2021, 12:06 PM

## 2021-01-07 NOTE — Progress Notes (Signed)
Patient placed on home CPAP via Nasal pillows with 2 L O2 bleed in.

## 2021-01-07 NOTE — Plan of Care (Signed)

## 2021-01-07 NOTE — Progress Notes (Signed)
  Echocardiogram 2D Echocardiogram has been performed.  Gerda Diss 01/07/2021, 4:49 PM

## 2021-01-07 NOTE — Progress Notes (Addendum)
Progress Note  Patient Name: Rebecca Short Date of Encounter: 01/07/2021  [facility] Cardiologist: [name], MD   Subjective   She c/o of left chest pain overnight under her left breast wrapping around to the lateral ribs. She is still on O2 and must remain sitting up.   Inpatient Medications    Scheduled Meds:  chlorhexidine  15 mL Mouth Rinse BID   colchicine  0.6 mg Oral BID   digoxin  0.0625 mg Oral Daily   doxycycline  100 mg Oral Q12H   guaiFENesin  600 mg Oral BID   ibuprofen  800 mg Oral TID   metoprolol tartrate  25 mg Oral Q6H   pantoprazole  40 mg Oral BID   sodium chloride flush  3 mL Intravenous Q12H   Continuous Infusions:  cefTRIAXone (ROCEPHIN)  IV Stopped (01/06/21 1120)   heparin 1,350 Units/hr (01/07/21 0518)   PRN Meds: acetaminophen **OR** acetaminophen, albuterol, guaiFENesin-dextromethorphan, HYDROcodone-acetaminophen, ondansetron **OR** ondansetron (ZOFRAN) IV, polyvinyl alcohol   Vital Signs    Vitals:   01/06/21 2133 01/07/21 0043 01/07/21 0435 01/07/21 0756  BP:  107/69 (!) 139/57 118/65  Pulse: 95 94 96 (!) 104  Resp:  14 16 17   Temp:  98 F (36.7 C) (!) 97.5 F (36.4 C) 98.1 F (36.7 C)  TempSrc:  Oral Oral Oral  SpO2:  94% 95% 96%  Weight:   86.5 kg   Height:        Intake/Output Summary (Last 24 hours) at 01/07/2021 0853 Last data filed at 01/07/2021 0518 Gross per 24 hour  Intake 841.18 ml  Output 625 ml  Net 216.18 ml   Last 3 Weights 01/07/2021 01/06/2021 01/05/2021  Weight (lbs) 190 lb 9.6 oz 191 lb 14.4 oz 190 lb 14.4 oz  Weight (kg) 86.456 kg 87.045 kg 86.592 kg      Telemetry    Atrial fibrillation with ventricular rate 100-120s - Personally Reviewed  ECG    No new tracings - Personally Reviewed  Physical Exam   GEN: No acute distress.   Neck: no JVD Cardiac: irregular rhythm, tachycardic rate, +rub Respiratory: breath sounds in bases bilaterally, respirations unlabored GI: Soft, nontender,  non-distended  MS: mild B LE edema; No deformity. Neuro:  Nonfocal  Psych: Normal affect   Labs    High Sensitivity Troponin:   Recent Labs  Lab 12/23/20 1334 12/23/20 1513 01/03/21 0951 01/03/21 1338  TROPONINIHS 2 2 7 5       Chemistry Recent Labs  Lab 01/05/21 0343 01/06/21 0220 01/06/21 1359 01/07/21 0158  NA 132* 134*  --  136  K 3.4* 4.0  --  4.0  CL 98 103  --  106  CO2 25 22  --  22  GLUCOSE 122* 136*  --  110*  BUN 25* 24*  --  32*  CREATININE 0.80 1.03*  --  1.38*  CALCIUM 8.5* 8.2*  --  8.1*  PROT  --   --  5.2*  --   GFRNONAA >60 55*  --  39*  ANIONGAP 9 9  --  8     Hematology Recent Labs  Lab 01/05/21 0343 01/06/21 0220 01/07/21 0158  WBC 9.5 17.3* 10.5  RBC 4.30 4.39 4.30  HGB 12.6 13.1 12.8  HCT 38.5 39.3 38.1  MCV 89.5 89.5 88.6  MCH 29.3 29.8 29.8  MCHC 32.7 33.3 33.6  RDW 13.7 13.7 14.0  PLT 252 308 319    BNP Recent Labs  Lab  01/03/21 0951  BNP 108.1*     DDimer No results for input(s): DDIMER in the last 168 hours.   Radiology    DG Chest 1 View  Result Date: 01/06/2021 CLINICAL DATA:  Status post thoracentesis on the left. EXAM: CHEST  1 VIEW COMPARISON:  January 05, 2021 FINDINGS: The patient is status post thoracentesis on the left. A left effusion with underlying opacity remains but is smaller. No pneumothorax. A small right pleural effusion with underlying atelectasis remains. No other changes. IMPRESSION: No pneumothorax after left thoracentesis. Bilateral pleural effusions remain with underlying atelectasis. Electronically Signed   By: David  Williams III M.D.   On: 01/06/2021 13:26   ECHOCARDIOGRAM LIMITED  Result Date: 01/06/2021    ECHOCARDIOGRAM LIMITED REPORT   Patient Name:   Rebecca Short Date of Exam: 01/06/2021 Medical Rec #:  [id number]     Height:       62.0 in Accession #:    [id number]    Weight:       191.9 lb Date of Birth:  02/16/1941     BSA:          1.878 m Patient Age:    79 years      BP:            94/62 mmHg Patient Gender: F             HR:           97 bpm. Exam Location:  Inpatient Procedure: 2D Echo STAT ECHO Indications:    pericardial effusion  History:        Patient has prior history of Echocardiogram examinations, most                 recent 01/04/2021. Arrythmias:Atrial Fibrillation; Risk                 Factors:Sleep Apnea.  Sonographer:    Lauren Pennington RDCS Referring Phys: [id number] KENNETH C HILTY IMPRESSIONS  1. Left ventricular ejection fraction, by estimation, is 65 to 70%. The left ventricle has normal function. The left ventricle has no regional wall motion abnormalities.  2. Right ventricular systolic function is normal. The right ventricular size is mildly enlarged.  3. Large pericardial effusion. The pericardial effusion is circumferential. There is no evidence of cardiac tamponade.  4. The inferior vena cava is normal in size with <50% respiratory variability, suggesting right atrial pressure of 8 mmHg. Comparison(s): Changes from prior study are noted. 01/04/2021: LVEF 65-70%, large pericardial effusion up to 3.7 cm, IVC collapsed, no tamponade physiology. Compared to the prior study 2 days ago, the pericardial effusion is stable - the LV cavity is small and likely preload dependent -the IVC collapses - there are a few features such as diastolic collapse noted and TV inflow variation, however, I suspect this is more respiratory given her large pleural effusion. FINDINGS  Left Ventricle: Left ventricular ejection fraction, by estimation, is 65 to 70%. The left ventricle has normal function. The left ventricle has no regional wall motion abnormalities. Right Ventricle: The right ventricular size is mildly enlarged. Right ventricular systolic function is normal. Pericardium: A large pericardial effusion is present. The pericardial effusion is circumferential. The pericardial effusion appears to contain focal strands and fibrous material. There is diastolic collapse of the right ventricular  free wall and excessive respiratory variation in the tricuspid valve spectral Doppler velocities. There is no evidence of cardiac tamponade. Venous: The inferior vena cava is normal in size with   less than 50% respiratory variability, suggesting right atrial pressure of 8 mmHg. Zoila Shutter MD Electronically signed by Zoila Shutter MD Signature Date/Time: 01/06/2021/11:22:08 AM    Final    US THORACENTESIS ASP PLEURAL SPACE W/IMG GUIDE  Result Date: 01/06/2021 INDICATION: Patient with a history of permanent atrial fibrillation admitted for AFib with RVR and found to have bilateral pleural effusions as well as a pericardial effusion. Interventional radiology asked to perform a diagnostic and therapeutic thoracentesis. EXAM: ULTRASOUND GUIDED THORACENTESIS MEDICATIONS: 1% lidocaine 10 mL COMPLICATIONS: None immediate. PROCEDURE: An ultrasound guided thoracentesis was thoroughly discussed with the patient and questions answered. The benefits, risks, alternatives and complications were also discussed. The patient understands and wishes to proceed with the procedure. Written consent was obtained. Ultrasound was performed to localize and mark an adequate pocket of fluid in the left chest. The area was then prepped and draped in the normal sterile fashion. 1% Lidocaine was used for local anesthesia. Under ultrasound guidance a 6 Fr Safe-T-Centesis catheter was introduced. Thoracentesis was performed. The catheter was removed and a dressing applied. FINDINGS: A total of approximately 950 mL of red/amber fluid was removed. Samples were sent to the laboratory as requested by the clinical team. IMPRESSION: Successful ultrasound guided left thoracentesis yielding 950 mL of pleural fluid. Read by: Alwyn Ren, NP Electronically Signed   By: Gilmer Mor D.O.   On: 01/06/2021 13:39    Cardiac Studies   Echo limited 01/06/21: 1. Left ventricular ejection fraction, by estimation, is 65 to 70%. The  left ventricle has  normal function. The left ventricle has no regional  wall motion abnormalities.   2. Right ventricular systolic function is normal. The right ventricular  size is mildly enlarged.   3. Large pericardial effusion. The pericardial effusion is  circumferential. There is no evidence of cardiac tamponade.   4. The inferior vena cava is normal in size with <50% respiratory  variability, suggesting right atrial pressure of 8 mmHg.   Patient Profile     80 y.o. female with permanent atrial fibrillation, macular degeneration, OSA on CPAP, obesity, chronic back pain, moderate pericardial effusion, who is being seen 01/03/2021 for the evaluation of A. fib with RVR, worsening edema, exertional dyspnea and weight gain, suspected LLL pneumonia. A left pleural effusion is also seen.  Assessment & Plan    Pericardial effusion - large pericardial effusion, no tamponade physiology seen on limited bedside echo - pt complains of left sided chest pain and shortness of breath, unable lie flat - HR in the 100-120s, permanent Afib - BP stable - continue colchicine + ibuprofen + protonix - will need pericardiocentesis vs window, depending on fluid studies   Pleural effusion Suspected LLL PNA - left thoracentesis yesterday with 950 cc fluid removed - low protein, LDH WNL, and NGTD - breath sounds in lower lobes today - still requiring O2 - will complete day 5 of rocephin and doxycycline today   Permanent atrial fibrillation Chronic anticoagulation - moderately rate controlled in the 100-120s on digoxin and metoprolol q6hr - she seems to be tolerating this for now, suspect rate is due to pericardia effusion - heparin gtt running for possible procedure today - last dose of eliquis 01/02/21 - digoxin dose will be held today for digoxin level 1.2   AKI - sCr 1.38 (normal on admission) - continue to monitor this      For questions or updates, please contact [facility] Please consult [email or web address]  for contact info under  Signed, Angela Nicole Duke, PA  01/07/2021, 8:53 AM    I have examined the patient and reviewed assessment and plan and discussed with patient.  Agree with above as stated.    Patient with pneumonia who later had large pleural effusion and large pericardial effusion.  Pleural effusion was drained yesterday and her breathing is improved.  Heart rate is still elevated.  I think her large pericardial effusion is likely playing a part in this.  There was some thought to delay draining her pericardial space until cytology studies are back.  The thought was if it was malignant, perhaps pericardial window should be considered.  Since this could take several days, I think she will need to undergo pericardiocentesis.  Blood pressure has improved today.  Yesterday, rate control was difficult due to hypotension.  Hopefully with pericardiocentesis, her heart rate will decrease.  She is currently on IV heparin.  She has not had oral anticoagulation since Wednesday of last week.  Heparin can be stopped an hour prior to the procedure.  The patient is willing to proceed.  Delphia Kaylor   

## 2021-01-08 ENCOUNTER — Inpatient Hospital Stay (HOSPITAL_COMMUNITY): Payer: Medicare Other

## 2021-01-08 DIAGNOSIS — I4891 Unspecified atrial fibrillation: Secondary | ICD-10-CM | POA: Diagnosis not present

## 2021-01-08 DIAGNOSIS — I313 Pericardial effusion (noninflammatory): Secondary | ICD-10-CM | POA: Diagnosis not present

## 2021-01-08 DIAGNOSIS — J90 Pleural effusion, not elsewhere classified: Secondary | ICD-10-CM | POA: Diagnosis not present

## 2021-01-08 DIAGNOSIS — Z7901 Long term (current) use of anticoagulants: Secondary | ICD-10-CM | POA: Diagnosis not present

## 2021-01-08 DIAGNOSIS — J189 Pneumonia, unspecified organism: Secondary | ICD-10-CM | POA: Diagnosis not present

## 2021-01-08 LAB — CBC
HCT: 37.4 % (ref 36.0–46.0)
Hemoglobin: 12.4 g/dL (ref 12.0–15.0)
MCH: 29.3 pg (ref 26.0–34.0)
MCHC: 33.2 g/dL (ref 30.0–36.0)
MCV: 88.4 fL (ref 80.0–100.0)
Platelets: 309 10*3/uL (ref 150–400)
RBC: 4.23 MIL/uL (ref 3.87–5.11)
RDW: 13.9 % (ref 11.5–15.5)
WBC: 7.3 10*3/uL (ref 4.0–10.5)
nRBC: 0 % (ref 0.0–0.2)

## 2021-01-08 LAB — BASIC METABOLIC PANEL
Anion gap: 8 (ref 5–15)
BUN: 32 mg/dL — ABNORMAL HIGH (ref 8–23)
CO2: 21 mmol/L — ABNORMAL LOW (ref 22–32)
Calcium: 8.1 mg/dL — ABNORMAL LOW (ref 8.9–10.3)
Chloride: 108 mmol/L (ref 98–111)
Creatinine, Ser: 1.37 mg/dL — ABNORMAL HIGH (ref 0.44–1.00)
GFR, Estimated: 39 mL/min — ABNORMAL LOW (ref >60)
Glucose, Bld: 115 mg/dL — ABNORMAL HIGH (ref 70–99)
Potassium: 3.3 mmol/L — ABNORMAL LOW (ref 3.5–5.1)
Sodium: 137 mmol/L (ref 135–145)

## 2021-01-08 LAB — CYTOLOGY - NON PAP

## 2021-01-08 LAB — ECHOCARDIOGRAM LIMITED
Height: 62 in
Weight: 3033.53 oz

## 2021-01-08 LAB — GLUCOSE, CAPILLARY
Glucose-Capillary: 81 mg/dL (ref 70–99)
Glucose-Capillary: 140 mg/dL — ABNORMAL HIGH (ref 70–99)
Glucose-Capillary: 85 mg/dL (ref 70–99)

## 2021-01-08 LAB — MAGNESIUM: Magnesium: 1.9 mg/dL (ref 1.7–2.4)

## 2021-01-08 LAB — HEPARIN LEVEL (UNFRACTIONATED): Heparin Unfractionated: 0.31 IU/mL (ref 0.30–0.70)

## 2021-01-08 MED ORDER — MAGNESIUM SULFATE IN D5W 1-5 GM/100ML-% IV SOLN
1.0000 g | Freq: Once | INTRAVENOUS | Status: AC
  Administered 2021-01-08: 1 g via INTRAVENOUS
  Filled 2021-01-08: qty 100

## 2021-01-08 MED ORDER — POTASSIUM CHLORIDE CRYS ER 20 MEQ PO TBCR
40.0000 meq | EXTENDED_RELEASE_TABLET | Freq: Four times a day (QID) | ORAL | Status: AC
  Administered 2021-01-08 (×2): 40 meq via ORAL
  Filled 2021-01-08 (×2): qty 2

## 2021-01-08 MED ORDER — SODIUM CHLORIDE 0.9 % IV SOLN
INTRAVENOUS | Status: DC | PRN
  Administered 2021-01-08: 500 mL via INTRAVENOUS

## 2021-01-08 MED ORDER — HEPARIN (PORCINE) 25000 UT/250ML-% IV SOLN
1350.0000 [IU]/h | INTRAVENOUS | Status: DC
  Administered 2021-01-08: 1350 [IU]/h via INTRAVENOUS
  Filled 2021-01-08 (×2): qty 250

## 2021-01-08 NOTE — Progress Notes (Signed)
Progress Note  Patient Name: Rebecca Short Date of Encounter: 01/08/2021  [facility] Cardiologist: [name], MD   Subjective   Feels better after successful pericardiocentesis yesterday  Inpatient Medications    Scheduled Meds:  chlorhexidine  15 mL Mouth Rinse BID   Chlorhexidine Gluconate Cloth  6 each Topical Daily   colchicine  0.6 mg Oral BID   guaiFENesin  600 mg Oral BID   ibuprofen  800 mg Oral TID   metoprolol tartrate  25 mg Oral Q6H   pantoprazole  40 mg Oral BID   potassium chloride  40 mEq Oral Q6H   sodium chloride flush  3 mL Intravenous Q12H   Continuous Infusions:  sodium chloride 10 mL/hr at 01/08/21 1138   heparin 1,350 Units/hr (01/08/21 1135)   PRN Meds: sodium chloride, acetaminophen **OR** acetaminophen, albuterol, guaiFENesin-dextromethorphan, HYDROcodone-acetaminophen, ondansetron **OR** ondansetron (ZOFRAN) IV, polyvinyl alcohol   Vital Signs    Vitals:   01/08/21 0800 01/08/21 0900 01/08/21 1000 01/08/21 1100  BP: 127/61 128/75 126/64 122/61  Pulse:      Resp: (!) 22 (!) 23 (!) 28 (!) 27  Temp:    98.3 F (36.8 C)  TempSrc:    Axillary  SpO2: 94% 98% 96% 96%  Weight:      Height:        Intake/Output Summary (Last 24 hours) at 01/08/2021 1256 Last data filed at 01/08/2021 1138 Gross per 24 hour  Intake 836.61 ml  Output 760 ml  Net 76.61 ml   Last 3 Weights 01/08/2021 01/07/2021 01/06/2021  Weight (lbs) 189 lb 9.5 oz 190 lb 9.6 oz 191 lb 14.4 oz  Weight (kg) 86 kg 86.456 kg 87.045 kg      Telemetry    AFib, rate controlled - Personally Reviewed  ECG    AFib with RVR - Personally Reviewed  Physical Exam   GEN: No acute distress.   Neck: No JVD Cardiac: irregular, no murmurs, rubs, or gallops.  Respiratory: Clear to auscultation bilaterally. GI: Soft, nontender, non-distended  MS: No edema; No deformity. Neuro:  Nonfocal  Psych: Normal affect   Labs    High Sensitivity Troponin:   Recent Labs  Lab  12/23/20 1334 12/23/20 1513 01/03/21 0951 01/03/21 1338  TROPONINIHS 2 2 7 5       Chemistry Recent Labs  Lab 01/06/21 0220 01/06/21 1359 01/07/21 0158 01/08/21 0053  NA 134*  --  136 137  K 4.0  --  4.0 3.3*  CL 103  --  106 108  CO2 22  --  22 21*  GLUCOSE 136*  --  110* 115*  BUN 24*  --  32* 32*  CREATININE 1.03*  --  1.38* 1.37*  CALCIUM 8.2*  --  8.1* 8.1*  PROT  --  5.2*  --   --   GFRNONAA 55*  --  39* 39*  ANIONGAP 9  --  8 8     Hematology Recent Labs  Lab 01/06/21 0220 01/07/21 0158 01/08/21 0053  WBC 17.3* 10.5 7.3  RBC 4.39 4.30 4.23  HGB 13.1 12.8 12.4  HCT 39.3 38.1 37.4  MCV 89.5 88.6 88.4  MCH 29.8 29.8 29.3  MCHC 33.3 33.6 33.2  RDW 13.7 14.0 13.9  PLT 308 319 309    BNP Recent Labs  Lab 01/03/21 0951  BNP 108.1*     DDimer No results for input(s): DDIMER in the last 168 hours.   Radiology    DG Chest 1  View  Result Date: 01/06/2021 CLINICAL DATA:  Status post thoracentesis on the left. EXAM: CHEST  1 VIEW COMPARISON:  January 05, 2021 FINDINGS: The patient is status post thoracentesis on the left. A left effusion with underlying opacity remains but is smaller. No pneumothorax. A small right pleural effusion with underlying atelectasis remains. No other changes. IMPRESSION: No pneumothorax after left thoracentesis. Bilateral pleural effusions remain with underlying atelectasis. Electronically Signed   By: Gerome Sam III M.D.   On: 01/06/2021 13:26   CARDIAC CATHETERIZATION  Result Date: 01/07/2021 Conclusions: Successful pericardiocentesis and pericardial drain placement via the subxiphoid approach, yielding 570 mL of serosanguinous fluid. Recommendations: Repeat limited echo tomorrow to ensure pericardial effusion is not reaccumulating. Maintain pericardial drain to vacuum drain placed in cath lab; record output every 4 hours. Obtain portable chest radiograph. Yvonne Kendall, MD [facility]  DG CHEST PORT 1 VIEW  Result  Date: 01/07/2021 CLINICAL DATA:  Status post pericardiocentesis. EXAM: PORTABLE CHEST 1 VIEW COMPARISON:  Radiograph yesterday.  CT 01/03/2021 FINDINGS: Enlargement of the cardiopericardial silhouette persists, however slight decreased globular component likely sequela of pericardiocentesis. There is a presumed pericardial drain in place. Persistent bilateral pleural effusions and atelectasis. No pneumothorax. IMPRESSION: 1. Persistent enlargement of the cardiopericardial silhouette, however slight decreased globular appearance likely related to pericardiocentesis. Presumed pericardial drain in place. 2. Persistent bilateral pleural effusions and atelectasis. Electronically Signed   By: Narda Rutherford M.D.   On: 01/07/2021 18:56   ECHOCARDIOGRAM LIMITED  Result Date: 01/08/2021    ECHOCARDIOGRAM LIMITED REPORT   Patient Name:   Rebecca Short Date of Exam: 01/08/2021 Medical Rec #:  [id number]     Height:       62.0 in Accession #:    [id number]    Weight:       189.6 lb Date of Birth:  [birth year]     BSA:          1.869 m Patient Age:    80 years      BP:           140/67 mmHg Patient Gender: F             HR:           85 bpm. Exam Location:  Inpatient Procedure: Limited Echo and Limited Color Doppler Indications:    Pericardial effusion follow up  History:        Patient has prior history of Echocardiogram examinations, most                 recent 01/07/2021. 01/07/21 Pericardiocentesis.  Sonographer:    Neomia Dear RDCS Referring Phys: [id number] CHRISTOPHER END IMPRESSIONS  1. Left ventricular ejection fraction, by estimation, is 60 to 65%. The left ventricle has normal function. The left ventricle has no regional wall motion abnormalities.  2. Right ventricular systolic function is normal. The right ventricular size is normal. There is normal pulmonary artery systolic pressure.  3. Post pericardiocentesis with no reaccumulation Persistent large left pleural effusion . The pericardial effusion is posterior to the  left ventricle. Large pleural effusion in the left lateral region.  4. The mitral valve is abnormal. Trivial mitral valve regurgitation. No evidence of mitral stenosis.  5. The aortic valve is tricuspid. There is mild calcification of the aortic valve. Aortic valve regurgitation is not visualized. Mild aortic valve sclerosis is present, with no evidence of aortic valve stenosis.  6. The inferior vena cava is normal in size with greater than  50% respiratory variability, suggesting right atrial pressure of 3 mmHg. FINDINGS  Left Ventricle: Left ventricular ejection fraction, by estimation, is 60 to 65%. The left ventricle has normal function. The left ventricle has no regional wall motion abnormalities. The left ventricular internal cavity size was normal in size. There is  no left ventricular hypertrophy. Right Ventricle: The right ventricular size is normal. No increase in right ventricular wall thickness. Right ventricular systolic function is normal. There is normal pulmonary artery systolic pressure. The tricuspid regurgitant velocity is 2.40 m/s, and  with an assumed right atrial pressure of 3 mmHg, the estimated right ventricular systolic pressure is 26.0 mmHg. Left Atrium: Left atrial size was normal in size. Right Atrium: Right atrial size was normal in size. Pericardium: Post pericardiocentesis with no reaccumulation Persistent large left pleural effusion. Trivial pericardial effusion is present. The pericardial effusion is posterior to the left ventricle. Mitral Valve: The mitral valve is abnormal. There is mild thickening of the mitral valve leaflet(s). There is mild calcification of the mitral valve leaflet(s). Trivial mitral valve regurgitation. No evidence of mitral valve stenosis. Tricuspid Valve: The tricuspid valve is not assessed. Tricuspid valve regurgitation is not demonstrated. No evidence of tricuspid stenosis. Aortic Valve: The aortic valve is tricuspid. There is mild calcification of the  aortic valve. Aortic valve regurgitation is not visualized. Mild aortic valve sclerosis is present, with no evidence of aortic valve stenosis. Pulmonic Valve: The pulmonic valve was not assessed. Pulmonic valve regurgitation is not visualized. No evidence of pulmonic stenosis. Aorta: The aortic root is normal in size and structure. Venous: The inferior vena cava is normal in size with greater than 50% respiratory variability, suggesting right atrial pressure of 3 mmHg. IAS/Shunts: No atrial level shunt detected by color flow Doppler. Additional Comments: There is a large pleural effusion in the left lateral region. LEFT ATRIUM           Index LA Vol (A4C): 35.3 ml 18.89 ml/m  MV E velocity: 88.70 cm/s  TRICUSPID VALVE                            TR Peak grad:   23.0 mmHg                            TR Vmax:        240.00 cm/s Charlton Haws MD Electronically signed by Charlton Haws MD Signature Date/Time: 01/08/2021/12:36:44 PM    Final    ECHOCARDIOGRAM LIMITED  Result Date: 01/07/2021    ECHOCARDIOGRAM LIMITED REPORT   Patient Name:   CORRENA MEACHAM Date of Exam: 01/07/2021 Medical Rec #:  [id number]     Height:       62.0 in Accession #:    [id number]    Weight:       190.6 lb Date of Birth:  Feb 16, 1941     BSA:          1.873 m Patient Age:    80 years      BP:           142/69 mmHg Patient Gender: F             HR:           129 bpm. Exam Location:  Inpatient Procedure: Limited Echo Indications:    Pericardial effusion  Pericardiocentesis  History:        Patient has prior history of Echocardiogram examinations, most                 recent 01/06/2021. Arrythmias:Atrial Fibrillation; Risk                 Factors:Sleep Apnea.  Sonographer:    Ross Ludwig RDCS (AE) Referring Phys: [id number] CHRISTOPHER END  Sonographer Comments: Technically difficult study due to poor echo windows. IMPRESSIONS  1. Left ventricular ejection fraction, by estimation, is 65 to 70%. The left ventricle has normal function. Small LV  prior to procedure. Post procedure LV size is normal.  2. Large pericardial effusion. The pericardial effusion is anterior to the right ventricle. Through study pericardial effusion moves from large to small. No evidence of RV collapse at end of study. Limited study for pericardiocentesis. Comparison(s): A prior study was performed on 01/06/21. Similar effusion prior to procedure. FINDINGS  Left Ventricle: Left ventricular ejection fraction, by estimation, is 65 to 70%. The left ventricle has normal function. The left ventricular internal cavity size was small. Pericardium: A large pericardial effusion is present. The pericardial effusion is anterior to the right ventricle. Riley Lam MD Electronically signed by Riley Lam MD Signature Date/Time: 01/07/2021/5:10:12 PM    Final    US THORACENTESIS ASP PLEURAL SPACE W/IMG GUIDE  Result Date: 01/06/2021 INDICATION: Patient with a history of permanent atrial fibrillation admitted for AFib with RVR and found to have bilateral pleural effusions as well as a pericardial effusion. Interventional radiology asked to perform a diagnostic and therapeutic thoracentesis. EXAM: ULTRASOUND GUIDED THORACENTESIS MEDICATIONS: 1% lidocaine 10 mL COMPLICATIONS: None immediate. PROCEDURE: An ultrasound guided thoracentesis was thoroughly discussed with the patient and questions answered. The benefits, risks, alternatives and complications were also discussed. The patient understands and wishes to proceed with the procedure. Written consent was obtained. Ultrasound was performed to localize and mark an adequate pocket of fluid in the left chest. The area was then prepped and draped in the normal sterile fashion. 1% Lidocaine was used for local anesthesia. Under ultrasound guidance a 6 Fr Safe-T-Centesis catheter was introduced. Thoracentesis was performed. The catheter was removed and a dressing applied. FINDINGS: A total of approximately 950 mL of red/amber fluid  was removed. Samples were sent to the laboratory as requested by the clinical team. IMPRESSION: Successful ultrasound guided left thoracentesis yielding 950 mL of pleural fluid. Read by: Alwyn Ren, NP Electronically Signed   By: Gilmer Mor D.O.   On: 01/06/2021 13:39    Cardiac Studies   I personally reviewed the echo from today and there is a minimal pericardial effusion but a significant left-sided pleural effusion.  Patient Profile     80 y.o. female with recent pneumonia, pleural effusion, pericardial effusion both status post drainage.  She also has atrial fibrillation.  Assessment & Plan    A. fib: Rate control has improved significantly since pericardiocentesis.  We will not restart digoxin.  Hopefully, she can get back on her home dose of metoprolol which she had been on and well controlled for quite some time.  Pleural effusion: Check chest x-ray in a.m.  If effusion is small and there is no concern that she will need repeat thoracentesis, okay to restart Eliquis.  I removed her pigtail catheter from the pericardial space after reviewing today's echocardiogram.  She tolerated that procedure well.  Okay to transfer to telemetry.     For questions or updates, please contact [facility] Please consult [email or web address] for contact info under        Signed, [name], MD  01/08/2021, 12:56 PM

## 2021-01-08 NOTE — Progress Notes (Signed)
ANTICOAGULATION CONSULT NOTE   Pharmacy Consult for Heparin Indication: atrial fibrillation  Allergies  Allergen Reactions   Azithromycin Other (See Comments)    Pain in arm with IV infusion    Condrolite [Glucosamine-Chondroitin-Msm] Rash    Patient Measurements: Height: 5\' 2"  (157.5 cm) Weight: 86.5 kg (190 lb 9.6 oz) IBW/kg (Calculated) : 50.1 Heparin Dosing Weight: 69 kg  Vital Signs: Temp: 98.3 F (36.8 C) (08/15 2300) Temp Source: Oral (08/15 2300) BP: 95/63 (08/16 0000) Pulse Rate: 107 (08/15 2000)  Labs: Recent Labs    01/05/21 0343 01/05/21 1806 01/06/21 0220 01/07/21 0158 01/08/21 0053  HGB 12.6  --  13.1 12.8 12.4  HCT 38.5  --  39.3 38.1 37.4  PLT 252  --  308 319 309  APTT 113* 65*  --   --   --   HEPARINUNFRC 0.81* 0.54 0.69 0.36 0.31  CREATININE 0.80  --  1.03* 1.38* 1.37*     Estimated Creatinine Clearance: 33.5 mL/min (A) (by C-G formula based on SCr of 1.37 mg/dL (H)).   Medical History: Past Medical History:  Diagnosis Date   A-fib [facility])    Allergy    Atrial fibrillation (HCC)    Macular degeneration    OSA (obstructive sleep apnea)    mild with total AHI 7.25/hr and severe during REM sleep at 35/hr now on CPAP at 6cm H2O    Assessment: [facility] is an 80 YO female who presented to the ED w/ SOB and afib. She was diaphoretic, had chest pain, and increased palpitations. She takes Eliquis 5 mg BID at home and LD was 1900 8/10. Apixaban is on hold in anticipation for the possible need for pericardiocentesis or surgical window. Plan for echo on Monday, 01/07/21.   8/16 AM update:  Heparin level therapeutic  Hgb stable  Goal of Therapy:  Heparin level 0.3-0.7 units/ml Monitor platelets by anticoagulation protocol: Yes   Plan:  -Cont heparin at 1350 units/hr -Daily CBC/HL -Monitor for bleeding  01/09/21, PharmD, BCPS Clinical Pharmacist Phone: [phone]

## 2021-01-08 NOTE — Progress Notes (Addendum)
ANTICOAGULATION CONSULT NOTE   Pharmacy Consult for Heparin Indication: atrial fibrillation  Allergies  Allergen Reactions   Azithromycin Other (See Comments)    Pain in arm with IV infusion    Condrolite [Glucosamine-Chondroitin-Msm] Rash    Patient Measurements: Height: 5\' 2"  (157.5 cm) Weight: 86 kg (189 lb 9.5 oz) IBW/kg (Calculated) : 50.1 Heparin Dosing Weight: 69 kg  Vital Signs: Temp: 98.2 F (36.8 C) (08/16 0400) Temp Source: Oral (08/16 0400) BP: 122/67 (08/16 0600) Pulse Rate: 107 (08/15 2000)  Labs: Recent Labs    01/05/21 1806 01/05/21 1806 01/06/21 0220 01/07/21 0158 01/08/21 0053  HGB  --    < > 13.1 12.8 12.4  HCT  --   --  39.3 38.1 37.4  PLT  --   --  308 319 309  APTT 65*  --   --   --   --   HEPARINUNFRC 0.54  --  0.69 0.36 0.31  CREATININE  --   --  1.03* 1.38* 1.37*   < > = values in this interval not displayed.     Estimated Creatinine Clearance: 33.3 mL/min (A) (by C-G formula based on SCr of 1.37 mg/dL (H)).   Medical History: Past Medical History:  Diagnosis Date   A-fib [facility])    Allergy    Atrial fibrillation (HCC)    Macular degeneration    OSA (obstructive sleep apnea)    mild with total AHI 7.25/hr and severe during REM sleep at 35/hr now on CPAP at 6cm H2O    Assessment: Rebecca Short, Rebecca Short is an 80 YO female who presented to the ED w/ SOB and afib. She was diaphoretic, had chest pain, and increased palpitations. She takes Eliquis 5 mg BID at home and LD was 1900 8/10. Apixaban is on hold in anticipation for the possible need for pericardiocentesis or surgical window. Plan for echo on Monday, 01/07/21.  Heparin level 0.3 at goal on heparin drip 1350 uts/hr. Infusion was paused for drain removal at ~1400 and will resume at 1700. No s/sx bleeding or infusion complications. CBC stable.   Goal of Therapy:  Heparin level 0.3-0.7 units/ml Monitor platelets by anticoagulation protocol: Yes   Plan:  -Continue heparin at 1350 units/hr -Daily  heparin level and CBC -Monitor s/sx bleeding   01/09/21, PharmD PGY1 Pharmacy Resident 01/08/2021  6:40 AM  Please check [email or web address] for unit-specific pharmacy phone numbers.

## 2021-01-08 NOTE — Progress Notes (Signed)
PROGRESS NOTE    Rebecca Short  OAC:[id number] DOB: 05-08-1941 DOA: 01/03/2021 PCP: Daisy Floro, MD   Chief Complaint  Patient presents with   Atrial Fibrillation    Brief Narrative:  80 year old female with history of permanent A. fib on Eliquis, morbid obesity BMI 34 with OSA on CPAP presents with cough, shortness of breath progressively worse since leaving the hospital but he was hospitalized 7/31-8/2 for A. fib with RVR, hypotension.  Work-up in the ED showed A. fib with RVR, pneumonia and was admitted for further work-up, her work-up was significant for pericardial effusion, left pleural effusion, status postthoracentesis 8/14, work-up significant for exudate, and pericardial effusion without evidence of tamponade, status post pericardial effusion 8/15 with 570 cc fluid drained.  Subjective:  Ports she is feeling a lot better today, dyspnea has significantly improved, she denies any further cough.   Assessment & Plan:  Large and growing in size pericardial effusion with evidence of increasing right atrial pressure without tamponade:  -No evidence of lung mass on the CT, has had effusion since 7/31st.  -On colchicine and NSAIDs. -Management per cardiology, status post pericardiocentesis 8/15 with 570 cc drained. -Follow on cytology.  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  Appreciate cardiology input . -Management per cardiology Net IO Since Admission: 2,701.02 mL [01/08/21 1245]   Intake/Output Summary (Last 24 hours) at 01/08/2021 1245 Last data filed at 01/08/2021 1138 Gross per 24 hour  Intake 836.61 ml  Output 760 ml  Net 76.61 ml   Filed Weights   01/06/21 0602 01/07/21 0435 01/08/21 0600  Weight: 87 kg 86.5 kg 86 kg    Permanent atrial fibrillation with RVR, -  currently rate controlled on metoprolol 25 mg every 6 hours. - digoxin on hold today given level 1.2. -    Anticoagulation w/ heparin  Hyponatremia: Monitor Hypokalemia: replete  OSA on CPAP: Continue same Morbid Obesity w/ BMI 34.9: Will benefit with weight loss, healthy lifestyle and PCP follow-up  Leukocytosis/Community acquired pneumonia / left pleural effusion Presented with leukocytosis Pro-Cal 0.11.  Received total 5 days of antibiotics. -Significant left pleural effusion status postthoracentesis of 950 cc on 8/14.  Exudative given LDL ratio is >0.6, so it is most likely parapneumonic versus inflammatory process(may be same process contributing to her pericardial effusion) - follow on cytology  Recent Labs  Lab 01/03/21 1737 01/03/21 2122 01/04/21 0228 01/05/21 0343 01/06/21 0220 01/07/21 0158 01/08/21 0053  WBC  --   --  9.3 9.5 17.3* 10.5 7.3  LATICACIDVEN 1.3 1.2  --   --   --   --   --   PROCALCITON 0.11  --   --   --   --   --   --     Diet Order             Diet Carb Modified Fluid consistency: Thin; Room service appropriate? Yes  Diet effective now                   Patient's Body mass index is 34.68 kg/m.  DVT prophylaxis: SCD.  On heparin GTT Code Status:   Code Status: Full Code  Family Communication: discussed with patient, none at bedside. Status is: Inpatient Remains inpatient appropriate because:Ongoing diagnostic testing needed not appropriate for outpatient work up and Inpatient level of care appropriate due to severity of illness  Dispo: The patient is from: Home  Anticipated d/c is to: Home              Patient currently is not medically stable to d/c.   Difficult to place patient No Unresulted Labs (From admission, onward)     Start     Ordered   01/07/21 1604  LD, Body Fluid (other)  RELEASE UPON ORDERING,   TIMED       Comments: Specimen A: Pre-op diagnosis: pericardiocentesis    01/07/21 1604   01/07/21 1604  Gram Stain/Body Fluid Culture  RELEASE UPON ORDERING,   TIMED       Comments: Specimen A: Pre-op  diagnosis: pericardiocentesis    01/07/21 1604   01/07/21 1604  PH, Body Fluid  RELEASE UPON ORDERING,   TIMED       Comments: Specimen A: Pre-op diagnosis: pericardiocentesis    01/07/21 1604   01/07/21 1604  Glucose, Body Fluid Other  RELEASE UPON ORDERING,   TIMED       Comments: Specimen A: Pre-op diagnosis: pericardiocentesis    01/07/21 1604   01/07/21 1604  Protein, body fluid (other)  RELEASE UPON ORDERING,   TIMED       Comments: Specimen A: Pre-op diagnosis: pericardiocentesis    01/07/21 1604   01/06/21 0837  Basic metabolic panel  Daily,   R      01/06/21 0836   01/06/21 0837  Magnesium  Daily,   R      01/06/21 0836   01/05/21 0500  Heparin level (unfractionated)  Daily,   R      01/03/21 1659   01/05/21 0500  CBC  Daily,   R      01/04/21 1438           Medications reviewed:  Scheduled Meds:  chlorhexidine  15 mL Mouth Rinse BID   Chlorhexidine Gluconate Cloth  6 each Topical Daily   colchicine  0.6 mg Oral BID   guaiFENesin  600 mg Oral BID   ibuprofen  800 mg Oral TID   metoprolol tartrate  25 mg Oral Q6H   pantoprazole  40 mg Oral BID   potassium chloride  40 mEq Oral Q6H   sodium chloride flush  3 mL Intravenous Q12H   Continuous Infusions:  sodium chloride 10 mL/hr at 01/08/21 1138   heparin 1,350 Units/hr (01/08/21 1135)   Consultants: - cardiology  Procedures: -Thoracentesis 8/14. -Pericardiocentesis 8/15  Antimicrobials: Anti-infectives (From admission, onward)    Start     Dose/Rate Route Frequency Ordered Stop   01/04/21 1200  cefTRIAXone (ROCEPHIN) 1 g in sodium chloride 0.9 % 100 mL IVPB  Status:  Discontinued        1 g 200 mL/hr over 30 Minutes Intravenous  Once 01/03/21 1636 01/03/21 1713   01/04/21 1000  cefTRIAXone (ROCEPHIN) 1 g in sodium chloride 0.9 % 100 mL IVPB        1 g 200 mL/hr over 30 Minutes Intravenous Every 24 hours 01/03/21 1713 01/08/21 0800   01/04/21 1000  doxycycline (VIBRA-TABS) tablet 100 mg         100 mg Oral Every 12 hours 01/04/21 0738 01/07/21 2027   01/03/21 1215  cefTRIAXone (ROCEPHIN) 1 g in sodium chloride 0.9 % 100 mL IVPB        1 g 200 mL/hr over 30 Minutes Intravenous  Once 01/03/21 1209 01/03/21 1355   01/03/21 1215  azithromycin (ZITHROMAX) 500 mg in sodium chloride 0.9 % 250 mL IVPB  500 mg 250 mL/hr over 60 Minutes Intravenous  Once 01/03/21 1209 01/03/21 1502      Culture/Microbiology    Component Value Date/Time   SDES FLUID PERICARDIAL 01/07/2021 1604   SPECREQUEST NONE 01/07/2021 1604   CULT  01/07/2021 1604    NO GROWTH < 12 HOURS Performed at [facility], [address] [place], [place] [address]    REPTSTATUS PENDING 01/07/2021 1604    Other culture-see note  Objective: Vitals: Today's Vitals   01/08/21 0800 01/08/21 0900 01/08/21 1000 01/08/21 1100  BP: 127/61 128/75 126/64 122/61  Pulse:      Resp: (!) 22 (!) 23 (!) 28 (!) 27  Temp:    98.3 F (36.8 C)  TempSrc:    Axillary  SpO2: 94% 98% 96% 96%  Weight:      Height:      PainSc: 0-No pain  0-No pain     Intake/Output Summary (Last 24 hours) at 01/08/2021 1245 Last data filed at 01/08/2021 1138 Gross per 24 hour  Intake 836.61 ml  Output 760 ml  Net 76.61 ml   Filed Weights   01/06/21 0602 01/07/21 0435 01/08/21 0600  Weight: 87 kg 86.5 kg 86 kg   Weight change: -0.456 kg  Intake/Output from previous day: 08/15 0701 - 08/16 0700 In: 356.4 [P.O.:240; I.V.:116.4] Out: 360 [Urine:350; Drains:10] Intake/Output this shift: Total I/O In: 480.2 [P.O.:360; I.V.:19.8; IV Piggyback:100.4] Out: 500 [Stool:500] Filed Weights   01/06/21 0602 01/07/21 0435 01/08/21 0600  Weight: 87 kg 86.5 kg 86 kg   Examination:  Awake Alert, Oriented X 3, No new F.N deficits, Normal affect Symmetrical Chest wall movement, diminished air entry at the bases RRR,No Gallops,Rubs or new Murmurs, No Parasternal Heave, pericardial drain in lower mid chest . +ve B.Sounds, Abd Soft, No  tenderness, No rebound - guarding or rigidity. No Cyanosis, Clubbing or edema, No new Rash or bruise      CBC: Recent Labs  Lab 01/03/21 0951 01/04/21 0228 01/05/21 0343 01/06/21 0220 01/07/21 0158 01/08/21 0053  WBC 12.6* 9.3 9.5 17.3* 10.5 7.3  NEUTROABS 11.1*  --   --   --   --   --   HGB 14.2 13.1 12.6 13.1 12.8 12.4  HCT 42.9 39.3 38.5 39.3 38.1 37.4  MCV 89.7 89.1 89.5 89.5 88.6 88.4  PLT 293 272 252 308 319 309   Basic Metabolic Panel: Recent Labs  Lab 01/03/21 0951 01/04/21 0228 01/05/21 0343 01/06/21 0220 01/07/21 0158 01/08/21 0053  NA 130* 131* 132* 134* 136 137  K 4.3 3.7 3.4* 4.0 4.0 3.3*  CL 96* 97* 98 103 106 108  CO2 21*  GLUCOSE 121* 108* 122* 136* 110* 115*  BUN 12 16 25* 24* 32* 32*  CREATININE 0.85 0.80 0.80 1.03* 1.38* 1.37*  CALCIUM 8.6* 8.2* 8.5* 8.2* 8.1* 8.1*  MG 2.1  --   --  2.0 1.9 1.9   GFR: Estimated Creatinine Clearance: 33.3 mL/min (A) (by C-G formula based on SCr of 1.37 mg/dL (H)). Liver Function Tests: Recent Labs  Lab 01/06/21 1359  PROT 5.2*   No results for input(s): LIPASE, AMYLASE in the last 168 hours. No results for input(s): AMMONIA in the last 168 hours. Coagulation Profile: Recent Labs  Lab 01/03/21 0951  INR 1.5*   Cardiac Enzymes: No results for input(s): CKTOTAL, CKMB, CKMBINDEX, TROPONINI in the last 168 hours. BNP (last 3 results) No results for input(s): PROBNP in the last  8760 hours. HbA1C: No results for input(s): HGBA1C in the last 72 hours. CBG: Recent Labs  Lab 01/05/21 2322 01/07/21 2138 01/08/21 0812 01/08/21 1135  GLUCAP 158* 136* 81 140*   Lipid Profile: No results for input(s): CHOL, HDL, LDLCALC, TRIG, CHOLHDL, LDLDIRECT in the last 72 hours. Thyroid Function Tests: No results for input(s): TSH, T4TOTAL, FREET4, T3FREE, THYROIDAB in the last 72 hours. Anemia Panel: No results for input(s): VITAMINB12, FOLATE, FERRITIN, TIBC, IRON, RETICCTPCT in the last 72  hours. Sepsis Labs: Recent Labs  Lab 01/03/21 1737 01/03/21 2122  PROCALCITON 0.11  --   LATICACIDVEN 1.3 1.2    Recent Results (from the past 240 hour(s))  Resp Panel by RT-PCR (Flu A&B, Covid) Nasopharyngeal Swab     Status: None   Collection Time: 01/03/21 10:14 AM   Specimen: Nasopharyngeal Swab; Nasopharyngeal(NP) swabs in vial transport medium  Result Value Ref Range Status   SARS Coronavirus 2 by RT PCR NEGATIVE NEGATIVE Final    Comment: (NOTE) SARS-CoV-2 target nucleic acids are NOT DETECTED.  The SARS-CoV-2 RNA is generally detectable in upper respiratory specimens during the acute phase of infection. The lowest concentration of SARS-CoV-2 viral copies this assay can detect is 138 copies/mL. A negative result does not preclude SARS-Cov-2 infection and should not be used as the sole basis for treatment or other patient management decisions. A negative result may occur with  improper specimen collection/handling, submission of specimen other than nasopharyngeal swab, presence of viral mutation(s) within the areas targeted by this assay, and inadequate number of viral copies(<138 copies/mL). A negative result must be combined with clinical observations, patient history, and epidemiological information. The expected result is Negative.  Fact Sheet for Patients:  BloggerCourse.com  Fact Sheet for Healthcare Providers:  SeriousBroker.it  This test is no t yet approved or cleared by the [place] FDA and  has been authorized for detection and/or diagnosis of SARS-CoV-2 by FDA under an Emergency Use Authorization (EUA). This EUA will remain  in effect (meaning this test can be used) for the duration of the COVID-19 declaration under Section 564(b)(1) of the Act, 21 U.S.C.section 360bbb-3(b)(1), unless the authorization is terminated  or revoked sooner.       Influenza A by PCR NEGATIVE NEGATIVE Final   Influenza B  by PCR NEGATIVE NEGATIVE Final    Comment: (NOTE) The Xpert Xpress SARS-CoV-2/FLU/RSV plus assay is intended as an aid in the diagnosis of influenza from Nasopharyngeal swab specimens and should not be used as a sole basis for treatment. Nasal washings and aspirates are unacceptable for Xpert Xpress SARS-CoV-2/FLU/RSV testing.  Fact Sheet for Patients: BloggerCourse.com  Fact Sheet for Healthcare Providers: SeriousBroker.it  This test is not yet approved or cleared by the [place] FDA and has been authorized for detection and/or diagnosis of SARS-CoV-2 by FDA under an Emergency Use Authorization (EUA). This EUA will remain in effect (meaning this test can be used) for the duration of the COVID-19 declaration under Section 564(b)(1) of the Act, 21 U.S.C. section 360bbb-3(b)(1), unless the authorization is terminated or revoked.  Performed at [facility], [address], [place], [place] [address]   Body fluid culture w Gram Stain     Status: None (Preliminary result)   Collection Time: 01/06/21  1:07 PM   Specimen: Lung, Left; Pleural Fluid  Result Value Ref Range Status   Specimen Description PLEURAL FLUID  Final   Special Requests LUNG RIGHT  Final   Gram Stain   Final  RARE WBC PRESENT,BOTH PMN AND MONONUCLEAR NO ORGANISMS SEEN    Culture   Final    NO GROWTH 2 DAYS Performed at [facility], [address], [place], [place] [address]    Report Status PENDING  Incomplete  Body fluid culture w Gram Stain     Status: None (Preliminary result)   Collection Time: 01/07/21  4:04 PM   Specimen: PATH Cytology Misc. fluid; Body Fluid  Result Value Ref Range Status   Specimen Description FLUID PERICARDIAL  Final   Special Requests NONE  Final   Gram Stain   Final    NO SQUAMOUS EPITHELIAL CELLS SEEN ABUNDANT WBC PRESENT, PREDOMINANTLY MONONUCLEAR NO ORGANISMS SEEN    Culture   Final    NO GROWTH < 12  HOURS Performed at [facility], [address] [place], [place] [address]    Report Status PENDING  Incomplete  MRSA Next Gen by PCR, Nasal     Status: None   Collection Time: 01/07/21  6:13 PM   Specimen: Nasal Mucosa; Nasal Swab  Result Value Ref Range Status   MRSA by PCR Next Gen NOT DETECTED NOT DETECTED Final    Comment: (NOTE) The GeneXpert MRSA Assay (FDA approved for NASAL specimens only), is one component of a comprehensive MRSA colonization surveillance program. It is not intended to diagnose MRSA infection nor to guide or monitor treatment for MRSA infections. Test performance is not FDA approved in patients less than 54 years old. Performed at [facility], [address], [place], [place] [address]      Radiology Studies: DG Chest 1 View  Result Date: 01/06/2021 CLINICAL DATA:  Status post thoracentesis on the left. EXAM: CHEST  1 VIEW COMPARISON:  January 05, 2021 FINDINGS: The patient is status post thoracentesis on the left. A left effusion with underlying opacity remains but is smaller. No pneumothorax. A small right pleural effusion with underlying atelectasis remains. No other changes. IMPRESSION: No pneumothorax after left thoracentesis. Bilateral pleural effusions remain with underlying atelectasis. Electronically Signed   By: Gerome Sam III M.D.   On: 01/06/2021 13:26   CARDIAC CATHETERIZATION  Result Date: 01/07/2021 Conclusions: Successful pericardiocentesis and pericardial drain placement via the subxiphoid approach, yielding 570 mL of serosanguinous fluid. Recommendations: Repeat limited echo tomorrow to ensure pericardial effusion is not reaccumulating. Maintain pericardial drain to vacuum drain placed in cath lab; record output every 4 hours. Obtain portable chest radiograph. Yvonne Kendall, MD [facility]  DG CHEST PORT 1 VIEW  Result Date: 01/07/2021 CLINICAL DATA:  Status post pericardiocentesis. EXAM: PORTABLE CHEST 1 VIEW  COMPARISON:  Radiograph yesterday.  CT 01/03/2021 FINDINGS: Enlargement of the cardiopericardial silhouette persists, however slight decreased globular component likely sequela of pericardiocentesis. There is a presumed pericardial drain in place. Persistent bilateral pleural effusions and atelectasis. No pneumothorax. IMPRESSION: 1. Persistent enlargement of the cardiopericardial silhouette, however slight decreased globular appearance likely related to pericardiocentesis. Presumed pericardial drain in place. 2. Persistent bilateral pleural effusions and atelectasis. Electronically Signed   By: Narda Rutherford M.D.   On: 01/07/2021 18:56   ECHOCARDIOGRAM LIMITED  Result Date: 01/08/2021    ECHOCARDIOGRAM LIMITED REPORT   Patient Name:   CHARLI HALLE Date of Exam: 01/08/2021 Medical Rec #:  [id number]     Height:       62.0 in Accession #:    [id number]    Weight:       189.6 lb Date of Birth:  14-Nov-1940  BSA:          1.869 m Patient Age:    79 years      BP:           140/67 mmHg Patient Gender: F             HR:           85 bpm. Exam Location:  Inpatient Procedure: Limited Echo and Limited Color Doppler Indications:    Pericardial effusion follow up  History:        Patient has prior history of Echocardiogram examinations, most                 recent 01/07/2021. 01/07/21 Pericardiocentesis.  Sonographer:    Neomia Dear RDCS Referring Phys: [id number] CHRISTOPHER END IMPRESSIONS  1. Left ventricular ejection fraction, by estimation, is 60 to 65%. The left ventricle has normal function. The left ventricle has no regional wall motion abnormalities.  2. Right ventricular systolic function is normal. The right ventricular size is normal. There is normal pulmonary artery systolic pressure.  3. Post pericardiocentesis with no reaccumulation Persistent large left pleural effusion . The pericardial effusion is posterior to the left ventricle. Large pleural effusion in the left lateral region.  4. The mitral valve is  abnormal. Trivial mitral valve regurgitation. No evidence of mitral stenosis.  5. The aortic valve is tricuspid. There is mild calcification of the aortic valve. Aortic valve regurgitation is not visualized. Mild aortic valve sclerosis is present, with no evidence of aortic valve stenosis.  6. The inferior vena cava is normal in size with greater than 50% respiratory variability, suggesting right atrial pressure of 3 mmHg. FINDINGS  Left Ventricle: Left ventricular ejection fraction, by estimation, is 60 to 65%. The left ventricle has normal function. The left ventricle has no regional wall motion abnormalities. The left ventricular internal cavity size was normal in size. There is  no left ventricular hypertrophy. Right Ventricle: The right ventricular size is normal. No increase in right ventricular wall thickness. Right ventricular systolic function is normal. There is normal pulmonary artery systolic pressure. The tricuspid regurgitant velocity is 2.40 m/s, and  with an assumed right atrial pressure of 3 mmHg, the estimated right ventricular systolic pressure is 26.0 mmHg. Left Atrium: Left atrial size was normal in size. Right Atrium: Right atrial size was normal in size. Pericardium: Post pericardiocentesis with no reaccumulation Persistent large left pleural effusion. Trivial pericardial effusion is present. The pericardial effusion is posterior to the left ventricle. Mitral Valve: The mitral valve is abnormal. There is mild thickening of the mitral valve leaflet(s). There is mild calcification of the mitral valve leaflet(s). Trivial mitral valve regurgitation. No evidence of mitral valve stenosis. Tricuspid Valve: The tricuspid valve is not assessed. Tricuspid valve regurgitation is not demonstrated. No evidence of tricuspid stenosis. Aortic Valve: The aortic valve is tricuspid. There is mild calcification of the aortic valve. Aortic valve regurgitation is not visualized. Mild aortic valve sclerosis is  present, with no evidence of aortic valve stenosis. Pulmonic Valve: The pulmonic valve was not assessed. Pulmonic valve regurgitation is not visualized. No evidence of pulmonic stenosis. Aorta: The aortic root is normal in size and structure. Venous: The inferior vena cava is normal in size with greater than 50% respiratory variability, suggesting right atrial pressure of 3 mmHg. IAS/Shunts: No atrial level shunt detected by color flow Doppler. Additional Comments: There is a large pleural effusion in the left lateral region. LEFT ATRIUM  Index LA Vol (A4C): 35.3 ml 18.89 ml/m  MV E velocity: 88.70 cm/s  TRICUSPID VALVE                            TR Peak grad:   23.0 mmHg                            TR Vmax:        240.00 cm/s Charlton Haws MD Electronically signed by Charlton Haws MD Signature Date/Time: 01/08/2021/12:36:44 PM    Final    ECHOCARDIOGRAM LIMITED  Result Date: 01/07/2021    ECHOCARDIOGRAM LIMITED REPORT   Patient Name:   RHETA HEMMELGARN Date of Exam: 01/07/2021 Medical Rec #:  [id number]     Height:       62.0 in Accession #:    [id number]    Weight:       190.6 lb Date of Birth:  Apr 27, 1941     BSA:          1.873 m Patient Age:    79 years      BP:           142/69 mmHg Patient Gender: F             HR:           129 bpm. Exam Location:  Inpatient Procedure: Limited Echo Indications:    Pericardial effusion                 Pericardiocentesis  History:        Patient has prior history of Echocardiogram examinations, most                 recent 01/06/2021. Arrythmias:Atrial Fibrillation; Risk                 Factors:Sleep Apnea.  Sonographer:    Ross Ludwig RDCS (AE) Referring Phys: [id number] CHRISTOPHER END  Sonographer Comments: Technically difficult study due to poor echo windows. IMPRESSIONS  1. Left ventricular ejection fraction, by estimation, is 65 to 70%. The left ventricle has normal function. Small LV prior to procedure. Post procedure LV size is normal.  2. Large pericardial effusion. The  pericardial effusion is anterior to the right ventricle. Through study pericardial effusion moves from large to small. No evidence of RV collapse at end of study. Limited study for pericardiocentesis. Comparison(s): A prior study was performed on 01/06/21. Similar effusion prior to procedure. FINDINGS  Left Ventricle: Left ventricular ejection fraction, by estimation, is 65 to 70%. The left ventricle has normal function. The left ventricular internal cavity size was small. Pericardium: A large pericardial effusion is present. The pericardial effusion is anterior to the right ventricle. Riley Lam MD Electronically signed by Riley Lam MD Signature Date/Time: 01/07/2021/5:10:12 PM    Final    US THORACENTESIS ASP PLEURAL SPACE W/IMG GUIDE  Result Date: 01/06/2021 INDICATION: Patient with a history of permanent atrial fibrillation admitted for AFib with RVR and found to have bilateral pleural effusions as well as a pericardial effusion. Interventional radiology asked to perform a diagnostic and therapeutic thoracentesis. EXAM: ULTRASOUND GUIDED THORACENTESIS MEDICATIONS: 1% lidocaine 10 mL COMPLICATIONS: None immediate. PROCEDURE: An ultrasound guided thoracentesis was thoroughly discussed with the patient and questions answered. The benefits, risks, alternatives and complications were also discussed. The patient understands and wishes to proceed with the procedure. Written consent was obtained. Ultrasound was performed to  localize and mark an adequate pocket of fluid in the left chest. The area was then prepped and draped in the normal sterile fashion. 1% Lidocaine was used for local anesthesia. Under ultrasound guidance a 6 Fr Safe-T-Centesis catheter was introduced. Thoracentesis was performed. The catheter was removed and a dressing applied. FINDINGS: A total of approximately 950 mL of red/amber fluid was removed. Samples were sent to the laboratory as requested by the clinical team.  IMPRESSION: Successful ultrasound guided left thoracentesis yielding 950 mL of pleural fluid. Read by: Alwyn Ren, NP Electronically Signed   By: Gilmer Mor D.O.   On: 01/06/2021 13:39     LOS: 5 days   Huey Bienenstock, MD [facility]  01/08/2021, 12:45 PM

## 2021-01-08 NOTE — Progress Notes (Signed)
*  PRELIMINARY RESULTS* Echocardiogram 2D Echocardiogram has been performed.  Neomia Dear RDCS 01/08/2021, 12:16 PM

## 2021-01-09 ENCOUNTER — Inpatient Hospital Stay (HOSPITAL_COMMUNITY): Payer: Medicare Other

## 2021-01-09 DIAGNOSIS — Z7901 Long term (current) use of anticoagulants: Secondary | ICD-10-CM | POA: Diagnosis not present

## 2021-01-09 DIAGNOSIS — J90 Pleural effusion, not elsewhere classified: Secondary | ICD-10-CM | POA: Diagnosis not present

## 2021-01-09 DIAGNOSIS — I4891 Unspecified atrial fibrillation: Secondary | ICD-10-CM | POA: Diagnosis not present

## 2021-01-09 DIAGNOSIS — J189 Pneumonia, unspecified organism: Secondary | ICD-10-CM | POA: Diagnosis not present

## 2021-01-09 DIAGNOSIS — Z9889 Other specified postprocedural states: Secondary | ICD-10-CM | POA: Diagnosis not present

## 2021-01-09 DIAGNOSIS — I313 Pericardial effusion (noninflammatory): Secondary | ICD-10-CM | POA: Diagnosis not present

## 2021-01-09 LAB — BASIC METABOLIC PANEL
Anion gap: 6 (ref 5–15)
BUN: 35 mg/dL — ABNORMAL HIGH (ref 8–23)
CO2: 20 mmol/L — ABNORMAL LOW (ref 22–32)
Calcium: 8.4 mg/dL — ABNORMAL LOW (ref 8.9–10.3)
Chloride: 112 mmol/L — ABNORMAL HIGH (ref 98–111)
Creatinine, Ser: 1.09 mg/dL — ABNORMAL HIGH (ref 0.44–1.00)
GFR, Estimated: 51 mL/min — ABNORMAL LOW (ref >60)
Glucose, Bld: 95 mg/dL (ref 70–99)
Potassium: 4.5 mmol/L (ref 3.5–5.1)
Sodium: 138 mmol/L (ref 135–145)

## 2021-01-09 LAB — CBC
HCT: 37.4 % (ref 36.0–46.0)
Hemoglobin: 12.8 g/dL (ref 12.0–15.0)
MCH: 30.3 pg (ref 26.0–34.0)
MCHC: 34.2 g/dL (ref 30.0–36.0)
MCV: 88.6 fL (ref 80.0–100.0)
Platelets: 384 10*3/uL (ref 150–400)
RBC: 4.22 MIL/uL (ref 3.87–5.11)
RDW: 14.6 % (ref 11.5–15.5)
WBC: 6.5 10*3/uL (ref 4.0–10.5)
nRBC: 0 % (ref 0.0–0.2)

## 2021-01-09 LAB — PROTEIN, BODY FLUID (OTHER): Total Protein, Body Fluid Other: 3.9 g/dL

## 2021-01-09 LAB — MAGNESIUM: Magnesium: 2.2 mg/dL (ref 1.7–2.4)

## 2021-01-09 LAB — BODY FLUID CULTURE W GRAM STAIN: Culture: NO GROWTH

## 2021-01-09 LAB — GLUCOSE, CAPILLARY: Glucose-Capillary: 104 mg/dL — ABNORMAL HIGH (ref 70–99)

## 2021-01-09 LAB — HEPARIN LEVEL (UNFRACTIONATED): Heparin Unfractionated: 0.36 IU/mL (ref 0.30–0.70)

## 2021-01-09 LAB — GLUCOSE, BODY FLUID OTHER: Glucose, Body Fluid Other: 60 mg/dL

## 2021-01-09 LAB — LD, BODY FLUID (OTHER): LD, Body Fluid: 530 IU/L

## 2021-01-09 MED ORDER — PANTOPRAZOLE SODIUM 40 MG PO TBEC
40.0000 mg | DELAYED_RELEASE_TABLET | Freq: Every day | ORAL | Status: DC
  Administered 2021-01-10 – 2021-01-11 (×2): 40 mg via ORAL
  Filled 2021-01-09 (×2): qty 1

## 2021-01-09 MED ORDER — FUROSEMIDE 10 MG/ML IJ SOLN
40.0000 mg | Freq: Once | INTRAMUSCULAR | Status: AC
  Administered 2021-01-09: 40 mg via INTRAVENOUS
  Filled 2021-01-09: qty 4

## 2021-01-09 MED ORDER — APIXABAN 5 MG PO TABS
5.0000 mg | ORAL_TABLET | Freq: Two times a day (BID) | ORAL | Status: DC
  Administered 2021-01-09 – 2021-01-11 (×5): 5 mg via ORAL
  Filled 2021-01-09 (×5): qty 1

## 2021-01-09 MED ORDER — IBUPROFEN 600 MG PO TABS
600.0000 mg | ORAL_TABLET | Freq: Two times a day (BID) | ORAL | Status: DC
  Administered 2021-01-09 – 2021-01-11 (×4): 600 mg via ORAL
  Filled 2021-01-09: qty 1
  Filled 2021-01-09: qty 3
  Filled 2021-01-09: qty 1
  Filled 2021-01-09: qty 3

## 2021-01-09 NOTE — Progress Notes (Signed)
ANTICOAGULATION CONSULT NOTE   Pharmacy Consult for Heparin Indication: atrial fibrillation  Allergies  Allergen Reactions   Azithromycin Other (See Comments)    Pain in arm with IV infusion    Condrolite [Glucosamine-Chondroitin-Msm] Rash    Patient Measurements: Height: 5\' 2"  (157.5 cm) Weight: 85.6 kg (188 lb 11.4 oz) IBW/kg (Calculated) : 50.1 Heparin Dosing Weight: 69 kg  Vital Signs: Temp: 97.6 F (36.4 C) (08/16 2322) Temp Source: Oral (08/16 2322) BP: 118/62 (08/17 0500)  Labs: Recent Labs    01/07/21 0158 01/08/21 0053 01/09/21 0222 01/09/21 0407  HGB 12.8 12.4 12.8  --   HCT 38.1 37.4 37.4  --   PLT 319 309 384  --   HEPARINUNFRC 0.36 0.31 0.36  --   CREATININE 1.38* 1.37*  --  1.09*     Estimated Creatinine Clearance: 41.8 mL/min (A) (by C-G formula based on SCr of 1.09 mg/dL (H)).   Medical History: Past Medical History:  Diagnosis Date   A-fib [facility])    Allergy    Atrial fibrillation (HCC)    Macular degeneration    OSA (obstructive sleep apnea)    mild with total AHI 7.25/hr and severe during REM sleep at 35/hr now on CPAP at 6cm H2O    Assessment: Rebecca Short, Rebecca Short is an 80 YO female who presented to the ED w/ SOB and afib. She was diaphoretic, had chest pain, and increased palpitations. She takes Eliquis 5 mg BID at home and LD was 1900 8/10. Apixaban is on hold in anticipation for the possible need for pericardiocentesis or surgical window. Plan for echo on Monday, 01/07/21.  Heparin level 0.36 at goal on heparin drip 1350 uts/hr. Infusion was paused for drain removal at ~1400 on 8/16 and resumed and resumed at 1400. No s/sx bleeding or infusion complications. CBC stable.   Goal of Therapy:  Heparin level 0.3-0.7 units/ml Monitor platelets by anticoagulation protocol: Yes   Plan:  -Continue heparin at 1350 units/hr -Daily heparin level and CBC -Monitor s/sx bleeding -F/u plans to switch to PTA Eliquis  9/16, PharmD PGY1 Pharmacy  Resident 01/09/2021  6:53 AM  Please check [email or web address] for unit-specific pharmacy phone numbers.

## 2021-01-09 NOTE — Progress Notes (Signed)
Progress Note  Patient Name: Rebecca Short Date of Encounter: 01/09/2021  [facility] Cardiologist: [name], MD   Subjective   Feels better.  Appetite improved today.  Inpatient Medications    Scheduled Meds:  apixaban  5 mg Oral BID   chlorhexidine  15 mL Mouth Rinse BID   Chlorhexidine Gluconate Cloth  6 each Topical Daily   colchicine  0.6 mg Oral BID   guaiFENesin  600 mg Oral BID   ibuprofen  800 mg Oral TID   metoprolol tartrate  25 mg Oral Q6H   pantoprazole  40 mg Oral BID   sodium chloride flush  3 mL Intravenous Q12H   Continuous Infusions:  sodium chloride Stopped (01/08/21 1938)   PRN Meds: sodium chloride, acetaminophen **OR** acetaminophen, albuterol, guaiFENesin-dextromethorphan, HYDROcodone-acetaminophen, ondansetron **OR** ondansetron (ZOFRAN) IV, polyvinyl alcohol   Vital Signs    Vitals:   01/09/21 1000 01/09/21 1100 01/09/21 1134 01/09/21 1145  BP: (!) 81/52 (!) 151/98 138/77 98/75  Pulse:    97  Resp: (!) 24 (!) 25 (!) 22   Temp:   97.8 F (36.6 C)   TempSrc:   Axillary   SpO2: 97% 95% 95%   Weight:      Height:        Intake/Output Summary (Last 24 hours) at 01/09/2021 1219 Last data filed at 01/09/2021 1153 Gross per 24 hour  Intake 705.56 ml  Output 300 ml  Net 405.56 ml   Last 3 Weights 01/09/2021 01/08/2021 01/07/2021  Weight (lbs) 188 lb 11.4 oz 189 lb 9.5 oz 190 lb 9.6 oz  Weight (kg) 85.6 kg 86 kg 86.456 kg      Telemetry    Atrial fibrillation, controlled ventricular response - Personally Reviewed  ECG    Physical Exam   GEN: No acute distress.   Neck: No JVD Cardiac: Irregularly irregular, no murmurs, rubs, or gallops.  Respiratory: Decreased breath sounds bilaterally at the bases to auscultation bilaterally. GI: Soft, nontender, non-distended  MS: No edema; No deformity.  Site of pericardial drain clean dry and intact.  Drain was removed yesterday Neuro:  Nonfocal  Psych: Normal affect   Labs     High Sensitivity Troponin:   Recent Labs  Lab 12/23/20 1334 12/23/20 1513 01/03/21 0951 01/03/21 1338  TROPONINIHS 2 2 7 5       Chemistry Recent Labs  Lab 01/06/21 1359 01/07/21 0158 01/08/21 0053 01/09/21 0407  NA  --  136 137 138  K  --  4.0 3.3* 4.5  CL  --  106 108 112*  CO2  --  22 21* 20*  GLUCOSE  --  110* 115* 95  BUN  --  32* 32* 35*  CREATININE  --  1.38* 1.37* 1.09*  CALCIUM  --  8.1* 8.1* 8.4*  PROT 5.2*  --   --   --   GFRNONAA  --  39* 39* 51*  ANIONGAP  --  8 8 6      Hematology Recent Labs  Lab 01/07/21 0158 01/08/21 0053 01/09/21 0222  WBC 10.5 7.3 6.5  RBC 4.30 4.23 4.22  HGB 12.8 12.4 12.8  HCT 38.1 37.4 37.4  MCV 88.6 88.4 88.6  MCH 29.8 29.3 30.3  MCHC 33.6 33.2 34.2  RDW 14.0 13.9 14.6  PLT 319 309 384    BNP Recent Labs  Lab 01/03/21 0951  BNP 108.1*     DDimer No results for input(s): DDIMER in the last 168 hours.  Radiology    CARDIAC CATHETERIZATION  Result Date: 01/07/2021 Conclusions: Successful pericardiocentesis and pericardial drain placement via the subxiphoid approach, yielding 570 mL of serosanguinous fluid. Recommendations: Repeat limited echo tomorrow to ensure pericardial effusion is not reaccumulating. Maintain pericardial drain to vacuum drain placed in cath lab; record output every 4 hours. Obtain portable chest radiograph. Yvonne Kendall, MD [facility]  DG Chest Port 1 View  Result Date: 01/09/2021 CLINICAL DATA:  Pleural effusion. EXAM: PORTABLE CHEST 1 VIEW COMPARISON:  01/07/2021 FINDINGS: Stable cardiomediastinal contours. Moderate to large left pleural effusion and moderate right pleural effusion are unchanged compared with the previous exam. There is overlying atelectasis. Pulmonary vascular congestion is similar. IMPRESSION: 1. No change in bilateral pleural effusions, left greater than right. 2. Stable pulmonary vascular congestion. Electronically Signed   By: Signa Kell M.D.   On: 01/09/2021  10:38   DG CHEST PORT 1 VIEW  Result Date: 01/07/2021 CLINICAL DATA:  Status post pericardiocentesis. EXAM: PORTABLE CHEST 1 VIEW COMPARISON:  Radiograph yesterday.  CT 01/03/2021 FINDINGS: Enlargement of the cardiopericardial silhouette persists, however slight decreased globular component likely sequela of pericardiocentesis. There is a presumed pericardial drain in place. Persistent bilateral pleural effusions and atelectasis. No pneumothorax. IMPRESSION: 1. Persistent enlargement of the cardiopericardial silhouette, however slight decreased globular appearance likely related to pericardiocentesis. Presumed pericardial drain in place. 2. Persistent bilateral pleural effusions and atelectasis. Electronically Signed   By: Narda Rutherford M.D.   On: 01/07/2021 18:56   ECHOCARDIOGRAM LIMITED  Result Date: 01/08/2021    ECHOCARDIOGRAM LIMITED REPORT   Patient Name:   Rebecca Short Date of Exam: 01/08/2021 Medical Rec #:  [id number]     Height:       62.0 in Accession #:    [id number]    Weight:       189.6 lb Date of Birth:  08/26/1940     BSA:          1.869 m Patient Age:    79 years      BP:           140/67 mmHg Patient Gender: F             HR:           85 bpm. Exam Location:  Inpatient Procedure: Limited Echo and Limited Color Doppler Indications:    Pericardial effusion follow up  History:        Patient has prior history of Echocardiogram examinations, most                 recent 01/07/2021. 01/07/21 Pericardiocentesis.  Sonographer:    Neomia Dear RDCS Referring Phys: [id number] IMPRESSIONS  1. Left ventricular ejection fraction, by estimation, is 60 to 65%. The left ventricle has normal function. The left ventricle has no regional wall motion abnormalities.  2. Right ventricular systolic function is normal. The right ventricular size is normal. There is normal pulmonary artery systolic pressure.  3. Post pericardiocentesis with no reaccumulation Persistent large left pleural effusion . The  pericardial effusion is posterior to the left ventricle. Large pleural effusion in the left lateral region.  4. The mitral valve is abnormal. Trivial mitral valve regurgitation. No evidence of mitral stenosis.  5. The aortic valve is tricuspid. There is mild calcification of the aortic valve. Aortic valve regurgitation is not visualized. Mild aortic valve sclerosis is present, with no evidence of aortic valve stenosis.  6. The inferior vena cava is normal in size with  greater than 50% respiratory variability, suggesting right atrial pressure of 3 mmHg. FINDINGS  Left Ventricle: Left ventricular ejection fraction, by estimation, is 60 to 65%. The left ventricle has normal function. The left ventricle has no regional wall motion abnormalities. The left ventricular internal cavity size was normal in size. There is  no left ventricular hypertrophy. Right Ventricle: The right ventricular size is normal. No increase in right ventricular wall thickness. Right ventricular systolic function is normal. There is normal pulmonary artery systolic pressure. The tricuspid regurgitant velocity is 2.40 m/s, and  with an assumed right atrial pressure of 3 mmHg, the estimated right ventricular systolic pressure is 26.0 mmHg. Left Atrium: Left atrial size was normal in size. Right Atrium: Right atrial size was normal in size. Pericardium: Post pericardiocentesis with no reaccumulation Persistent large left pleural effusion. Trivial pericardial effusion is present. The pericardial effusion is posterior to the left ventricle. Mitral Valve: The mitral valve is abnormal. There is mild thickening of the mitral valve leaflet(s). There is mild calcification of the mitral valve leaflet(s). Trivial mitral valve regurgitation. No evidence of mitral valve stenosis. Tricuspid Valve: The tricuspid valve is not assessed. Tricuspid valve regurgitation is not demonstrated. No evidence of tricuspid stenosis. Aortic Valve: The aortic valve is tricuspid.  There is mild calcification of the aortic valve. Aortic valve regurgitation is not visualized. Mild aortic valve sclerosis is present, with no evidence of aortic valve stenosis. Pulmonic Valve: The pulmonic valve was not assessed. Pulmonic valve regurgitation is not visualized. No evidence of pulmonic stenosis. Aorta: The aortic root is normal in size and structure. Venous: The inferior vena cava is normal in size with greater than 50% respiratory variability, suggesting right atrial pressure of 3 mmHg. IAS/Shunts: No atrial level shunt detected by color flow Doppler. Additional Comments: There is a large pleural effusion in the left lateral region. LEFT ATRIUM           Index LA Vol (A4C): 35.3 ml 18.89 ml/m  MV E velocity: 88.70 cm/s  TRICUSPID VALVE                            TR Peak grad:   23.0 mmHg                            TR Vmax:        240.00 cm/s Charlton Haws MD Electronically signed by Charlton Haws MD Signature Date/Time: 01/08/2021/12:36:44 PM    Final    ECHOCARDIOGRAM LIMITED  Result Date: 01/07/2021    ECHOCARDIOGRAM LIMITED REPORT   Patient Name:   LESIA ATTKISSON Date of Exam: 01/07/2021 Medical Rec #:  [id number]     Height:       62.0 in Accession #:    [id number]    Weight:       190.6 lb Date of Birth:  [birth year]     BSA:          1.873 m Patient Age:    79 years      BP:           142/69 mmHg Patient Gender: F             HR:           129 bpm. Exam Location:  Inpatient Procedure: Limited Echo Indications:    Pericardial effusion  Pericardiocentesis  History:        Patient has prior history of Echocardiogram examinations, most                 recent 01/06/2021. Arrythmias:Atrial Fibrillation; Risk                 Factors:Sleep Apnea.  Sonographer:    Ross Ludwig RDCS (AE) Referring Phys: [id number] CHRI[address] END  Sonographer Comments: Technically difficult study due to poor echo windows. IMPRESSIONS  1. Left ventricular ejection fraction, by estimation, is 65 to 70%. The left  ventricle has normal function. Small LV prior to procedure. Post procedure LV size is normal.  2. Large pericardial effusion. The pericardial effusion is anterior to the right ventricle. Through study pericardial effusion moves from large to small. No evidence of RV collapse at end of study. Limited study for pericardiocentesis. Comparison(s): A prior study was performed on 01/06/21. Similar effusion prior to procedure. FINDINGS  Left Ventricle: Left ventricular ejection fraction, by estimation, is 65 to 70%. The left ventricle has normal function. The left ventricular internal cavity size was small. Pericardium: A large pericardial effusion is present. The pericardial effusion is anterior to the right ventricle. Riley Lam MD Electronically signed by Riley Lam MD Signature Date/Time: 01/07/2021/5:10:12 PM    Final     Cardiac Studies     Patient Profile     80 y.o. female pneumonia, AFib  Assessment & Plan    Atrial fibrillation: Rate controlled.  Continue metoprolol at current dose.  It is higher than her usual dose.  Chest x-ray shows significant bilateral pleural effusions, but left side significantly improved after thoracentesis.  No plans for further thoracentesis at this point.  Once that is certain, will stop heparin and restart Eliquis for anticoagulation in the setting of atrial fibrillation.  We will give a dose of IV Lasix.  Since her blood pressure is better, we have stopped digoxin.  Will use more effective rate control medicines for her as we encourage more vigorous activity.  Decrease ibuprofen to 600 mg BID to reduce bleeding risk and preserve renal function.       For questions or updates, please contact [facility] Please consult [email or web address] for contact info under        Signed, [name], MD  01/09/2021, 12:19 PM

## 2021-01-09 NOTE — Progress Notes (Signed)
PROGRESS NOTEYTZEL GUBLERM Short  MWU:[id number] DOB: 29-Jul-1940 DOA: 01/03/2021 PCP: Daisy Floro, MD   Chief Complaint  Patient presents with   Atrial Fibrillation    Brief Narrative:  80 year old female with history of permanent A. fib on Eliquis, morbid obesity BMI 34 with OSA on CPAP presents with cough, shortness of breath progressively worse since leaving the hospital but he was hospitalized 7/31-8/2 for A. fib with RVR, hypotension.  Work-up in the ED showed A. fib with RVR, pneumonia and was admitted for further work-up, her work-up was significant for pericardial effusion, left pleural effusion, status postthoracentesis 8/14, work-up significant for exudate, and pericardial effusion without evidence of tamponade, status post pericardial effusion 8/15 with 570 cc fluid drained.  Subjective:  Patient reports she is feeling a lot better today, dyspnea has improved, she has a minimal cough today.     Assessment & Plan:  Large and growing in size pericardial effusion with evidence of increasing right atrial pressure without tamponade:  -No evidence of lung mass on the CT, has had effusion since 7/31st.  -On colchicine and NSAIDs. -Management per cardiology, status post pericardiocentesis 8/15 with 570 cc drained.  Drain has removed, follow on cytology -Transfer to telemetry floor as discussed with cardiology.  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  And pericardial effusion. . -Still with some evidence of volume overload, continue with IV diuresis, management per cardiology.  Net IO Since Admission: 3,071.4 mL [01/09/21 1107]   Intake/Output Summary (Last 24 hours) at 01/09/2021 1107 Last data filed at 01/09/2021 0900 Gross per 24 hour  Intake 676.71 ml  Output 300 ml  Net 376.71 ml   Filed Weights   01/07/21 0435 01/08/21 0600 01/09/21 0500  Weight: 86.5 kg 86 kg 85.6 kg     Permanent atrial fibrillation with RVR, -  currently rate controlled on metoprolol 25 mg every 6 hours. - digoxin was held 8/16 level 1.2. -   Anticoagulation w/ heparin, now back on Eliquis.  Community acquired pneumonia / left pleural effusion Presented with leukocytosis Pro-Cal 0.11.  Received total 5 days of antibiotics. -Significant left pleural effusion status postthoracentesis of 950 cc on 8/14.  Exudative given LDL ratio is >0.6, so it is most likely parapneumonic versus inflammatory process(may be same process contributing to her pericardial effusion) -cytology  significant for mesothelial/inflammatory cells, no evidence of malignancy -No organisms seen on gram stain, cultures remain with NO growth. -repeat CXR Today showing stable pleural effusion, no evidence of respiratory distress, will monitor closely on  diuresis, will hold on repeat thoracentesis, she was encouraged to ambulate, use incentive spirometry and flutter valve.  Hyponatremia: Monitor  Hypokalemia: repleted   OSA on CPAP: Continue same  Morbid Obesity w/ BMI 34.9: Will benefit with weight loss, healthy lifestyle and PCP follow-up    Recent Labs  Lab 01/03/21 1737 01/03/21 2122 01/04/21 0228 01/05/21 0343 01/06/21 0220 01/07/21 0158 01/08/21 0053 01/09/21 0222  WBC  --   --    < > 9.5 17.3* 10.5 7.3 6.5  LATICACIDVEN 1.3 1.2  --   --   --   --   --   --   PROCALCITON 0.11  --   --   --   --   --   --   --    < > = values in this interval not displayed.    Diet Order  Diet Carb Modified Fluid consistency: Thin; Room service appropriate? Yes  Diet effective now                   Patient's Body mass index is 34.52 kg/m.  DVT prophylaxis: SCD.  On heparin GTT>> Eliquis Code Status:   Code Status: Full Code  Family Communication: discussed with patient, none at bedside. Status is: Inpatient Remains inpatient appropriate because:Ongoing diagnostic testing needed not appropriate  for outpatient work up and Inpatient level of care appropriate due to severity of illness  Dispo: The patient is from: Home              Anticipated d/c is to: Home              Patient currently is not medically stable to d/c.   Difficult to place patient No Unresulted Labs (From admission, onward)     Start     Ordered   01/07/21 1604  LD, Body Fluid (other)  RELEASE UPON ORDERING,   TIMED       Comments: Specimen A: Pre-op diagnosis: pericardiocentesis    01/07/21 1604   01/07/21 1604  Gram Stain/Body Fluid Culture  RELEASE UPON ORDERING,   TIMED       Comments: Specimen A: Pre-op diagnosis: pericardiocentesis    01/07/21 1604   01/07/21 1604  PH, Body Fluid  RELEASE UPON ORDERING,   TIMED       Comments: Specimen A: Pre-op diagnosis: pericardiocentesis    01/07/21 1604   01/07/21 1604  Glucose, Body Fluid Other  RELEASE UPON ORDERING,   TIMED       Comments: Specimen A: Pre-op diagnosis: pericardiocentesis    01/07/21 1604   01/07/21 1604  Protein, body fluid (other)  RELEASE UPON ORDERING,   TIMED       Comments: Specimen A: Pre-op diagnosis: pericardiocentesis    01/07/21 1604   01/06/21 0837  Basic metabolic panel  Daily,   R      01/06/21 0836   01/06/21 0837  Magnesium  Daily,   R      01/06/21 0836   01/05/21 0500  Heparin level (unfractionated)  Daily,   R      01/03/21 1659   01/05/21 0500  CBC  Daily,   R      01/04/21 1438           Medications reviewed:  Scheduled Meds:  chlorhexidine  15 mL Mouth Rinse BID   Chlorhexidine Gluconate Cloth  6 each Topical Daily   colchicine  0.6 mg Oral BID   guaiFENesin  600 mg Oral BID   ibuprofen  800 mg Oral TID   metoprolol tartrate  25 mg Oral Q6H   pantoprazole  40 mg Oral BID   sodium chloride flush  3 mL Intravenous Q12H   Continuous Infusions:  sodium chloride Stopped (01/08/21 1938)   heparin 1,350 Units/hr (01/09/21 0900)   Consultants: - cardiology  Procedures: -Thoracentesis  8/14. -Pericardiocentesis 8/15  Antimicrobials: Anti-infectives (From admission, onward)    Start     Dose/Rate Route Frequency Ordered Stop   01/04/21 1200  cefTRIAXone (ROCEPHIN) 1 g in sodium chloride 0.9 % 100 mL IVPB  Status:  Discontinued        1 g 200 mL/hr over 30 Minutes Intravenous  Once 01/03/21 1636 01/03/21 1713   01/04/21 1000  cefTRIAXone (ROCEPHIN) 1 g in sodium chloride 0.9 % 100 mL IVPB        1  g 200 mL/hr over 30 Minutes Intravenous Every 24 hours 01/03/21 1713 01/08/21 0800   01/04/21 1000  doxycycline (VIBRA-TABS) tablet 100 mg        100 mg Oral Every 12 hours 01/04/21 0738 01/07/21 2027   01/03/21 1215  cefTRIAXone (ROCEPHIN) 1 g in sodium chloride 0.9 % 100 mL IVPB        1 g 200 mL/hr over 30 Minutes Intravenous  Once 01/03/21 1209 01/03/21 1355   01/03/21 1215  azithromycin (ZITHROMAX) 500 mg in sodium chloride 0.9 % 250 mL IVPB        500 mg 250 mL/hr over 60 Minutes Intravenous  Once 01/03/21 1209 01/03/21 1502      Culture/Microbiology    Component Value Date/Time   SDES FLUID PERICARDIAL 01/07/2021 1604   SPECREQUEST NONE 01/07/2021 1604   CULT  01/07/2021 1604    NO GROWTH 2 DAYS Performed at [facility], [address], [place], [place] [address]    REPTSTATUS PENDING 01/07/2021 1604    Other culture-see note  Objective: Vitals: Today's Vitals   01/09/21 0700 01/09/21 0800 01/09/21 0815 01/09/21 0900  BP: (!) 141/84 124/74 124/62 131/73  Pulse:      Resp: (!) 21 (!) 25 (!) 25 (!) 28  Temp:   98.1 F (36.7 C)   TempSrc:   Oral   SpO2: 95%  95%   Weight:      Height:      PainSc:  0-No pain      Intake/Output Summary (Last 24 hours) at 01/09/2021 1107 Last data filed at 01/09/2021 0900 Gross per 24 hour  Intake 676.71 ml  Output 300 ml  Net 376.71 ml   Filed Weights   01/07/21 0435 01/08/21 0600 01/09/21 0500  Weight: 86.5 kg 86 kg 85.6 kg   Weight change: -0.4 kg  Intake/Output from previous day: 08/16 0701 -  08/17 0700 In: 763.6 [P.O.:360; I.V.:303.1; IV Piggyback:100.4] Out: 800 [Urine:300; Stool:500] Intake/Output this shift: Total I/O In: 387.1 [P.O.:360; I.V.:27.1] Out: -  Filed Weights   01/07/21 0435 01/08/21 0600 01/09/21 0500  Weight: 86.5 kg 86 kg 85.6 kg   Examination:  Awake Alert, Oriented X 3, No new F.N deficits, Normal affect Symmetrical Chest wall movement, diminished air entry at the bases, no respiratory distress or increased work of breathing. RRR,No Gallops,Rubs or new Murmurs, No Parasternal Heave +ve B.Sounds, Abd Soft, No tenderness, No rebound - guarding or rigidity. No Cyanosis, Clubbing or edema, No new Rash or bruise       CBC: Recent Labs  Lab 01/03/21 0951 01/04/21 0228 01/05/21 0343 01/06/21 0220 01/07/21 0158 01/08/21 0053 01/09/21 0222  WBC 12.6*   < > 9.5 17.3* 10.5 7.3 6.5  NEUTROABS 11.1*  --   --   --   --   --   --   HGB 14.2   < > 12.6 13.1 12.8 12.4 12.8  HCT 42.9   < > 38.5 39.3 38.1 37.4 37.4  MCV 89.7   < > 89.5 89.5 88.6 88.4 88.6  PLT 293   < > 252 308 319 309 384   < > = values in this interval not displayed.   Basic Metabolic Panel: Recent Labs  Lab 01/03/21 0951 01/04/21 0228 01/05/21 0343 01/06/21 0220 01/07/21 0158 01/08/21 0053 01/09/21 0407  NA 130*   < > 132* 134* 136 137 138  K 4.3   < > 3.4* 4.0 4.0 3.3* 4.5  CL 96*   < >  98 103 106 108 112*  CO2 25   < > 25 22 22  21* 20*  GLUCOSE 121*   < > 122* 136* 110* 115* 95  BUN 12   < > 25* 24* 32* 32* 35*  CREATININE 0.85   < > 0.80 1.03* 1.38* 1.37* 1.09*  CALCIUM 8.6*   < > 8.5* 8.2* 8.1* 8.1* 8.4*  MG 2.1  --   --  2.0 1.9 1.9 2.2   < > = values in this interval not displayed.   GFR: Estimated Creatinine Clearance: 41.8 mL/min (A) (by C-G formula based on SCr of 1.09 mg/dL (H)). Liver Function Tests: Recent Labs  Lab 01/06/21 1359  PROT 5.2*   No results for input(s): LIPASE, AMYLASE in the last 168 hours. No results for input(s): AMMONIA in the last  168 hours. Coagulation Profile: Recent Labs  Lab 01/03/21 0951  INR 1.5*   Cardiac Enzymes: No results for input(s): CKTOTAL, CKMB, CKMBINDEX, TROPONINI in the last 168 hours. BNP (last 3 results) No results for input(s): PROBNP in the last 8760 hours. HbA1C: No results for input(s): HGBA1C in the last 72 hours. CBG: Recent Labs  Lab 01/07/21 2138 01/08/21 0812 01/08/21 1135 01/08/21 1609 01/09/21 0833  GLUCAP 136* 81 140* 85 104*   Lipid Profile: No results for input(s): CHOL, HDL, LDLCALC, TRIG, CHOLHDL, LDLDIRECT in the last 72 hours. Thyroid Function Tests: No results for input(s): TSH, T4TOTAL, FREET4, T3FREE, THYROIDAB in the last 72 hours. Anemia Panel: No results for input(s): VITAMINB12, FOLATE, FERRITIN, TIBC, IRON, RETICCTPCT in the last 72 hours. Sepsis Labs: Recent Labs  Lab 01/03/21 1737 01/03/21 2122  PROCALCITON 0.11  --   LATICACIDVEN 1.3 1.2    Recent Results (from the past 240 hour(s))  Resp Panel by RT-PCR (Flu A&B, Covid) Nasopharyngeal Swab     Status: None   Collection Time: 01/03/21 10:14 AM   Specimen: Nasopharyngeal Swab; Nasopharyngeal(NP) swabs in vial transport medium  Result Value Ref Range Status   SARS Coronavirus 2 by RT PCR NEGATIVE NEGATIVE Final    Comment: (NOTE) SARS-CoV-2 target nucleic acids are NOT DETECTED.  The SARS-CoV-2 RNA is generally detectable in upper respiratory specimens during the acute phase of infection. The lowest concentration of SARS-CoV-2 viral copies this assay can detect is 138 copies/mL. A negative result does not preclude SARS-Cov-2 infection and should not be used as the sole basis for treatment or other patient management decisions. A negative result may occur with  improper specimen collection/handling, submission of specimen other than nasopharyngeal swab, presence of viral mutation(s) within the areas targeted by this assay, and inadequate number of viral copies(<138 copies/mL). A negative  result must be combined with clinical observations, patient history, and epidemiological information. The expected result is Negative.  Fact Sheet for Patients:  BloggerCourse.com  Fact Sheet for Healthcare Providers:  SeriousBroker.it  This test is no t yet approved or cleared by the [place] FDA and  has been authorized for detection and/or diagnosis of SARS-CoV-2 by FDA under an Emergency Use Authorization (EUA). This EUA will remain  in effect (meaning this test can be used) for the duration of the COVID-19 declaration under Section 564(b)(1) of the Act, 21 U.S.C.section 360bbb-3(b)(1), unless the authorization is terminated  or revoked sooner.       Influenza A by PCR NEGATIVE NEGATIVE Final   Influenza B by PCR NEGATIVE NEGATIVE Final    Comment: (NOTE) The Xpert Xpress SARS-CoV-2/FLU/RSV plus assay is intended as an aid  in the diagnosis of influenza from Nasopharyngeal swab specimens and should not be used as a sole basis for treatment. Nasal washings and aspirates are unacceptable for Xpert Xpress SARS-CoV-2/FLU/RSV testing.  Fact Sheet for Patients: BloggerCourse.com  Fact Sheet for Healthcare Providers: SeriousBroker.it  This test is not yet approved or cleared by the [place] FDA and has been authorized for detection and/or diagnosis of SARS-CoV-2 by FDA under an Emergency Use Authorization (EUA). This EUA will remain in effect (meaning this test can be used) for the duration of the COVID-19 declaration under Section 564(b)(1) of the Act, 21 U.S.C. section 360bbb-3(b)(1), unless the authorization is terminated or revoked.  Performed at [facility], [address] [place], [place] [address]   Body fluid culture w Gram Stain     Status: None   Collection Time: 01/06/21  1:07 PM   Specimen: Lung, Left; Pleural Fluid  Result Value Ref Range Status    Specimen Description PLEURAL FLUID  Final   Special Requests LUNG RIGHT  Final   Gram Stain   Final    RARE WBC PRESENT,BOTH PMN AND MONONUCLEAR NO ORGANISMS SEEN    Culture   Final    NO GROWTH Performed at [facility], [address] [place], [place] [address]    Report Status 01/09/2021 FINAL  Final  Body fluid culture w Gram Stain     Status: None (Preliminary result)   Collection Time: 01/07/21  4:04 PM   Specimen: PATH Cytology Misc. fluid; Body Fluid  Result Value Ref Range Status   Specimen Description FLUID PERICARDIAL  Final   Special Requests NONE  Final   Gram Stain   Final    NO SQUAMOUS EPITHELIAL CELLS SEEN ABUNDANT WBC PRESENT, PREDOMINANTLY MONONUCLEAR NO ORGANISMS SEEN    Culture   Final    NO GROWTH 2 DAYS Performed at [facility], [address], [place], [place] [address]    Report Status PENDING  Incomplete  MRSA Next Gen by PCR, Nasal     Status: None   Collection Time: 01/07/21  6:13 PM   Specimen: Nasal Mucosa; Nasal Swab  Result Value Ref Range Status   MRSA by PCR Next Gen NOT DETECTED NOT DETECTED Final    Comment: (NOTE) The GeneXpert MRSA Assay (FDA approved for NASAL specimens only), is one component of a comprehensive MRSA colonization surveillance program. It is not intended to diagnose MRSA infection nor to guide or monitor treatment for MRSA infections. Test performance is not FDA approved in patients less than 32 years old. Performed at [facility], [address] [place], [place] [address]      Radiology Studies: CARDIAC CATHETERIZATION  Result Date: 01/07/2021 Conclusions: Successful pericardiocentesis and pericardial drain placement via the subxiphoid approach, yielding 570 mL of serosanguinous fluid. Recommendations: Repeat limited echo tomorrow to ensure pericardial effusion is not reaccumulating. Maintain pericardial drain to vacuum drain placed in cath lab; record output every 4 hours. Obtain portable  chest radiograph. Yvonne Kendall, MD [facility]  DG Chest Port 1 View  Result Date: 01/09/2021 CLINICAL DATA:  Pleural effusion. EXAM: PORTABLE CHEST 1 VIEW COMPARISON:  01/07/2021 FINDINGS: Stable cardiomediastinal contours. Moderate to large left pleural effusion and moderate right pleural effusion are unchanged compared with the previous exam. There is overlying atelectasis. Pulmonary vascular congestion is similar. IMPRESSION: 1. No change in bilateral pleural effusions, left greater than right. 2. Stable pulmonary vascular congestion. Electronically Signed   By: Ladona Ridgel  Bradly Chris M.D.   On: 01/09/2021 10:38   DG CHEST PORT 1 VIEW  Result Date: 01/07/2021 CLINICAL DATA:  Status post pericardiocentesis. EXAM: PORTABLE CHEST 1 VIEW COMPARISON:  Radiograph yesterday.  CT 01/03/2021 FINDINGS: Enlargement of the cardiopericardial silhouette persists, however slight decreased globular component likely sequela of pericardiocentesis. There is a presumed pericardial drain in place. Persistent bilateral pleural effusions and atelectasis. No pneumothorax. IMPRESSION: 1. Persistent enlargement of the cardiopericardial silhouette, however slight decreased globular appearance likely related to pericardiocentesis. Presumed pericardial drain in place. 2. Persistent bilateral pleural effusions and atelectasis. Electronically Signed   By: Narda Rutherford M.D.   On: 01/07/2021 18:56   ECHOCARDIOGRAM LIMITED  Result Date: 01/08/2021    ECHOCARDIOGRAM LIMITED REPORT   Patient Name:   Rebecca Short Date of Exam: 01/08/2021 Medical Rec #:  [id number]     Height:       62.0 in Accession #:    [id number]    Weight:       189.6 lb Date of Birth:  08/02/1940     BSA:          1.869 m Patient Age:    79 years      BP:           140/67 mmHg Patient Gender: F             HR:           85 bpm. Exam Location:  Inpatient Procedure: Limited Echo and Limited Color Doppler Indications:    Pericardial effusion follow up  History:         Patient has prior history of Echocardiogram examinations, most                 recent 01/07/2021. 01/07/21 Pericardiocentesis.  Sonographer:    Neomia Dear RDCS Referring Phys: [id number] IMPRESSIONS  1. Left ventricular ejection fraction, by estimation, is 60 to 65%. The left ventricle has normal function. The left ventricle has no regional wall motion abnormalities.  2. Right ventricular systolic function is normal. The right ventricular size is normal. There is normal pulmonary artery systolic pressure.  3. Post pericardiocentesis with no reaccumulation Persistent large left pleural effusion . The pericardial effusion is posterior to the left ventricle. Large pleural effusion in the left lateral region.  4. The mitral valve is abnormal. Trivial mitral valve regurgitation. No evidence of mitral stenosis.  5. The aortic valve is tricuspid. There is mild calcification of the aortic valve. Aortic valve regurgitation is not visualized. Mild aortic valve sclerosis is present, with no evidence of aortic valve stenosis.  6. The inferior vena cava is normal in size with greater than 50% respiratory variability, suggesting right atrial pressure of 3 mmHg. FINDINGS  Left Ventricle: Left ventricular ejection fraction, by estimation, is 60 to 65%. The left ventricle has normal function. The left ventricle has no regional wall motion abnormalities. The left ventricular internal cavity size was normal in size. There is  no left ventricular hypertrophy. Right Ventricle: The right ventricular size is normal. No increase in right ventricular wall thickness. Right ventricular systolic function is normal. There is normal pulmonary artery systolic pressure. The tricuspid regurgitant velocity is 2.40 m/s, and  with an assumed right atrial pressure of 3 mmHg, the estimated right ventricular systolic pressure is 26.0 mmHg. Left Atrium: Left atrial size was normal in size. Right Atrium: Right atrial size was normal in size.  Pericardium: Post pericardiocentesis with no reaccumulation Persistent large left pleural  effusion. Trivial pericardial effusion is present. The pericardial effusion is posterior to the left ventricle. Mitral Valve: The mitral valve is abnormal. There is mild thickening of the mitral valve leaflet(s). There is mild calcification of the mitral valve leaflet(s). Trivial mitral valve regurgitation. No evidence of mitral valve stenosis. Tricuspid Valve: The tricuspid valve is not assessed. Tricuspid valve regurgitation is not demonstrated. No evidence of tricuspid stenosis. Aortic Valve: The aortic valve is tricuspid. There is mild calcification of the aortic valve. Aortic valve regurgitation is not visualized. Mild aortic valve sclerosis is present, with no evidence of aortic valve stenosis. Pulmonic Valve: The pulmonic valve was not assessed. Pulmonic valve regurgitation is not visualized. No evidence of pulmonic stenosis. Aorta: The aortic root is normal in size and structure. Venous: The inferior vena cava is normal in size with greater than 50% respiratory variability, suggesting right atrial pressure of 3 mmHg. IAS/Shunts: No atrial level shunt detected by color flow Doppler. Additional Comments: There is a large pleural effusion in the left lateral region. LEFT ATRIUM           Index LA Vol (A4C): 35.3 ml 18.89 ml/m  MV E velocity: 88.70 cm/s  TRICUSPID VALVE                            TR Peak grad:   23.0 mmHg                            TR Vmax:        240.00 cm/s Charlton Haws MD Electronically signed by Charlton Haws MD Signature Date/Time: 01/08/2021/12:36:44 PM    Final    ECHOCARDIOGRAM LIMITED  Result Date: 01/07/2021    ECHOCARDIOGRAM LIMITED REPORT   Patient Name:   Rebecca Short Date of Exam: 01/07/2021 Medical Rec #:  [id number]     Height:       62.0 in Accession #:    [id number]    Weight:       190.6 lb Date of Birth:  [birth year]     BSA:          1.873 m Patient Age:    79 years      BP:            142/69 mmHg Patient Gender: F             HR:           129 bpm. Exam Location:  Inpatient Procedure: Limited Echo Indications:    Pericardial effusion                 Pericardiocentesis  History:        Patient has prior history of Echocardiogram examinations, most                 recent 01/06/2021. Arrythmias:Atrial Fibrillation; Risk                 Factors:Sleep Apnea.  Sonographer:    Ross Ludwig RDCS (AE) Referring Phys: [id number] [address] END  Sonographer Comments: Technically difficult study due to poor echo windows. IMPRESSIONS  1. Left ventricular ejection fraction, by estimation, is 65 to 70%. The left ventricle has normal function. Small LV prior to procedure. Post procedure LV size is normal.  2. Large pericardial effusion. The pericardial effusion is anterior to the right ventricle. Through study pericardial effusion moves from large to small. No evidence  of RV collapse at end of study. Limited study for pericardiocentesis. Comparison(s): A prior study was performed on 01/06/21. Similar effusion prior to procedure. FINDINGS  Left Ventricle: Left ventricular ejection fraction, by estimation, is 65 to 70%. The left ventricle has normal function. The left ventricular internal cavity size was small. Pericardium: A large pericardial effusion is present. The pericardial effusion is anterior to the right ventricle. Riley Lam MD Electronically signed by Riley Lam MD Signature Date/Time: 01/07/2021/5:10:12 PM    Final      LOS: 6 days   Huey Bienenstock, MD [facility]  01/09/2021, 11:07 AM

## 2021-01-10 DIAGNOSIS — J189 Pneumonia, unspecified organism: Secondary | ICD-10-CM | POA: Diagnosis not present

## 2021-01-10 DIAGNOSIS — I4891 Unspecified atrial fibrillation: Secondary | ICD-10-CM | POA: Diagnosis not present

## 2021-01-10 DIAGNOSIS — Z7901 Long term (current) use of anticoagulants: Secondary | ICD-10-CM | POA: Diagnosis not present

## 2021-01-10 DIAGNOSIS — G4733 Obstructive sleep apnea (adult) (pediatric): Secondary | ICD-10-CM | POA: Diagnosis not present

## 2021-01-10 LAB — CBC
HCT: 39.4 % (ref 36.0–46.0)
Hemoglobin: 12.8 g/dL (ref 12.0–15.0)
MCH: 29.1 pg (ref 26.0–34.0)
MCHC: 32.5 g/dL (ref 30.0–36.0)
MCV: 89.5 fL (ref 80.0–100.0)
Platelets: 333 10*3/uL (ref 150–400)
RBC: 4.4 MIL/uL (ref 3.87–5.11)
RDW: 14.3 % (ref 11.5–15.5)
WBC: 5.3 10*3/uL (ref 4.0–10.5)
nRBC: 0 % (ref 0.0–0.2)

## 2021-01-10 LAB — PH, BODY FLUID: pH, Body Fluid: 7.3

## 2021-01-10 LAB — BASIC METABOLIC PANEL
Anion gap: 8 (ref 5–15)
BUN: 35 mg/dL — ABNORMAL HIGH (ref 8–23)
CO2: 23 mmol/L (ref 22–32)
Calcium: 8.4 mg/dL — ABNORMAL LOW (ref 8.9–10.3)
Chloride: 109 mmol/L (ref 98–111)
Creatinine, Ser: 1.36 mg/dL — ABNORMAL HIGH (ref 0.44–1.00)
GFR, Estimated: 39 mL/min — ABNORMAL LOW (ref >60)
Glucose, Bld: 114 mg/dL — ABNORMAL HIGH (ref 70–99)
Potassium: 3.6 mmol/L (ref 3.5–5.1)
Sodium: 140 mmol/L (ref 135–145)

## 2021-01-10 LAB — MAGNESIUM: Magnesium: 1.8 mg/dL (ref 1.7–2.4)

## 2021-01-10 MED ORDER — MAGNESIUM SULFATE 2 GM/50ML IV SOLN
2.0000 g | Freq: Once | INTRAVENOUS | Status: AC
  Administered 2021-01-10: 2 g via INTRAVENOUS
  Filled 2021-01-10: qty 50

## 2021-01-10 MED ORDER — FUROSEMIDE 10 MG/ML IJ SOLN
40.0000 mg | Freq: Once | INTRAMUSCULAR | Status: AC
  Administered 2021-01-10: 40 mg via INTRAVENOUS
  Filled 2021-01-10: qty 4

## 2021-01-10 MED ORDER — POTASSIUM CHLORIDE 20 MEQ PO PACK
40.0000 meq | PACK | Freq: Once | ORAL | Status: AC
  Administered 2021-01-10: 40 meq via ORAL
  Filled 2021-01-10: qty 2

## 2021-01-10 MED ORDER — POTASSIUM CHLORIDE 20 MEQ PO PACK: 40.0000 meq | PACK | Freq: Two times a day (BID) | ORAL | Status: DC

## 2021-01-10 NOTE — Evaluation (Signed)
Physical Therapy Evaluation Patient Details Name: Rebecca Short MRN: [id number] DOB: 06-Jun-1940 Today's Date: 01/10/2021   History of Present Illness  Pt is a 80 y/o female admitted 8/11 with cough, SOB after recent hospitalization 7/31-8/2 and found with afib with RVR, pneumonia, pericardial effusion, bil pleural effusion. S/P thoracentesis 8/14, pericardiocentesis 8/15. PMH includes: afib, macular degeneration, OSA.   Clinical Impression  Pt tolerated treatment well with VSS throughout. Pt required min A for transfers and min guard for ambulation in hall with RW. Pt's deficits include decreased activity tolerance, balance, and strength. Pt would benefit from continuation of PT to improve listed deficits. Recommend HHPT upon d/c, as pt has family support available. Pt agreeable to d/c recommendation.    Follow Up Recommendations Home health PT    Equipment Recommendations  None recommended by PT;Other (comment) (Pt has equipment at home)    Recommendations for Other Services       Precautions / Restrictions Precautions Precautions: Fall Precaution Comments: monitor HR and O2 Restrictions Weight Bearing Restrictions: No      Mobility  Bed Mobility Overal bed mobility: Needs Assistance Bed Mobility: Supine to Sit     Supine to sit: Supervision;HOB elevated     General bed mobility comments: in recliner upon arrival    Transfers Overall transfer level: Needs assistance Equipment used: Rolling walker (2 wheeled) Transfers: Sit to/from Stand Sit to Stand: Min assist Stand pivot transfers: Min guard       General transfer comment: Pt performed sit to stand x4 (2 trials from [facility], 1 from recliner, and 1 from rolling chair hallway). Required min A to power up to standing for from recliner and chair in hallway. Min G for transfer from [facility]. Multimodal cueing provided for hand placement from stand to sit.  Ambulation/Gait Ambulation/Gait assistance: Min guard;+2  safety/equipment Gait Distance (Feet): 110 Feet Assistive device: Rolling walker (2 wheeled) Gait Pattern/deviations: Step-through pattern;Decreased stride length;Trunk flexed;Decreased dorsiflexion - right Gait velocity: Decreased Gait velocity interpretation: 1.31 - 2.62 ft/sec, indicative of limited community ambulator General Gait Details: Min guard +2 for lines. Ambulated 151ft prior to needing a seated rest. Pt reported feeling "huffy, puffy"; spO2 still > 90s with difficulty getting accurate reading due to system malfunction. Then pt ambulated an additional 141ft.  Verbal cueing provided for upright posture and maintaining hips in RW.  Stairs            Wheelchair Mobility    Modified Rankin (Stroke Patients Only)       Balance Overall balance assessment: Needs assistance Sitting-balance support: No upper extremity supported;Feet supported Sitting balance-Leahy Scale: Fair     Standing balance support: Bilateral upper extremity supported;During functional activity Standing balance-Leahy Scale: Poor Standing balance comment: BUE support provides greater stability                             Pertinent Vitals/Pain Pain Assessment: No/denies pain    Home Living Family/patient expects to be discharged to:: Private residence Living Arrangements: Spouse/significant other Available Help at Discharge: Family;Available 24 hours/day Type of Home: House Home Access: Stairs to enter Entrance Stairs-Rails: Doctor, general practice of Steps: 2 Home Layout: One level Home Equipment: Walker - 2 wheels;Cane - single point;Bedside commode;Adaptive equipment;Grab bars - tub/shower;Grab bars - toilet      Prior Function Level of Independence: Independent with assistive device(s)         Comments: uses cane PRN (usually at night going to  the bathroom)     Hand Dominance   Dominant Hand: Right    Extremity/Trunk Assessment   Upper Extremity  Assessment Upper Extremity Assessment: Defer to OT evaluation    Lower Extremity Assessment Lower Extremity Assessment: Generalized weakness    Cervical / Trunk Assessment Cervical / Trunk Assessment: Normal  Communication   Communication: No difficulties  Cognition Arousal/Alertness: Awake/alert Behavior During Therapy: WFL for tasks assessed/performed Overall Cognitive Status: Within Functional Limits for tasks assessed                                        General Comments General comments (skin integrity, edema, etc.): VSS on RA, spO2 93-96%, HR 77-103    Exercises     Assessment/Plan    PT Assessment Patient needs continued PT services  PT Problem List Decreased strength;Decreased mobility;Decreased activity tolerance;Decreased balance       PT Treatment Interventions DME instruction;Gait training;Therapeutic activities;Balance training;Patient/family education    PT Goals (Current goals can be found in the Care Plan section)  Acute Rehab PT Goals Patient Stated Goal: Get stronger and go home PT Goal Formulation: With patient/family Time For Goal Achievement: 01/24/21 Potential to Achieve Goals: Good    Frequency Min 3X/week   Barriers to discharge        Co-evaluation               AM-PAC PT "6 Clicks" Mobility  Outcome Measure Help needed turning from your back to your side while in a flat bed without using bedrails?: A Little Help needed moving from lying on your back to sitting on the side of a flat bed without using bedrails?: A Little Help needed moving to and from a bed to a chair (including a wheelchair)?: A Little Help needed standing up from a chair using your arms (e.g., wheelchair or bedside chair)?: A Little Help needed to walk in hospital room?: A Little Help needed climbing 3-5 steps with a railing? : A Lot 6 Click Score: 17    End of Session Equipment Utilized During Treatment: Gait belt Activity Tolerance:  Patient tolerated treatment well Patient left: in chair;with call bell/phone within reach;with family/visitor present Nurse Communication: Mobility status PT Visit Diagnosis: Unsteadiness on feet (R26.81);History of falling (Z91.81);Muscle weakness (generalized) (M62.81);Other abnormalities of gait and mobility (R26.89)    Time:  -      Charges:              Velda Shell, SPT Acute Rehab: [phone]     Vance Gather 01/10/2021, 12:00 PM

## 2021-01-10 NOTE — Evaluation (Signed)
Occupational Therapy Evaluation Patient Details Name: Rebecca Short MRN: [id number] DOB: [birth year] Today's Date: 01/10/2021    History of Present Illness Pt is a 80 y/o female admitted 8/11 with cough, SOB after recent hospitalization 7/31-8/2 and found with afib with RVR, pneumonia, pericardial effusion, bil pleural effusion. S/P thoracentesis 8/14, pericardiocentesis 8/15. PMH includes: afib, macular degeneration, OSA.   Clinical Impression   PTA patient independent with ADLs, mobility using cane as needed. She was admitted for above and is limited by problem list below, including impaired activity tolerance, generalized weakness, decreased endurance, and impaired balance.  She currently requires min assist for transfers and in room mobility, up to mod assist for ADLs.  SpO2 stable on RA, but noted RR increased to 30s with minimal activity; all other VSS.  She is highly motivated and eager to dc home with the support of her spouse and neighbor (retired Engineer, civil (consulting)).  Based on performance today, believe she will benefit from continued OT services while admitted and after dc at [facility] level to optimize independence, safety and tolerance for ADLs and mobility.     Follow Up Recommendations  Home health OT;Supervision - Intermittent    Equipment Recommendations  Tub/shower seat    Recommendations for Other Services       Precautions / Restrictions Precautions Precautions: Fall Precaution Comments: watch O2 Restrictions Weight Bearing Restrictions: No      Mobility Bed Mobility Overal bed mobility: Needs Assistance Bed Mobility: Supine to Sit     Supine to sit: Supervision;HOB elevated     General bed mobility comments: increased time but no physical assist required    Transfers Overall transfer level: Needs assistance   Transfers: Sit to/from Stand;Stand Pivot Transfers Sit to Stand: Min assist Stand pivot transfers: Min guard       General transfer comment: min assist to  power up and steady from EOB, min guard from [facility]    Balance Overall balance assessment: Needs assistance Sitting-balance support: No upper extremity supported;Feet supported Sitting balance-Leahy Scale: Fair     Standing balance support: Single extremity supported;During functional activity Standing balance-Leahy Scale: Fair Standing balance comment: pt reaching out for UE support, min guard for safety                           ADL either performed or assessed with clinical judgement   ADL Overall ADL's : Needs assistance/impaired     Grooming: Set up;Sitting           Upper Body Dressing : Minimal assistance;Sitting   Lower Body Dressing: Moderate assistance;Sit to/from stand   Toilet Transfer: Minimal assistance;Stand-pivot;BSC   Toileting- Clothing Manipulation and Hygiene: Minimal assistance;Sit to/from stand       Functional mobility during ADLs: Min guard       Vision Baseline Vision/History: Wears glasses Wears Glasses: At all times Vision Assessment?: No apparent visual deficits     Perception     Praxis      Pertinent Vitals/Pain Pain Assessment: No/denies pain     Hand Dominance Right   Extremity/Trunk Assessment Upper Extremity Assessment Upper Extremity Assessment: Generalized weakness   Lower Extremity Assessment Lower Extremity Assessment: Defer to PT evaluation   Cervical / Trunk Assessment Cervical / Trunk Assessment: Normal   Communication Communication Communication: No difficulties   Cognition Arousal/Alertness: Awake/alert Behavior During Therapy: WFL for tasks assessed/performed Overall Cognitive Status: Within Functional Limits for tasks assessed  General Comments  VSS on RA, SpO2 92% or greater, HR 70-80    Exercises     Shoulder Instructions      Home Living Family/patient expects to be discharged to:: Private residence Living Arrangements:  Spouse/significant other Available Help at Discharge: Family;Available 24 hours/day Type of Home: House Home Access: Stairs to enter [employer] of Steps: 2 Entrance Stairs-Rails: Right;Left Home Layout: One level     Bathroom Shower/Tub: Producer, television/film/video: Handicapped height     Home Equipment: Environmental consultant - 2 wheels;Cane - single point;Bedside commode;Adaptive equipment;Grab bars - tub/shower;Grab bars - toilet Adaptive Equipment: Reacher;Long-handled shoe horn;Long-handled sponge;Sock aid        Prior Functioning/Environment Level of Independence: Independent with assistive device(s)        Comments: uses cane PRN (usually at night going to the bathroom)        OT Problem List: Decreased strength;Decreased activity tolerance;Impaired balance (sitting and/or standing);Decreased knowledge of use of DME or AE;Cardiopulmonary status limiting activity      OT Treatment/Interventions: Self-care/ADL training;Therapeutic exercise;DME and/or AE instruction;Therapeutic activities;Patient/family education;Balance training;Energy conservation    OT Goals(Current goals can be found in the care plan section) Acute Rehab OT Goals Patient Stated Goal: get better and get home OT Goal Formulation: With patient Time For Goal Achievement: 01/24/21 Potential to Achieve Goals: Good  OT Frequency: Min 2X/week   Barriers to D/C:            Co-evaluation              AM-PAC OT "6 Clicks" Daily Activity     Outcome Measure Help from another person eating meals?: None Help from another person taking care of personal grooming?: A Little Help from another person toileting, which includes using toliet, bedpan, or urinal?: A Little Help from another person bathing (including washing, rinsing, drying)?: A Lot Help from another person to put on and taking off regular upper body clothing?: A Little Help from another person to put on and taking off regular lower body  clothing?: A Lot 6 Click Score: 17   End of Session Nurse Communication: Mobility status  Activity Tolerance: Patient tolerated treatment well Patient left: in chair;with call bell/phone within reach;with family/visitor present  OT Visit Diagnosis: Unsteadiness on feet (R26.81);Muscle weakness (generalized) (M62.81)                Time: 6415-8309 OT Time Calculation (min): 29 min Charges:  OT General Charges $OT Visit: 1 Visit OT Evaluation $OT Eval Moderate Complexity: 1 Mod OT Treatments $Self Care/Home Management : 8-22 mins  Barry Brunner, OT Acute Rehabilitation Services Pager [phone] Office [phone]   Chancy Milroy 01/10/2021, 10:37 AM

## 2021-01-10 NOTE — Progress Notes (Signed)
Received from [facility] via w/c to 6E07. Cardiac Monitor verified [id number]. Oriented to room/unit. Call bell and phone in reach.

## 2021-01-10 NOTE — Plan of Care (Signed)

## 2021-01-10 NOTE — Plan of Care (Signed)
  Problem: Activity: Goal: Risk for activity intolerance will decrease Outcome: Progressing   Problem: Nutrition: Goal: Adequate nutrition will be maintained Outcome: Progressing   

## 2021-01-10 NOTE — Progress Notes (Signed)
PROGRESS NOTE    Rebecca Short  UJW:[id number] DOB: [birth year] DOA: 01/03/2021 PCP: Daisy Floro, MD   Chief Complaint  Patient presents with   Atrial Fibrillation    Brief Narrative:  80 year old female with history of permanent A. fib on Eliquis, morbid obesity BMI 34 with OSA on CPAP presents with cough, shortness of breath progressively worse since leaving the hospital but he was hospitalized 7/31-8/2 for A. fib with RVR, hypotension.  Work-up in the ED showed A. fib with RVR, pneumonia and was admitted for further work-up, her work-up was significant for pericardial effusion, left pleural effusion, status postthoracentesis 8/14, work-up significant for exudate, and pericardial effusion without evidence of tamponade, status post pericardial effusion 8/15 with 570 cc fluid drained.  Subjective:  Reports feeling better today, no dyspnea, no chest pain.   Assessment & Plan:  Large and growing in size pericardial effusion with evidence of increasing right atrial pressure without tamponade:  -No evidence of lung mass on the CT, has had effusion since 7/31st.  -On colchicine and NSAIDs. -Management per cardiology, status post pericardiocentesis 8/15 with 570 cc drained.  Drain was removed, follow on cytology -Transfer to telemetry floor as discussed with cardiology. -Remains on ibuprofen, this was lowered to 600 mg p.o. twice daily.  acute heart failure with preserved EF:  - Suspecting tachycardia induced cardiomyopathy in the setting of A. fib RVR.  And pericardial effusion. . -Still with some evidence of volume overload, continue with IV diuresis, management per cardiology.  Renal function seems to be stable, likely will receive further IV diuresis today. Net IO Since Admission: 1,081.17 mL [01/10/21 1134]   Intake/Output Summary (Last 24 hours) at 01/10/2021 1134 Last data filed at 01/10/2021 1000 Gross per 24 hour  Intake 782.73 ml  Output 2800 ml  Net -2017.27 ml    Filed Weights   01/08/21 0600 01/09/21 0500 01/10/21 0500  Weight: 86 kg 85.6 kg 85.1 kg    Permanent atrial fibrillation with RVR, -  currently rate controlled on metoprolol 25 mg every 6 hours. - digoxin was held 8/16 level 1.2. -   Anticoagulation w/ heparin, now back on Eliquis.  Community acquired pneumonia / left pleural effusion Presented with leukocytosis Pro-Cal 0.11.  Received total 5 days of antibiotics. -Significant left pleural effusion status postthoracentesis of 950 cc on 8/14.  Exudative given LDL ratio is >0.6, so it is most likely parapneumonic versus inflammatory process(may be same process contributing to her pericardial effusion) -cytology  significant for mesothelial/inflammatory cells, no evidence of malignancy -No organisms seen on gram stain, cultures remain with NO growth. -repeat CXR 8/17  showing stable pleural effusion, no evidence of respiratory distress, will monitor closely on  diuresis, will hold on repeat thoracentesis, she was encouraged to ambulate, use incentive spirometry and flutter valve. -Continue with diuresis as tolerated.  Hyponatremia: Monitor  Hypokalemia: repleted   OSA on CPAP: Continue same  Morbid Obesity w/ BMI 34.9: Will benefit with weight loss, healthy lifestyle and PCP follow-up    Recent Labs  Lab 01/03/21 1737 01/03/21 2122 01/04/21 0228 01/06/21 0220 01/07/21 0158 01/08/21 0053 01/09/21 0222 01/10/21 0125  WBC  --   --    < > 17.3* 10.5 7.3 6.5 5.3  LATICACIDVEN 1.3 1.2  --   --   --   --   --   --   PROCALCITON 0.11  --   --   --   --   --   --   --    < > =  values in this interval not displayed.    Diet Order             Diet Carb Modified Fluid consistency: Thin; Room service appropriate? Yes  Diet effective now                   Patient's Body mass index is 34.31 kg/m.  DVT prophylaxis: SCD.  On heparin GTT>> Eliquis Code Status:   Code Status: Full Code  Family Communication: Husband at  bedside Status is: Inpatient Remains inpatient appropriate because:Ongoing diagnostic testing needed not appropriate for outpatient work up and Inpatient level of care appropriate due to severity of illness  Dispo: The patient is from: Home              Anticipated d/c is to: Home              Patient currently is not medically stable to d/c.   Difficult to place patient No Unresulted Labs (From admission, onward)     Start     Ordered   01/07/21 1604  Gram Stain/Body Fluid Culture  RELEASE UPON ORDERING,   TIMED       Comments: Specimen A: Pre-op diagnosis: pericardiocentesis    01/07/21 1604   01/05/21 0500  CBC  Daily,   R      01/04/21 1438           Medications reviewed:  Scheduled Meds:  apixaban  5 mg Oral BID   chlorhexidine  15 mL Mouth Rinse BID   Chlorhexidine Gluconate Cloth  6 each Topical Daily   colchicine  0.6 mg Oral BID   furosemide  40 mg Intravenous Once   guaiFENesin  600 mg Oral BID   ibuprofen  600 mg Oral BID   metoprolol tartrate  25 mg Oral Q6H   pantoprazole  40 mg Oral Daily   sodium chloride flush  3 mL Intravenous Q12H   Continuous Infusions:  sodium chloride Stopped (01/08/21 1938)   Consultants: - cardiology  Procedures: -Thoracentesis 8/14. -Pericardiocentesis 8/15  Antimicrobials: Anti-infectives (From admission, onward)    Start     Dose/Rate Route Frequency Ordered Stop   01/04/21 1200  cefTRIAXone (ROCEPHIN) 1 g in sodium chloride 0.9 % 100 mL IVPB  Status:  Discontinued        1 g 200 mL/hr over 30 Minutes Intravenous  Once 01/03/21 1636 01/03/21 1713   01/04/21 1000  cefTRIAXone (ROCEPHIN) 1 g in sodium chloride 0.9 % 100 mL IVPB        1 g 200 mL/hr over 30 Minutes Intravenous Every 24 hours 01/03/21 1713 01/08/21 0800   01/04/21 1000  doxycycline (VIBRA-TABS) tablet 100 mg        100 mg Oral Every 12 hours 01/04/21 0738 01/07/21 2027   01/03/21 1215  cefTRIAXone (ROCEPHIN) 1 g in sodium chloride 0.9 % 100 mL IVPB         1 g 200 mL/hr over 30 Minutes Intravenous  Once 01/03/21 1209 01/03/21 1355   01/03/21 1215  azithromycin (ZITHROMAX) 500 mg in sodium chloride 0.9 % 250 mL IVPB        500 mg 250 mL/hr over 60 Minutes Intravenous  Once 01/03/21 1209 01/03/21 1502      Culture/Microbiology    Component Value Date/Time   SDES FLUID PERICARDIAL 01/07/2021 1604   SPECREQUEST NONE 01/07/2021 1604   CULT  01/07/2021 1604    NO GROWTH 3 DAYS Performed at [facility]  Lab, [address], [place], [place] [address]    REPTSTATUS PENDING 01/07/2021 1604    Other culture-see note  Objective: Vitals: Today's Vitals   01/10/21 0700 01/10/21 0800 01/10/21 0900 01/10/21 1000  BP:  131/74 133/71 135/77  Pulse:      Resp: (!) 30 (!) 25 (!) 25 (!) 25  Temp:  97.7 F (36.5 C)    TempSrc:  Oral    SpO2: 92% 94% 94% 100%  Weight:      Height:      PainSc:  0-No pain      Intake/Output Summary (Last 24 hours) at 01/10/2021 1134 Last data filed at 01/10/2021 1000 Gross per 24 hour  Intake 782.73 ml  Output 2800 ml  Net -2017.27 ml   Filed Weights   01/08/21 0600 01/09/21 0500 01/10/21 0500  Weight: 86 kg 85.6 kg 85.1 kg   Weight change: -0.5 kg  Intake/Output from previous day: 08/17 0701 - 08/18 0700 In: 836.8 [P.O.:730; I.V.:65.9; IV Piggyback:40.9] Out: 2800 [Urine:2800] Intake/Output this shift: Total I/O In: 360 [P.O.:360] Out: -  Filed Weights   01/08/21 0600 01/09/21 0500 01/10/21 0500  Weight: 86 kg 85.6 kg 85.1 kg   Examination:  Awake Alert, Oriented X 3, No new F.N deficits, Normal affect Symmetrical Chest wall movement, diminished air entry at the bases RRR,No Gallops,Rubs or new Murmurs, No Parasternal Heave +ve B.Sounds, Abd Soft, No tenderness, No rebound - guarding or rigidity. No Cyanosis, Clubbing or edema, No new Rash or bruise       CBC: Recent Labs  Lab 01/06/21 0220 01/07/21 0158 01/08/21 0053 01/09/21 0222 01/10/21 0125  WBC 17.3* 10.5 7.3 6.5 5.3   HGB 13.1 12.8 12.4 12.8 12.8  HCT 39.3 38.1 37.4 37.4 39.4  MCV 89.5 88.6 88.4 88.6 89.5  PLT 308 319 309 384 333   Basic Metabolic Panel: Recent Labs  Lab 01/06/21 0220 01/07/21 0158 01/08/21 0053 01/09/21 0407 01/10/21 0125  NA 134* 136 137 138 140  K 4.0 4.0 3.3* 4.5 3.6  CL 103 106 108 112* 109  CO2 22 22 21* 20* 23  GLUCOSE 136* 110* 115* 95 114*  BUN 24* 32* 32* 35* 35*  CREATININE 1.03* 1.38* 1.37* 1.09* 1.36*  CALCIUM 8.2* 8.1* 8.1* 8.4* 8.4*  MG 2.0 1.9 1.9 2.2 1.8   GFR: Estimated Creatinine Clearance: 33.4 mL/min (A) (by C-G formula based on SCr of 1.36 mg/dL (H)). Liver Function Tests: Recent Labs  Lab 01/06/21 1359  PROT 5.2*   No results for input(s): LIPASE, AMYLASE in the last 168 hours. No results for input(s): AMMONIA in the last 168 hours. Coagulation Profile: No results for input(s): INR, PROTIME in the last 168 hours.  Cardiac Enzymes: No results for input(s): CKTOTAL, CKMB, CKMBINDEX, TROPONINI in the last 168 hours. BNP (last 3 results) No results for input(s): PROBNP in the last 8760 hours. HbA1C: No results for input(s): HGBA1C in the last 72 hours. CBG: Recent Labs  Lab 01/07/21 2138 01/08/21 0812 01/08/21 1135 01/08/21 1609 01/09/21 0833  GLUCAP 136* 81 140* 85 104*   Lipid Profile: No results for input(s): CHOL, HDL, LDLCALC, TRIG, CHOLHDL, LDLDIRECT in the last 72 hours. Thyroid Function Tests: No results for input(s): TSH, T4TOTAL, FREET4, T3FREE, THYROIDAB in the last 72 hours. Anemia Panel: No results for input(s): VITAMINB12, FOLATE, FERRITIN, TIBC, IRON, RETICCTPCT in the last 72 hours. Sepsis Labs: Recent Labs  Lab 01/03/21 1737 01/03/21 2122  PROCALCITON 0.11  --   LATICACIDVEN  1.3 1.2    Recent Results (from the past 240 hour(s))  Resp Panel by RT-PCR (Flu A&B, Covid) Nasopharyngeal Swab     Status: None   Collection Time: 01/03/21 10:14 AM   Specimen: Nasopharyngeal Swab; Nasopharyngeal(NP) swabs in vial  transport medium  Result Value Ref Range Status   SARS Coronavirus 2 by RT PCR NEGATIVE NEGATIVE Final    Comment: (NOTE) SARS-CoV-2 target nucleic acids are NOT DETECTED.  The SARS-CoV-2 RNA is generally detectable in upper respiratory specimens during the acute phase of infection. The lowest concentration of SARS-CoV-2 viral copies this assay can detect is 138 copies/mL. A negative result does not preclude SARS-Cov-2 infection and should not be used as the sole basis for treatment or other patient management decisions. A negative result may occur with  improper specimen collection/handling, submission of specimen other than nasopharyngeal swab, presence of viral mutation(s) within the areas targeted by this assay, and inadequate number of viral copies(<138 copies/mL). A negative result must be combined with clinical observations, patient history, and epidemiological information. The expected result is Negative.  Fact Sheet for Patients:  BloggerCourse.com  Fact Sheet for Healthcare Providers:  SeriousBroker.it  This test is no t yet approved or cleared by the [place] FDA and  has been authorized for detection and/or diagnosis of SARS-CoV-2 by FDA under an Emergency Use Authorization (EUA). This EUA will remain  in effect (meaning this test can be used) for the duration of the COVID-19 declaration under Section 564(b)(1) of the Act, 21 U.S.C.section 360bbb-3(b)(1), unless the authorization is terminated  or revoked sooner.       Influenza A by PCR NEGATIVE NEGATIVE Final   Influenza B by PCR NEGATIVE NEGATIVE Final    Comment: (NOTE) The Xpert Xpress SARS-CoV-2/FLU/RSV plus assay is intended as an aid in the diagnosis of influenza from Nasopharyngeal swab specimens and should not be used as a sole basis for treatment. Nasal washings and aspirates are unacceptable for Xpert Xpress SARS-CoV-2/FLU/RSV testing.  Fact  Sheet for Patients: BloggerCourse.com  Fact Sheet for Healthcare Providers: SeriousBroker.it  This test is not yet approved or cleared by the [place] FDA and has been authorized for detection and/or diagnosis of SARS-CoV-2 by FDA under an Emergency Use Authorization (EUA). This EUA will remain in effect (meaning this test can be used) for the duration of the COVID-19 declaration under Section 564(b)(1) of the Act, 21 U.S.C. section 360bbb-3(b)(1), unless the authorization is terminated or revoked.  Performed at [facility] Lab, [address], [place], [place] [address]   Body fluid culture w Gram Stain     Status: None   Collection Time: 01/06/21  1:07 PM   Specimen: Lung, Left; Pleural Fluid  Result Value Ref Range Status   Specimen Description PLEURAL FLUID  Final   Special Requests LUNG RIGHT  Final   Gram Stain   Final    RARE WBC PRESENT,BOTH PMN AND MONONUCLEAR NO ORGANISMS SEEN    Culture   Final    NO GROWTH Performed at [facility] Lab, [address], [place], [place] [address]    Report Status 01/09/2021 FINAL  Final  Body fluid culture w Gram Stain     Status: None (Preliminary result)   Collection Time: 01/07/21  4:04 PM   Specimen: PATH Cytology Misc. fluid; Body Fluid  Result Value Ref Range Status   Specimen Description FLUID PERICARDIAL  Final   Special Requests NONE  Final   Gram Stain  Final    NO SQUAMOUS EPITHELIAL CELLS SEEN ABUNDANT WBC PRESENT, PREDOMINANTLY MONONUCLEAR NO ORGANISMS SEEN    Culture   Final    NO GROWTH 3 DAYS Performed at [facility] Lab, [address], [place], [place] [address]    Report Status PENDING  Incomplete  MRSA Next Gen by PCR, Nasal     Status: None   Collection Time: 01/07/21  6:13 PM   Specimen: Nasal Mucosa; Nasal Swab  Result Value Ref Range Status   MRSA by PCR Next Gen NOT DETECTED NOT DETECTED Final    Comment: (NOTE) The GeneXpert MRSA  Assay (FDA approved for NASAL specimens only), is one component of a comprehensive MRSA colonization surveillance program. It is not intended to diagnose MRSA infection nor to guide or monitor treatment for MRSA infections. Test performance is not FDA approved in patients less than 57 years old. Performed at [facility] Lab, [address], [place], [place] [address]      Radiology Studies: DG Chest Port 1 View  Result Date: 01/09/2021 CLINICAL DATA:  Pleural effusion. EXAM: PORTABLE CHEST 1 VIEW COMPARISON:  01/07/2021 FINDINGS: Stable cardiomediastinal contours. Moderate to large left pleural effusion and moderate right pleural effusion are unchanged compared with the previous exam. There is overlying atelectasis. Pulmonary vascular congestion is similar. IMPRESSION: 1. No change in bilateral pleural effusions, left greater than right. 2. Stable pulmonary vascular congestion. Electronically Signed   By: Signa Kell M.D.   On: 01/09/2021 10:38   ECHOCARDIOGRAM LIMITED  Result Date: 01/08/2021    ECHOCARDIOGRAM LIMITED REPORT   Patient Name:   Rebecca Short Date of Exam: 01/08/2021 Medical Rec #:  [id number]     Height:       62.0 in Accession #:    [id number]    Weight:       189.6 lb Date of Birth:  [birth year]     BSA:          1.869 m Patient Age:    79 years      BP:           140/67 mmHg Patient Gender: F             HR:           85 bpm. Exam Location:  Inpatient Procedure: Limited Echo and Limited Color Doppler Indications:    Pericardial effusion follow up  History:        Patient has prior history of Echocardiogram examinations, most                 recent 01/07/2021. 01/07/21 Pericardiocentesis.  Sonographer:    Neomia Dear RDCS Referring Phys: [id number] IMPRESSIONS  1. Left ventricular ejection fraction, by estimation, is 60 to 65%. The left ventricle has normal function. The left ventricle has no regional wall motion abnormalities.  2. Right ventricular systolic function is  normal. The right ventricular size is normal. There is normal pulmonary artery systolic pressure.  3. Post pericardiocentesis with no reaccumulation Persistent large left pleural effusion . The pericardial effusion is posterior to the left ventricle. Large pleural effusion in the left lateral region.  4. The mitral valve is abnormal. Trivial mitral valve regurgitation. No evidence of mitral stenosis.  5. The aortic valve is tricuspid. There is mild calcification of the aortic valve. Aortic valve regurgitation is not visualized. Mild aortic valve sclerosis is present, with no evidence of aortic valve stenosis.  6. The inferior vena cava  is normal in size with greater than 50% respiratory variability, suggesting right atrial pressure of 3 mmHg. FINDINGS  Left Ventricle: Left ventricular ejection fraction, by estimation, is 60 to 65%. The left ventricle has normal function. The left ventricle has no regional wall motion abnormalities. The left ventricular internal cavity size was normal in size. There is  no left ventricular hypertrophy. Right Ventricle: The right ventricular size is normal. No increase in right ventricular wall thickness. Right ventricular systolic function is normal. There is normal pulmonary artery systolic pressure. The tricuspid regurgitant velocity is 2.40 m/s, and  with an assumed right atrial pressure of 3 mmHg, the estimated right ventricular systolic pressure is 26.0 mmHg. Left Atrium: Left atrial size was normal in size. Right Atrium: Right atrial size was normal in size. Pericardium: Post pericardiocentesis with no reaccumulation Persistent large left pleural effusion. Trivial pericardial effusion is present. The pericardial effusion is posterior to the left ventricle. Mitral Valve: The mitral valve is abnormal. There is mild thickening of the mitral valve leaflet(s). There is mild calcification of the mitral valve leaflet(s). Trivial mitral valve regurgitation. No evidence of mitral valve  stenosis. Tricuspid Valve: The tricuspid valve is not assessed. Tricuspid valve regurgitation is not demonstrated. No evidence of tricuspid stenosis. Aortic Valve: The aortic valve is tricuspid. There is mild calcification of the aortic valve. Aortic valve regurgitation is not visualized. Mild aortic valve sclerosis is present, with no evidence of aortic valve stenosis. Pulmonic Valve: The pulmonic valve was not assessed. Pulmonic valve regurgitation is not visualized. No evidence of pulmonic stenosis. Aorta: The aortic root is normal in size and structure. Venous: The inferior vena cava is normal in size with greater than 50% respiratory variability, suggesting right atrial pressure of 3 mmHg. IAS/Shunts: No atrial level shunt detected by color flow Doppler. Additional Comments: There is a large pleural effusion in the left lateral region. LEFT ATRIUM           Index LA Vol (A4C): 35.3 ml 18.89 ml/m  MV E velocity: 88.70 cm/s  TRICUSPID VALVE                            TR Peak grad:   23.0 mmHg                            TR Vmax:        240.00 cm/s Charlton Haws MD Electronically signed by Charlton Haws MD Signature Date/Time: 01/08/2021/12:36:44 PM    Final      LOS: 7 days   Huey Bienenstock, MD [facility]  01/10/2021, 11:34 AM

## 2021-01-10 NOTE — Progress Notes (Signed)
RT note. Patient home cpap unit all set up for the night for patient to use. Patient can place self on cpap when ready for bed.

## 2021-01-11 LAB — BASIC METABOLIC PANEL
Anion gap: 10 (ref 5–15)
BUN: 38 mg/dL — ABNORMAL HIGH (ref 8–23)
CO2: 23 mmol/L (ref 22–32)
Calcium: 8.7 mg/dL — ABNORMAL LOW (ref 8.9–10.3)
Chloride: 109 mmol/L (ref 98–111)
Creatinine, Ser: 1.21 mg/dL — ABNORMAL HIGH (ref 0.44–1.00)
GFR, Estimated: 45 mL/min — ABNORMAL LOW (ref >60)
Glucose, Bld: 133 mg/dL — ABNORMAL HIGH (ref 70–99)
Potassium: 3.5 mmol/L (ref 3.5–5.1)
Sodium: 142 mmol/L (ref 135–145)

## 2021-01-11 LAB — BODY FLUID CULTURE W GRAM STAIN
Culture: NO GROWTH
Gram Stain: NONE SEEN

## 2021-01-11 LAB — CBC
HCT: 39.3 % (ref 36.0–46.0)
Hemoglobin: 12.7 g/dL (ref 12.0–15.0)
MCH: 29 pg (ref 26.0–34.0)
MCHC: 32.3 g/dL (ref 30.0–36.0)
MCV: 89.7 fL (ref 80.0–100.0)
Platelets: 332 10*3/uL (ref 150–400)
RBC: 4.38 MIL/uL (ref 3.87–5.11)
RDW: 14.2 % (ref 11.5–15.5)
WBC: 5.9 10*3/uL (ref 4.0–10.5)
nRBC: 0 % (ref 0.0–0.2)

## 2021-01-11 MED ORDER — FUROSEMIDE 10 MG/ML IJ SOLN
20.0000 mg | Freq: Once | INTRAMUSCULAR | Status: AC
  Administered 2021-01-11: 20 mg via INTRAVENOUS
  Filled 2021-01-11: qty 2

## 2021-01-11 MED ORDER — METOPROLOL TARTRATE 50 MG PO TABS: 50.0000 mg | ORAL_TABLET | Freq: Two times a day (BID) | ORAL | 0 refills | Status: DC

## 2021-01-11 MED ORDER — COLCHICINE 0.6 MG PO TABS: 0.6000 mg | ORAL_TABLET | Freq: Every day | ORAL | 0 refills | Status: DC

## 2021-01-11 MED ORDER — METOPROLOL TARTRATE 50 MG PO TABS: 50.0000 mg | ORAL_TABLET | Freq: Two times a day (BID) | ORAL | Status: DC

## 2021-01-11 MED ORDER — FUROSEMIDE 40 MG PO TABS: 40.0000 mg | ORAL_TABLET | Freq: Every day | ORAL | 0 refills | Status: DC

## 2021-01-11 NOTE — Care Management Important Message (Signed)
Important Message  Patient Details  Name: Rebecca Short MRN: [id number] Date of Birth: 06-Feb-1941   Medicare Important Message Given:  Yes     [name] 01/11/2021, 8:43 AM

## 2021-01-11 NOTE — Consult Note (Signed)
   [employer] [facility] Inpatient Consult   01/11/2021  Rebecca Short Apr 07, 1941 [id number]  [facility] [[facility]]  Accountable Care Organization [ACO] Patient: Medicare  Primary Care Provider:  Daisy Floro, MD [facility] Physician at [facility]   Patient screened for less than 30 days readmission hospitalization with noted.  Review of patient's medical record reveals patient is for home with home health.   Plan:  No additional  needs assess for post hospital care management needs.    For questions contact:   Charlesetta Shanks, RN BSN CCM [facility]  [phone] business mobile phone Toll free office [phone]  Fax number: [phone] [email or web address]

## 2021-01-11 NOTE — Progress Notes (Signed)
Occupational Therapy Treatment Patient Details Name: Rebecca Short MRN: [id number] DOB: 28-Aug-1940 Today's Date: 01/11/2021    History of present illness Pt is a 80 y/o female admitted 8/11 with cough, SOB after recent hospitalization 7/31-8/2 and found with afib with RVR, pneumonia, pericardial effusion, bil pleural effusion. S/P thoracentesis 8/14, pericardiocentesis 8/15. PMH includes: afib, macular degeneration, OSA.   OT comments  Pt. Seen for skilled OT treatment. Motivated to participate and eager for d/c home later today.  Able to demonstrate bed mobility S.  Amb. To/from b.room without any lob. Demonstrates good safety awareness during functional mobility.  Utilizing furniture walking intermittently.  Reports husband able to assist prn at home and is very active with her care.    Follow Up Recommendations  Home health OT;Supervision - Intermittent    Equipment Recommendations  Tub/shower seat    Recommendations for Other Services      Precautions / Restrictions Precautions Precautions: Fall Precaution Comments: monitor HR and O2 Restrictions Weight Bearing Restrictions: No       Mobility Bed Mobility Overal bed mobility: Needs Assistance Bed Mobility: Supine to Sit;Sit to Supine     Supine to sit: Supervision Sit to supine: Supervision   General bed mobility comments: hob flat to simulate home env. and also exit on R side as she does at home    Transfers Overall transfer level: Needs assistance Equipment used: None Transfers: Sit to/from [employer] Transfers Sit to Stand: Min guard Stand pivot transfers: Min guard       General transfer comment: pt. opted to amb. withouth dme utilized intermittent furniture walking, [place] not holding onto things that are not sturdy    Balance                                           ADL either performed or assessed with clinical judgement   ADL Overall ADL's : Needs assistance/impaired                          Toilet Transfer: Hydrographic surveyor Details (indicate cue type and reason): amb. with intermittent furniture walking to b.room declined need for actual use-reviewed benefits of 3n1 at night by the bed for safety she liked this idea     Tub/ Engineer, structural: Education officer, environmental Details (indicate cue type and reason): demonstrated safety with simulated steps over ledge in/out of walk in shower Functional mobility during ADLs: Min guard General ADL Comments: pt. utilized furniture walking throughout the session safely.  reviewed benefits of 3n1 use at night beside bed for safety. hx. of vertigo so has husband walk her each night and this would reduce that need also.  she liked this idea for safety also states she will wear adult brief at night for extra security to prevent need to "rush".  discussed healthy heart habits also. ie: daily weights, checking for edema, paying attention to how she is breathing. states friend across the st. is retiered rn and will be checking on her daily also     Vision       Perception     Praxis      Cognition Arousal/Alertness: Awake/alert Behavior During Therapy: WFL for tasks assessed/performed Overall Cognitive Status: Within Functional Limits for tasks assessed  Exercises     Shoulder Instructions       General Comments      Pertinent Vitals/ Pain       Pain Assessment: No/denies pain  Home Living                                          Prior Functioning/Environment              Frequency  Min 2X/week        Progress Toward Goals  OT Goals(current goals can now be found in the care plan section)  Progress towards OT goals: Progressing toward goals     Plan Discharge plan remains appropriate    Co-evaluation                 AM-PAC OT "6 Clicks" Daily Activity     Outcome Measure   Help  from another person eating meals?: None Help from another person taking care of personal grooming?: A Little Help from another person toileting, which includes using toliet, bedpan, or urinal?: A Little Help from another person bathing (including washing, rinsing, drying)?: A Lot Help from another person to put on and taking off regular upper body clothing?: A Little Help from another person to put on and taking off regular lower body clothing?: A Lot 6 Click Score: 17    End of Session    OT Visit Diagnosis: Unsteadiness on feet (R26.81);Muscle weakness (generalized) (M62.81)   Activity Tolerance Patient tolerated treatment well   Patient Left in bed;with call bell/phone within reach   Nurse Communication          Time: 0814-4818 OT Time Calculation (min): 14 min  Charges: OT General Charges $OT Visit: 1 Visit OT Treatments $Self Care/Home Management : 8-22 mins  Boneta Lucks, COTA/L Acute Rehabilitation [phone]    Salvadore Oxford 01/11/2021, 10:35 AM

## 2021-01-11 NOTE — Discharge Summary (Signed)
Physician Discharge Summary  Rebecca Short:[id number] DOB: [birth year] DOA: 01/03/2021  PCP: Daisy Floro, MD  Admit date: 01/03/2021 Discharge date: 01/11/2021  Admitted From: Home Disposition:  Home   Recommendations for Outpatient Follow-up:  Follow up with PCP in 1-2 weeks Please obtain BMP/CBC in one week   Home Health:YES   Discharge Condition:Stable CODE STATUS:FULL Diet recommendation: Heart Healthy   Brief/Interim Summary: 80 year old female with history of permanent A. fib on Eliquis, morbid obesity BMI 34 with OSA on CPAP presents with cough, shortness of breath progressively worse since leaving the hospital but he was hospitalized 7/31-8/2 for A. fib with RVR, hypotension.  Work-up in the ED showed A. fib with RVR, pneumonia and was admitted for further work-up, her work-up was significant for pericardial effusion, left pleural effusion, status postthoracentesis 8/14, work-up significant for exudate, and pericardial effusion without evidence of tamponade, status post pericardial effusion 8/15 with 570 cc fluid drained.    Large and growing in size pericardial effusion with evidence of increasing right atrial pressure without tamponade:  -No evidence of lung mass on the CT, has had effusion since 7/31st.  -On colchicine and NSAIDs initially, but NSAIDs has been discontinued at time of discharge, continue with colchicine only. -Management per cardiology, status post pericardiocentesis 8/15 with 570 cc drained.  Drain was removed, 8/16.    acute heart failure with preserved EF:  - Suspecting tachycardia induced cardiomyopathy in the setting of A. fib RVR.  And pericardial effusion. . -Patient was appropriately diuresed during hospital stay, she does appear to be euvolemic at the time of discharge, she is with no hypoxia, no oxygen requirement, symptoms significantly improved, she will be discharged home on Lasix 40 mg oral daily .          Filed Weights     01/08/21 0600 01/09/21 0500 01/10/21 0500  Weight: 86 kg 85.6 kg 85.1 kg    Permanent atrial fibrillation with RVR, -Initially rate Controlled, she did require digoxin given soft blood pressure, she was admitted for 25 mg every 6 hours, RVR has significantly improved after pericardiocentesis, she will be discharged on metoprolol 50 mg oral twice daily, no need for digoxin at time of discharge, she is back on her home dose Eliquis    Community acquired pneumonia / left pleural effusion Presented with leukocytosis Pro-Cal 0.11.  Received total 5 days of antibiotics. -Significant left pleural effusion status postthoracentesis of 950 cc on 8/14.  Exudative given LDL ratio is >0.6, so it is most likely parapneumonic versus inflammatory process(may be same process contributing to her pericardial effusion) -cytology  significant for mesothelial/inflammatory cells, no evidence of malignancy -No organisms seen on gram stain, cultures remain with NO growth. -repeat CXR 8/17  showing stable pleural effusion, no evidence of respiratory distress, will monitor closely on  diuresis, will hold on repeat thoracentesis, she was encouraged to ambulate, use incentive spirometry and flutter valve.   Hyponatremia:    Hypokalemia: repleted    OSA on CPAP: Continue same   Morbid Obesity w/ BMI 34.9: Will benefit with weight loss, healthy lifestyle and PCP follow-up    Consultants: - cardiology   Procedures: -Thoracentesis 8/14. -Pericardiocentesis 8/15   Discharge Diagnoses:  Principal Problem:   Atrial fibrillation with RVR (HCC) Active Problems:   OSA (obstructive sleep apnea)   Obesity (BMI 30-39.9)   Anticoagulated   Leukocytosis   Community acquired pneumonia   Pericardial effusion   Arterial hypotension   Pleural effusion  Discharge Instructions  Discharge Instructions     Diet - low sodium heart healthy   Complete by: As directed    Discharge instructions   Complete by: As  directed    Follow with Primary MD Daisy Floro, MD in 7 days   Get CBC, CMP, 2 view Chest X ray checked  by Primary MD next visit.    Activity: As tolerated with Full fall precautions use walker/cane & assistance as needed   Disposition Home    Diet: Heart Healthy  , with feeding assistance and aspiration precautions.  For Heart failure patients - Check your Weight same time everyday, if you gain over 2 pounds, or you develop in leg swelling, experience more shortness of breath or chest pain, call your Primary MD immediately. Follow Cardiac Low Salt Diet and 1.5 lit/day fluid restriction.   On your next visit with your primary care physician please Get Medicines reviewed and adjusted.   Please request your Prim.MD to go over all Hospital Tests and Procedure/Radiological results at the follow up, please get all Hospital records sent to your Prim MD by signing hospital release before you go home.   If you experience worsening of your admission symptoms, develop shortness of breath, life threatening emergency, suicidal or homicidal thoughts you must seek medical attention immediately by calling 911 or calling your MD immediately  if symptoms less severe.  You Must read complete instructions/literature along with all the possible adverse reactions/side effects for all the Medicines you take and that have been prescribed to you. Take any new Medicines after you have completely understood and accpet all the possible adverse reactions/side effects.   Do not drive, operating heavy machinery, perform activities at heights, swimming or participation in water activities or provide baby sitting services if your were admitted for syncope or siezures until you have seen by Primary MD or a Neurologist and advised to do so again.  Do not drive when taking Pain medications.    Do not take more than prescribed Pain, Sleep and Anxiety Medications  Special Instructions: If you have smoked or  chewed Tobacco  in the last 2 yrs please stop smoking, stop any regular Alcohol  and or any Recreational drug use.  Wear Seat belts while driving.   Please note  You were cared for by a hospitalist during your hospital stay. If you have any questions about your discharge medications or the care you received while you were in the hospital after you are discharged, you can call the unit and asked to speak with the hospitalist on call if the hospitalist that took care of you is not available. Once you are discharged, your primary care physician will handle any further medical issues. Please note that NO REFILLS for any discharge medications will be authorized once you are discharged, as it is imperative that you return to your primary care physician (or establish a relationship with a primary care physician if you do not have one) for your aftercare needs so that they can reassess your need for medications and monitor your lab values.   Increase activity slowly   Complete by: As directed       Allergies as of 01/11/2021       Reactions   Azithromycin Other (See Comments)   Pain in arm with IV infusion   Condrolite [glucosamine-chondroitin-msm] Rash        Medication List     STOP taking these medications    cyclobenzaprine 5 MG tablet  Commonly known as: FLEXERIL       TAKE these medications    acetaminophen 325 MG tablet Commonly known as: TYLENOL Take 2 tablets (650 mg total) by mouth every 6 (six) hours as needed for moderate pain or mild pain.   CALCIUM CITRATE-VITAMIN D PO Take 1 tablet by mouth at bedtime.   carboxymethylcellulose 0.5 % Soln Commonly known as: REFRESH PLUS Place 1 drop into both eyes 2 (two) times daily.   cholecalciferol 25 MCG (1000 UNIT) tablet Commonly known as: VITAMIN D3 Take 1,000 Units by mouth daily.   colchicine 0.6 MG tablet Take 1 tablet (0.6 mg total) by mouth daily.   Eliquis 5 MG Tabs tablet Generic drug: apixaban TAKE 1 TABLET  BY MOUTH TWICE A DAY What changed: how much to take   EpiPen 2-Pak 0.3 mg/0.3 mL Soaj injection Generic drug: EPINEPHrine Inject 0.3 mg into the muscle once as needed for anaphylaxis (severe allergic reaction).   furosemide 40 MG tablet Commonly known as: Lasix Take 1 tablet (40 mg total) by mouth daily.   metoprolol tartrate 50 MG tablet Commonly known as: LOPRESSOR Take 1 tablet (50 mg total) by mouth 2 (two) times daily. What changed:  medication strength how much to take   multivitamin with minerals Tabs tablet Take 1 tablet by mouth every morning.   PRESCRIPTION MEDICATION every 14 (fourteen) days. Allergy shots administered by Dr. [place] Callas   PRESERVISION AREDS PO Take 1 capsule by mouth 2 (two) times daily.        Follow-up Information     Daisy Floro, MD Follow up.   Specialty: Family Medicine Contact information: [address] [place] [address] [phone]         [facility] Follow up.   Specialty: Home Health Services Why: HHPT arranged- they will contact you to schedule home visit Contact information: [address] [place] [address] [phone]                Allergies  Allergen Reactions   Azithromycin Other (See Comments)    Pain in arm with IV infusion    Condrolite [Glucosamine-Chondroitin-Msm] Rash     Procedures/Studies: DG Chest 1 View  Result Date: 01/06/2021 CLINICAL DATA:  Status post thoracentesis on the left. EXAM: CHEST  1 VIEW COMPARISON:  January 05, 2021 FINDINGS: The patient is status post thoracentesis on the left. A left effusion with underlying opacity remains but is smaller. No pneumothorax. A small right pleural effusion with underlying atelectasis remains. No other changes. IMPRESSION: No pneumothorax after left thoracentesis. Bilateral pleural effusions remain with underlying atelectasis. Electronically Signed   By: Gerome Sam III M.D.   On: 01/06/2021 13:26   CT Chest Wo  Contrast  Result Date: 01/03/2021 CLINICAL DATA:  Chest pain or SOB, pleurisy or effusion suspected pleural effusion, concern for pna vs fluid EXAM: CT CHEST WITHOUT CONTRAST TECHNIQUE: Multidetector CT imaging of the chest was performed following the standard protocol without IV contrast. COMPARISON:  CT 12/23/2020. FINDINGS: Cardiovascular: Biatrial enlargement.Increased, moderate size effusion.Normal size main and branch pulmonary arteries.Minimal thoracic aortic atherosclerotic calcifications. Mediastinum/Nodes: Unchanged calcified left hilar lymph node. No other lymphadenopathy.The thyroid is unremarkable.Esophagus is mildly dilated.The trachea is unremarkable. Lungs/Pleura: There is a moderate left and small right pleural effusions, increased on the left and new on the right comparison to prior exam. There is adjacent bibasilar and lingular atelectasis.Interlobular septal thickening ground-glass opacities.No suspicious pulmonary nodules or masses. No pneumothorax. Upper Abdomen: Calcified splenic  granulomas.  No acute abnormality. Musculoskeletal: No acute osseous abnormality.There is a T6 vertebral body hemangioma. Bridging anterior fights and shin and fused sternum compatible with diffuse idiopathic skeletal hyperostosis. IMPRESSION: Moderate left and small right pleural effusions, which is increased on the left and new on the right in comparison to prior CT. Adjacent bibasilar and lingular atelectasis. Increased, moderate-sized pericardial effusion. Biatrial enlargement. Mild pulmonary edema. Electronically Signed   By: Caprice Renshaw M.D.   On: 01/03/2021 13:22   CARDIAC CATHETERIZATION  Result Date: 01/07/2021 Conclusions: Successful pericardiocentesis and pericardial drain placement via the subxiphoid approach, yielding 570 mL of serosanguinous fluid. Recommendations: Repeat limited echo tomorrow to ensure pericardial effusion is not reaccumulating. Maintain pericardial drain to vacuum drain placed in  cath lab; record output every 4 hours. Obtain portable chest radiograph. Yvonne Kendall, MD [facility]  CT L-SPINE NO CHARGE  Result Date: 12/23/2020 CLINICAL DATA:  Low back and abdominal pain for approximately 2 days. No known injury. EXAM: CT Lumbar Spine with contrast TECHNIQUE: Technique: Multiplanar CT images of the lumbar spine were reconstructed from contemporary CT of the Abdomen and Pelvis. CONTRAST:  No additional. COMPARISON:  Plain films lumbar spine 01/31/2015. FINDINGS: Segmentation: The patient has transitional lumbosacral anatomy with sacralization of the lowest lumbar segment on the right. Alignment: No listhesis. Convex left scoliosis with the apex at L2-3. Vertebrae: There is a Schmorl's node versus central inferior endplate compression fracture L4. No other finding to suggest fracture. No worrisome bony lesion. Paraspinal and other soft tissues: See report of CT angiogram of the chest, abdomen and pelvis today. 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 Degenerative change appears worst at L3-4 and L4-5 where there is ligamentum flavum thickening and disc bulging. Facet arthropathy is also present at these levels. Moderately severe to severe central canal stenosis at both levels is worse at L4-5. IMPRESSION: Defect in the inferior endplate of L4 cannot be definitively characterized but appears chronic and is favored to represent a Schmorl's node rather than a compression fracture. Lumbar spondylosis appears worst at L3-4 and L4-5 where there is moderately severe to severe central canal stenosis, worse at L4-5. Electronically Signed   By: Drusilla Kanner M.D.   On: 12/23/2020 16:26   DG Chest Port 1 View  Result Date: 01/09/2021 CLINICAL DATA:  Pleural effusion. EXAM: PORTABLE CHEST 1 VIEW COMPARISON:  01/07/2021 FINDINGS: Stable cardiomediastinal contours. Moderate to large left pleural effusion and moderate right pleural effusion are unchanged compared with  the previous exam. There is overlying atelectasis. Pulmonary vascular congestion is similar. IMPRESSION: 1. No change in bilateral pleural effusions, left greater than right. 2. Stable pulmonary vascular congestion. Electronically Signed   By: Signa Kell M.D.   On: 01/09/2021 10:38   DG CHEST PORT 1 VIEW  Result Date: 01/07/2021 CLINICAL DATA:  Status post pericardiocentesis. EXAM: PORTABLE CHEST 1 VIEW COMPARISON:  Radiograph yesterday.  CT 01/03/2021 FINDINGS: Enlargement of the cardiopericardial silhouette persists, however slight decreased globular component likely sequela of pericardiocentesis. There is a presumed pericardial drain in place. Persistent bilateral pleural effusions and atelectasis. No pneumothorax. IMPRESSION: 1. Persistent enlargement of the cardiopericardial silhouette, however slight decreased globular appearance likely related to pericardiocentesis. Presumed pericardial drain in place. 2. Persistent bilateral pleural effusions and atelectasis. Electronically Signed   By: Narda Rutherford M.D.   On: 01/07/2021 18:56   DG Chest Port 1 View  Result Date: 01/05/2021 CLINICAL DATA:  Shortness of breath.  Evaluate pleural effusion. EXAM: PORTABLE CHEST 1 VIEW  COMPARISON:  January 03, 2021 FINDINGS: The cardiomediastinal silhouette is stable. No pneumothorax. There is a small layering effusion on the right with underlying opacity. There is a moderate to large effusion on the left which is a little larger in the interval with underlying opacity. Mild interstitial prominence. IMPRESSION: 1. There is a moderate to large pleural effusion on the left which is worse in the interval and a small pleural effusion on the right which was not seen previously. Opacities underlying the effusions may represent atelectasis but are nonspecific, particularly on the left. 2. Increasing interstitial opacities suggestive of pulmonary venous congestion/mild edema. Electronically Signed   By: Gerome Sam  III M.D.   On: 01/05/2021 13:37   DG Chest Portable 1 View  Result Date: 01/03/2021 CLINICAL DATA:  Weakness EXAM: PORTABLE CHEST 1 VIEW COMPARISON:  12/23/2020 FINDINGS: Heart size appears enlarged, although is largely obscured. Atherosclerotic calcification of the aortic knob. Large left pleural effusion with left basilar opacity. Right lung appears clear. No pneumothorax. IMPRESSION: Large left pleural effusion with left basilar opacity, which may represent atelectasis and/or pneumonia. Electronically Signed   By: Duanne Guess D.O.   On: 01/03/2021 10:20   DG Chest Portable 1 View  Result Date: 12/23/2020 CLINICAL DATA:  Shortness of breath. Back pain since exercising 2 days ago. Now central chest pain. History of atrial fibrillation. EXAM: PORTABLE CHEST 1 VIEW COMPARISON:  Oct 04, 2013 FINDINGS: Cardiomegaly. The hila and mediastinum are unchanged. No pneumothorax. The right lung is clear. Opacity in left base is chronic in appearance, consistent with scarring or chronic atelectasis. No acute infiltrate identified. IMPRESSION: Chronic changes in the left base.  No acute abnormalities. Electronically Signed   By: Gerome Sam III M.D   On: 12/23/2020 13:44   ECHOCARDIOGRAM COMPLETE  Result Date: 01/05/2021    ECHOCARDIOGRAM REPORT   Patient Name:   Rebecca Short Date of Exam: 01/04/2021 Medical Rec #:  [id number]     Height:       62.0 in Accession #:    [id number]    Weight:       190.2 lb Date of Birth:  Dec 24, 1940     BSA:          1.871 m Patient Age:    79 years      BP:           112/63 mmHg Patient Gender: F             HR:           89 bpm. Exam Location:  Inpatient Procedure: 2D Echo, Cardiac Doppler and Color Doppler Indications:    I31.3 Pericardial effusion (noninflammatory)  History:        Patient has prior history of Echocardiogram examinations, most                 recent 12/24/2020. Arrythmias:Atrial Fibrillation.  Sonographer:    Eulah Pont RDCS Referring Phys: [id number] Cyndi Bender IMPRESSIONS  1. Left ventricular ejection fraction, by estimation, is 65 to 70%. The left ventricle has normal function. The left ventricle has no regional wall motion abnormalities. There is mild left ventricular hypertrophy. Left ventricular diastolic function could not be evaluated.  2. Right ventricular systolic function is normal. The right ventricular size is normal. There is normal pulmonary artery systolic pressure. The estimated right ventricular systolic pressure is 18.4 mmHg.  3. Effusion is mostly posterior to the LV - measuring up to 3.7 cm. Large pericardial effusion.  The pericardial effusion is circumferential. There is no evidence of cardiac tamponade.  4. The mitral valve is grossly normal. No evidence of mitral valve regurgitation.  5. The aortic valve is tricuspid. Aortic valve regurgitation is not visualized.  6. The inferior vena cava is dilated in size with >50% respiratory variability, suggesting right atrial pressure of 8 mmHg. Comparison(s): Changes from prior study are noted. 12/24/2020: LVEF 50-55%, moderate pericardial effusion up to 1 cm - LV posterior wall, no tamponade. FINDINGS  Left Ventricle: Left ventricular ejection fraction, by estimation, is 65 to 70%. The left ventricle has normal function. The left ventricle has no regional wall motion abnormalities. The left ventricular internal cavity size was normal in size. There is  mild left ventricular hypertrophy. Left ventricular diastolic function could not be evaluated due to atrial fibrillation. Left ventricular diastolic function could not be evaluated. Right Ventricle: The right ventricular size is normal. No increase in right ventricular wall thickness. Right ventricular systolic function is normal. There is normal pulmonary artery systolic pressure. The tricuspid regurgitant velocity is 1.61 m/s, and  with an assumed right atrial pressure of 8 mmHg, the estimated right ventricular systolic pressure is 18.4 mmHg. Left Atrium:  Left atrial size was normal in size. Right Atrium: Right atrial size was normal in size. Pericardium: Effusion is mostly posterior to the LV - measuring up to 3.7 cm. A large pericardial effusion is present. The pericardial effusion is circumferential. There is no evidence of cardiac tamponade. Mitral Valve: The mitral valve is grossly normal. No evidence of mitral valve regurgitation. Tricuspid Valve: The tricuspid valve is not well visualized. Tricuspid valve regurgitation is trivial. Aortic Valve: The aortic valve is tricuspid. Aortic valve regurgitation is not visualized. Pulmonic Valve: The pulmonic valve was grossly normal. Pulmonic valve regurgitation is not visualized. Aorta: The aortic root and ascending aorta are structurally normal, with no evidence of dilitation. Venous: The inferior vena cava is dilated in size with greater than 50% respiratory variability, suggesting right atrial pressure of 8 mmHg. IAS/Shunts: No atrial level shunt detected by color flow Doppler.  LEFT VENTRICLE PLAX 2D LVIDd:         3.90 cm LVIDs:         2.50 cm LV PW:         1.10 cm LV IVS:        1.10 cm LVOT diam:     2.10 cm LV SV:         52 LV SV Index:   28 LVOT Area:     3.46 cm  RIGHT VENTRICLE RV S prime:     9.90 cm/s TAPSE (M-mode): 1.5 cm LEFT ATRIUM             Index       RIGHT ATRIUM           Index LA diam:        3.60 cm 1.92 cm/m  RA Area:     17.90 cm LA Vol (A2C):   42.0 ml 22.44 ml/m RA Volume:   39.60 ml  21.16 ml/m LA Vol (A4C):   42.4 ml 22.66 ml/m LA Biplane Vol: 43.4 ml 23.19 ml/m  AORTIC VALVE LVOT Vmax:   92.00 cm/s LVOT Vmean:  57.300 cm/s LVOT VTI:    0.151 m  AORTA Ao Root diam: 3.10 cm Ao Asc diam:  3.00 cm TRICUSPID VALVE TR Peak grad:   10.4 mmHg TR Vmax:        161.00 cm/s  SHUNTS Systemic VTI:  0.15 m Systemic Diam: 2.10 cm Zoila Shutter MD Electronically signed by Zoila Shutter MD Signature Date/Time: 01/05/2021/12:15:27 PM    Final    ECHOCARDIOGRAM COMPLETE  Result Date: 12/24/2020     ECHOCARDIOGRAM REPORT   Patient Name:   Rebecca Short Date of Exam: 12/24/2020 Medical Rec #:  [id number]     Height:       62.0 in Accession #:    [id number]    Weight:       180.0 lb Date of Birth:  1941-03-10     BSA:          1.828 m Patient Age:    79 years      BP:           115/63 mmHg Patient Gender: F             HR:           91 bpm. Exam Location:  Inpatient Procedure: 2D Echo, Color Doppler, Cardiac Doppler and Intracardiac            Opacification Agent Indications:     Atrial Fibrillation I48.91  History:         Patient has prior history of Echocardiogram examinations, most                  recent 07/15/2013. Arrythmias:Atrial Fibrillation.  Sonographer:     Leta Jungling RDCS Referring Phys:  [id number] Sunny Schlein [facility] Diagnosing Phys: Epifanio Lesches MD  Sonographer Comments: Suboptimal apical window and suboptimal subcostal window. IMPRESSIONS  1. Technically difficult study. Left ventricular ejection fraction, by estimation, is 50 to 55%. The left ventricle has normal function. The left ventricle has no regional wall motion abnormalities. There is mild left ventricular hypertrophy. Left ventricular diastolic parameters are indeterminate.  2. Right ventricular systolic function is normal. The right ventricular size is mildly enlarged. Tricuspid regurgitation signal is inadequate for assessing PA pressure.  3. Moderate pericardial effusion measuring up to 1 cm adjacent to LV posterior wall. No evidence of tamponade  4. The mitral valve is normal in structure. No evidence of mitral valve regurgitation.  5. The aortic valve was not well visualized. Aortic valve regurgitation is not visualized. FINDINGS  Left Ventricle: Left ventricular ejection fraction, by estimation, is 50 to 55%. The left ventricle has low normal function. The left ventricle has no regional wall motion abnormalities. Definity contrast agent was given IV to delineate the left ventricular endocardial borders. The left ventricular  internal cavity size was normal in size. There is mild left ventricular hypertrophy. Left ventricular diastolic parameters are indeterminate. Right Ventricle: The right ventricular size is mildly enlarged. Right vetricular wall thickness was not well visualized. Right ventricular systolic function is normal. Tricuspid regurgitation signal is inadequate for assessing PA pressure. Left Atrium: Left atrial size was not well visualized. Right Atrium: Right atrial size was not well visualized. Pericardium: A moderately sized pericardial effusion is present. Mitral Valve: The mitral valve is normal in structure. No evidence of mitral valve regurgitation. Tricuspid Valve: The tricuspid valve is normal in structure. Tricuspid valve regurgitation is trivial. Aortic Valve: The aortic valve was not well visualized. Aortic valve regurgitation is not visualized. Pulmonic Valve: The pulmonic valve was not well visualized. Pulmonic valve regurgitation is not visualized. Aorta: The aortic root and ascending aorta are structurally normal, with no evidence of dilitation. IAS/Shunts: The interatrial septum was not well visualized.  LEFT VENTRICLE PLAX 2D LVIDd:  4.30 cm LVIDs:         3.80 cm LV PW:         0.80 cm LV IVS:        1.20 cm LVOT diam:     1.60 cm LVOT Area:     2.01 cm  LEFT ATRIUM           Index LA diam:      4.60 cm 2.52 cm/m LA Vol (A4C): 97.8 ml 53.50 ml/m   AORTA Ao Root diam: 2.90 cm Ao Asc diam:  3.20 cm  SHUNTS Systemic Diam: 1.60 cm Epifanio Lesches MD Electronically signed by Epifanio Lesches MD Signature Date/Time: 12/24/2020/8:51:33 AM    Final (Updated)    CT Angio Chest/Abd/Pel for Dissection W and/or Wo Contrast  Result Date: 12/23/2020 CLINICAL DATA:  Abdominal pain, aortic dissection suspected EXAM: CT ANGIOGRAPHY CHEST, ABDOMEN AND PELVIS TECHNIQUE: Non-contrast CT of the chest was initially obtained. Multidetector CT imaging through the chest, abdomen and pelvis was performed using  the standard protocol during bolus administration of intravenous contrast. Multiplanar reconstructed images and MIPs were obtained and reviewed to evaluate the vascular anatomy. CONTRAST:  86mL OMNIPAQUE IOHEXOL 350 MG/ML SOLN COMPARISON:  01/10/2010 FINDINGS: CTA CHEST FINDINGS Cardiovascular: Cardiomegaly. Aorta normal caliber. No dissection. No filling defects in the pulmonary arteries to suggest pulmonary emboli. Mediastinum/Nodes: No mediastinal, hilar, or axillary adenopathy. Trachea and esophagus are unremarkable. Thyroid unremarkable. Lungs/Pleura: Left lower lobe atelectasis or infiltrate/pneumonia. Minimal right base atelectasis. No effusions. Musculoskeletal: Chest wall soft tissues are unremarkable. No acute bony abnormality. Review of the MIP images confirms the above findings. CTA ABDOMEN AND PELVIS FINDINGS VASCULAR Aorta: Scattered infrarenal aortic calcifications. No aneurysm or dissection. Celiac: Patent SMA: Patent Renals: Patent IMA: Patent Inflow: Scattered calcifications.  No aneurysm or dissection. Veins: Choose 1 Review of the MIP images confirms the above findings. NON-VASCULAR Hepatobiliary: Prior cholecystectomy. Prominent common bile duct likely related to post cholecystectomy state and patient's age, measuring up to 18 mm. No intrahepatic biliary ductal dilatation or focal hepatic abnormality. Pancreas: No focal abnormality or ductal dilatation. Spleen: No focal abnormality.  Normal size. Adrenals/Urinary Tract: Small scattered renal cysts. No renal or ureteral stones. No hydronephrosis. Adrenal glands and urinary bladder unremarkable. Stomach/Bowel: Diffuse colonic diverticulosis. Stomach and small bowel decompressed. Lymphatic: No adenopathy Reproductive: Calcified fibroids within the pelvis. No adnexal mass. Other: No free fluid or free air. Musculoskeletal: No acute bony abnormality. Degenerative changes throughout the lumbar spine. Review of the MIP images confirms the above  findings. IMPRESSION: No aortic aneurysm or dissection. No evidence of pulmonary embolus. Cardiomegaly. Right base atelectasis.  Left lower lobe atelectasis or infiltrate. Diffuse colonic diverticulosis.  No active diverticulitis. Uterine fibroids. Electronically Signed   By: Charlett Nose M.D.   On: 12/23/2020 16:38   ECHOCARDIOGRAM LIMITED  Result Date: 01/08/2021    ECHOCARDIOGRAM LIMITED REPORT   Patient Name:   Rebecca Short Date of Exam: 01/08/2021 Medical Rec #:  [id number]     Height:       62.0 in Accession #:    [id number]    Weight:       189.6 lb Date of Birth:  04-06-1941     BSA:          1.869 m Patient Age:    79 years      BP:           140/67 mmHg Patient Gender: F  HR:           85 bpm. Exam Location:  Inpatient Procedure: Limited Echo and Limited Color Doppler Indications:    Pericardial effusion follow up  History:        Patient has prior history of Echocardiogram examinations, most                 recent 01/07/2021. 01/07/21 Pericardiocentesis.  Sonographer:    Neomia Dear RDCS Referring Phys: [id number] CHRISTOPHER END IMPRESSIONS  1. Left ventricular ejection fraction, by estimation, is 60 to 65%. The left ventricle has normal function. The left ventricle has no regional wall motion abnormalities.  2. Right ventricular systolic function is normal. The right ventricular size is normal. There is normal pulmonary artery systolic pressure.  3. Post pericardiocentesis with no reaccumulation Persistent large left pleural effusion . The pericardial effusion is posterior to the left ventricle. Large pleural effusion in the left lateral region.  4. The mitral valve is abnormal. Trivial mitral valve regurgitation. No evidence of mitral stenosis.  5. The aortic valve is tricuspid. There is mild calcification of the aortic valve. Aortic valve regurgitation is not visualized. Mild aortic valve sclerosis is present, with no evidence of aortic valve stenosis.  6. The inferior vena cava is normal in  size with greater than 50% respiratory variability, suggesting right atrial pressure of 3 mmHg. FINDINGS  Left Ventricle: Left ventricular ejection fraction, by estimation, is 60 to 65%. The left ventricle has normal function. The left ventricle has no regional wall motion abnormalities. The left ventricular internal cavity size was normal in size. There is  no left ventricular hypertrophy. Right Ventricle: The right ventricular size is normal. No increase in right ventricular wall thickness. Right ventricular systolic function is normal. There is normal pulmonary artery systolic pressure. The tricuspid regurgitant velocity is 2.40 m/s, and  with an assumed right atrial pressure of 3 mmHg, the estimated right ventricular systolic pressure is 26.0 mmHg. Left Atrium: Left atrial size was normal in size. Right Atrium: Right atrial size was normal in size. Pericardium: Post pericardiocentesis with no reaccumulation Persistent large left pleural effusion. Trivial pericardial effusion is present. The pericardial effusion is posterior to the left ventricle. Mitral Valve: The mitral valve is abnormal. There is mild thickening of the mitral valve leaflet(s). There is mild calcification of the mitral valve leaflet(s). Trivial mitral valve regurgitation. No evidence of mitral valve stenosis. Tricuspid Valve: The tricuspid valve is not assessed. Tricuspid valve regurgitation is not demonstrated. No evidence of tricuspid stenosis. Aortic Valve: The aortic valve is tricuspid. There is mild calcification of the aortic valve. Aortic valve regurgitation is not visualized. Mild aortic valve sclerosis is present, with no evidence of aortic valve stenosis. Pulmonic Valve: The pulmonic valve was not assessed. Pulmonic valve regurgitation is not visualized. No evidence of pulmonic stenosis. Aorta: The aortic root is normal in size and structure. Venous: The inferior vena cava is normal in size with greater than 50% respiratory  variability, suggesting right atrial pressure of 3 mmHg. IAS/Shunts: No atrial level shunt detected by color flow Doppler. Additional Comments: There is a large pleural effusion in the left lateral region. LEFT ATRIUM           Index LA Vol (A4C): 35.3 ml 18.89 ml/m  MV E velocity: 88.70 cm/s  TRICUSPID VALVE                            TR Peak  grad:   23.0 mmHg                            TR Vmax:        240.00 cm/s Charlton Haws MD Electronically signed by Charlton Haws MD Signature Date/Time: 01/08/2021/12:36:44 PM    Final    ECHOCARDIOGRAM LIMITED  Result Date: 01/07/2021    ECHOCARDIOGRAM LIMITED REPORT   Patient Name:   Rebecca Short Date of Exam: 01/07/2021 Medical Rec #:  [id number]     Height:       62.0 in Accession #:    [id number]    Weight:       190.6 lb Date of Birth:  1941-04-11     BSA:          1.873 m Patient Age:    79 years      BP:           142/69 mmHg Patient Gender: F             HR:           129 bpm. Exam Location:  Inpatient Procedure: Limited Echo Indications:    Pericardial effusion                 Pericardiocentesis  History:        Patient has prior history of Echocardiogram examinations, most                 recent 01/06/2021. Arrythmias:Atrial Fibrillation; Risk                 Factors:Sleep Apnea.  Sonographer:    Ross Ludwig RDCS (AE) Referring Phys: [id number] CHRISTOPHER END  Sonographer Comments: Technically difficult study due to poor echo windows. IMPRESSIONS  1. Left ventricular ejection fraction, by estimation, is 65 to 70%. The left ventricle has normal function. Small LV prior to procedure. Post procedure LV size is normal.  2. Large pericardial effusion. The pericardial effusion is anterior to the right ventricle. Through study pericardial effusion moves from large to small. No evidence of RV collapse at end of study. Limited study for pericardiocentesis. Comparison(s): A prior study was performed on 01/06/21. Similar effusion prior to procedure. FINDINGS  Left Ventricle: Left  ventricular ejection fraction, by estimation, is 65 to 70%. The left ventricle has normal function. The left ventricular internal cavity size was small. Pericardium: A large pericardial effusion is present. The pericardial effusion is anterior to the right ventricle. Riley Lam MD Electronically signed by Riley Lam MD Signature Date/Time: 01/07/2021/5:10:12 PM    Final    ECHOCARDIOGRAM LIMITED  Result Date: 01/06/2021    ECHOCARDIOGRAM LIMITED REPORT   Patient Name:   Rebecca Short Date of Exam: 01/06/2021 Medical Rec #:  [id number]     Height:       62.0 in Accession #:    [id number]    Weight:       191.9 lb Date of Birth:  Oct 14, 1940     BSA:          1.878 m Patient Age:    79 years      BP:           94/62 mmHg Patient Gender: F             HR:           97 bpm. Exam Location:  Inpatient Procedure: 2D Echo STAT ECHO Indications:  pericardial effusion  History:        Patient has prior history of Echocardiogram examinations, most                 recent 01/04/2021. Arrythmias:Atrial Fibrillation; Risk                 Factors:Sleep Apnea.  Sonographer:    Delcie Roch RDCS Referring Phys: [phone] KENNETH C HILTY IMPRESSIONS  1. Left ventricular ejection fraction, by estimation, is 65 to 70%. The left ventricle has normal function. The left ventricle has no regional wall motion abnormalities.  2. Right ventricular systolic function is normal. The right ventricular size is mildly enlarged.  3. Large pericardial effusion. The pericardial effusion is circumferential. There is no evidence of cardiac tamponade.  4. The inferior vena cava is normal in size with <50% respiratory variability, suggesting right atrial pressure of 8 mmHg. Comparison(s): Changes from prior study are noted. 01/04/2021: LVEF 65-70%, large pericardial effusion up to 3.7 cm, IVC collapsed, no tamponade physiology. Compared to the prior study 2 days ago, the pericardial effusion is stable - the LV cavity is small and likely  preload dependent -the IVC collapses - there are a few features such as diastolic collapse noted and TV inflow variation, however, I suspect this is more respiratory given her large pleural effusion. FINDINGS  Left Ventricle: Left ventricular ejection fraction, by estimation, is 65 to 70%. The left ventricle has normal function. The left ventricle has no regional wall motion abnormalities. Right Ventricle: The right ventricular size is mildly enlarged. Right ventricular systolic function is normal. Pericardium: A large pericardial effusion is present. The pericardial effusion is circumferential. The pericardial effusion appears to contain focal strands and fibrous material. There is diastolic collapse of the right ventricular free wall and excessive respiratory variation in the tricuspid valve spectral Doppler velocities. There is no evidence of cardiac tamponade. Venous: The inferior vena cava is normal in size with less than 50% respiratory variability, suggesting right atrial pressure of 8 mmHg. Zoila Shutter MD Electronically signed by Zoila Shutter MD Signature Date/Time: 01/06/2021/11:22:08 AM    Final    US THORACENTESIS ASP PLEURAL SPACE W/IMG GUIDE  Result Date: 01/06/2021 INDICATION: Patient with a history of permanent atrial fibrillation admitted for AFib with RVR and found to have bilateral pleural effusions as well as a pericardial effusion. Interventional radiology asked to perform a diagnostic and therapeutic thoracentesis. EXAM: ULTRASOUND GUIDED THORACENTESIS MEDICATIONS: 1% lidocaine 10 mL COMPLICATIONS: None immediate. PROCEDURE: An ultrasound guided thoracentesis was thoroughly discussed with the patient and questions answered. The benefits, risks, alternatives and complications were also discussed. The patient understands and wishes to proceed with the procedure. Written consent was obtained. Ultrasound was performed to localize and mark an adequate pocket of fluid in the left chest. The area  was then prepped and draped in the normal sterile fashion. 1% Lidocaine was used for local anesthesia. Under ultrasound guidance a 6 Fr Safe-T-Centesis catheter was introduced. Thoracentesis was performed. The catheter was removed and a dressing applied. FINDINGS: A total of approximately 950 mL of red/amber fluid was removed. Samples were sent to the laboratory as requested by the clinical team. IMPRESSION: Successful ultrasound guided left thoracentesis yielding 950 mL of pleural fluid. Read by: Alwyn Ren, NP Electronically Signed   By: Gilmer Mor D.O.   On: 01/06/2021 13:39      Subjective: She denies any chest pain or shortness of breath  Discharge Exam: Vitals:   01/11/21 0441 01/11/21 1478  BP: 134/74 126/83  Pulse: 74 87  Resp: 18   Temp: 98.2 F (36.8 C)   SpO2: 97%    Vitals:   01/10/21 2025 01/11/21 0042 01/11/21 0441 01/11/21 0609  BP: 115/86 120/74 134/74 126/83  Pulse: 76 79 74 87  Resp: 18  18   Temp: 98.4 F (36.9 C)  98.2 F (36.8 C)   TempSrc: Oral  Oral   SpO2: 95%  97%   Weight:   83.5 kg   Height:        General: Pt is alert, awake, not in acute distress Cardiovascular: irr irr, S1/S2 +, no rubs, no gallops Respiratory: Diminished at left lung base with some crackles Abdominal: Soft, NT, ND, bowel sounds + Extremities: no edema, no cyanosis    The results of significant diagnostics from this hospitalization (including imaging, microbiology, ancillary and laboratory) are listed below for reference.     Microbiology: Recent Results (from the past 240 hour(s))  Resp Panel by RT-PCR (Flu A&B, Covid) Nasopharyngeal Swab     Status: None   Collection Time: 01/03/21 10:14 AM   Specimen: Nasopharyngeal Swab; Nasopharyngeal(NP) swabs in vial transport medium  Result Value Ref Range Status   SARS Coronavirus 2 by RT PCR NEGATIVE NEGATIVE Final    Comment: (NOTE) SARS-CoV-2 target nucleic acids are NOT DETECTED.  The SARS-CoV-2 RNA is generally  detectable in upper respiratory specimens during the acute phase of infection. The lowest concentration of SARS-CoV-2 viral copies this assay can detect is 138 copies/mL. A negative result does not preclude SARS-Cov-2 infection and should not be used as the sole basis for treatment or other patient management decisions. A negative result may occur with  improper specimen collection/handling, submission of specimen other than nasopharyngeal swab, presence of viral mutation(s) within the areas targeted by this assay, and inadequate number of viral copies(<138 copies/mL). A negative result must be combined with clinical observations, patient history, and epidemiological information. The expected result is Negative.  Fact Sheet for Patients:  BloggerCourse.com  Fact Sheet for Healthcare Providers:  SeriousBroker.it  This test is no t yet approved or cleared by the [place] FDA and  has been authorized for detection and/or diagnosis of SARS-CoV-2 by FDA under an Emergency Use Authorization (EUA). This EUA will remain  in effect (meaning this test can be used) for the duration of the COVID-19 declaration under Section 564(b)(1) of the Act, 21 U.S.C.section 360bbb-3(b)(1), unless the authorization is terminated  or revoked sooner.       Influenza A by PCR NEGATIVE NEGATIVE Final   Influenza B by PCR NEGATIVE NEGATIVE Final    Comment: (NOTE) The Xpert Xpress SARS-CoV-2/FLU/RSV plus assay is intended as an aid in the diagnosis of influenza from Nasopharyngeal swab specimens and should not be used as a sole basis for treatment. Nasal washings and aspirates are unacceptable for Xpert Xpress SARS-CoV-2/FLU/RSV testing.  Fact Sheet for Patients: BloggerCourse.com  Fact Sheet for Healthcare Providers: SeriousBroker.it  This test is not yet approved or cleared by the [place] FDA  and has been authorized for detection and/or diagnosis of SARS-CoV-2 by FDA under an Emergency Use Authorization (EUA). This EUA will remain in effect (meaning this test can be used) for the duration of the COVID-19 declaration under Section 564(b)(1) of the Act, 21 U.S.C. section 360bbb-3(b)(1), unless the authorization is terminated or revoked.  Performed at [facility], [address] [place], [place] [address]   Body fluid culture w Gram Stain  Status: None   Collection Time: 01/06/21  1:07 PM   Specimen: Lung, Left; Pleural Fluid  Result Value Ref Range Status   Specimen Description PLEURAL FLUID  Final   Special Requests LUNG RIGHT  Final   Gram Stain   Final    RARE WBC PRESENT,BOTH PMN AND MONONUCLEAR NO ORGANISMS SEEN    Culture   Final    NO GROWTH Performed at [facility], [address] [place], [place] [address]    Report Status 01/09/2021 FINAL  Final  Body fluid culture w Gram Stain     Status: None   Collection Time: 01/07/21  4:04 PM   Specimen: PATH Cytology Misc. fluid; Body Fluid  Result Value Ref Range Status   Specimen Description FLUID PERICARDIAL  Final   Special Requests NONE  Final   Gram Stain   Final    NO SQUAMOUS EPITHELIAL CELLS SEEN ABUNDANT WBC PRESENT, PREDOMINANTLY MONONUCLEAR NO ORGANISMS SEEN    Culture   Final    NO GROWTH Performed at [facility], [address] [place], [place] [address]    Report Status 01/11/2021 FINAL  Final  MRSA Next Gen by PCR, Nasal     Status: None   Collection Time: 01/07/21  6:13 PM   Specimen: Nasal Mucosa; Nasal Swab  Result Value Ref Range Status   MRSA by PCR Next Gen NOT DETECTED NOT DETECTED Final    Comment: (NOTE) The GeneXpert MRSA Assay (FDA approved for NASAL specimens only), is one component of a comprehensive MRSA colonization surveillance program. It is not intended to diagnose MRSA infection nor to guide or monitor treatment for MRSA infections. Test  performance is not FDA approved in patients less than 73 years old. Performed at [facility], [address], [place], [place] [address]      Labs: BNP (last 3 results) Recent Labs    12/23/20 1335 01/03/21 0951  BNP 190.9* 108.1*   Basic Metabolic Panel: Recent Labs  Lab 01/06/21 0220 01/07/21 0158 01/08/21 0053 01/09/21 0407 01/10/21 0125 01/11/21 0828  NA 134* 136 137 138 140 142  K 4.0 4.0 3.3* 4.5 3.6 3.5  CL 103 106 108 112* 109 109  CO2 22 22 21* 20* 23 23  GLUCOSE 136* 110* 115* 95 114* 133*  BUN 24* 32* 32* 35* 35* 38*  CREATININE 1.03* 1.38* 1.37* 1.09* 1.36* 1.21*  CALCIUM 8.2* 8.1* 8.1* 8.4* 8.4* 8.7*  MG 2.0 1.9 1.9 2.2 1.8  --    Liver Function Tests: Recent Labs  Lab 01/06/21 1359  PROT 5.2*   No results for input(s): LIPASE, AMYLASE in the last 168 hours. No results for input(s): AMMONIA in the last 168 hours. CBC: Recent Labs  Lab 01/07/21 0158 01/08/21 0053 01/09/21 0222 01/10/21 0125 01/11/21 0129  WBC 10.5 7.3 6.5 5.3 5.9  HGB 12.8 12.4 12.8 12.8 12.7  HCT 38.1 37.4 37.4 39.4 39.3  MCV 88.6 88.4 88.6 89.5 89.7  PLT 319 309 384 333 332   Cardiac Enzymes: No results for input(s): CKTOTAL, CKMB, CKMBINDEX, TROPONINI in the last 168 hours. BNP: Invalid input(s): POCBNP CBG: Recent Labs  Lab 01/07/21 2138 01/08/21 0812 01/08/21 1135 01/08/21 1609 01/09/21 0833  GLUCAP 136* 81 140* 85 104*   D-Dimer No results for input(s): DDIMER in the last 72 hours. Hgb A1c No results for input(s): HGBA1C in the last 72 hours. Lipid Profile No results for input(s): CHOL, HDL, LDLCALC, TRIG, CHOLHDL, LDLDIRECT  in the last 72 hours. Thyroid function studies No results for input(s): TSH, T4TOTAL, T3FREE, THYROIDAB in the last 72 hours.  Invalid input(s): FREET3 Anemia work up No results for input(s): VITAMINB12, FOLATE, FERRITIN, TIBC, IRON, RETICCTPCT in the last 72 hours. Urinalysis    Component Value Date/Time   COLORURINE  YELLOW 01/03/2021 1810   APPEARANCEUR HAZY (A) 01/03/2021 1810   LABSPEC 1.013 01/03/2021 1810   PHURINE 6.0 01/03/2021 1810   GLUCOSEU NEGATIVE 01/03/2021 1810   HGBUR NEGATIVE 01/03/2021 1810   BILIRUBINUR NEGATIVE 01/03/2021 1810   KETONESUR 20 (A) 01/03/2021 1810   PROTEINUR NEGATIVE 01/03/2021 1810   UROBILINOGEN 0.2 07/15/2013 1004   NITRITE NEGATIVE 01/03/2021 1810   LEUKOCYTESUR TRACE (A) 01/03/2021 1810   Sepsis Labs Invalid input(s): PROCALCITONIN,  WBC,  LACTICIDVEN Microbiology Recent Results (from the past 240 hour(s))  Resp Panel by RT-PCR (Flu A&B, Covid) Nasopharyngeal Swab     Status: None   Collection Time: 01/03/21 10:14 AM   Specimen: Nasopharyngeal Swab; Nasopharyngeal(NP) swabs in vial transport medium  Result Value Ref Range Status   SARS Coronavirus 2 by RT PCR NEGATIVE NEGATIVE Final    Comment: (NOTE) SARS-CoV-2 target nucleic acids are NOT DETECTED.  The SARS-CoV-2 RNA is generally detectable in upper respiratory specimens during the acute phase of infection. The lowest concentration of SARS-CoV-2 viral copies this assay can detect is 138 copies/mL. A negative result does not preclude SARS-Cov-2 infection and should not be used as the sole basis for treatment or other patient management decisions. A negative result may occur with  improper specimen collection/handling, submission of specimen other than nasopharyngeal swab, presence of viral mutation(s) within the areas targeted by this assay, and inadequate number of viral copies(<138 copies/mL). A negative result must be combined with clinical observations, patient history, and epidemiological information. The expected result is Negative.  Fact Sheet for Patients:  BloggerCourse.com  Fact Sheet for Healthcare Providers:  SeriousBroker.it  This test is no t yet approved or cleared by the [place] FDA and  has been authorized for detection  and/or diagnosis of SARS-CoV-2 by FDA under an Emergency Use Authorization (EUA). This EUA will remain  in effect (meaning this test can be used) for the duration of the COVID-19 declaration under Section 564(b)(1) of the Act, 21 U.S.C.section 360bbb-3(b)(1), unless the authorization is terminated  or revoked sooner.       Influenza A by PCR NEGATIVE NEGATIVE Final   Influenza B by PCR NEGATIVE NEGATIVE Final    Comment: (NOTE) The Xpert Xpress SARS-CoV-2/FLU/RSV plus assay is intended as an aid in the diagnosis of influenza from Nasopharyngeal swab specimens and should not be used as a sole basis for treatment. Nasal washings and aspirates are unacceptable for Xpert Xpress SARS-CoV-2/FLU/RSV testing.  Fact Sheet for Patients: BloggerCourse.com  Fact Sheet for Healthcare Providers: SeriousBroker.it  This test is not yet approved or cleared by the [place] FDA and has been authorized for detection and/or diagnosis of SARS-CoV-2 by FDA under an Emergency Use Authorization (EUA). This EUA will remain in effect (meaning this test can be used) for the duration of the COVID-19 declaration under Section 564(b)(1) of the Act, 21 U.S.C. section 360bbb-3(b)(1), unless the authorization is terminated or revoked.  Performed at [facility], [address], [place], [place] [address]   Body fluid culture w Gram Stain     Status: None   Collection Time: 01/06/21  1:07 PM   Specimen: Lung, Left; Pleural Fluid  Result  Value Ref Range Status   Specimen Description PLEURAL FLUID  Final   Special Requests LUNG RIGHT  Final   Gram Stain   Final    RARE WBC PRESENT,BOTH PMN AND MONONUCLEAR NO ORGANISMS SEEN    Culture   Final    NO GROWTH Performed at [facility], [address] [place], [place] [address]    Report Status 01/09/2021 FINAL  Final  Body fluid culture w Gram Stain     Status: None   Collection Time:  01/07/21  4:04 PM   Specimen: PATH Cytology Misc. fluid; Body Fluid  Result Value Ref Range Status   Specimen Description FLUID PERICARDIAL  Final   Special Requests NONE  Final   Gram Stain   Final    NO SQUAMOUS EPITHELIAL CELLS SEEN ABUNDANT WBC PRESENT, PREDOMINANTLY MONONUCLEAR NO ORGANISMS SEEN    Culture   Final    NO GROWTH Performed at [facility], [address], [place], [place] [address]    Report Status 01/11/2021 FINAL  Final  MRSA Next Gen by PCR, Nasal     Status: None   Collection Time: 01/07/21  6:13 PM   Specimen: Nasal Mucosa; Nasal Swab  Result Value Ref Range Status   MRSA by PCR Next Gen NOT DETECTED NOT DETECTED Final    Comment: (NOTE) The GeneXpert MRSA Assay (FDA approved for NASAL specimens only), is one component of a comprehensive MRSA colonization surveillance program. It is not intended to diagnose MRSA infection nor to guide or monitor treatment for MRSA infections. Test performance is not FDA approved in patients less than 46 years old. Performed at [facility], [address], [place], [place] [address]      Time coordinating discharge: Over 30 minutes  SIGNED:   Huey Bienenstock, MD  [facility] 01/11/2021, 4:18 PM Pager   If 7PM-7AM, please contact night-coverage [email or web address] Password [email or web address]

## 2021-01-11 NOTE — Discharge Instructions (Signed)
Follow with Primary MD Daisy Floro, MD in 7 days   Get CBC, CMP, 2 view Chest X ray checked  by Primary MD next visit.    Activity: As tolerated with Full fall precautions use walker/cane & assistance as needed   Disposition Home    Diet: Heart Healthy  , with feeding assistance and aspiration precautions.  For Heart failure patients - Check your Weight same time everyday, if you gain over 2 pounds, or you develop in leg swelling, experience more shortness of breath or chest pain, call your Primary MD immediately. Follow Cardiac Low Salt Diet and 1.5 lit/day fluid restriction.   On your next visit with your primary care physician please Get Medicines reviewed and adjusted.   Please request your Prim.MD to go over all Hospital Tests and Procedure/Radiological results at the follow up, please get all Hospital records sent to your Prim MD by signing hospital release before you go home.   If you experience worsening of your admission symptoms, develop shortness of breath, life threatening emergency, suicidal or homicidal thoughts you must seek medical attention immediately by calling 911 or calling your MD immediately  if symptoms less severe.  You Must read complete instructions/literature along with all the possible adverse reactions/side effects for all the Medicines you take and that have been prescribed to you. Take any new Medicines after you have completely understood and accpet all the possible adverse reactions/side effects.   Do not drive, operating heavy machinery, perform activities at heights, swimming or participation in water activities or provide baby sitting services if your were admitted for syncope or siezures until you have seen by Primary MD or a Neurologist and advised to do so again.  Do not drive when taking Pain medications.    Do not take more than prescribed Pain, Sleep and Anxiety Medications  Special Instructions: If you have smoked or chewed Tobacco   in the last 2 yrs please stop smoking, stop any regular Alcohol  and or any Recreational drug use.  Wear Seat belts while driving.   Please note  You were cared for by a hospitalist during your hospital stay. If you have any questions about your discharge medications or the care you received while you were in the hospital after you are discharged, you can call the unit and asked to speak with the hospitalist on call if the hospitalist that took care of you is not available. Once you are discharged, your primary care physician will handle any further medical issues. Please note that NO REFILLS for any discharge medications will be authorized once you are discharged, as it is imperative that you return to your primary care physician (or establish a relationship with a primary care physician if you do not have one) for your aftercare needs so that they can reassess your need for medications and monitor your lab values.

## 2021-01-11 NOTE — TOC Transition Note (Signed)
Transition of Care (TOC) - CM/SW Discharge Note Donn Pierini RN, BSN Transitions of Care Unit 4E- RN Case Manager See Treatment Team for direct phone #  Cross coverage for 6E  Patient Details  Name: Rebecca Short MRN: [id number] Date of Birth: 1940-08-16  Transition of Care [facility] CM/SW Contact:  Darrold Span, RN Phone Number: 01/11/2021, 12:27 PM   Clinical Narrative:    Pt stable for transition home today, order place for HHPT, CM in to speak with pt and spouse at the bedside.  Discussed HH recommendation- pt agreeable and list provided for choice Per CMS guidelines from medicare.gov website with star ratings (copy placed in shadow chart). After review of list pt has selected [facility] for [facility] needs.  Pt reports she has needed DME at home that includes, RW, cane, BSC, grabber/reacher. - no other DME needs noted.   Spouse to transport home Address, phone # and PCP all confirmed in epic.   Call made to Amy with [facility] for HHPT referral- referral has been accepted- they will plan to do soc tomorrow.    Final next level of care: Home w Home Health Services Barriers to Discharge: No Barriers Identified   Patient Goals and CMS Choice Patient states their goals for this hospitalization and ongoing recovery are:: return home CMS Medicare.gov Compare Post Acute Care list provided to:: Patient Choice offered to / list presented to : Patient  Discharge Placement               Home with [facility]        Discharge Plan and Services In-house Referral: NA Discharge Planning Services: CM Consult Post Acute Care Choice: Home Health          DME Arranged: N/A DME Agency: NA       HH Arranged: PT HH Agency: [facility] Date [facility] Agency Contacted: 01/11/21 Time HH Agency Contacted: 1226 Representative spoke with at [facility] Agency: Amy  Social Determinants of Health (SDOH) Interventions     Readmission Risk Interventions Readmission Risk Prevention Plan  01/11/2021  Post Dischage Appt Complete  Medication Screening Complete  Transportation Screening Complete  Some recent data might be hidden

## 2021-01-11 NOTE — Progress Notes (Addendum)
Progress Note  Patient Name: Rebecca Short Date of Encounter: 01/11/2021  [facility] Cardiologist: [name], MD   Subjective   Feeling well.  Inpatient Medications    Scheduled Meds:  apixaban  5 mg Oral BID   chlorhexidine  15 mL Mouth Rinse BID   Chlorhexidine Gluconate Cloth  6 each Topical Daily   colchicine  0.6 mg Oral BID   guaiFENesin  600 mg Oral BID   ibuprofen  600 mg Oral BID   metoprolol tartrate  25 mg Oral Q6H   pantoprazole  40 mg Oral Daily   sodium chloride flush  3 mL Intravenous Q12H   Continuous Infusions:  sodium chloride Stopped (01/08/21 1938)   PRN Meds: sodium chloride, acetaminophen **OR** acetaminophen, albuterol, guaiFENesin-dextromethorphan, HYDROcodone-acetaminophen, ondansetron **OR** ondansetron (ZOFRAN) IV, polyvinyl alcohol   Vital Signs    Vitals:   01/10/21 2025 01/11/21 0042 01/11/21 0441 01/11/21 0609  BP: 115/86 120/74 134/74 126/83  Pulse: 76 79 74 87  Resp: 18  18   Temp: 98.4 F (36.9 C)  98.2 F (36.8 C)   TempSrc: Oral  Oral   SpO2: 95%  97%   Weight:   83.5 kg   Height:        Intake/Output Summary (Last 24 hours) at 01/11/2021 0855 Last data filed at 01/11/2021 0452 Gross per 24 hour  Intake 960 ml  Output 1800 ml  Net -840 ml   Last 3 Weights 01/11/2021 01/10/2021 01/09/2021  Weight (lbs) 184 lb 1.6 oz 187 lb 9.8 oz 188 lb 11.4 oz  Weight (kg) 83.507 kg 85.1 kg 85.6 kg      Telemetry    Afib in the 80s - Personally Reviewed  ECG    No new tracings - Personally Reviewed  Physical Exam   GEN: No acute distress.   Neck: No JVD Cardiac: irregular rhythm, regular rate Respiratory: crackles in left base GI: Soft, nontender, non-distended  MS: No edema; No deformity. Neuro:  Nonfocal  Psych: Normal affect   Labs    High Sensitivity Troponin:   Recent Labs  Lab 12/23/20 1334 12/23/20 1513 01/03/21 0951 01/03/21 1338  TROPONINIHS 2 2 7 5       Chemistry Recent Labs  Lab  01/06/21 1359 01/07/21 0158 01/08/21 0053 01/09/21 0407 01/10/21 0125  NA  --    < > 137 138 140  K  --    < > 3.3* 4.5 3.6  CL  --    < > 108 112* 109  CO2  --    < > 21* 20* 23  GLUCOSE  --    < > 115* 95 114*  BUN  --    < > 32* 35* 35*  CREATININE  --    < > 1.37* 1.09* 1.36*  CALCIUM  --    < > 8.1* 8.4* 8.4*  PROT 5.2*  --   --   --   --   GFRNONAA  --    < > 39* 51* 39*  ANIONGAP  --    < > 8 6 8    < > = values in this interval not displayed.     Hematology Recent Labs  Lab 01/09/21 0222 01/10/21 0125 01/11/21 0129  WBC 6.5 5.3 5.9  RBC 4.22 4.40 4.38  HGB 12.8 12.8 12.7  HCT 37.4 39.4 39.3  MCV 88.6 89.5 89.7  MCH 30.3 29.1 29.0  MCHC 34.2 32.5 32.3  RDW 14.6 14.3 14.2  PLT  384 333 332    BNPNo results for input(s): BNP, PROBNP in the last 168 hours.   DDimer No results for input(s): DDIMER in the last 168 hours.   Radiology    No results found.  Cardiac Studies   Pericardiocentesis 01/07/21: Conclusions: Successful pericardiocentesis and pericardial drain placement via the subxiphoid approach, yielding 570 mL of serosanguinous fluid.  Patient Profile     80 y.o. female with permanent atrial fibrillation, macular degeneration, OSA on CPAP, obesity, chronic back pain, moderate pericardial effusion, who is being seen 01/03/2021 for the evaluation of A. fib with RVR, worsening edema, exertional dyspnea and weight gain, suspected LLL pneumonia. A left pleural effusion is also seen.   S/P left thoracentesis 01/06/21 with 950 cc removed S/P pericardiocentesis 01/07/21 with 570 cc removed, catheter removed 01/08/21  Assessment & Plan    Afib with RVR - remains in rate controlled Afib - digoxin has been D/C'ed  - consolidate metoprolol and increase to 50 mg BID   Need for chronic anticoagulation - eliquis restarted per primary   Pericardial effusion - s/p pericardiocentesis - follow up echo 01/08/21 with re-no accumulation  - question pericarditis -  remains on 600 mg ibuprofen and colchicine  - D/C ibuprofen - discuss with attending regarding colchicine   Left pleural effusion  LLL PNA - thoracentesis completed - completed 5 days of ABX    Anticipate discharge today. Follow up has been arranged.      For questions or updates, please contact [facility] Please consult [email or web address] for contact info under        Signed, Marcelino Duster, PA  01/11/2021, 8:55 AM    I have examined the patient and reviewed assessment and plan and discussed with patient.  Agree with above as stated.  Rate controlled.  Walked with PT without any issues.  She wants to go home and is fine trying to do home health PT.  Metoprolol 50 mg p.o. twice daily for rate control in the setting of A. fib.  No need for digoxin at this time.  Eliquis for stroke prevention. Lasix 40 mg daily to help reduce her pleural effusions. No need for NSAIDs at this point. Increase activity gradually.  Okay to discharge from a cardiac standpoint.  [name]

## 2021-01-18 ENCOUNTER — Telehealth: Payer: Self-pay | Admitting: Interventional Cardiology

## 2021-01-18 ENCOUNTER — Other Ambulatory Visit: Payer: Self-pay | Admitting: Family Medicine

## 2021-01-18 ENCOUNTER — Ambulatory Visit
Admission: RE | Admit: 2021-01-18 | Discharge: 2021-01-18 | Disposition: A | Discharge status: Home / Self Care | Payer: Medicare Other | Source: Ambulatory Visit | Attending: Family Medicine | Admitting: Family Medicine

## 2021-01-18 DIAGNOSIS — J189 Pneumonia, unspecified organism: Secondary | ICD-10-CM

## 2021-01-18 DIAGNOSIS — I3139 Other pericardial effusion (noninflammatory): Secondary | ICD-10-CM

## 2021-01-18 MED ORDER — METOPROLOL TARTRATE 25 MG PO TABS: 25.0000 mg | ORAL_TABLET | Freq: Two times a day (BID) | ORAL | 3 refills | Status: DC

## 2021-01-18 NOTE — Addendum Note (Signed)
Addended by: Bertram Millard on: 01/18/2021 03:44 PM   Modules accepted: Orders

## 2021-01-18 NOTE — Telephone Encounter (Signed)
LMTCB

## 2021-01-18 NOTE — Telephone Encounter (Signed)
Can also go back to metoprolol 25 mg BID starting tomorrow.

## 2021-01-18 NOTE — Telephone Encounter (Signed)
Pt advised Dr. Hoyle Barr recommendation for metoprolol 25 mg bid starting tomorrow and she says her heart rate is still fluctuating but better between 75-90... her BP systolic at her pcp office was 110 she cannot remember the diastolic... she will let us know if she has further questions or problems.

## 2021-01-18 NOTE — Telephone Encounter (Signed)
Pt c/o BP issue: STAT if pt c/o blurred vision, one-sided weakness or slurred speech  1. What are your last 5 BP readings?  01/18/21: 91/72  2. Are you having any other symptoms (ex. Dizziness, headache, blurred vision, passed out)?  Afib   3. What is your BP issue?   Neighbor called in with patient on the phone. She states she was recently discharged and her BP has been low lately.

## 2021-01-18 NOTE — Telephone Encounter (Signed)
Called pt in regards to BP.  Pt verified name and DOB.  She gave me verbal consent to speak with neighbor Caesar Chestnut.  Ruby reports that pt called her this am c/o feeling weak.  BP 91/72 HR 90's-103 highest, O2 sat 95%.  She reports pt breathing was heavy upon arrival but returned to normal once she checked her VS and comforted her.  Pt denies difficulty taking a deep breath, does have a nonproductive cough per pt she recently had CAP.  Pt hospitalized for pericardial effusion, pleural effusion and CAP released on 01/11/21.  Pt concerned that her BP is low.  I informed pt that while BP could be higher it's not at a dangerous level as of now.  I encouraged her to increase fluid intake and not to take morning dose of metoprolol 50 mg.  Ruby reports pt has a OV with PCP today at 11:30 am and wonders if she should cancel.  I told her to keep the appointment if pt is able to ambulate without difficulty breathing.  If pt develops difficulty breathing or HR increases above 120 and BP remains low to call 911 to have her evaluated. She verbalizes understanding and reports that pt will have a CXR and labs drawn at OV today.  I advised her to keep appointment and call back if she has any further needs.

## 2021-01-19 ENCOUNTER — Inpatient Hospital Stay (HOSPITAL_COMMUNITY)
Admission: EM | Admit: 2021-01-19 | Discharge: 2021-01-30 | DRG: 309 | Disposition: A | Discharge status: Home / Self Care | Payer: Medicare Other | Attending: Internal Medicine | Admitting: Internal Medicine

## 2021-01-19 ENCOUNTER — Telehealth: Payer: Self-pay | Admitting: Physician Assistant

## 2021-01-19 ENCOUNTER — Emergency Department (HOSPITAL_COMMUNITY): Payer: Medicare Other

## 2021-01-19 ENCOUNTER — Encounter (HOSPITAL_COMMUNITY): Payer: Self-pay

## 2021-01-19 DIAGNOSIS — Z8262 Family history of osteoporosis: Secondary | ICD-10-CM

## 2021-01-19 DIAGNOSIS — Z79899 Other long term (current) drug therapy: Secondary | ICD-10-CM

## 2021-01-19 DIAGNOSIS — Z96653 Presence of artificial knee joint, bilateral: Secondary | ICD-10-CM | POA: Diagnosis present

## 2021-01-19 DIAGNOSIS — Z833 Family history of diabetes mellitus: Secondary | ICD-10-CM

## 2021-01-19 DIAGNOSIS — Z888 Allergy status to other drugs, medicaments and biological substances status: Secondary | ICD-10-CM

## 2021-01-19 DIAGNOSIS — J90 Pleural effusion, not elsewhere classified: Secondary | ICD-10-CM

## 2021-01-19 DIAGNOSIS — Z7901 Long term (current) use of anticoagulants: Secondary | ICD-10-CM | POA: Diagnosis not present

## 2021-01-19 DIAGNOSIS — D72829 Elevated white blood cell count, unspecified: Secondary | ICD-10-CM | POA: Diagnosis present

## 2021-01-19 DIAGNOSIS — E876 Hypokalemia: Secondary | ICD-10-CM | POA: Diagnosis present

## 2021-01-19 DIAGNOSIS — Z881 Allergy status to other antibiotic agents status: Secondary | ICD-10-CM

## 2021-01-19 DIAGNOSIS — Z6832 Body mass index (BMI) 32.0-32.9, adult: Secondary | ICD-10-CM

## 2021-01-19 DIAGNOSIS — I4891 Unspecified atrial fibrillation: Secondary | ICD-10-CM | POA: Diagnosis present

## 2021-01-19 DIAGNOSIS — I313 Pericardial effusion (noninflammatory): Secondary | ICD-10-CM | POA: Diagnosis present

## 2021-01-19 DIAGNOSIS — Z9889 Other specified postprocedural states: Secondary | ICD-10-CM

## 2021-01-19 DIAGNOSIS — Z9049 Acquired absence of other specified parts of digestive tract: Secondary | ICD-10-CM

## 2021-01-19 DIAGNOSIS — I4821 Permanent atrial fibrillation: Principal | ICD-10-CM | POA: Diagnosis present

## 2021-01-19 DIAGNOSIS — Z809 Family history of malignant neoplasm, unspecified: Secondary | ICD-10-CM

## 2021-01-19 DIAGNOSIS — I428 Other cardiomyopathies: Secondary | ICD-10-CM | POA: Diagnosis present

## 2021-01-19 DIAGNOSIS — G4733 Obstructive sleep apnea (adult) (pediatric): Secondary | ICD-10-CM | POA: Diagnosis not present

## 2021-01-19 DIAGNOSIS — Z841 Family history of disorders of kidney and ureter: Secondary | ICD-10-CM

## 2021-01-19 DIAGNOSIS — E669 Obesity, unspecified: Secondary | ICD-10-CM | POA: Diagnosis present

## 2021-01-19 DIAGNOSIS — R0600 Dyspnea, unspecified: Secondary | ICD-10-CM

## 2021-01-19 DIAGNOSIS — Z20822 Contact with and (suspected) exposure to covid-19: Secondary | ICD-10-CM | POA: Diagnosis present

## 2021-01-19 DIAGNOSIS — I959 Hypotension, unspecified: Secondary | ICD-10-CM | POA: Diagnosis not present

## 2021-01-19 DIAGNOSIS — Z823 Family history of stroke: Secondary | ICD-10-CM

## 2021-01-19 DIAGNOSIS — I3139 Other pericardial effusion (noninflammatory): Secondary | ICD-10-CM | POA: Diagnosis present

## 2021-01-19 LAB — COMPREHENSIVE METABOLIC PANEL
ALT: 31 U/L (ref 0–44)
AST: 26 U/L (ref 15–41)
Albumin: 2.8 g/dL — ABNORMAL LOW (ref 3.5–5.0)
Alkaline Phosphatase: 88 U/L (ref 38–126)
Anion gap: 11 (ref 5–15)
BUN: 13 mg/dL (ref 8–23)
CO2: 28 mmol/L (ref 22–32)
Calcium: 8.4 mg/dL — ABNORMAL LOW (ref 8.9–10.3)
Chloride: 95 mmol/L — ABNORMAL LOW (ref 98–111)
Creatinine, Ser: 0.81 mg/dL (ref 0.44–1.00)
GFR, Estimated: >60 mL/min (ref >60)
Glucose, Bld: 118 mg/dL — ABNORMAL HIGH (ref 70–99)
Potassium: 3.2 mmol/L — ABNORMAL LOW (ref 3.5–5.1)
Sodium: 134 mmol/L — ABNORMAL LOW (ref 135–145)
Total Bilirubin: 1.1 mg/dL (ref 0.3–1.2)
Total Protein: 6 g/dL — ABNORMAL LOW (ref 6.5–8.1)

## 2021-01-19 LAB — CBC WITH DIFFERENTIAL/PLATELET
Abs Immature Granulocytes: 0.11 10*3/uL — ABNORMAL HIGH (ref 0.00–0.07)
Basophils Absolute: 0 10*3/uL (ref 0.0–0.1)
Basophils Relative: 0 %
Eosinophils Absolute: 0 10*3/uL (ref 0.0–0.5)
Eosinophils Relative: 0 %
HCT: 45.2 % (ref 36.0–46.0)
Hemoglobin: 14.8 g/dL (ref 12.0–15.0)
Immature Granulocytes: 1 %
Lymphocytes Relative: 8 %
Lymphs Abs: 1.2 10*3/uL (ref 0.7–4.0)
MCH: 29.2 pg (ref 26.0–34.0)
MCHC: 32.7 g/dL (ref 30.0–36.0)
MCV: 89.3 fL (ref 80.0–100.0)
Monocytes Absolute: 1 10*3/uL (ref 0.1–1.0)
Monocytes Relative: 7 %
Neutro Abs: 13 10*3/uL — ABNORMAL HIGH (ref 1.7–7.7)
Neutrophils Relative %: 84 %
Platelets: 241 10*3/uL (ref 150–400)
RBC: 5.06 MIL/uL (ref 3.87–5.11)
RDW: 14.2 % (ref 11.5–15.5)
WBC: 15.3 10*3/uL — ABNORMAL HIGH (ref 4.0–10.5)
nRBC: 0 % (ref 0.0–0.2)

## 2021-01-19 LAB — LACTIC ACID, PLASMA: Lactic Acid, Venous: 1.6 mmol/L (ref 0.5–1.9)

## 2021-01-19 LAB — BRAIN NATRIURETIC PEPTIDE: B Natriuretic Peptide: 102.3 pg/mL — ABNORMAL HIGH (ref 0.0–100.0)

## 2021-01-19 LAB — RESP PANEL BY RT-PCR (FLU A&B, COVID) ARPGX2
Influenza A by PCR: NEGATIVE
Influenza B by PCR: NEGATIVE
SARS Coronavirus 2 by RT PCR: NEGATIVE

## 2021-01-19 LAB — CBG MONITORING, ED: Glucose-Capillary: 122 mg/dL — ABNORMAL HIGH (ref 70–99)

## 2021-01-19 MED ORDER — POLYVINYL ALCOHOL 1.4 % OP SOLN
1.0000 [drp] | Freq: Two times a day (BID) | OPHTHALMIC | Status: DC
  Administered 2021-01-20 – 2021-01-30 (×16): 1 [drp] via OPHTHALMIC
  Filled 2021-01-19 (×2): qty 15

## 2021-01-19 MED ORDER — VANCOMYCIN HCL IN DEXTROSE 1-5 GM/200ML-% IV SOLN: 1000.0000 mg | INTRAVENOUS | Status: DC

## 2021-01-19 MED ORDER — APIXABAN 5 MG PO TABS
5.0000 mg | ORAL_TABLET | Freq: Two times a day (BID) | ORAL | Status: DC
  Administered 2021-01-19 – 2021-01-22 (×6): 5 mg via ORAL
  Filled 2021-01-19 (×6): qty 1

## 2021-01-19 MED ORDER — VANCOMYCIN HCL 1500 MG/300ML IV SOLN
1500.0000 mg | Freq: Once | INTRAVENOUS | Status: AC
  Administered 2021-01-19: 1500 mg via INTRAVENOUS
  Filled 2021-01-19: qty 300

## 2021-01-19 MED ORDER — SODIUM CHLORIDE 0.9 % IV SOLN
2.0000 g | Freq: Two times a day (BID) | INTRAVENOUS | Status: AC
  Administered 2021-01-20 – 2021-01-24 (×10): 2 g via INTRAVENOUS
  Filled 2021-01-19 (×10): qty 2

## 2021-01-19 MED ORDER — ACETAMINOPHEN 325 MG PO TABS
650.0000 mg | ORAL_TABLET | Freq: Four times a day (QID) | ORAL | Status: DC | PRN
  Administered 2021-01-20 – 2021-01-23 (×14): 650 mg via ORAL
  Filled 2021-01-19 (×15): qty 2

## 2021-01-19 MED ORDER — ACETAMINOPHEN 650 MG RE SUPP: 650.0000 mg | Freq: Four times a day (QID) | RECTAL | Status: DC | PRN

## 2021-01-19 MED ORDER — POTASSIUM CHLORIDE 20 MEQ PO PACK
40.0000 meq | PACK | Freq: Once | ORAL | Status: AC
  Administered 2021-01-19: 40 meq via ORAL
  Filled 2021-01-19: qty 2

## 2021-01-19 MED ORDER — SODIUM CHLORIDE 0.9% FLUSH
3.0000 mL | Freq: Two times a day (BID) | INTRAVENOUS | Status: DC
  Administered 2021-01-20 – 2021-01-30 (×19): 3 mL via INTRAVENOUS

## 2021-01-19 MED ORDER — SODIUM CHLORIDE 0.9 % IV SOLN
2.0000 g | Freq: Once | INTRAVENOUS | Status: AC
  Administered 2021-01-19: 2 g via INTRAVENOUS
  Filled 2021-01-19: qty 2

## 2021-01-19 MED ORDER — COLCHICINE 0.6 MG PO TABS
0.6000 mg | ORAL_TABLET | Freq: Every day | ORAL | Status: DC
  Administered 2021-01-20 – 2021-01-30 (×11): 0.6 mg via ORAL
  Filled 2021-01-19 (×12): qty 1

## 2021-01-19 MED ORDER — DILTIAZEM HCL-DEXTROSE 125-5 MG/125ML-% IV SOLN (PREMIX)
5.0000 mg/h | INTRAVENOUS | Status: DC
Start: 2021-01-19 — End: 2021-01-25
  Administered 2021-01-19: 5 mg/h via INTRAVENOUS
  Administered 2021-01-20: 7.5 mg/h via INTRAVENOUS
  Administered 2021-01-21 – 2021-01-25 (×4): 5 mg/h via INTRAVENOUS
  Filled 2021-01-19 (×7): qty 125

## 2021-01-19 NOTE — ED Notes (Signed)
Per previous RN - second set of blood cultures unable to be obtained and MD was notified.

## 2021-01-19 NOTE — ED Notes (Signed)
ED Provider at bedside. 

## 2021-01-19 NOTE — Progress Notes (Signed)
Pharmacy Antibiotic Note  Rebecca Short is a 80 y.o. female admitted on 01/19/2021 with SOB/PNA.  Pharmacy has been consulted for Vancomycin and Cefepime  dosing.  Vancomycin 1500 mg IV given in ED at  at 2100  Plan: Vancomycin 1000 mg IV q24h Cefepime 2 g IV q12h     Temp (24hrs), Avg:98.3 F (36.8 C), Min:98.3 F (36.8 C), Max:98.3 F (36.8 C)  Recent Labs  Lab 01/19/21 1803  WBC 15.3*  CREATININE 0.81  LATICACIDVEN 1.6    Estimated Creatinine Clearance: 55.5 mL/min (by C-G formula based on SCr of 0.81 mg/dL).    Allergies  Allergen Reactions   Azithromycin Other (See Comments)    Pain in arm with IV infusion    Condrolite [Glucosamine-Chondroitin-Msm] Rash     Eddie Candle 01/19/2021 10:44 PM

## 2021-01-19 NOTE — ED Triage Notes (Signed)
2 days with increasing weakness/SOB. Also noted low BP and tachypnea. Metoprolol was decreased recently. Was recently discharged form hospital after having bilateral pleural /pericardial effusions. Today HR 140-180 afib, was given 10mg  Cardizem IVP and HR 80-105.

## 2021-01-19 NOTE — ED Notes (Signed)
Per MD - repeat lactic not needed.  

## 2021-01-19 NOTE — ED Provider Notes (Signed)
[facility] EMERGENCY DEPARTMENT Provider Note   CSN: [id number] Arrival date & time: 01/19/21  1700     History Chief Complaint  Patient presents with   Irregular Heart Beat    Rebecca Short is a 80 y.o. female.  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  Patient reports that she was recently admitted to the hospital.  She was brought to the hospital for shortness of breath.  She states that she was discharged from the hospital on 8/19 felt overall well when she went home.  2 days ago, she started developing a cough and generalized fatigue.  She also felt more short of breath.  She called 911 today.  When EMS arrived, they found her heart rate between 140 and 180 consistent with atrial fibrillation with RVR.  They administered 10 mg of IV diltiazem with improvement in her rates.  Her pressures were always acceptable per EMS.  The patient denies any chest pain.  She denies any leg swelling.  Past Medical History:  Diagnosis Date   A-fib [facility])    Allergy    Atrial fibrillation (HCC)    Macular degeneration    OSA (obstructive sleep apnea)    mild with total AHI 7.25/hr and severe during REM sleep at 35/hr now on CPAP at 6cm H2O    Patient Active Problem List   Diagnosis Date Noted   Arterial hypotension    Pleural effusion    Pericardial effusion    Leukocytosis 01/03/2021   Community acquired pneumonia 01/03/2021   Atrial fibrillation (HCC) 12/24/2020   Atrial fibrillation with RVR (HCC) 12/23/2020   Musculoskeletal pain 12/23/2020   Posterior vitreous detachment of both eyes 04/30/2020   Macular pucker, right eye 11/08/2019   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HCC) 11/08/2019   Intermediate stage nonexudative age-related macular  degeneration of both eyes 11/08/2019   Anticoagulated 11/18/2016   Bradycardia 03/23/2014   OSA (obstructive sleep apnea) 01/19/2014   Obesity (BMI 30-39.9) 01/19/2014   Paroxysmal A-fib (HCC) 10/04/2013   Nonspecific abnormal unspecified cardiovascular function study 07/21/2013   Acute gastroenteritis 07/15/2013    Past Surgical History:  Procedure Laterality Date   CHOLECYSTECTOMY     PERICARDIOCENTESIS N/A 01/07/2021   Procedure: PERICARDIOCENTESIS;  Surgeon: Yvonne Kendall, MD;  Location: [facility] INVASIVE CV LAB;  Service: Cardiovascular;  Laterality: N/A;   REPLACEMENT TOTAL KNEE BILATERAL       OB History   No obstetric history on file.     Family History  Problem Relation Age of Onset   Osteoporosis Mother    Heart Problems Father    Stroke Father    Cancer Maternal Grandmother    Dementia Paternal Grandmother    Kidney failure Brother    Suicidality Other    Diabetes Brother    Heart attack Neg Hx     Social History   Tobacco Use   Smoking status: Never   Smokeless tobacco: Never  Vaping Use   Vaping Use: Never used  Substance Use Topics   Alcohol use: Never   Drug use: Never    Home Medications Prior to Admission medications   Medication Sig Start Date End Date Taking? Authorizing Provider  acetaminophen (TYLENOL) 325 MG tablet Take 2 tablets (650 mg total) by mouth every 6 (six) hours as needed for moderate pain  or mild pain. 12/25/20   Arrien, York Ram, MD  CALCIUM CITRATE-VITAMIN D PO Take 1 tablet by mouth at bedtime.    [provider]  carboxymethylcellulose (REFRESH PLUS) 0.5 % SOLN Place 1 drop into both eyes 2 (two) times daily.    [provider]  cholecalciferol (VITAMIN D3) 25 MCG (1000 UNIT) tablet Take 1,000 Units by mouth daily.    [provider]  colchicine 0.6 MG tablet Take 1 tablet (0.6 mg total) by mouth daily. 01/11/21   Elgergawy, Leana Roe, MD  ELIQUIS 5 MG TABS tablet TAKE 1 TABLET BY MOUTH TWICE A  DAY Patient taking differently: Take 5 mg by mouth 2 (two) times daily. 12/04/20   Corky Crafts, MD  EPIPEN 2-PAK 0.3 MG/0.3ML SOAJ injection Inject 0.3 mg into the muscle once as needed for anaphylaxis (severe allergic reaction). 07/05/14   [provider]  furosemide (LASIX) 40 MG tablet Take 1 tablet (40 mg total) by mouth daily. 01/11/21 02/10/21  Elgergawy, Leana Roe, MD  metoprolol tartrate (LOPRESSOR) 25 MG tablet Take 1 tablet (25 mg total) by mouth 2 (two) times daily. 01/18/21   Corky Crafts, MD  Multiple Vitamin (MULTIVITAMIN WITH MINERALS) TABS tablet Take 1 tablet by mouth every morning.    [provider]  Multiple Vitamins-Minerals (PRESERVISION AREDS PO) Take 1 capsule by mouth 2 (two) times daily.    [provider]  PRESCRIPTION MEDICATION every 14 (fourteen) days. Allergy shots administered by Dr. Arma Callas    [provider]    Allergies    Azithromycin and Condrolite [glucosamine-chondroitin-msm]  Review of Systems   Review of Systems  Constitutional:  Positive for activity change and fatigue. Negative for chills and fever.  HENT:  Negative for ear pain and sore throat.   Eyes:  Negative for pain and visual disturbance.  Respiratory:  Positive for cough and shortness of breath.   Cardiovascular:  Negative for chest pain and palpitations.  Gastrointestinal:  Negative for abdominal pain and vomiting.  Genitourinary:  Negative for dysuria and hematuria.  Musculoskeletal:  Negative for arthralgias and back pain.  Skin:  Negative for color change and rash.  Neurological:  Negative for seizures and syncope.  All other systems reviewed and are negative.  Physical Exam Updated Vital Signs BP 115/65   Pulse (!) 125   Temp 98.3 F (36.8 C) (Oral)   Resp (!) 31   SpO2 92%   Physical Exam Vitals and nursing note reviewed.  Constitutional:      General: She is not in acute distress.    Appearance: She is well-developed. She is  ill-appearing. She is not toxic-appearing.  HENT:     Head: Normocephalic and atraumatic.  Eyes:     Conjunctiva/sclera: Conjunctivae normal.  Cardiovascular:     Rate and Rhythm: Tachycardia present. Rhythm irregular.  Pulmonary:     Comments: Increased work of breathing.  Tachypneic.  Markedly decreased breath sounds at the bases of her lungs, especially on the right side. Abdominal:     Palpations: Abdomen is soft.     Tenderness: There is no abdominal tenderness. There is no guarding or rebound.  Musculoskeletal:     Cervical back: Neck supple.  Skin:    General: Skin is warm and dry.     Capillary Refill: Capillary refill takes less than 2 seconds.  Neurological:     Mental Status: She is alert and oriented to person, place, and time.    ED Results /  Procedures / Treatments   Labs (all labs ordered are listed, but only abnormal results are displayed) Labs Reviewed  CBC WITH DIFFERENTIAL/PLATELET - Abnormal; Notable for the following components:      Result Value   WBC 15.3 (*)    Neutro Abs 13.0 (*)    Abs Immature Granulocytes 0.11 (*)    All other components within normal limits  COMPREHENSIVE METABOLIC PANEL - Abnormal; Notable for the following components:   Sodium 134 (*)    Potassium 3.2 (*)    Chloride 95 (*)    Glucose, Bld 118 (*)    Calcium 8.4 (*)    Total Protein 6.0 (*)    Albumin 2.8 (*)    All other components within normal limits  BRAIN NATRIURETIC PEPTIDE - Abnormal; Notable for the following components:   B Natriuretic Peptide 102.3 (*)    All other components within normal limits  CBG MONITORING, ED - Abnormal; Notable for the following components:   Glucose-Capillary 122 (*)    All other components within normal limits  RESP PANEL BY RT-PCR (FLU A&B, COVID) ARPGX2  CULTURE, BLOOD (ROUTINE X 2)  CULTURE, BLOOD (ROUTINE X 2)  LACTIC ACID, PLASMA  LACTIC ACID, PLASMA  MAGNESIUM  BASIC METABOLIC PANEL  CBC    EKG EKG  Interpretation  Date/Time:  Saturday January 19 2021 18:15:39 EDT Ventricular Rate:  114 PR Interval:    QRS Duration: 90 QT Interval:  328 QTC Calculation: 452 R Axis:   -16 Text Interpretation: Atrial fibrillation Paired ventricular premature complexes Inferior infarct, old Probable anteroseptal infarct, recent When compared to prior, similar appearance. no STEMI Confirmed by Theda Belfast ([id number]) on 01/19/2021 8:57:25 PM  Radiology DG Chest 2 View  Result Date: 01/18/2021 CLINICAL DATA:  80 year old female with a history of pericardial effusion EXAM: CHEST - 2 VIEW COMPARISON:  01/09/2021 FINDINGS: Cardiomediastinal silhouette likely unchanged with the heart borders partially obscured by overlying lung/pleural disease. Opacity at the left lung base persists obscuring left hemidiaphragm and the left heart border with associated meniscus. Decreased opacity at the right lung base with obscuration of the right hemidiaphragm and the right heart border, with associated meniscus. Decreased interlobular septal thickening. IMPRESSION: Persistent bilateral left greater than right pleural effusion with associated atelectasis/consolidation Electronically Signed   By: Gilmer Mor D.O.   On: 01/18/2021 15:06   DG Chest Port 1 View  Result Date: 01/19/2021 CLINICAL DATA:  Irregular heartbeat/pleural effusion EXAM: PORTABLE CHEST 1 VIEW COMPARISON:  Chest x-ray 01/18/2021 FINDINGS: The heart and mediastinal contours are unchanged. Aortic calcification. Retrocardiac consolidation not excluded. No pulmonary edema. Similar-appearing at least moderate volume left pleural effusion. Interval increase in at least small volume right pleural effusion. No pneumothorax. No acute osseous abnormality. IMPRESSION: 1. Similar-appearing at least moderate volume left pleural effusion. 2. Interval increase in at least small volume right pleural effusion. Electronically Signed   By: Tish Frederickson M.D.   On: 01/19/2021 18:24     Procedures Procedures   Medications Ordered in ED Medications  vancomycin (VANCOREADY) IVPB 1500 mg/300 mL (1,500 mg Intravenous New Bag/Given 01/19/21 2103)  diltiazem (CARDIZEM) 125 mg in dextrose 5% 125 mL (1 mg/mL) infusion (5 mg/hr Intravenous New Bag/Given 01/19/21 2154)  colchicine tablet 0.6 mg (has no administration in time range)  apixaban (ELIQUIS) tablet 5 mg (has no administration in time range)  polyvinyl alcohol (LIQUIFILM TEARS) 1.4 % ophthalmic solution 1 drop (has no administration in time range)  sodium chloride flush (NS) 0.9 %  injection 3 mL (has no administration in time range)  acetaminophen (TYLENOL) tablet 650 mg (has no administration in time range)    Or  acetaminophen (TYLENOL) suppository 650 mg (has no administration in time range)  ceFEPIme (MAXIPIME) 2 g in sodium chloride 0.9 % 100 mL IVPB (0 g Intravenous Stopped 01/19/21 2104)  potassium chloride (KLOR-CON) packet 40 mEq (40 mEq Oral Given 01/19/21 2154)    ED Course  I have reviewed the triage vital signs and the nursing notes.  Pertinent labs & imaging results that were available during my care of the patient were reviewed by me and considered in my medical decision making (see chart for details).    MDM Rules/Calculators/A&P                           80 year old female with above past medical history presenting to the emergency department with shortness of breath.  On arrival, the patient is tachycardic, tachypneic.  She is saturating around 94% on room air, but 2 L of oxygen via nasal nasal cannula applied for comfort.  Differential includes pneumonia, pneumothorax, pleural effusion, cardiac tamponade, atrial fibrillation with RVR.  She received 10 mg of IV diltiazem with EMS, and currently her rates are controlled around 100-110.  Will defer additional rate controlling agents at this time.  She was recently hospitalized and she underwent a pericardiocentesis as well as a thoracentesis that showed  exudative fluid and she was treated for pneumonia.  We will obtain a CBC, BMP, chest x-ray, lactic acid, blood cultures.  On point-of-care bedside ultrasound, patient has a mild to moderate pericardial effusion but no plethoric IVC, no RV diastolic collapse, overall not consistent with cardiac tamponade.  The patient does have a moderate sized pleural effusion on the right as well as a small on the left on ultrasound.  CBC returned notable for leukocytosis, her WBC has tripled since discharge.  Given her shortness of breath, generalized fatigue, new onset cough and leukocytosis, will treat empirically for pneumonia and given her recent hospitalization will administer vancomycin and cefepime.  No significant elevation in BNP.  She was given a small amount of fluid for transient episode of hypotension that resolved essentially spontaneously, her pressures have been otherwise stable throughout her ED stay.  Given her new concern for infection and significant work of breathing, believe that she warrants inpatient admission for likely thoracentesis and continue antibiotics.  Handoff given to admitting team.  Final Clinical Impression(s) / ED Diagnoses Final diagnoses:  Pleural effusion    Rx / DC Orders ED Discharge Orders     None        Lenard Lance, MD 01/19/21 2217    Tegeler, Canary Brim, MD 01/21/21 1104

## 2021-01-19 NOTE — ED Notes (Signed)
Patient pale c/o not "feeling well" suddenly, BP 87/50, Dr Lowell Guitar called to bedside, patient remains A/O, HR drooped from 115-70 for approximately 30 seconds.

## 2021-01-19 NOTE — Telephone Encounter (Signed)
This morning around 5 AM patient started to feel weak with near faint.  She had shoulder pain.  She called her neighbor for help.  Noted blood pressure of 84/70 with heart rate 70-100s.  The patient laid down and took Tylenol.  Symptoms improved.  Blood pressure improved to 104/74.  However around 8 AM her blood pressure dropped down again to 88/64 with heart rate of 100.  She is feeling weak.  Denies chest pain.  She was able to lay flat but had some difficulty breathing.  Has shoulder pain and cough.  Per neighbor, patient was doing well since discharge however since yesterday she has progressively got weaker with poor appetite and fluctuating blood pressure.  Most recent blood pressure 88/64 with heart rate of 100.  Oxygen saturation 92 to 93% this morning however this was ranging in 95 to 96% yesterday.  The patient was evaluated by PCP yesterday.  Had blood work done and chest x-ray.  No access to blood work.  Chest x-ray with persistent left greater than right pleural effusion.  I have recommended to hold metoprolol this morning.  Give Lasix if systolic blood pressure above 90.  Monitor symptoms very closely.  If no improvement or worsening oxygen saturation advise to call EMS or bring to ER.  Patient and neighbor agree with plan and appreciative of call.

## 2021-01-19 NOTE — H&P (Signed)
History and Physical   Rebecca Short MHD:[id number] DOB: 1941-03-15 DOA: 01/19/2021  PCP: Daisy Floro, MD   Patient coming from: Home  Chief Complaint: Shortness of breath, weakness  HPI: Rebecca Short is a 80 y.o. female with medical history significant of A. fib, obesity, OSA, recent admissions presenting with ongoing weakness and shortness of breath.  Patient recently admitted in July for A. fib with RVR.  Also admitted earlier this month for A. fib with RVR, pleural effusion, pericardial effusion.  At 1 L of exudative fluid drained off her left pleural effusion last admission.  Also noted to have an increasing pericardial effusion at the time with a somewhat increased right atrial pressure but no tamponade, discharged on colchicine for this.  She was also noted to have acute CHF with tachycardia induced cardiomyopathy and the effusion contributing, she was diuresed and discharged on 40 mg p.o. Lasix daily.  Starting yesterday she was feeling well and was clammy with some low blood pressures.  Called cardiology at that time and they said to hold off on her metoprolol and Lasix.  She already see her PCP who evaluated her start a work-up.  She is again feeling well this morning, but her shortness of breath and weakness and also some repeat low blood pressures.  Her neighbor who accompanied her to the ED has been helping her at home.  They called her cardiologist office who recommended she hold her metoprolol and take Lasix only if BP greater than 90 systolic (which it was so she took it) and then if not improving or needing increased oxygen that she needs to go to the ED for further evaluation.  She did end up calling EMS and she was found to be in RVR on their arrival and she was given dose of diltiazem IV.  She also reports decreased appetite and variable blood pressures.  She denies fevers, chills, chest pain, abdominal pain, constipation, diarrhea, nausea, vomiting.   ED Course: Vital  signs in the ED significant for respiratory rate in the 20s to 30s and heart rate in the 100s to 130s blood pressure has been in the 90s to 1 teens systolic.  Lab work-up showed CMP with sodium 134, chloride 95, potassium 3.2, glucose 118, calcium 8.4 which improved considering albumin of 2.8, protein 6.  CBC with new leukocytosis to 15.3.  BNP borderline at 102.  Lactic acid normal, respiratory panel for flu and COVID-negative.  Blood cultures pending.  Chest x-ray with similar moderate left pleural effusion and small increase in the small right pleural effusion.  Given a dose of vancomycin and cefepime in the ED, started on dill drip and given 40 mEq of p.o. potassium.  Cardiology was consulted and are seeing the patient.  Review of Systems: As per HPI otherwise all other systems reviewed and are negative.  Past Medical History:  Diagnosis Date   A-fib [facility])    Allergy    Atrial fibrillation (HCC)    Macular degeneration    OSA (obstructive sleep apnea)    mild with total AHI 7.25/hr and severe during REM sleep at 35/hr now on CPAP at 6cm H2O    Past Surgical History:  Procedure Laterality Date   CHOLECYSTECTOMY     PERICARDIOCENTESIS N/A 01/07/2021   Procedure: PERICARDIOCENTESIS;  Surgeon: Yvonne Kendall, MD;  Location: [facility] INVASIVE CV LAB;  Service: Cardiovascular;  Laterality: N/A;   REPLACEMENT TOTAL KNEE BILATERAL      Social History  reports that she  has never smoked. She has never used smokeless tobacco. She reports that she does not drink alcohol and does not use drugs.  Allergies  Allergen Reactions   Azithromycin Other (See Comments)    Pain in arm with IV infusion    Condrolite [Glucosamine-Chondroitin-Msm] Rash    Family History  Problem Relation Age of Onset   Osteoporosis Mother    Heart Problems Father    Stroke Father    Cancer Maternal Grandmother    Dementia Paternal Grandmother    Kidney failure Brother    Suicidality Other    Diabetes Brother    Heart  attack Neg Hx   Reviewed on admission  Prior to Admission medications   Medication Sig Start Date End Date Taking? Authorizing Provider  acetaminophen (TYLENOL) 325 MG tablet Take 2 tablets (650 mg total) by mouth every 6 (six) hours as needed for moderate pain or mild pain. 12/25/20   Arrien, York Ram, MD  CALCIUM CITRATE-VITAMIN D PO Take 1 tablet by mouth at bedtime.    [provider]  carboxymethylcellulose (REFRESH PLUS) 0.5 % SOLN Place 1 drop into both eyes 2 (two) times daily.    [provider]  cholecalciferol (VITAMIN D3) 25 MCG (1000 UNIT) tablet Take 1,000 Units by mouth daily.    [provider]  colchicine 0.6 MG tablet Take 1 tablet (0.6 mg total) by mouth daily. 01/11/21   Elgergawy, Leana Roe, MD  ELIQUIS 5 MG TABS tablet TAKE 1 TABLET BY MOUTH TWICE A DAY Patient taking differently: Take 5 mg by mouth 2 (two) times daily. 12/04/20   Corky Crafts, MD  EPIPEN 2-PAK 0.3 MG/0.3ML SOAJ injection Inject 0.3 mg into the muscle once as needed for anaphylaxis (severe allergic reaction). 07/05/14   [provider]  furosemide (LASIX) 40 MG tablet Take 1 tablet (40 mg total) by mouth daily. 01/11/21 02/10/21  Elgergawy, Leana Roe, MD  metoprolol tartrate (LOPRESSOR) 25 MG tablet Take 1 tablet (25 mg total) by mouth 2 (two) times daily. 01/18/21   Corky Crafts, MD  Multiple Vitamin (MULTIVITAMIN WITH MINERALS) TABS tablet Take 1 tablet by mouth every morning.    [provider]  Multiple Vitamins-Minerals (PRESERVISION AREDS PO) Take 1 capsule by mouth 2 (two) times daily.    [provider]  PRESCRIPTION MEDICATION every 14 (fourteen) days. Allergy shots administered by Dr. Irvington Callas    [provider]    Physical Exam: Vitals:   01/19/21 2100 01/19/21 2115 01/19/21 2130 01/19/21 2149  BP: 108/84 107/75 113/61 115/65  Pulse: (!) 107 (!) 51 (!) 101 (!) 125  Resp: (!) 33 (!) 32 (!) 27 (!) 31  Temp:       TempSrc:      SpO2: 96% 96% 95% 92%   Physical Exam Constitutional:      General: She is not in acute distress.    Appearance: Normal appearance.  HENT:     Head: Normocephalic and atraumatic.     Mouth/Throat:     Mouth: Mucous membranes are moist.     Pharynx: Oropharynx is clear.  Eyes:     Extraocular Movements: Extraocular movements intact.     Pupils: Pupils are equal, round, and reactive to light.  Cardiovascular:     Rate and Rhythm: Tachycardia present. Rhythm irregular.     Pulses: Normal pulses.     Heart sounds: Normal heart sounds.  Pulmonary:     Effort: Pulmonary effort is normal. No respiratory distress.  Breath sounds: Normal breath sounds.     Comments: Absent breath sounds left base Tachypnea Abdominal:     General: Bowel sounds are normal. There is no distension.     Palpations: Abdomen is soft.     Tenderness: There is no abdominal tenderness.  Musculoskeletal:        General: No swelling or deformity.  Skin:    General: Skin is warm and dry.  Neurological:     General: No focal deficit present.     Mental Status: Mental status is at baseline.   Labs on Admission: I have personally reviewed following labs and imaging studies  CBC: Recent Labs  Lab 01/19/21 1803  WBC 15.3*  NEUTROABS 13.0*  HGB 14.8  HCT 45.2  MCV 89.3  PLT 241    Basic Metabolic Panel: Recent Labs  Lab 01/19/21 1803  NA 134*  K 3.2*  CL 95*  CO2 28  GLUCOSE 118*  BUN 13  CREATININE 0.81  CALCIUM 8.4*    GFR: Estimated Creatinine Clearance: 55.5 mL/min (by C-G formula based on SCr of 0.81 mg/dL).  Liver Function Tests: Recent Labs  Lab 01/19/21 1803  AST 26  ALT 31  ALKPHOS 88  BILITOT 1.1  PROT 6.0*  ALBUMIN 2.8*    Urine analysis:    Component Value Date/Time   COLORURINE YELLOW 01/03/2021 1810   APPEARANCEUR HAZY (A) 01/03/2021 1810   LABSPEC 1.013 01/03/2021 1810   PHURINE 6.0 01/03/2021 1810   GLUCOSEU NEGATIVE 01/03/2021 1810   HGBUR  NEGATIVE 01/03/2021 1810   BILIRUBINUR NEGATIVE 01/03/2021 1810   KETONESUR 20 (A) 01/03/2021 1810   PROTEINUR NEGATIVE 01/03/2021 1810   UROBILINOGEN 0.2 07/15/2013 1004   NITRITE NEGATIVE 01/03/2021 1810   LEUKOCYTESUR TRACE (A) 01/03/2021 1810    Radiological Exams on Admission: DG Chest 2 View  Result Date: 01/18/2021 CLINICAL DATA:  80 year old female with a history of pericardial effusion EXAM: CHEST - 2 VIEW COMPARISON:  01/09/2021 FINDINGS: Cardiomediastinal silhouette likely unchanged with the heart borders partially obscured by overlying lung/pleural disease. Opacity at the left lung base persists obscuring left hemidiaphragm and the left heart border with associated meniscus. Decreased opacity at the right lung base with obscuration of the right hemidiaphragm and the right heart border, with associated meniscus. Decreased interlobular septal thickening. IMPRESSION: Persistent bilateral left greater than right pleural effusion with associated atelectasis/consolidation Electronically Signed   By: Gilmer Mor D.O.   On: 01/18/2021 15:06   DG Chest Port 1 View  Result Date: 01/19/2021 CLINICAL DATA:  Irregular heartbeat/pleural effusion EXAM: PORTABLE CHEST 1 VIEW COMPARISON:  Chest x-ray 01/18/2021 FINDINGS: The heart and mediastinal contours are unchanged. Aortic calcification. Retrocardiac consolidation not excluded. No pulmonary edema. Similar-appearing at least moderate volume left pleural effusion. Interval increase in at least small volume right pleural effusion. No pneumothorax. No acute osseous abnormality. IMPRESSION: 1. Similar-appearing at least moderate volume left pleural effusion. 2. Interval increase in at least small volume right pleural effusion. Electronically Signed   By: Tish Frederickson M.D.   On: 01/19/2021 18:24    EKG: Independently reviewed.  Atrial fibrillation versus a flutter with RVR at 114 bpm.  Assessment/Plan Principal Problem:   Atrial fibrillation with  RVR (HCC) Active Problems:   OSA (obstructive sleep apnea)   Anticoagulated   Leukocytosis   Pericardial effusion   Pleural effusion  Atrial fibrillation with RVR > Some weakness and shortness of breath today likely multifactorial as below also with ongoing pericardial effusion, pleural effusion. >  Rates in the 100s to 130s in the ED.  Has been started on a dill drip. > Cardiology has been consulted for this and below pericardial effusion. > Also noted to have hypokalemia with potassium of 2 in the ED.  Has received 40 mEq of p.o. potassium. - Appreciate cardiology recommendations - We will monitor in progressive - Continue with dill drip - Continue with home Eliquis - Continue with potassium supplementation, check magnesium - Holding metoprolol  Recurrent pleural effusion > Has been seen for pleural effusions recently.  Had 1 L drained off the left pleural effusion last admission, this appears to has recurred as her current chest x-ray shows residual moderate pleural effusion. > Now with worsening shortness of breath as above multifactorial with this and A. fib and RVR as well as below pericardial effusion. - We will be monitoring in progressive - I have put in orders for repeat thoracentesis via IR for left pleural effusion - We will add on labs to reevaluate given new leukocytosis.  We will be checking protein, LDH, cell count, glucose, gram stain, culture - Supplemental oxygen as needed  Pericardial effusion > Known pericardial effusion.  At last admission had increased some and had some increased right atrial pressure but no tamponade physiology. > ED provider said performed POCUS with small to moderate effusion by their estimation without tamponade physiology. - Appreciate cardiology recommendations - We will defer to cardiology on possible repeat echocardiogram - Continue home colchicine  Leukocytosis ?PNA > Noted to have new white count of 15.3.  No fever.  Normal lactic  acid. > Given recent admission and effusions with atelectasis that could hide pneumonia along with tachypnea HCAP coverage has been ordered in the ED with vancomycin and cefepime. > Could also be reactive with her A. fib with RVR, effusion effusions.   She has some improvement comfort when leaning forward, but unlikely for this to be pericarditis, is already on colchicine. - We will continue with vancomycin and cefepime overnight - Check procalcitonin - Follow-up thoracentesis labs - Trend fever curve and white count  Tachycardia cardiomyopathy Recent CHF exacerbation > Recently had CHF flare in the setting of tachycardia induced cardiomyopathy and pericardial effusion. - Was diuresed at S admission and discharged on 40 mg Lasix p.o. daily - We will hold off on additional Lasix for now unless cardiology recommends otherwise  OSA - Continue home CPAP    DVT prophylaxis: Eliquis Code Status:   Full  Family Communication:  Neighbor who accompanied her was updated at bedside Disposition Plan:   Patient is from:  Home  Anticipated DC to:  Home  Anticipated DC date:  1 to 4 days  Anticipated DC barriers: None  Consults called:  Cardiology, consulted by EDP.   Admission status:  Observation, progressive  Severity of Illness: The appropriate patient status for this patient is OBSERVATION. Observation status is judged to be reasonable and necessary in order to provide the required intensity of service to ensure the patient's safety. The patient's presenting symptoms, physical exam findings, and initial radiographic and laboratory data in the context of their medical condition is felt to place them at decreased risk for further clinical deterioration. Furthermore, it is anticipated that the patient will be medically stable for discharge from the hospital within 2 midnights of admission. The following factors support the patient status of observation.   " The patient's presenting symptoms  include tachycardia, weakness, shortness of breath. " The physical exam findings include tachycardia, irregular rhythm, decreased  breath sounds left base, tachypnea. " The initial radiographic and laboratory data are Lab work-up showed CMP with sodium 134, chloride 95, potassium 3.2, glucose 118, calcium 8.4 which improved considering albumin of 2.8, protein 6.  CBC with new leukocytosis to 15.3.  BNP borderline at 102.  Lactic acid normal, respiratory panel for flu and COVID-negative.  Blood cultures pending.  Chest x-ray with similar moderate left pleural effusion and small increase in the small right pleural effusion.    Synetta Fail MD [facility]  How to contact the [facility] Attending or Consulting provider 7A - 7P or covering provider during after hours 7P -7A, for this patient?   Check the care team in [facility] and look for a) attending/consulting [facility] provider listed and b) the [facility] team listed Log into [email or web address] and use [facility] universal password to access. If you do not have the password, please contact the hospital operator. Locate the [facility] provider you are looking for under [facility] and page to a number that you can be directly reached. If you still have difficulty reaching the provider, please page the [facility] (Director on Call) for the Hospitalists listed on amion for assistance.  01/19/2021, 10:14 PM

## 2021-01-20 ENCOUNTER — Other Ambulatory Visit: Payer: Self-pay

## 2021-01-20 ENCOUNTER — Encounter (HOSPITAL_COMMUNITY): Payer: Self-pay | Admitting: Internal Medicine

## 2021-01-20 ENCOUNTER — Observation Stay (HOSPITAL_COMMUNITY): Payer: Medicare Other

## 2021-01-20 DIAGNOSIS — Z809 Family history of malignant neoplasm, unspecified: Secondary | ICD-10-CM | POA: Diagnosis not present

## 2021-01-20 DIAGNOSIS — Z8262 Family history of osteoporosis: Secondary | ICD-10-CM | POA: Diagnosis not present

## 2021-01-20 DIAGNOSIS — I4821 Permanent atrial fibrillation: Secondary | ICD-10-CM | POA: Diagnosis present

## 2021-01-20 DIAGNOSIS — Z96653 Presence of artificial knee joint, bilateral: Secondary | ICD-10-CM | POA: Diagnosis present

## 2021-01-20 DIAGNOSIS — Z9889 Other specified postprocedural states: Secondary | ICD-10-CM | POA: Diagnosis not present

## 2021-01-20 DIAGNOSIS — R0781 Pleurodynia: Secondary | ICD-10-CM

## 2021-01-20 DIAGNOSIS — I313 Pericardial effusion (noninflammatory): Secondary | ICD-10-CM | POA: Diagnosis present

## 2021-01-20 DIAGNOSIS — Z841 Family history of disorders of kidney and ureter: Secondary | ICD-10-CM | POA: Diagnosis not present

## 2021-01-20 DIAGNOSIS — I4891 Unspecified atrial fibrillation: Secondary | ICD-10-CM | POA: Diagnosis not present

## 2021-01-20 DIAGNOSIS — Z881 Allergy status to other antibiotic agents status: Secondary | ICD-10-CM | POA: Diagnosis not present

## 2021-01-20 DIAGNOSIS — Z9049 Acquired absence of other specified parts of digestive tract: Secondary | ICD-10-CM | POA: Diagnosis not present

## 2021-01-20 DIAGNOSIS — Z20822 Contact with and (suspected) exposure to covid-19: Secondary | ICD-10-CM | POA: Diagnosis present

## 2021-01-20 DIAGNOSIS — E669 Obesity, unspecified: Secondary | ICD-10-CM | POA: Diagnosis present

## 2021-01-20 DIAGNOSIS — I959 Hypotension, unspecified: Secondary | ICD-10-CM | POA: Diagnosis not present

## 2021-01-20 DIAGNOSIS — Z79899 Other long term (current) drug therapy: Secondary | ICD-10-CM | POA: Diagnosis not present

## 2021-01-20 DIAGNOSIS — J90 Pleural effusion, not elsewhere classified: Secondary | ICD-10-CM | POA: Diagnosis present

## 2021-01-20 DIAGNOSIS — I428 Other cardiomyopathies: Secondary | ICD-10-CM | POA: Diagnosis present

## 2021-01-20 DIAGNOSIS — E876 Hypokalemia: Secondary | ICD-10-CM | POA: Diagnosis present

## 2021-01-20 DIAGNOSIS — Z7901 Long term (current) use of anticoagulants: Secondary | ICD-10-CM | POA: Diagnosis not present

## 2021-01-20 DIAGNOSIS — Z6832 Body mass index (BMI) 32.0-32.9, adult: Secondary | ICD-10-CM | POA: Diagnosis not present

## 2021-01-20 DIAGNOSIS — Z833 Family history of diabetes mellitus: Secondary | ICD-10-CM | POA: Diagnosis not present

## 2021-01-20 DIAGNOSIS — Z888 Allergy status to other drugs, medicaments and biological substances status: Secondary | ICD-10-CM | POA: Diagnosis not present

## 2021-01-20 DIAGNOSIS — Z823 Family history of stroke: Secondary | ICD-10-CM | POA: Diagnosis not present

## 2021-01-20 DIAGNOSIS — G4733 Obstructive sleep apnea (adult) (pediatric): Secondary | ICD-10-CM | POA: Diagnosis present

## 2021-01-20 LAB — CBC
HCT: 38.9 % (ref 36.0–46.0)
Hemoglobin: 12.8 g/dL (ref 12.0–15.0)
MCH: 29.2 pg (ref 26.0–34.0)
MCHC: 32.9 g/dL (ref 30.0–36.0)
MCV: 88.8 fL (ref 80.0–100.0)
Platelets: 206 10*3/uL (ref 150–400)
RBC: 4.38 MIL/uL (ref 3.87–5.11)
RDW: 14.2 % (ref 11.5–15.5)
WBC: 14 10*3/uL — ABNORMAL HIGH (ref 4.0–10.5)
nRBC: 0 % (ref 0.0–0.2)

## 2021-01-20 LAB — BODY FLUID CELL COUNT WITH DIFFERENTIAL
Eos, Fluid: 0 %
Lymphs, Fluid: 41 %
Monocyte-Macrophage-Serous Fluid: 3 % — ABNORMAL LOW (ref 50–90)
Neutrophil Count, Fluid: 56 % — ABNORMAL HIGH (ref 0–25)
Total Nucleated Cell Count, Fluid: 1267 cu mm — ABNORMAL HIGH (ref 0–1000)

## 2021-01-20 LAB — ECHOCARDIOGRAM LIMITED
Calc EF: 64.6 %
Height: 62 in
Single Plane A2C EF: 67.4 %
Single Plane A4C EF: 63.3 %
Weight: 2968 oz

## 2021-01-20 LAB — BASIC METABOLIC PANEL
Anion gap: 8 (ref 5–15)
BUN: 15 mg/dL (ref 8–23)
CO2: 26 mmol/L (ref 22–32)
Calcium: 8.2 mg/dL — ABNORMAL LOW (ref 8.9–10.3)
Chloride: 101 mmol/L (ref 98–111)
Creatinine, Ser: 0.84 mg/dL (ref 0.44–1.00)
GFR, Estimated: >60 mL/min (ref >60)
Glucose, Bld: 114 mg/dL — ABNORMAL HIGH (ref 70–99)
Potassium: 3.2 mmol/L — ABNORMAL LOW (ref 3.5–5.1)
Sodium: 135 mmol/L (ref 135–145)

## 2021-01-20 LAB — PROCALCITONIN: Procalcitonin: <0.1 ng/mL

## 2021-01-20 LAB — SEDIMENTATION RATE: Sed Rate: 30 mm/hr — ABNORMAL HIGH (ref 0–22)

## 2021-01-20 LAB — GLUCOSE, PLEURAL OR PERITONEAL FLUID: Glucose, Fluid: 94 mg/dL

## 2021-01-20 LAB — LACTATE DEHYDROGENASE, PLEURAL OR PERITONEAL FLUID: LD, Fluid: 86 U/L — ABNORMAL HIGH (ref 3–23)

## 2021-01-20 LAB — PROTEIN, PLEURAL OR PERITONEAL FLUID: Total protein, fluid: 3.2 g/dL

## 2021-01-20 LAB — MAGNESIUM: Magnesium: 1.9 mg/dL (ref 1.7–2.4)

## 2021-01-20 LAB — C-REACTIVE PROTEIN: CRP: 23.4 mg/dL — ABNORMAL HIGH (ref <1.0)

## 2021-01-20 MED ORDER — POTASSIUM CHLORIDE 20 MEQ PO PACK
40.0000 meq | PACK | Freq: Two times a day (BID) | ORAL | Status: AC
  Administered 2021-01-20 – 2021-01-21 (×4): 40 meq via ORAL
  Filled 2021-01-20 (×4): qty 2

## 2021-01-20 MED ORDER — POTASSIUM CHLORIDE CRYS ER 20 MEQ PO TBCR
40.0000 meq | EXTENDED_RELEASE_TABLET | Freq: Two times a day (BID) | ORAL | Status: DC
  Filled 2021-01-20: qty 2

## 2021-01-20 MED ORDER — METOPROLOL TARTRATE 12.5 MG HALF TABLET
12.5000 mg | ORAL_TABLET | Freq: Four times a day (QID) | ORAL | Status: DC
  Administered 2021-01-20 – 2021-01-21 (×4): 12.5 mg via ORAL
  Filled 2021-01-20 (×4): qty 1

## 2021-01-20 MED ORDER — LIDOCAINE HCL (PF) 1 % IJ SOLN
INTRAMUSCULAR | Status: AC
  Filled 2021-01-20: qty 30

## 2021-01-20 NOTE — ED Notes (Signed)
Attempted to give reportx1 

## 2021-01-20 NOTE — Progress Notes (Signed)
   01/20/21 1811  Assess: MEWS Score  Temp 99 F (37.2 C)  BP (!) 99/56  Pulse Rate 86  ECG Heart Rate 84  Resp (!) 27  SpO2 94 %  Patient Activity (if Appropriate) In bed  O2 Flow Rate (L/min) 2 L/min  Assess: MEWS Score  MEWS Temp 0  MEWS Systolic 1  MEWS Pulse 0  MEWS RR 2  MEWS LOC 0  MEWS Score 3  MEWS Score Color Yellow  Assess: if the MEWS score is Yellow or Red  Were vital signs taken at a resting state? Yes  Focused Assessment No change from prior assessment  Early Detection of Sepsis Score *See Row Information* Low  MEWS guidelines implemented *See Row Information* No, previously yellow, continue vital signs every 4 hours  Treat  MEWS Interventions Escalated (See documentation below)  Take Vital Signs  Increase Vital Sign Frequency  Yellow: Q 2hr X 2 then Q 4hr X 2, if remains yellow, continue Q 4hrs  Escalate  MEWS: Escalate Yellow: discuss with charge nurse/RN and consider discussing with provider and RRT  Notify: Charge Nurse/RN  Name of Charge Nurse/RN Notified Judeth Cornfield RN   Date Charge Nurse/RN Notified 01/20/21  Time Charge Nurse/RN Notified 1816 (decreased cardizem to 7.5 cc/hr)

## 2021-01-20 NOTE — Progress Notes (Signed)
RT note. Patient placed on home cpap, patient sat 94% and resting comfortable. RT will continue to monitor,

## 2021-01-20 NOTE — Procedures (Signed)
PROCEDURE SUMMARY:  Successful US guided left thoracentesis. Yielded 1 L of clear amber fluid. Pt tolerated procedure well. No immediate complications.  Specimen was sent for labs. CXR ordered.  EBL < 5 mL  Brayton El PA-C 01/20/2021 11:23 AM

## 2021-01-20 NOTE — Progress Notes (Signed)
   01/20/21 1545  Assess: MEWS Score  Temp 98.6 F (37 C)  BP (!) 106/54  Resp (!) 26  O2 Device Nasal Cannula  Assess: MEWS Score  MEWS Temp 0  MEWS Systolic 0  MEWS Pulse 0  MEWS RR 2  MEWS LOC 0  MEWS Score 2  MEWS Score Color Yellow  Assess: if the MEWS score is Yellow or Red  Were vital signs taken at a resting state? Yes  Focused Assessment No change from prior assessment  Early Detection of Sepsis Score *See Row Information* High  MEWS guidelines implemented *See Row Information* Yes  Treat  MEWS Interventions Escalated (See documentation below)  Take Vital Signs  Increase Vital Sign Frequency  Yellow: Q 2hr X 2 then Q 4hr X 2, if remains yellow, continue Q 4hrs  Escalate  MEWS: Escalate Yellow: discuss with charge nurse/RN and consider discussing with provider and RRT  Notify: Charge Nurse/RN  Name of Charge Nurse/RN Notified Judeth Cornfield RN   Date Charge Nurse/RN Notified 01/20/21  Time Charge Nurse/RN Notified 1616  Document  Patient Outcome Other (Comment) (pt stable post thoracentesis)  Progress note created (see row info) Yes

## 2021-01-20 NOTE — Progress Notes (Signed)
Rebecca Short  EHO:[id number] DOB: 05-Dec-1940 DOA: 01/19/2021 PCP: Daisy Floro, MD    Brief Narrative:  80 year old with a history of permanent atrial fibrillation, obesity, and OSA who presented to the ED with severe ongoing generalized weakness and shortness of breath, suffering with blood pressures into the 80s at home.  She was admitted in July with atrial fibrillation.  She was admitted in August with pleural effusions and a pericardial effusion.  At that time 1 L of an exudative fluid was drained from her left pleural space.  She was discharged on colchicine for her pericardial effusion.  In the ED a CXR noted a similar moderate left pleural effusion and a small increase in an otherwise small right pleural effusion.  Significant Events:  7/31 - 8/2 admit for afib w/ RVR and hypotension 8/11 - 8/19 admit for PNA w/ pericardial and pleural effusions  8/14 thoracentesis - 950cc drained c/w exudate  8/15 pericardiocentesis - 570cc drained  8/16 pericardial drain removed  8/27 admit via ED  Consultants:  [facility]  Code Status: FULL CODE  Antimicrobials:  Vancomycin 8/27 > Cefepime 8/27 >  DVT prophylaxis: Eliquis  Subjective: Afebrile.  Heart rate controlled at 77-104.  Blood pressure stable.  Saturations 94%.  Hypokalemic at 3.2.  Feeling significantly improved status post repeat thoracentesis.  Denies current chest pain nausea or vomiting.  Reports feeling weak in general.  Assessment & Plan:  Chronic atrial fibrillation with acute RVR Heart rate up to 130 at presentation - placed on diltiazem drip - continue usual Eliquis -cardiology attending to this issue  Persistent/recurrent bilateral pleural effusions Had a 1 L thoracentesis noting exudative fluid on left earlier this month -has recurred - tx for PNA earlier this month - CT Aug 2022 w/o evidence of lung mass -follow-up thoracentesis labs pending -patient has requested pulmonary consultation 8/29 and I do not  think this is unreasonable - f/u CXR in AM to reassess effusion volume -continue empiric antibiotic  Pericardial effusion -persisting This was noted earlier this month during her prior admission and was treated with colchicine -no evidence of tamponade on -Cardiology monitoring -continue colchicine -repeat TTE pending  Tachycardia induced cardiomyopathy Suffered recent acute CHF episode felt to be due to tachycardia -was previously diagnosed and is usually on Lasix 40 mg daily  Possible pneumonia WBC elevated at presentation but patient with no fever - follow-up thoracentesis labs -procalcitonin < 0.10 - stop vancomycin but continue cefepime for now until thoracentesis fluid results are available  OSA Continue usual CPAP dosing   Family Communication: Spoke with husband at bedside Status is: Inpatient  Remains inpatient appropriate because:Inpatient level of care appropriate due to severity of illness  Dispo: The patient is from: Home              Anticipated d/c is to:  Unclear              Patient currently is not medically stable to d/c.   Difficult to place patient No    Objective: Blood pressure (!) 127/51, pulse 99, temperature 97.8 F (36.6 C), temperature source Oral, resp. rate (!) 29, SpO2 92 %.  Intake/Output Summary (Last 24 hours) at 01/20/2021 3704 Last data filed at 01/19/2021 2305 Gross per 24 hour  Intake 400 ml  Output --  Net 400 ml   There were no vitals filed for this visit.  Examination: General: No acute respiratory distress Lungs: Diminished breath sounds left base with no wheezing and good  air movement throughout other fields Cardiovascular: Irregularly irregular without murmur or rub Abdomen: Nontender, nondistended, soft, bowel sounds positive, no rebound, no ascites, no appreciable mass Extremities: No significant cyanosis, clubbing, or edema bilateral lower extremities  CBC: Recent Labs  Lab 01/19/21 1803 01/20/21 0313  WBC 15.3* 14.0*   NEUTROABS 13.0*  --   HGB 14.8 12.8  HCT 45.2 38.9  MCV 89.3 88.8  PLT 241 206   Basic Metabolic Panel: Recent Labs  Lab 01/19/21 1803 01/20/21 0313  NA 134* 135  K 3.2* 3.2*  CL 95* 101  CO2 28 26  GLUCOSE 118* 114*  BUN 13 15  CREATININE 0.81 0.84  CALCIUM 8.4* 8.2*  MG  --  1.9   GFR: Estimated Creatinine Clearance: 53.5 mL/min (by C-G formula based on SCr of 0.84 mg/dL).  Liver Function Tests: Recent Labs  Lab 01/19/21 1803  AST 26  ALT 31  ALKPHOS 88  BILITOT 1.1  PROT 6.0*  ALBUMIN 2.8*    HbA1C: Hgb A1c MFr Bld  Date/Time Value Ref Range Status  12/23/2020 10:12 PM 5.4 4.8 - 5.6 % Final    Comment:    (NOTE) Pre diabetes:          5.7%-6.4%  Diabetes:              >6.4%  Glycemic control for   <7.0% adults with diabetes     CBG: Recent Labs  Lab 01/19/21 1712  GLUCAP 122*    Recent Results (from the past 240 hour(s))  Resp Panel by RT-PCR (Flu A&B, Covid) Nasopharyngeal Swab     Status: None   Collection Time: 01/19/21  6:03 PM   Specimen: Nasopharyngeal Swab; Nasopharyngeal(NP) swabs in vial transport medium  Result Value Ref Range Status   SARS Coronavirus 2 by RT PCR NEGATIVE NEGATIVE Final    Comment: (NOTE) SARS-CoV-2 target nucleic acids are NOT DETECTED.  The SARS-CoV-2 RNA is generally detectable in upper respiratory specimens during the acute phase of infection. The lowest concentration of SARS-CoV-2 viral copies this assay can detect is 138 copies/mL. A negative result does not preclude SARS-Cov-2 infection and should not be used as the sole basis for treatment or other patient management decisions. A negative result may occur with  improper specimen collection/handling, submission of specimen other than nasopharyngeal swab, presence of viral mutation(s) within the areas targeted by this assay, and inadequate number of viral copies(<138 copies/mL). A negative result must be combined with clinical observations, patient  history, and epidemiological information. The expected result is Negative.  Fact Sheet for Patients:  BloggerCourse.com  Fact Sheet for Healthcare Providers:  SeriousBroker.it  This test is no t yet approved or cleared by the [place] FDA and  has been authorized for detection and/or diagnosis of SARS-CoV-2 by FDA under an Emergency Use Authorization (EUA). This EUA will remain  in effect (meaning this test can be used) for the duration of the COVID-19 declaration under Section 564(b)(1) of the Act, 21 U.S.C.section 360bbb-3(b)(1), unless the authorization is terminated  or revoked sooner.       Influenza A by PCR NEGATIVE NEGATIVE Final   Influenza B by PCR NEGATIVE NEGATIVE Final    Comment: (NOTE) The Xpert Xpress SARS-CoV-2/FLU/RSV plus assay is intended as an aid in the diagnosis of influenza from Nasopharyngeal swab specimens and should not be used as a sole basis for treatment. Nasal washings and aspirates are unacceptable for Xpert Xpress SARS-CoV-2/FLU/RSV testing.  Fact Sheet for Patients: BloggerCourse.com  Fact Sheet for Healthcare Providers: SeriousBroker.it  This test is not yet approved or cleared by the [place] FDA and has been authorized for detection and/or diagnosis of SARS-CoV-2 by FDA under an Emergency Use Authorization (EUA). This EUA will remain in effect (meaning this test can be used) for the duration of the COVID-19 declaration under Section 564(b)(1) of the Act, 21 U.S.C. section 360bbb-3(b)(1), unless the authorization is terminated or revoked.  Performed at [facility], [address] [place], [place] [address]      Scheduled Meds:  apixaban  5 mg Oral BID   colchicine  0.6 mg Oral Daily   polyvinyl alcohol  1 drop Both Eyes BID   sodium chloride flush  3 mL Intravenous Q12H   Continuous Infusions:  ceFEPime (MAXIPIME)  IV 2 g (01/20/21 0735)   diltiazem (CARDIZEM) infusion 10 mg/hr (01/19/21 2345)   vancomycin       LOS: 0 days   Lonia Blood, MD [facility] Office  [phone] Pager - Text Page per Loretha Stapler  If 7PM-7AM, please contact night-coverage per Amion 01/20/2021, 8:07 AM

## 2021-01-20 NOTE — Progress Notes (Signed)
  Echocardiogram 2D Echocardiogram has been performed.  Janalyn Harder 01/20/2021, 1:12 PM

## 2021-01-20 NOTE — Consult Note (Signed)
Cardiology Consultation:   Patient ID: EVOLEHT HOVATTER MRN: [id number]; DOB: [birth year]  Admit date: 01/19/2021 Date of Consult: 01/20/2021  PCP:  Lawerance Cruel, MD   [facility] Providers Cardiologist:  Larae Grooms, MD  Sleep Medicine:  Fransico Him, MD       Patient Profile:   Rebecca Short is a 80 y.o. female with a hx of permanent atrial fibrillation, recent pericardial/pleural effusion with drainage who is being seen 01/20/2021 for the evaluation of atrial fibrillation with RVR at the request of Dr. Trilby Drummer.  History of Present Illness:   Ms. Maheu called the office yesterday noting generalized weakness, with shoulder pain. Her blood pressure was 84/70 and HR 70-100. She was given instructions on management and instructed to present to ER if she did not improve. When her shortness of breath and fatigue worsened, she presented to the ER last evening.  History notable that she was discharged 01/11/21 after pericardiocentesis/thoracentesis.   Found to be in afib RVR on presentation. Treated with diltiazem. WBC significantly increased from prior, being treated for healthcare-associated pneumonia. Lactate normal, procalcitonin <0.10.  On my interview this AM, she notes pleuritic chest pain, cough, and intermittent shoulder pain since discharge. Denies fevers/chills, though did feel clammy on occasion. She notes that typically she recovers quickly from illness, but this has not been the case with her recent illness.  No chest pain. No syncope.  Past Medical History:  Diagnosis Date   A-fib [facility])    Allergy    Atrial fibrillation (HCC)    Macular degeneration    OSA (obstructive sleep apnea)    mild with total AHI 7.25/hr and severe during REM sleep at 35/hr now on CPAP at 6cm H2O    Past Surgical History:  Procedure Laterality Date   CHOLECYSTECTOMY     PERICARDIOCENTESIS N/A 01/07/2021   Procedure: PERICARDIOCENTESIS;  Surgeon: Nelva Bush, MD;  Location: [facility];  Service: Cardiovascular;  Laterality: N/A;   REPLACEMENT TOTAL KNEE BILATERAL       Home Medications:  Prior to Admission medications   Medication Sig Start Date End Date Taking? Authorizing Provider  acetaminophen (TYLENOL) 325 MG tablet Take 2 tablets (650 mg total) by mouth every 6 (six) hours as needed for moderate pain or mild pain. 12/25/20  Yes Arrien, Jimmy Picket, MD  CALCIUM CITRATE-VITAMIN D PO Take 1 tablet by mouth at bedtime.   Yes [provider]  carboxymethylcellulose (REFRESH PLUS) 0.5 % SOLN Place 1 drop into both eyes 2 (two) times daily.   Yes [provider]  cholecalciferol (VITAMIN D3) 25 MCG (1000 UNIT) tablet Take 1,000 Units by mouth daily.   Yes [provider]  colchicine 0.6 MG tablet Take 1 tablet (0.6 mg total) by mouth daily. 01/11/21  Yes Elgergawy, Silver Huguenin, MD  ELIQUIS 5 MG TABS tablet TAKE 1 TABLET BY MOUTH TWICE A DAY Patient taking differently: Take 5 mg by mouth 2 (two) times daily. 12/04/20  Yes Jettie Booze, MD  EPIPEN 2-PAK 0.3 MG/0.3ML SOAJ injection Inject 0.3 mg into the muscle once as needed for anaphylaxis (severe allergic reaction). 07/05/14  Yes [provider]  furosemide (LASIX) 40 MG tablet Take 1 tablet (40 mg total) by mouth daily. 01/11/21 02/10/21 Yes Elgergawy, Silver Huguenin, MD  Multiple Vitamin (MULTIVITAMIN WITH MINERALS) TABS tablet Take 1 tablet by mouth every morning.   Yes [provider]  Multiple Vitamins-Minerals (PRESERVISION AREDS PO) Take 1 capsule by mouth 2 (two)  times daily.   Yes [provider]  PRESCRIPTION MEDICATION every 14 (fourteen) days. Allergy shots administered by Dr. Donneta Romberg   Yes [provider]  metoprolol tartrate (LOPRESSOR) 25 MG tablet Take 1 tablet (25 mg total) by mouth 2 (two) times daily. 01/18/21   Jettie Booze, MD    Inpatient Medications: Scheduled Meds:  apixaban  5 mg Oral BID   colchicine  0.6 mg Oral  Daily   lidocaine (PF)       polyvinyl alcohol  1 drop Both Eyes BID   potassium chloride  40 mEq Oral BID   sodium chloride flush  3 mL Intravenous Q12H   Continuous Infusions:  ceFEPime (MAXIPIME) IV Stopped (01/20/21 1000)   diltiazem (CARDIZEM) infusion 10 mg/hr (01/19/21 2345)   PRN Meds: acetaminophen **OR** acetaminophen  Allergies:    Allergies  Allergen Reactions   Azithromycin Other (See Comments)    Pain in arm with IV infusion    Condrolite [Glucosamine-Chondroitin-Msm] Rash    Social History:   Social History   Socioeconomic History   Marital status: Married    Spouse name: Not on file   Number of children: Not on file   Years of education: Not on file   Highest education level: Not on file  Occupational History   Not on file  Tobacco Use   Smoking status: Never   Smokeless tobacco: Never  Vaping Use   Vaping Use: Never used  Substance and Sexual Activity   Alcohol use: Never   Drug use: Never   Sexual activity: Not on file  Other Topics Concern   Not on file  Social History Narrative   ** Merged History Encounter **       Social Determinants of Health   Financial Resource Strain: Not on file  Food Insecurity: Not on file  Transportation Needs: Not on file  Physical Activity: Not on file  Stress: Not on file  Social Connections: Not on file  Intimate Partner Violence: Not on file    Family History:    Family History  Problem Relation Age of Onset   Osteoporosis Mother    Heart Problems Father    Stroke Father    Cancer Maternal Grandmother    Dementia Paternal Grandmother    Kidney failure Brother    Suicidality Other    Diabetes Brother    Heart attack Neg Hx      ROS:  Please see the history of present illness.  Constitutional: Negative for chills, fever, night sweats, unintentional weight loss  HENT: Negative for ear pain and hearing loss.   Eyes: Negative for loss of vision and eye pain.  Respiratory: Negative for sputum,  wheezing.  Positive for pleuritic chest pain and cough Cardiovascular: See HPI. Gastrointestinal: Negative for abdominal pain, melena, and hematochezia.  Genitourinary: Negative for dysuria and hematuria.  Musculoskeletal: Negative for falls and myalgias.  Skin: Negative for itching and rash.  Neurological: Negative for focal weakness, focal sensory changes and loss of consciousness.  Endo/Heme/Allergies: Does not bruise/bleed easily.   All other ROS reviewed and negative.     Physical Exam/Data:   Vitals:   01/20/21 1110 01/20/21 1115 01/20/21 1120 01/20/21 1236  BP: (!) 138/51 (!) 127/51 125/60 (!) 104/58  Pulse:    94  Resp:    (!) 31  Temp:    98.4 F (36.9 C)  TempSrc:    Oral  SpO2:    95%  Weight:  Height:        Intake/Output Summary (Last 24 hours) at 01/20/2021 1300 Last data filed at 01/19/2021 2305 Gross per 24 hour  Intake 400 ml  Output --  Net 400 ml   Last 3 Weights 01/20/2021 01/11/2021 01/10/2021  Weight (lbs) 185 lb 8 oz 184 lb 1.6 oz 187 lb 9.8 oz  Weight (kg) 84.142 kg 83.507 kg 85.1 kg     Body mass index is 33.93 kg/m.  General:  Well nourished, well developed, in no acute distress HEENT: normal Lymph: no adenopathy Neck: no JVD at 45 degrees Endocrine:  No thryomegaly Vascular: No carotid bruits; RA pulses 2+ bilaterally Cardiac:  irregularly irregular S1, S2; RRR; no murmur  Lungs:  breathing unlabored. Seen post thoracentesis. Bilateral basilar crackles. Abd: soft, nontender, no hepatomegaly  Ext: bilateral trace LE edema Musculoskeletal:  No deformities, BUE and BLE strength normal and equal Skin: warm and dry  Neuro:  CNs 2-12 intact, no focal abnormalities noted Psych:  Normal affect   EKG:  The EKG was personally reviewed and demonstrates:  atrial fibrillation with RVR Telemetry:  Telemetry was personally reviewed and demonstrates:  atrial fibrillation, rates 90-110  Relevant CV Studies: Repeat echo pending  Echo 01/08/21 1. Left  ventricular ejection fraction, by estimation, is 60 to 65%. The  left ventricle has normal function. The left ventricle has no regional  wall motion abnormalities.   2. Right ventricular systolic function is normal. The right ventricular  size is normal. There is normal pulmonary artery systolic pressure.   3. Post pericardiocentesis with no reaccumulation Persistent large left  pleural effusion . The pericardial effusion is posterior to the left  ventricle. Large pleural effusion in the left lateral region.   4. The mitral valve is abnormal. Trivial mitral valve regurgitation. No  evidence of mitral stenosis.   5. The aortic valve is tricuspid. There is mild calcification of the  aortic valve. Aortic valve regurgitation is not visualized. Mild aortic  valve sclerosis is present, with no evidence of aortic valve stenosis.   6. The inferior vena cava is normal in size with greater than 50%  respiratory variability, suggesting right atrial pressure of 3 mmHg.   Laboratory Data:  High Sensitivity Troponin:   Recent Labs  Lab 12/23/20 1334 12/23/20 1513 01/03/21 0951 01/03/21 1338  TROPONINIHS <_0 Chemistry Recent Labs  Lab 01/19/21 1803 01/20/21 0313  NA 134* 135  K 3.2* 3.2*  CL 95* 101  CO2 28 26  GLUCOSE 118* 114*  BUN 13 15  CREATININE 0.81 0.84  CALCIUM 8.4* 8.2*  GFRNONAA >60 >60  ANIONGAP 11 8    Recent Labs  Lab 01/19/21 1803  PROT 6.0*  ALBUMIN 2.8*  AST 26  ALT 31  ALKPHOS 88  BILITOT 1.1   Hematology Recent Labs  Lab 01/19/21 1803 01/20/21 0313  WBC 15.3* 14.0*  RBC 5.06 4.38  HGB 14.8 12.8  HCT 45.2 38.9  MCV 89.3 88.8  MCH 29.2 29.2  MCHC 32.7 32.9  RDW 14.2 14.2  PLT 241 206   BNP Recent Labs  Lab 01/19/21 1803  BNP 102.3*    DDimer No results for input(s): DDIMER in the last 168 hours.   Radiology/Studies:  DG Chest 1 View  Result Date: 01/20/2021 CLINICAL DATA:  Status post left thoracentesis. EXAM: CHEST  1 VIEW  COMPARISON:  January 19, 2021. FINDINGS: No pneumothorax is noted. Left pleural effusion is slightly smaller status  post thoracentesis. IMPRESSION: No pneumothorax status post left thoracentesis. Electronically Signed   By: Marijo Conception M.D.   On: 01/20/2021 12:38   DG Chest 2 View  Result Date: 01/18/2021 CLINICAL DATA:  80 year old female with a history of pericardial effusion EXAM: CHEST - 2 VIEW COMPARISON:  01/09/2021 FINDINGS: Cardiomediastinal silhouette likely unchanged with the heart borders partially obscured by overlying lung/pleural disease. Opacity at the left lung base persists obscuring left hemidiaphragm and the left heart border with associated meniscus. Decreased opacity at the right lung base with obscuration of the right hemidiaphragm and the right heart border, with associated meniscus. Decreased interlobular septal thickening. IMPRESSION: Persistent bilateral left greater than right pleural effusion with associated atelectasis/consolidation Electronically Signed   By: Corrie Mckusick D.O.   On: 01/18/2021 15:06   DG Chest Port 1 View  Result Date: 01/19/2021 CLINICAL DATA:  Irregular heartbeat/pleural effusion EXAM: PORTABLE CHEST 1 VIEW COMPARISON:  Chest x-ray 01/18/2021 FINDINGS: The heart and mediastinal contours are unchanged. Aortic calcification. Retrocardiac consolidation not excluded. No pulmonary edema. Similar-appearing at least moderate volume left pleural effusion. Interval increase in at least small volume right pleural effusion. No pneumothorax. No acute osseous abnormality. IMPRESSION: 1. Similar-appearing at least moderate volume left pleural effusion. 2. Interval increase in at least small volume right pleural effusion. Electronically Signed   By: Iven Finn M.D.   On: 01/19/2021 18:24   US THORACENTESIS ASP PLEURAL SPACE W/IMG GUIDE  Result Date: 01/20/2021 EXAM: ULTRASOUND GUIDED LEFT THORACENTESIS MEDICATIONS: 1% plain lidocaine, 5 mL COMPLICATIONS: None  immediate. PROCEDURE: An ultrasound guided thoracentesis was thoroughly discussed with the patient and questions answered. The benefits, risks, alternatives and complications were also discussed. The patient understands and wishes to proceed with the procedure. Written consent was obtained. Ultrasound was performed to localize and mark an adequate pocket of fluid in the left chest. The area was then prepped and draped in the normal sterile fashion. 1% Lidocaine was used for local anesthesia. Under ultrasound guidance a 6 Fr Safe-T-Centesis catheter was introduced. Thoracentesis was performed. The catheter was removed and a dressing applied. FINDINGS: A total of approximately 1 L of clear, amber/blood-tinged fluid was removed. Samples were sent to the laboratory as requested by the clinical team. IMPRESSION: Successful ultrasound guided left thoracentesis yielding 1 L of pleural fluid. Read by: Ascencion Dike PA-C Electronically Signed   By: Jacqulynn Cadet M.D.   On: 01/20/2021 11:43     Assessment and Plan:   Permanent atrial fibrillation RVR -likely driven by inflammation from pericarditis -prior EF low normal 12/24/2020, thought to be tachycardia-induced. Most recent EF 01/08/21 60-65% -ok to continue diltiazem at this time, though diltiazem more likely to cause hypotension than metoprolol. No role for rhythm control at this time given permanent afib. Will add back oral metoprolol and aim to titrate off diltiazem drip -CHA2DS2/VAS Stroke Risk Points= 3  -on apixaban at home. S/P thoracentesis today while on apixaban. Pending echo, but if no evidence of worsening pericardial effusion, ok to continue apixaban  Leukocytosis, fatigue, hypotension -management per primary team. Initially concerning for HCAP, though procalcitonin is low. Currently on vancomycin and cefepime  Pericardial effusion Pleural effusion -s/p pericardiocentesis 8/15. Will order limited echo as bedside echo by ER reported moderate  effusion, though no tamponade seen -continue colchicine -was on lasix at discharge, diurese PRN -pericardial fluid from 8/15 with abundant WBC, predominantly mononuclear. Pleural fluid with WBC, PMN and mononuclear. Left pleural fluid with mesothelial cells noted, no malignant cells  identified. Cultures negative from both sources, no organisms seen. Elevated LDH, protein. Fluid noted as red, turbid, elevated cell counts. Concerning for inflammatory process.  -will send ESR/CRP, though has been on colchicine -s/p thoracentesis today  Hypokalemia: -K 3.2, repletion ordered. Aim for k of 4.   Risk Assessment/Risk Scores:        New York Heart Association (NYHA) Functional Class NYHA Class IV  CHA2DS2-VASc Score = 3  This indicates a 3.2% annual risk of stroke. The patient's score is based upon: CHF History: No HTN History: No Diabetes History: No Stroke History: No Vascular Disease History: No Age Score: 2 Gender Score: 1         For questions or updates, please contact [place] Please consult [email or web address] for contact info under    Signed, Buford Dresser, MD  01/20/2021 1:00 PM

## 2021-01-21 ENCOUNTER — Inpatient Hospital Stay (HOSPITAL_COMMUNITY): Payer: Medicare Other

## 2021-01-21 DIAGNOSIS — J90 Pleural effusion, not elsewhere classified: Secondary | ICD-10-CM

## 2021-01-21 DIAGNOSIS — I313 Pericardial effusion (noninflammatory): Secondary | ICD-10-CM | POA: Diagnosis not present

## 2021-01-21 DIAGNOSIS — I4891 Unspecified atrial fibrillation: Secondary | ICD-10-CM

## 2021-01-21 LAB — BASIC METABOLIC PANEL
Anion gap: 3 — ABNORMAL LOW (ref 5–15)
BUN: 18 mg/dL (ref 8–23)
CO2: 30 mmol/L (ref 22–32)
Calcium: 8.1 mg/dL — ABNORMAL LOW (ref 8.9–10.3)
Chloride: 103 mmol/L (ref 98–111)
Creatinine, Ser: 0.74 mg/dL (ref 0.44–1.00)
GFR, Estimated: >60 mL/min (ref >60)
Glucose, Bld: 126 mg/dL — ABNORMAL HIGH (ref 70–99)
Potassium: 3.9 mmol/L (ref 3.5–5.1)
Sodium: 136 mmol/L (ref 135–145)

## 2021-01-21 LAB — CBC
HCT: 37.2 % (ref 36.0–46.0)
Hemoglobin: 12.1 g/dL (ref 12.0–15.0)
MCH: 28.9 pg (ref 26.0–34.0)
MCHC: 32.5 g/dL (ref 30.0–36.0)
MCV: 88.8 fL (ref 80.0–100.0)
Platelets: 209 10*3/uL (ref 150–400)
RBC: 4.19 MIL/uL (ref 3.87–5.11)
RDW: 14.4 % (ref 11.5–15.5)
WBC: 11.6 10*3/uL — ABNORMAL HIGH (ref 4.0–10.5)
nRBC: 0 % (ref 0.0–0.2)

## 2021-01-21 LAB — MAGNESIUM: Magnesium: 1.9 mg/dL (ref 1.7–2.4)

## 2021-01-21 MED ORDER — METOPROLOL TARTRATE 25 MG PO TABS
25.0000 mg | ORAL_TABLET | Freq: Two times a day (BID) | ORAL | Status: DC
  Filled 2021-01-21: qty 1

## 2021-01-21 NOTE — Progress Notes (Signed)
Rebecca Short  SMO:[id number] DOB: 12/01/1940 DOA: 01/19/2021 PCP: Lawerance Cruel, MD    Brief Narrative:  80 year old with a history of permanent atrial fibrillation, obesity, and OSA who presented to the ED with severe ongoing generalized weakness and shortness of breath, suffering with blood pressures into the 80s at home.  She was admitted in July with atrial fibrillation.  She was admitted in August with pleural effusions and a pericardial effusion.  At that time 1 L of an exudative fluid was drained from her left pleural space.  She was discharged on colchicine for her pericardial effusion.  In the ED a CXR noted a similar moderate left pleural effusion and a small increase in an otherwise small right pleural effusion.  Cardiology, IR and pulmonology consulted.  S/p left diagnostic and therapeutic thoracentesis 1 L on 8/28.  Significant Events:  7/31 - 8/2 admit for afib w/ RVR and hypotension 8/11 - 8/19 admit for PNA w/ pericardial and pleural effusions  8/14 thoracentesis - 950cc drained c/w exudate  8/15 pericardiocentesis - 570cc drained  8/16 pericardial drain removed  8/27 admit via ED  Consultants:  [facility] Interventional radiology Pulmonology.  Code Status: FULL CODE  Antimicrobials:  Vancomycin 8/27 > Cefepime 8/27 >  DVT prophylaxis: Eliquis  Subjective: Seen this morning.  Spouse and patient's neighbor/friend is a retired [employer] ICU nurse also at bedside.  Patient reports dyspnea slightly better.  No chest pain or cough.  Insists on getting pulmonology consult in the hospital.  Does not want to keep coming back to the hospital again and again  Assessment & Plan:  Chronic atrial fibrillation with acute RVR Cardiology following.  RVR suspected due to pericarditis +/- HCAP.  Initially treated with Cardizem drip.  Rate is controlled.  Consolidating metoprolol to 25 Mg twice daily.  Weaning off Cardizem gtt. 5 mg/h.  Continue apixaban  anticoagulation.  Persistent/recurrent bilateral pleural effusions, left greater than sign right/HCAP Had a 1 L thoracentesis noting exudative fluid on left earlier this month -has recurred - tx for PNA earlier this month - CT Aug 2022 w/o evidence of lung mass -follow-up thoracentesis labs pending -patient has requested pulmonary consultation 8/29 and was called -continue empiric antibiotic -Pulmonology input appreciated.  Chemistries consistent with a week exudate.  Cytology negative so far, repeat from 8/28 is pending.  They are suspecting that this fluid is inflammatory, unclear cause.  If this is empyema, she will need tube drainage.  Checking ANA, RF.  Agree with empiric antibiotics.  Pericardial effusion -persisting/pericarditis This was noted earlier this month during her prior admission and was treated with colchicine -no evidence of tamponade on -Cardiology monitoring -continue colchicine  S/p pericardiocentesis during earlier admission Echo 8/28 with small pericardial effusion. CRP 23.6, ESR 30.  Continue colchicine.  Tachycardia induced cardiomyopathy Suffered recent acute CHF episode felt to be due to tachycardia -was previously diagnosed and is usually on Lasix 40 mg daily  Possible pneumonia WBC elevated at presentation but patient with no fever - follow-up thoracentesis labs -procalcitonin < 0.10 - stop vancomycin but continue cefepime for now until thoracentesis fluid results are available  OSA on CPAP Continue usual CPAP dosing   Family Communication: Spoke with husband and neighbor/friend at bedside Status is: Inpatient  Remains inpatient appropriate because:Inpatient level of care appropriate due to severity of illness  Dispo: The patient is from: Home              Anticipated d/c is to:  Unclear              Patient currently is not medically stable to d/c.   Difficult to place patient No    Objective:  Vitals:   01/21/21 0500 01/21/21 0700 01/21/21 0900  01/21/21 1300  BP: (!) 101/58 (!) 108/53 108/69 (!) 104/59  Pulse: 90 93 85 86  Resp: (!) 22 20 (!) 21 (!) 21  Temp: 98 F (36.7 C) 98.4 F (36.9 C) 97.9 F (36.6 C) 98.3 F (36.8 C)  TempSrc: Oral Oral Oral Oral  SpO2: 91% 93% 95% 94%  Weight: 84.1 kg     Height:          Examination: General exam: Elderly female, moderately built and obese lying propped up in bed with mild increased work of breathing. Respiratory system: Reduced breath sounds bibasilarly, left greater than sign right with occasional basal crackles.  Rest of lung fields with slightly harsh breath sounds.  No wheezing or rhonchi.  Dyspneic when speaking moderate sentences. Cardiovascular system: S1 & S2 heard, RRR. No JVD, murmurs, rubs, gallops or clicks. No pedal edema.  Telemetry personally reviewed: Atrial flutter/fib with controlled ventricular rate. Gastrointestinal system: Abdomen is nondistended, soft and nontender. No organomegaly or masses felt. Normal bowel sounds heard. Central nervous system: Alert and oriented. No focal neurological deficits. Extremities: Symmetric 5 x 5 power. Skin: No rashes, lesions or ulcers Psychiatry: Judgement and insight appear normal. Mood & affect appropriate.    CBC: Recent Labs  Lab 01/19/21 1803 01/20/21 0313 01/21/21 0116  WBC 15.3* 14.0* 11.6*  NEUTROABS 13.0*  --   --   HGB 14.8 12.8 12.1  HCT 45.2 38.9 37.2  MCV 89.3 88.8 88.8  PLT 241 206 833   Basic Metabolic Panel: Recent Labs  Lab 01/19/21 1803 01/20/21 0313 01/21/21 0116  NA 134* 135 136  K 3.2* 3.2* 3.9  CL 95* 101 103  CO2 $Re'28 26 30  'lYH$ GLUCOSE 118* 114* 126*  BUN $Re'13 15 18  'XYd$ CREATININE 0.81 0.84 0.74  CALCIUM 8.4* 8.2* 8.1*  MG  --  1.9 1.9   GFR: Estimated Creatinine Clearance: 56.4 mL/min (by C-G formula based on SCr of 0.74 mg/dL).  Liver Function Tests: Recent Labs  Lab 01/19/21 1803  AST 26  ALT 31  ALKPHOS 88  BILITOT 1.1  PROT 6.0*  ALBUMIN 2.8*    HbA1C: Hgb A1c MFr Bld   Date/Time Value Ref Range Status  12/23/2020 10:12 PM 5.4 4.8 - 5.6 % Final    Comment:    (NOTE) Pre diabetes:          5.7%-6.4%  Diabetes:              >6.4%  Glycemic control for   <7.0% adults with diabetes     CBG: Recent Labs  Lab 01/19/21 1712  GLUCAP 122*    Recent Results (from the past 240 hour(s))  Blood culture (routine x 2)     Status: None (Preliminary result)   Collection Time: 01/19/21  5:17 PM   Specimen: BLOOD  Result Value Ref Range Status   Specimen Description BLOOD IV START  Final   Special Requests   Final    BOTTLES DRAWN AEROBIC AND ANAEROBIC Blood Culture results may not be optimal due to an inadequate volume of blood received in culture bottles   Culture   Final    NO GROWTH 2 DAYS Performed at [facility], [place] [facility] [place] [address], [place], [place] [address]  Report Status PENDING  Incomplete  Resp Panel by RT-PCR (Flu A&B, Covid) Nasopharyngeal Swab     Status: None   Collection Time: 01/19/21  6:03 PM   Specimen: Nasopharyngeal Swab; Nasopharyngeal(NP) swabs in vial transport medium  Result Value Ref Range Status   SARS Coronavirus 2 by RT PCR NEGATIVE NEGATIVE Final    Comment: (NOTE) SARS-CoV-2 target nucleic acids are NOT DETECTED.  The SARS-CoV-2 RNA is generally detectable in upper respiratory specimens during the acute phase of infection. The lowest concentration of SARS-CoV-2 viral copies this assay can detect is 138 copies/mL. A negative result does not preclude SARS-Cov-2 infection and should not be used as the sole basis for treatment or other patient management decisions. A negative result may occur with  improper specimen collection/handling, submission of specimen other than nasopharyngeal swab, presence of viral mutation(s) within the areas targeted by this assay, and inadequate number of viral copies(<138 copies/mL). A negative result must be combined with clinical observations, patient history, and  epidemiological information. The expected result is Negative.  Fact Sheet for Patients:  EntrepreneurPulse.com.au  Fact Sheet for Healthcare Providers:  IncredibleEmployment.be  This test is no t yet approved or cleared by the [place] FDA and  has been authorized for detection and/or diagnosis of SARS-CoV-2 by FDA under an Emergency Use Authorization (EUA). This EUA will remain  in effect (meaning this test can be used) for the duration of the COVID-19 declaration under Section 564(b)(1) of the Act, 21 U.S.C.section 360bbb-3(b)(1), unless the authorization is terminated  or revoked sooner.       Influenza A by PCR NEGATIVE NEGATIVE Final   Influenza B by PCR NEGATIVE NEGATIVE Final    Comment: (NOTE) The Xpert Xpress SARS-CoV-2/FLU/RSV plus assay is intended as an aid in the diagnosis of influenza from Nasopharyngeal swab specimens and should not be used as a sole basis for treatment. Nasal washings and aspirates are unacceptable for Xpert Xpress SARS-CoV-2/FLU/RSV testing.  Fact Sheet for Patients: EntrepreneurPulse.com.au  Fact Sheet for Healthcare Providers: IncredibleEmployment.be  This test is not yet approved or cleared by the [place] FDA and has been authorized for detection and/or diagnosis of SARS-CoV-2 by FDA under an Emergency Use Authorization (EUA). This EUA will remain in effect (meaning this test can be used) for the duration of the COVID-19 declaration under Section 564(b)(1) of the Act, 21 U.S.C. section 360bbb-3(b)(1), unless the authorization is terminated or revoked.  Performed at [facility], [place] [address], [place], [place] [address]   Body fluid culture w Gram Stain     Status: None (Preliminary result)   Collection Time: 01/20/21 11:36 AM   Specimen: PATH Cytology Pleural fluid  Result Value Ref Range Status   Specimen Description PLEURAL FLUID  Final    Special Requests CYTO  Final   Gram Stain   Final    FEW WBC PRESENT,BOTH PMN AND MONONUCLEAR NO ORGANISMS SEEN    Culture   Final    NO GROWTH < 24 HOURS Performed at [facility], [place] [address], [place], [place] [address]    Report Status PENDING  Incomplete     Scheduled Meds:  apixaban  5 mg Oral BID   colchicine  0.6 mg Oral Daily   metoprolol tartrate  25 mg Oral BID   polyvinyl alcohol  1 drop Both Eyes BID   potassium chloride  40 mEq Oral BID   sodium chloride flush  3 mL Intravenous Q12H   Continuous Infusions:  ceFEPime (MAXIPIME) IV 2 g (01/21/21 0837)   diltiazem (CARDIZEM) infusion 5 mg/hr (01/21/21 0505)     LOS: 1 day   Vernell Leep, MD, [place], [facility]. [facility]  To contact the attending provider between 7A-7P or the covering provider during after hours 7P-7A, please log into the web site [email or web address] and access using universal [place] password for that web site. If you do not have the password, please call the hospital operator.

## 2021-01-21 NOTE — Progress Notes (Addendum)
Progress Note  Patient Name: Rebecca Short Date of Encounter: 01/21/2021  [facility] Cardiologist: [name], MD   Subjective   Pt very upset at a "missed diagnosis" at [facility]. She states this is not a normal course for her and would like to see pulmonology.   Inpatient Medications    Scheduled Meds:  apixaban  5 mg Oral BID   colchicine  0.6 mg Oral Daily   metoprolol tartrate  12.5 mg Oral Q6H   polyvinyl alcohol  1 drop Both Eyes BID   potassium chloride  40 mEq Oral BID   sodium chloride flush  3 mL Intravenous Q12H   Continuous Infusions:  ceFEPime (MAXIPIME) IV Stopped (01/20/21 2157)   diltiazem (CARDIZEM) infusion 5 mg/hr (01/21/21 0505)   PRN Meds: acetaminophen **OR** [DISCONTINUED] acetaminophen   Vital Signs    Vitals:   01/20/21 2002 01/20/21 2121 01/21/21 0500 01/21/21 0700  BP: (!) 93/56 (!) 113/58 (!) 101/58 (!) 108/53  Pulse: 84 91 90 93  Resp: (!) 29  (!) 22 20  Temp: 98.1 F (36.7 C)  98 F (36.7 C) 98.4 F (36.9 C)  TempSrc: Oral  Oral Oral  SpO2: 93%  91% 93%  Weight:   84.1 kg   Height:        Intake/Output Summary (Last 24 hours) at 01/21/2021 0811 Last data filed at 01/21/2021 0300 Gross per 24 hour  Intake 616.34 ml  Output --  Net 616.34 ml   Last 3 Weights 01/21/2021 01/20/2021 01/11/2021  Weight (lbs) 185 lb 7.9 oz 185 lb 8 oz 184 lb 1.6 oz  Weight (kg) 84.14 kg 84.142 kg 83.507 kg      Telemetry    AFib  - Personally Reviewed  ECG    No new tracings - Personally Reviewed  Physical Exam   GEN: No acute distress, upset Neck: No JVD Cardiac: irregular rhythm, regular rhythm Respiratory: diminished in bases GI: Soft, nontender, non-distended  MS: No edema; No deformity. Neuro:  Nonfocal  Psych: Normal affect   Labs    High Sensitivity Troponin:   Recent Labs  Lab 12/23/20 1334 12/23/20 1513 01/03/21 0951 01/03/21 1338  TROPONINIHS 2 2 7 5       Chemistry Recent Labs  Lab 01/19/21 1803  01/20/21 0313 01/21/21 0116  NA 134* 135 136  K 3.2* 3.2* 3.9  CL 95* 101 103  CO2 28 26 30   GLUCOSE 118* 114* 126*  BUN 13 15 18   CREATININE 0.81 0.84 0.74  CALCIUM 8.4* 8.2* 8.1*  PROT 6.0*  --   --   ALBUMIN 2.8*  --   --   AST 26  --   --   ALT 31  --   --   ALKPHOS 88  --   --   BILITOT 1.1  --   --   GFRNONAA >60 >60 >60  ANIONGAP 11 8 3*     Hematology Recent Labs  Lab 01/19/21 1803 01/20/21 0313 01/21/21 0116  WBC 15.3* 14.0* 11.6*  RBC 5.06 4.38 4.19  HGB 14.8 12.8 12.1  HCT 45.2 38.9 37.2  MCV 89.3 88.8 88.8  MCH 29.2 29.2 28.9  MCHC 32.7 32.9 32.5  RDW 14.2 14.2 14.4  PLT 241 206 209    BNP Recent Labs  Lab 01/19/21 1803  BNP 102.3*     DDimer No results for input(s): DDIMER in the last 168 hours.   Radiology    DG Chest 1 View  Result  Date: 01/20/2021 CLINICAL DATA:  Status post left thoracentesis. EXAM: CHEST  1 VIEW COMPARISON:  January 19, 2021. FINDINGS: No pneumothorax is noted. Left pleural effusion is slightly smaller status post thoracentesis. IMPRESSION: No pneumothorax status post left thoracentesis. Electronically Signed   By: Lupita Raider M.D.   On: 01/20/2021 12:38   DG Chest Port 1 View  Result Date: 01/21/2021 CLINICAL DATA:  Pleural effusion. EXAM: PORTABLE CHEST 1 VIEW COMPARISON:  January 20, 2021. FINDINGS: Stable cardiomediastinal silhouette. No pneumothorax is noted. Bilateral pleural effusions are noted with associated bibasilar atelectasis. Bony thorax is unremarkable. IMPRESSION: Stable bilateral pleural effusions with associated atelectasis. Electronically Signed   By: Lupita Raider M.D.   On: 01/21/2021 08:08   DG Chest Port 1 View  Result Date: 01/19/2021 CLINICAL DATA:  Irregular heartbeat/pleural effusion EXAM: PORTABLE CHEST 1 VIEW COMPARISON:  Chest x-ray 01/18/2021 FINDINGS: The heart and mediastinal contours are unchanged. Aortic calcification. Retrocardiac consolidation not excluded. No pulmonary edema.  Similar-appearing at least moderate volume left pleural effusion. Interval increase in at least small volume right pleural effusion. No pneumothorax. No acute osseous abnormality. IMPRESSION: 1. Similar-appearing at least moderate volume left pleural effusion. 2. Interval increase in at least small volume right pleural effusion. Electronically Signed   By: Tish Frederickson M.D.   On: 01/19/2021 18:24   ECHOCARDIOGRAM LIMITED  Result Date: 01/20/2021    ECHOCARDIOGRAM LIMITED REPORT   Patient Name:   Rebecca Short Date of Exam: 01/20/2021 Medical Rec #:  [id number]     Height:       62.0 in Accession #:    [id number]    Weight:       185.5 lb Date of Birth:  Sep 16, 1940     BSA:          1.852 m Patient Age:    79 years      BP:           104/58 mmHg Patient Gender: F             HR:           104 bpm. Exam Location:  Inpatient Procedure: Limited Echo, Cardiac Doppler and Color Doppler Indications:    I31.3 Pericardial effusion (noninflammatory)  History:        Patient has prior history of Echocardiogram examinations, most                 recent 01/08/2021. Abnormal ECG; Arrythmias:Atrial Fibrillation                 and Bradycardia. Pericardial effusion. Post pericardiocentesis.  Sonographer:    Sheralyn Boatman RDCS Referring Phys: [id number] [facility]  Sonographer Comments: Technically difficult study due to poor echo windows. Image acquisition challenging due to patient body habitus. Patient in high Fowler's position due to dyspnea. IMPRESSIONS  1. Left ventricular ejection fraction, by estimation, is 60 to 65%. The left ventricle has normal function. The left ventricle has no regional wall motion abnormalities.  2. A small pericardial effusion is present. There is no evidence of cardiac tamponade.  3. The inferior vena cava is normal in size with greater than 50% respiratory variability, suggesting right atrial pressure of 3 mmHg. Comparison(s): No significant change from prior study.  Conclusion(s)/Recommendation(s): Limited study to assess pericardial effusion. Effusion is small, most prominent near posterior LV but seen adjacent to RV as well. IVC is small and collapses. No echo evidence of tamponade. Pericardial effusion unchanged from study dated 01/08/21.  Pleural effusion smaller (current echo done post thoracentesis). Mild ascites noted as well. FINDINGS  Left Ventricle: Left ventricular ejection fraction, by estimation, is 60 to 65%. The left ventricle has normal function. The left ventricle has no regional wall motion abnormalities. The left ventricular internal cavity size was normal in size. Pericardium: A small pericardial effusion is present. There is no evidence of cardiac tamponade. Venous: The inferior vena cava is normal in size with greater than 50% respiratory variability, suggesting right atrial pressure of 3 mmHg. Additional Comments: There is a small pleural effusion in both left and right lateral regions. Mild ascites is present.  LV Volumes (MOD) LV vol d, MOD A2C: 67.4 ml LV vol d, MOD A4C: 52.9 ml LV vol s, MOD A2C: 22.0 ml LV vol s, MOD A4C: 19.4 ml LV SV MOD A2C:     45.4 ml LV SV MOD A4C:     52.9 ml LV SV MOD BP:      39.6 ml IVC IVC diam: 1.70 cm Jodelle Red MD Electronically signed by Jodelle Red MD Signature Date/Time: 01/20/2021/2:27:20 PM    Final    US THORACENTESIS ASP PLEURAL SPACE W/IMG GUIDE  Result Date: 01/20/2021 EXAM: ULTRASOUND GUIDED LEFT THORACENTESIS MEDICATIONS: 1% plain lidocaine, 5 mL COMPLICATIONS: None immediate. PROCEDURE: An ultrasound guided thoracentesis was thoroughly discussed with the patient and questions answered. The benefits, risks, alternatives and complications were also discussed. The patient understands and wishes to proceed with the procedure. Written consent was obtained. Ultrasound was performed to localize and mark an adequate pocket of fluid in the left chest. The area was then prepped and draped in the  normal sterile fashion. 1% Lidocaine was used for local anesthesia. Under ultrasound guidance a 6 Fr Safe-T-Centesis catheter was introduced. Thoracentesis was performed. The catheter was removed and a dressing applied. FINDINGS: A total of approximately 1 L of clear, amber/blood-tinged fluid was removed. Samples were sent to the laboratory as requested by the clinical team. IMPRESSION: Successful ultrasound guided left thoracentesis yielding 1 L of pleural fluid. Read by: Brayton El PA-C Electronically Signed   By: Malachy Moan M.D.   On: 01/20/2021 11:43    Cardiac Studies   Echo 01/20/21:  1. Left ventricular ejection fraction, by estimation, is 60 to 65%. The  left ventricle has normal function. The left ventricle has no regional  wall motion abnormalities.   2. A small pericardial effusion is present. There is no evidence of cardiac tamponade.   3. The inferior vena cava is normal in size with greater than 50%  respiratory variability, suggesting right atrial pressure of 3 mmHg.   Patient Profile     80 y.o. female with a hx of permanent atrial fibrillation, recent pericardial/pleural effusion with drainage who is being seen 01/20/2021 for the evaluation of atrial fibrillation with RVR.  Assessment & Plan    Afib with RVR Hypotension - telemetry with HR in the 60-80s - RVR initially treated with cardizem gtt - suspect due to pericarditis inflammation +/- HCAP - echo yesterday with EF 60-65% - cardizem gtt running at 5 mg/hr + 12.5 mg lopressor q 6hr - rates are controlled, consider consolidating lopressor to 25 mg BID   Pericardial effusion - s/p pericardiocentesis/thoracentesis - echo yesterday with small pericardial effusion - CXR with bilateral pleural effusions, left improved from thoracentesis - will continue eliquis   Bilateral pleural effusions PNA - she requests a pulmonology consult, I agree  - she would like her CXR at [facility] radiology  reviewed by pulmonology     Pericarditis - CRP 23.6, sed rate 30 - continue colchicine  - denies chest pain, but does report back pain   Chronic anticoagulation - small pericardial effusion - ok to continue eliquis unless procedure planned   OSA on CPAP Obesity HCAP - ABX per primary team      For questions or updates, please contact [facility] Please consult [email or web address] for contact info under      Signed, Marcelino Duster, PA  01/21/2021, 8:11 AM      Patient seen and examined   I agree with findings as noted above by A [facility]  Pt remains SOB     Lungs with decreased BS bilateral bases to 1/3 up Cardiac exam:   irreg irreg  No S3 Abd   Supple   Nontender Ext are with triv edema  CXR today with bilateral pleural effusions  Large    Agree with pulmonary consult as noted above   Still large pleural effusoin   Ques repeat thoracentesis  Keep on colchicine     Continue eliquis and rate control for afib  Agree with consolidating b blocker to bid   Dietrich Pates MD

## 2021-01-21 NOTE — Progress Notes (Signed)
Patient refused the use of CPAP for the evening.  

## 2021-01-21 NOTE — Consult Note (Signed)
NAME:  Rebecca Short, MRN:  [id number], DOB:  [birth year], LOS: 1 ADMISSION DATE:  01/19/2021, CONSULTATION DATE:  01/21/21 REFERRING MD:  Dr. Waymon Amato, CHIEF COMPLAINT:  Recurrent dsypnea    History of Present Illness:  Rebecca Short is a 80 y.o. female with a PMH significant for permanent A-fib anticoagulated with Eliquis, obesity,  OSA on CPAP, and macular degeneration who presented to the ED with complaints of generalized weakness and shortness of breath. Of note patient was seen and treated at this facility 7/31 - 8/2 for A-fib RVR and hypotension. Subsequently admitted again 8/11-8/19 for PNA with pericardial and pleura effusions. She underwent thoracentesis 8/14 with of exudative fluid removed. On 8/15 she underwent pericardiocentesis with of fluid drained.   On ED arrival for this admission 8/27 patient was seen with tachypnea and mild tachycardia with all other vital stable. CXR was obtained and revealed moderate left pleural effusion and small right pleural effusion. IR was consulted and patient underwent left thoracentesis with 1L of amber colored fluid removed. Fluid continues to remain exudative by Lights Criteria. PCCM consulted for further assistance in management of pleural effusion. Cardiology consulted for management of pericardial effusion   Pertinent  Medical History  Permanent A-fib, obesity,  OSA on CPAP, and macular degeneration  Significant recent Events:  7/31 - 8/2 admit for afib w/ RVR and hypotension 8/11 - 8/19 admit for PNA w/ pericardial and pleural effusions 8/11 CT chest with moderate left and small right pleural effusions. Adjacent bibasilar and lingular atelectasis. Increased, moderate-sized pericardial effusion. Biatrial enlargement. Mild pulmonary edema. No masses  8/14 thoracentesis - 950cc drained c/w exudate  8/15 pericardiocentesis - 570cc drained  8/16 pericardial drain removed  8/27 admit via ED repeat thoracentesis per IR with 1L of amber  exudative fluid removed   Interim History / Subjective:  Seen lying with reported improvement in dyspnea post thoracentesis Continues to report weakness and fatigue   Objective   Blood pressure (!) 108/53, pulse 93, temperature 98.4 F (36.9 C), temperature source Oral, resp. rate 20, height 5\' 2"  (1.575 m), weight 84.1 kg, SpO2 93 %.        Intake/Output Summary (Last 24 hours) at 01/21/2021 0954 Last data filed at 01/21/2021 0300 Gross per 24 hour  Intake 616.34 ml  Output --  Net 616.34 ml   Filed Weights   01/20/21 0834 01/21/21 0500  Weight: 84.1 kg 84.1 kg    Examination: General: Very pleasant elderly chronically ill appearing female lying in bed, in NAD HEENT: [place]/AT, MM pink/moist, PERRL,  Neuro: Alert and oriented x3, non-focal CV: s1s2 regular rate and rhythm, no murmur, rubs, or gallops,  PULM:  Diminished in bases, no increased work of breathing, no added breath sounds GI: soft, bowel sounds active in all 4 quadrants, non-tender, non-distended, tolerating oral diet  Extremities: warm/dry, no  edema  Skin: no rashes or lesions  Resolved Hospital Problem list     Assessment & Plan:  Recurrent bilateral, left greater than right, pleural effusions -8/11 CT chest with moderate left and small right pleural effusions. Adjacent bibasilar and lingular atelectasis. Increased, moderate-sized pericardial effusion. Biatrial enlargement with mild pulmonary edema. No masses  -Underwent left thoracentesis during previous admission 8/14 with  950cc drained c/w exudate. Cytology negative for malignant cells with reactive mesothelial cells present  -Repeat left thoracentesis by IR 8/28 with 1L of amber exudative fluid removed  Leukocytosis with concern of HCPA  -WBC 14.0 on admit but procal <0.10  and afebrile.  P: Continue supplemental oxygen for sat goal >90% Obtain ANA, rheumatoid factor and Quantiferon goal (very low suspicion for TB) CRP elevated at 23.4 Sed rate elevated  at 30 Ensure adequate pulmonary hygiene  Follow cultures  Follow cytology  Repeat CXR in AM  Remains on empiric Cefepime Trend CBC and fever curve    Best Practice   Per primary   Labs   CBC: Recent Labs  Lab 01/19/21 1803 01/20/21 0313 01/21/21 0116  WBC 15.3* 14.0* 11.6*  NEUTROABS 13.0*  --   --   HGB 14.8 12.8 12.1  HCT 45.2 38.9 37.2  MCV 89.3 88.8 88.8  PLT 241 206 209    Basic Metabolic Panel: Recent Labs  Lab 01/19/21 1803 01/20/21 0313 01/21/21 0116  NA 134* 135 136  K 3.2* 3.2* 3.9  CL 95* 101 103  CO2 28 26 30   GLUCOSE 118* 114* 126*  BUN 13 15 18   CREATININE 0.81 0.84 0.74  CALCIUM 8.4* 8.2* 8.1*  MG  --  1.9 1.9   GFR: Estimated Creatinine Clearance: 56.4 mL/min (by C-G formula based on SCr of 0.74 mg/dL). Recent Labs  Lab 01/19/21 1803 01/20/21 0313 01/21/21 0116  PROCALCITON  --  <0.10  --   WBC 15.3* 14.0* 11.6*  LATICACIDVEN 1.6  --   --     Liver Function Tests: Recent Labs  Lab 01/19/21 1803  AST 26  ALT 31  ALKPHOS 88  BILITOT 1.1  PROT 6.0*  ALBUMIN 2.8*   No results for input(s): LIPASE, AMYLASE in the last 168 hours. No results for input(s): AMMONIA in the last 168 hours.  ABG No results found for: PHART, PCO2ART, PO2ART, HCO3, TCO2, ACIDBASEDEF, O2SAT   Coagulation Profile: No results for input(s): INR, PROTIME in the last 168 hours.  Cardiac Enzymes: No results for input(s): CKTOTAL, CKMB, CKMBINDEX, TROPONINI in the last 168 hours.  HbA1C: Hgb A1c MFr Bld  Date/Time Value Ref Range Status  12/23/2020 10:12 PM 5.4 4.8 - 5.6 % Final    Comment:    (NOTE) Pre diabetes:          5.7%-6.4%  Diabetes:              >6.4%  Glycemic control for   <7.0% adults with diabetes     CBG: Recent Labs  Lab 01/19/21 1712  GLUCAP 122*    Review of Systems:   Please see the history of present illness. All other systems reviewed and are negative   Past Medical History:  She,  has a past medical history of  A-fib (HCC), Allergy, Atrial fibrillation (HCC), FH: cholecystectomy (1997), Macular degeneration, and OSA (obstructive sleep apnea).   Surgical History:   Past Surgical History:  Procedure Laterality Date   CHOLECYSTECTOMY     PERICARDIOCENTESIS N/A 01/07/2021   Procedure: PERICARDIOCENTESIS;  Surgeon: 01/21/21, MD;  Location: [facility];  Service: Cardiovascular;  Laterality: N/A;   REPLACEMENT TOTAL KNEE BILATERAL       Social History:   reports that she has never smoked. She has never used smokeless tobacco. She reports that she does not drink alcohol and does not use drugs.   Family History:  Her family history includes Cancer in her maternal grandmother; Dementia in her paternal grandmother; Diabetes in her brother; Heart Problems in her father; Kidney failure in her brother; Osteoporosis in her mother; Stroke in her father; Suicidality in an other family member. There is no history of Heart  attack.   Allergies Allergies  Allergen Reactions   Azithromycin Other (See Comments)    Pain in arm with IV infusion    Condrolite [Glucosamine-Chondroitin-Msm] Rash     Home Medications  Prior to Admission medications   Medication Sig Start Date End Date Taking? Authorizing Provider  acetaminophen (TYLENOL) 325 MG tablet Take 2 tablets (650 mg total) by mouth every 6 (six) hours as needed for moderate pain or mild pain. 12/25/20  Yes Arrien, York Ram, MD  CALCIUM CITRATE-VITAMIN D PO Take 1 tablet by mouth at bedtime.   Yes [provider]  carboxymethylcellulose (REFRESH PLUS) 0.5 % SOLN Place 1 drop into both eyes 2 (two) times daily.   Yes [provider]  cholecalciferol (VITAMIN D3) 25 MCG (1000 UNIT) tablet Take 1,000 Units by mouth daily.   Yes [provider]  colchicine 0.6 MG tablet Take 1 tablet (0.6 mg total) by mouth daily. 01/11/21  Yes Elgergawy, Leana Roe, MD  ELIQUIS 5 MG TABS tablet TAKE 1 TABLET BY MOUTH TWICE A  DAY Patient taking differently: Take 5 mg by mouth 2 (two) times daily. 12/04/20  Yes Corky Crafts, MD  EPIPEN 2-PAK 0.3 MG/0.3ML SOAJ injection Inject 0.3 mg into the muscle once as needed for anaphylaxis (severe allergic reaction). 07/05/14  Yes [provider]  furosemide (LASIX) 40 MG tablet Take 1 tablet (40 mg total) by mouth daily. 01/11/21 02/10/21 Yes Elgergawy, Leana Roe, MD  Multiple Vitamin (MULTIVITAMIN WITH MINERALS) TABS tablet Take 1 tablet by mouth every morning.   Yes [provider]  Multiple Vitamins-Minerals (PRESERVISION AREDS PO) Take 1 capsule by mouth 2 (two) times daily.   Yes [provider]  PRESCRIPTION MEDICATION every 14 (fourteen) days. Allergy shots administered by Dr. Moscow Callas   Yes [provider]  metoprolol tartrate (LOPRESSOR) 25 MG tablet Take 1 tablet (25 mg total) by mouth 2 (two) times daily. 01/18/21   Corky Crafts, MD     Critical care time: N/A  Alisi Lupien D. Tiburcio Pea, NP-C [facility] Personal contact information can be found on Amion  01/21/2021, 10:23 AM

## 2021-01-22 ENCOUNTER — Inpatient Hospital Stay (HOSPITAL_COMMUNITY): Payer: Medicare Other

## 2021-01-22 DIAGNOSIS — I313 Pericardial effusion (noninflammatory): Secondary | ICD-10-CM | POA: Diagnosis not present

## 2021-01-22 DIAGNOSIS — J90 Pleural effusion, not elsewhere classified: Secondary | ICD-10-CM | POA: Diagnosis not present

## 2021-01-22 DIAGNOSIS — I4891 Unspecified atrial fibrillation: Secondary | ICD-10-CM | POA: Diagnosis not present

## 2021-01-22 LAB — ANA: Anti Nuclear Antibody (ANA): NEGATIVE

## 2021-01-22 LAB — APTT: aPTT: 39 seconds — ABNORMAL HIGH (ref 24–36)

## 2021-01-22 LAB — HEPARIN LEVEL (UNFRACTIONATED): Heparin Unfractionated: >1.1 IU/mL — ABNORMAL HIGH (ref 0.30–0.70)

## 2021-01-22 LAB — PH, BODY FLUID: pH, Body Fluid: 7.2

## 2021-01-22 LAB — CYTOLOGY - NON PAP

## 2021-01-22 MED ORDER — METOPROLOL TARTRATE 12.5 MG HALF TABLET
12.5000 mg | ORAL_TABLET | Freq: Two times a day (BID) | ORAL | Status: DC
  Administered 2021-01-22 – 2021-01-25 (×7): 12.5 mg via ORAL
  Filled 2021-01-22 (×7): qty 1

## 2021-01-22 MED ORDER — HEPARIN (PORCINE) 25000 UT/250ML-% IV SOLN
1550.0000 [IU]/h | INTRAVENOUS | Status: AC
  Administered 2021-01-22: 1350 [IU]/h via INTRAVENOUS
  Administered 2021-01-23: 1450 [IU]/h via INTRAVENOUS
  Administered 2021-01-24: 1500 [IU]/h via INTRAVENOUS
  Filled 2021-01-22 (×3): qty 250

## 2021-01-22 NOTE — Consult Note (Signed)
   [facility] Inpatient Consult   01/22/2021  Rebecca Short March 11, 1941 [id number]  [facility] [[facility]]  Accountable Care Organization [ACO] Patient: Medicare CMS DCE  Primary Care Provider:  Daisy Floro, MD with [facility]  Patient screened for less than 30 days readmission hospitalization with noted to assess for potential [facility]  [[facility]] Care Management service needs for post hospital transition.    Plan:  Continue to follow progress and disposition to assess for post hospital care management needs.    For questions contact:   Charlesetta Shanks, RN BSN CCM [facility]  [phone] business mobile phone Toll free office [phone]  Fax number: [phone] [email or web address]

## 2021-01-22 NOTE — Progress Notes (Signed)
Rebecca Short  TWS:[id number] DOB: 22-Dec-1940 DOA: 01/19/2021 PCP: Lawerance Cruel, MD    Brief Narrative:  80 year old with a history of permanent atrial fibrillation, obesity, and OSA who presented to the ED with severe ongoing generalized weakness and shortness of breath, suffering with blood pressures into the 80s at home.  She was admitted in July with atrial fibrillation.  She was admitted in August with pleural effusions and a pericardial effusion.  At that time 1 L of an exudative fluid was drained from her left pleural space.  She was discharged on colchicine for her pericardial effusion.  In the ED a CXR noted a similar moderate left pleural effusion and a small increase in an otherwise small right pleural effusion.  Cardiology, IR and pulmonology consulted.  S/p left diagnostic and therapeutic thoracentesis 1 L on 8/28.  Significant Events:  7/31 - 8/2 admit for afib w/ RVR and hypotension 8/11 - 8/19 admit for PNA w/ pericardial and pleural effusions  8/14 thoracentesis - 950cc drained c/w exudate  8/15 pericardiocentesis - 570cc drained  8/16 pericardial drain removed  8/27 admit via ED  Consultants:  [facility] Cardiology Interventional radiology Pulmonology.  Code Status: FULL CODE  Antimicrobials:  Vancomycin 8/27 > Cefepime 8/27 >  DVT prophylaxis: Eliquis  Subjective: Expressed dissatisfaction with overnight nursing.  Ongoing dyspnea even at rest or speaking.  Dyspnea severity fluctuating.  Had a large BM this morning.  Assessment & Plan:  Chronic atrial fibrillation with acute RVR Cardiology following.  RVR suspected due to pericarditis +/- HCAP.  Initially treated with Cardizem drip.  Rate is mildly uncontrolled.  Cardizem is continued at 5 mg/h and to be transitioned to oral when no further procedures warranted.  Metoprolol decreased to 12.5 Mg twice daily due to soft blood pressures.  Remains on apixaban but will need to bridge with IV heparin if needs  procedures-defer to proceduralist to change as appropriate.    Persistent/recurrent bilateral pleural effusions, left greater than sign right/HCAP Had a 1 L thoracentesis noting exudative fluid on left earlier this month -has recurred - tx for PNA earlier this month - CT Aug 2022 w/o evidence of lung mass -follow-up thoracentesis labs pending  -continue empiric antibiotic -Pulmonology input appreciated.  Chemistries consistent with a weak exudate.  Cytology negative so far, repeat from 8/28 is pending.  They are suspecting that this fluid is inflammatory, unclear cause.  If this is empyema, she will need tube drainage.  Checking ANA, RF.  CRP and ESR elevated.  QuantiFERON gold pending but low suspicion for TB.  Continue with empiric antibiotics.  Addendum: I just now discussed with Dr. Lamonte Sakai who recommends changing to IV heparin drip, holding Eliquis for possible thoracentesis.  Pericardial effusion -persisting/pericarditis This was noted earlier this month during her prior admission and was treated with colchicine -no evidence of tamponade on -Cardiology monitoring -continue colchicine  S/p pericardiocentesis during earlier admission Echo 8/28 with small pericardial effusion. CRP 23.6, ESR 30.  Continue colchicine.  Tachycardia induced cardiomyopathy Suffered recent acute CHF episode felt to be due to tachycardia -was previously diagnosed and is usually on Lasix 40 mg daily  Possible pneumonia WBC elevated at presentation but patient with no fever - follow-up thoracentesis labs -procalcitonin < 0.10 - stop vancomycin but continue cefepime for now until thoracentesis fluid results are available  OSA on CPAP Continue usual CPAP dosing   Family Communication: None at bedside this morning. Status is: Inpatient  Remains inpatient appropriate because:Inpatient level of  care appropriate due to severity of illness  Dispo: The patient is from: Home              Anticipated d/c is to:   Unclear              Patient currently is not medically stable to d/c.   Difficult to place patient No    Objective:  Vitals:   01/22/21 0418 01/22/21 0700 01/22/21 0900 01/22/21 1225  BP:  108/69  110/88  Pulse:  98  84  Resp:  (!) 27  (!) 34  Temp:   98.6 F (37 C) 99.1 F (37.3 C)  TempSrc:   Oral Oral  SpO2:  94%  94%  Weight: 82.6 kg     Height:          Examination: General exam: Elderly female, moderately built and obese lying on comfortably propped up in bed with mild increased work of breathing. Respiratory system: Clear to auscultation anteriorly but decreased breath sounds bilateral bases and mid zones posteriorly.  No wheezing, rhonchi or crackles.  Mild increased work of breathing which worsens even when she speaks or moves around a little bit. Cardiovascular system: S1 and S2 heard, irregularly irregular and mildly tachycardic.  No JVD, murmurs or pedal edema.  Telemetry personally reviewed: A. fib with RVR in the 110s. Gastrointestinal system: Abdomen is nondistended, soft and nontender. No organomegaly or masses felt. Normal bowel sounds heard. Central nervous system: Alert and oriented. No focal neurological deficits. Extremities: Symmetric 5 x 5 power. Skin: No rashes, lesions or ulcers Psychiatry: Judgement and insight appear normal. Mood & affect appropriate.    CBC: Recent Labs  Lab 01/19/21 1803 01/20/21 0313 01/21/21 0116  WBC 15.3* 14.0* 11.6*  NEUTROABS 13.0*  --   --   HGB 14.8 12.8 12.1  HCT 45.2 38.9 37.2  MCV 89.3 88.8 88.8  PLT 241 206 562   Basic Metabolic Panel: Recent Labs  Lab 01/19/21 1803 01/20/21 0313 01/21/21 0116  NA 134* 135 136  K 3.2* 3.2* 3.9  CL 95* 101 103  CO2 $Re'28 26 30  'kGm$ GLUCOSE 118* 114* 126*  BUN $Re'13 15 18  'XYO$ CREATININE 0.81 0.84 0.74  CALCIUM 8.4* 8.2* 8.1*  MG  --  1.9 1.9   GFR: Estimated Creatinine Clearance: 55.9 mL/min (by C-G formula based on SCr of 0.74 mg/dL).  Liver Function Tests: Recent Labs   Lab 01/19/21 1803  AST 26  ALT 31  ALKPHOS 88  BILITOT 1.1  PROT 6.0*  ALBUMIN 2.8*    HbA1C: Hgb A1c MFr Bld  Date/Time Value Ref Range Status  12/23/2020 10:12 PM 5.4 4.8 - 5.6 % Final    Comment:    (NOTE) Pre diabetes:          5.7%-6.4%  Diabetes:              >6.4%  Glycemic control for   <7.0% adults with diabetes     CBG: Recent Labs  Lab 01/19/21 1712  GLUCAP 122*    Recent Results (from the past 240 hour(s))  Blood culture (routine x 2)     Status: None (Preliminary result)   Collection Time: 01/19/21  5:17 PM   Specimen: BLOOD  Result Value Ref Range Status   Specimen Description BLOOD IV START  Final   Special Requests   Final    BOTTLES DRAWN AEROBIC AND ANAEROBIC Blood Culture results may not be optimal due to an inadequate volume of blood  received in culture bottles   Culture   Final    NO GROWTH 3 DAYS Performed at [facility], [address] [place], [place] [address]    Report Status PENDING  Incomplete  Resp Panel by RT-PCR (Flu A&B, Covid) Nasopharyngeal Swab     Status: None   Collection Time: 01/19/21  6:03 PM   Specimen: Nasopharyngeal Swab; Nasopharyngeal(NP) swabs in vial transport medium  Result Value Ref Range Status   SARS Coronavirus 2 by RT PCR NEGATIVE NEGATIVE Final    Comment: (NOTE) SARS-CoV-2 target nucleic acids are NOT DETECTED.  The SARS-CoV-2 RNA is generally detectable in upper respiratory specimens during the acute phase of infection. The lowest concentration of SARS-CoV-2 viral copies this assay can detect is 138 copies/mL. A negative result does not preclude SARS-Cov-2 infection and should not be used as the sole basis for treatment or other patient management decisions. A negative result may occur with  improper specimen collection/handling, submission of specimen other than nasopharyngeal swab, presence of viral mutation(s) within the areas targeted by this assay, and inadequate number of  viral copies(<138 copies/mL). A negative result must be combined with clinical observations, patient history, and epidemiological information. The expected result is Negative.  Fact Sheet for Patients:  BloggerCourse.com  Fact Sheet for Healthcare Providers:  SeriousBroker.it  This test is no t yet approved or cleared by the [place] FDA and  has been authorized for detection and/or diagnosis of SARS-CoV-2 by FDA under an Emergency Use Authorization (EUA). This EUA will remain  in effect (meaning this test can be used) for the duration of the COVID-19 declaration under Section 564(b)(1) of the Act, 21 U.S.C.section 360bbb-3(b)(1), unless the authorization is terminated  or revoked sooner.       Influenza A by PCR NEGATIVE NEGATIVE Final   Influenza B by PCR NEGATIVE NEGATIVE Final    Comment: (NOTE) The Xpert Xpress SARS-CoV-2/FLU/RSV plus assay is intended as an aid in the diagnosis of influenza from Nasopharyngeal swab specimens and should not be used as a sole basis for treatment. Nasal washings and aspirates are unacceptable for Xpert Xpress SARS-CoV-2/FLU/RSV testing.  Fact Sheet for Patients: BloggerCourse.com  Fact Sheet for Healthcare Providers: SeriousBroker.it  This test is not yet approved or cleared by the [place] FDA and has been authorized for detection and/or diagnosis of SARS-CoV-2 by FDA under an Emergency Use Authorization (EUA). This EUA will remain in effect (meaning this test can be used) for the duration of the COVID-19 declaration under Section 564(b)(1) of the Act, 21 U.S.C. section 360bbb-3(b)(1), unless the authorization is terminated or revoked.  Performed at [facility], [address] [place], [place] [address]   Body fluid culture w Gram Stain     Status: None (Preliminary result)   Collection Time: 01/20/21 11:36 AM    Specimen: PATH Cytology Pleural fluid  Result Value Ref Range Status   Specimen Description PLEURAL FLUID  Final   Special Requests CYTO  Final   Gram Stain   Final    FEW WBC PRESENT,BOTH PMN AND MONONUCLEAR NO ORGANISMS SEEN    Culture   Final    NO GROWTH 2 DAYS Performed at [facility], [address], [place], [place] [address]    Report Status PENDING  Incomplete     Scheduled Meds:  apixaban  5 mg Oral BID   colchicine  0.6 mg Oral Daily   metoprolol tartrate  12.5 mg Oral BID  polyvinyl alcohol  1 drop Both Eyes BID   sodium chloride flush  3 mL Intravenous Q12H   Continuous Infusions:  ceFEPime (MAXIPIME) IV 2 g (01/22/21 0859)   diltiazem (CARDIZEM) infusion 5 mg/hr (01/21/21 2132)     LOS: 2 days   Vernell Leep, MD, [place], [facility]. [facility]  To contact the attending provider between 7A-7P or the covering provider during after hours 7P-7A, please log into the web site [email or web address] and access using universal [place] password for that web site. If you do not have the password, please call the hospital operator.

## 2021-01-22 NOTE — Progress Notes (Addendum)
Progress Note  Patient Name: Rebecca Short Date of Encounter: 01/22/2021  Primary Cardiologist: [name], MD   Subjective   Remains SOB today. Seen by pulmonary yesterday with plans to follow cytology>>may require tube drainage if empyema.    Inpatient Medications    Scheduled Meds:  apixaban  5 mg Oral BID   colchicine  0.6 mg Oral Daily   metoprolol tartrate  25 mg Oral BID   polyvinyl alcohol  1 drop Both Eyes BID   sodium chloride flush  3 mL Intravenous Q12H   Continuous Infusions:  ceFEPime (MAXIPIME) IV 2 g (01/21/21 2054)   diltiazem (CARDIZEM) infusion 5 mg/hr (01/21/21 2132)   PRN Meds: acetaminophen **OR** [DISCONTINUED] acetaminophen   Vital Signs    Vitals:   01/22/21 0010 01/22/21 0417 01/22/21 0418 01/22/21 0700  BP: 93/72 90/68  108/69  Pulse: 94 84  98  Resp: (!) 32 (!) 31    Temp:  98 F (36.7 C)    TempSrc:  Oral    SpO2: 93% 97%  94%  Weight:   82.6 kg   Height:        Intake/Output Summary (Last 24 hours) at 01/22/2021 0807 Last data filed at 01/22/2021 0600 Gross per 24 hour  Intake 458.31 ml  Output 200 ml  Net 258.31 ml   Filed Weights   01/20/21 0834 01/21/21 0500 01/22/21 0418  Weight: 84.1 kg 84.1 kg 82.6 kg    Physical Exam   General: Well developed, well nourished, NAD Neck: Negative for carotid bruits. No JVD Lungs: Diminished in upper and lower lobes. Breathing is labored with communication.  Cardiovascular: Irregularly irregular. No murmurs Abdomen: Soft, non-tender, non-distended. No obvious abdominal masses. Extremities: Trace edema. Radial pulses 2+ bilaterally Neuro: Alert and oriented. No focal deficits. No facial asymmetry. MAE spontaneously. Psych: Responds to questions appropriately with normal affect.    Labs    Chemistry Recent Labs  Lab 01/19/21 1803 01/20/21 0313 01/21/21 0116  NA 134* 135 136  K 3.2* 3.2* 3.9  CL 95* 101 103  CO2 28 26 30   GLUCOSE 118* 114* 126*  BUN 13 15 18    CREATININE 0.81 0.84 0.74  CALCIUM 8.4* 8.2* 8.1*  PROT 6.0*  --   --   ALBUMIN 2.8*  --   --   AST 26  --   --   ALT 31  --   --   ALKPHOS 88  --   --   BILITOT 1.1  --   --   GFRNONAA >60 >60 >60  ANIONGAP 11 8 3*     Hematology Recent Labs  Lab 01/19/21 1803 01/20/21 0313 01/21/21 0116  WBC 15.3* 14.0* 11.6*  RBC 5.06 4.38 4.19  HGB 14.8 12.8 12.1  HCT 45.2 38.9 37.2  MCV 89.3 88.8 88.8  MCH 29.2 29.2 28.9  MCHC 32.7 32.9 32.5  RDW 14.2 14.2 14.4  PLT 241 206 209    Cardiac EnzymesNo results for input(s): TROPONINI in the last 168 hours. No results for input(s): TROPIPOC in the last 168 hours.   BNP Recent Labs  Lab 01/19/21 1803  BNP 102.3*     DDimer No results for input(s): DDIMER in the last 168 hours.   Radiology    DG Chest 1 View  Result Date: 01/20/2021 CLINICAL DATA:  Status post left thoracentesis. EXAM: CHEST  1 VIEW COMPARISON:  January 19, 2021. FINDINGS: No pneumothorax is noted. Left pleural effusion is slightly smaller  status post thoracentesis. IMPRESSION: No pneumothorax status post left thoracentesis. Electronically Signed   By: Lupita Raider M.D.   On: 01/20/2021 12:38   DG Chest Port 1 View  Result Date: 01/21/2021 CLINICAL DATA:  Pleural effusion. EXAM: PORTABLE CHEST 1 VIEW COMPARISON:  January 20, 2021. FINDINGS: Stable cardiomediastinal silhouette. No pneumothorax is noted. Bilateral pleural effusions are noted with associated bibasilar atelectasis. Bony thorax is unremarkable. IMPRESSION: Stable bilateral pleural effusions with associated atelectasis. Electronically Signed   By: Lupita Raider M.D.   On: 01/21/2021 08:08   ECHOCARDIOGRAM LIMITED  Result Date: 01/20/2021    ECHOCARDIOGRAM LIMITED REPORT   Patient Name:   Rebecca Short Date of Exam: 01/20/2021 Medical Rec #:  [id number]     Height:       62.0 in Accession #:    [id number]    Weight:       185.5 lb Date of Birth:  09/10/1940     BSA:          1.852 m Patient Age:    79  years      BP:           104/58 mmHg Patient Gender: F             HR:           104 bpm. Exam Location:  Inpatient Procedure: Limited Echo, Cardiac Doppler and Color Doppler Indications:    I31.3 Pericardial effusion (noninflammatory)  History:        Patient has prior history of Echocardiogram examinations, most                 recent 01/08/2021. Abnormal ECG; Arrythmias:Atrial Fibrillation                 and Bradycardia. Pericardial effusion. Post pericardiocentesis.  Sonographer:    Sheralyn Boatman RDCS Referring Phys: [id number] [facility]  Sonographer Comments: Technically difficult study due to poor echo windows. Image acquisition challenging due to patient body habitus. Patient in high Fowler's position due to dyspnea. IMPRESSIONS  1. Left ventricular ejection fraction, by estimation, is 60 to 65%. The left ventricle has normal function. The left ventricle has no regional wall motion abnormalities.  2. A small pericardial effusion is present. There is no evidence of cardiac tamponade.  3. The inferior vena cava is normal in size with greater than 50% respiratory variability, suggesting right atrial pressure of 3 mmHg. Comparison(s): No significant change from prior study. Conclusion(s)/Recommendation(s): Limited study to assess pericardial effusion. Effusion is small, most prominent near posterior LV but seen adjacent to RV as well. IVC is small and collapses. No echo evidence of tamponade. Pericardial effusion unchanged from study dated 01/08/21. Pleural effusion smaller (current echo done post thoracentesis). Mild ascites noted as well. FINDINGS  Left Ventricle: Left ventricular ejection fraction, by estimation, is 60 to 65%. The left ventricle has normal function. The left ventricle has no regional wall motion abnormalities. The left ventricular internal cavity size was normal in size. Pericardium: A small pericardial effusion is present. There is no evidence of cardiac tamponade. Venous: The inferior  vena cava is normal in size with greater than 50% respiratory variability, suggesting right atrial pressure of 3 mmHg. Additional Comments: There is a small pleural effusion in both left and right lateral regions. Mild ascites is present.  LV Volumes (MOD) LV vol d, MOD A2C: 67.4 ml LV vol d, MOD A4C: 52.9 ml LV vol s, MOD A2C: 22.0 ml LV  vol s, MOD A4C: 19.4 ml LV SV MOD A2C:     45.4 ml LV SV MOD A4C:     52.9 ml LV SV MOD BP:      39.6 ml IVC IVC diam: 1.70 cm Jodelle Red MD Electronically signed by Jodelle Red MD Signature Date/Time: 01/20/2021/2:27:20 PM    Final    US THORACENTESIS ASP PLEURAL SPACE W/IMG GUIDE  Result Date: 01/20/2021 EXAM: ULTRASOUND GUIDED LEFT THORACENTESIS MEDICATIONS: 1% plain lidocaine, 5 mL COMPLICATIONS: None immediate. PROCEDURE: An ultrasound guided thoracentesis was thoroughly discussed with the patient and questions answered. The benefits, risks, alternatives and complications were also discussed. The patient understands and wishes to proceed with the procedure. Written consent was obtained. Ultrasound was performed to localize and mark an adequate pocket of fluid in the left chest. The area was then prepped and draped in the normal sterile fashion. 1% Lidocaine was used for local anesthesia. Under ultrasound guidance a 6 Fr Safe-T-Centesis catheter was introduced. Thoracentesis was performed. The catheter was removed and a dressing applied. FINDINGS: A total of approximately 1 L of clear, amber/blood-tinged fluid was removed. Samples were sent to the laboratory as requested by the clinical team. IMPRESSION: Successful ultrasound guided left thoracentesis yielding 1 L of pleural fluid. Read by: Brayton El PA-C Electronically Signed   By: Malachy Moan M.D.   On: 01/20/2021 11:43    Telemetry    01/22/21 AF with rates in the 100's - Personally Reviewed  ECG    No new tracing as of 01/22/21 - Personally Reviewed  Cardiac Studies   Echo  01/20/21:  1. Left ventricular ejection fraction, by estimation, is 60 to 65%. The  left ventricle has normal function. The left ventricle has no regional  wall motion abnormalities.   2. A small pericardial effusion is present. There is no evidence of cardiac tamponade.   3. The inferior vena cava is normal in size with greater than 50%  respiratory variability, suggesting right atrial pressure of 3 mmHg.   Patient Profile     80 y.o. female with a hx of permanent atrial fibrillation, recent pericardial/pleural effusion with drainage who is being seen 01/20/2021 for the evaluation of atrial fibrillation with RVR.  Assessment & Plan    1. Atrial fibrillation with RVR: -Telemetry with HRs in the 100's   -Treated with IV diltiazem  -Felt to be secondary to pericarditis inflammation +/- HCAP -Stable echo from 01/20/21 -Continue Toprol however will decrease to 12.5mg  BID due to soft BPs  -AC with Eliquis>>need to hold if procedure?   2. Pericardial effusion: -s/p pericardiocentesis/thoracentesis -Echo 01/20/21 with small pericardial effusion  -CXR with bilateral pleural effusions, left improved from thoracentesis -Continue eliquis  3. Bilateral pleural effusions: -Seen by pulmonology yesterday   4. Pericarditis: -Inflammatory markers positive with sed rate at 30/CRP 23.6 -Continue colchicine  -No reports of chest pain    Signed, Georgie Chard NP-C [facility] Pager: [phone] 01/22/2021, 8:07 AM     For questions or updates, please contact   Please consult [email or web address] for contact info under Cardiology/STEMI.  Personally seen and examined. Agree with APP above with the following comments: AF RVR in the setting of suspected lung pathology. If procedure is warranted; transition to heparin will be made (pending at this time) When no further procedures are required, will transition IV diltiazem to PO.  Reduced additional metoprolol at this time. No pericardial friction rub or  sx.  Riley Lam, MD Cardiologist [facility]  [address] [place], [place] [address] [phone]  12:46 PM

## 2021-01-22 NOTE — Progress Notes (Signed)
Pt refused cpap for the night.  

## 2021-01-22 NOTE — Progress Notes (Signed)
NAME:  Rebecca Short, MRN:  [id number], DOB:  06-Jun-1940, LOS: 2 ADMISSION DATE:  01/19/2021, CONSULTATION DATE:  01/21/21 REFERRING MD:  Dr. Algis Liming, CHIEF COMPLAINT:  Recurrent dsypnea    History of Present Illness:  Rebecca Short is a 80 y.o. female with a PMH significant for permanent A-fib anticoagulated with Eliquis, obesity,  OSA on CPAP, and macular degeneration who presented to the ED with complaints of generalized weakness and shortness of breath. Of note patient was seen and treated at this facility 7/31 - 8/2 for A-fib RVR and hypotension. Subsequently admitted again 8/11-8/19 for PNA with pericardial and pleura effusions. She underwent thoracentesis 8/14 with 992ml of exudative fluid removed. On 8/15 she underwent pericardiocentesis with 542ml of fluid drained.   On ED arrival for this admission 8/27 patient was seen with tachypnea and mild tachycardia with all other vital stable. CXR was obtained and revealed moderate left pleural effusion and small right pleural effusion. IR was consulted and patient underwent left thoracentesis with 1L of amber colored fluid removed. Fluid continues to remain exudative by Lights Criteria. PCCM consulted for further assistance in management of pleural effusion. Cardiology consulted for management of pericardial effusion   Pertinent  Medical History  Permanent A-fib, obesity,  OSA on CPAP, and macular degeneration  Significant recent Events:  7/31 - 8/2 admit for afib w/ RVR and hypotension 8/11 - 8/19 admit for PNA w/ pericardial and pleural effusions 8/11 CT chest with moderate left and small right pleural effusions. Adjacent bibasilar and lingular atelectasis. Increased, moderate-sized pericardial effusion. Biatrial enlargement. Mild pulmonary edema. No masses  8/14 thoracentesis - 950cc drained c/w exudate  8/15 pericardiocentesis - 570cc drained  8/16 pericardial drain removed  8/27 admit via ED repeat thoracentesis per IR with 1L of amber  exudative fluid removed   Interim History / Subjective:   About the same - still with SOB just laying still in the bed. Severe when she moved to bedside commode. No sputum.   Objective   Blood pressure 108/69, pulse 98, temperature 98.6 F (37 C), temperature source Oral, resp. rate (!) 27, height $RemoveBe'5\' 2"'NhMMPrJIq$  (1.575 m), weight 82.6 kg, SpO2 94 %.        Intake/Output Summary (Last 24 hours) at 01/22/2021 1027 Last data filed at 01/22/2021 0600 Gross per 24 hour  Intake 458.31 ml  Output 200 ml  Net 258.31 ml   Filed Weights   01/20/21 0834 01/21/21 0500 01/22/21 0418  Weight: 84.1 kg 84.1 kg 82.6 kg    Examination: General: Elderly woman comfortable laying in bed HEENT: OP clear Neuro: Awake, alert, moves all extremities, nonfocal CV: Irregularly irregular, distant, 110 PULM: Decreased bilateral breath sounds especially on the left GI: Nondistended, nontender, positive bowel sounds Extremities: No edema Skin: No rash  Resolved Hospital Problem list     Assessment & Plan:  Recurrent bilateral, left greater than right, pleural effusions -8/11 CT chest with moderate left and small right pleural effusions. Adjacent bibasilar and lingular atelectasis. Increased, moderate-sized pericardial effusion. Biatrial enlargement with mild pulmonary edema. No masses  -Underwent left thoracentesis during previous admission 8/14 with  950cc drained c/w exudate. Cytology negative for malignant cells with reactive mesothelial cells present  -Repeat left thoracentesis by IR 8/28 with 1L of amber exudative fluid removed  -Etiology here unclear but suspect acute inflammatory process based on pleural fluid chemistries and also her pericardial effusion.  Consider also malignancy although cytology negative so far, 8/28 pending Leukocytosis with concern of HCPA  -  WBC 14.0 on admit but procal <0.10 and afebrile.  Atrial fibrillation  P: -Await cytology, culture data to guide next steps.  She would be  tube drainage if this is an empyema.  May get therapeutic benefit from a repeat thoracentesis to drain fluid completely.  Could perform if studies do not support chest tube. -ANA, RF are pending.  CRP and ESR were elevated.  QuantiFERON gold is pending as well although very low suspicion for TB. -Have not started corticosteroids but may benefit depending on the autoimmune work-up above and if no evidence pleural infection. -Agree with empiric antibiotics for now -If we decide anticoagulate then would use heparin until we are sure she does not need either chest tube or repeat thoracentesis.   Labs   CBC: Recent Labs  Lab 01/19/21 1803 01/20/21 0313 01/21/21 0116  WBC 15.3* 14.0* 11.6*  NEUTROABS 13.0*  --   --   HGB 14.8 12.8 12.1  HCT 45.2 38.9 37.2  MCV 89.3 88.8 88.8  PLT 241 206 130    Basic Metabolic Panel: Recent Labs  Lab 01/19/21 1803 01/20/21 0313 01/21/21 0116  NA 134* 135 136  K 3.2* 3.2* 3.9  CL 95* 101 103  CO2 $Re'28 26 30  'kVc$ GLUCOSE 118* 114* 126*  BUN $Re'13 15 18  'BiZ$ CREATININE 0.81 0.84 0.74  CALCIUM 8.4* 8.2* 8.1*  MG  --  1.9 1.9   GFR: Estimated Creatinine Clearance: 55.9 mL/min (by C-G formula based on SCr of 0.74 mg/dL). Recent Labs  Lab 01/19/21 1803 01/20/21 0313 01/21/21 0116  PROCALCITON  --  <0.10  --   WBC 15.3* 14.0* 11.6*  LATICACIDVEN 1.6  --   --     Liver Function Tests: Recent Labs  Lab 01/19/21 1803  AST 26  ALT 31  ALKPHOS 88  BILITOT 1.1  PROT 6.0*  ALBUMIN 2.8*   HbA1C: Hgb A1c MFr Bld  Date/Time Value Ref Range Status  12/23/2020 10:12 PM 5.4 4.8 - 5.6 % Final    Comment:    (NOTE) Pre diabetes:          5.7%-6.4%  Diabetes:              >6.4%  Glycemic control for   <7.0% adults with diabetes     CBG: Recent Labs  Lab 01/19/21 1712  GLUCAP 122*     Critical care time: N/A    Baltazar Apo, MD, PhD 01/22/2021, 10:27 AM [facility] [phone] or if no answer before 7:00PM call  [phone] For any issues after 7:00PM please call eLink [phone]

## 2021-01-22 NOTE — Progress Notes (Signed)
ANTICOAGULATION CONSULT NOTE - Initial Consult  Pharmacy Consult for heparin Indication: atrial fibrillation  Allergies  Allergen Reactions   Azithromycin Other (See Comments)    Pain in arm with IV infusion    Condrolite [Glucosamine-Chondroitin-Msm] Rash    Patient Measurements: Height: 5\' 2"  (157.5 cm) Weight: 82.6 kg (182 lb 1.6 oz) IBW/kg (Calculated) : 50.1 Heparin Dosing Weight: 69 kg  Vital Signs: Temp: 99.1 F (37.3 C) (08/30 1225) Temp Source: Oral (08/30 1225) BP: 110/88 (08/30 1225) Pulse Rate: 84 (08/30 1225)  Labs: Recent Labs    01/19/21 1803 01/20/21 0313 01/21/21 0116  HGB 14.8 12.8 12.1  HCT 45.2 38.9 37.2  PLT 241 206 209  CREATININE 0.81 0.84 0.74    Estimated Creatinine Clearance: 55.9 mL/min (by C-G formula based on SCr of 0.74 mg/dL).   Medical History: Past Medical History:  Diagnosis Date   A-fib [facility])    Allergy    Atrial fibrillation (HCC)    FH: cholecystectomy 1997   Macular degeneration    OSA (obstructive sleep apnea)    mild with total AHI 7.25/hr and severe during REM sleep at 35/hr now on CPAP at 6cm H2O    Medications:  Medications Prior to Admission  Medication Sig Dispense Refill Last Dose   acetaminophen (TYLENOL) 325 MG tablet Take 2 tablets (650 mg total) by mouth every 6 (six) hours as needed for moderate pain or mild pain.   01/19/2021   CALCIUM CITRATE-VITAMIN D PO Take 1 tablet by mouth at bedtime.   Past Week   carboxymethylcellulose (REFRESH PLUS) 0.5 % SOLN Place 1 drop into both eyes 2 (two) times daily.   01/18/2021   cholecalciferol (VITAMIN D3) 25 MCG (1000 UNIT) tablet Take 1,000 Units by mouth daily.   Past Week   colchicine 0.6 MG tablet Take 1 tablet (0.6 mg total) by mouth daily. 15 tablet 0 01/19/2021 at am   ELIQUIS 5 MG TABS tablet TAKE 1 TABLET BY MOUTH TWICE A DAY (Patient taking differently: Take 5 mg by mouth 2 (two) times daily.) 60 tablet 6 01/19/2021 at 0800   EPIPEN 2-PAK 0.3 MG/0.3ML SOAJ  injection Inject 0.3 mg into the muscle once as needed for anaphylaxis (severe allergic reaction).  0 never   furosemide (LASIX) 40 MG tablet Take 1 tablet (40 mg total) by mouth daily. 30 tablet 0 01/19/2021 at am   Multiple Vitamin (MULTIVITAMIN WITH MINERALS) TABS tablet Take 1 tablet by mouth every morning.   Past Week   Multiple Vitamins-Minerals (PRESERVISION AREDS PO) Take 1 capsule by mouth 2 (two) times daily.   Past Week   PRESCRIPTION MEDICATION every 14 (fourteen) days. Allergy shots administered by Dr. 01/21/2021   month ago   metoprolol tartrate (LOPRESSOR) 25 MG tablet Take 1 tablet (25 mg total) by mouth 2 (two) times daily. 180 tablet 3 01/18/2021 at 1500    Assessment: 79 yof on apixaban for Afib with recurrent bilateral pleural effusions who may need a repeat thoracentesis. Pharmacy consulted to transition to IV heparin while apixaban on hold. Last dose of apixaban was today at 09:51. Will monitor aPTT until aPTT and heparin level correlate. Earlier this month she had therapeutic heparin levels on 1350 units/hr.   Goal of Therapy:  Heparin level 0.3-0.7 units/ml aPTT 66-102 seconds Monitor platelets by anticoagulation protocol: Yes   Plan:  Begin IV heparin drip at 1350 units/hr at 22:00 Baseline aPTT and heparin level 8h heparin level Daily heparin level and CBC Monitor for s/sx  of bleeding  Thank you for involving pharmacy in this patient's care.  Loura Back, PharmD, BCPS Clinical Pharmacist Clinical phone for 01/22/2021 until 10p is [phone] 01/22/2021 5:47 PM  **Pharmacist phone directory can be found on [email or web address] listed under [facility]**

## 2021-01-23 DIAGNOSIS — I4891 Unspecified atrial fibrillation: Secondary | ICD-10-CM | POA: Diagnosis not present

## 2021-01-23 LAB — CBC
HCT: 39.4 % (ref 36.0–46.0)
Hemoglobin: 12.8 g/dL (ref 12.0–15.0)
MCH: 29 pg (ref 26.0–34.0)
MCHC: 32.5 g/dL (ref 30.0–36.0)
MCV: 89.3 fL (ref 80.0–100.0)
Platelets: 270 10*3/uL (ref 150–400)
RBC: 4.41 MIL/uL (ref 3.87–5.11)
RDW: 14.5 % (ref 11.5–15.5)
WBC: 9.4 10*3/uL (ref 4.0–10.5)
nRBC: 0 % (ref 0.0–0.2)

## 2021-01-23 LAB — BODY FLUID CULTURE W GRAM STAIN: Culture: NO GROWTH

## 2021-01-23 LAB — HEPARIN LEVEL (UNFRACTIONATED): Heparin Unfractionated: >1.1 IU/mL — ABNORMAL HIGH (ref 0.30–0.70)

## 2021-01-23 LAB — APTT
aPTT: 65 seconds — ABNORMAL HIGH (ref 24–36)
aPTT: 65 seconds — ABNORMAL HIGH (ref 24–36)

## 2021-01-23 LAB — RHEUMATOID FACTOR: Rheumatoid fact SerPl-aCnc: 57.2 IU/mL — ABNORMAL HIGH (ref <14.0)

## 2021-01-23 LAB — BRAIN NATRIURETIC PEPTIDE: B Natriuretic Peptide: 53.4 pg/mL (ref 0.0–100.0)

## 2021-01-23 MED ORDER — PREDNISONE 20 MG PO TABS
40.0000 mg | ORAL_TABLET | Freq: Every day | ORAL | Status: DC
  Administered 2021-01-23 – 2021-01-30 (×8): 40 mg via ORAL
  Filled 2021-01-23 (×8): qty 2

## 2021-01-23 NOTE — Progress Notes (Addendum)
Progress Note  Patient Name: Rebecca Short Date of Encounter: 01/23/2021  Primary Cardiologist: [name], MD    Subjective   Looks and feels much better today. Breathing at rest is improved although poor reserve   Inpatient Medications    Scheduled Meds:  colchicine  0.6 mg Oral Daily   metoprolol tartrate  12.5 mg Oral BID   polyvinyl alcohol  1 drop Both Eyes BID   sodium chloride flush  3 mL Intravenous Q12H   Continuous Infusions:  ceFEPime (MAXIPIME) IV 2 g (01/22/21 2057)   diltiazem (CARDIZEM) infusion 5 mg/hr (01/22/21 2332)   heparin 1,450 Units/hr (01/23/21 0749)   PRN Meds: acetaminophen **OR** [DISCONTINUED] acetaminophen   Vital Signs    Vitals:   01/23/21 0236 01/23/21 0336 01/23/21 0437 01/23/21 0741  BP: 116/72 115/66 107/71 115/78  Pulse: (!) 112 (!) 144 (!) 113 (!) 104  Resp: (!) 31 (!) 32 18 (!) 32  Temp:   98.2 F (36.8 C) 98 F (36.7 C)  TempSrc:   Oral Oral  SpO2: 95% 93% 94% 96%  Weight:   84.6 kg   Height:       No intake or output data in the 24 hours ending 01/23/21 0820 Filed Weights   01/21/21 0500 01/22/21 0418 01/23/21 0437  Weight: 84.1 kg 82.6 kg 84.6 kg    Physical Exam   General: Obese, NAD Neck: Negative for carotid bruits. No JVD Lungs:Clear to ausculation bilaterally. Breathing is unlabored. Cardiovascular: Irregularly irregular. No murmurs, rubs, gallops, or LV heave appreciated. Abdomen: Soft, non-tender, non-distended. No obvious abdominal masses. Extremities: No edema. Radial pulses 2+ bilaterally Neuro: Alert and oriented. No focal deficits. No facial asymmetry. MAE spontaneously. Psych: Responds to questions appropriately with normal affect.    Labs    Chemistry Recent Labs  Lab 01/19/21 1803 01/20/21 0313 01/21/21 0116  NA 134* 135 136  K 3.2* 3.2* 3.9  CL 95* 101 103  CO2 28 26 30   GLUCOSE 118* 114* 126*  BUN 13 15 18   CREATININE 0.81 0.84 0.74  CALCIUM 8.4* 8.2* 8.1*  PROT 6.0*  --    --   ALBUMIN 2.8*  --   --   AST 26  --   --   ALT 31  --   --   ALKPHOS 88  --   --   BILITOT 1.1  --   --   GFRNONAA >60 >60 >60  ANIONGAP 11 8 3*     Hematology Recent Labs  Lab 01/20/21 0313 01/21/21 0116 01/23/21 0553  WBC 14.0* 11.6* 9.4  RBC 4.38 4.19 4.41  HGB 12.8 12.1 12.8  HCT 38.9 37.2 39.4  MCV 88.8 88.8 89.3  MCH 29.2 28.9 29.0  MCHC 32.9 32.5 32.5  RDW 14.2 14.4 14.5  PLT 206 209 270    Cardiac EnzymesNo results for input(s): TROPONINI in the last 168 hours. No results for input(s): TROPIPOC in the last 168 hours.   BNP Recent Labs  Lab 01/19/21 1803  BNP 102.3*     DDimer No results for input(s): DDIMER in the last 168 hours.   Radiology    DG CHEST PORT 1 VIEW  Result Date: 01/22/2021 CLINICAL DATA:  Pleural effusion EXAM: PORTABLE CHEST 1 VIEW COMPARISON:  01/21/2021 FINDINGS: Persistent bilateral pleural effusions with adjacent atelectasis. Appearance is similar to the prior study. Similar cardiomediastinal contours. IMPRESSION: Similar bilateral pleural effusions with adjacent atelectasis. Electronically Signed   By: 01/24/2021  M.D.   On: 01/22/2021 09:28    Telemetry    01/23/21 AF with rates in the 100's  - Personally Reviewed  ECG    No new tracing as of 01/23/21 - Personally Reviewed  Cardiac Studies   Echo 01/20/21:  1. Left ventricular ejection fraction, by estimation, is 60 to 65%. The  left ventricle has normal function. The left ventricle has no regional  wall motion abnormalities.   2. A small pericardial effusion is present. There is no evidence of cardiac tamponade.   3. The inferior vena cava is normal in size with greater than 50%  respiratory variability, suggesting right atrial pressure of 3 mmHg.   Patient Profile     80 y.o. female with a hx of permanent atrial fibrillation, recent pericardial/pleural effusion with drainage who is being seen 01/20/2021 for the evaluation of atrial fibrillation with  RVR.  Assessment & Plan    1. Atrial fibrillation with RVR: -Telemetry with HRs in the 100's>>>increases to the 120's with movement    -Treated with IV diltiazem  -Felt to be secondary to pericarditis inflammation +/- HCAP -Stable echo from 01/20/21 -BPs more stable today>>.will attempt to increase Toprol to 25mg  and follow BP closely  -AC with IV heparin in case of procedure as below   2. Pericardial effusion: -s/p pericardiocentesis/thoracentesis -Echo 01/20/21 with small pericardial effusion  -CXR with bilateral pleural effusions, left improved from thoracentesis   3. Bilateral pleural effusions: -Seen by pulmonology yesterday  -Thoro cytology sent>>ANA negative, RA positive at 57.2 -Continue empiric antibiotics  -CXR with bilateral pleural effusions with adjacent atelectasis -Follow pulm recommendations>>possible repeat thoracentesis versus chest tube placement    4. Pericarditis: -Inflammatory markers positive with sed rate at 30/CRP 23.6 -Continue colchicine  -No reports of chest pain     Signed, 01/22/21 NP-C [facility] Pager: [phone] 01/23/2021, 8:20 AM     For questions or updates, please contact   Please consult [email or web address] for contact info under Cardiology/STEMI.   Personally seen and examined. Agree with APP above with the following comments: Briefly 80 Y F with AF in the setting of pleural and pericardial effusions Patient notes feeling better and is eager to start prednisone therapy Exam notable for IRIR rhythm, decreased breath sounds bilaterally Labs notable for WBC that continues to decrease; stable creatinine Personally reviewed relevant tests; Well controlled AF rates Would recommend  - will continue to increase Toprol; if hypotension continue to decrease diltiazem drip - starting prednisone therapy per pulm; if no plans for procedural intervention will return to DOAC - continue colchicine  96, MD Cardiologist [facility]  [address] [place], [address] [place], [place] [phone]  12:56 PM

## 2021-01-23 NOTE — Progress Notes (Signed)
   01/22/21 2049  Assess: MEWS Score  Temp 98.2 F (36.8 C)  BP 123/73  Pulse Rate (!) 106  ECG Heart Rate (!) 111  Resp (!) 32  Level of Consciousness Alert  SpO2 91 %  O2 Device Nasal Cannula  Patient Activity (if Appropriate) In bed  O2 Flow Rate (L/min) 3 L/min  Assess: MEWS Score  MEWS Temp 0  MEWS Systolic 0  MEWS Pulse 2  MEWS RR 2  MEWS LOC 0  MEWS Score 4  MEWS Score Color Red  Assess: if the MEWS score is Yellow or Red  Were vital signs taken at a resting state? Yes  Focused Assessment No change from prior assessment  Early Detection of Sepsis Score *See Row Information* Low  MEWS guidelines implemented *See Row Information* Yes  Treat  MEWS Interventions Escalated (See documentation below)  Pain Scale 0-10  Take Vital Signs  Increase Vital Sign Frequency  Red: Q 1hr X 4 then Q 4hr X 4, if remains red, continue Q 4hrs  Escalate  MEWS: Escalate Red: discuss with charge nurse/RN and provider, consider discussing with RRT  Notify: Charge Nurse/RN  Name of Charge Nurse/RN Notified Quentina Fronek, RN  Date Charge Nurse/RN Notified 01/22/21  Time Charge Nurse/RN Notified 2315  Notify: Provider  Provider Name/Title Dr. Minerva Areola  Date Provider Notified 01/22/21  Time Provider Notified 2320  Notification Type Page  Notification Reason Other (Comment) (MEWS is RED)  Provider response No new orders (MD called to check on patient,)  Date of Provider Response 01/22/21  Time of Provider Response 2325  Document  Patient Outcome Stabilized after interventions  Progress note created (see row info) Yes

## 2021-01-23 NOTE — Progress Notes (Signed)
ANTICOAGULATION CONSULT NOTE - Follow Up Consult  Pharmacy Consult for heparin Indication: atrial fibrillation (Eliquis on hold)  Allergies  Allergen Reactions   Azithromycin Other (See Comments)    Pain in arm with IV infusion    Condrolite [Glucosamine-Chondroitin-Msm] Rash    Patient Measurements: Height: 5\' 2"  (157.5 cm) Weight: 84.6 kg (186 lb 9.6 oz) IBW/kg (Calculated) : 50.1 Heparin Dosing Weight: 69 kg  Vital Signs: Temp: 98 F (36.7 C) (08/31 0741) Temp Source: Oral (08/31 0741) BP: 113/81 (08/31 1737) Pulse Rate: 91 (08/31 1737)  Labs: Recent Labs    01/21/21 0116 01/22/21 1824 01/23/21 0553 01/23/21 1108 01/23/21 1615  HGB 12.1  --  12.8  --   --   HCT 37.2  --  39.4  --   --   PLT 209  --  270  --   --   APTT  --  39* 65*  --  65*  HEPARINUNFRC  --  >1.10*  --  >1.10*  --   CREATININE 0.74  --   --   --   --     Estimated Creatinine Clearance: 56.6 mL/min (by C-G formula based on SCr of 0.74 mg/dL).   Medications:  Infusions:   ceFEPime (MAXIPIME) IV 2 g (01/23/21 1128)   diltiazem (CARDIZEM) infusion 5 mg/hr (01/22/21 2332)   heparin 1,450 Units/hr (01/23/21 1605)    Assessment: 80 yo F on anticoagulation PTA for hx afib.  Oral anticoagulation has been held pending possible need for procedures.  Pt continues on heparin at 1450 units/hr with aPTT slightly below goal.  No bleeding noted.  Goal of Therapy:  Heparin level 0.3-0.7 units/ml aPTT 66-102 seconds Monitor platelets by anticoagulation protocol: Yes   Plan:  Increase heparin infusion to 1600 units/hr Check heparin level and aPTT with AM labs  96, Pharm.D., BCPS Clinical Pharmacist **Pharmacist phone directory can be found on [email or web address] listed under [facility].  01/23/2021 7:02 PM

## 2021-01-23 NOTE — Progress Notes (Signed)
YETZALI WELD  WPY:[id number] DOB: 07-Apr-1941 DOA: 01/19/2021 PCP: Lawerance Cruel, MD    Brief Narrative:  80 year old with a history of permanent atrial fibrillation, obesity, and OSA who presented to the ED with severe ongoing generalized weakness and shortness of breath, suffering with blood pressures into the 80s at home.  She was admitted in July with atrial fibrillation.  She was admitted in August with pleural effusions and a pericardial effusion.  At that time 1 L of an exudative fluid was drained from her left pleural space.  She was discharged on colchicine for her pericardial effusion.  In the ED a CXR noted a similar moderate left pleural effusion and a small increase in an otherwise small right pleural effusion.  Cardiology, IR and pulmonology consulted.  S/p left diagnostic and therapeutic thoracentesis 1 L on 8/28.  Significant Events:  7/31 - 8/2 admit for afib w/ RVR and hypotension 8/11 - 8/19 admit for PNA w/ pericardial and pleural effusions  8/14 thoracentesis - 950cc drained c/w exudate  8/15 pericardiocentesis - 570cc drained  8/16 pericardial drain removed  8/27 admit via ED 8/28, thoracentesis in IR, 1 L of amber fluid drained 8/31 starting steroids  Subjective: -Feels okay overall, breathing fluctuates, a little better  Assessment & Plan:  Chronic atrial fibrillation with RVR Cardiology following.   -Remains on metoprolol and low-dose Cardizem gtt -Apixaban switched to IV heparin due to potential need for procedures/thoracentesis  Recurrent bilateral pleural effusions, left >R -On 8/14 had exudative fluid drained, around 1 L, treated for pneumonia as well then, CT without evidence of mass or other concerning findings  -On 8/28, 1 L of fluid drained again, consistent with exudate, cultures are negative -Appreciate pulmonary input, concern for inflammatory etiology in the setting of high ESR, CRP and rheumatoid factor -Additional serologies ordered, starting  prednisone today for a few weeks -Plan for repeat x-ray in 1 to 2 days -QuantiFERON gold pending but low suspicion for TB.   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   Difficult to place patient No  Consultants:  [facility] Interventional radiology Pulmonology.  Antimicrobials:  Vancomycin 8/27 > Cefepime 8/27 >   Objective:  Vitals:   01/23/21 0437 01/23/21 0741 01/23/21 1037 01/23/21 1137  BP: 107/71 115/78 100/70 114/81  Pulse: (!) 113 (!) 104 (!) 109 (!) 102  Resp: 18 (!) 32 (!) 22 (!) 36  Temp: 98.2 F (36.8 C) 98 F (36.7 C)    TempSrc: Oral Oral    SpO2: 94% 96% 93% 93%  Weight: 84.6 kg     Height:  Examination: General exam: Pleasant elderly female sitting up in bed, AAOx3, no distress HEENT: No JVD CVS: S1-S2, irregularly irregular Lungs: Decreased breath sounds to bases Abdomen: Soft, nontender, bowel sounds present Extremities: No edema Skin: No rash on exposed skin    CBC: Recent Labs  Lab 01/19/21 1803 01/20/21 0313 01/21/21 0116 01/23/21 0553  WBC 15.3* 14.0* 11.6* 9.4   NEUTROABS 13.0*  --   --   --   HGB 14.8 12.8 12.1 12.8  HCT 45.2 38.9 37.2 39.4  MCV 89.3 88.8 88.8 89.3  PLT 241 206 209 161   Basic Metabolic Panel: Recent Labs  Lab 01/19/21 1803 01/20/21 0313 01/21/21 0116  NA 134* 135 136  K 3.2* 3.2* 3.9  CL 95* 101 103  CO2 $Re'28 26 30  'gDE$ GLUCOSE 118* 114* 126*  BUN $Re'13 15 18  'qAT$ CREATININE 0.81 0.84 0.74  CALCIUM 8.4* 8.2* 8.1*  MG  --  1.9 1.9   GFR: Estimated Creatinine Clearance: 56.6 mL/min (by C-G formula based on SCr of 0.74 mg/dL).  Liver Function Tests: Recent Labs  Lab 01/19/21 1803  AST 26  ALT 31  ALKPHOS 88  BILITOT 1.1  PROT 6.0*  ALBUMIN 2.8*    HbA1C: Hgb A1c MFr Bld  Date/Time Value Ref Range Status  12/23/2020 10:12 PM 5.4 4.8 - 5.6 % Final    Comment:    (NOTE) Pre diabetes:          5.7%-6.4%  Diabetes:              >6.4%  Glycemic control for   <7.0% adults with diabetes     CBG: Recent Labs  Lab 01/19/21 1712  GLUCAP 122*    Recent Results (from the past 240 hour(s))  Blood culture (routine x 2)     Status: None (Preliminary result)   Collection Time: 01/19/21  5:17 PM   Specimen: BLOOD  Result Value Ref Range Status   Specimen Description BLOOD IV START  Final   Special Requests   Final    BOTTLES DRAWN AEROBIC AND ANAEROBIC Blood Culture results may not be optimal due to an inadequate volume of blood received in culture bottles   Culture   Final    NO GROWTH 4 DAYS Performed at [facility], [place] [address], [place], [place] [address]    Report Status PENDING  Incomplete  Resp Panel by RT-PCR (Flu A&B, Covid) Nasopharyngeal Swab     Status: None   Collection Time: 01/19/21  6:03 PM   Specimen: Nasopharyngeal Swab; Nasopharyngeal(NP) swabs in vial transport medium  Result Value Ref Range Status   SARS Coronavirus 2 by RT PCR NEGATIVE NEGATIVE Final    Comment: (NOTE) SARS-CoV-2 target nucleic acids are NOT DETECTED.  The SARS-CoV-2 RNA is generally detectable in upper  respiratory specimens during the acute phase of infection. The lowest concentration of SARS-CoV-2 viral copies this assay can detect is 138 copies/mL. A negative result does not preclude SARS-Cov-2 infection and should not be used as the sole basis for treatment or other patient management decisions. A negative result may occur with  improper specimen collection/handling, submission of specimen other than nasopharyngeal swab, presence of viral mutation(s) within the areas targeted by this assay, and inadequate number of viral copies(<138 copies/mL). A negative result must be combined with clinical observations, patient history, and epidemiological information. The expected result is Negative.  Fact Sheet for Patients:  EntrepreneurPulse.com.au  Fact Sheet for Healthcare Providers:  IncredibleEmployment.be  This  test is no t yet approved or cleared by the [place] and  has been authorized for detection and/or diagnosis of SARS-CoV-2 by FDA under an Emergency Use Authorization (EUA). This EUA will remain  in effect (meaning this test can be used) for the duration of the COVID-19 declaration under Section 564(b)(1) of the Act, 21 U.S.C.section 360bbb-3(b)(1), unless the authorization is terminated  or revoked sooner.       Influenza A by PCR NEGATIVE NEGATIVE Final   Influenza B by PCR NEGATIVE NEGATIVE Final    Comment: (NOTE) The Xpert Xpress SARS-CoV-2/FLU/RSV plus assay is intended as an aid in the diagnosis of influenza from Nasopharyngeal swab specimens and should not be used as a sole basis for treatment. Nasal washings and aspirates are unacceptable for Xpert Xpress SARS-CoV-2/FLU/RSV testing.  Fact Sheet for Patients: EntrepreneurPulse.com.au  Fact Sheet for Healthcare Providers: IncredibleEmployment.be  This test is not yet approved or cleared by the [place] FDA and has been  authorized for detection and/or diagnosis of SARS-CoV-2 by FDA under an Emergency Use Authorization (EUA). This EUA will remain in effect (meaning this test can be used) for the duration of the COVID-19 declaration under Section 564(b)(1) of the Act, 21 U.S.C. section 360bbb-3(b)(1), unless the authorization is terminated or revoked.  Performed at [facility], [place] [address] [place], [place] [address]   Body fluid culture w Gram Stain     Status: None   Collection Time: 01/20/21 11:36 AM   Specimen: PATH Cytology Pleural fluid  Result Value Ref Range Status   Specimen Description PLEURAL FLUID  Final   Special Requests CYTO  Final   Gram Stain   Final    FEW WBC PRESENT,BOTH PMN AND MONONUCLEAR NO ORGANISMS SEEN    Culture   Final    NO GROWTH 3 DAYS Performed at [facility], [place] [address] [place], [place] [address]    Report Status 01/23/2021 FINAL  Final     Scheduled Meds:  colchicine  0.6 mg Oral Daily   metoprolol tartrate  12.5 mg Oral BID   polyvinyl alcohol  1 drop Both Eyes BID   predniSONE  40 mg Oral Q breakfast   sodium chloride flush  3 mL Intravenous Q12H   Continuous Infusions:  ceFEPime (MAXIPIME) IV 2 g (01/23/21 1128)   diltiazem (CARDIZEM) infusion 5 mg/hr (01/22/21 2332)   heparin 1,450 Units/hr (01/23/21 0749)     LOS: 3 days   Domenic Polite, MD,

## 2021-01-23 NOTE — Progress Notes (Signed)
NAME:  Rebecca Short, MRN:  [id number], DOB:  07/15/1940, LOS: 3 ADMISSION DATE:  01/19/2021, CONSULTATION DATE:  01/21/21 REFERRING MD:  Dr. Algis Liming, CHIEF COMPLAINT:  Recurrent dsypnea    History of Present Illness:  Rebecca Short is a 80 y.o. female with a PMH significant for permanent A-fib anticoagulated with Eliquis, obesity,  OSA on CPAP, and macular degeneration who presented to the ED with complaints of generalized weakness and shortness of breath. Of note patient was seen and treated at this facility 7/31 - 8/2 for A-fib RVR and hypotension. Subsequently admitted again 8/11-8/19 for PNA with pericardial and pleura effusions. She underwent thoracentesis 8/14 with 929ml of exudative fluid removed. On 8/15 she underwent pericardiocentesis with 515ml of fluid drained.   On ED arrival for this admission 8/27 patient was seen with tachypnea and mild tachycardia with all other vital stable. CXR was obtained and revealed moderate left pleural effusion and small right pleural effusion. IR was consulted and patient underwent left thoracentesis with 1L of amber colored fluid removed. Fluid continues to remain exudative by Lights Criteria. PCCM consulted for further assistance in management of pleural effusion. Cardiology consulted for management of pericardial effusion   Pertinent  Medical History  Permanent A-fib, obesity,  OSA on CPAP, and macular degeneration  Significant recent Events:  7/31 - 8/2 admit for afib w/ RVR and hypotension 8/11 - 8/19 admit for PNA w/ pericardial and pleural effusions 8/11 CT chest with moderate left and small right pleural effusions. Adjacent bibasilar and lingular atelectasis. Increased, moderate-sized pericardial effusion. Biatrial enlargement. Mild pulmonary edema. No masses  8/14 thoracentesis - 950cc drained c/w exudate  8/15 pericardiocentesis - 570cc drained  8/16 pericardial drain removed  8/27 admit via ED repeat thoracentesis per IR with 1L of amber  exudative fluid removed   Interim History / Subjective:   Continues to have dyspnea and increased tachycardia with movement.   Objective   Blood pressure 115/78, pulse (!) 104, temperature 98 F (36.7 C), temperature source Oral, resp. rate (!) 32, height $RemoveBe'5\' 2"'ybyLipfTl$  (1.575 m), weight 84.6 kg, SpO2 96 %.       No intake or output data in the 24 hours ending 01/23/21 1059  Filed Weights   01/21/21 0500 01/22/21 0418 01/23/21 0437  Weight: 84.1 kg 82.6 kg 84.6 kg    Examination: General: Elderly woman comfortable laying in bed HEENT: [place]/AT, sclera anicteric, moist mucous membranes Neuro: Awake, alert, moves all extremities, nonfocal CV: tachycardic, Irregularly irregular PULM: Decreased bilateral breath sounds, no wheezing. GI: Nondistended, nontender, positive bowel sounds Extremities: No edema Skin: No rash  Resolved Hospital Problem list     Assessment & Plan:  Recurrent bilateral, left greater than right, pleural effusions and Pericardial Effusion -8/11 CT chest with moderate left and small right pleural effusions. Adjacent bibasilar and lingular atelectasis. Increased, moderate-sized pericardial effusion. Biatrial enlargement with mild pulmonary edema. No masses  -Underwent left thoracentesis during previous admission 8/14 with  950cc drained c/w exudate. Cytology negative for malignant cells with reactive mesothelial cells present  -Repeat left thoracentesis by IR 8/28 with 1L of amber exudative fluid removed, with neutrophil predominance. Cytology is negative for malignancy. -Etiology is concerning for inflammatory etiology with elevated ESR, CRP and rheumatoid factor with neutrophil predominance on pleural fluid. ANA is negative. 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  Atrial fibrillation  P: -Check SSA/SSB, CCP and anti-histone antibodies. Check BNP. F/u quantiferon gold. - Will  start prednisone $RemoveBeforeDE'40mg'YbgeLkFTSsUZNCV$  daily today, and will  likely continue this for 2-4 weeks, and then plan steroid taper from there.  - We will monitor for any response to steroids over the next couple of days and consider repeat thoracentesis for therapeutic purposes. Continue heparin for now until we determine plans for further procedures. - Can complete 5 day course of cefepime.    Labs   CBC: Recent Labs  Lab 01/19/21 1803 01/20/21 0313 01/21/21 0116 01/23/21 0553  WBC 15.3* 14.0* 11.6* 9.4  NEUTROABS 13.0*  --   --   --   HGB 14.8 12.8 12.1 12.8  HCT 45.2 38.9 37.2 39.4  MCV 89.3 88.8 88.8 89.3  PLT 241 206 209 574    Basic Metabolic Panel: Recent Labs  Lab 01/19/21 1803 01/20/21 0313 01/21/21 0116  NA 134* 135 136  K 3.2* 3.2* 3.9  CL 95* 101 103  CO2 $Re'28 26 30  'LNf$ GLUCOSE 118* 114* 126*  BUN $Re'13 15 18  'NaJ$ CREATININE 0.81 0.84 0.74  CALCIUM 8.4* 8.2* 8.1*  MG  --  1.9 1.9   GFR: Estimated Creatinine Clearance: 56.6 mL/min (by C-G formula based on SCr of 0.74 mg/dL). Recent Labs  Lab 01/19/21 1803 01/20/21 0313 01/21/21 0116 01/23/21 0553  PROCALCITON  --  <0.10  --   --   WBC 15.3* 14.0* 11.6* 9.4  LATICACIDVEN 1.6  --   --   --     Liver Function Tests: Recent Labs  Lab 01/19/21 1803  AST 26  ALT 31  ALKPHOS 88  BILITOT 1.1  PROT 6.0*  ALBUMIN 2.8*   HbA1C: Hgb A1c MFr Bld  Date/Time Value Ref Range Status  12/23/2020 10:12 PM 5.4 4.8 - 5.6 % Final    Comment:    (NOTE) Pre diabetes:          5.7%-6.4%  Diabetes:              >6.4%  Glycemic control for   <7.0% adults with diabetes     CBG: Recent Labs  Lab 01/19/21 1712  GLUCAP 122*     Critical care time: N/A    Freda Jackson, MD [facility] Office: [phone]   See Amion for personal pager PCCM on call pager [phone] until 7pm. Please call Elink 7p-7a. [phone]

## 2021-01-23 NOTE — Progress Notes (Signed)
ANTICOAGULATION CONSULT NOTE Pharmacy Consult for heparin Indication: atrial fibrillation  Allergies  Allergen Reactions   Azithromycin Other (See Comments)    Pain in arm with IV infusion    Condrolite [Glucosamine-Chondroitin-Msm] Rash    Patient Measurements: Height: 5\' 2"  (157.5 cm) Weight: 84.6 kg (186 lb 9.6 oz) IBW/kg (Calculated) : 50.1 Heparin Dosing Weight: 69 kg  Vital Signs: Temp: 98.2 F (36.8 C) (08/31 0437) Temp Source: Oral (08/31 0437) BP: 107/71 (08/31 0437) Pulse Rate: 113 (08/31 0437)  Labs: Recent Labs    01/21/21 0116 01/22/21 1824 01/23/21 0553  HGB 12.1  --  12.8  HCT 37.2  --  39.4  PLT 209  --  270  APTT  --  39* 65*  HEPARINUNFRC  --  >1.10*  --   CREATININE 0.74  --   --      Estimated Creatinine Clearance: 56.6 mL/min (by C-G formula based on SCr of 0.74 mg/dL).  Assessment: 80 y.o. female with h/o Afib, Eliquis on hold, for heparin  Goal of Therapy:  Heparin level 0.3-0.7 units/ml aPTT 66-102 seconds Monitor platelets by anticoagulation protocol: Yes   Plan:  Increase Heparin 1450 units/hr  96, PharmD, BCPS

## 2021-01-24 ENCOUNTER — Encounter (HOSPITAL_COMMUNITY): Payer: Self-pay | Admitting: Internal Medicine

## 2021-01-24 ENCOUNTER — Inpatient Hospital Stay (HOSPITAL_COMMUNITY): Payer: Medicare Other

## 2021-01-24 DIAGNOSIS — I4891 Unspecified atrial fibrillation: Secondary | ICD-10-CM | POA: Diagnosis not present

## 2021-01-24 LAB — SJOGRENS SYNDROME-A EXTRACTABLE NUCLEAR ANTIBODY: SSA (Ro) (ENA) Antibody, IgG: <0.2 AI (ref 0.0–0.9)

## 2021-01-24 LAB — CBC
HCT: 37.9 % (ref 36.0–46.0)
Hemoglobin: 12.3 g/dL (ref 12.0–15.0)
MCH: 29.1 pg (ref 26.0–34.0)
MCHC: 32.5 g/dL (ref 30.0–36.0)
MCV: 89.8 fL (ref 80.0–100.0)
Platelets: 292 10*3/uL (ref 150–400)
RBC: 4.22 MIL/uL (ref 3.87–5.11)
RDW: 14.4 % (ref 11.5–15.5)
WBC: 6.1 10*3/uL (ref 4.0–10.5)
nRBC: 0 % (ref 0.0–0.2)

## 2021-01-24 LAB — HISTONE ANTIBODIES, IGG, BLOOD: DNA-Histone: 0.7 Units (ref 0.0–0.9)

## 2021-01-24 LAB — HEPARIN LEVEL (UNFRACTIONATED): Heparin Unfractionated: 0.73 IU/mL — ABNORMAL HIGH (ref 0.30–0.70)

## 2021-01-24 LAB — BASIC METABOLIC PANEL
Anion gap: 7 (ref 5–15)
BUN: 17 mg/dL (ref 8–23)
CO2: 25 mmol/L (ref 22–32)
Calcium: 8.3 mg/dL — ABNORMAL LOW (ref 8.9–10.3)
Chloride: 106 mmol/L (ref 98–111)
Creatinine, Ser: 0.59 mg/dL (ref 0.44–1.00)
GFR, Estimated: >60 mL/min (ref >60)
Glucose, Bld: 168 mg/dL — ABNORMAL HIGH (ref 70–99)
Potassium: 4.2 mmol/L (ref 3.5–5.1)
Sodium: 138 mmol/L (ref 135–145)

## 2021-01-24 LAB — CULTURE, BLOOD (ROUTINE X 2): Culture: NO GROWTH

## 2021-01-24 LAB — APTT
aPTT: 114 seconds — ABNORMAL HIGH (ref 24–36)
aPTT: 59 seconds — ABNORMAL HIGH (ref 24–36)

## 2021-01-24 LAB — SJOGRENS SYNDROME-B EXTRACTABLE NUCLEAR ANTIBODY: SSB (La) (ENA) Antibody, IgG: <0.2 AI (ref 0.0–0.9)

## 2021-01-24 NOTE — Progress Notes (Signed)
Physical Therapy Evaluation Patient Details Name: Rebecca Short MRN: [id number] DOB: 06/06/1940 Today's Date: 01/24/2021   History of Present Illness  Pt is an 80yo female presenting to [facility] ED on 8/27 with increasing weakness and SOB. Recently discharged after bilateral pleural/pericardial effusions and returned  secondary to continued symptoms.  Thoracocentsis performed 8/28 with 1L drained. PMH: afib with RVR, OSA, leukocytosis, macular degeneration.  Clinical Impression  Pt presents with the impairments above and problems listed below. Pt required min guard to supervision for bed mobility and min guard for transfers using RW. Further mobility limited secondary to fatigue. Pt's husband and neighbor very helpful and involved in care and currently providing 24/7 assist. Pt reports receiving HHPT prior to admission. Recommending resuming at discharge. Pt is motivated to improve and feel she will progress well. We will continue to follow her acutely to promote independence with functional mobility.    Follow Up Recommendations Home health PT;Supervision/Assistance - 24 hour (Continue HHPT at d/c.)    Equipment Recommendations  None recommended by PT (Pt has required DME.)    Recommendations for Other Services       Precautions / Restrictions Precautions Precautions: Fall Precaution Comments: Monitor HR and O2 Restrictions Weight Bearing Restrictions: No      Mobility  Bed Mobility Overal bed mobility: Needs Assistance Bed Mobility: Supine to Sit     Supine to sit: Supervision;Min guard     General bed mobility comments: Pt supervision to min guard for safety and line managment only.    Transfers Overall transfer level: Needs assistance Equipment used: Rolling walker (2 wheeled) Transfers: Sit to/from Stand Sit to Stand: Min guard Stand pivot transfers: Min guard       General transfer comment: Pt min guard for safety only. Required 2 attempts to acheive standing and upon  standing developed mild cough that took approximately 30s to resolve. Standing posture was very flexed. Mobility limited to chair secondary to increased fatigue  Ambulation/Gait                Stairs            Wheelchair Mobility    Modified Rankin (Stroke Patients Only)       Balance Overall balance assessment: Needs assistance Sitting-balance support: No upper extremity supported;Feet supported Sitting balance-Leahy Scale: Fair     Standing balance support: Bilateral upper extremity supported;During functional activity Standing balance-Leahy Scale: Poor Standing balance comment: Pt reliant on BUE on RW                             Pertinent Vitals/Pain Pain Assessment: No/denies pain    Home Living Family/patient expects to be discharged to:: Private residence Living Arrangements: Spouse/significant other (Husband Fred) Available Help at Discharge: Family;Available 24 hours/day;Neighbor Type of Home: House Home Access: Stairs to enter Entrance Stairs-Rails: Doctor, general practice of Steps: 3 Home Layout: One level Home Equipment: Walker - 2 wheels;Cane - single point;Bedside commode;Adaptive equipment;Grab bars - tub/shower;Grab bars - toilet Additional Comments: Lilly Cove is a retired Engineer, civil (consulting) and is very helpful with assisting    Prior Function Level of Independence: Needs assistance   Gait / Transfers Assistance Needed: Uses RW for mobility tasks  ADL's / Homemaking Assistance Needed: Husband or neighbor helps with ADLs as needed, including bathing, cooking.        Hand Dominance   Dominant Hand: Right    Extremity/Trunk Assessment   Upper Extremity Assessment Upper  Extremity Assessment: Generalized weakness    Lower Extremity Assessment Lower Extremity Assessment: Generalized weakness    Cervical / Trunk Assessment Cervical / Trunk Assessment: Normal  Communication   Communication: No difficulties  Cognition  Arousal/Alertness: Awake/alert Behavior During Therapy: WFL for tasks assessed/performed Overall Cognitive Status: Within Functional Limits for tasks assessed                                        General Comments General comments (skin integrity, edema, etc.): BP and O2 monitored WFL throughout session.    Exercises     Assessment/Plan    PT Assessment Patient needs continued PT services  PT Problem List Decreased strength;Decreased mobility;Decreased activity tolerance;Decreased balance       PT Treatment Interventions DME instruction;Gait training;Therapeutic activities;Balance training;Patient/family education;Stair training;Functional mobility training;Therapeutic exercise    PT Goals (Current goals can be found in the Care Plan section)  Acute Rehab PT Goals Patient Stated Goal: To be able to walk in the park again. PT Goal Formulation: With patient/family Time For Goal Achievement: 02/07/21 Potential to Achieve Goals: Good    Frequency Min 3X/week   Barriers to discharge        Co-evaluation               AM-PAC PT "6 Clicks" Mobility  Outcome Measure Help needed turning from your back to your side while in a flat bed without using bedrails?: A Little Help needed moving from lying on your back to sitting on the side of a flat bed without using bedrails?: A Little Help needed moving to and from a bed to a chair (including a wheelchair)?: A Little Help needed standing up from a chair using your arms (e.g., wheelchair or bedside chair)?: A Little Help needed to walk in hospital room?: A Little Help needed climbing 3-5 steps with a railing? : A Lot 6 Click Score: 17    End of Session Equipment Utilized During Treatment: Gait belt Activity Tolerance: Patient limited by fatigue Patient left: in chair;with call bell/phone within reach;with family/visitor present (Husband and neighbor) Nurse Communication: Mobility status PT Visit Diagnosis:  Unsteadiness on feet (R26.81);History of falling (Z91.81);Muscle weakness (generalized) (M62.81);Other abnormalities of gait and mobility (R26.89)    Time: 4142-3953 PT Time Calculation (min) (ACUTE ONLY): 23 min   Charges:   PT Evaluation $PT Eval Moderate Complexity: 1 Mod PT Treatments $Therapeutic Activity: 8-22 mins        Johnn Hai, SPT Johnn Hai 01/24/2021, 12:15 PM

## 2021-01-24 NOTE — Care Management Important Message (Signed)
Important Message  Patient Details  Name: Rebecca Short MRN: [id number] Date of Birth: 1940-08-03   Medicare Important Message Given:  Yes     [name] 01/24/2021, 9:30 AM

## 2021-01-24 NOTE — Progress Notes (Signed)
Rebecca Short  JVH:[id number] DOB: February 16, 1941 DOA: 01/19/2021 PCP: Daisy Floro, MD    Brief Narrative:  80 year old with a history of permanent atrial fibrillation, obesity, and OSA who presented to the ED with severe ongoing generalized weakness and shortness of breath, suffering with blood pressures into the 80s at home.  She was admitted in July with atrial fibrillation.  She was admitted in August with pleural effusions and a pericardial effusion.  At that time 1 L of an exudative fluid was drained from her left pleural space.  She was discharged on colchicine for her pericardial effusion.  In the ED a CXR noted a similar moderate left pleural effusion and a small increase in an otherwise small right pleural effusion.  Cardiology, IR and pulmonology consulted.  S/p left diagnostic and therapeutic thoracentesis 1 L on 8/28.  Significant Events:  7/31 - 8/2 admit for afib w/ RVR and hypotension 8/11 - 8/19 admit for PNA w/ pericardial and pleural effusions  8/14 thoracentesis - 950cc drained c/w exudate  8/15 pericardiocentesis - 570cc drained  8/16 pericardial drain removed  8/27 admit via ED 8/28, thoracentesis in IR, 1 L of amber fluid drained 8/31 starting steroids  Subjective: -Feels better today, overall breathing appears to be improving  Assessment & Plan:  Chronic atrial fibrillation with RVR Cardiology following.   -Remains on metoprolol and low-dose Cardizem gtt -Apixaban switched to IV heparin due to need for thoracentesis  Recurrent bilateral pleural effusions, left >R -On 8/14 had exudative fluid drained, around 1 L, treated for pneumonia as well then, CT without evidence of mass or other concerning findings  -On 8/28, 1 L of fluid drained again, consistent with exudate, cultures are negative -Appreciate pulmonary input, concern for inflammatory etiology in the setting of high ESR, CRP and rheumatoid factor -Additional serologies ordered, started prednisone 40 mg  daily on 8/31 -Repeat chest x-ray today noted moderate to large bilateral effusions -Pulmonary following, plan for thoracentesis tomorrow afternoon, hold heparin in a.m. -QuantiFERON gold pending but low suspicion for TB.   -ontinue cefepime to complete 5-day course   Pericardial effusion/pericarditis -Underwent pericardiocentesis on 8/15 for large pericardial effusion, started on colchicine then  -Repeat echo with small pericardial effusion  -Continue colchicine, starting steroids, see discussion above   Tachycardia induced cardiomyopathy Suffered recent acute CHF episode felt to be due to tachycardia -was previously diagnosed and is usually on Lasix 40 mg daily  OSA on CPAP Continue usual CPAP dosing   DVT prophylaxis: IV heparin CODE STATUS: Full code Family Communication: Spouse at bedside  status is: Inpatient  Remains inpatient appropriate because:Inpatient level of care appropriate due to severity of illness  Dispo: The patient is from: Home              Anticipated d/c is to: Home next week              Patient currently is not medically stable to d/c.   Difficult to place patient No  Consultants:  [facility] Cardiology Interventional radiology Pulmonology.  Antimicrobials:  Vancomycin 8/27 > Cefepime 8/27 >   Objective:  Vitals:   01/24/21 0500 01/24/21 0737 01/24/21 1126 01/24/21 1127  BP:  (!) 125/59 127/78 127/78  Pulse:  97 84 84  Resp:  20 (!) 25 (!) 25  Temp:  97.8 F (36.6 C) 97.8 F (36.6 C) 97.8 F (36.6 C)  TempSrc:  Oral Oral   SpO2:  93% 91%   Weight: 83.1 kg  Height:          Examination: General exam:  Pleasant elderly female sitting up in bed, AAOx3, no distress HEENT: Positive JVD CVS: S1-S2, irregularly irregular Lungs: Decreased breath sounds both lungs, midway down Abdomen: Soft, nontender, bowel sounds present Extremities: No edema  Skin: No rash on exposed skin    CBC: Recent Labs  Lab 01/19/21 1803 01/20/21 0313  01/21/21 0116 01/23/21 0553 01/24/21 0251  WBC 15.3*   < > 11.6* 9.4 6.1  NEUTROABS 13.0*  --   --   --   --   HGB 14.8   < > 12.1 12.8 12.3  HCT 45.2   < > 37.2 39.4 37.9  MCV 89.3   < > 88.8 89.3 89.8  PLT 241   < > 209 270 292   < > = values in this interval not displayed.   Basic Metabolic Panel: Recent Labs  Lab 01/20/21 0313 01/21/21 0116 01/24/21 0251  NA 135 136 138  K 3.2* 3.9 4.2  CL 101 103 106  CO2 $Re'26 30 25  'ssE$ GLUCOSE 114* 126* 168*  BUN $Re'15 18 17  'Irs$ CREATININE 0.84 0.74 0.59  CALCIUM 8.2* 8.1* 8.3*  MG 1.9 1.9  --    GFR: Estimated Creatinine Clearance: 56 mL/min (by C-G formula based on SCr of 0.59 mg/dL).  Liver Function Tests: Recent Labs  Lab 01/19/21 1803  AST 26  ALT 31  ALKPHOS 88  BILITOT 1.1  PROT 6.0*  ALBUMIN 2.8*    HbA1C: Hgb A1c MFr Bld  Date/Time Value Ref Range Status  12/23/2020 10:12 PM 5.4 4.8 - 5.6 % Final    Comment:    (NOTE) Pre diabetes:          5.7%-6.4%  Diabetes:              >6.4%  Glycemic control for   <7.0% adults with diabetes     CBG: Recent Labs  Lab 01/19/21 1712  GLUCAP 122*    Recent Results (from the past 240 hour(s))  Blood culture (routine x 2)     Status: None   Collection Time: 01/19/21  5:17 PM   Specimen: BLOOD  Result Value Ref Range Status   Specimen Description BLOOD IV START  Final   Special Requests   Final    BOTTLES DRAWN AEROBIC AND ANAEROBIC Blood Culture results may not be optimal due to an inadequate volume of blood received in culture bottles   Culture   Final    NO GROWTH 5 DAYS Performed at [facility], [place] [address] [place], [place] [address]    Report Status 01/24/2021 FINAL  Final  Resp Panel by RT-PCR (Flu A&B, Covid) Nasopharyngeal Swab     Status: None   Collection Time: 01/19/21  6:03 PM   Specimen: Nasopharyngeal Swab; Nasopharyngeal(NP) swabs in vial transport medium  Result Value Ref Range Status   SARS Coronavirus 2 by RT PCR NEGATIVE NEGATIVE Final     Comment: (NOTE) SARS-CoV-2 target nucleic acids are NOT DETECTED.  The SARS-CoV-2 RNA is generally detectable in upper respiratory specimens during the acute phase of infection. The lowest concentration of SARS-CoV-2 viral copies this assay can detect is 138 copies/mL. A negative result does not preclude SARS-Cov-2 infection and should not be used as the sole basis for treatment or other patient management decisions. A negative result may occur with  improper specimen collection/handling, submission of specimen other than nasopharyngeal swab, presence of viral mutation(s) within  the areas targeted by this assay, and inadequate number of viral copies(<138 copies/mL). A negative result must be combined with clinical observations, patient history, and epidemiological information. The expected result is Negative.  Fact Sheet for Patients:  EntrepreneurPulse.com.au  Fact Sheet for Healthcare Providers:  IncredibleEmployment.be  This test is no t yet approved or cleared by the [place] FDA and  has been authorized for detection and/or diagnosis of SARS-CoV-2 by FDA under an Emergency Use Authorization (EUA). This EUA will remain  in effect (meaning this test can be used) for the duration of the COVID-19 declaration under Section 564(b)(1) of the Act, 21 U.S.C.section 360bbb-3(b)(1), unless the authorization is terminated  or revoked sooner.       Influenza A by PCR NEGATIVE NEGATIVE Final   Influenza B by PCR NEGATIVE NEGATIVE Final    Comment: (NOTE) The Xpert Xpress SARS-CoV-2/FLU/RSV plus assay is intended as an aid in the diagnosis of influenza from Nasopharyngeal swab specimens and should not be used as a sole basis for treatment. Nasal washings and aspirates are unacceptable for Xpert Xpress SARS-CoV-2/FLU/RSV testing.  Fact Sheet for Patients: EntrepreneurPulse.com.au  Fact Sheet for Healthcare  Providers: IncredibleEmployment.be  This test is not yet approved or cleared by the [place] FDA and has been authorized for detection and/or diagnosis of SARS-CoV-2 by FDA under an Emergency Use Authorization (EUA). This EUA will remain in effect (meaning this test can be used) for the duration of the COVID-19 declaration under Section 564(b)(1) of the Act, 21 U.S.C. section 360bbb-3(b)(1), unless the authorization is terminated or revoked.  Performed at [facility], [place] [address], [place], [place] [address]   Body fluid culture w Gram Stain     Status: None   Collection Time: 01/20/21 11:36 AM   Specimen: PATH Cytology Pleural fluid  Result Value Ref Range Status   Specimen Description PLEURAL FLUID  Final   Special Requests CYTO  Final   Gram Stain   Final    FEW WBC PRESENT,BOTH PMN AND MONONUCLEAR NO ORGANISMS SEEN    Culture   Final    NO GROWTH 3 DAYS Performed at [facility], [place] [address] [place], [place] [address]    Report Status 01/23/2021 FINAL  Final     Scheduled Meds:  colchicine  0.6 mg Oral Daily   metoprolol tartrate  12.5 mg Oral BID   polyvinyl alcohol  1 drop Both Eyes BID   predniSONE  40 mg Oral Q breakfast   sodium chloride flush  3 mL Intravenous Q12H   Continuous Infusions:  ceFEPime (MAXIPIME) IV Stopped (01/24/21 1029)   diltiazem (CARDIZEM) infusion 5 mg/hr (01/24/21 1100)   heparin 1,500 Units/hr (01/24/21 1326)     LOS: 4 days   Domenic Polite, MD,

## 2021-01-24 NOTE — Progress Notes (Signed)
ANTICOAGULATION CONSULT NOTE - Follow Up Consult  Pharmacy Consult for heparin Indication: atrial fibrillation (Eliquis on hold)  Allergies  Allergen Reactions   Azithromycin Other (See Comments)    Pain in arm with IV infusion    Condrolite [Glucosamine-Chondroitin-Msm] Rash    Patient Measurements: Height: 5\' 2"  (157.5 cm) Weight: 83.1 kg (183 lb 3.2 oz) IBW/kg (Calculated) : 50.1 Heparin Dosing Weight: 69 kg  Vital Signs: Temp: 98.1 F (36.7 C) (09/01 1631) Temp Source: Oral (09/01 1631) BP: 119/81 (09/01 1631) Pulse Rate: 106 (09/01 1631)  Labs: Recent Labs    01/22/21 1824 01/23/21 0553 01/23/21 1108 01/23/21 1615 01/24/21 0251 01/24/21 1546  HGB  --  12.8  --   --  12.3  --   HCT  --  39.4  --   --  37.9  --   PLT  --  270  --   --  292  --   APTT 39* 65*  --  65* 114* 59*  HEPARINUNFRC >1.10*  --  >1.10*  --  0.73*  --   CREATININE  --   --   --   --  0.59  --      Estimated Creatinine Clearance: 56 mL/min (by C-G formula based on SCr of 0.59 mg/dL).   Medications:  Infusions:   ceFEPime (MAXIPIME) IV Stopped (01/24/21 1029)   diltiazem (CARDIZEM) infusion 5 mg/hr (01/24/21 1700)   heparin 1,500 Units/hr (01/24/21 1700)    Assessment: 80 yo F on anticoagulation PTA for hx afib.  Oral anticoagulation has been held pending possible need for procedures.  Heparin now below goal on heparin at 1500 units/hr.    Noted stop time of 9/2 at 0600 in anticipation of thoracentesis.  Goal of Therapy:  Heparin level 0.3-0.7 units/ml aPTT 66-102 seconds Monitor platelets by anticoagulation protocol: Yes   Plan:  Increase heparin infusion to 1550 units/hr Check heparin level and aPTT with AM labs  11/2, Pharm.D., BCPS Clinical Pharmacist **Pharmacist phone directory can be found on [email or web address] listed under [facility].  01/24/2021 6:44 PM

## 2021-01-24 NOTE — Progress Notes (Signed)
 NAME:  Rebecca Short, MRN:  [id number], DOB:  12/22/1940, LOS: 4 ADMISSION DATE:  01/19/2021, CONSULTATION DATE:  01/21/21 REFERRING MD:  Dr. Hongalgi, CHIEF COMPLAINT:  Recurrent dsypnea    History of Present Illness:  Rebecca Short is a 80 y.o. female with a PMH significant for permanent A-fib anticoagulated with Eliquis, obesity,  OSA on CPAP, and macular degeneration who presented to the ED with complaints of generalized weakness and shortness of breath. Of note patient was seen and treated at this facility 7/31 - 8/2 for A-fib RVR and hypotension. Subsequently admitted again 8/11-8/19 for PNA with pericardial and pleura effusions. She underwent thoracentesis 8/14 with 950ml of exudative fluid removed. On 8/15 she underwent pericardiocentesis with 570ml of fluid drained.   On ED arrival for this admission 8/27 patient was seen with tachypnea and mild tachycardia with all other vital stable. CXR was obtained and revealed moderate left pleural effusion and small right pleural effusion. IR was consulted and patient underwent left thoracentesis with 1L of amber colored fluid removed. Fluid continues to remain exudative by Lights Criteria. PCCM consulted for further assistance in management of pleural effusion. Cardiology consulted for management of pericardial effusion   Pertinent  Medical History  Permanent A-fib, obesity,  OSA on CPAP, and macular degeneration  Significant recent Events:  7/31 - 8/2 admit for afib w/ RVR and hypotension 8/11 - 8/19 admit for PNA w/ pericardial and pleural effusions 8/11 CT chest with moderate left and small right pleural effusions. Adjacent bibasilar and lingular atelectasis. Increased, moderate-sized pericardial effusion. Biatrial enlargement. Mild pulmonary edema. No masses  8/14 thoracentesis - 950cc drained c/w exudate  8/15 pericardiocentesis - 570cc drained  8/16 pericardial drain removed  8/27 admit via ED repeat thoracentesis per IR with 1L of amber  exudative fluid removed   Interim History / Subjective:   Feeling better today but remains short of breath. On room air.  Chest radiograph with worsening bilateral effusions.  Objective   Blood pressure 127/78, pulse 84, temperature 97.8 F (36.6 C), resp. rate (!) 25, height 5' 2" (1.575 m), weight 83.1 kg, SpO2 91 %.        Intake/Output Summary (Last 24 hours) at 01/24/2021 1243 Last data filed at 01/24/2021 1100 Gross per 24 hour  Intake 2008.01 ml  Output 700 ml  Net 1308.01 ml    Filed Weights   01/22/21 0418 01/23/21 0437 01/24/21 0500  Weight: 82.6 kg 84.6 kg 83.1 kg    Examination: General: Elderly woman comfortable, sitting up in chair. HEENT: [place]/AT, sclera anicteric, moist mucous membranes Neuro: Awake, alert, moves all extremities, nonfocal CV: tachycardic, Irregularly irregular PULM: Decreased bilateral breath sounds, no wheezing. GI: Nondistended, nontender, positive bowel sounds Extremities: No edema Skin: No rash  Resolved Hospital Problem list     Assessment & Plan:  Recurrent bilateral, left greater than right, pleural effusions and Pericardial Effusion -8/11 CT chest with moderate left and small right pleural effusions. Adjacent bibasilar and lingular atelectasis. Increased, moderate-sized pericardial effusion. Biatrial enlargement with mild pulmonary edema. No masses  -Underwent left thoracentesis during previous admission 8/14 with  950cc drained c/w exudate. Cytology negative for malignant cells with reactive mesothelial cells present  -Repeat left thoracentesis by IR 8/28 with 1L of amber exudative fluid removed, with neutrophil predominance. Cytology is negative for malignancy. -Etiology is concerning for inflammatory cause with elevated ESR, CRP and rheumatoid factor with neutrophil predominance on pleural fluid. ANA and anti-histone ab are negative. Leukocytosis with concern of   HCPA  -WBC 14.0 on admit but procal <0.10 and afebrile.  Atrial  fibrillation  P: -Check SSA/SSB, CCP. F/u quantiferon gold. - Continue prednisone 40mg daily, and will likely continue this for 2-4 weeks, and then plan steroid taper from there.  - Will repeat left thoracentesis tomorrow at 1pm in endo suite. Stop heparin at 6am on 9/2 - Will evaluate right pleural effusion tomorrow as well and plan for right thoracentesis on Saturday if needed. - Can complete 5 day course of cefepime.    Labs   CBC: Recent Labs  Lab 01/19/21 1803 01/20/21 0313 01/21/21 0116 01/23/21 0553 01/24/21 0251  WBC 15.3* 14.0* 11.6* 9.4 6.1  NEUTROABS 13.0*  --   --   --   --   HGB 14.8 12.8 12.1 12.8 12.3  HCT 45.2 38.9 37.2 39.4 37.9  MCV 89.3 88.8 88.8 89.3 89.8  PLT 241 206 209 270 292    Basic Metabolic Panel: Recent Labs  Lab 01/19/21 1803 01/20/21 0313 01/21/21 0116 01/24/21 0251  NA 134* 135 136 138  K 3.2* 3.2* 3.9 4.2  CL 95* 101 103 106  CO2 28 26 30 25  GLUCOSE 118* 114* 126* 168*  BUN 13 15 18 17  CREATININE 0.81 0.84 0.74 0.59  CALCIUM 8.4* 8.2* 8.1* 8.3*  MG  --  1.9 1.9  --    GFR: Estimated Creatinine Clearance: 56 mL/min (by C-G formula based on SCr of 0.59 mg/dL). Recent Labs  Lab 01/19/21 1803 01/20/21 0313 01/21/21 0116 01/23/21 0553 01/24/21 0251  PROCALCITON  --  <0.10  --   --   --   WBC 15.3* 14.0* 11.6* 9.4 6.1  LATICACIDVEN 1.6  --   --   --   --     Liver Function Tests: Recent Labs  Lab 01/19/21 1803  AST 26  ALT 31  ALKPHOS 88  BILITOT 1.1  PROT 6.0*  ALBUMIN 2.8*   HbA1C: Hgb A1c MFr Bld  Date/Time Value Ref Range Status  12/23/2020 10:12 PM 5.4 4.8 - 5.6 % Final    Comment:    (NOTE) Pre diabetes:          5.7%-6.4%  Diabetes:              >6.4%  Glycemic control for   <7.0% adults with diabetes     CBG: Recent Labs  Lab 01/19/21 1712  GLUCAP 122*     Critical care time: N/A     , MD [facility] Office: [phone]   See Amion for personal  pager PCCM on call pager [phone] until 7pm. Please call Elink 7p-7a. [phone]         

## 2021-01-24 NOTE — Progress Notes (Signed)
RT setup patients home CPAP at bedside and added sterile water.  RT will continue to monitor.

## 2021-01-24 NOTE — Progress Notes (Addendum)
ANTICOAGULATION CONSULT NOTE - Follow Up Consult  Pharmacy Consult for heparin Indication: atrial fibrillation (Eliquis on hold)  Allergies  Allergen Reactions   Azithromycin Other (See Comments)    Pain in arm with IV infusion    Condrolite [Glucosamine-Chondroitin-Msm] Rash    Patient Measurements: Height: 5\' 2"  (157.5 cm) Weight: 83.1 kg (183 lb 3.2 oz) IBW/kg (Calculated) : 50.1 Heparin Dosing Weight: 69 kg  Vital Signs: Temp: 97.9 F (36.6 C) (09/01 0423) Temp Source: Oral (09/01 0423) BP: 119/78 (09/01 0423) Pulse Rate: 86 (09/01 0423)  Labs: Recent Labs    01/22/21 1824 01/23/21 0553 01/23/21 1108 01/23/21 1615 01/24/21 0251  HGB  --  12.8  --   --  12.3  HCT  --  39.4  --   --  37.9  PLT  --  270  --   --  292  APTT 39* 65*  --  65* 114*  HEPARINUNFRC >1.10*  --  >1.10*  --  0.73*  CREATININE  --   --   --   --  0.59     Estimated Creatinine Clearance: 56 mL/min (by C-G formula based on SCr of 0.59 mg/dL).   Medications:  Infusions:   ceFEPime (MAXIPIME) IV 2 g (01/23/21 2033)   diltiazem (CARDIZEM) infusion 5 mg/hr (01/24/21 0457)   heparin 1,600 Units/hr (01/23/21 1929)    Assessment: 80 yo F on anticoagulation PTA for hx afib.  Oral anticoagulation has been held pending possible need for procedures.  Pt continues on heparin at 1600 units/hr with aPTT above goal  Goal of Therapy:  Heparin level 0.3-0.7 units/ml aPTT 66-102 seconds Monitor platelets by anticoagulation protocol: Yes   Plan:  Decrease heparin infusion to 1500 units/hr Check heparin level and aPTT with AM labs  96, PharmD Clinical Pharmacist **Pharmacist phone directory can now be found on [email or web address] ([email or web address]).  Listed under [facility].   Addendum Plans for thoracentesis at noon on 9/2  Plan -Stop heparin at 6am on 9/2 per MD request  11/2, PharmD Clinical Pharmacist **Pharmacist phone directory can now be found on [email or web address] ([email or web address]).  Listed under  [facility].

## 2021-01-24 NOTE — Progress Notes (Addendum)
Progress Note  Patient Name: Rebecca Short Date of Encounter: 01/24/2021  Primary Cardiologist: [name], MD   Subjective   Feels great today with no specific complaints. Breathing better    Inpatient Medications    Scheduled Meds:  colchicine  0.6 mg Oral Daily   metoprolol tartrate  12.5 mg Oral BID   polyvinyl alcohol  1 drop Both Eyes BID   predniSONE  40 mg Oral Q breakfast   sodium chloride flush  3 mL Intravenous Q12H   Continuous Infusions:  ceFEPime (MAXIPIME) IV 2 g (01/23/21 2033)   diltiazem (CARDIZEM) infusion 5 mg/hr (01/24/21 0457)   heparin 1,500 Units/hr (01/24/21 0712)   PRN Meds: acetaminophen **OR** [DISCONTINUED] acetaminophen   Vital Signs    Vitals:   01/23/21 2356 01/24/21 0423 01/24/21 0500 01/24/21 0737  BP: 94/79 119/78  (!) 125/59  Pulse: 81 86  97  Resp: 17 (!) 25  20  Temp: 97.9 F (36.6 C) 97.9 F (36.6 C)  97.8 F (36.6 C)  TempSrc: Oral Oral  Oral  SpO2: 93% 92%  93%  Weight:   83.1 kg   Height:        Intake/Output Summary (Last 24 hours) at 01/24/2021 0755 Last data filed at 01/24/2021 0439 Gross per 24 hour  Intake 1720.61 ml  Output 700 ml  Net 1020.61 ml   Filed Weights   01/22/21 0418 01/23/21 0437 01/24/21 0500  Weight: 82.6 kg 84.6 kg 83.1 kg    Physical Exam   General: Well developed, well nourished, NAD Neck: Negative for carotid bruits. No JVD Lungs:Clear to ausculation bilaterally. No wheezes, rales, or rhonchi. Breathing is unlabored. Cardiovascular: Irregularly irregular. No murmurs Abdomen: Soft, non-tender, non-distended. No obvious abdominal masses. Extremities: No edema. Radial pulses 2+ bilaterally Neuro: Alert and oriented. No focal deficits. No facial asymmetry. MAE spontaneously. Psych: Responds to questions appropriately with normal affect.    Labs    Chemistry Recent Labs  Lab 01/19/21 1803 01/20/21 0313 01/21/21 0116 01/24/21 0251  NA 134* 135 136 138  K 3.2* 3.2* 3.9 4.2   CL 95* 101 103 106  CO2 28 26 30 25   GLUCOSE 118* 114* 126* 168*  BUN 13 15 18 17   CREATININE 0.81 0.84 0.74 0.59  CALCIUM 8.4* 8.2* 8.1* 8.3*  PROT 6.0*  --   --   --   ALBUMIN 2.8*  --   --   --   AST 26  --   --   --   ALT 31  --   --   --   ALKPHOS 88  --   --   --   BILITOT 1.1  --   --   --   GFRNONAA >60 >60 >60 >60  ANIONGAP 11 8 3* 7     Hematology Recent Labs  Lab 01/21/21 0116 01/23/21 0553 01/24/21 0251  WBC 11.6* 9.4 6.1  RBC 4.19 4.41 4.22  HGB 12.1 12.8 12.3  HCT 37.2 39.4 37.9  MCV 88.8 89.3 89.8  MCH 28.9 29.0 29.1  MCHC 32.5 32.5 32.5  RDW 14.4 14.5 14.4  PLT 209 270 292    Cardiac EnzymesNo results for input(s): TROPONINI in the last 168 hours. No results for input(s): TROPIPOC in the last 168 hours.   BNP Recent Labs  Lab 01/19/21 1803 01/23/21 0553  BNP 102.3* 53.4     DDimer No results for input(s): DDIMER in the last 168 hours.   Radiology  No results found.  Telemetry    01/24/21 AF with rates in the 80-100 - Personally Reviewed  ECG    No new tracing as of 01/24/21 - Personally Reviewed  Cardiac Studies   Echo 01/20/21:  1. Left ventricular ejection fraction, by estimation, is 60 to 65%. The  left ventricle has normal function. The left ventricle has no regional  wall motion abnormalities.   2. A small pericardial effusion is present. There is no evidence of cardiac tamponade.   3. The inferior vena cava is normal in size with greater than 50%  respiratory variability, suggesting right atrial pressure of 3 mmHg.   Patient Profile     80 y.o. female with a hx of permanent atrial fibrillation, recent pericardial/pleural effusion with drainage who is being seen 01/20/2021 for the evaluation of atrial fibrillation with RVR.  Assessment & Plan    1. Atrial fibrillation with RVR: -Telemetry with HRs in the 80-100's>>>slightly improved from yesterday  -Treated with IV diltiazem  -Felt to be secondary to pericarditis  inflammation +/- HCAP -Stable echo from 01/20/21 -BPs more stable today>>continue Toprol to 25mg  and follow BP closely  -Continue IV heparin>>possibly going for repeat thora -CBC stable    2. Pericardial effusion: -s/p pericardiocentesis/thoracentesis -Echo 01/20/21 with small pericardial effusion  -CXR with bilateral pleural effusions>>sp thoracentesis>>see below    3. Bilateral pleural effusions: -Seen by pulmonology -Thoro cytology sent>>ANA negative, RA positive at 57.2 -Continue empiric antibiotics  -CXR with bilateral pleural effusions with adjacent atelectasis -Follow pulm recommendations>>possible repeat thoracentesis versus chest tube placement  -Per pulm notes>>there is concern for inflammatory etiology -Started on prednisone yesterday    4. Pericarditis: -Inflammatory markers positive with sed rate at 30/CRP 23.6 -Continue colchicine  -No reports of chest pain   Signed, 01/22/21 NP-C [facility] Pager: [phone] 01/24/2021, 7:55 AM     For questions or updates, please contact   Please consult [email or web address] for contact info under Cardiology/STEMI.   History and all data above reviewed.  Patient examined.  I agree with the findings as above.  She thinks that she is breathing better but not at baseline.  The patient exam reveals COR:RRR  ,  Lungs: Decreased breath sounds bilaterally  ,  Abd: Positive bowel sounds, no rebound no guarding, Ext Mild diffuse edema  .  All available labs, radiology testing, previous records reviewed. Agree with documented assessment and plan.   Atrial fib:  Rate is controlled.  Today continue IV Dilt and heparin.  Perhaps transition to PO rate control tomorrow.  Pleural effusion:  CXR today with increased effusions.  She will need repeat thoracentesis.  Pericardial effusion:  Follow clinically and decide on timing of repeat echo in the future likely in several weeks to a few months.  03/26/2021 Telitha Plath  11:23 AM  01/24/2021

## 2021-01-25 ENCOUNTER — Telehealth: Payer: Self-pay | Admitting: Pulmonary Disease

## 2021-01-25 ENCOUNTER — Inpatient Hospital Stay (HOSPITAL_COMMUNITY): Payer: Medicare Other

## 2021-01-25 ENCOUNTER — Encounter (HOSPITAL_COMMUNITY)
Admission: EM | Disposition: A | Discharge status: Home / Self Care | Payer: Self-pay | Source: Home / Self Care | Attending: Internal Medicine

## 2021-01-25 ENCOUNTER — Encounter (HOSPITAL_COMMUNITY): Payer: Self-pay | Admitting: Internal Medicine

## 2021-01-25 DIAGNOSIS — I4891 Unspecified atrial fibrillation: Secondary | ICD-10-CM | POA: Diagnosis not present

## 2021-01-25 DIAGNOSIS — J90 Pleural effusion, not elsewhere classified: Secondary | ICD-10-CM

## 2021-01-25 HISTORY — PX: THORACENTESIS: SHX235

## 2021-01-25 LAB — BODY FLUID CELL COUNT WITH DIFFERENTIAL
Eos, Fluid: 0 %
Lymphs, Fluid: 80 %
Monocyte-Macrophage-Serous Fluid: 1 % — ABNORMAL LOW (ref 50–90)
Neutrophil Count, Fluid: 17 % (ref 0–25)
Total Nucleated Cell Count, Fluid: 538 cu mm (ref 0–1000)

## 2021-01-25 LAB — APTT: aPTT: 106 seconds — ABNORMAL HIGH (ref 24–36)

## 2021-01-25 LAB — TSH: TSH: 2.104 u[IU]/mL (ref 0.350–4.500)

## 2021-01-25 LAB — ALBUMIN, PLEURAL OR PERITONEAL FLUID: Albumin, Fluid: 1.3 g/dL

## 2021-01-25 LAB — CBC
HCT: 37.6 % (ref 36.0–46.0)
Hemoglobin: 12 g/dL (ref 12.0–15.0)
MCH: 28.4 pg (ref 26.0–34.0)
MCHC: 31.9 g/dL (ref 30.0–36.0)
MCV: 89.1 fL (ref 80.0–100.0)
Platelets: 319 10*3/uL (ref 150–400)
RBC: 4.22 MIL/uL (ref 3.87–5.11)
RDW: 14.5 % (ref 11.5–15.5)
WBC: 9 10*3/uL (ref 4.0–10.5)
nRBC: 0 % (ref 0.0–0.2)

## 2021-01-25 LAB — QUANTIFERON-TB GOLD PLUS: QuantiFERON-TB Gold Plus: UNDETERMINED — AB

## 2021-01-25 LAB — BASIC METABOLIC PANEL
Anion gap: 8 (ref 5–15)
BUN: 23 mg/dL (ref 8–23)
CO2: 25 mmol/L (ref 22–32)
Calcium: 8.7 mg/dL — ABNORMAL LOW (ref 8.9–10.3)
Chloride: 106 mmol/L (ref 98–111)
Creatinine, Ser: 0.6 mg/dL (ref 0.44–1.00)
GFR, Estimated: >60 mL/min (ref >60)
Glucose, Bld: 119 mg/dL — ABNORMAL HIGH (ref 70–99)
Potassium: 4.2 mmol/L (ref 3.5–5.1)
Sodium: 139 mmol/L (ref 135–145)

## 2021-01-25 LAB — QUANTIFERON-TB GOLD PLUS (RQFGPL)
QuantiFERON Mitogen Value: 0.35 IU/mL
QuantiFERON Nil Value: 0.05 IU/mL
QuantiFERON TB1 Ag Value: 0 IU/mL
QuantiFERON TB2 Ag Value: 0.06 IU/mL

## 2021-01-25 LAB — LACTATE DEHYDROGENASE, PLEURAL OR PERITONEAL FLUID: LD, Fluid: 61 U/L — ABNORMAL HIGH (ref 3–23)

## 2021-01-25 LAB — MAGNESIUM: Magnesium: 2 mg/dL (ref 1.7–2.4)

## 2021-01-25 LAB — PROTEIN, PLEURAL OR PERITONEAL FLUID: Total protein, fluid: <3 g/dL

## 2021-01-25 LAB — GLUCOSE, PLEURAL OR PERITONEAL FLUID: Glucose, Fluid: 105 mg/dL

## 2021-01-25 LAB — HEPARIN LEVEL (UNFRACTIONATED): Heparin Unfractionated: 0.58 IU/mL (ref 0.30–0.70)

## 2021-01-25 SURGERY — THORACENTESIS
Anesthesia: LOCAL

## 2021-01-25 MED ORDER — METOPROLOL TARTRATE 12.5 MG HALF TABLET
12.5000 mg | ORAL_TABLET | Freq: Four times a day (QID) | ORAL | Status: DC
  Administered 2021-01-25 (×2): 12.5 mg via ORAL
  Filled 2021-01-25 (×2): qty 1

## 2021-01-25 NOTE — Progress Notes (Signed)
ANTICOAGULATION CONSULT NOTE - Follow Up Consult  Pharmacy Consult for heparin Indication: atrial fibrillation (Eliquis on hold)  Allergies  Allergen Reactions   Azithromycin Other (See Comments)    Pain in arm with IV infusion    Condrolite [Glucosamine-Chondroitin-Msm] Rash    Patient Measurements: Height: 5\' 2"  (157.5 cm) Weight: 84.2 kg (185 lb 10 oz) IBW/kg (Calculated) : 50.1 Heparin Dosing Weight: 69 kg  Vital Signs: Temp: 97.6 F (36.4 C) (09/02 1257) Temp Source: Temporal (09/02 1257) BP: 129/91 (09/02 1441) Pulse Rate: 101 (09/02 1441)  Labs: Recent Labs    01/23/21 0553 01/23/21 1108 01/23/21 1615 01/24/21 0251 01/24/21 1546 01/25/21 0219  HGB 12.8  --   --  12.3  --  12.0  HCT 39.4  --   --  37.9  --  37.6  PLT 270  --   --  292  --  319  APTT 65*  --    < > 114* 59* 106*  HEPARINUNFRC  --  >1.10*  --  0.73*  --  0.58  CREATININE  --   --   --  0.59  --  0.60   < > = values in this interval not displayed.     Estimated Creatinine Clearance: 56.4 mL/min (by C-G formula based on SCr of 0.6 mg/dL).   Medications:  Infusions:   diltiazem (CARDIZEM) infusion 5 mg/hr (01/25/21 0104)    Assessment: 80 yo F on anticoagulation PTA for hx afib.  Oral anticoagulation has been held pending possible need for procedures.    Heparin was stopped this morning for thoracentesis and she may need another thoracentesis on 9/3. Discussed with Dr. 11/3 and will not restart heparin at this time.   Goal of Therapy:  Heparin level 0.3-0.7 units/ml aPTT 66-102 seconds Monitor platelets by anticoagulation protocol: Yes   Plan:  -No plans to restart heparin today -Will follow procedural plans on 9/3  11/3, PharmD Clinical Pharmacist **Pharmacist phone directory can now be found on [email or web address] ([email or web address]).  Listed under [facility].

## 2021-01-25 NOTE — Telephone Encounter (Signed)
Scheduled for HFU with Dr Francine Graven for 03/06/21 and cxr ordered as well

## 2021-01-25 NOTE — Op Note (Signed)
Thoracentesis  Procedure Note  Rebecca Short  [id number]  June 17, 1940  Date:01/25/21  Time:2:00 PM   Provider Performing:Dvontae Ruan B Caidynce Muzyka   Procedure: Thoracentesis with imaging guidance (92446)  Indication(s) Pleural Effusion  Consent Risks of the procedure as well as the alternatives and risks of each were explained to the patient and/or caregiver.  Consent for the procedure was obtained and is signed in the bedside chart  Anesthesia Topical only with 1% lidocaine    Time Out Verified patient identification, verified procedure, site/side was marked, verified correct patient position, special equipment/implants available, medications/allergies/relevant history reviewed, required imaging and test results available.   Sterile Technique Maximal sterile technique including full sterile barrier drape, hand hygiene, sterile gown, sterile gloves, mask, hair covering, sterile ultrasound probe cover (if used).  Procedure Description Ultrasound was used to identify appropriate pleural anatomy for placement and overlying skin marked.  Area of drainage cleaned and draped in sterile fashion. Lidocaine was used to anesthetize the skin and subcutaneous tissue.  800 cc's of amber appearing fluid was drained from the left pleural space. Catheter then removed and bandaid applied to site.   Complications/Tolerance None; patient tolerated the procedure well. Chest X-ray is ordered to confirm no post-procedural complication.   EBL Minimal   Specimen(s) Pleural fluid

## 2021-01-25 NOTE — Progress Notes (Addendum)
Progress Note  Patient Name: Rebecca Short Date of Encounter: 01/25/2021  [facility] Cardiologist: Larae Grooms, MD   Subjective   Patient is eating breakfast in bed this AM. She states she is feeling much improved, was able to tolerating sitting in the chair for 3 hours yesterday, felt weakness and SOB are better. She states she is having some cough that is also improved. She denied any chest pain, fever, chills, leg swelling. She states she is waiting to go for thoracentesis today.   Inpatient Medications    Scheduled Meds:  colchicine  0.6 mg Oral Daily   metoprolol tartrate  12.5 mg Oral BID   polyvinyl alcohol  1 drop Both Eyes BID   predniSONE  40 mg Oral Q breakfast   sodium chloride flush  3 mL Intravenous Q12H   Continuous Infusions:  diltiazem (CARDIZEM) infusion 5 mg/hr (01/25/21 0104)   PRN Meds: acetaminophen **OR** [DISCONTINUED] acetaminophen   Vital Signs    Vitals:   01/24/21 1631 01/24/21 2050 01/24/21 2200 01/25/21 0354  BP: 119/81 132/83 132/87   Pulse: (!) 106 96 (!) 102   Resp: (!) 32 (!) 25  19  Temp: 98.1 F (36.7 C) 98.1 F (36.7 C)  (!) 97.5 F (36.4 C)  TempSrc: Oral Oral  Oral  SpO2: 90% 93%    Weight:    84.2 kg  Height:        Intake/Output Summary (Last 24 hours) at 01/25/2021 0750 Last data filed at 01/24/2021 1900 Gross per 24 hour  Intake 758.21 ml  Output 300 ml  Net 458.21 ml   Last 3 Weights 01/25/2021 01/24/2021 01/23/2021  Weight (lbs) 185 lb 10 oz 183 lb 3.2 oz 186 lb 9.6 oz  Weight (kg) 84.2 kg 83.1 kg 84.641 kg      Telemetry    A fib with rate of 90-100s  - Personally Reviewed  ECG    No new tracing this AM - Personally Reviewed  Physical Exam   GEN: No acute distress. Sitting in bed, eating breakfast.  Cardiac: Irregularly irregular, no murmurs, rubs, or gallops.  Respiratory: Clear to auscultation but diminished at base bilaterally. Speaks full sentence. On room air. Pox 93% GI: Soft, nontender MS:  Trace LLE edema; No deformity. Neuro:  Nonfocal  Psych: Normal affect   Labs    High Sensitivity Troponin:   Recent Labs  Lab 01/03/21 0951 01/03/21 1338  TROPONINIHS 7 5      Chemistry Recent Labs  Lab 01/19/21 1803 01/20/21 0313 01/21/21 0116 01/24/21 0251 01/25/21 0219  NA 134*   < > 136 138 139  K 3.2*   < > 3.9 4.2 4.2  CL 95*   < > 103 106 106  CO2 28   < > $R'30 25 25  'Vh$ GLUCOSE 118*   < > 126* 168* 119*  BUN 13   < > $R'18 17 23  'HC$ CREATININE 0.81   < > 0.74 0.59 0.60  CALCIUM 8.4*   < > 8.1* 8.3* 8.7*  PROT 6.0*  --   --   --   --   ALBUMIN 2.8*  --   --   --   --   AST 26  --   --   --   --   ALT 31  --   --   --   --   ALKPHOS 88  --   --   --   --   BILITOT 1.1  --   --   --   --  GFRNONAA >60   < > >60 >60 >60  ANIONGAP 11   < > 3* 7 8   < > = values in this interval not displayed.     Hematology Recent Labs  Lab 01/23/21 0553 01/24/21 0251 01/25/21 0219  WBC 9.4 6.1 9.0  RBC 4.41 4.22 4.22  HGB 12.8 12.3 12.0  HCT 39.4 37.9 37.6  MCV 89.3 89.8 89.1  MCH 29.0 29.1 28.4  MCHC 32.5 32.5 31.9  RDW 14.5 14.4 14.5  PLT 270 292 319    BNP Recent Labs  Lab 01/19/21 1803 01/23/21 0553  BNP 102.3* 53.4     DDimer No results for input(s): DDIMER in the last 168 hours.   Radiology    DG Chest 2 View  Result Date: 01/24/2021 CLINICAL DATA:  Dyspnea R06.00 (ICD-10-CM) EXAM: CHEST - 2 VIEW COMPARISON:  01/22/2021. FINDINGS: Increased moderate to large left and moderate right pleural effusions. Mildly increased overlying opacities. No visible pneumothorax. No acute osseous abnormality. Cardiomediastinal silhouette is largely obscured. IMPRESSION: 1. Increased moderate to large left and moderate right pleural effusions. 2. Mildly increased overlying opacities, most likely atelectasis. Infection is not excluded. Electronically Signed   By: Margaretha Sheffield M.D.   On: 01/24/2021 13:08    Cardiac Studies   Echo from 01/20/21:   1. Left ventricular  ejection fraction, by estimation, is 60 to 65%. The  left ventricle has normal function. The left ventricle has no regional  wall motion abnormalities.   2. A small pericardial effusion is present. There is no evidence of  cardiac tamponade.   3. The inferior vena cava is normal in size with greater than 50%  respiratory variability, suggesting right atrial pressure of 3 mmHg.   Comparison(s): No significant change from prior study. 1. Left ventricular ejection fraction, by estimation, is 60 to 65%. The  left ventricle has normal function. The left ventricle has no regional  wall motion abnormalities.   2. A small pericardial effusion is present. There is no evidence of  cardiac tamponade.   3. The inferior vena cava is normal in size with greater than 50%  respiratory variability, suggesting right atrial pressure of 3 mmHg.   Comparison(s): No significant change from prior study.  Patient Profile     80 y.o. female with PMH of permanent atrial fibrillation, obesity, OSA on CPAP, recently admitted for A fib RVR with hypotension 7/31-8/2 and for pneumonia with pericardial and pleural effusions requiring pericardiocentesis and thoracentesis on 8/11-8/19, who presented with generalized weakness and SOB on 01/19/21,  now admitted for recurrent bilateral pleural effusions, repeat left thoracentesis by IR on 01/20/21 appears exudative, cytology negative. She was given cefepime for suspected HCAP. Pulmonology following and initiated steroid. Cardiology consulted for A fib RVR.    Assessment & Plan    Permanent Atrial fibrillation with RVR - due to inflammation from pericarditis +/- HCAP - Echo without acute change from 01/20/21  - rate controlled on metoprolol 12.$RemoveBeforeDE'5mg'LROeGXoWWqzqUev$  BID and cardizem gtt at $Remo'5mg'pOHkw$ /hr now - Eliqiuis held, on hepatin gtt with anticipation of repeat thoracentesis today, CHA2DS2VAS score is  3  - will check Mag today, optimize to keep Mag >2 and K >4  - TSH WNL from 12/24/20, will repeat  TSH today   Pericardial effusion - underwent pericardiocentesis on 01/07/21 during last admission - Echo 01/20/21 with small pericardial effusion  - no clinical evidence of cardiac tamponade today   Pericarditis - ESR and CRP elevated 01/20/21 - colchicine was started  during last admission after pericardiocentesis and continued here  - prednisone started per pulmonology this admission - asymptomatic clinically today     Hx of low normal EF 12/24/20 - 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  Patient examined.  I agree with the findings as above.  Breathing better post thoracentesis.    The patient exam reveals [id number]   ,  Lungs:   Decreased breath sounds right greater than left with left coarse crackles  ,  Abd: Positive bowel sounds, no rebound no guarding, Ext No edema  .  All available labs, radiology testing, previous records reviewed. Agree with documented assessment and plans:  Breathing improved.  Atrial rate OK.  Resume DOAC if no further invasive procedures.   I am going to try to stop the IV Dilt and increase the beta blocker.   Jeneen Rinks Olney Monier  3:15 PM  01/25/2021

## 2021-01-25 NOTE — Telephone Encounter (Signed)
Please schedule patient for hospital follow up with me for pleural effusion in 4-6 weeks. She will need chest x-ray as well.  Thanks, Cletis Athens

## 2021-01-25 NOTE — Addendum Note (Signed)
Addended by: Christen Butter on: 01/25/2021 03:56 PM   Modules accepted: Orders

## 2021-01-25 NOTE — Progress Notes (Signed)
PT Cancellation Note  Patient Details Name: Rebecca Short MRN: [id number] DOB: 1940/09/12   Cancelled Treatment:    Reason Eval/Treat Not Completed: Other (comment), pt pleasantly declined PT treatment session today having just worked with OT and awaiting thoracentesis. Will follow-up for PT treatment as schedule permits.  Ina Homes, PT, DPT Acute Rehabilitation Services  Pager [phone] Office [phone]  Malachy Chamber 01/25/2021, 10:56 AM

## 2021-01-25 NOTE — Evaluation (Signed)
Occupational Therapy Evaluation Patient Details Name: Rebecca Short MRN: [id number] DOB: [birth year] Today's Date: 01/25/2021    History of Present Illness Pt is an 80yo female presenting to [facility] ED on 8/27 with increasing weakness and SOB. Recently discharged after bilateral pleural/pericardial effusions and returned  secondary to continued symptoms.  Thoracocentsis performed 8/28 with 1L drained. PMH: afib with RVR, OSA, leukocytosis, macular degeneration.   Clinical Impression   Pt admitted with the above diagnoses and presents with below problem list. Pt will benefit from continued acute OT to address the below listed deficits and maximize independence with basic ADLs prior to d/c home. At baseline, pt is mod I with most ADLs, some assist with bathing. Pt limited by DOE 3/4, HR up to 120 with minimal bed mobility activity, and increased cough with movement. Noted that pt is awaiting thoracocentsis and expect she will progress well with ADLs and mobility after procedure.       Follow Up Recommendations  Home health OT;Supervision - Intermittent    Equipment Recommendations  None recommended by OT (pt's walk in shower is too small for a shower seat per her report)    Recommendations for Other Services       Precautions / Restrictions Precautions Precautions: Fall Precaution Comments: Monitor HR and O2 Restrictions Weight Bearing Restrictions: No      Mobility Bed Mobility Overal bed mobility: Needs Assistance Bed Mobility: Supine to Sit;Sit to Supine     Supine to sit: Supervision;Min guard Sit to supine: Supervision   General bed mobility comments: Pt supervision to min guard for safety and line managment only.    Transfers                 General transfer comment: unable to assess today . HR up to 120 while attempting to come to EOB, DOE 3/4 and increased cough.    Balance Overall balance assessment: Needs assistance Sitting-balance support: No upper extremity  supported;Feet unsupported Sitting balance-Leahy Scale: Fair                                     ADL either performed or assessed with clinical judgement   ADL Overall ADL's : Needs assistance/impaired Eating/Feeding: Set up   Grooming: Minimal assistance;Sitting   Upper Body Bathing: Minimal assistance;Sitting   Lower Body Bathing: Moderate assistance   Upper Body Dressing : Minimal assistance;Sitting   Lower Body Dressing: Moderate assistance                 General ADL Comments: Pt with DOE 3/4, increased coughing and HR up to 120 while attempting to come to EOB. Pt with fair static sitting balance with feet unsupported. Did not progress pt farther given onset of these symptoms. Pt due for thoracentisis and expect she will progress well once that has been completed.     Vision Baseline Vision/History: 1 Wears glasses;6 Macular Degeneration Wears Glasses: At all times       Perception     Praxis      Pertinent Vitals/Pain Pain Assessment: No/denies pain     Hand Dominance Right   Extremity/Trunk Assessment Upper Extremity Assessment Upper Extremity Assessment: Generalized weakness   Lower Extremity Assessment Lower Extremity Assessment: Defer to PT evaluation       Communication Communication Communication: No difficulties   Cognition Arousal/Alertness: Awake/alert Behavior During Therapy: WFL for tasks assessed/performed Overall Cognitive Status: Within Functional Limits  for tasks assessed                                     General Comments  HR up to 120 attempting to come to EOB. DOE 3/4 and increased coughing. Cued pt to return to supine and ceased further attempts at EOB/OOB activity.    Exercises     Shoulder Instructions      Home Living Family/patient expects to be discharged to:: Private residence Living Arrangements: Spouse/significant other (Husband Merlyn Albert) Available Help at Discharge: Family;Available  24 hours/day;Neighbor Type of Home: House Home Access: Stairs to enter [employer] of Steps: 3 Entrance Stairs-Rails: Right;Left Home Layout: One level     Bathroom Shower/Tub: Producer, television/film/video: Handicapped height     Home Equipment: Environmental consultant - 2 wheels;Cane - single point;Bedside commode;Adaptive equipment;Grab bars - tub/shower;Grab bars - toilet Adaptive Equipment: Long-handled shoe horn;Reacher Additional Comments: Lilly Cove is a retired Engineer, civil (consulting) and is very helpful with assisting      Prior Functioning/Environment Level of Independence: Needs assistance  Gait / Transfers Assistance Needed: Uses RW for mobility tasks ADL's / Homemaking Assistance Needed: Husband or neighbor helps with ADLs as needed, including bathing, cooking.            OT Problem List: Decreased strength;Decreased activity tolerance;Impaired balance (sitting and/or standing);Decreased knowledge of use of DME or AE;Cardiopulmonary status limiting activity      OT Treatment/Interventions: Self-care/ADL training;Therapeutic exercise;DME and/or AE instruction;Therapeutic activities;Patient/family education;Balance training;Energy conservation    OT Goals(Current goals can be found in the care plan section) Acute Rehab OT Goals Patient Stated Goal: return to baseline with ADLs and enjoyable activities OT Goal Formulation: With patient Time For Goal Achievement: 02/08/21 Potential to Achieve Goals: Good ADL Goals Pt Will Perform Grooming: with modified independence;sitting;standing Pt Will Perform Upper Body Dressing: with modified independence;sitting Pt Will Perform Lower Body Dressing: with modified independence;sit to/from stand;sitting/lateral leans Pt Will Transfer to Toilet: with modified independence;ambulating  OT Frequency: Min 2X/week   Barriers to D/C:            Co-evaluation              AM-PAC OT "6 Clicks" Daily Activity     Outcome Measure Help  from another person eating meals?: None Help from another person taking care of personal grooming?: A Little Help from another person toileting, which includes using toliet, bedpan, or urinal?: A Lot Help from another person bathing (including washing, rinsing, drying)?: A Lot Help from another person to put on and taking off regular upper body clothing?: A Little Help from another person to put on and taking off regular lower body clothing?: A Lot 6 Click Score: 16   End of Session Nurse Communication: Mobility status  Activity Tolerance: Patient tolerated treatment well Patient left: in bed;with call bell/phone within reach  OT Visit Diagnosis: Unsteadiness on feet (R26.81);Muscle weakness (generalized) (M62.81)                Time: 7341-9379 OT Time Calculation (min): 22 min Charges:  OT General Charges $OT Visit: 1 Visit OT Evaluation $OT Eval Moderate Complexity: 1 Mod  Raynald Kemp, OT Acute Rehabilitation Services Pager: [phone] Office: [phone]   Pilar Grammes 01/25/2021, 11:11 AM

## 2021-01-25 NOTE — Progress Notes (Signed)
Rebecca Short  VAP:[id number] DOB: [birth year] DOA: 01/19/2021 PCP: Lawerance Cruel, MD    Brief Narrative:  80 year old with a history of permanent atrial fibrillation, obesity, and OSA who presented to the ED with severe ongoing generalized weakness and shortness of breath, suffering with blood pressures into the 80s at home.  She was admitted in July with atrial fibrillation.  She was admitted in August with pleural effusions and a pericardial effusion.  At that time 1 L of an exudative fluid was drained from her left pleural space.  She was discharged on colchicine for her pericardial effusion.  In the ED a CXR noted a similar moderate left pleural effusion and a small increase in an otherwise small right pleural effusion.  Cardiology, IR and pulmonology consulted.  S/p left diagnostic and therapeutic thoracentesis 1 L on 8/28.  Significant Events:  7/31 - 8/2 admit for afib w/ RVR and hypotension 8/11 - 8/19 admit for PNA w/ pericardial and pleural effusions  8/14 thoracentesis - 950cc drained c/w exudate  8/15 pericardiocentesis - 570cc drained  8/16 pericardial drain removed  8/27 admit via ED 8/28, thoracentesis in IR, 1 L of amber fluid drained 8/31 starting steroids 9/2: Left thoracentesis, 800 mL of amber fluid drained  Subjective: -Feels better overall, breathing is improving  Assessment & Plan:   Chronic atrial fibrillation with RVR Cardiology following.   -Remains on metoprolol and low-dose Cardizem gtt, could increase metoprolol and wean off Cardizem gtt., will defer to cards -Apixaban switched to IV heparin due to need for thoracentesis  Recurrent bilateral pleural effusions, left >R -On 8/14 had exudative fluid drained, around 1 L, treated for pneumonia as well then, CT without evidence of mass or other concerning findings  -On 8/28, 1 L of fluid drained again, consistent with exudate, cultures are negative -Appreciate pulmonary input, concern for inflammatory  etiology in the setting of high ESR, CRP and rheumatoid factor -Additional serologies ordered, started prednisone 40 mg daily on 8/31 -Repeat chest x-ray noted moderate to large bilateral effusions -Pulmonary following, plan for thoracentesis today. -QuantiFERON gold pending but low suspicion for TB.   -Completed 5-day course of cefepime  Pericardial effusion/pericarditis -Underwent pericardiocentesis on 8/15 for large pericardial effusion, started on colchicine then  -Repeat echo with small pericardial effusion  -Continue colchicine, starting steroids, see discussion above   Tachycardia induced cardiomyopathy Suffered recent acute CHF episode felt to be due to tachycardia -was previously diagnosed and is usually on Lasix 40 mg daily  OSA on CPAP Continue usual CPAP dosing   DVT prophylaxis: IV heparin CODE STATUS: Full code Family Communication: Spouse at bedside  status is: Inpatient  Remains inpatient appropriate because:Inpatient level of care appropriate due to severity of illness  Dispo: The patient is from: Home              Anticipated d/c is to: Home next week              Patient currently is not medically stable to d/c.   Difficult to place patient No  Consultants:  [facility] Interventional radiology Pulmonology.  Antimicrobials:  Vancomycin 8/27 > Cefepime 8/27 >  Procedures Ultrasound-guided left thoracentesis 8/28: 1 L drained Left thoracentesis -by pulmonary 9/2: 800 mL drained   Objective:  Vitals:   01/24/21 2200 01/25/21 0354 01/25/21 0842 01/25/21 1257  BP: 132/87  138/67 140/76  Pulse: (!) 102  (!) 103 91  Resp:  19  (!) 29  Temp:  (!) 97.5  F (36.4 C)  97.6 F (36.4 C)  TempSrc:  Oral  Temporal  SpO2:    90%  Weight:  84.2 kg    Height:          Examination: General exam:  Pleasant elderly female sitting up in bed, AAOx3, no distress CVS: S1-S2, irregularly irregular rhythm Lungs: Decreased breath sounds with both  lungs Abdomen: Soft, nontender, bowel sounds present Extremities: No edema  Skin: No rash on exposed skin    CBC: Recent Labs  Lab 01/19/21 1803 01/20/21 0313 01/23/21 0553 01/24/21 0251 01/25/21 0219  WBC 15.3*   < > 9.4 6.1 9.0  NEUTROABS 13.0*  --   --   --   --   HGB 14.8   < > 12.8 12.3 12.0  HCT 45.2   < > 39.4 37.9 37.6  MCV 89.3   < > 89.3 89.8 89.1  PLT 241   < > 270 292 319   < > = values in this interval not displayed.   Basic Metabolic Panel: Recent Labs  Lab 01/20/21 0313 01/21/21 0116 01/24/21 0251 01/25/21 0219 01/25/21 0816  NA 135 136 138 139  --   K 3.2* 3.9 4.2 4.2  --   CL 101 103 106 106  --   CO2 $Re'26 30 25 25  'dCp$ --   GLUCOSE 114* 126* 168* 119*  --   BUN $Re'15 18 17 23  'GMH$ --   CREATININE 0.84 0.74 0.59 0.60  --   CALCIUM 8.2* 8.1* 8.3* 8.7*  --   MG 1.9 1.9  --   --  2.0   GFR: Estimated Creatinine Clearance: 56.4 mL/min (by C-G formula based on SCr of 0.6 mg/dL).  Liver Function Tests: Recent Labs  Lab 01/19/21 1803  AST 26  ALT 31  ALKPHOS 88  BILITOT 1.1  PROT 6.0*  ALBUMIN 2.8*    HbA1C: Hgb A1c MFr Bld  Date/Time Value Ref Range Status  12/23/2020 10:12 PM 5.4 4.8 - 5.6 % Final    Comment:    (NOTE) Pre diabetes:          5.7%-6.4%  Diabetes:              >6.4%  Glycemic control for   <7.0% adults with diabetes     CBG: Recent Labs  Lab 01/19/21 1712  GLUCAP 122*    Recent Results (from the past 240 hour(s))  Blood culture (routine x 2)     Status: None   Collection Time: 01/19/21  5:17 PM   Specimen: BLOOD  Result Value Ref Range Status   Specimen Description BLOOD IV START  Final   Special Requests   Final    BOTTLES DRAWN AEROBIC AND ANAEROBIC Blood Culture results may not be optimal due to an inadequate volume of blood received in culture bottles   Culture   Final    NO GROWTH 5 DAYS Performed at [facility], [place] [address], [place], [place] [address]    Report Status 01/24/2021 FINAL  Final  Resp  Panel by RT-PCR (Flu A&B, Covid) Nasopharyngeal Swab     Status: None   Collection Time: 01/19/21  6:03 PM   Specimen: Nasopharyngeal Swab; Nasopharyngeal(NP) swabs in vial transport medium  Result Value Ref Range Status   SARS Coronavirus 2 by RT PCR NEGATIVE NEGATIVE Final    Comment: (NOTE) SARS-CoV-2 target nucleic acids are NOT DETECTED.  The SARS-CoV-2 RNA is generally detectable in upper respiratory specimens during  the acute phase of infection. The lowest concentration of SARS-CoV-2 viral copies this assay can detect is 138 copies/mL. A negative result does not preclude SARS-Cov-2 infection and should not be used as the sole basis for treatment or other patient management decisions. A negative result may occur with  improper specimen collection/handling, submission of specimen other than nasopharyngeal swab, presence of viral mutation(s) within the areas targeted by this assay, and inadequate number of viral copies(<138 copies/mL). A negative result must be combined with clinical observations, patient history, and epidemiological information. The expected result is Negative.  Fact Sheet for Patients:  EntrepreneurPulse.com.au  Fact Sheet for Healthcare Providers:  IncredibleEmployment.be  This test is no t yet approved or cleared by the [place] FDA and  has been authorized for detection and/or diagnosis of SARS-CoV-2 by FDA under an Emergency Use Authorization (EUA). This EUA will remain  in effect (meaning this test can be used) for the duration of the COVID-19 declaration under Section 564(b)(1) of the Act, 21 U.S.C.section 360bbb-3(b)(1), unless the authorization is terminated  or revoked sooner.       Influenza A by PCR NEGATIVE NEGATIVE Final   Influenza B by PCR NEGATIVE NEGATIVE Final    Comment: (NOTE) The Xpert Xpress SARS-CoV-2/FLU/RSV plus assay is intended as an aid in the diagnosis of influenza from Nasopharyngeal  swab specimens and should not be used as a sole basis for treatment. Nasal washings and aspirates are unacceptable for Xpert Xpress SARS-CoV-2/FLU/RSV testing.  Fact Sheet for Patients: EntrepreneurPulse.com.au  Fact Sheet for Healthcare Providers: IncredibleEmployment.be  This test is not yet approved or cleared by the [place] FDA and has been authorized for detection and/or diagnosis of SARS-CoV-2 by FDA under an Emergency Use Authorization (EUA). This EUA will remain in effect (meaning this test can be used) for the duration of the COVID-19 declaration under Section 564(b)(1) of the Act, 21 U.S.C. section 360bbb-3(b)(1), unless the authorization is terminated or revoked.  Performed at [facility], [place] [address], [place], [place] [address]   Body fluid culture w Gram Stain     Status: None   Collection Time: 01/20/21 11:36 AM   Specimen: PATH Cytology Pleural fluid  Result Value Ref Range Status   Specimen Description PLEURAL FLUID  Final   Special Requests CYTO  Final   Gram Stain   Final    FEW WBC PRESENT,BOTH PMN AND MONONUCLEAR NO ORGANISMS SEEN    Culture   Final    NO GROWTH 3 DAYS Performed at [facility], [place] [address] [place], [place] [address]    Report Status 01/23/2021 FINAL  Final     Scheduled Meds:  [MAR Hold] colchicine  0.6 mg Oral Daily   [MAR Hold] metoprolol tartrate  12.5 mg Oral BID   [MAR Hold] polyvinyl alcohol  1 drop Both Eyes BID   [MAR Hold] predniSONE  40 mg Oral Q breakfast   [MAR Hold] sodium chloride flush  3 mL Intravenous Q12H   Continuous Infusions:  diltiazem (CARDIZEM) infusion 5 mg/hr (01/25/21 0104)     LOS: 5 days   Domenic Polite, MD,

## 2021-01-25 NOTE — Progress Notes (Signed)
NAME:  Rebecca Short, MRN:  [id number], DOB:  1940-08-10, LOS: 5 ADMISSION DATE:  01/19/2021, CONSULTATION DATE:  01/21/21 REFERRING MD:  Dr. Algis Liming, CHIEF COMPLAINT:  Recurrent dsypnea    History of Present Illness:  Rebecca Short is a 80 y.o. female with a PMH significant for permanent A-fib anticoagulated with Eliquis, obesity,  OSA on CPAP, and macular degeneration who presented to the ED with complaints of generalized weakness and shortness of breath. Of note patient was seen and treated at this facility 7/31 - 8/2 for A-fib RVR and hypotension. Subsequently admitted again 8/11-8/19 for PNA with pericardial and pleura effusions. She underwent thoracentesis 8/14 with 960ml of exudative fluid removed. On 8/15 she underwent pericardiocentesis with 570ml of fluid drained.   On ED arrival for this admission 8/27 patient was seen with tachypnea and mild tachycardia with all other vital stable. CXR was obtained and revealed moderate left pleural effusion and small right pleural effusion. IR was consulted and patient underwent left thoracentesis with 1L of amber colored fluid removed. Fluid continues to remain exudative by Lights Criteria. PCCM consulted for further assistance in management of pleural effusion. Cardiology consulted for management of pericardial effusion   Pertinent  Medical History  Permanent A-fib, obesity,  OSA on CPAP, and macular degeneration  Significant recent Events:  7/31 - 8/2 admit for afib w/ RVR and hypotension 8/11 - 8/19 admit for PNA w/ pericardial and pleural effusions 8/11 CT chest with moderate left and small right pleural effusions. Adjacent bibasilar and lingular atelectasis. Increased, moderate-sized pericardial effusion. Biatrial enlargement. Mild pulmonary edema. No masses  8/14 thoracentesis - 950cc drained c/w exudate  8/15 pericardiocentesis - 570cc drained  8/16 pericardial drain removed  8/27 admit via ED repeat thoracentesis per IR with 1L of amber  exudative fluid removed   Interim History / Subjective:   She is without complaints today. Feeling a little better than yesterday.  Objective   Blood pressure 138/67, pulse (!) 103, temperature (!) 97.5 F (36.4 C), temperature source Oral, resp. rate 19, height $RemoveBe'5\' 2"'[id number]$  (1.575 m), weight 84.2 kg, SpO2 93 %.        Intake/Output Summary (Last 24 hours) at 01/25/2021 1020 Last data filed at 01/25/2021 0819 Gross per 24 hour  Intake 695.54 ml  Output 300 ml  Net 395.54 ml    Filed Weights   01/23/21 0437 01/24/21 0500 01/25/21 0354  Weight: 84.6 kg 83.1 kg 84.2 kg    Examination: General: Elderly woman comfortable, sitting up in bed HEENT: /AT, sclera anicteric, moist mucous membranes Neuro: Awake, alert, moves all extremities, nonfocal CV: tachycardic, Irregularly irregular PULM: Decreased bilateral breath sounds at bases up to mid lung fields. no wheezing. GI: Nondistended, nontender, positive bowel sounds Extremities: No edema Skin: No rash  Resolved Hospital Problem list     Assessment & Plan:  Recurrent bilateral, left greater than right, pleural effusions and Pericardial Effusion -8/11 CT chest with moderate left and small right pleural effusions. Adjacent bibasilar and lingular atelectasis. Increased, moderate-sized pericardial effusion. Biatrial enlargement with mild pulmonary edema. No masses  -Underwent left thoracentesis during previous admission 8/14 with  950cc drained c/w exudate. Cytology negative for malignant cells with reactive mesothelial cells present  -Repeat left thoracentesis by IR 8/28 with 1L of amber exudative fluid removed, with neutrophil predominance. Cytology is negative for malignancy. -Etiology is concerning for inflammatory cause with elevated ESR, CRP and rheumatoid factor with neutrophil predominance on pleural fluid. ANA and anti-histone ab are negative.  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   Atrial fibrillation  P: -F/u SSA/SSB, CCP. F/u quantiferon gold. - Continue prednisone 40mg  daily, and will likely continue this for 2-4 weeks, and then plan steroid taper from there.  - Will repeat left thoracentesis today at 1pm in endo suite. Heparin has been stopped. - Will evaluate right pleural effusion today as well and plan for right thoracentesis on Saturday if needed.   Labs   CBC: Recent Labs  Lab 01/19/21 1803 01/20/21 0313 01/21/21 0116 01/23/21 0553 01/24/21 0251 01/25/21 0219  WBC 15.3* 14.0* 11.6* 9.4 6.1 9.0  NEUTROABS 13.0*  --   --   --   --   --   HGB 14.8 12.8 12.1 12.8 12.3 12.0  HCT 45.2 38.9 37.2 39.4 37.9 37.6  MCV 89.3 88.8 88.8 89.3 89.8 89.1  PLT 241 206 209 270 292 802    Basic Metabolic Panel: Recent Labs  Lab 01/19/21 1803 01/20/21 0313 01/21/21 0116 01/24/21 0251 01/25/21 0219 01/25/21 0816  NA 134* 135 136 138 139  --   K 3.2* 3.2* 3.9 4.2 4.2  --   CL 95* 101 103 106 106  --   CO2 28 26 30 25 25   --   GLUCOSE 118* 114* 126* 168* 119*  --   BUN 13 15 18 17 23   --   CREATININE 0.81 0.84 0.74 0.59 0.60  --   CALCIUM 8.4* 8.2* 8.1* 8.3* 8.7*  --   MG  --  1.9 1.9  --   --  2.0   GFR: Estimated Creatinine Clearance: 56.4 mL/min (by C-G formula based on SCr of 0.6 mg/dL). Recent Labs  Lab 01/19/21 1803 01/20/21 0313 01/21/21 0116 01/23/21 0553 01/24/21 0251 01/25/21 0219  PROCALCITON  --  <0.10  --   --   --   --   WBC 15.3* 14.0* 11.6* 9.4 6.1 9.0  LATICACIDVEN 1.6  --   --   --   --   --     Liver Function Tests: Recent Labs  Lab 01/19/21 1803  AST 26  ALT 31  ALKPHOS 88  BILITOT 1.1  PROT 6.0*  ALBUMIN 2.8*   HbA1C: Hgb A1c MFr Bld  Date/Time Value Ref Range Status  12/23/2020 10:12 PM 5.4 4.8 - 5.6 % Final    Comment:    (NOTE) Pre diabetes:          5.7%-6.4%  Diabetes:              >6.4%  Glycemic control for   <7.0% adults with diabetes     CBG: Recent Labs  Lab 01/19/21 1712  GLUCAP 122*      Critical care time: N/A    Freda Jackson, MD [facility] Office: [phone]   See Amion for personal pager PCCM on call pager [phone] until 7pm. Please call Elink 7p-7a. [phone]

## 2021-01-25 NOTE — Progress Notes (Signed)
Patient returned from thoracentesis, band aid intact left left side.  Patient states breathing improved. Sating 93% on room air.

## 2021-01-26 ENCOUNTER — Inpatient Hospital Stay (HOSPITAL_COMMUNITY): Payer: Medicare Other

## 2021-01-26 DIAGNOSIS — J90 Pleural effusion, not elsewhere classified: Secondary | ICD-10-CM | POA: Diagnosis not present

## 2021-01-26 DIAGNOSIS — I313 Pericardial effusion (noninflammatory): Secondary | ICD-10-CM | POA: Diagnosis not present

## 2021-01-26 DIAGNOSIS — I4891 Unspecified atrial fibrillation: Secondary | ICD-10-CM | POA: Diagnosis not present

## 2021-01-26 LAB — BODY FLUID CELL COUNT WITH DIFFERENTIAL
Lymphs, Fluid: 54 %
Monocyte-Macrophage-Serous Fluid: 8 % — ABNORMAL LOW (ref 50–90)
Neutrophil Count, Fluid: 38 % — ABNORMAL HIGH (ref 0–25)
Total Nucleated Cell Count, Fluid: 420 cu mm (ref 0–1000)

## 2021-01-26 LAB — LACTATE DEHYDROGENASE, PLEURAL OR PERITONEAL FLUID: LD, Fluid: 130 U/L — ABNORMAL HIGH (ref 3–23)

## 2021-01-26 LAB — CBC
HCT: 35.6 % — ABNORMAL LOW (ref 36.0–46.0)
Hemoglobin: 11.8 g/dL — ABNORMAL LOW (ref 12.0–15.0)
MCH: 29.3 pg (ref 26.0–34.0)
MCHC: 33.1 g/dL (ref 30.0–36.0)
MCV: 88.3 fL (ref 80.0–100.0)
Platelets: 331 10*3/uL (ref 150–400)
RBC: 4.03 MIL/uL (ref 3.87–5.11)
RDW: 14.5 % (ref 11.5–15.5)
WBC: 7.9 10*3/uL (ref 4.0–10.5)
nRBC: 0 % (ref 0.0–0.2)

## 2021-01-26 LAB — PROTEIN, PLEURAL OR PERITONEAL FLUID: Total protein, fluid: <3 g/dL

## 2021-01-26 LAB — AMYLASE, PLEURAL OR PERITONEAL FLUID: Amylase, Fluid: 7 U/L

## 2021-01-26 MED ORDER — METOPROLOL TARTRATE 25 MG PO TABS
25.0000 mg | ORAL_TABLET | Freq: Two times a day (BID) | ORAL | Status: DC
  Administered 2021-01-26 – 2021-01-27 (×3): 25 mg via ORAL
  Filled 2021-01-26 (×3): qty 1

## 2021-01-26 MED ORDER — HEPARIN (PORCINE) 25000 UT/250ML-% IV SOLN
1300.0000 [IU]/h | INTRAVENOUS | Status: DC
  Administered 2021-01-26 – 2021-01-27 (×3): 1400 [IU]/h via INTRAVENOUS
  Filled 2021-01-26 (×2): qty 250

## 2021-01-26 MED ORDER — SULFAMETHOXAZOLE-TRIMETHOPRIM 800-160 MG PO TABS
1.0000 | ORAL_TABLET | ORAL | Status: DC
  Administered 2021-01-28 – 2021-01-30 (×2): 1 via ORAL
  Filled 2021-01-26 (×2): qty 1

## 2021-01-26 NOTE — Progress Notes (Signed)
ANTICOAGULATION CONSULT NOTE - Follow Up Consult  Pharmacy Consult for heparin Indication: atrial fibrillation (Eliquis on hold)  Allergies  Allergen Reactions   Azithromycin Other (See Comments)    Pain in arm with IV infusion    Condrolite [Glucosamine-Chondroitin-Msm] Rash    Patient Measurements: Height: 5\' 2"  (157.5 cm) Weight: 85.5 kg (188 lb 8 oz) IBW/kg (Calculated) : 50.1 Heparin Dosing Weight: 69 kg  Vital Signs: Temp: 98 F (36.7 C) (09/03 0840) Temp Source: Oral (09/03 0840) BP: 129/62 (09/03 0840) Pulse Rate: 96 (09/03 0840)  Labs: Recent Labs    01/24/21 0251 01/24/21 1546 01/25/21 0219 01/26/21 0134  HGB 12.3  --  12.0 11.8*  HCT 37.9  --  37.6 35.6*  PLT 292  --  319 331  APTT 114* 59* 106*  --   HEPARINUNFRC 0.73*  --  0.58  --   CREATININE 0.59  --  0.60  --      Estimated Creatinine Clearance: 56.9 mL/min (by C-G formula based on SCr of 0.6 mg/dL).   Medications:  Infusions:   heparin      Assessment: 80 yo F on anticoagulation PTA for hx afib.  Oral anticoagulation has been held pending possible need for procedures.    Heparin was stopped 9/2 am for thoracentesis per CCM ok to resume heparin drip 9/3 Will aim for low end of goal  Goal of Therapy:  Heparin level 0.3-0.7 units/ml aPTT 66-102 seconds Monitor platelets by anticoagulation protocol: Yes   Plan:  Heparin drip 1400 uts/hr  Daily heparin level and CBC    11/3 Pharm.D. CPP, BCPS Clinical Pharmacist [phone] 01/26/2021 8:07 PM   **Pharmacist phone directory can now be found on [email or web address] ([email or web address]).  Listed under [facility].

## 2021-01-26 NOTE — Progress Notes (Signed)
Rebecca Short  PYP:[id number] DOB: 02-11-1941 DOA: 01/19/2021 PCP: Lawerance Cruel, MD    Brief Narrative:  80 year old with a history of permanent atrial fibrillation, obesity, and OSA who presented to the ED with severe ongoing generalized weakness and shortness of breath, suffering with blood pressures into the 80s at home.  She was admitted in July with atrial fibrillation.  She was admitted in August with pleural effusions and a pericardial effusion.  At that time 1 L of an exudative fluid was drained from her left pleural space.  She was discharged on colchicine for her pericardial effusion.  In the ED a CXR noted a similar moderate left pleural effusion and a small increase in an otherwise small right pleural effusion.  Cardiology, IR and pulmonology consulted.  S/p left diagnostic and therapeutic thoracentesis 1 L on 8/28.  Significant Events:  7/31 - 8/2 admit for afib w/ RVR and hypotension 8/11 - 8/19 admit for PNA w/ pericardial and pleural effusions  8/14 thoracentesis - 950cc drained c/w exudate  8/15 pericardiocentesis - 570cc drained  8/16 pericardial drain removed  8/27 admit via ED 8/28, thoracentesis in IR, 1 L of amber fluid drained 8/31 starting steroids 9/2: Left thoracentesis, 800 mL of amber fluid drained  Subjective: -Feels better overall, breathing is improving  Assessment & Plan:   Chronic atrial fibrillation with RVR Cardiology following.   -Was on metoprolol and Cardizem gtt., Cardizem weaned off, metoprolol dose increased -Apixaban switched to IV heparin due to need for thoracentesis, this was held yesterday, resume heparin this evening  Recurrent bilateral pleural effusions, left >R -On 8/14 had exudative fluid drained, around 1 L, treated for pneumonia as well then, CT without evidence of mass or other concerning findings  -On 8/28, 1 L of fluid drained again, consistent with exudate, cultures are negative -Appreciate pulmonary input, concern for  inflammatory etiology in the setting of high ESR, CRP and rheumatoid factor -Additional serologies ordered, started prednisone 40 mg daily on 8/31 -Repeat chest x-ray noted moderate to large bilateral effusions -Pulmonary following, underwent left thoracentesis yesterday 9/2, 800 mL drained -Plan for right thoracentesis today -QuantiFERON gold pending but low suspicion for TB.   -Completed 5-day course of cefepime -I think she will need diuretics  as well, may have a component of diastolic CHF which could also be contributing, reports long Standing history of ankle swelling in the evenings  Pericardial effusion/pericarditis -Underwent pericardiocentesis on 8/15 for large pericardial effusion, started on colchicine then  -Repeat echo with small pericardial effusion  -Continue colchicine, starting steroids, see discussion above   Tachycardia induced cardiomyopathy Suffered recent acute CHF episode felt to be due to tachycardia -was previously diagnosed and is usually on Lasix 40 mg daily  OSA on CPAP Continue usual CPAP dosing   DVT prophylaxis: Resume IV heparin this evening CODE STATUS: Full code Family Communication: Spouse at bedside yesterday status is: Inpatient  Remains inpatient appropriate because:Inpatient level of care appropriate due to severity of illness  Dispo: The patient is from: Home              Anticipated d/c is to: Home next week              Patient currently is not medically stable to d/c.   Difficult to place patient No  Consultants:  [facility] Cardiology Interventional radiology Pulmonology.  Antimicrobials:  Vancomycin 8/27 > 8/28 Cefepime 8/27 > 9/2  Procedures Ultrasound-guided left thoracentesis 8/28: 1 L drained Left thoracentesis -by  pulmonary 9/2: 800 mL drained   Objective:  Vitals:   01/25/21 1634 01/25/21 2108 01/26/21 0510 01/26/21 0840  BP: 129/79 125/73 133/83 129/62  Pulse: 92 94 78 96  Resp:   (!) 21 20  Temp:   98.2 F (36.8 C)  98 F (36.7 C)  TempSrc:   Oral Oral  SpO2:   95% 95%  Weight:   85.5 kg   Height:          Examination: General exam:  Pleasant elderly female sitting up in bed, AAOx3, no distress CVS: S1-S2, irregularly irregular rhythm Lungs: Few basilar rales on the left, decreased breath sounds in the right Abdomen: Soft, nontender, bowel sounds present Extremities: No edema  Skin: No rash on exposed skin    CBC: Recent Labs  Lab 01/19/21 1803 01/20/21 0313 01/24/21 0251 01/25/21 0219 01/26/21 0134  WBC 15.3*   < > 6.1 9.0 7.9  NEUTROABS 13.0*  --   --   --   --   HGB 14.8   < > 12.3 12.0 11.8*  HCT 45.2   < > 37.9 37.6 35.6*  MCV 89.3   < > 89.8 89.1 88.3  PLT 241   < > 292 319 331   < > = values in this interval not displayed.   Basic Metabolic Panel: Recent Labs  Lab 01/20/21 0313 01/21/21 0116 01/24/21 0251 01/25/21 0219 01/25/21 0816  NA 135 136 138 139  --   K 3.2* 3.9 4.2 4.2  --   CL 101 103 106 106  --   CO2 $Re'26 30 25 25  'aLB$ --   GLUCOSE 114* 126* 168* 119*  --   BUN $Re'15 18 17 23  'DwO$ --   CREATININE 0.84 0.74 0.59 0.60  --   CALCIUM 8.2* 8.1* 8.3* 8.7*  --   MG 1.9 1.9  --   --  2.0   GFR: Estimated Creatinine Clearance: 56.9 mL/min (by C-G formula based on SCr of 0.6 mg/dL).  Liver Function Tests: Recent Labs  Lab 01/19/21 1803  AST 26  ALT 31  ALKPHOS 88  BILITOT 1.1  PROT 6.0*  ALBUMIN 2.8*    HbA1C: Hgb A1c MFr Bld  Date/Time Value Ref Range Status  12/23/2020 10:12 PM 5.4 4.8 - 5.6 % Final    Comment:    (NOTE) Pre diabetes:          5.7%-6.4%  Diabetes:              >6.4%  Glycemic control for   <7.0% adults with diabetes     CBG: Recent Labs  Lab 01/19/21 1712  GLUCAP 122*    Recent Results (from the past 240 hour(s))  Blood culture (routine x 2)     Status: None   Collection Time: 01/19/21  5:17 PM   Specimen: BLOOD  Result Value Ref Range Status   Specimen Description BLOOD IV START  Final   Special Requests   Final     BOTTLES DRAWN AEROBIC AND ANAEROBIC Blood Culture results may not be optimal due to an inadequate volume of blood received in culture bottles   Culture   Final    NO GROWTH 5 DAYS Performed at [facility], [place] [address] [place],  [address]    Report Status 01/24/2021 FINAL  Final  Resp Panel by RT-PCR (Flu A&B, Covid) Nasopharyngeal Swab     Status: None   Collection Time: 01/19/21  6:03 PM   Specimen:  Nasopharyngeal Swab; Nasopharyngeal(NP) swabs in vial transport medium  Result Value Ref Range Status   SARS Coronavirus 2 by RT PCR NEGATIVE NEGATIVE Final    Comment: (NOTE) SARS-CoV-2 target nucleic acids are NOT DETECTED.  The SARS-CoV-2 RNA is generally detectable in upper respiratory specimens during the acute phase of infection. The lowest concentration of SARS-CoV-2 viral copies this assay can detect is 138 copies/mL. A negative result does not preclude SARS-Cov-2 infection and should not be used as the sole basis for treatment or other patient management decisions. A negative result may occur with  improper specimen collection/handling, submission of specimen other than nasopharyngeal swab, presence of viral mutation(s) within the areas targeted by this assay, and inadequate number of viral copies(<138 copies/mL). A negative result must be combined with clinical observations, patient history, and epidemiological information. The expected result is Negative.  Fact Sheet for Patients:  BloggerCourse.com  Fact Sheet for Healthcare Providers:  SeriousBroker.it  This test is no t yet approved or cleared by the [place] FDA and  has been authorized for detection and/or diagnosis of SARS-CoV-2 by FDA under an Emergency Use Authorization (EUA). This EUA will remain  in effect (meaning this test can be used) for the duration of the COVID-19 declaration under Section 564(b)(1) of the Act, 21 U.S.C.section  360bbb-3(b)(1), unless the authorization is terminated  or revoked sooner.       Influenza A by PCR NEGATIVE NEGATIVE Final   Influenza B by PCR NEGATIVE NEGATIVE Final    Comment: (NOTE) The Xpert Xpress SARS-CoV-2/FLU/RSV plus assay is intended as an aid in the diagnosis of influenza from Nasopharyngeal swab specimens and should not be used as a sole basis for treatment. Nasal washings and aspirates are unacceptable for Xpert Xpress SARS-CoV-2/FLU/RSV testing.  Fact Sheet for Patients: BloggerCourse.com  Fact Sheet for Healthcare Providers: SeriousBroker.it  This test is not yet approved or cleared by the [place] FDA and has been authorized for detection and/or diagnosis of SARS-CoV-2 by FDA under an Emergency Use Authorization (EUA). This EUA will remain in effect (meaning this test can be used) for the duration of the COVID-19 declaration under Section 564(b)(1) of the Act, 21 U.S.C. section 360bbb-3(b)(1), unless the authorization is terminated or revoked.  Performed at [facility], [address], [place], [place] [address]   Body fluid culture w Gram Stain     Status: None   Collection Time: 01/20/21 11:36 AM   Specimen: PATH Cytology Pleural fluid  Result Value Ref Range Status   Specimen Description PLEURAL FLUID  Final   Special Requests CYTO  Final   Gram Stain   Final    FEW WBC PRESENT,BOTH PMN AND MONONUCLEAR NO ORGANISMS SEEN    Culture   Final    NO GROWTH 3 DAYS Performed at [facility], [address] [place], [place] [address]    Report Status 01/23/2021 FINAL  Final  Body fluid culture w Gram Stain     Status: None (Preliminary result)   Collection Time: 01/25/21  1:57 PM   Specimen: Pleural Fluid  Result Value Ref Range Status   Specimen Description FLUID PLEURAL  Final   Special Requests NONE  Final   Gram Stain   Final    RARE WBC PRESENT, PREDOMINANTLY PMN NO ORGANISMS  SEEN    Culture   Final    NO GROWTH < 24 HOURS Performed at [facility], [address], [place], [place] [address]  Report Status PENDING  Incomplete     Scheduled Meds:  colchicine  0.6 mg Oral Daily   metoprolol tartrate  25 mg Oral BID   polyvinyl alcohol  1 drop Both Eyes BID   predniSONE  40 mg Oral Q breakfast   sodium chloride flush  3 mL Intravenous Q12H   [START ON 01/28/2021] sulfamethoxazole-trimethoprim  1 tablet Oral Once per day on Mon Wed Fri   Continuous Infusions:     LOS: 6 days   Domenic Polite, MD,

## 2021-01-26 NOTE — Progress Notes (Addendum)
Progress Note  Patient Name: Rebecca Short Date of Encounter: 01/26/2021  [facility] Cardiologist: [name], MD   Subjective   Denies any CP or SOB. Ambulated this morning to the bathroom for the 1st time without any issue.   Inpatient Medications    Scheduled Meds:  colchicine  0.6 mg Oral Daily   metoprolol tartrate  12.5 mg Oral QID   polyvinyl alcohol  1 drop Both Eyes BID   predniSONE  40 mg Oral Q breakfast   sodium chloride flush  3 mL Intravenous Q12H   Continuous Infusions:  PRN Meds: acetaminophen **OR** [DISCONTINUED] acetaminophen   Vital Signs    Vitals:   01/25/21 1634 01/25/21 2108 01/26/21 0510 01/26/21 0840  BP: 129/79 125/73 133/83 129/62  Pulse: 92 94 78 96  Resp:   (!) 21 20  Temp:   98.2 F (36.8 C) 98 F (36.7 C)  TempSrc:   Oral Oral  SpO2:   95% 95%  Weight:   85.5 kg   Height:        Intake/Output Summary (Last 24 hours) at 01/26/2021 0849 Last data filed at 01/25/2021 1700 Gross per 24 hour  Intake --  Output 300 ml  Net -300 ml   Last 3 Weights 01/26/2021 01/25/2021 01/24/2021  Weight (lbs) 188 lb 8 oz 185 lb 10 oz 183 lb 3.2 oz  Weight (kg) 85.503 kg 84.2 kg 83.1 kg      Telemetry    Atrial fibrillation, HR 60-80s overnight, increased to 90s this morning - Personally Reviewed  ECG    Atrial fibrillation with RVR - Personally Reviewed  Physical Exam   GEN: No acute distress.   Neck: No JVD Cardiac: irregularly irregular, no murmurs, rubs, or gallops.  Respiratory: diminished breath sound in the R base of lung GI: Soft, nontender, non-distended  MS: No edema; No deformity. Neuro:  Nonfocal  Psych: Normal affect   Labs    High Sensitivity Troponin:   Recent Labs  Lab 01/03/21 0951 01/03/21 1338  TROPONINIHS 7 5      Chemistry Recent Labs  Lab 01/19/21 1803 01/20/21 0313 01/21/21 0116 01/24/21 0251 01/25/21 0219  NA 134*   < > 136 138 139  K 3.2*   < > 3.9 4.2 4.2  CL 95*   < > 103 106 106  CO2  28   < > 30 25 25   GLUCOSE 118*   < > 126* 168* 119*  BUN 13   < > 18 17 23   CREATININE 0.81   < > 0.74 0.59 0.60  CALCIUM 8.4*   < > 8.1* 8.3* 8.7*  PROT 6.0*  --   --   --   --   ALBUMIN 2.8*  --   --   --   --   AST 26  --   --   --   --   ALT 31  --   --   --   --   ALKPHOS 88  --   --   --   --   BILITOT 1.1  --   --   --   --   GFRNONAA >60   < > >60 >60 >60  ANIONGAP 11   < > 3* 7 8   < > = values in this interval not displayed.     Hematology Recent Labs  Lab 01/24/21 0251 01/25/21 0219 01/26/21 0134  WBC 6.1 9.0 7.9  RBC 4.22 4.22 4.03  HGB 12.3 12.0 11.8*  HCT 37.9 37.6 35.6*  MCV 89.8 89.1 88.3  MCH 29.1 28.4 29.3  MCHC 32.5 31.9 33.1  RDW 14.4 14.5 14.5  PLT 292 319 331    BNP Recent Labs  Lab 01/19/21 1803 01/23/21 0553  BNP 102.3* 53.4     DDimer No results for input(s): DDIMER in the last 168 hours.   Radiology    DG Chest 2 View  Result Date: 01/24/2021 CLINICAL DATA:  Dyspnea R06.00 (ICD-10-CM) EXAM: CHEST - 2 VIEW COMPARISON:  01/22/2021. FINDINGS: Increased moderate to large left and moderate right pleural effusions. Mildly increased overlying opacities. No visible pneumothorax. No acute osseous abnormality. Cardiomediastinal silhouette is largely obscured. IMPRESSION: 1. Increased moderate to large left and moderate right pleural effusions. 2. Mildly increased overlying opacities, most likely atelectasis. Infection is not excluded. Electronically Signed   By: Feliberto Harts M.D.   On: 01/24/2021 13:08   DG CHEST PORT 1 VIEW  Result Date: 01/25/2021 CLINICAL DATA:  Post left thoracentesis EXAM: PORTABLE CHEST 1 VIEW COMPARISON:  01/24/2021 FINDINGS: Decreased left pleural effusion following thoracentesis. No pneumothorax. Small left pleural effusion remains. Improved aeration left lung base Moderate right pleural effusion right lower lobe atelectasis unchanged. IMPRESSION: Non complicated left thoracentesis. Electronically Signed   By: Marlan Palau M.D.   On: 01/25/2021 14:47    Cardiac Studies   Echo 01/20/2021 1. Left ventricular ejection fraction, by estimation, is 60 to 65%. The  left ventricle has normal function. The left ventricle has no regional  wall motion abnormalities.   2. A small pericardial effusion is present. There is no evidence of  cardiac tamponade.   3. The inferior vena cava is normal in size with greater than 50%  respiratory variability, suggesting right atrial pressure of 3 mmHg.   Comparison(s): No significant change from prior study.   Patient Profile     80 y.o. female with PMH of permanent atrial fibrillation, obesity, OSA on CPAP, recent admission for afib with RVR with hypotension 7/31-8/2, PNA with pericardial and pleural effusion requiring pericardiocentesis and thoracentesis 8/11-8/19, who presented with generalized weakness and SOB 01/19/2021. Now have recurrent bilateral pleural effusion, cytology negative. She is being treated with HCAP. Cardiology consulted for afib with RVR  Assessment & Plan    Permanent atrial fibrillation with RVR  - limited echo 01/20/2021 showed EF 60-65%  - rate controlled. IV diltiazem weaned off. Currently on 12.5 QID dosing of metoprolol tartrate. Will consolidate to 25mg  BID.  - Consider resume IV heparin once ok by pulmonology. once all of her invasive procedure have been completed, will think about restarting Eliquis  Pericardial effusion: pericardiocentesis on 01/07/2021  Pericarditis: denies any CP  Recurrent pleural effusion  - previously underwent pericardiocentesis 01/07/2021 and thoracentesis 01/06/2021  - left thoracentesis with removal of 1 L 01/20/2021  - underwent left thoracentesis again on 01/25/2021 with removal of 800 cc of amber appearing fluid.   - unclear if she requires R thoracentesis as well, will defer to pulmonology, recommend resume Eliquis if once all of her invasive procedure have been completed  Recurrent HCAP      For questions or  updates, please contact [facility] Please consult [email or web address] for contact info under        Signed, 03/27/2021, PA  01/26/2021, 8:49 AM    I have seen and examined the patient along with 03/28/2021, PA .  I have reviewed the chart, notes and new data.  I agree  with PA/NP's note.  Key new complaints: feeling better. Brief pleuritic pain when she yawned deeply, otherwise no pain. No dyspnea walking to bathroom Key examination changes: reduced breath sounds in both bases, about 1/3 up. No pericardial rub. Irregular rhythm, well rate coontrolled Key new findings / data: exudative fluid, no malignant cells on cytology, inflammatory markers are up, but immunological workup normal and CT chest without suspicious masses.  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 On steroids and colchicine, watching for rapid reaccummulation. If she fails to respond to antiinflammatory therapy consider pleural biopsy. Rate control and anticoagulation for permanent atrial fibrillation.    Thurmon Fair, MD, [facility] [phone] 01/26/2021, 11:21 AM

## 2021-01-26 NOTE — Progress Notes (Signed)
NAME:  Rebecca Short, MRN:  [id number], DOB:  10/18/1940, LOS: 6 ADMISSION DATE:  01/19/2021, CONSULTATION DATE:  01/21/21 REFERRING MD:  Dr. Waymon Amato, CHIEF COMPLAINT:  Recurrent dsypnea    History of Present Illness:  Rebecca Short is a 80 y.o. female with a PMH significant for permanent A-fib anticoagulated with Eliquis, obesity,  OSA on CPAP, and macular degeneration who presented to the ED with complaints of generalized weakness and shortness of breath. Of note patient was seen and treated at this facility 7/31 - 8/2 for A-fib RVR and hypotension. Subsequently admitted again 8/11-8/19 for PNA with pericardial and pleura effusions. She underwent thoracentesis 8/14 with of exudative fluid removed. On 8/15 she underwent pericardiocentesis with of fluid drained.   On ED arrival for this admission 8/27 patient was seen with tachypnea and mild tachycardia with all other vital stable. CXR was obtained and revealed moderate left pleural effusion and small right pleural effusion. IR was consulted and patient underwent left thoracentesis with 1L of amber colored fluid removed. Fluid continues to remain exudative by Lights Criteria. PCCM consulted for further assistance in management of pleural effusion. Cardiology consulted for management of pericardial effusion   Pertinent  Medical History  Permanent A-fib, obesity,  OSA on CPAP, and macular degeneration  Significant recent Events:  7/31 - 8/2 admit for afib w/ RVR and hypotension 8/11 - 8/19 admit for PNA w/ pericardial and pleural effusions 8/11 CT chest with moderate left and small right pleural effusions. Adjacent bibasilar and lingular atelectasis. Increased, moderate-sized pericardial effusion. Biatrial enlargement. Mild pulmonary edema. No masses  8/14 thoracentesis - 950cc drained c/w exudate  8/15 pericardiocentesis - 570cc drained  8/16 pericardial drain removed  8/27 admit via ED repeat thoracentesis per IR with 1L of amber  exudative fluid removed   Interim History / Subjective:  Breathing improved a bit after thora yesterday. Some L sided pleurisy overnight that self resolved. + cough, worse with deep inspiration Pulls ~800-900 on IS  Objective   Blood pressure 129/62, pulse 96, temperature 98 F (36.7 C), temperature source Oral, resp. rate 20, height 5\' 2"  (1.575 m), weight 85.5 kg, SpO2 95 %.        Intake/Output Summary (Last 24 hours) at 01/26/2021 1033 Last data filed at 01/25/2021 1700 Gross per 24 hour  Intake --  Output 300 ml  Net -300 ml     Filed Weights   01/24/21 0500 01/25/21 0354 01/26/21 0510  Weight: 83.1 kg 84.2 kg 85.5 kg    Examination: No distress on room air Lungs diminished bases, no accessory muscle use Ext trace edema Moves all 4 ext to command AOx3  Resolved Hospital Problem list     Assessment & Plan:  Recurrent bilateral, left greater than right, pleural effusions and Pericardial Effusion-  nonspecific elevated inflammatory markers (+ RA, ESR, CRP neg ANA/histone/), started empirically on steroids; pleura/pericardium drained.  No signs of infection, heart failure, proteinuria.  No obvious offending meds. 8/14 L tap was bloody and exudative/ neutrophil predominant because of this; others were lymphocyte predominant.  Hopefully this is all just inflammatory serositis but will need to show a response to steroids to prove. Permanent Afib on AC OSA on CPAP  - Drain R lung today - Fine to resume heparin gtt - Want to keep her a couple more days to make sure fluid does not re-accumulate quickly; if it does she will just get readmitted again; she also needs to show she can walk  with PT given OT comments on last transfer attempt yesterday - Will get CXR Monday and re-assess - Will start bactrim PJP ppx since may need long steroid taper  Erskine Emery MD PCCM

## 2021-01-27 DIAGNOSIS — I313 Pericardial effusion (noninflammatory): Secondary | ICD-10-CM | POA: Diagnosis not present

## 2021-01-27 DIAGNOSIS — J90 Pleural effusion, not elsewhere classified: Secondary | ICD-10-CM | POA: Diagnosis not present

## 2021-01-27 DIAGNOSIS — I4891 Unspecified atrial fibrillation: Secondary | ICD-10-CM | POA: Diagnosis not present

## 2021-01-27 LAB — BASIC METABOLIC PANEL
Anion gap: 4 — ABNORMAL LOW (ref 5–15)
BUN: 26 mg/dL — ABNORMAL HIGH (ref 8–23)
CO2: 26 mmol/L (ref 22–32)
Calcium: 8.3 mg/dL — ABNORMAL LOW (ref 8.9–10.3)
Chloride: 109 mmol/L (ref 98–111)
Creatinine, Ser: 0.59 mg/dL (ref 0.44–1.00)
GFR, Estimated: >60 mL/min (ref >60)
Glucose, Bld: 134 mg/dL — ABNORMAL HIGH (ref 70–99)
Potassium: 3.5 mmol/L (ref 3.5–5.1)
Sodium: 139 mmol/L (ref 135–145)

## 2021-01-27 LAB — CBC
HCT: 37 % (ref 36.0–46.0)
Hemoglobin: 11.9 g/dL — ABNORMAL LOW (ref 12.0–15.0)
MCH: 28.8 pg (ref 26.0–34.0)
MCHC: 32.2 g/dL (ref 30.0–36.0)
MCV: 89.6 fL (ref 80.0–100.0)
Platelets: 350 10*3/uL (ref 150–400)
RBC: 4.13 MIL/uL (ref 3.87–5.11)
RDW: 14.5 % (ref 11.5–15.5)
WBC: 7.6 10*3/uL (ref 4.0–10.5)
nRBC: 0 % (ref 0.0–0.2)

## 2021-01-27 LAB — CYCLIC CITRUL PEPTIDE ANTIBODY, IGG/IGA: CCP Antibodies IgG/IgA: 8 units (ref 0–19)

## 2021-01-27 LAB — TRIGLYCERIDES, BODY FLUIDS: Triglycerides, Fluid: 25 mg/dL

## 2021-01-27 LAB — HEPARIN LEVEL (UNFRACTIONATED)
Heparin Unfractionated: 0.3 IU/mL (ref 0.30–0.70)
Heparin Unfractionated: 0.35 IU/mL (ref 0.30–0.70)

## 2021-01-27 MED ORDER — FUROSEMIDE 40 MG PO TABS
40.0000 mg | ORAL_TABLET | Freq: Every day | ORAL | Status: DC
  Administered 2021-01-27 – 2021-01-29 (×3): 40 mg via ORAL
  Filled 2021-01-27 (×3): qty 1

## 2021-01-27 MED ORDER — METOPROLOL TARTRATE 25 MG PO TABS
37.5000 mg | ORAL_TABLET | Freq: Two times a day (BID) | ORAL | Status: DC
  Administered 2021-01-27 – 2021-01-30 (×6): 37.5 mg via ORAL
  Filled 2021-01-27 (×6): qty 1

## 2021-01-27 MED ORDER — POTASSIUM CHLORIDE 20 MEQ PO PACK: 20.0000 meq | PACK | Freq: Every day | ORAL | Status: DC

## 2021-01-27 MED ORDER — POTASSIUM CHLORIDE 20 MEQ PO PACK: 40.0000 meq | PACK | Freq: Two times a day (BID) | ORAL | Status: DC

## 2021-01-27 MED ORDER — POTASSIUM CHLORIDE 20 MEQ PO PACK
40.0000 meq | PACK | Freq: Every day | ORAL | Status: DC
  Administered 2021-01-27 – 2021-01-29 (×3): 40 meq via ORAL
  Filled 2021-01-27 (×3): qty 2

## 2021-01-27 MED ORDER — POTASSIUM CHLORIDE CRYS ER 20 MEQ PO TBCR
20.0000 meq | EXTENDED_RELEASE_TABLET | Freq: Every day | ORAL | Status: DC
  Filled 2021-01-27: qty 1

## 2021-01-27 NOTE — Progress Notes (Signed)
ANTICOAGULATION CONSULT NOTE - Follow Up Consult  Pharmacy Consult for heparin Indication: atrial fibrillation (Eliquis on hold)  Allergies  Allergen Reactions   Azithromycin Other (See Comments)    Pain in arm with IV infusion    Condrolite [Glucosamine-Chondroitin-Msm] Rash    Patient Measurements: Height: 5\' 2"  (157.5 cm) Weight: 85.5 kg (188 lb 8 oz) IBW/kg (Calculated) : 50.1 Heparin Dosing Weight: 69 kg  Vital Signs: Temp: 98.1 F (36.7 C) (09/03 2031) Temp Source: Oral (09/03 2031) BP: 116/86 (09/03 2031) Pulse Rate: 102 (09/03 2133)  Labs: Recent Labs    01/24/21 1546 01/25/21 0219 01/25/21 0219 01/26/21 0134 01/27/21 0140  HGB  --  12.0   < > 11.8* 11.9*  HCT  --  37.6  --  35.6* 37.0  PLT  --  319  --  331 350  APTT 59* 106*  --   --   --   HEPARINUNFRC  --  0.58  --   --  0.30  CREATININE  --  0.60  --   --  0.59   < > = values in this interval not displayed.     Estimated Creatinine Clearance: 56.9 mL/min (by C-G formula based on SCr of 0.59 mg/dL).   Medications:  Infusions:   heparin 1,400 Units/hr (01/27/21 0135)    Assessment: 80 yo F on anticoagulation PTA for hx afib.  Oral anticoagulation has been held pending possible need for procedures.    Heparin was stopped 9/2 am for thoracentesis per CCM ok to resume heparin drip 9/3 pm.   Heparin level therapeutic (0.3) on gtt at 1400 units/hr. No bleeding noted.   Goal of Therapy:  Heparin level 0.3-0.7 units/ml Monitor platelets by anticoagulation protocol: Yes   Plan:  Continue heparin drip a 1400 units/hr  F/u 6 hr heparin level to confirm remains therapeutic  11/3, PharmD, BCPS Please see amion for complete clinical pharmacist phone list 01/27/2021 3:14 AM

## 2021-01-27 NOTE — Progress Notes (Signed)
RT set pts home machine up and filled humidification chamber w/sterile water for pt. Pt able to place self on when ready. RT will continue to monitor.

## 2021-01-27 NOTE — Progress Notes (Addendum)
ANTICOAGULATION CONSULT NOTE - Follow Up Consult  Pharmacy Consult for heparin Indication: atrial fibrillation (Eliquis on hold)  Allergies  Allergen Reactions   Azithromycin Other (See Comments)    Pain in arm with IV infusion    Condrolite [Glucosamine-Chondroitin-Msm] Rash    Patient Measurements: Height: 5\' 2"  (157.5 cm) Weight: 81.4 kg (179 lb 6.4 oz) IBW/kg (Calculated) : 50.1 Heparin Dosing Weight: 69 kg  Vital Signs: Temp: 98.1 F (36.7 C) (09/04 0554) Temp Source: Oral (09/04 0554) BP: 152/88 (09/04 0554) Pulse Rate: 88 (09/04 0554)  Labs: Recent Labs    01/24/21 1546 01/25/21 0219 01/25/21 0219 01/26/21 0134 01/27/21 0140 01/27/21 0920  HGB  --  12.0   < > 11.8* 11.9*  --   HCT  --  37.6  --  35.6* 37.0  --   PLT  --  319  --  331 350  --   APTT 59* 106*  --   --   --   --   HEPARINUNFRC  --  0.58  --   --  0.30 0.35  CREATININE  --  0.60  --   --  0.59  --    < > = values in this interval not displayed.    Estimated Creatinine Clearance: 55.4 mL/min (by C-G formula based on SCr of 0.59 mg/dL).   Medications:  Infusions:   heparin 1,400 Units/hr (01/27/21 0336)    Assessment: 80 yo F on anticoagulation PTA for hx afib.  Oral anticoagulation has been held pending possible need for procedures.  Pharmacy has been consulted for heparin dosing.   Heparin was stopped 9/2 AM for thoracentesis, per CCM ok to resume heparin drip 9/3 PM.  Will aim for low end of goal.   ~6hr heparin level therapeutic at 0.35, on 1400 units/hr.  No line issues or signs/symptoms of bleeding per RN.  Goal of Therapy:  Heparin level 0.3-0.7 units/ml Monitor platelets by anticoagulation protocol: Yes   Plan:  Continue heparin at 1400 units/hr.  Daily heparin level, CBC.  Monitor for signs/symptoms of bleeding.    11/3, PharmD PGY1 Pharmacy Resident Phone [phone] 01/27/2021 11:39 AM   Please check AMION for all [facility] Pharm[facility] phone numbers After 10:00 PM, call  Main Pharmacy [phone]

## 2021-01-27 NOTE — Progress Notes (Signed)
Rebecca Short  YBW:[id number] DOB: May 14, 1941 DOA: 01/19/2021 PCP: Lawerance Cruel, MD    Brief Narrative:  80 year old with a history of permanent atrial fibrillation, obesity, and OSA who presented to the ED with severe ongoing generalized weakness and shortness of breath, suffering with blood pressures into the 80s at home.  She was admitted in July with atrial fibrillation.  She was admitted in August with pleural effusions and a pericardial effusion.  At that time 1 L of an exudative fluid was drained from her left pleural space.  She was discharged on colchicine for her pericardial effusion.  In the ED a CXR noted a similar moderate left pleural effusion and a small increase in an otherwise small right pleural effusion.  Cardiology, IR and pulmonology consulted.  S/p left diagnostic and therapeutic thoracentesis 1 L on 8/28.  Significant Events:  7/31 - 8/2 admit for afib w/ RVR and hypotension 8/11 - 8/19 admit for PNA w/ pericardial and pleural effusions  8/14 thoracentesis - 950cc drained c/w exudate  8/15 pericardiocentesis - 570cc drained  8/16 pericardial drain removed  8/27 admit via ED 8/28, thoracentesis in IR, 1 L of amber fluid drained 8/31 starting steroids 9/2: Left thoracentesis, 800 mL of amber fluid drained  Subjective: -Feels better overall, breathing is improving  Assessment & Plan:   Chronic atrial fibrillation with RVR Cardiology following.   -Was on metoprolol and Cardizem gtt., Cardizem weaned off, metoprolol dose increased -Apixaban switched to IV heparin due to need for thoracentesis, resumed -Switch back to apixaban tomorrow if no further procedures anticipated  Recurrent bilateral pleural effusions, left >R -On 8/14 had exudative fluid drained, around 1 L, treated for pneumonia as well then, CT without evidence of mass or other concerning findings  -On 8/28, 1 L of fluid drained again, consistent with exudate, cultures are negative -Appreciate  pulmonary input, concern for inflammatory etiology in the setting of high ESR, CRP and rheumatoid factor -Additional serologies ordered, started prednisone 40 mg daily on 8/31 -Repeat chest x-ray noted moderate to large bilateral effusions -Pulmonary following, underwent left thoracentesis yesterday 9/2, 800 mL drained -Plan for right thoracentesis today -QuantiFERON gold pending but low suspicion for TB.   -Completed 5-day course of cefepime -I think she will need diuretics  as well, may have a component of diastolic CHF which could also be contributing, reports long Standing history of ankle swelling in the evenings, will add Lasix 40 mg daily  Pericardial effusion/pericarditis -Underwent pericardiocentesis on 8/15 for large pericardial effusion, started on colchicine then  -Repeat echo with small pericardial effusion  -Continue colchicine, starting steroids, see discussion above   Tachycardia induced cardiomyopathy Suffered recent acute CHF episode felt to be due to tachycardia -was previously diagnosed and is usually on Lasix 40 mg daily  OSA on CPAP Continue usual CPAP dosing   DVT prophylaxis: IV heparin  CODE STATUS: Full code Family Communication: Spouse at bedside status is: Inpatient  Remains inpatient appropriate because:Inpatient level of care appropriate due to severity of illness  Dispo: The patient is from: Home              Anticipated d/c is to: Home next week              Patient currently is not medically stable to d/c.   Difficult to place patient No  Consultants:  [facility] Cardiology Interventional radiology Pulmonology.  Antimicrobials:  Vancomycin 8/27 > 8/28 Cefepime 8/27 > 9/2  Procedures 8/28 ultrasound-guided left thoracentesis:  1 L drained 9/2 left thoracentesis -by pulmonary : 800 mL drained 9/3 right thoracentesis   Objective:  Vitals:   01/26/21 0840 01/26/21 2031 01/26/21 2133 01/27/21 0554  BP: 129/62 116/86  (!) 152/88  Pulse: 96 93  (!) 102 88  Resp: 20 20 (!) 24 20  Temp: 98 F (36.7 C) 98.1 F (36.7 C)  98.1 F (36.7 C)  TempSrc: Oral Oral  Oral  SpO2: 95% 95% 96% 98%  Weight:    81.4 kg  Height:       Examination: General exam:  Pleasant elderly female sitting up in bed, AAOx3, no distress CVS: S1-S2, irregularly irregular rhythm Lungs: Few basilar rales bilaterally Abdomen: Soft, nontender, bowel sounds present Extremities: No edema  Skin: No rash on exposed skin    CBC: Recent Labs  Lab 01/25/21 0219 01/26/21 0134 01/27/21 0140  WBC 9.0 7.9 7.6  HGB 12.0 11.8* 11.9*  HCT 37.6 35.6* 37.0  MCV 89.1 88.3 89.6  PLT 319 331 453   Basic Metabolic Panel: Recent Labs  Lab 01/21/21 0116 01/24/21 0251 01/25/21 0219 01/25/21 0816 01/27/21 0140  NA 136 138 139  --  139  K 3.9 4.2 4.2  --  3.5  CL 103 106 106  --  109  CO2 _0 --  26  GLUCOSE 126* 168* 119*  --  134*  BUN _1 --  26*  CREATININE 0.74 0.59 0.60  --  0.59  CALCIUM 8.1* 8.3* 8.7*  --  8.3*  MG 1.9  --   --  2.0  --    GFR: Estimated Creatinine Clearance: 55.4 mL/min (by C-G formula based on SCr of 0.59 mg/dL).  Liver Function Tests: No results for input(s): AST, ALT, ALKPHOS, BILITOT, PROT, ALBUMIN in the last 168 hours.   HbA1C: Hgb A1c MFr Bld  Date/Time Value Ref Range Status  12/23/2020 10:12 PM 5.4 4.8 - 5.6 % Final    Comment:    (NOTE) Pre diabetes:          5.7%-6.4%  Diabetes:              >6.4%  Glycemic control for   <7.0% adults with diabetes     CBG: No results for input(s): GLUCAP in the last 168 hours.   Recent Results (from the past 240 hour(s))  Blood culture (routine x 2)     Status: None   Collection Time: 01/19/21  5:17 PM   Specimen: BLOOD  Result Value Ref Range Status   Specimen Description BLOOD IV START  Final   Special Requests   Final    BOTTLES DRAWN AEROBIC AND ANAEROBIC Blood Culture results may not be optimal due to an inadequate volume of blood received in  culture bottles   Culture   Final    NO GROWTH 5 DAYS Performed at [facility], [place] [address], [place], [place] [address]    Report Status 01/24/2021 FINAL  Final  Resp Panel by RT-PCR (Flu A&B, Covid) Nasopharyngeal Swab     Status: None   Collection Time: 01/19/21  6:03 PM   Specimen: Nasopharyngeal Swab; Nasopharyngeal(NP) swabs in vial transport medium  Result Value Ref Range Status   SARS Coronavirus 2 by RT PCR NEGATIVE NEGATIVE Final    Comment: (NOTE) SARS-CoV-2 target nucleic acids are NOT DETECTED.  The SARS-CoV-2 RNA is generally detectable in upper respiratory specimens during the acute phase of infection. The lowest concentration of SARS-CoV-2  viral copies this assay can detect is 138 copies/mL. A negative result does not preclude SARS-Cov-2 infection and should not be used as the sole basis for treatment or other patient management decisions. A negative result may occur with  improper specimen collection/handling, submission of specimen other than nasopharyngeal swab, presence of viral mutation(s) within the areas targeted by this assay, and inadequate number of viral copies(<138 copies/mL). A negative result must be combined with clinical observations, patient history, and epidemiological information. The expected result is Negative.  Fact Sheet for Patients:  EntrepreneurPulse.com.au  Fact Sheet for Healthcare Providers:  IncredibleEmployment.be  This test is no t yet approved or cleared by the [place] FDA and  has been authorized for detection and/or diagnosis of SARS-CoV-2 by FDA under an Emergency Use Authorization (EUA). This EUA will remain  in effect (meaning this test can be used) for the duration of the COVID-19 declaration under Section 564(b)(1) of the Act, 21 U.S.C.section 360bbb-3(b)(1), unless the authorization is terminated  or revoked sooner.       Influenza A by PCR NEGATIVE NEGATIVE Final    Influenza B by PCR NEGATIVE NEGATIVE Final    Comment: (NOTE) The Xpert Xpress SARS-CoV-2/FLU/RSV plus assay is intended as an aid in the diagnosis of influenza from Nasopharyngeal swab specimens and should not be used as a sole basis for treatment. Nasal washings and aspirates are unacceptable for Xpert Xpress SARS-CoV-2/FLU/RSV testing.  Fact Sheet for Patients: EntrepreneurPulse.com.au  Fact Sheet for Healthcare Providers: IncredibleEmployment.be  This test is not yet approved or cleared by the [place] FDA and has been authorized for detection and/or diagnosis of SARS-CoV-2 by FDA under an Emergency Use Authorization (EUA). This EUA will remain in effect (meaning this test can be used) for the duration of the COVID-19 declaration under Section 564(b)(1) of the Act, 21 U.S.C. section 360bbb-3(b)(1), unless the authorization is terminated or revoked.  Performed at [facility], [place] [address] [place], [place] [address]   Body fluid culture w Gram Stain     Status: None   Collection Time: 01/20/21 11:36 AM   Specimen: PATH Cytology Pleural fluid  Result Value Ref Range Status   Specimen Description PLEURAL FLUID  Final   Special Requests CYTO  Final   Gram Stain   Final    FEW WBC PRESENT,BOTH PMN AND MONONUCLEAR NO ORGANISMS SEEN    Culture   Final    NO GROWTH 3 DAYS Performed at [facility], [place] [address] [place], [place] [address]    Report Status 01/23/2021 FINAL  Final  Body fluid culture w Gram Stain     Status: None (Preliminary result)   Collection Time: 01/25/21  1:57 PM   Specimen: Pleural Fluid  Result Value Ref Range Status   Specimen Description FLUID PLEURAL  Final   Special Requests NONE  Final   Gram Stain   Final    RARE WBC PRESENT, PREDOMINANTLY PMN NO ORGANISMS SEEN    Culture   Final    NO GROWTH 2 DAYS Performed at [facility], [place] [address] [place], [place] [address]     Report Status PENDING  Incomplete  Body fluid culture w Gram Stain     Status: None (Preliminary result)   Collection Time: 01/26/21  9:53 AM   Specimen: Pleural Fluid  Result Value Ref Range Status   Specimen Description FLUID  Final   Special Requests NONE  Final   Gram Stain  Final    FEW WBC PRESENT, PREDOMINANTLY MONONUCLEAR NO ORGANISMS SEEN    Culture   Final    NO GROWTH < 24 HOURS Performed at [place] [address] [place], [place] [address]    Report Status PENDING  Incomplete     Scheduled Meds:  colchicine  0.6 mg Oral Daily   furosemide  40 mg Oral Daily   metoprolol tartrate  37.5 mg Oral BID   polyvinyl alcohol  1 drop Both Eyes BID   potassium chloride  20 mEq Oral Daily   predniSONE  40 mg Oral Q breakfast   sodium chloride flush  3 mL Intravenous Q12H   [START ON 01/28/2021] sulfamethoxazole-trimethoprim  1 tablet Oral Once per day on Mon Wed Fri   Continuous Infusions:  heparin 1,400 Units/hr (01/27/21 0336)      LOS: 7 days   Domenic Polite, MD,

## 2021-01-27 NOTE — Progress Notes (Signed)
Pt able to place herself on her home machine without assistance.

## 2021-01-27 NOTE — Progress Notes (Signed)
Progress Note  Patient Name: Rebecca Short Date of Encounter: 01/27/2021  [facility] Cardiologist: [name], MD   Subjective   Feeling overall better.  Inpatient Medications    Scheduled Meds:  colchicine  0.6 mg Oral Daily   metoprolol tartrate  37.5 mg Oral BID   polyvinyl alcohol  1 drop Both Eyes BID   predniSONE  40 mg Oral Q breakfast   sodium chloride flush  3 mL Intravenous Q12H   [START ON 01/28/2021] sulfamethoxazole-trimethoprim  1 tablet Oral Once per day on Mon Wed Fri   Continuous Infusions:  heparin 1,400 Units/hr (01/27/21 0336)   PRN Meds: acetaminophen **OR** [DISCONTINUED] acetaminophen   Vital Signs    Vitals:   01/26/21 0840 01/26/21 2031 01/26/21 2133 01/27/21 0554  BP: 129/62 116/86  (!) 152/88  Pulse: 96 93 (!) 102 88  Resp: 20 20 (!) 24 20  Temp: 98 F (36.7 C) 98.1 F (36.7 C)  98.1 F (36.7 C)  TempSrc: Oral Oral  Oral  SpO2: 95% 95% 96% 98%  Weight:    81.4 kg  Height:        Intake/Output Summary (Last 24 hours) at 01/27/2021 1044 Last data filed at 01/27/2021 0500 Gross per 24 hour  Intake 98.76 ml  Output 800 ml  Net -701.24 ml   Last 3 Weights 01/27/2021 01/26/2021 01/25/2021  Weight (lbs) 179 lb 6.4 oz 188 lb 8 oz 185 lb 10 oz  Weight (kg) 81.375 kg 85.503 kg 84.2 kg      Telemetry    AFib, mostly in the 80s, RVR with light activity - Personally Reviewed  ECG    No new tracing - Personally Reviewed  Physical Exam  Obese GEN: No acute distress.   Neck: No JVD Cardiac: irregular, no murmurs, rubs, or gallops.  Respiratory: Clear to auscultation bilaterally. GI: Soft, nontender, non-distended  MS: No edema; No deformity. Neuro:  Nonfocal  Psych: Normal affect   Labs    High Sensitivity Troponin:   Recent Labs  Lab 01/03/21 0951 01/03/21 1338  TROPONINIHS 7 5      Chemistry Recent Labs  Lab 01/24/21 0251 01/25/21 0219 01/27/21 0140  NA 138 139 139  K 4.2 4.2 3.5  CL 106 106 109  CO2 25 25 26    GLUCOSE 168* 119* 134*  BUN 17 23 26*  CREATININE 0.59 0.60 0.59  CALCIUM 8.3* 8.7* 8.3*  GFRNONAA >60 >60 >60  ANIONGAP 7 8 4*     Hematology Recent Labs  Lab 01/25/21 0219 01/26/21 0134 01/27/21 0140  WBC 9.0 7.9 7.6  RBC 4.22 4.03 4.13  HGB 12.0 11.8* 11.9*  HCT 37.6 35.6* 37.0  MCV 89.1 88.3 89.6  MCH 28.4 29.3 28.8  MCHC 31.9 33.1 32.2  RDW 14.5 14.5 14.5  PLT 319 331 350    BNP Recent Labs  Lab 01/23/21 0553  BNP 53.4     DDimer No results for input(s): DDIMER in the last 168 hours.   Radiology    DG Chest 1 View  Result Date: 01/26/2021 CLINICAL DATA:  Thoracentesis. EXAM: CHEST  1 VIEW COMPARISON:  Chest radiograph 01/25/2021. FINDINGS: Stable cardiac and mediastinal contours. Similar small left pleural effusion with underlying opacities. Slight interval decrease in size of small right pleural effusion. Persistent right lung base opacities. No pneumothorax. Thoracic spine degenerative changes. IMPRESSION: Persistent layering bilateral effusions and underlying atelectasis. No definite pneumothorax. Electronically Signed   By: 03/27/2021 M.D.   On:  01/26/2021 11:18   DG CHEST PORT 1 VIEW  Result Date: 01/25/2021 CLINICAL DATA:  Post left thoracentesis EXAM: PORTABLE CHEST 1 VIEW COMPARISON:  01/24/2021 FINDINGS: Decreased left pleural effusion following thoracentesis. No pneumothorax. Small left pleural effusion remains. Improved aeration left lung base Moderate right pleural effusion right lower lobe atelectasis unchanged. IMPRESSION: Non complicated left thoracentesis. Electronically Signed   By: Marlan Palau M.D.   On: 01/25/2021 14:47    Cardiac Studies   Echo 01/20/2021 1. Left ventricular ejection fraction, by estimation, is 60 to 65%. The  left ventricle has normal function. The left ventricle has no regional  wall motion abnormalities.   2. A small pericardial effusion is present. There is no evidence of  cardiac tamponade.   3. The inferior vena  cava is normal in size with greater than 50%  respiratory variability, suggesting right atrial pressure of 3 mmHg.   Comparison(s): No significant change from prior study.   Patient Profile     80 y.o. female with permanent atrial fibrillation, obesity, OSA on CPAP, recent admission for afib with RVR with hypotension 7/31-8/2, PNA with pericardial and pleural effusion requiring pericardiocentesis and thoracentesis 8/11-8/19, who presented with generalized weakness and SOB 01/19/2021. Now have recurrent bilateral exudative pleural effusions, cytology negative.   Assessment & Plan    Permanent AFib w RVR: rates faster than her usual, maybe due to inflammation and steroids. will increase metoprolol dose slightly. May be able to return to her usual dose of 25 mg BID as inflammatory state resolves. Recurrent/persistent pleuropericarditis: inflammatory, unclear etiology (idiopathic). On steroids and colchicine. If this fails to lead to resolution, consider pleural biopsy.     For questions or updates, please contact [facility] Please consult [email or web address] for contact info under        Signed, Thurmon Fair, MD  01/27/2021, 10:44 AM

## 2021-01-28 ENCOUNTER — Encounter (HOSPITAL_COMMUNITY): Payer: Self-pay | Admitting: Pulmonary Disease

## 2021-01-28 ENCOUNTER — Inpatient Hospital Stay (HOSPITAL_COMMUNITY): Payer: Medicare Other

## 2021-01-28 ENCOUNTER — Telehealth: Payer: Self-pay | Admitting: Acute Care

## 2021-01-28 DIAGNOSIS — I4891 Unspecified atrial fibrillation: Secondary | ICD-10-CM | POA: Diagnosis not present

## 2021-01-28 DIAGNOSIS — Z7901 Long term (current) use of anticoagulants: Secondary | ICD-10-CM | POA: Diagnosis not present

## 2021-01-28 DIAGNOSIS — Z9889 Other specified postprocedural states: Secondary | ICD-10-CM | POA: Diagnosis not present

## 2021-01-28 DIAGNOSIS — I313 Pericardial effusion (noninflammatory): Secondary | ICD-10-CM | POA: Diagnosis not present

## 2021-01-28 DIAGNOSIS — J90 Pleural effusion, not elsewhere classified: Secondary | ICD-10-CM | POA: Diagnosis not present

## 2021-01-28 LAB — BASIC METABOLIC PANEL
Anion gap: 6 (ref 5–15)
BUN: 24 mg/dL — ABNORMAL HIGH (ref 8–23)
CO2: 26 mmol/L (ref 22–32)
Calcium: 8.3 mg/dL — ABNORMAL LOW (ref 8.9–10.3)
Chloride: 105 mmol/L (ref 98–111)
Creatinine, Ser: 0.65 mg/dL (ref 0.44–1.00)
GFR, Estimated: >60 mL/min (ref >60)
Glucose, Bld: 120 mg/dL — ABNORMAL HIGH (ref 70–99)
Potassium: 3.7 mmol/L (ref 3.5–5.1)
Sodium: 137 mmol/L (ref 135–145)

## 2021-01-28 LAB — HEPARIN LEVEL (UNFRACTIONATED)
Heparin Unfractionated: 0.71 IU/mL — ABNORMAL HIGH (ref 0.30–0.70)
Heparin Unfractionated: 0.76 IU/mL — ABNORMAL HIGH (ref 0.30–0.70)

## 2021-01-28 LAB — CBC
HCT: 36.9 % (ref 36.0–46.0)
Hemoglobin: 12.1 g/dL (ref 12.0–15.0)
MCH: 29 pg (ref 26.0–34.0)
MCHC: 32.8 g/dL (ref 30.0–36.0)
MCV: 88.5 fL (ref 80.0–100.0)
Platelets: 341 10*3/uL (ref 150–400)
RBC: 4.17 MIL/uL (ref 3.87–5.11)
RDW: 14.6 % (ref 11.5–15.5)
WBC: 9.6 10*3/uL (ref 4.0–10.5)
nRBC: 0 % (ref 0.0–0.2)

## 2021-01-28 MED ORDER — PANTOPRAZOLE SODIUM 40 MG PO TBEC
40.0000 mg | DELAYED_RELEASE_TABLET | Freq: Every day | ORAL | Status: DC
  Administered 2021-01-28 – 2021-01-30 (×3): 40 mg via ORAL
  Filled 2021-01-28 (×3): qty 1

## 2021-01-28 MED ORDER — APIXABAN 5 MG PO TABS
5.0000 mg | ORAL_TABLET | Freq: Two times a day (BID) | ORAL | Status: DC
  Administered 2021-01-28 – 2021-01-30 (×5): 5 mg via ORAL
  Filled 2021-01-28 (×5): qty 1

## 2021-01-28 NOTE — Progress Notes (Signed)
01/28/2021  I have seen and evaluated the patient for likely inflammatory serositis.  S:  No events, feeling better.  Wants another day or two to make sure her breathing continues to improve.  Getting 1000-1250 on IS.  O: Blood pressure 120/79, pulse 90, temperature 97.8 F (36.6 C), temperature source Oral, resp. rate 17, height 5\' 2"  (1.575 m), weight 81.6 kg, SpO2 96 %.  No distress Lungs diminished at bases Ext warm, no edema Moves all 4 ext to command Does have some global weakness from being here so long  A:  Presumed inflammatory serositis with pleural/pericardial involvement post pericardial tap, pleural tap x 4.  Muscular deconditioning.  P:  - Would give 30 days 40mg  prednisone, we can start tapering once we see her in office, preferably within 2 weeks of DC - Bactrim pjp ppx - Okay to switch to NoAC - Available PRN, hopefully home in next day or two provided she is able to transfer  MD [facility] Prefer epic messenger for cross cover needs If after hours, please call E-link

## 2021-01-28 NOTE — Progress Notes (Signed)
Physical Therapy Treatment Patient Details Name: Rebecca Short MRN: [id number] DOB: 05/25/1941 Today's Date: 01/28/2021    History of Present Illness Pt is an 80yo female presenting to [facility] ED on 8/27 with increasing weakness and SOB. Recently discharged after bilateral pleural/pericardial effusions and returned  secondary to continued symptoms.  Thoracocentsis performed 8/28 with 1L drained. S/p throacentesis performed again 01/25/21 ith 800 cc's drained from L pleural space. PMH: afib with RVR, OSA, leukocytosis, macular degeneration.    PT Comments    Pt is displaying good progress with mobility, ambulating up to ~145 ft with a RW and supervision without LOB. Her HR primarily remained in 100-110s with gait but would increase intermittently for brief periods up to as high as 150 when walking while ambulating. Educated pt to pause and breathe between sentences to reduce risk of HR increasing with success noted. Pt needs further education on transfers as she displays a posterior bias during transfer to stand. Will continue to follow acutely. Current recommendations remain appropriate.    Follow Up Recommendations  Home health PT;Supervision/Assistance - 24 hour (Continue HHPT at d/c.)     Equipment Recommendations  None recommended by PT (Pt has required DME.)    Recommendations for Other Services       Precautions / Restrictions Precautions Precautions: Fall Precaution Comments: Monitor HR and O2 Restrictions Weight Bearing Restrictions: No    Mobility  Bed Mobility Overal bed mobility: Needs Assistance Bed Mobility: Supine to Sit     Supine to sit: Supervision;HOB elevated     General bed mobility comments: Pt able to come to sit wqith supervision with HOB elevated.    Transfers Overall transfer level: Needs assistance Equipment used: Rolling walker (2 wheeled) Transfers: Sit to/from Stand Sit to Stand: Supervision         General transfer comment: Extra time with pt  initiating scoot to EOB without cues. Needs cues for hand placement. x3 mini pushes and with rocking of trunk before successful in coming to stand, displaying posterior pushing of body with legs with each resulting in slight instability and posterior bias with transfer. Cued pt to place feet more posteriorly under her to reduce this in future reps.  Ambulation/Gait Ambulation/Gait assistance: Supervision Gait Distance (Feet): 145 Feet Assistive device: Rolling walker (2 wheeled) Gait Pattern/deviations: Step-through pattern;Decreased stride length;Trunk flexed Gait velocity: Decreased Gait velocity interpretation: <1.31 ft/sec, indicative of household ambulator General Gait Details: Supervision for safety, no LOB. Pt ambulates at slow pace and trunk flexed, needing cues intermittently to correct posture and keep proximal to RW, momentary success noted. Pt's HR would increase from 100-110s to 140-150 when talking whiole ambulating, needing cues to pause between sentences to catch breath and reduce exertion, success with keeping HR 100-120s.   Stairs             Wheelchair Mobility    Modified Rankin (Stroke Patients Only)       Balance Overall balance assessment: Needs assistance Sitting-balance support: No upper extremity supported;Feet supported Sitting balance-Leahy Scale: Good     Standing balance support: Bilateral upper extremity supported;During functional activity Standing balance-Leahy Scale: Poor Standing balance comment: Pt reliant on BUE on RW                            Cognition Arousal/Alertness: Awake/alert Behavior During Therapy: WFL for tasks assessed/performed Overall Cognitive Status: Within Functional Limits for tasks assessed  Exercises      General Comments General comments (skin integrity, edema, etc.): HR high 80s at rest, 100-110s majority of time ambulating, intermittent  increase to 120s and up to 150 when talking while ambulating, cuing to pause between sentences to decrease it with success.      Pertinent Vitals/Pain Pain Assessment: Faces Faces Pain Scale: No hurt Pain Intervention(s): Monitored during session    Home Living                      Prior Function            PT Goals (current goals can now be found in the care plan section) Acute Rehab PT Goals Patient Stated Goal: to be able to walk without HR increasing significantly PT Goal Formulation: With patient/family Time For Goal Achievement: 02/07/21 Potential to Achieve Goals: Good Progress towards PT goals: Progressing toward goals    Frequency    Min 3X/week      PT Plan Current plan remains appropriate    Co-evaluation              AM-PAC PT "6 Clicks" Mobility   Outcome Measure  Help needed turning from your back to your side while in a flat bed without using bedrails?: A Little Help needed moving from lying on your back to sitting on the side of a flat bed without using bedrails?: A Little Help needed moving to and from a bed to a chair (including a wheelchair)?: A Little Help needed standing up from a chair using your arms (e.g., wheelchair or bedside chair)?: A Little Help needed to walk in hospital room?: A Little Help needed climbing 3-5 steps with a railing? : A Lot 6 Click Score: 17    End of Session Equipment Utilized During Treatment: Gait belt Activity Tolerance: Patient tolerated treatment well Patient left: in chair;with call bell/phone within reach;with family/visitor present   PT Visit Diagnosis: Unsteadiness on feet (R26.81);History of falling (Z91.81);Muscle weakness (generalized) (M62.81);Other abnormalities of gait and mobility (R26.89);Difficulty in walking, not elsewhere classified (R26.2)     Time: 1062-6948 PT Time Calculation (min) (ACUTE ONLY): 23 min  Charges:  $Gait Training: 8-22 mins $Therapeutic Activity: 8-22  mins                     Raymond Gurney, PT, DPT Acute Rehabilitation Services  Pager: [phone] Office: [phone]    Rebecca Short 01/28/2021, 9:48 AM

## 2021-01-28 NOTE — Progress Notes (Signed)
NAME:  Rebecca Short, MRN:  [id number], DOB:  1941/03/02, LOS: 8 ADMISSION DATE:  01/19/2021, CONSULTATION DATE:  01/21/21 REFERRING MD:  Dr. Algis Liming, CHIEF COMPLAINT:  Recurrent dsypnea    History of Present Illness:  Rebecca Short is a 80 y.o. female with a PMH significant for permanent A-fib anticoagulated with Eliquis, obesity,  OSA on CPAP, and macular degeneration who presented to the ED with complaints of generalized weakness and shortness of breath. Of note patient was seen and treated at this facility 7/31 - 8/2 for A-fib RVR and hypotension. Subsequently admitted again 8/11-8/19 for PNA with pericardial and pleura effusions. She underwent thoracentesis 8/14 with 965ml of exudative fluid removed. On 8/15 she underwent pericardiocentesis with 580ml of fluid drained.   On ED arrival for this admission 8/27 patient was seen with tachypnea and mild tachycardia with all other vital stable. CXR was obtained and revealed moderate left pleural effusion and small right pleural effusion. IR was consulted and patient underwent left thoracentesis with 1L of amber colored fluid removed. Fluid continues to remain exudative by Lights Criteria. PCCM consulted for further assistance in management of pleural effusion. Cardiology consulted for management of pericardial effusion   Pertinent  Medical History  Permanent A-fib, obesity,  OSA on CPAP, and macular degeneration  Significant recent Events:  7/31 - 8/2 admit for afib w/ RVR and hypotension 8/11 - 8/19 admit for PNA w/ pericardial and pleural effusions 8/11 CT chest with moderate left and small right pleural effusions. Adjacent bibasilar and lingular atelectasis. Increased, moderate-sized pericardial effusion. Biatrial enlargement. Mild pulmonary edema. No masses  8/14 thoracentesis - 950cc drained c/w exudate  8/15 pericardiocentesis - 570cc drained  8/16 pericardial drain removed  8/27 admit via ED repeat thoracentesis per IR with 1L of amber  exudative fluid removed  Thoracentesis 9/3  9/5 CXR>> Small stable pleural effusions , atelectasis  Interim History / Subjective:  Remains on RA with sats of 97%, and no respiratory distress. Using her incentive spirometer up to 1250 and slightly higher when she relaxes.  States she has no shortness of breath Objective   Blood pressure 140/83, pulse 79, temperature 97.8 F (36.6 C), temperature source Oral, resp. rate 17, height $RemoveBe'5\' 2"'TTFhDaObk$  (1.575 m), weight 81.6 kg, SpO2 96 %.        Intake/Output Summary (Last 24 hours) at 01/28/2021 0846 Last data filed at 01/28/2021 0547 Gross per 24 hour  Intake 340.25 ml  Output 1450 ml  Net -1109.75 ml    Filed Weights   01/26/21 0510 01/27/21 0554 01/28/21 0545  Weight: 85.5 kg 81.4 kg 81.6 kg    Examination: No distress on room air, sats are 97% Lungs diminished bases, no accessory muscle use Scant trace edema Moves all 4 ext to command, following commands AOx3  CXR 9/5 with small stable pleural effusions ,  bibasilar hazy lung opacities, vs atelectasis. Afebrile, WBC 9.6 K 3.7, stable creatinine   Resolved Hospital Problem list     Assessment & Plan:  Recurrent bilateral, left greater than right, pleural effusions and Pericardial Effusion-  nonspecific elevated inflammatory markers (+ RA, ESR, CRP neg ANA/histone/), started empirically on steroids; pleura/pericardium drained.  No signs of infection, heart failure, proteinuria.  No obvious offending meds. 8/14 L tap was bloody and exudative/ neutrophil predominant because of this; others were lymphocyte predominant.  Hopefully this is all just inflammatory serositis but will need to show a response to steroids to prove. Permanent Afib on AC OSA on CPAP  -  Right Thoracentesis 9/3>> CXR stable with no re accumulation of fluid.  - Fine to resume Eliquis - CXR stable and weaned to RA, no distress,  she also needs to show she can walk with PT successfully ( INcluding transfers) - Trend CXR  prn - Will start bactrim PJP ppx since may need long steroid taper - Wants to follow up as OP for hospital follow up with Pulmonary and Cardiology.  - Upon discharge please place ambulatory referral to Pulmonary for Hospital  Follow up with Dr. Erin Fulling.   PCCM will sign off. Please let us know if we can be of any further assistance   Magdalen Spatz, MSN, AGACNP-BC [place] for personal pager PCCM on call pager [phone]  01/28/2021 9:12 AM

## 2021-01-28 NOTE — Progress Notes (Signed)
ANTICOAGULATION CONSULT NOTE - Follow Up Consult  Pharmacy Consult for heparin Indication: atrial fibrillation  Labs: Recent Labs    01/25/21 0219 01/26/21 0134 01/27/21 0140 01/27/21 0920 01/28/21 0107  HGB 12.0 11.8* 11.9*  --  12.1  HCT 37.6 35.6* 37.0  --  36.9  PLT 319 331 350  --  341  APTT 106*  --   --   --   --   HEPARINUNFRC 0.58  --  0.30 0.35 0.71*  CREATININE 0.60  --  0.59  --  0.65    Assessment: 79yo female slightly supratherapeutic on heparin after two levels at lower end of goal; no infusion issues or signs of bleeding per RN.  Goal of Therapy:  Heparin level 0.3-0.7 units/ml   Plan:  Will decrease heparin infusion by 1 unit/kg/hr to 1300 units/hr and check level in 8 hours.    Vernard Gambles, PharmD, BCPS  01/28/2021,2:15 AM

## 2021-01-28 NOTE — Progress Notes (Signed)
Rebecca Short  JSH:[id number] DOB: 08-17-1940 DOA: 01/19/2021 PCP: Lawerance Cruel, MD    Brief Narrative:  80 year old with a history of permanent atrial fibrillation, obesity, and OSA who presented to the ED with severe ongoing generalized weakness and shortness of breath, suffering with blood pressures into the 80s at home.  She was admitted in July with atrial fibrillation.  She was admitted in August with pleural effusions and a pericardial effusion.  At that time 1 L of an exudative fluid was drained from her left pleural space.  She was discharged on colchicine for her pericardial effusion.  In the ED a CXR noted a similar moderate left pleural effusion and a small increase in an otherwise small right pleural effusion.  Cardiology, IR and pulmonology consulted.  S/p left diagnostic and therapeutic thoracentesis 1 L on 8/28.  Significant Events:  7/31 - 8/2 admit for afib w/ RVR and hypotension 8/11 - 8/19 admit for PNA w/ pericardial and pleural effusions  8/14 thoracentesis - 950cc drained c/w exudate  8/15 pericardiocentesis - 570cc drained  8/16 pericardial drain removed  8/27 admit via ED 8/28, thoracentesis in IR, 1 L of amber fluid drained 8/31 starting steroids 9/2: Left thoracentesis, 800 mL of amber fluid drained 9/3: Right thoracentesis  Subjective: -Feels better overall, breathing is improving  Assessment & Plan:   Chronic atrial fibrillation with RVR Cardiology following.   -Was on metoprolol and Cardizem gtt., Cardizem weaned off, metoprolol dose increased -Apixaban switched to IV heparin due to need for thoracentesis -Switch back to apixaban today  Recurrent bilateral pleural effusions, left >R -On 8/14 had exudative fluid drained, around 1 L, treated for pneumonia as well then, CT without evidence of mass or other concerning findings  -On 8/28, 1 L of fluid drained again, consistent with exudate, cultures are negative -Appreciate pulmonary input, concern for  inflammatory etiology in the setting of high ESR, CRP and rheumatoid factor -Additional serologies ordered, started prednisone 40 mg daily on 8/31 -Repeat chest x-ray noted moderate to large bilateral effusions -Pulmonary following, underwent left thoracentesis 9/2, 800 mL drained -Underwent right thoracentesis on 9/3 -Recommends to continue current dose of prednisone for about a month after which she can be tapered -QuantiFERON gold pending but low suspicion for TB.   -Completed 5-day course of cefepime -I think she will need diuretics  as well, may have a component of diastolic CHF which could also be contributing, reports long Standing history of ankle swelling in the evenings, started on Lasix 40 mg daily  Pericardial effusion/pericarditis -Underwent pericardiocentesis on 8/15 for large pericardial effusion, started on colchicine then  -Repeat echo with small pericardial effusion  -Continue colchicine, starting steroids, see discussion above   Tachycardia induced cardiomyopathy Suffered recent acute CHF episode felt to be due to tachycardia -was previously diagnosed and is usually on Lasix 40 mg daily  OSA on CPAP Continue usual CPAP dosing   DVT prophylaxis: Switch back to apixaban CODE STATUS: Full code Family Communication: Spouse at bedside status is: Inpatient  Remains inpatient appropriate because:Inpatient level of care appropriate due to severity of illness  Dispo: The patient is from: Home              Anticipated d/c is to: Home next week              Patient currently is not medically stable to d/c.   Difficult to place patient No  Consultants:  [facility] Cardiology Interventional radiology Pulmonology.  Antimicrobials:  Vancomycin 8/27 > 8/28 Cefepime 8/27 > 9/2  Procedures 8/28 ultrasound-guided left thoracentesis: 1 L drained 9/2 left thoracentesis -by pulmonary : 800 mL drained 9/3 right thoracentesis   Objective:  Vitals:   01/27/21 0830 01/27/21  1948 01/28/21 0545 01/28/21 0849  BP:  123/85 140/83 120/79  Pulse:  85 79 90  Resp: _0 Temp:  98 F (36.7 C) 97.8 F (36.6 C)   TempSrc:  Oral Oral   SpO2: 98% 95% 96%   Weight:   81.6 kg   Height:       Examination: General exam:  Obese pleasant elderly female sitting up in bed, AAOx3, no distress CVS: S1-S2, irregularly irregular rhythm Lungs: Few basilar rales otherwise clear Abdomen: Soft, nontender, bowel sounds present Extremities: No edema  Skin: No rash on exposed skin    CBC: Recent Labs  Lab 01/26/21 0134 01/27/21 0140 01/28/21 0107  WBC 7.9 7.6 9.6  HGB 11.8* 11.9* 12.1  HCT 35.6* 37.0 36.9  MCV 88.3 89.6 88.5  PLT 331 350 557   Basic Metabolic Panel: Recent Labs  Lab 01/25/21 0219 01/25/21 0816 01/27/21 0140 01/28/21 0107  NA 139  --  139 137  K 4.2  --  3.5 3.7  CL 106  --  109 105  CO2 25  --  26 26  GLUCOSE 119*  --  134* 120*  BUN 23  --  26* 24*  CREATININE 0.60  --  0.59 0.65  CALCIUM 8.7*  --  8.3* 8.3*  MG  --  2.0  --   --    GFR: Estimated Creatinine Clearance: 55.5 mL/min (by C-G formula based on SCr of 0.65 mg/dL).  Liver Function Tests: No results for input(s): AST, ALT, ALKPHOS, BILITOT, PROT, ALBUMIN in the last 168 hours.   HbA1C: Hgb A1c MFr Bld  Date/Time Value Ref Range Status  12/23/2020 10:12 PM 5.4 4.8 - 5.6 % Final    Comment:    (NOTE) Pre diabetes:          5.7%-6.4%  Diabetes:              >6.4%  Glycemic control for   <7.0% adults with diabetes     CBG: No results for input(s): GLUCAP in the last 168 hours.   Recent Results (from the past 240 hour(s))  Blood culture (routine x 2)     Status: None   Collection Time: 01/19/21  5:17 PM   Specimen: BLOOD  Result Value Ref Range Status   Specimen Description BLOOD IV START  Final   Special Requests   Final    BOTTLES DRAWN AEROBIC AND ANAEROBIC Blood Culture results may not be optimal due to an inadequate volume of blood received in culture  bottles   Culture   Final    NO GROWTH 5 DAYS Performed at [facility], [place] [address], [place], [place] [address]    Report Status 01/24/2021 FINAL  Final  Resp Panel by RT-PCR (Flu A&B, Covid) Nasopharyngeal Swab     Status: None   Collection Time: 01/19/21  6:03 PM   Specimen: Nasopharyngeal Swab; Nasopharyngeal(NP) swabs in vial transport medium  Result Value Ref Range Status   SARS Coronavirus 2 by RT PCR NEGATIVE NEGATIVE Final    Comment: (NOTE) SARS-CoV-2 target nucleic acids are NOT DETECTED.  The SARS-CoV-2 RNA is generally detectable in upper respiratory specimens during the acute phase of infection. The lowest concentration of SARS-CoV-2 viral  copies this assay can detect is 138 copies/mL. A negative result does not preclude SARS-Cov-2 infection and should not be used as the sole basis for treatment or other patient management decisions. A negative result may occur with  improper specimen collection/handling, submission of specimen other than nasopharyngeal swab, presence of viral mutation(s) within the areas targeted by this assay, and inadequate number of viral copies(<138 copies/mL). A negative result must be combined with clinical observations, patient history, and epidemiological information. The expected result is Negative.  Fact Sheet for Patients:  EntrepreneurPulse.com.au  Fact Sheet for Healthcare Providers:  IncredibleEmployment.be  This test is no t yet approved or cleared by the [place] FDA and  has been authorized for detection and/or diagnosis of SARS-CoV-2 by FDA under an Emergency Use Authorization (EUA). This EUA will remain  in effect (meaning this test can be used) for the duration of the COVID-19 declaration under Section 564(b)(1) of the Act, 21 U.S.C.section 360bbb-3(b)(1), unless the authorization is terminated  or revoked sooner.       Influenza A by PCR NEGATIVE NEGATIVE Final   Influenza  B by PCR NEGATIVE NEGATIVE Final    Comment: (NOTE) The Xpert Xpress SARS-CoV-2/FLU/RSV plus assay is intended as an aid in the diagnosis of influenza from Nasopharyngeal swab specimens and should not be used as a sole basis for treatment. Nasal washings and aspirates are unacceptable for Xpert Xpress SARS-CoV-2/FLU/RSV testing.  Fact Sheet for Patients: EntrepreneurPulse.com.au  Fact Sheet for Healthcare Providers: IncredibleEmployment.be  This test is not yet approved or cleared by the [place] FDA and has been authorized for detection and/or diagnosis of SARS-CoV-2 by FDA under an Emergency Use Authorization (EUA). This EUA will remain in effect (meaning this test can be used) for the duration of the COVID-19 declaration under Section 564(b)(1) of the Act, 21 U.S.C. section 360bbb-3(b)(1), unless the authorization is terminated or revoked.  Performed at [facility], [place] [address], [place], [place] [address]   Body fluid culture w Gram Stain     Status: None   Collection Time: 01/20/21 11:36 AM   Specimen: PATH Cytology Pleural fluid  Result Value Ref Range Status   Specimen Description PLEURAL FLUID  Final   Special Requests CYTO  Final   Gram Stain   Final    FEW WBC PRESENT,BOTH PMN AND MONONUCLEAR NO ORGANISMS SEEN    Culture   Final    NO GROWTH 3 DAYS Performed at [facility], [place] [address], [place], [place] [address]    Report Status 01/23/2021 FINAL  Final  Body fluid culture w Gram Stain     Status: None (Preliminary result)   Collection Time: 01/25/21  1:57 PM   Specimen: Pleural Fluid  Result Value Ref Range Status   Specimen Description FLUID PLEURAL  Final   Special Requests NONE  Final   Gram Stain   Final    RARE WBC PRESENT, PREDOMINANTLY PMN NO ORGANISMS SEEN    Culture   Final    NO GROWTH 3 DAYS Performed at [facility], [place] [address] [place], [place]  [address]    Report Status  PENDING  Incomplete  Body fluid culture w Gram Stain     Status: None (Preliminary result)   Collection Time: 01/26/21  9:53 AM   Specimen: Pleural Fluid  Result Value Ref Range Status   Specimen Description FLUID  Final   Special Requests NONE  Final   Gram Stain   Final  FEW WBC PRESENT, PREDOMINANTLY MONONUCLEAR NO ORGANISMS SEEN    Culture   Final    NO GROWTH 2 DAYS Performed at [facility], [place] [address] [place], [place] [address]    Report Status PENDING  Incomplete     Scheduled Meds:  colchicine  0.6 mg Oral Daily   furosemide  40 mg Oral Daily   metoprolol tartrate  37.5 mg Oral BID   polyvinyl alcohol  1 drop Both Eyes BID   potassium chloride  40 mEq Oral Daily   predniSONE  40 mg Oral Q breakfast   sodium chloride flush  3 mL Intravenous Q12H   sulfamethoxazole-trimethoprim  1 tablet Oral Once per day on Mon Wed Fri   Continuous Infusions:  heparin 1,300 Units/hr (01/28/21 0412)      LOS: 8 days   Domenic Polite, MD,

## 2021-01-28 NOTE — Progress Notes (Signed)
Occupational Therapy Treatment Patient Details Name: Rebecca Short MRN: [id number] DOB: [birth year] Today's Date: 01/28/2021    History of present illness Pt is an 80yo female presenting to [facility] ED on 8/27 with increasing weakness and SOB. Recently discharged after bilateral pleural/pericardial effusions and returned  secondary to continued symptoms.  Thoracocentsis performed 8/28 with 1L drained. S/p throacentesis performed again 01/25/21 ith 800 cc's drained from L pleural space. PMH: afib with RVR, OSA, leukocytosis, macular degeneration.   OT comments  [name] continues to progress well. Pt was supervision for the entire session for safety only, no LOB or safety concern noted. She did not get SOB through all ADLs and functional activities, HR and SpO2 were WFL. Pt stood at the sink to groom and ambulated to the bathroom for toileting and to change her depends. Pt would benefit from shower transfer prior to d/c home. D/c plan remains appropriate.    Follow Up Recommendations  Home health OT;Supervision - Intermittent    Equipment Recommendations  None recommended by OT       Precautions / Restrictions Precautions Precautions: Fall Precaution Comments: Monitor HR and O2 Restrictions Weight Bearing Restrictions: No       Mobility Bed Mobility Overal bed mobility: Needs Assistance Bed Mobility: Supine to Sit     Supine to sit: Supervision;HOB elevated     General bed mobility comments: Pt able to come to sit with supervision with HOB elevated.    Transfers Overall transfer level: Needs assistance Equipment used: Rolling walker (2 wheeled) Transfers: Sit to/from Stand Sit to Stand: Supervision              Balance Overall balance assessment: Needs assistance Sitting-balance support: No upper extremity supported;Feet supported Sitting balance-Leahy Scale: Good     Standing balance support: Single extremity supported;During functional activity;No upper extremity  supported Standing balance-Leahy Scale: Fair Standing balance comment: pt able to groom at the sink with single or no UE supported with no LOB                           ADL either performed or assessed with clinical judgement   ADL Overall ADL's : Needs assistance/impaired     Grooming: Wash/dry hands;Wash/dry face;Oral care;Brushing hair;Standing Grooming Details (indicate cue type and reason): standing at the sink             Lower Body Dressing: Minimal assistance;Sit to/from stand Lower Body Dressing Details (indicate cue type and reason): assist to thread BLE, pt then able to bend over and pull underwear up and over hips in standing Toilet Transfer: Supervision/safety;Regular Toilet;Grab bars;RW Toilet Transfer Details (indicate cue type and reason): superivsion for safety, verbal cue for safety Toileting- Clothing Manipulation and Hygiene: Supervision/safety;Sit to/from stand Toileting - Clothing Manipulation Details (indicate cue type and reason): pt compelted pericare in standing     Functional mobility during ADLs: Supervision/safety;Rolling walker General ADL Comments: supervision for all tasks this session, no sitting rest break required     Vision       Perception     Praxis      Cognition Arousal/Alertness: Awake/alert Behavior During Therapy: WFL for tasks assessed/performed Overall Cognitive Status: Within Functional Limits for tasks assessed                                          Exercises  Shoulder Instructions       General Comments HR 100-120 during all functional tasks, SpO2 >92% enitre session    Pertinent Vitals/ Pain       Pain Assessment: No/denies pain Pain Intervention(s): Monitored during session;Repositioned  Home Living                                          Prior Functioning/Environment              Frequency  Min 2X/week        Progress Toward Goals  OT  Goals(current goals can now be found in the care plan section)  Progress towards OT goals: Progressing toward goals  Acute Rehab OT Goals Patient Stated Goal: to be able to walk without HR increasing significantly OT Goal Formulation: With patient Time For Goal Achievement: 02/08/21 Potential to Achieve Goals: Good ADL Goals Pt Will Perform Grooming: with modified independence;sitting;standing Pt Will Perform Upper Body Dressing: with modified independence;sitting Pt Will Perform Lower Body Dressing: with modified independence;sit to/from stand;sitting/lateral leans Pt Will Transfer to Toilet: with modified independence;ambulating Pt Will Perform Toileting - Clothing Manipulation and hygiene: with modified independence;sit to/from stand Pt Will Perform Tub/Shower Transfer: Shower transfer;with modified independence;shower seat;ambulating;grab bars Additional ADL Goal #1: Pt will demosntrate increased tolerance by completing 3 consecutive grooming tasks standing without rest breaks and independence. Additional ADL Goal #2: Pt will verbalize 3 energy conservation techniques to utilize during daily routine to optimize activity tolerance.  Plan Discharge plan remains appropriate    Co-evaluation                 AM-PAC OT "6 Clicks" Daily Activity     Outcome Measure   Help from another person eating meals?: None Help from another person taking care of personal grooming?: A Little Help from another person toileting, which includes using toliet, bedpan, or urinal?: A Lot Help from another person bathing (including washing, rinsing, drying)?: A Lot Help from another person to put on and taking off regular upper body clothing?: A Little Help from another person to put on and taking off regular lower body clothing?: A Lot 6 Click Score: 16    End of Session Equipment Utilized During Treatment: Rolling walker  OT Visit Diagnosis: Unsteadiness on feet (R26.81);Muscle weakness  (generalized) (M62.81)   Activity Tolerance Patient tolerated treatment well   Patient Left in bed;with call bell/phone within reach;with family/visitor present   Nurse Communication Mobility status        Time: 6378-5885 OT Time Calculation (min): 29 min  Charges: OT General Charges $OT Visit: 1 Visit OT Treatments $Self Care/Home Management : 23-37 mins    Catalia Massett A Saivion Goettel 01/28/2021, 4:22 PM

## 2021-01-28 NOTE — Progress Notes (Signed)
Progress Note  Patient Name: Rebecca Short Date of Encounter: 01/28/2021  [facility] Cardiologist: [name], MD   Subjective   Feels well.  Walked with a more controlled HR response.   Inpatient Medications    Scheduled Meds:  colchicine  0.6 mg Oral Daily   furosemide  40 mg Oral Daily   metoprolol tartrate  37.5 mg Oral BID   polyvinyl alcohol  1 drop Both Eyes BID   potassium chloride  40 mEq Oral Daily   predniSONE  40 mg Oral Q breakfast   sodium chloride flush  3 mL Intravenous Q12H   sulfamethoxazole-trimethoprim  1 tablet Oral Once per day on Mon Wed Fri   Continuous Infusions:  heparin 1,300 Units/hr (01/28/21 0412)   PRN Meds: acetaminophen **OR** [DISCONTINUED] acetaminophen   Vital Signs    Vitals:   01/27/21 0830 01/27/21 1948 01/28/21 0545 01/28/21 0849  BP:  123/85 140/83 120/79  Pulse:  85 79 90  Resp: 18 17 17    Temp:  98 F (36.7 C) 97.8 F (36.6 C)   TempSrc:  Oral Oral   SpO2: 98% 95% 96%   Weight:   81.6 kg   Height:        Intake/Output Summary (Last 24 hours) at 01/28/2021 1023 Last data filed at 01/28/2021 0547 Gross per 24 hour  Intake 340.25 ml  Output 1450 ml  Net -1109.75 ml   Last 3 Weights 01/28/2021 01/27/2021 01/26/2021  Weight (lbs) 179 lb 14.4 oz 179 lb 6.4 oz 188 lb 8 oz  Weight (kg) 81.602 kg 81.375 kg 85.503 kg      Telemetry    AFib, controlled ventrocular response - Personally Reviewed  ECG    8/27: AFib with RVR - Personally Reviewed  Physical Exam   GEN: No acute distress.   Neck: No JVD Cardiac: irregularly irregular, no murmurs, rubs, or gallops.  Respiratory: Decreased bibasilar breath sounds to auscultation bilaterally. GI: Soft, nontender, non-distended  MS: trace pretibial edema; No deformity. Neuro:  Nonfocal  Psych: Normal affect   Labs    High Sensitivity Troponin:   Recent Labs  Lab 01/03/21 0951 01/03/21 1338  TROPONINIHS 7 5      Chemistry Recent Labs  Lab 01/25/21 0219  01/27/21 0140 01/28/21 0107  NA 139 139 137  K 4.2 3.5 3.7  CL 106 109 105  CO2 25 26 26   GLUCOSE 119* 134* 120*  BUN 23 26* 24*  CREATININE 0.60 0.59 0.65  CALCIUM 8.7* 8.3* 8.3*  GFRNONAA >60 >60 >60  ANIONGAP 8 4* 6     Hematology Recent Labs  Lab 01/26/21 0134 01/27/21 0140 01/28/21 0107  WBC 7.9 7.6 9.6  RBC 4.03 4.13 4.17  HGB 11.8* 11.9* 12.1  HCT 35.6* 37.0 36.9  MCV 88.3 89.6 88.5  MCH 29.3 28.8 29.0  MCHC 33.1 32.2 32.8  RDW 14.5 14.5 14.6  PLT 331 350 341    BNP Recent Labs  Lab 01/23/21 0553  BNP 53.4     DDimer No results for input(s): DDIMER in the last 168 hours.   Radiology    DG Chest 1 View  Result Date: 01/26/2021 CLINICAL DATA:  Thoracentesis. EXAM: CHEST  1 VIEW COMPARISON:  Chest radiograph 01/25/2021. FINDINGS: Stable cardiac and mediastinal contours. Similar small left pleural effusion with underlying opacities. Slight interval decrease in size of small right pleural effusion. Persistent right lung base opacities. No pneumothorax. Thoracic spine degenerative changes. IMPRESSION: Persistent layering bilateral effusions and  underlying atelectasis. No definite pneumothorax. Electronically Signed   By: Annia Belt M.D.   On: 01/26/2021 11:18   DG Chest Port 1 View  Result Date: 01/28/2021 CLINICAL DATA:  Status post thoracentesis EXAM: PORTABLE CHEST 1 VIEW COMPARISON:  01/26/2021 chest radiograph. FINDINGS: Stable cardiomediastinal silhouette with mild cardiomegaly. No pneumothorax. Small bilateral pleural effusions, similar. No overt pulmonary edema. Mild-to-moderate bibasilar hazy lung opacities, favor atelectasis, similar. IMPRESSION: No pneumothorax. Stable small bilateral pleural effusions and bibasilar hazy lung opacities, favor atelectasis. Electronically Signed   By: Delbert Phenix M.D.   On: 01/28/2021 07:32    Cardiac Studies   I personally reviewed her CXR from today and her echo this admission  "Significant Events:  7/31 - 8/2 admit  for afib w/ RVR and hypotension 8/11 - 8/19 admit for PNA w/ pericardial and pleural effusions  8/14 thoracentesis - 950cc drained c/w exudate  8/15 pericardiocentesis - 570cc drained  8/16 pericardial drain removed  8/27 admit via ED 8/28, thoracentesis in IR, 1 L of amber fluid drained 8/31 starting steroids 9/2: Left thoracentesis, 800 mL of amber fluid drained" per Dr. Edison Simon note  Patient Profile     80 y.o. female PNA, pleural effusion, prior pericardial effusion  Assessment & Plan    Pericardial effusion is minimal at this time.  AFib: rate control improve. COntinue current dose of metoprolol.  May need to continue this as an outpatient. Eliquis for stroke prevention when all procedures are completed.   S/p multiple thoracentesis for pleural effusions.  Pulm following.      For questions or updates, please contact [facility] Please consult [email or web address] for contact info under        Signed, [name], MD  01/28/2021, 10:23 AM

## 2021-01-28 NOTE — Progress Notes (Signed)
Pt's cpap set up at bedside and pt places self on/off when ready.  RT will monitor.

## 2021-01-28 NOTE — Care Management Important Message (Signed)
Important Message  Patient Details  Name: Rebecca Short MRN: [id number] Date of Birth: 05-Dec-1940   Medicare Important Message Given:  Yes     [name] 01/28/2021, 10:22 AM

## 2021-01-28 NOTE — Progress Notes (Signed)
ANTICOAGULATION CONSULT NOTE - Follow Up Consult  Pharmacy Consult for heparin Indication: atrial fibrillation   Allergies  Allergen Reactions   Azithromycin Other (See Comments)    Pain in arm with IV infusion    Condrolite [Glucosamine-Chondroitin-Msm] Rash    Patient Measurements: Height: 5\' 2"  (157.5 cm) Weight: 81.6 kg (179 lb 14.4 oz) IBW/kg (Calculated) : 50.1  Vital Signs: Temp: 97.8 Short (36.6 C) (09/05 0545) Temp Source: Oral (09/05 0545) BP: 120/79 (09/05 0849) Pulse Rate: 90 (09/05 0849)  Labs: Recent Labs    01/26/21 0134 01/26/21 0134 01/27/21 0140 01/27/21 0920 01/28/21 0107 01/28/21 1106  HGB 11.8*  --  11.9*  --  12.1  --   HCT 35.6*  --  37.0  --  36.9  --   PLT 331  --  350  --  341  --   HEPARINUNFRC  --    < > 0.30 0.35 0.71* 0.76*  CREATININE  --   --  0.59  --  0.65  --    < > = values in this interval not displayed.    Estimated Creatinine Clearance: 55.5 mL/min (by C-G formula based on SCr of 0.65 mg/dL).   Medications:  Infusions:   heparin 1,300 Units/hr (01/28/21 03/30/21)    Assessment: 80 yo Short on apixaban PTA for hx Afib.  Oral anticoagulation was held pending possible need for procedures.  Pharmacy was consulted for heparin dosing.  Heparin was stopped 9/2 AM for thoracentesis.  Per CCM, heparin was resumed 9/3 PM.  Heparin gtt discontinued 9/5.  Pharmacy now consulted for apixaban dosing.   No noted signs/symptoms of bleeding.  Per MD, will resume PTA apixaban dose (5 mg PO BID) for Afib.   Goal of Therapy:  Monitor platelets by anticoagulation protocol: Yes   Plan:  Apixaban 5 mg PO BID.  Monitor CBC and for signs/symptoms of bleeding.   11/5, PharmD PGY1 Pharmacy Resident Phone [phone] 01/28/2021 12:14 PM   Please check AMION for all [facility] phone numbers After 10:00 PM, call Main Pharmacy [phone]

## 2021-01-28 NOTE — Telephone Encounter (Signed)
Seen in the hospital for Pleural effusions. She will need hospital follow up with Dr. Francine Graven with CXR prior. Please watch for discharge and call her to schedule Hospital Follow up. Thanks so much.

## 2021-01-29 DIAGNOSIS — I313 Pericardial effusion (noninflammatory): Secondary | ICD-10-CM | POA: Diagnosis not present

## 2021-01-29 DIAGNOSIS — J90 Pleural effusion, not elsewhere classified: Secondary | ICD-10-CM | POA: Diagnosis not present

## 2021-01-29 DIAGNOSIS — I4891 Unspecified atrial fibrillation: Secondary | ICD-10-CM | POA: Diagnosis not present

## 2021-01-29 LAB — BASIC METABOLIC PANEL
Anion gap: 12 (ref 5–15)
BUN: 27 mg/dL — ABNORMAL HIGH (ref 8–23)
CO2: 18 mmol/L — ABNORMAL LOW (ref 22–32)
Calcium: 8.4 mg/dL — ABNORMAL LOW (ref 8.9–10.3)
Chloride: 106 mmol/L (ref 98–111)
Creatinine, Ser: 0.8 mg/dL (ref 0.44–1.00)
GFR, Estimated: >60 mL/min (ref >60)
Glucose, Bld: 127 mg/dL — ABNORMAL HIGH (ref 70–99)
Potassium: 4.1 mmol/L (ref 3.5–5.1)
Sodium: 136 mmol/L (ref 135–145)

## 2021-01-29 LAB — BODY FLUID CULTURE W GRAM STAIN
Culture: NO GROWTH
Culture: NO GROWTH

## 2021-01-29 LAB — PH, BODY FLUID: pH, Body Fluid: 7.8

## 2021-01-29 LAB — CBC
HCT: 41.1 % (ref 36.0–46.0)
Hemoglobin: 12.8 g/dL (ref 12.0–15.0)
MCH: 29.1 pg (ref 26.0–34.0)
MCHC: 31.1 g/dL (ref 30.0–36.0)
MCV: 93.4 fL (ref 80.0–100.0)
Platelets: 300 10*3/uL (ref 150–400)
RBC: 4.4 MIL/uL (ref 3.87–5.11)
RDW: 15.1 % (ref 11.5–15.5)
WBC: 11.6 10*3/uL — ABNORMAL HIGH (ref 4.0–10.5)
nRBC: 0 % (ref 0.0–0.2)

## 2021-01-29 MED ORDER — POTASSIUM CHLORIDE 20 MEQ PO PACK
20.0000 meq | PACK | Freq: Every day | ORAL | Status: DC
  Administered 2021-01-30: 20 meq via ORAL
  Filled 2021-01-29: qty 1

## 2021-01-29 MED ORDER — FUROSEMIDE 20 MG PO TABS
20.0000 mg | ORAL_TABLET | Freq: Every day | ORAL | Status: DC
  Administered 2021-01-30: 20 mg via ORAL
  Filled 2021-01-29: qty 1

## 2021-01-29 NOTE — Progress Notes (Addendum)
Progress Note  Patient Name: Rebecca Short Date of Encounter: 01/29/2021  Primary Cardiologist: Larae Grooms, MD  Subjective   Feeling good today. No CP or SOB. Feels like the extra 1/2 tablet of metoprolol BID has helped. Husband at bedside along with friend Bertram Millard who retired from [employer] recently.  Inpatient Medications    Scheduled Meds:  apixaban  5 mg Oral BID   colchicine  0.6 mg Oral Daily   furosemide  40 mg Oral Daily   metoprolol tartrate  37.5 mg Oral BID   pantoprazole  40 mg Oral Q1200   polyvinyl alcohol  1 drop Both Eyes BID   potassium chloride  40 mEq Oral Daily   predniSONE  40 mg Oral Q breakfast   sodium chloride flush  3 mL Intravenous Q12H   sulfamethoxazole-trimethoprim  1 tablet Oral Once per day on Mon Wed Fri   Continuous Infusions:  PRN Meds: acetaminophen **OR** [DISCONTINUED] acetaminophen   Vital Signs    Vitals:   01/28/21 1945 01/28/21 2112 01/29/21 0426 01/29/21 0749  BP: 122/84  134/88 129/75  Pulse: 81 79 74 71  Resp: $Remo'15 18 19 18  'qQics$ Temp: 98.5 F (36.9 C)  97.8 F (36.6 C) (!) 97.4 F (36.3 C)  TempSrc: Oral  Oral Oral  SpO2: 97% 98% 99% 97%  Weight:   80.5 kg   Height:        Intake/Output Summary (Last 24 hours) at 01/29/2021 1000 Last data filed at 01/28/2021 1900 Gross per 24 hour  Intake 482.85 ml  Output 1550 ml  Net -1067.15 ml   Last 3 Weights 01/29/2021 01/28/2021 01/27/2021  Weight (lbs) 177 lb 8 oz 179 lb 14.4 oz 179 lb 6.4 oz  Weight (kg) 80.513 kg 81.602 kg 81.375 kg     Telemetry    Rate controlled atrial fib - Personally Reviewed   Physical Exam   GEN: No acute distress.  HEENT: Normocephalic, atraumatic, sclera non-icteric. Neck: No JVD or bruits. Cardiac: Irregularly irregular rhythm, rate controlled, no murmurs, rubs, or gallops.  Respiratory: Decreased BS at bases otherwise clear to auscultation bilaterally. Breathing is unlabored. GI: Soft, nontender, non-distended, BS +x 4. MS: no  deformity. Extremities: No clubbing or cyanosis. No edema. Distal pedal pulses are 2+ and equal bilaterally. Neuro:  AAOx3. Follows commands. Psych:  Responds to questions appropriately with a normal affect.  Labs    High Sensitivity Troponin:   Recent Labs  Lab 01/03/21 0951 01/03/21 1338  TROPONINIHS 7 5      Cardiac EnzymesNo results for input(s): TROPONINI in the last 168 hours. No results for input(s): TROPIPOC in the last 168 hours.   Chemistry Recent Labs  Lab 01/27/21 0140 01/28/21 0107 01/29/21 0236  NA 139 137 136  K 3.5 3.7 4.1  CL 109 105 106  CO2 26 26 18*  GLUCOSE 134* 120* 127*  BUN 26* 24* 27*  CREATININE 0.59 0.65 0.80  CALCIUM 8.3* 8.3* 8.4*  GFRNONAA >60 >60 >60  ANIONGAP 4* 6 12     Hematology Recent Labs  Lab 01/27/21 0140 01/28/21 0107 01/29/21 0236  WBC 7.6 9.6 11.6*  RBC 4.13 4.17 4.40  HGB 11.9* 12.1 12.8  HCT 37.0 36.9 41.1  MCV 89.6 88.5 93.4  MCH 28.8 29.0 29.1  MCHC 32.2 32.8 31.1  RDW 14.5 14.6 15.1  PLT 350 341 300    BNP Recent Labs  Lab 01/23/21 0553  BNP 53.4     DDimer No results for  input(s): DDIMER in the last 168 hours.   Radiology    DG Chest Port 1 View  Result Date: 01/28/2021 CLINICAL DATA:  Status post thoracentesis EXAM: PORTABLE CHEST 1 VIEW COMPARISON:  01/26/2021 chest radiograph. FINDINGS: Stable cardiomediastinal silhouette with mild cardiomegaly. No pneumothorax. Small bilateral pleural effusions, similar. No overt pulmonary edema. Mild-to-moderate bibasilar hazy lung opacities, favor atelectasis, similar. IMPRESSION: No pneumothorax. Stable small bilateral pleural effusions and bibasilar hazy lung opacities, favor atelectasis. Electronically Signed   By: Ilona Sorrel M.D.   On: 01/28/2021 07:32    Cardiac Studies   2D echo 01/20/21   1. Left ventricular ejection fraction, by estimation, is 60 to 65%. The  left ventricle has normal function. The left ventricle has no regional  wall motion  abnormalities.   2. A small pericardial effusion is present. There is no evidence of  cardiac tamponade.   3. The inferior vena cava is normal in size with greater than 50%  respiratory variability, suggesting right atrial pressure of 3 mmHg.   Comparison(s): No significant change from prior study.   Conclusion(s)/Recommendation(s): Limited study to assess pericardial  effusion. Effusion is small, most prominent near posterior LV but seen  adjacent to RV as well. IVC is small and collapses. No echo evidence of  tamponade. Pericardial effusion unchanged  from study dated 01/08/21. Pleural effusion smaller (current echo done post  thoracentesis). Mild ascites noted as well.   Patient Profile     80 y.o. female with permanent atrial fib, recent pericardial/pleural effusions wuith drainage, macular degeneration, obesity, OSA. She was admitted 7/31-8/2 for afib with RVR and hypotension. She was admitted 8/11-8/19 for PNA with pericardial and pleural effusions - underwent thoracentesis 8/14 (950cc drained with exudate), pericardiocentesis 8/15 (570cc drained) with pericardial drain removed 8/16. Returned back to the ED 8/27with weakness, hypotension, poor appetite, found to have soft BP and AF RVR. Also found to have recurrent bilateral pleural effusions L>R. on 01/20/21, had 1L left fluid drained again, reported to be c/w exudate, cultures negative. Required another L thoracentesis 9/2 yielding 800cc. Pulmonology is concerned for inflammatory etiology in setting of high ESR, CRP and RF. Repeat echo 8/28 with normal EF, small pericardial effusion without significant change from prior. Cardiology has seen for atrial fib.  Assessment & Plan    1. 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 Pericardial effusion s/p thoracentesis 12/2020 - repeat echo this admission with small effusion - continue colchicine, anticipate 3 months of therapy - now on steroids at direction of PCCM for inflammatory process  3. Permanent atrial fib - rates controlled on present regimen - metoprolol 37.$RemoveBeforeD'5mg'[id number]$  BID - cleared to restart Eliquis by PCCM  4. OSA - continue CPAP qhs  Patient has f/u 9/14 with Dr. Irish Lack which we will keep.  For questions or updates, please contact [facility] Please consult [email or web address] for contact info under Cardiology/STEMI.  Signed, Charlie Pitter, PA-C 01/29/2021, 10:00 AM   As above, patient seen and examined.  She denies chest pain or dyspnea.  Her heart rate is now controlled.  Continue metoprolol at present dose.  Continue apixaban.  Follow-up echocardiogram this admission shows small pericardial effusion.  Her  predominant issue appears to be inflammatory serositis.  We will decrease Lasix to 20 mg daily and kdur  to 20 meq daily.  Check potassium and renal function when she sees Dr. Irish Lack see back as scheduled on September 14.  Cardiology will sign off.  Please call with questions.  Kirk Ruths, MD

## 2021-01-29 NOTE — TOC Initial Note (Signed)
Transition of Care [facility]) - Initial/Assessment Note    Patient Details  Name: Rebecca Short MRN: [id number] Date of Birth: 1940-08-07  Transition of Care [facility]) CM/SW Contact:    Gala Lewandowsky, RN Phone Number: 01/29/2021, 2:05 PM  Clinical Narrative:  Case Manager spoke with patient regarding home health services. The patient states she is currently active with [facility] for PT and wants to add OT for bath assistance. Case Manager called the referral to [facility] and the agency can add OT services. No durable medical equipment is needed at this time. Patient has a cane, bedside commode, and rolling walker in the home. Patient will need resumption home health orders, PT,OT and F2F once stable to transition home. No further needs identified at this time.                Expected Discharge Plan: Home w Home Health Services Barriers to Discharge: No Barriers Identified   Patient Goals and CMS Choice Patient states their goals for this hospitalization and ongoing recovery are:: to return home.   Choice offered to / list presented to : Patient  Expected Discharge Plan and Services Expected Discharge Plan: Home w Home Health Services   Discharge Planning Services: CM Consult Post Acute Care Choice: Resumption of Svcs/PTA Provider, Home Health Living arrangements for the past 2 months: Single Family Home                 DME Arranged: N/A DME Agency: NA       HH Arranged: PT, OT HH Agency: [facility] Date HH Agency Contacted: 01/29/21 Time HH Agency Contacted: 1404 Representative spoke with at [facility] Agency: Amy  Prior Living Arrangements/Services Living arrangements for the past 2 months: Single Family Home Lives with:: Spouse Patient language and need for interpreter reviewed:: Yes Do you feel safe going back to the place where you live?: Yes      Need for Family Participation in Patient Care: Yes (Comment) Care giver support system in place?: Yes  (comment) Current home services: DME, Home PT (RW, BSC, Cane, active with [facility] for PT adding OT) Criminal Activity/Legal Involvement Pertinent to Current Situation/Hospitalization: No - Comment as needed  Activities of Daily Living Home Assistive Devices/Equipment: Environmental consultant (specify type), Cane (specify quad or straight) (single cane) ADL Screening (condition at time of admission) Patient's cognitive ability adequate to safely complete daily activities?: Yes Is the patient deaf or have difficulty hearing?: No Does the patient have difficulty seeing, even when wearing glasses/contacts?: No Does the patient have difficulty concentrating, remembering, or making decisions?: No Patient able to express need for assistance with ADLs?: Yes Does the patient have difficulty dressing or bathing?: Yes Independently performs ADLs?: Yes (appropriate for developmental age) Does the patient have difficulty walking or climbing stairs?: Yes Weakness of Legs: Both Weakness of Arms/Hands: Both  Permission Sought/Granted Permission sought to share information with : Family Supports, Magazine features editor, Case Automotive engineer granted to share info w AGENCY: [employer]        Emotional Assessment Appearance:: Appears stated age Attitude/Demeanor/Rapport: Engaged Affect (typically observed): Appropriate Orientation: : Oriented to Situation, Oriented to  Time, Oriented to Place, Oriented to Self Alcohol / Substance Use: Not Applicable Psych Involvement: No (comment)  Admission diagnosis:  Pleural effusion [J90] Atrial fibrillation with RVR (HCC) [I48.91] Patient Active Problem List   Diagnosis Date Noted   Arterial hypotension    Pleural effusion on left  Pericardial effusion    Leukocytosis 01/03/2021   Community acquired pneumonia 01/03/2021   Atrial fibrillation (HCC) 12/24/2020   Atrial fibrillation with RVR (HCC) 12/23/2020   Musculoskeletal pain 12/23/2020   Posterior  vitreous detachment of both eyes 04/30/2020   Macular pucker, right eye 11/08/2019   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HCC) 11/08/2019   Intermediate stage nonexudative age-related macular degeneration of both eyes 11/08/2019   Anticoagulated 11/18/2016   Bradycardia 03/23/2014   OSA (obstructive sleep apnea) 01/19/2014   Obesity (BMI 30-39.9) 01/19/2014   Paroxysmal A-fib (HCC) 10/04/2013   Nonspecific abnormal unspecified cardiovascular function study 07/21/2013   Acute gastroenteritis 07/15/2013   PCP:  Daisy Floro, MD Pharmacy:   [facility] [phone] - [place], [place] - [address] [place] [id number] Phone: [phone] Fax: [phone]     Social Determinants of Health (SDOH) Interventions    Readmission Risk Interventions Readmission Risk Prevention Plan 01/11/2021  Post Dischage Appt Complete  Medication Screening Complete  Transportation Screening Complete  Some recent data might be hidden

## 2021-01-29 NOTE — Progress Notes (Signed)
BMET recommendation added to appt notes for 9/14 with Dr. Eldridge Dace. Also clarified colchicine dose w/ Dr. Jens Som - he recommends to continue once daily. Included appt reminder on AVS.

## 2021-01-29 NOTE — Progress Notes (Signed)
Rebecca Short  VWU:[id number] DOB: Sep 27, 1940 DOA: 01/19/2021 PCP: Lawerance Cruel, MD    Brief Narrative:  80 year old with a history of permanent atrial fibrillation, obesity, and OSA who presented to the ED with severe ongoing generalized weakness and shortness of breath, suffering with blood pressures into the 80s at home.  She was admitted in July with atrial fibrillation.  She was admitted in August with pleural effusions and a pericardial effusion.  At that time 1 L of an exudative fluid was drained from her left pleural space.  She was discharged on colchicine for her pericardial effusion.  In the ED a CXR noted a similar moderate left pleural effusion and a small increase in an otherwise small right pleural effusion.  Cardiology, IR and pulmonology consulted.  S/p left diagnostic and therapeutic thoracentesis 1 L on 8/28.  Significant Events:  7/31 - 8/2 admit for afib w/ RVR and hypotension 8/11 - 8/19 admit for PNA w/ pericardial and pleural effusions  8/14 thoracentesis - 950cc drained c/w exudate  8/15 pericardiocentesis - 570cc drained  8/16 pericardial drain removed  8/27 third admission via ED 8/28, thoracentesis in IR, 1 L of amber fluid drained 8/31 starting steroids 9/2: Left thoracentesis, 800 mL of amber fluid drained 9/3: Right thoracentesis  Subjective: -Feels okay overall, too worried to go home today, thinks she will be ready tomorrow, breathing is improving  Assessment & Plan:   Chronic atrial fibrillation with RVR Cardiology following.   -Was on metoprolol and Cardizem gtt., Cardizem weaned off, metoprolol dose increased -Apixaban switched to IV heparin due to need for thoracentesis -Transitioned back to apixaban  Recurrent bilateral pleural effusions, left >R -On 8/14 had exudative fluid drained, around 1 L, treated for pneumonia as well then, CT without evidence of mass or other concerning findings  -On 8/28, 1 L of fluid drained again, consistent with  exudate, cultures are negative -Appreciate pulmonary input, concern for inflammatory etiology in the setting of high ESR, CRP and rheumatoid factor -Additional serologies ordered, started prednisone 40 mg daily on 8/31 -Repeat chest x-ray noted moderate to large bilateral effusions -Pulmonary following, underwent left thoracentesis 9/2, 800 mL drained -Underwent right thoracentesis on 9/3 -pulm recommends to continue current dose of prednisone for about a month after which she can be tapered -QuantiFERON gold negative -Completed 5-day course of cefepime -Also started on Lasix, dose decreased to 20 mg daily due to long history of lower extremity edema, which may make above issue worse -Repeat x-ray tomorrow, discharged home in stable, with close pulmonary follow-up  Pericardial effusion/pericarditis -Underwent pericardiocentesis on 8/15 for large pericardial effusion, started on colchicine then  -Repeat echo with small pericardial effusion  -Continue colchicine, starting steroids, see discussion above   Tachycardia induced cardiomyopathy Suffered recent acute CHF episode felt to be due to tachycardia -was previously diagnosed and is usually on Lasix 40 mg daily  OSA on CPAP Continue usual CPAP dosing  DVT prophylaxis: Switch back to apixaban CODE STATUS: Full code Family Communication: Spouse at bedside status is: Inpatient  Remains inpatient appropriate because:Inpatient level of care appropriate due to severity of illness  Dispo: The patient is from: Home              Anticipated d/c is to: Home tomorrow if x-ray is stable              Patient currently is not medically stable to d/c.   Difficult to place patient No  Consultants:  [facility] Cardiology  Interventional radiology Pulmonology.  Antimicrobials:  Vancomycin 8/27 > 8/28 Cefepime 8/27 > 9/2  Procedures 8/28 ultrasound-guided left thoracentesis: 1 L drained 9/2 left thoracentesis -by pulmonary : 800 mL drained 9/3  right thoracentesis   Objective:  Vitals:   01/28/21 2112 01/29/21 0426 01/29/21 0749 01/29/21 1440  BP:  134/88 129/75 121/69  Pulse: 79 74 71 73  Resp: $Remo'18 19 18 18  'vWjiS$ Temp:  97.8 F (36.6 C) (!) 97.4 F (36.3 C) 97.8 F (36.6 C)  TempSrc:  Oral Oral Oral  SpO2: 98% 99% 97% 96%  Weight:  80.5 kg    Height:       Examination: General exam:  Obese pleasant female sitting up in bed, AAOx3, no distress CVS: S1-S2, irregularly irregular rhythm Lungs: Decreased breath sounds both bases otherwise clear Abdomen: Soft, nontender, bowel sounds present Extremities: No edema Skin: No rash on exposed skin    CBC: Recent Labs  Lab 01/27/21 0140 01/28/21 0107 01/29/21 0236  WBC 7.6 9.6 11.6*  HGB 11.9* 12.1 12.8  HCT 37.0 36.9 41.1  MCV 89.6 88.5 93.4  PLT 350 341 748   Basic Metabolic Panel: Recent Labs  Lab 01/25/21 0816 01/27/21 0140 01/28/21 0107 01/29/21 0236  NA  --  139 137 136  K  --  3.5 3.7 4.1  CL  --  109 105 106  CO2  --  26 26 18*  GLUCOSE  --  134* 120* 127*  BUN  --  26* 24* 27*  CREATININE  --  0.59 0.65 0.80  CALCIUM  --  8.3* 8.3* 8.4*  MG 2.0  --   --   --    GFR: Estimated Creatinine Clearance: 55.2 mL/min (by C-G formula based on SCr of 0.8 mg/dL).  Liver Function Tests: No results for input(s): AST, ALT, ALKPHOS, BILITOT, PROT, ALBUMIN in the last 168 hours.   HbA1C: Hgb A1c MFr Bld  Date/Time Value Ref Range Status  12/23/2020 10:12 PM 5.4 4.8 - 5.6 % Final    Comment:    (NOTE) Pre diabetes:          5.7%-6.4%  Diabetes:              >6.4%  Glycemic control for   <7.0% adults with diabetes     CBG: No results for input(s): GLUCAP in the last 168 hours.   Recent Results (from the past 240 hour(s))  Blood culture (routine x 2)     Status: None   Collection Time: 01/19/21  5:17 PM   Specimen: BLOOD  Result Value Ref Range Status   Specimen Description BLOOD IV START  Final   Special Requests   Final    BOTTLES DRAWN  AEROBIC AND ANAEROBIC Blood Culture results may not be optimal due to an inadequate volume of blood received in culture bottles   Culture   Final    NO GROWTH 5 DAYS Performed at [facility], [place] [address] [place], [place] [address]    Report Status 01/24/2021 FINAL  Final  Resp Panel by RT-PCR (Flu A&B, Covid) Nasopharyngeal Swab     Status: None   Collection Time: 01/19/21  6:03 PM   Specimen: Nasopharyngeal Swab; Nasopharyngeal(NP) swabs in vial transport medium  Result Value Ref Range Status   SARS Coronavirus 2 by RT PCR NEGATIVE NEGATIVE Final    Comment: (NOTE) SARS-CoV-2 target nucleic acids are NOT DETECTED.  The SARS-CoV-2 RNA is generally detectable in upper respiratory specimens during the  acute phase of infection. The lowest concentration of SARS-CoV-2 viral copies this assay can detect is 138 copies/mL. A negative result does not preclude SARS-Cov-2 infection and should not be used as the sole basis for treatment or other patient management decisions. A negative result may occur with  improper specimen collection/handling, submission of specimen other than nasopharyngeal swab, presence of viral mutation(s) within the areas targeted by this assay, and inadequate number of viral copies(<138 copies/mL). A negative result must be combined with clinical observations, patient history, and epidemiological information. The expected result is Negative.  Fact Sheet for Patients:  EntrepreneurPulse.com.au  Fact Sheet for Healthcare Providers:  IncredibleEmployment.be  This test is no t yet approved or cleared by the [place] FDA and  has been authorized for detection and/or diagnosis of SARS-CoV-2 by FDA under an Emergency Use Authorization (EUA). This EUA will remain  in effect (meaning this test can be used) for the duration of the COVID-19 declaration under Section 564(b)(1) of the Act, 21 U.S.C.section 360bbb-3(b)(1),  unless the authorization is terminated  or revoked sooner.       Influenza A by PCR NEGATIVE NEGATIVE Final   Influenza B by PCR NEGATIVE NEGATIVE Final    Comment: (NOTE) The Xpert Xpress SARS-CoV-2/FLU/RSV plus assay is intended as an aid in the diagnosis of influenza from Nasopharyngeal swab specimens and should not be used as a sole basis for treatment. Nasal washings and aspirates are unacceptable for Xpert Xpress SARS-CoV-2/FLU/RSV testing.  Fact Sheet for Patients: EntrepreneurPulse.com.au  Fact Sheet for Healthcare Providers: IncredibleEmployment.be  This test is not yet approved or cleared by the [place] FDA and has been authorized for detection and/or diagnosis of SARS-CoV-2 by FDA under an Emergency Use Authorization (EUA). This EUA will remain in effect (meaning this test can be used) for the duration of the COVID-19 declaration under Section 564(b)(1) of the Act, 21 U.S.C. section 360bbb-3(b)(1), unless the authorization is terminated or revoked.  Performed at [facility], [place] [address] [place], [place] [address]   Body fluid culture w Gram Stain     Status: None   Collection Time: 01/20/21 11:36 AM   Specimen: PATH Cytology Pleural fluid  Result Value Ref Range Status   Specimen Description PLEURAL FLUID  Final   Special Requests CYTO  Final   Gram Stain   Final    FEW WBC PRESENT,BOTH PMN AND MONONUCLEAR NO ORGANISMS SEEN    Culture   Final    NO GROWTH 3 DAYS Performed at [facility], [place] [address], [place], [place] [address]    Report Status 01/23/2021 FINAL  Final  Body fluid culture w Gram Stain     Status: None   Collection Time: 01/25/21  1:57 PM   Specimen: Pleural Fluid  Result Value Ref Range Status   Specimen Description FLUID PLEURAL  Final   Special Requests NONE  Final   Gram Stain   Final    RARE WBC PRESENT, PREDOMINANTLY PMN NO ORGANISMS SEEN    Culture   Final    NO  GROWTH 3 DAYS Performed at [facility], [place] [address], [place], [place] [address]    Report Status 01/29/2021 FINAL  Final  Body fluid culture w Gram Stain     Status: None   Collection Time: 01/26/21  9:53 AM   Specimen: Pleural Fluid  Result Value Ref Range Status   Specimen Description FLUID  Final   Special Requests NONE  Final  Gram Stain   Final    FEW WBC PRESENT, PREDOMINANTLY MONONUCLEAR NO ORGANISMS SEEN    Culture   Final    NO GROWTH 3 DAYS Performed at [place] [address], [place], [place] [address]    Report Status 01/29/2021 FINAL  Final     Scheduled Meds:  apixaban  5 mg Oral BID   colchicine  0.6 mg Oral Daily   [START ON 01/30/2021] furosemide  20 mg Oral Daily   metoprolol tartrate  37.5 mg Oral BID   pantoprazole  40 mg Oral Q1200   polyvinyl alcohol  1 drop Both Eyes BID   [START ON 01/30/2021] potassium chloride  20 mEq Oral Daily   predniSONE  40 mg Oral Q breakfast   sodium chloride flush  3 mL Intravenous Q12H   sulfamethoxazole-trimethoprim  1 tablet Oral Once per day on Mon Wed Fri   Continuous Infusions:    LOS: 9 days   Domenic Polite, MD,

## 2021-01-30 ENCOUNTER — Inpatient Hospital Stay (HOSPITAL_COMMUNITY): Payer: Medicare Other

## 2021-01-30 DIAGNOSIS — I4891 Unspecified atrial fibrillation: Secondary | ICD-10-CM | POA: Diagnosis not present

## 2021-01-30 LAB — CBC
HCT: 39.7 % (ref 36.0–46.0)
Hemoglobin: 13.2 g/dL (ref 12.0–15.0)
MCH: 29.2 pg (ref 26.0–34.0)
MCHC: 33.2 g/dL (ref 30.0–36.0)
MCV: 87.8 fL (ref 80.0–100.0)
Platelets: 347 10*3/uL (ref 150–400)
RBC: 4.52 MIL/uL (ref 3.87–5.11)
RDW: 14.8 % (ref 11.5–15.5)
WBC: 12 10*3/uL — ABNORMAL HIGH (ref 4.0–10.5)
nRBC: 0 % (ref 0.0–0.2)

## 2021-01-30 LAB — BASIC METABOLIC PANEL
Anion gap: 6 (ref 5–15)
BUN: 26 mg/dL — ABNORMAL HIGH (ref 8–23)
CO2: 28 mmol/L (ref 22–32)
Calcium: 8.5 mg/dL — ABNORMAL LOW (ref 8.9–10.3)
Chloride: 101 mmol/L (ref 98–111)
Creatinine, Ser: 0.86 mg/dL (ref 0.44–1.00)
GFR, Estimated: >60 mL/min (ref >60)
Glucose, Bld: 124 mg/dL — ABNORMAL HIGH (ref 70–99)
Potassium: 4.1 mmol/L (ref 3.5–5.1)
Sodium: 135 mmol/L (ref 135–145)

## 2021-01-30 LAB — CYTOLOGY - NON PAP

## 2021-01-30 MED ORDER — PANTOPRAZOLE SODIUM 40 MG PO TBEC: 40.0000 mg | DELAYED_RELEASE_TABLET | Freq: Every day | ORAL | 0 refills | Status: DC

## 2021-01-30 MED ORDER — METOPROLOL TARTRATE 25 MG PO TABS: 37.5000 mg | ORAL_TABLET | Freq: Two times a day (BID) | ORAL | 1 refills | Status: DC

## 2021-01-30 MED ORDER — SULFAMETHOXAZOLE-TRIMETHOPRIM 800-160 MG PO TABS: 1.0000 | ORAL_TABLET | ORAL | 0 refills | Status: DC

## 2021-01-30 MED ORDER — FUROSEMIDE 40 MG PO TABS: 20.0000 mg | ORAL_TABLET | Freq: Every day | ORAL | 0 refills | Status: DC

## 2021-01-30 MED ORDER — PREDNISONE 20 MG PO TABS: 40.0000 mg | ORAL_TABLET | Freq: Every day | ORAL | 1 refills | Status: DC

## 2021-01-30 MED ORDER — POTASSIUM CHLORIDE 20 MEQ PO PACK: 20.0000 meq | PACK | Freq: Every day | ORAL | 0 refills | Status: DC

## 2021-01-30 NOTE — Discharge Summary (Signed)
Physician Discharge Summary  JAREN KEARN TDD:[id number] DOB: [birth year] DOA: 01/19/2021  PCP: Lawerance Cruel, MD  Admit date: 01/19/2021 Discharge date: 01/30/2021  Time spent: 74minutes  Recommendations for Outpatient Follow-up:  Cardiology Dr.Varanasi on 9/14, please check BMP at follow-up Pulmonary Dr. Freda Jackson on 10/12 with chest x-ray, taper prednisone, Bactrim dose at follow-up PCP in 1 week   Discharge Diagnoses:   Inflammatory serositis Recurrent bilateral pleural effusion Recent pericardial effusion   Atrial fibrillation with RVR (HCC)   OSA (obstructive sleep apnea)   Anticoagulated   Leukocytosis   Pericardial effusion   Pleural effusion on left   Discharge Condition: Stable  Diet recommendation: Low-sodium  Filed Weights   01/28/21 0545 01/29/21 0426 01/30/21 0526  Weight: 81.6 kg 80.5 kg 79.6 kg    History of present illness:  80 year old with a history of permanent atrial fibrillation, obesity, and OSA who presented to the ED with severe ongoing generalized weakness and shortness of breath, suffering with blood pressures into the 80s at home.  She was admitted in July with atrial fibrillation.  She was admitted in August with pleural effusions and a pericardial effusion.  At that time 1 L of an exudative fluid was drained from her left pleural space.  She was discharged on colchicine for her pericardial effusion.  In the ED a CXR noted a similar moderate left pleural effusion and a small increase in an otherwise small right pleural effusion.  Cardiology, IR and pulmonology consulted.  S/p left diagnostic and therapeutic thoracentesis 1 L on 8/28.    Hospital Course:  Significant Events:  7/31 - 8/2 admit for afib w/ RVR and hypotension 8/11 - 8/19 admit for PNA w/ pericardial and pleural effusions  8/14 thoracentesis - 950cc drained c/w exudate  8/15 pericardiocentesis - 570cc drained  8/16 pericardial drain removed  8/27 third admission via  ED 8/28, thoracentesis in IR, 1 L of amber fluid drained 8/31 starting steroids 9/2: Left thoracentesis, 800 mL of amber fluid drained 9/3: Right thoracentesis   Subjective: -Feels okay overall, too worried to go home today, thinks she will be ready tomorrow, breathing is improving   Assessment & Plan:    Chronic atrial fibrillation with RVR -Required metoprolol and Cardizem gtt. initially, Cardizem weaned off, metoprolol dose increased, followed by cardiology this admission -Was briefly on IV heparin while needing thoracentesis, switched back to Eliquis yesterday   Recurrent bilateral pleural effusions, left >R -During prior admission on 8/14 had 1 L of exudative fluid drained, treated for pneumonia as well then, CT without evidence of mass or other concerning findings  -On 8/28, 1 L of fluid drained again, consistent with exudate, cultures are negative -Seen by pulmonary in consultation, concern for inflammatory etiology in the setting of high ESR, CRP and rheumatoid factor -Additional serologies ordered and pending at the time of discharge, she was started prednisone 40 mg daily on 8/31 -Repeat chest x-ray noted moderate to large bilateral effusions -Closely followed by pulmonary team, underwent left thoracentesis 9/2, 800 mL drained and right thoracentesis on 9/3 -pulm recommends to continue current dose of prednisone for about a month after which she can be tapered, also started on Bactrim for PCP prophylaxis -QuantiFERON gold negative -Completed 5-day course of cefepime -Also started on Lasix, dose decreased to 20 mg daily per cardiology recommendations due to long history of lower extremity edema, which may make above issue worse -Repeat chest x-ray today was stable -Discharged home with home health services, she has  a follow-up with cardiology next week and pulmonary next month   Pericardial effusion/pericarditis -Underwent pericardiocentesis on 8/15 for large pericardial  effusion, started on colchicine then  -Repeat echo with small pericardial effusion  -Continue colchicine, continue steroids, see discussion above    Tachycardia induced cardiomyopathy Suffered recent acute CHF episode felt to be due to tachycardia -was previously diagnosed and is usually on Lasix 40 mg daily -Lasix dose decreased to 20 mg daily by cardiology team   OSA on CPAP Continue usual CPAP dosing    Consultants:  [facility] Interventional radiology Pulmonology.    Procedures 8/28 ultrasound-guided left thoracentesis: 1 L drained 9/2 left thoracentesis -by pulmonary : 800 mL drained 9/3 right thoracentesis   Discharge Exam: Vitals:   01/29/21 2108 01/30/21 0526  BP: 122/71 124/83  Pulse: 77 64  Resp:  14  Temp:  98.1 F (36.7 C)  SpO2:  98%    General: AAOx3, no distress Cardiovascular: S1-S2, regular rate rhythm Respiratory: Improved air movement, slightly decreased at the bases  Discharge Instructions   Discharge Instructions     Diet - low sodium heart healthy   Complete by: As directed    Increase activity slowly   Complete by: As directed       Allergies as of 01/30/2021       Reactions   Azithromycin Other (See Comments)   Pain in arm with IV infusion   Condrolite [glucosamine-chondroitin-msm] Rash        Medication List     TAKE these medications    acetaminophen 325 MG tablet Commonly known as: TYLENOL Take 2 tablets (650 mg total) by mouth every 6 (six) hours as needed for moderate pain or mild pain.   CALCIUM CITRATE-VITAMIN D PO Take 1 tablet by mouth at bedtime.   carboxymethylcellulose 0.5 % Soln Commonly known as: REFRESH PLUS Place 1 drop into both eyes 2 (two) times daily.   cholecalciferol 25 MCG (1000 UNIT) tablet Commonly known as: VITAMIN D3 Take 1,000 Units by mouth daily.   colchicine 0.6 MG tablet Take 1 tablet (0.6 mg total) by mouth daily.   Eliquis 5 MG Tabs tablet Generic drug: apixaban TAKE 1  TABLET BY MOUTH TWICE A DAY What changed: how much to take   EpiPen 2-Pak 0.3 mg/0.3 mL Soaj injection Generic drug: EPINEPHrine Inject 0.3 mg into the muscle once as needed for anaphylaxis (severe allergic reaction).   furosemide 40 MG tablet Commonly known as: Lasix Take 0.5 tablets (20 mg total) by mouth daily. What changed: how much to take   metoprolol tartrate 25 MG tablet Commonly known as: LOPRESSOR Take 1.5 tablets (37.5 mg total) by mouth 2 (two) times daily. What changed: how much to take   multivitamin with minerals Tabs tablet Take 1 tablet by mouth every morning.   pantoprazole 40 MG tablet Commonly known as: PROTONIX Take 1 tablet (40 mg total) by mouth daily at 12 noon.   potassium chloride 20 MEQ packet Commonly known as: KLOR-CON Take 20 mEq by mouth daily. Start taking on: January 31, 2021   predniSONE 20 MG tablet Commonly known as: DELTASONE Take 2 tablets (40 mg total) by mouth daily with breakfast. Start taking on: January 31, 2021   PRESCRIPTION MEDICATION every 14 (fourteen) days. Allergy shots administered by Dr. Donneta Romberg   PRESERVISION AREDS PO Take 1 capsule by mouth 2 (two) times daily.   sulfamethoxazole-trimethoprim 800-160 MG tablet Commonly known as: BACTRIM DS Take 1 tablet by mouth 3 (three)  times a week. Start taking on: February 01, 2021       Allergies  Allergen Reactions   Azithromycin Other (See Comments)    Pain in arm with IV infusion    Condrolite [Glucosamine-Chondroitin-Msm] Rash    Follow-up Information     Jettie Booze, MD Follow up.   Specialties: Cardiology, Radiology, Interventional Cardiology Why: [facility] - keep follow-up as scheduled Wednesday Feb 06, 2021 at 3:20 PM (Arrive by 3:05 PM). Contact information: [address] [address] [place], [facility] Follow up.   Specialty: Home Health Services Why: Known now as  [facility], [facility]-office to call with visit times. Contact information: [place] [address] [phone]         Freddi Starr, MD Follow up on 03/06/2021.   Specialty: Pulmonary Disease Contact information: [address] [place] [address] [phone]                  The results of significant diagnostics from this hospitalization (including imaging, microbiology, ancillary and laboratory) are listed below for reference.    Significant Diagnostic Studies: DG Chest 1 View  Result Date: 01/26/2021 CLINICAL DATA:  Thoracentesis. EXAM: CHEST  1 VIEW COMPARISON:  Chest radiograph 01/25/2021. FINDINGS: Stable cardiac and mediastinal contours. Similar small left pleural effusion with underlying opacities. Slight interval decrease in size of small right pleural effusion. Persistent right lung base opacities. No pneumothorax. Thoracic spine degenerative changes. IMPRESSION: Persistent layering bilateral effusions and underlying atelectasis. No definite pneumothorax. Electronically Signed   By: Lovey Newcomer M.D.   On: 01/26/2021 11:18   DG Chest 1 View  Result Date: 01/20/2021 CLINICAL DATA:  Status post left thoracentesis. EXAM: CHEST  1 VIEW COMPARISON:  January 19, 2021. FINDINGS: No pneumothorax is noted. Left pleural effusion is slightly smaller status post thoracentesis. IMPRESSION: No pneumothorax status post left thoracentesis. Electronically Signed   By: Marijo Conception M.D.   On: 01/20/2021 12:38   DG Chest 1 View  Result Date: 01/06/2021 CLINICAL DATA:  Status post thoracentesis on the left. EXAM: CHEST  1 VIEW COMPARISON:  January 05, 2021 FINDINGS: The patient is status post thoracentesis on the left. A left effusion with underlying opacity remains but is smaller. No pneumothorax. A small right pleural effusion with underlying atelectasis remains. No other changes. IMPRESSION: No pneumothorax after  left thoracentesis. Bilateral pleural effusions remain with underlying atelectasis. Electronically Signed   By: Dorise Bullion III M.D.   On: 01/06/2021 13:26   DG Chest 2 View  Result Date: 01/24/2021 CLINICAL DATA:  Dyspnea R06.00 (ICD-10-CM) EXAM: CHEST - 2 VIEW COMPARISON:  01/22/2021. FINDINGS: Increased moderate to large left and moderate right pleural effusions. Mildly increased overlying opacities. No visible pneumothorax. No acute osseous abnormality. Cardiomediastinal silhouette is largely obscured. IMPRESSION: 1. Increased moderate to large left and moderate right pleural effusions. 2. Mildly increased overlying opacities, most likely atelectasis. Infection is not excluded. Electronically Signed   By: Margaretha Sheffield M.D.   On: 01/24/2021 13:08   DG Chest 2 View  Result Date: 01/18/2021 CLINICAL DATA:  80 year old female with a history of pericardial effusion EXAM: CHEST - 2 VIEW COMPARISON:  01/09/2021 FINDINGS: Cardiomediastinal silhouette likely unchanged with the heart borders partially obscured by overlying lung/pleural disease. Opacity at the left lung base persists obscuring left hemidiaphragm and the left heart border with associated meniscus. Decreased  opacity at the right lung base with obscuration of the right hemidiaphragm and the right heart border, with associated meniscus. Decreased interlobular septal thickening. IMPRESSION: Persistent bilateral left greater than right pleural effusion with associated atelectasis/consolidation Electronically Signed   By: Corrie Mckusick D.O.   On: 01/18/2021 15:06   CT Chest Wo Contrast  Result Date: 01/03/2021 CLINICAL DATA:  Chest pain or SOB, pleurisy or effusion suspected pleural effusion, concern for pna vs fluid EXAM: CT CHEST WITHOUT CONTRAST TECHNIQUE: Multidetector CT imaging of the chest was performed following the standard protocol without IV contrast. COMPARISON:  CT 12/23/2020. FINDINGS: Cardiovascular: Biatrial  enlargement.Increased, moderate size effusion.Normal size main and branch pulmonary arteries.Minimal thoracic aortic atherosclerotic calcifications. Mediastinum/Nodes: Unchanged calcified left hilar lymph node. No other lymphadenopathy.The thyroid is unremarkable.Esophagus is mildly dilated.The trachea is unremarkable. Lungs/Pleura: There is a moderate left and small right pleural effusions, increased on the left and new on the right comparison to prior exam. There is adjacent bibasilar and lingular atelectasis.Interlobular septal thickening ground-glass opacities.No suspicious pulmonary nodules or masses. No pneumothorax. Upper Abdomen: Calcified splenic granulomas.  No acute abnormality. Musculoskeletal: No acute osseous abnormality.There is a T6 vertebral body hemangioma. Bridging anterior fights and shin and fused sternum compatible with diffuse idiopathic skeletal hyperostosis. IMPRESSION: Moderate left and small right pleural effusions, which is increased on the left and new on the right in comparison to prior CT. Adjacent bibasilar and lingular atelectasis. Increased, moderate-sized pericardial effusion. Biatrial enlargement. Mild pulmonary edema. Electronically Signed   By: Maurine Simmering M.D.   On: 01/03/2021 13:22   CARDIAC CATHETERIZATION  Result Date: 01/07/2021 Conclusions: Successful pericardiocentesis and pericardial drain placement via the subxiphoid approach, yielding 570 mL of serosanguinous fluid. Recommendations: Repeat limited echo tomorrow to ensure pericardial effusion is not reaccumulating. Maintain pericardial drain to vacuum drain placed in cath lab; record output every 4 hours. Obtain portable chest radiograph. Nelva Bush, MD [facility]  DG CHEST PORT 1 VIEW  Result Date: 01/30/2021 CLINICAL DATA:  Cough, shortness of breath, pleural effusion EXAM: PORTABLE CHEST 1 VIEW COMPARISON:  Chest radiograph 01/28/2021 FINDINGS: The cardiomediastinal silhouette is stable. Again seen  are left larger than right pleural effusions. The right effusion has slightly decreased in size with improved aeration of the right base. The left pleural effusion and adjacent airspace disease are not significantly changed. The upper lungs remain well aerated. There is no new focal airspace opacity. There is no pneumothorax. There is no acute osseous abnormality. IMPRESSION: 1. Slight interval decrease in size of the right pleural effusion with improved aeration of the right base. 2. No significant interval change of the larger left pleural effusion and adjacent airspace disease. Electronically Signed   By: Valetta Mole M.D.   On: 01/30/2021 08:54   DG Chest Port 1 View  Result Date: 01/28/2021 CLINICAL DATA:  Status post thoracentesis EXAM: PORTABLE CHEST 1 VIEW COMPARISON:  01/26/2021 chest radiograph. FINDINGS: Stable cardiomediastinal silhouette with mild cardiomegaly. No pneumothorax. Small bilateral pleural effusions, similar. No overt pulmonary edema. Mild-to-moderate bibasilar hazy lung opacities, favor atelectasis, similar. IMPRESSION: No pneumothorax. Stable small bilateral pleural effusions and bibasilar hazy lung opacities, favor atelectasis. Electronically Signed   By: Ilona Sorrel M.D.   On: 01/28/2021 07:32   DG CHEST PORT 1 VIEW  Result Date: 01/25/2021 CLINICAL DATA:  Post left thoracentesis EXAM: PORTABLE CHEST 1 VIEW COMPARISON:  01/24/2021 FINDINGS: Decreased left pleural effusion following thoracentesis. No pneumothorax. Small left pleural effusion remains. Improved aeration left lung base Moderate right  pleural effusion right lower lobe atelectasis unchanged. IMPRESSION: Non complicated left thoracentesis. Electronically Signed   By: Franchot Gallo M.D.   On: 01/25/2021 14:47   DG CHEST PORT 1 VIEW  Result Date: 01/22/2021 CLINICAL DATA:  Pleural effusion EXAM: PORTABLE CHEST 1 VIEW COMPARISON:  01/21/2021 FINDINGS: Persistent bilateral pleural effusions with adjacent atelectasis.  Appearance is similar to the prior study. Similar cardiomediastinal contours. IMPRESSION: Similar bilateral pleural effusions with adjacent atelectasis. Electronically Signed   By: Macy Mis M.D.   On: 01/22/2021 09:28   DG Chest Port 1 View  Result Date: 01/21/2021 CLINICAL DATA:  Pleural effusion. EXAM: PORTABLE CHEST 1 VIEW COMPARISON:  January 20, 2021. FINDINGS: Stable cardiomediastinal silhouette. No pneumothorax is noted. Bilateral pleural effusions are noted with associated bibasilar atelectasis. Bony thorax is unremarkable. IMPRESSION: Stable bilateral pleural effusions with associated atelectasis. Electronically Signed   By: Marijo Conception M.D.   On: 01/21/2021 08:08   DG Chest Port 1 View  Result Date: 01/19/2021 CLINICAL DATA:  Irregular heartbeat/pleural effusion EXAM: PORTABLE CHEST 1 VIEW COMPARISON:  Chest x-ray 01/18/2021 FINDINGS: The heart and mediastinal contours are unchanged. Aortic calcification. Retrocardiac consolidation not excluded. No pulmonary edema. Similar-appearing at least moderate volume left pleural effusion. Interval increase in at least small volume right pleural effusion. No pneumothorax. No acute osseous abnormality. IMPRESSION: 1. Similar-appearing at least moderate volume left pleural effusion. 2. Interval increase in at least small volume right pleural effusion. Electronically Signed   By: Iven Finn M.D.   On: 01/19/2021 18:24   DG Chest Port 1 View  Result Date: 01/09/2021 CLINICAL DATA:  Pleural effusion. EXAM: PORTABLE CHEST 1 VIEW COMPARISON:  01/07/2021 FINDINGS: Stable cardiomediastinal contours. Moderate to large left pleural effusion and moderate right pleural effusion are unchanged compared with the previous exam. There is overlying atelectasis. Pulmonary vascular congestion is similar. IMPRESSION: 1. No change in bilateral pleural effusions, left greater than right. 2. Stable pulmonary vascular congestion. Electronically Signed   By: Kerby Moors M.D.   On: 01/09/2021 10:38   DG CHEST PORT 1 VIEW  Result Date: 01/07/2021 CLINICAL DATA:  Status post pericardiocentesis. EXAM: PORTABLE CHEST 1 VIEW COMPARISON:  Radiograph yesterday.  CT 01/03/2021 FINDINGS: Enlargement of the cardiopericardial silhouette persists, however slight decreased globular component likely sequela of pericardiocentesis. There is a presumed pericardial drain in place. Persistent bilateral pleural effusions and atelectasis. No pneumothorax. IMPRESSION: 1. Persistent enlargement of the cardiopericardial silhouette, however slight decreased globular appearance likely related to pericardiocentesis. Presumed pericardial drain in place. 2. Persistent bilateral pleural effusions and atelectasis. Electronically Signed   By: Keith Rake M.D.   On: 01/07/2021 18:56   DG Chest Port 1 View  Result Date: 01/05/2021 CLINICAL DATA:  Shortness of breath.  Evaluate pleural effusion. EXAM: PORTABLE CHEST 1 VIEW COMPARISON:  January 03, 2021 FINDINGS: The cardiomediastinal silhouette is stable. No pneumothorax. There is a small layering effusion on the right with underlying opacity. There is a moderate to large effusion on the left which is a little larger in the interval with underlying opacity. Mild interstitial prominence. IMPRESSION: 1. There is a moderate to large pleural effusion on the left which is worse in the interval and a small pleural effusion on the right which was not seen previously. Opacities underlying the effusions may represent atelectasis but are nonspecific, particularly on the left. 2. Increasing interstitial opacities suggestive of pulmonary venous congestion/mild edema. Electronically Signed   By: Dorise Bullion III M.D.   On: 01/05/2021  13:37   DG Chest Portable 1 View  Result Date: 01/03/2021 CLINICAL DATA:  Weakness EXAM: PORTABLE CHEST 1 VIEW COMPARISON:  12/23/2020 FINDINGS: Heart size appears enlarged, although is largely obscured. Atherosclerotic  calcification of the aortic knob. Large left pleural effusion with left basilar opacity. Right lung appears clear. No pneumothorax. IMPRESSION: Large left pleural effusion with left basilar opacity, which may represent atelectasis and/or pneumonia. Electronically Signed   By: Davina Poke D.O.   On: 01/03/2021 10:20   ECHOCARDIOGRAM COMPLETE  Result Date: 01/05/2021    ECHOCARDIOGRAM REPORT   Patient Name:   DORANN DAVIDSON Date of Exam: 01/04/2021 Medical Rec #:  [id number]     Height:       62.0 in Accession #:    [id number]    Weight:       190.2 lb Date of Birth:  09/12/1940     BSA:          1.871 m Patient Age:    1 years      BP:           112/63 mmHg Patient Gender: F             HR:           89 bpm. Exam Location:  Inpatient Procedure: 2D Echo, Cardiac Doppler and Color Doppler Indications:    I31.3 Pericardial effusion (noninflammatory)  History:        Patient has prior history of Echocardiogram examinations, most                 recent 12/24/2020. Arrythmias:Atrial Fibrillation.  Sonographer:    Bernadene Person RDCS Referring Phys: [id number] Margie Billet IMPRESSIONS  1. Left ventricular ejection fraction, by estimation, is 65 to 70%. The left ventricle has normal function. The left ventricle has no regional wall motion abnormalities. There is mild left ventricular hypertrophy. Left ventricular diastolic function could not be evaluated.  2. Right ventricular systolic function is normal. The right ventricular size is normal. There is normal pulmonary artery systolic pressure. The estimated right ventricular systolic pressure is 48.0 mmHg.  3. Effusion is mostly posterior to the LV - measuring up to 3.7 cm. Large pericardial effusion. The pericardial effusion is circumferential. There is no evidence of cardiac tamponade.  4. The mitral valve is grossly normal. No evidence of mitral valve regurgitation.  5. The aortic valve is tricuspid. Aortic valve regurgitation is not visualized.  6. The inferior vena cava  is dilated in size with >50% respiratory variability, suggesting right atrial pressure of 8 mmHg. Comparison(s): Changes from prior study are noted. 12/24/2020: LVEF 50-55%, moderate pericardial effusion up to 1 cm - LV posterior wall, no tamponade. FINDINGS  Left Ventricle: Left ventricular ejection fraction, by estimation, is 65 to 70%. The left ventricle has normal function. The left ventricle has no regional wall motion abnormalities. The left ventricular internal cavity size was normal in size. There is  mild left ventricular hypertrophy. Left ventricular diastolic function could not be evaluated due to atrial fibrillation. Left ventricular diastolic function could not be evaluated. Right Ventricle: The right ventricular size is normal. No increase in right ventricular wall thickness. Right ventricular systolic function is normal. There is normal pulmonary artery systolic pressure. The tricuspid regurgitant velocity is 1.61 m/s, and  with an assumed right atrial pressure of 8 mmHg, the estimated right ventricular systolic pressure is 16.5 mmHg. Left Atrium: Left atrial size was normal in size. Right Atrium: Right atrial size was normal  in size. Pericardium: Effusion is mostly posterior to the LV - measuring up to 3.7 cm. A large pericardial effusion is present. The pericardial effusion is circumferential. There is no evidence of cardiac tamponade. Mitral Valve: The mitral valve is grossly normal. No evidence of mitral valve regurgitation. Tricuspid Valve: The tricuspid valve is not well visualized. Tricuspid valve regurgitation is trivial. Aortic Valve: The aortic valve is tricuspid. Aortic valve regurgitation is not visualized. Pulmonic Valve: The pulmonic valve was grossly normal. Pulmonic valve regurgitation is not visualized. Aorta: The aortic root and ascending aorta are structurally normal, with no evidence of dilitation. Venous: The inferior vena cava is dilated in size with greater than 50% respiratory  variability, suggesting right atrial pressure of 8 mmHg. IAS/Shunts: No atrial level shunt detected by color flow Doppler.  LEFT VENTRICLE PLAX 2D LVIDd:         3.90 cm LVIDs:         2.50 cm LV PW:         1.10 cm LV IVS:        1.10 cm LVOT diam:     2.10 cm LV SV:         52 LV SV Index:   28 LVOT Area:     3.46 cm  RIGHT VENTRICLE RV S prime:     9.90 cm/s TAPSE (M-mode): 1.5 cm LEFT ATRIUM             Index       RIGHT ATRIUM           Index LA diam:        3.60 cm 1.92 cm/m  RA Area:     17.90 cm LA Vol (A2C):   42.0 ml 22.44 ml/m RA Volume:   39.60 ml  21.16 ml/m LA Vol (A4C):   42.4 ml 22.66 ml/m LA Biplane Vol: 43.4 ml 23.19 ml/m  AORTIC VALVE LVOT Vmax:   92.00 cm/s LVOT Vmean:  57.300 cm/s LVOT VTI:    0.151 m  AORTA Ao Root diam: 3.10 cm Ao Asc diam:  3.00 cm TRICUSPID VALVE TR Peak grad:   10.4 mmHg TR Vmax:        161.00 cm/s  SHUNTS Systemic VTI:  0.15 m Systemic Diam: 2.10 cm Lyman Bishop MD Electronically signed by Lyman Bishop MD Signature Date/Time: 01/05/2021/12:15:27 PM    Final    ECHOCARDIOGRAM LIMITED  Result Date: 01/20/2021    ECHOCARDIOGRAM LIMITED REPORT   Patient Name:   DHRITI FALES Date of Exam: 01/20/2021 Medical Rec #:  [id number]     Height:       62.0 in Accession #:    [id number]    Weight:       185.5 lb Date of Birth:  1940/09/28     BSA:          1.852 m Patient Age:    36 years      BP:           104/58 mmHg Patient Gender: F             HR:           104 bpm. Exam Location:  Inpatient Procedure: Limited Echo, Cardiac Doppler and Color Doppler Indications:    I31.3 Pericardial effusion (noninflammatory)  History:        Patient has prior history of Echocardiogram examinations, most                 recent  01/08/2021. Abnormal ECG; Arrythmias:Atrial Fibrillation                 and Bradycardia. Pericardial effusion. Post pericardiocentesis.  Sonographer:    Roseanna Rainbow RDCS Referring Phys: [id number] [facility]  Sonographer Comments: Technically difficult study  due to poor echo windows. Image acquisition challenging due to patient body habitus. Patient in high Fowler's position due to dyspnea. IMPRESSIONS  1. Left ventricular ejection fraction, by estimation, is 60 to 65%. The left ventricle has normal function. The left ventricle has no regional wall motion abnormalities.  2. A small pericardial effusion is present. There is no evidence of cardiac tamponade.  3. The inferior vena cava is normal in size with greater than 50% respiratory variability, suggesting right atrial pressure of 3 mmHg. Comparison(s): No significant change from prior study. Conclusion(s)/Recommendation(s): Limited study to assess pericardial effusion. Effusion is small, most prominent near posterior LV but seen adjacent to RV as well. IVC is small and collapses. No echo evidence of tamponade. Pericardial effusion unchanged from study dated 01/08/21. Pleural effusion smaller (current echo done post thoracentesis). Mild ascites noted as well. FINDINGS  Left Ventricle: Left ventricular ejection fraction, by estimation, is 60 to 65%. The left ventricle has normal function. The left ventricle has no regional wall motion abnormalities. The left ventricular internal cavity size was normal in size. Pericardium: A small pericardial effusion is present. There is no evidence of cardiac tamponade. Venous: The inferior vena cava is normal in size with greater than 50% respiratory variability, suggesting right atrial pressure of 3 mmHg. Additional Comments: There is a small pleural effusion in both left and right lateral regions. Mild ascites is present.  LV Volumes (MOD) LV vol d, MOD A2C: 67.4 ml LV vol d, MOD A4C: 52.9 ml LV vol s, MOD A2C: 22.0 ml LV vol s, MOD A4C: 19.4 ml LV SV MOD A2C:     45.4 ml LV SV MOD A4C:     52.9 ml LV SV MOD BP:      39.6 ml IVC IVC diam: 1.70 cm Buford Dresser MD Electronically signed by Buford Dresser MD Signature Date/Time: 01/20/2021/2:27:20 PM    Final     ECHOCARDIOGRAM LIMITED  Result Date: 01/08/2021    ECHOCARDIOGRAM LIMITED REPORT   Patient Name:   MUMTAZ LOVINS Date of Exam: 01/08/2021 Medical Rec #:  [id number]     Height:       62.0 in Accession #:    [id number]    Weight:       189.6 lb Date of Birth:  [birth year]     BSA:          1.869 m Patient Age:    19 years      BP:           140/67 mmHg Patient Gender: F             HR:           85 bpm. Exam Location:  Inpatient Procedure: Limited Echo and Limited Color Doppler Indications:    Pericardial effusion follow up  History:        Patient has prior history of Echocardiogram examinations, most                 recent 01/07/2021. 01/07/21 Pericardiocentesis.  Sonographer:    Luisa Hart RDCS Referring Phys: [place]  1. Left ventricular ejection fraction, by estimation, is 60 to 65%. The left ventricle has normal function. The  left ventricle has no regional wall motion abnormalities.  2. Right ventricular systolic function is normal. The right ventricular size is normal. There is normal pulmonary artery systolic pressure.  3. Post pericardiocentesis with no reaccumulation Persistent large left pleural effusion . The pericardial effusion is posterior to the left ventricle. Large pleural effusion in the left lateral region.  4. The mitral valve is abnormal. Trivial mitral valve regurgitation. No evidence of mitral stenosis.  5. The aortic valve is tricuspid. There is mild calcification of the aortic valve. Aortic valve regurgitation is not visualized. Mild aortic valve sclerosis is present, with no evidence of aortic valve stenosis.  6. The inferior vena cava is normal in size with greater than 50% respiratory variability, suggesting right atrial pressure of 3 mmHg. FINDINGS  Left Ventricle: Left ventricular ejection fraction, by estimation, is 60 to 65%. The left ventricle has normal function. The left ventricle has no regional wall motion abnormalities. The left ventricular internal  cavity size was normal in size. There is  no left ventricular hypertrophy. Right Ventricle: The right ventricular size is normal. No increase in right ventricular wall thickness. Right ventricular systolic function is normal. There is normal pulmonary artery systolic pressure. The tricuspid regurgitant velocity is 2.40 m/s, and  with an assumed right atrial pressure of 3 mmHg, the estimated right ventricular systolic pressure is 82.8 mmHg. Left Atrium: Left atrial size was normal in size. Right Atrium: Right atrial size was normal in size. Pericardium: Post pericardiocentesis with no reaccumulation Persistent large left pleural effusion. Trivial pericardial effusion is present. The pericardial effusion is posterior to the left ventricle. Mitral Valve: The mitral valve is abnormal. There is mild thickening of the mitral valve leaflet(s). There is mild calcification of the mitral valve leaflet(s). Trivial mitral valve regurgitation. No evidence of mitral valve stenosis. Tricuspid Valve: The tricuspid valve is not assessed. Tricuspid valve regurgitation is not demonstrated. No evidence of tricuspid stenosis. Aortic Valve: The aortic valve is tricuspid. There is mild calcification of the aortic valve. Aortic valve regurgitation is not visualized. Mild aortic valve sclerosis is present, with no evidence of aortic valve stenosis. Pulmonic Valve: The pulmonic valve was not assessed. Pulmonic valve regurgitation is not visualized. No evidence of pulmonic stenosis. Aorta: The aortic root is normal in size and structure. Venous: The inferior vena cava is normal in size with greater than 50% respiratory variability, suggesting right atrial pressure of 3 mmHg. IAS/Shunts: No atrial level shunt detected by color flow Doppler. Additional Comments: There is a large pleural effusion in the left lateral region. LEFT ATRIUM           Index LA Vol (A4C): 35.3 ml 18.89 ml/m  MV E velocity: 88.70 cm/s  TRICUSPID VALVE                             TR Peak grad:   23.0 mmHg                            TR Vmax:        240.00 cm/s Jenkins Rouge MD Electronically signed by Jenkins Rouge MD Signature Date/Time: 01/08/2021/12:36:44 PM    Final    ECHOCARDIOGRAM LIMITED  Result Date: 01/07/2021    ECHOCARDIOGRAM LIMITED REPORT   Patient Name:   LUVERTA KORTE Date of Exam: 01/07/2021 Medical Rec #:  [id number]     Height:  62.0 in Accession #:    [id number]    Weight:       190.6 lb Date of Birth:  May 21, 1941     BSA:          1.873 m Patient Age:    47 years      BP:           142/69 mmHg Patient Gender: F             HR:           129 bpm. Exam Location:  Inpatient Procedure: Limited Echo Indications:    Pericardial effusion                 Pericardiocentesis  History:        Patient has prior history of Echocardiogram examinations, most                 recent 01/06/2021. Arrythmias:Atrial Fibrillation; Risk                 Factors:Sleep Apnea.  Sonographer:    Clayton Lefort RDCS (AE) Referring Phys: [id number] [facility]  Sonographer Comments: Technically difficult study due to poor echo windows. IMPRESSIONS  1. Left ventricular ejection fraction, by estimation, is 65 to 70%. The left ventricle has normal function. Small LV prior to procedure. Post procedure LV size is normal.  2. Large pericardial effusion. The pericardial effusion is anterior to the right ventricle. Through study pericardial effusion moves from large to small. No evidence of RV collapse at end of study. Limited study for pericardiocentesis. Comparison(s): A prior study was performed on 01/06/21. Similar effusion prior to procedure. FINDINGS  Left Ventricle: Left ventricular ejection fraction, by estimation, is 65 to 70%. The left ventricle has normal function. The left ventricular internal cavity size was small. Pericardium: A large pericardial effusion is present. The pericardial effusion is anterior to the right ventricle. Rudean Haskell MD Electronically signed by Rudean Haskell MD Signature Date/Time: 01/07/2021/5:10:12 PM    Final    ECHOCARDIOGRAM LIMITED  Result Date: 01/06/2021    ECHOCARDIOGRAM LIMITED REPORT   Patient Name:   LESTER PLATAS Date of Exam: 01/06/2021 Medical Rec #:  [id number]     Height:       62.0 in Accession #:    [id number]    Weight:       191.9 lb Date of Birth:  15-Mar-1941     BSA:          1.878 m Patient Age:    1 years      BP:           94/62 mmHg Patient Gender: F             HR:           97 bpm. Exam Location:  Inpatient Procedure: 2D Echo STAT ECHO Indications:    pericardial effusion  History:        Patient has prior history of Echocardiogram examinations, most                 recent 01/04/2021. Arrythmias:Atrial Fibrillation; Risk                 Factors:Sleep Apnea.  Sonographer:    Johny Chess RDCS Referring Phys: [place]  1. Left ventricular ejection fraction, by estimation, is 65 to 70%. The left ventricle has normal function. The left ventricle has no regional wall motion abnormalities.  2. Right  ventricular systolic function is normal. The right ventricular size is mildly enlarged.  3. Large pericardial effusion. The pericardial effusion is circumferential. There is no evidence of cardiac tamponade.  4. The inferior vena cava is normal in size with <50% respiratory variability, suggesting right atrial pressure of 8 mmHg. Comparison(s): Changes from prior study are noted. 01/04/2021: LVEF 65-70%, large pericardial effusion up to 3.7 cm, IVC collapsed, no tamponade physiology. Compared to the prior study 2 days ago, the pericardial effusion is stable - the LV cavity is small and likely preload dependent -the IVC collapses - there are a few features such as diastolic collapse noted and TV inflow variation, however, I suspect this is more respiratory given her large pleural effusion. FINDINGS  Left Ventricle: Left ventricular ejection fraction, by estimation, is 65 to 70%. The left ventricle has normal  function. The left ventricle has no regional wall motion abnormalities. Right Ventricle: The right ventricular size is mildly enlarged. Right ventricular systolic function is normal. Pericardium: A large pericardial effusion is present. The pericardial effusion is circumferential. The pericardial effusion appears to contain focal strands and fibrous material. There is diastolic collapse of the right ventricular free wall and excessive respiratory variation in the tricuspid valve spectral Doppler velocities. There is no evidence of cardiac tamponade. Venous: The inferior vena cava is normal in size with less than 50% respiratory variability, suggesting right atrial pressure of 8 mmHg. Lyman Bishop MD Electronically signed by Lyman Bishop MD Signature Date/Time: 01/06/2021/11:22:08 AM    Final    US THORACENTESIS ASP PLEURAL SPACE W/IMG GUIDE  Result Date: 01/20/2021 EXAM: ULTRASOUND GUIDED LEFT THORACENTESIS MEDICATIONS: 1% plain lidocaine, 5 mL COMPLICATIONS: None immediate. PROCEDURE: An ultrasound guided thoracentesis was thoroughly discussed with the patient and questions answered. The benefits, risks, alternatives and complications were also discussed. The patient understands and wishes to proceed with the procedure. Written consent was obtained. Ultrasound was performed to localize and mark an adequate pocket of fluid in the left chest. The area was then prepped and draped in the normal sterile fashion. 1% Lidocaine was used for local anesthesia. Under ultrasound guidance a 6 Fr Safe-T-Centesis catheter was introduced. Thoracentesis was performed. The catheter was removed and a dressing applied. FINDINGS: A total of approximately 1 L of clear, amber/blood-tinged fluid was removed. Samples were sent to the laboratory as requested by the clinical team. IMPRESSION: Successful ultrasound guided left thoracentesis yielding 1 L of pleural fluid. Read by: Ascencion Dike PA-C Electronically Signed   By: Jacqulynn Cadet M.D.   On: 01/20/2021 11:43   US THORACENTESIS ASP PLEURAL SPACE W/IMG GUIDE  Result Date: 01/06/2021 INDICATION: Patient with a history of permanent atrial fibrillation admitted for AFib with RVR and found to have bilateral pleural effusions as well as a pericardial effusion. Interventional radiology asked to perform a diagnostic and therapeutic thoracentesis. EXAM: ULTRASOUND GUIDED THORACENTESIS MEDICATIONS: 1% lidocaine 10 mL COMPLICATIONS: None immediate. PROCEDURE: An ultrasound guided thoracentesis was thoroughly discussed with the patient and questions answered. The benefits, risks, alternatives and complications were also discussed. The patient understands and wishes to proceed with the procedure. Written consent was obtained. Ultrasound was performed to localize and mark an adequate pocket of fluid in the left chest. The area was then prepped and draped in the normal sterile fashion. 1% Lidocaine was used for local anesthesia. Under ultrasound guidance a 6 Fr Safe-T-Centesis catheter was introduced. Thoracentesis was performed. The catheter was removed and a dressing applied. FINDINGS: A total of approximately 950 mL  of red/amber fluid was removed. Samples were sent to the laboratory as requested by the clinical team. IMPRESSION: Successful ultrasound guided left thoracentesis yielding 950 mL of pleural fluid. Read by: Soyla Dryer, NP Electronically Signed   By: Corrie Mckusick D.O.   On: 01/06/2021 13:39    Microbiology: Recent Results (from the past 240 hour(s))  Body fluid culture w Gram Stain     Status: None   Collection Time: 01/20/21 11:36 AM   Specimen: PATH Cytology Pleural fluid  Result Value Ref Range Status   Specimen Description PLEURAL FLUID  Final   Special Requests CYTO  Final   Gram Stain   Final    FEW WBC PRESENT,BOTH PMN AND MONONUCLEAR NO ORGANISMS SEEN    Culture   Final    NO GROWTH 3 DAYS Performed at [facility], [address]  [place], [place] [address]    Report Status 01/23/2021 FINAL  Final  Body fluid culture w Gram Stain     Status: None   Collection Time: 01/25/21  1:57 PM   Specimen: Pleural Fluid  Result Value Ref Range Status   Specimen Description FLUID PLEURAL  Final   Special Requests NONE  Final   Gram Stain   Final    RARE WBC PRESENT, PREDOMINANTLY PMN NO ORGANISMS SEEN    Culture   Final    NO GROWTH 3 DAYS Performed at [facility], [place] [address], [place], [place] [address]    Report Status 01/29/2021 FINAL  Final  Body fluid culture w Gram Stain     Status: None   Collection Time: 01/26/21  9:53 AM   Specimen: Pleural Fluid  Result Value Ref Range Status   Specimen Description FLUID  Final   Special Requests NONE  Final   Gram Stain   Final    FEW WBC PRESENT, PREDOMINANTLY MONONUCLEAR NO ORGANISMS SEEN    Culture   Final    NO GROWTH 3 DAYS Performed at [facility], [place] [address] [place], [place] [address]    Report Status 01/29/2021 FINAL  Final     Labs: Basic Metabolic Panel: Recent Labs  Lab 01/25/21 0219 01/25/21 0816 01/27/21 0140 01/28/21 0107 01/29/21 0236 01/30/21 0222  NA 139  --  139 137 136 135  K 4.2  --  3.5 3.7 4.1 4.1  CL 106  --  109 105 106 101  CO2 25  --  26 26 18* 28  GLUCOSE 119*  --  134* 120* 127* 124*  BUN 23  --  26* 24* 27* 26*  CREATININE 0.60  --  0.59 0.65 0.80 0.86  CALCIUM 8.7*  --  8.3* 8.3* 8.4* 8.5*  MG  --  2.0  --   --   --   --    Liver Function Tests: No results for input(s): AST, ALT, ALKPHOS, BILITOT, PROT, ALBUMIN in the last 168 hours. No results for input(s): LIPASE, AMYLASE in the last 168 hours. No results for input(s): AMMONIA in the last 168 hours. CBC: Recent Labs  Lab 01/26/21 0134 01/27/21 0140 01/28/21 0107 01/29/21 0236 01/30/21 0222  WBC 7.9 7.6 9.6 11.6* 12.0*  HGB 11.8* 11.9* 12.1 12.8 13.2  HCT 35.6* 37.0 36.9 41.1 39.7  MCV 88.3 89.6 88.5 93.4 87.8  PLT 331 350 341 300 347    Cardiac Enzymes: No results for input(s): CKTOTAL, CKMB, CKMBINDEX, TROPONINI in the last 168 hours. BNP: BNP (last 3 results)  Recent Labs    01/03/21 0951 01/19/21 1803 01/23/21 0553  BNP 108.1* 102.3* 53.4    ProBNP (last 3 results) No results for input(s): PROBNP in the last 8760 hours.  CBG: No results for input(s): GLUCAP in the last 168 hours.     Signed:  Domenic Polite MD.  [facility] 01/30/2021, 10:28 AM

## 2021-01-30 NOTE — Progress Notes (Signed)
Physical Therapy Treatment Patient Details Name: Rebecca Short MRN: [id number] DOB: [birth year] Today's Date: 01/30/2021    History of Present Illness Pt is an 80yo female presenting to [facility] ED on 8/27 with increasing weakness and SOB. Recently discharged after bilateral pleural/pericardial effusions and returned  secondary to continued symptoms.  Thoracocentsis performed 8/28 with 1L drained. S/p throacentesis performed again 01/25/21 ith 800 cc's drained from L pleural space. PMH: afib with RVR, OSA, leukocytosis, macular degeneration.    PT Comments    Focused session on advancing gait distance/tolerance and practicing stairs. She was able to ambulate up to ~300 ft with SUP and a RW today. Pt displays quads weakness and balance deficits that impact her safety with stair negotiation. She displays intermittent knee buckling while navigating stairs and needs bil UE support (1 on rail and 1 HHA for home set-up) and minA to prevent/recover bouts of LOB. Educated pt on need for assistance on stairs with her verbalizing understanding. Will continue to follow acutely. Current recommendations remain appropriate.    Follow Up Recommendations  Home health PT;Supervision/Assistance - 24 hour (Continue HHPT at d/c.)     Equipment Recommendations  None recommended by PT (Pt has required DME.)    Recommendations for Other Services       Precautions / Restrictions Precautions Precautions: Fall Precaution Comments: Monitor HR and O2 Restrictions Weight Bearing Restrictions: No    Mobility  Bed Mobility Overal bed mobility: Modified Independent Bed Mobility: Supine to Sit;Sit to Supine     Supine to sit: HOB elevated;Modified independent (Device/Increase time) Sit to supine: HOB elevated;Modified independent (Device/Increase time)   General bed mobility comments: Pt able to transition supine <> sit with mod I and extra time with HOB elevated.    Transfers Overall transfer level: Needs  assistance Equipment used: Rolling walker (2 wheeled) Transfers: Sit to/from Stand Sit to Stand: Supervision         General transfer comment: Improved initiation to stand with success on first attempt from EOB, supervision for safety.  Ambulation/Gait Ambulation/Gait assistance: Supervision Gait Distance (Feet): 300 Feet Assistive device: Rolling walker (2 wheeled) Gait Pattern/deviations: Step-through pattern;Decreased stride length;Trunk flexed Gait velocity: Decreased Gait velocity interpretation: <1.31 ft/sec, indicative of household ambulator General Gait Details: Supervision for safety, no LOB. Pt ambulates at slow pace and trunk flexed, needing cues intermittently to correct posture and keep proximal to RW. Educated pt to take standing rest breaks to rest as needed to allow pt to progress her distance.   Stairs Stairs: Yes Stairs assistance: Min assist Stair Management: One rail Right;One rail Left;Step to pattern;Forwards Number of Stairs: 3 General stair comments: Ascends with L rail and R HHA and descends with R rail and L HHA, minA for stability both directions as pt displays knee weakness resulting in x1 knee buckle while descending in which pt recovered with minA. Cues provided to lean anteriorly and improve upright posture when ascending and to wait until knee is stable before advancing other leg when descending. Educated pt to have someone transfer her RW up/down stairs and then return to assist her holding her hand and holding onto her due to her instability with stairs, pt verbalized understanding.   Wheelchair Mobility    Modified Rankin (Stroke Patients Only)       Balance Overall balance assessment: Needs assistance Sitting-balance support: No upper extremity supported;Feet supported Sitting balance-Leahy Scale: Good     Standing balance support: Bilateral upper extremity supported;During functional activity Standing balance-Leahy Scale: Poor Standing  balance comment: Pt reliant on BUE support for mobility.                            Cognition Arousal/Alertness: Awake/alert Behavior During Therapy: WFL for tasks assessed/performed Overall Cognitive Status: Within Functional Limits for tasks assessed                                        Exercises      General Comments General comments (skin integrity, edema, etc.): HR up to as high as 128 bpm      Pertinent Vitals/Pain Pain Assessment: Faces Faces Pain Scale: No hurt Pain Intervention(s): Monitored during session    Home Living                      Prior Function            PT Goals (current goals can now be found in the care plan section) Acute Rehab PT Goals Patient Stated Goal: to go home today PT Goal Formulation: With patient Time For Goal Achievement: 02/07/21 Potential to Achieve Goals: Good Progress towards PT goals: Progressing toward goals    Frequency    Min 3X/week      PT Plan Current plan remains appropriate    Co-evaluation              AM-PAC PT "6 Clicks" Mobility   Outcome Measure  Help needed turning from your back to your side while in a flat bed without using bedrails?: None Help needed moving from lying on your back to sitting on the side of a flat bed without using bedrails?: None Help needed moving to and from a bed to a chair (including a wheelchair)?: A Little Help needed standing up from a chair using your arms (e.g., wheelchair or bedside chair)?: A Little Help needed to walk in hospital room?: A Little Help needed climbing 3-5 steps with a railing? : A Little 6 Click Score: 20    End of Session Equipment Utilized During Treatment: Gait belt Activity Tolerance: Patient tolerated treatment well Patient left: with call bell/phone within reach;in bed;with bed alarm set   PT Visit Diagnosis: Unsteadiness on feet (R26.81);History of falling (Z91.81);Muscle weakness (generalized)  (M62.81);Other abnormalities of gait and mobility (R26.89);Difficulty in walking, not elsewhere classified (R26.2)     Time: 3151-7616 PT Time Calculation (min) (ACUTE ONLY): 22 min  Charges:  $Gait Training: 8-22 mins                     Raymond Gurney, PT, DPT Acute Rehabilitation Services  Pager: [phone] Office: [phone]    Jewel Baize 01/30/2021, 11:25 AM

## 2021-01-30 NOTE — Progress Notes (Signed)
Occupational Therapy Treatment Patient Details Name: Rebecca Short MRN: [id number] DOB: [birth year] Today's Date: 01/30/2021    History of present illness Pt is an 80yo female presenting to [facility] ED on 8/27 with increasing weakness and SOB. Recently discharged after bilateral pleural/pericardial effusions and returned  secondary to continued symptoms.  Thoracocentsis performed 8/28 with 1L drained. S/p throacentesis performed again 01/25/21 ith 800 cc's drained from L pleural space. PMH: afib with RVR, OSA, leukocytosis, macular degeneration.   OT comments  Pt progressing towards OT goals this session. Pt declined offer of practicing tub transfer - stating she would prefer to do this at home with [facility] therapy. She was overall supervision level for standing grooming tasks, toilet transfers and peri care, as well as bed mobility and ambulation in room with RW. Educated on energy conservation education/strategies for ADL. POC remains appropriate. HHOT post-acute remains appropriate at this time.    Follow Up Recommendations  Home health OT;Supervision - Intermittent    Equipment Recommendations  None recommended by OT    Recommendations for Other Services      Precautions / Restrictions Precautions Precautions: Fall Precaution Comments: Monitor HR and O2 Restrictions Weight Bearing Restrictions: No       Mobility Bed Mobility Overal bed mobility: Modified Independent Bed Mobility: Supine to Sit;Sit to Supine     Supine to sit: HOB elevated;Modified independent (Device/Increase time) Sit to supine: HOB elevated;Modified independent (Device/Increase time)   General bed mobility comments: Pt able to transition supine <> sit with mod I and extra time with HOB elevated.    Transfers Overall transfer level: Needs assistance Equipment used: Rolling walker (2 wheeled) Transfers: Sit to/from Stand Sit to Stand: Supervision         General transfer comment: Improved initiation to stand  with success on first attempt from EOB, supervision for safety.    Balance Overall balance assessment: Needs assistance Sitting-balance support: No upper extremity supported;Feet supported Sitting balance-Leahy Scale: Good     Standing balance support: Bilateral upper extremity supported;During functional activity Standing balance-Leahy Scale: Poor Standing balance comment: Pt reliant on BUE support for mobility.                           ADL either performed or assessed with clinical judgement   ADL Overall ADL's : Needs assistance/impaired     Grooming: Oral care;Wash/dry hands;Wash/dry face;Supervision/safety;Standing Grooming Details (indicate cue type and reason): sink level                 Toilet Transfer: Supervision/safety;Regular Toilet;Grab bars;RW Statistician Details (indicate cue type and reason): supervision for safety, verbal cue for safety Toileting- Clothing Manipulation and Hygiene: Supervision/safety;Sit to/from stand Toileting - Clothing Manipulation Details (indicate cue type and reason): pt completed pericare in standing   Tub/Shower Transfer Details (indicate cue type and reason): Pt declined practicing this skill at the hospital despite offer - she wants to practice this at home with [facility] therapy Functional mobility during ADLs: Supervision/safety;Rolling walker General ADL Comments: supervision for all tasks this session, no sitting rest break required     Vision       Perception     Praxis      Cognition Arousal/Alertness: Awake/alert Behavior During Therapy: WFL for tasks assessed/performed Overall Cognitive Status: Within Functional Limits for tasks assessed  Exercises     Shoulder Instructions       General Comments HR up to as high as 128 bpm    Pertinent Vitals/ Pain       Pain Assessment: No/denies pain Faces Pain Scale: No hurt Pain Intervention(s):  Monitored during session  Home Living                                          Prior Functioning/Environment              Frequency  Min 2X/week        Progress Toward Goals  OT Goals(current goals can now be found in the care plan section)  Progress towards OT goals: Progressing toward goals  Acute Rehab OT Goals Patient Stated Goal: to go home today OT Goal Formulation: With patient Time For Goal Achievement: 02/08/21 Potential to Achieve Goals: Good  Plan Discharge plan remains appropriate    Co-evaluation                 AM-PAC OT "6 Clicks" Daily Activity     Outcome Measure   Help from another person eating meals?: None Help from another person taking care of personal grooming?: A Little Help from another person toileting, which includes using toliet, bedpan, or urinal?: A Little Help from another person bathing (including washing, rinsing, drying)?: A Little Help from another person to put on and taking off regular upper body clothing?: A Little Help from another person to put on and taking off regular lower body clothing?: A Little 6 Click Score: 19    End of Session Equipment Utilized During Treatment: Rolling walker  OT Visit Diagnosis: Unsteadiness on feet (R26.81);Muscle weakness (generalized) (M62.81)   Activity Tolerance Patient tolerated treatment well   Patient Left in bed;with call bell/phone within reach   Nurse Communication Mobility status        Time: 7703-4035 OT Time Calculation (min): 35 min  Charges: OT General Charges $OT Visit: 1 Visit OT Treatments $Self Care/Home Management : 23-37 mins Nyoka Cowden OTR/L Acute Rehabilitation Services Pager: [phone] Office: [phone]   Evern Bio Christian Treadway 01/30/2021, 1:19 PM

## 2021-01-31 ENCOUNTER — Telehealth: Payer: Self-pay | Admitting: Cardiology

## 2021-01-31 LAB — CYTOLOGY - NON PAP

## 2021-01-31 LAB — CHOLESTEROL, BODY FLUID: Cholesterol, Fluid: 50 mg/dL

## 2021-01-31 NOTE — Telephone Encounter (Signed)
New message:     Patient calling to get her CPAP certification for her CPAP that is due or her insurance will not pay for it Patient did not have the paper work in front her. Patient also would like a order for labs so she can get them the same day as her visit. Please advise.

## 2021-01-31 NOTE — Telephone Encounter (Signed)
Sorry this gose to sleep studies

## 2021-02-01 ENCOUNTER — Other Ambulatory Visit: Payer: Self-pay

## 2021-02-01 ENCOUNTER — Ambulatory Visit (INDEPENDENT_AMBULATORY_CARE_PROVIDER_SITE_OTHER): Payer: Medicare Other | Admitting: Cardiology

## 2021-02-01 ENCOUNTER — Encounter: Payer: Self-pay | Admitting: Cardiology

## 2021-02-01 VITALS — BP 120/70 | HR 83 | Ht 62.0 in | Wt 175.0 lb

## 2021-02-01 DIAGNOSIS — E669 Obesity, unspecified: Secondary | ICD-10-CM

## 2021-02-01 DIAGNOSIS — I4821 Permanent atrial fibrillation: Secondary | ICD-10-CM | POA: Diagnosis not present

## 2021-02-01 DIAGNOSIS — G4733 Obstructive sleep apnea (adult) (pediatric): Secondary | ICD-10-CM | POA: Diagnosis not present

## 2021-02-01 NOTE — Progress Notes (Addendum)
Date:  02/01/2021   ID:  [name], DOB 1941/03/01, MRN [id number]  PCP:  Daisy Floro, MD Cardiologist: Everette Rank, MD  Sleep Medicine:  Armanda Magic, MD Electrophysiologist:  None   Chief Complaint:  OSA  History of Present Illness:    Rebecca Short is a 80 y.o. 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  When I last saw her we ordered a new device and she is here for followup after getting her new device per requirements by insurance to document compliance.   She is doing well with her CPAP device and thinks that she has gotten used to it.  She tolerates the mask and feels the pressure is adequate.  Since going on CPAP she feels rested in the am and has no significant daytime sleepiness.  She denies any significant mouth or nasal dryness or nasal congestion.  She does not think that he snores.     Prior CV studies:   The following studies were reviewed today:  PAP compliance download  Past Medical History:  Diagnosis Date   A-fib (HCC)    Allergy    Atrial fibrillation (HCC)    FH: cholecystectomy 1997   Macular degeneration    OSA (obstructive sleep apnea)    mild with total AHI 7.25/hr and severe during REM sleep at 35/hr now on CPAP at 6cm H2O   Past Surgical History:  Procedure Laterality Date   CHOLECYSTECTOMY     PERICARDIOCENTESIS N/A 01/07/2021   Procedure: PERICARDIOCENTESIS;  Surgeon: Yvonne Kendall, MD;  [facility];  Service: Cardiovascular;  Laterality: N/A;   REPLACEMENT TOTAL KNEE BILATERAL     THORACENTESIS N/A 01/25/2021   Procedure: THORACENTESIS;  Surgeon: Martina Sinner, MD;  Location: [facility] ENDOSCOPY;  Service: Pulmonary;  Laterality: N/A;     Current Meds  Medication Sig   acetaminophen (TYLENOL) 325 MG tablet Take 2 tablets (650 mg total) by mouth every 6 (six) hours as needed for moderate pain or mild pain.    CALCIUM CITRATE-VITAMIN D PO Take 1 tablet by mouth at bedtime.   carboxymethylcellulose (REFRESH PLUS) 0.5 % SOLN Place 1 drop into both eyes 2 (two) times daily.   cholecalciferol (VITAMIN D3) 25 MCG (1000 UNIT) tablet Take 1,000 Units by mouth daily.   colchicine 0.6 MG tablet Take 1 tablet (0.6 mg total) by mouth daily.   ELIQUIS 5 MG TABS tablet TAKE 1 TABLET BY MOUTH TWICE A DAY (Patient taking differently: Take 5 mg by mouth 2 (two) times daily.)   EPIPEN 2-PAK 0.3 MG/0.3ML SOAJ injection Inject 0.3 mg into the muscle once as needed for anaphylaxis (severe allergic reaction).   furosemide (LASIX) 40 MG tablet Take 0.5 tablets (20 mg total) by mouth daily.   metoprolol tartrate (LOPRESSOR) 25 MG tablet Take 1.5 tablets (37.5 mg total) by mouth 2 (two) times daily.   Multiple Vitamin (MULTIVITAMIN WITH MINERALS) TABS tablet Take 1 tablet by mouth every morning.   Multiple Vitamins-Minerals (PRESERVISION AREDS PO) Take 1 capsule by mouth 2 (two) times daily.   pantoprazole (PROTONIX) 40 MG tablet Take 1 tablet (40 mg total) by mouth daily at 12 noon.   potassium chloride (KLOR-CON) 20 MEQ packet Take 20 mEq by mouth daily.   predniSONE (DELTASONE) 20 MG tablet Take 2 tablets (40 mg total) by mouth  daily with breakfast.   PRESCRIPTION MEDICATION every 14 (fourteen) days. Allergy shots administered by Dr. [place] Callas   sulfamethoxazole-trimethoprim (BACTRIM DS) 800-160 MG tablet Take 1 tablet by mouth 3 (three) times a week.     Allergies:   Azithromycin and Condrolite [glucosamine-chondroitin-msm]   Social History   Tobacco Use   Smoking status: Never   Smokeless tobacco: Never  Vaping Use   Vaping Use: Never used  Substance Use Topics   Alcohol use: Never   Drug use: Never     Family Hx: The patient's family history includes Cancer in her maternal grandmother; Dementia in her paternal grandmother; Diabetes in her brother; Heart Problems in her father; Kidney failure in her brother;  Osteoporosis in her mother; Stroke in her father; Suicidality in an other family member. There is no history of Heart attack.  ROS:   Please see the history of present illness.     All other systems reviewed and are negative.   Labs/Other Tests and Data Reviewed:    Recent Labs: 01/19/2021: ALT 31 01/23/2021: B Natriuretic Peptide 53.4 01/25/2021: Magnesium 2.0; TSH 2.104 01/30/2021: BUN 26; Creatinine, Ser 0.86; Hemoglobin 13.2; Platelets 347; Potassium 4.1; Sodium 135   Recent Lipid Panel Lab Results  Component Value Date/Time   CHOL 145 08/19/2017 08:16 AM   TRIG 101 08/19/2017 08:16 AM   HDL 47 08/19/2017 08:16 AM   CHOLHDL 3.1 08/19/2017 08:16 AM   LDLCALC 78 08/19/2017 08:16 AM    Wt Readings from Last 3 Encounters:  02/01/21 175 lb (79.4 kg)  01/30/21 175 lb 8 oz (79.6 kg)  01/11/21 184 lb 1.6 oz (83.5 kg)     Objective:    Vital Signs:  BP 120/70   Pulse 83   Ht 5\' 2"  (1.575 m)   Wt 175 lb (79.4 kg)   SpO2 95%   BMI 32.01 kg/m    GEN: Well nourished, well developed in no acute distress HEENT: Normal NECK: No JVD; No carotid bruits LYMPHATICS: No lymphadenopathy CARDIAC:irregularly irregular, no murmurs, rubs, gallops RESPIRATORY:  Clear to auscultation without rales, wheezing or rhonchi  ABDOMEN: Soft, non-tender, non-distended MUSCULOSKELETAL:  No edema; No deformity  SKIN: Warm and dry NEUROLOGIC:  Alert and oriented x 3 PSYCHIATRIC:  Normal affect    ASSESSMENT & PLAN:    1.  OSA - The patient is tolerating PAP therapy well without any problems. The patient has been using and benefiting from PAP use and will continue to benefit from therapy.  -download today showed AHI 1.3/hr, 88% compliant using more than 4 hours nightly and set on 6cm H2O.   2.  Permanent  AF -she remains rate controlled with minimal palpitations -Continue prescription drug management with Eliquis 5mg  BID and Lopressor 37.5mg  BID with PRN refills -I have personally reviewed and  interpreted outside labs ordered by PCP showing SCr 0.86 and K+ 4.1   3.  Obesity  -I have encouraged her to continue in her routine exercise program and cut back on carbs and portions.    Medication Adjustments/Labs and Tests Ordered: Current medicines are reviewed at length with the patient today.  Concerns regarding medicines are outlined above.  Tests Ordered: No orders of the defined types were placed in this encounter.  Medication Changes: No orders of the defined types were placed in this encounter.   Disposition:  Follow up in 1 year(s)  Signed, , MD  02/01/2021 3:39 PM    [facility]

## 2021-02-01 NOTE — Telephone Encounter (Signed)
Called and spoke to patient. She is already scheduled to see Dr. Mayford Knife this afternoon for her sleep concerns. She states that she will bring her chip to her appointment.  Patient also wanted to ensure that she gets a BMET done on the same day as her appointment with Dr. Eldridge Dace on 9/14. Made her aware that there is a note in the chart to get this done at that time. She verbalized understanding and thanked me for the call.

## 2021-02-01 NOTE — Patient Instructions (Signed)
Medication Instructions:   Your physician recommends that you continue on your current medications as directed. Please refer to the Current Medication list given to you today.  *If you need a refill on your cardiac medications before your next appointment, please call your pharmacy*    Follow-Up: At [facility], you and your health needs are our priority.  As part of our continuing mission to provide you with exceptional heart care, we have created designated Provider Care Teams.  These Care Teams include your primary Cardiologist (physician) and Advanced Practice Providers (APPs -  Physician Assistants and Nurse Practitioners) who all work together to provide you with the care you need, when you need it.  We recommend signing up for the patient portal called "MyChart".  Sign up information is provided on this After Visit Summary.  MyChart is used to connect with patients for Virtual Visits (Telemedicine).  Patients are able to view lab/test results, encounter notes, upcoming appointments, etc.  Non-urgent messages can be sent to your provider as well.   To learn more about what you can do with MyChart, go to [email or web address].    Your next appointment:   1 year(s) for sleep follow-up--Nina Jones sleep coordinator will arrange this appointment for you  The format for your next appointment:   In Person  Provider:   Armanda Magic, MD

## 2021-02-05 NOTE — Progress Notes (Signed)
Cardiology Office Note   Date:  02/06/2021   ID:  Rebecca, Short 1940-09-10, MRN [id number]  PCP:  Daisy Floro, MD    No chief complaint on file.  AFib  Wt Readings from Last 3 Encounters:  02/06/21 173 lb (78.5 kg)  02/01/21 175 lb (79.4 kg)  01/30/21 175 lb 8 oz (79.6 kg)       History of Present Illness: Rebecca Short is a 80 y.o. female  with a h/o PAF.  She has chronic back pain which has limited her exercise.  In the past, Managing hydration and stress have helped minimize her palpitations.     In 3/19, it was noted: "She is spending more time in atrial fibrillation now.  It will likely be more difficult to get her out of atrial fibrillation.  Given her lack of symptoms, would not pursue antiarrhythmic drug or cardioversion at this time.   She has been treated for sleep apnea with CPAP.   She went to [facility] [place] in 2019. She was at higher elevation and she did well.  She did not ned any extra metoprolol.   She had her COVID vaccines.  THe day After the second pfizer shot, she felt some irregularity for 30 minutes and it resolved.     She got her booster and flu shot on the same day.  Had some brief palpitations but this resolved.    In 12/2020, she had pneumonia complicated by pleural effusion and pericardial effusion.  She had thoracentesis and pericardiocentesis and AFib rates were better controlled.  She was readmittted with shortness of breath and had bilateral thoracentesis.   Since hospital discharge, shehas done well.  HR and BP have been controlled.   Past Medical History:  Diagnosis Date   A-fib [facility])    Allergy    Atrial fibrillation (HCC)    FH: cholecystectomy 1997   Macular degeneration    OSA (obstructive sleep apnea)    mild with total AHI 7.25/hr and severe during REM sleep at 35/hr now on CPAP at 6cm H2O    Past Surgical History:  Procedure Laterality Date   CHOLECYSTECTOMY     PERICARDIOCENTESIS N/A 01/07/2021    Procedure: PERICARDIOCENTESIS;  Surgeon: Yvonne Kendall, MD;  Location: [facility] INVASIVE CV LAB;  Service: Cardiovascular;  Laterality: N/A;   REPLACEMENT TOTAL KNEE BILATERAL     THORACENTESIS N/A 01/25/2021   Procedure: THORACENTESIS;  Surgeon: Martina Sinner, MD;  Location: [facility] ENDOSCOPY;  Service: Pulmonary;  Laterality: N/A;     Current Outpatient Medications  Medication Sig Dispense Refill   acetaminophen (TYLENOL) 325 MG tablet Take 2 tablets (650 mg total) by mouth every 6 (six) hours as needed for moderate pain or mild pain.     CALCIUM CITRATE-VITAMIN D PO Take 1 tablet by mouth at bedtime.     carboxymethylcellulose (REFRESH PLUS) 0.5 % SOLN Place 1 drop into both eyes 2 (two) times daily.     cholecalciferol (VITAMIN D3) 25 MCG (1000 UNIT) tablet Take 1,000 Units by mouth daily.     colchicine 0.6 MG tablet Take 1 tablet (0.6 mg total) by mouth daily. 15 tablet 0   ELIQUIS 5 MG TABS tablet TAKE 1 TABLET BY MOUTH TWICE A DAY (Patient taking differently: Take 5 mg by mouth 2 (two) times daily.) 60 tablet 6   EPIPEN 2-PAK 0.3 MG/0.3ML SOAJ injection Inject 0.3 mg into the muscle once as needed for anaphylaxis (severe allergic reaction).  0   furosemide (LASIX) 40 MG tablet Take 0.5 tablets (20 mg total) by mouth daily. 30 tablet 0   metoprolol tartrate (LOPRESSOR) 25 MG tablet Take 1.5 tablets (37.5 mg total) by mouth 2 (two) times daily. 60 tablet 1   Multiple Vitamin (MULTIVITAMIN WITH MINERALS) TABS tablet Take 1 tablet by mouth every morning.     Multiple Vitamins-Minerals (PRESERVISION AREDS PO) Take 1 capsule by mouth 2 (two) times daily.     pantoprazole (PROTONIX) 40 MG tablet Take 1 tablet (40 mg total) by mouth daily at 12 noon. 30 tablet 0   potassium chloride (KLOR-CON) 20 MEQ packet Take 20 mEq by mouth daily. 30 each 0   predniSONE (DELTASONE) 20 MG tablet Take 2 tablets (40 mg total) by mouth daily with breakfast. 60 tablet 1   PRESCRIPTION MEDICATION every 14  (fourteen) days. Allergy shots administered by Dr. [place] Callas     sulfamethoxazole-trimethoprim (BACTRIM DS) 800-160 MG tablet Take 1 tablet by mouth 3 (three) times a week. 15 tablet 0   No current facility-administered medications for this visit.    Allergies:   Azithromycin and Condrolite [glucosamine-chondroitin-msm]    Social History:  The patient  reports that she has never smoked. She has never used smokeless tobacco. She reports that she does not drink alcohol and does not use drugs.   Family History:  The patient's family history includes Cancer in her maternal grandmother; Dementia in her paternal grandmother; Diabetes in her brother; Heart Problems in her father; Kidney failure in her brother; Osteoporosis in her mother; Stroke in her father; Suicidality in an other family member.    ROS:  Please see the history of present illness.   Otherwise, review of systems are positive for some fatigue since her hospital.   All other systems are reviewed and negative.    PHYSICAL EXAM: VS:  BP 112/68   Pulse 87   Ht 5\' 2"  (1.575 m)   Wt 173 lb (78.5 kg)   SpO2 96%   BMI 31.64 kg/m  , BMI Body mass index is 31.64 kg/m. GEN: Well nourished, well developed, in no acute distress HEENT: normal Neck: no JVD, carotid bruits, or masses Cardiac: Irregularly irregular, rate controlled; no murmurs, rubs, or gallops,no edema  Respiratory: Decreased breath sounds at the left base, otherwise clear  GI: soft, nontender, nondistended, + BS MS: no deformity or atrophy Skin: warm and dry, no rash Neuro:  Strength and sensation are intact Psych: euthymic mood, full affect     Recent Labs: 01/19/2021: ALT 31 01/23/2021: B Natriuretic Peptide 53.4 01/25/2021: Magnesium 2.0; TSH 2.104 01/30/2021: BUN 26; Creatinine, Ser 0.86; Hemoglobin 13.2; Platelets 347; Potassium 4.1; Sodium 135   Lipid Panel    Component Value Date/Time   CHOL 145 08/19/2017 0816   TRIG 101 08/19/2017 0816   HDL 47 08/19/2017  0816   CHOLHDL 3.1 08/19/2017 0816   LDLCALC 78 08/19/2017 0816     Other studies Reviewed: Additional studies/ records that were reviewed today with results demonstrating: Hospital records reviewed.   ASSESSMENT AND PLAN:  AFib: Rate controlled. Continue current meds. Lowest BP was in the 90s systolic. Metoprolol for rate controlled. Refill furosemide.  Check bmet today. Pericarditis: Refill colchicine and plan for three month course.  She had an echocardiogram in the hospital shortly after her pericardiocentesis and there was only minimal pericardial fluid. Anticoagulated: Eliquis for stroke prevention.  No bleeding problems. Pleural effusion: Volume increasing when she uses incentive spirometer. On Bactrim 3x/week.  OSA: CPAP.   Current medicines are reviewed at length with the patient today.  The patient concerns regarding her medicines were addressed.  The following changes have been made:  No change  Labs/ tests ordered today include:  No orders of the defined types were placed in this encounter.   Recommend 150 minutes/week of aerobic exercise Low fat, low carb, high fiber diet recommended  Disposition:   FU in 6 months   Signed, [name], MD  02/06/2021 3:25 PM    [facility] [address] [place], [place], [place]  [address] Phone: [phone]; Fax: [phone]

## 2021-02-06 ENCOUNTER — Encounter: Payer: Self-pay | Admitting: Interventional Cardiology

## 2021-02-06 ENCOUNTER — Other Ambulatory Visit: Payer: Self-pay

## 2021-02-06 ENCOUNTER — Ambulatory Visit (INDEPENDENT_AMBULATORY_CARE_PROVIDER_SITE_OTHER): Payer: Medicare Other | Admitting: Interventional Cardiology

## 2021-02-06 VITALS — BP 112/68 | HR 87 | Ht 62.0 in | Wt 173.0 lb

## 2021-02-06 DIAGNOSIS — I4821 Permanent atrial fibrillation: Secondary | ICD-10-CM | POA: Diagnosis not present

## 2021-02-06 DIAGNOSIS — Z7901 Long term (current) use of anticoagulants: Secondary | ICD-10-CM | POA: Diagnosis not present

## 2021-02-06 DIAGNOSIS — E669 Obesity, unspecified: Secondary | ICD-10-CM

## 2021-02-06 DIAGNOSIS — G4733 Obstructive sleep apnea (adult) (pediatric): Secondary | ICD-10-CM | POA: Diagnosis not present

## 2021-02-06 MED ORDER — PANTOPRAZOLE SODIUM 40 MG PO TBEC: 40.0000 mg | DELAYED_RELEASE_TABLET | Freq: Every day | ORAL | 2 refills | Status: DC

## 2021-02-06 MED ORDER — POTASSIUM CHLORIDE 20 MEQ PO PACK: 20.0000 meq | PACK | Freq: Every day | ORAL | 2 refills | Status: DC

## 2021-02-06 MED ORDER — COLCHICINE 0.6 MG PO TABS: 0.6000 mg | ORAL_TABLET | Freq: Every day | ORAL | 1 refills | Status: DC

## 2021-02-06 MED ORDER — FUROSEMIDE 20 MG PO TABS: 20.0000 mg | ORAL_TABLET | Freq: Every day | ORAL | 1 refills | Status: DC

## 2021-02-06 NOTE — Patient Instructions (Signed)
Medication Instructions:  Your physician recommends that you continue on your current medications as directed. Please refer to the Current Medication list given to you today.  *If you need a refill on your cardiac medications before your next appointment, please call your pharmacy*   Lab Work: Lab work to be done today-BMP If you have labs (blood work) drawn today and your tests are completely normal, you will receive your results only by: MyChart Message (if you have MyChart) OR A paper copy in the mail If you have any lab test that is abnormal or we need to change your treatment, we will call you to review the results.   Testing/Procedures: none   Follow-Up: At [facility], you and your health needs are our priority.  As part of our continuing mission to provide you with exceptional heart care, we have created designated Provider Care Teams.  These Care Teams include your primary Cardiologist (physician) and Advanced Practice Providers (APPs -  Physician Assistants and Nurse Practitioners) who all work together to provide you with the care you need, when you need it.  We recommend signing up for the patient portal called "MyChart".  Sign up information is provided on this After Visit Summary.  MyChart is used to connect with patients for Virtual Visits (Telemedicine).  Patients are able to view lab/test results, encounter notes, upcoming appointments, etc.  Non-urgent messages can be sent to your provider as well.   To learn more about what you can do with MyChart, go to [email or web address].    Your next appointment:   June 17, 2021 at 1:20  The format for your next appointment:   In Person  Provider:   Everette Rank, MD   Other Instructions

## 2021-02-07 LAB — BASIC METABOLIC PANEL
BUN/Creatinine Ratio: 32 — ABNORMAL HIGH (ref 12–28)
BUN: 27 mg/dL (ref 8–27)
CO2: 25 mmol/L (ref 20–29)
Calcium: 8.8 mg/dL (ref 8.7–10.3)
Chloride: 98 mmol/L (ref 96–106)
Creatinine, Ser: 0.84 mg/dL (ref 0.57–1.00)
Glucose: 178 mg/dL — ABNORMAL HIGH (ref 65–99)
Potassium: 4.6 mmol/L (ref 3.5–5.2)
Sodium: 139 mmol/L (ref 134–144)
eGFR: 70 mL/min/{1.73_m2} (ref >59)

## 2021-02-09 ENCOUNTER — Telehealth: Payer: Self-pay | Admitting: Pulmonary Disease

## 2021-02-09 NOTE — Telephone Encounter (Signed)
Patient called answering service about a rash on her chest  No other associated symptoms  Recently started Bactrim -No known allergy to sulfa drugs  Had been on prednisone before beginning of September-started by Dr. Laren Everts to stop Bactrim  Call office Monday to speak with Dr. Francine Graven

## 2021-02-11 ENCOUNTER — Other Ambulatory Visit: Payer: Self-pay

## 2021-02-11 ENCOUNTER — Encounter (INDEPENDENT_AMBULATORY_CARE_PROVIDER_SITE_OTHER): Payer: Self-pay | Admitting: Ophthalmology

## 2021-02-11 ENCOUNTER — Ambulatory Visit (INDEPENDENT_AMBULATORY_CARE_PROVIDER_SITE_OTHER): Payer: Medicare Other | Admitting: Ophthalmology

## 2021-02-11 DIAGNOSIS — H353221 Exudative age-related macular degeneration, left eye, with active choroidal neovascularization: Secondary | ICD-10-CM | POA: Diagnosis not present

## 2021-02-11 MED ORDER — RANIBIZUMAB 0.5 MG/0.05ML IZ SOSY
0.5000 mg | PREFILLED_SYRINGE | INTRAVITREAL | Status: AC | PRN
  Administered 2021-02-11: .5 mg via INTRAVITREAL

## 2021-02-11 MED ORDER — ATOVAQUONE 750 MG/5ML PO SUSP: 750.0000 mg | Freq: Two times a day (BID) | ORAL | 0 refills | Status: DC

## 2021-02-11 NOTE — Telephone Encounter (Signed)
Prescription signed for 30 days. Will address further need at our follow up visit in October.  Cletis Athens

## 2021-02-11 NOTE — Progress Notes (Signed)
02/11/2021     CHIEF COMPLAINT Patient presents for  Chief Complaint  Patient presents with   Retina Follow Up    14 week fu OU and Lucentis OS Pt states VA OU stable since last visit. Pt denies FOL, floaters, or ocular pain OU.        HISTORY OF PRESENT ILLNESS: Rebecca Short is a 80 y.o. female who presents to the clinic today for:   HPI     Retina Follow Up   Patient presents with  Wet AMD.  In left eye.  This started 3 months ago.  Severity is mild.  Duration of 3 months.  Since onset it is stable. Additional comments: 14 week fu OU and Lucentis OS Pt states VA OU stable since last visit. Pt denies FOL, floaters, or ocular pain OU.          Comments   3 mos fu os oct lucentis os. Patient states vision is stable and unchanged since last visit. Denies any new floaters or FOL.       Last edited by Nelva Nay on 02/11/2021  1:27 PM.      Referring physician: Daisy Floro, MD [address] [place],  [place] [address]  HISTORICAL INFORMATION:   Selected notes from the MEDICAL RECORD NUMBER    Lab Results  Component Value Date   HGBA1C 5.4 12/23/2020     CURRENT MEDICATIONS: Current Outpatient Medications (Ophthalmic Drugs)  Medication Sig   carboxymethylcellulose (REFRESH PLUS) 0.5 % SOLN Place 1 drop into both eyes 2 (two) times daily.   No current facility-administered medications for this visit. (Ophthalmic Drugs)   Current Outpatient Medications (Other)  Medication Sig   acetaminophen (TYLENOL) 325 MG tablet Take 2 tablets (650 mg total) by mouth every 6 (six) hours as needed for moderate pain or mild pain.   CALCIUM CITRATE-VITAMIN D PO Take 1 tablet by mouth at bedtime.   cholecalciferol (VITAMIN D3) 25 MCG (1000 UNIT) tablet Take 1,000 Units by mouth daily.   colchicine 0.6 MG tablet Take 1 tablet (0.6 mg total) by mouth daily.   ELIQUIS 5 MG TABS tablet TAKE 1 TABLET BY MOUTH TWICE A DAY (Patient taking differently: Take 5 mg by  mouth 2 (two) times daily.)   EPIPEN 2-PAK 0.3 MG/0.3ML SOAJ injection Inject 0.3 mg into the muscle once as needed for anaphylaxis (severe allergic reaction).   furosemide (LASIX) 20 MG tablet Take 1 tablet (20 mg total) by mouth daily.   metoprolol tartrate (LOPRESSOR) 25 MG tablet Take 1.5 tablets (37.5 mg total) by mouth 2 (two) times daily.   Multiple Vitamin (MULTIVITAMIN WITH MINERALS) TABS tablet Take 1 tablet by mouth every morning.   Multiple Vitamins-Minerals (PRESERVISION AREDS PO) Take 1 capsule by mouth 2 (two) times daily.   pantoprazole (PROTONIX) 40 MG tablet Take 1 tablet (40 mg total) by mouth daily at 12 noon.   potassium chloride (KLOR-CON) 20 MEQ packet Take 20 mEq by mouth daily.   predniSONE (DELTASONE) 20 MG tablet Take 2 tablets (40 mg total) by mouth daily with breakfast.   PRESCRIPTION MEDICATION every 14 (fourteen) days. Allergy shots administered by Dr.  Callas   sulfamethoxazole-trimethoprim (BACTRIM DS) 800-160 MG tablet Take 1 tablet by mouth 3 (three) times a week.   No current facility-administered medications for this visit. (Other)      REVIEW OF SYSTEMS:    ALLERGIES Allergies  Allergen Reactions   Azithromycin Other (See Comments)  Pain in arm with IV infusion    Condrolite [Glucosamine-Chondroitin-Msm] Rash    PAST MEDICAL HISTORY Past Medical History:  Diagnosis Date   A-fib [facility])    Allergy    Atrial fibrillation (HCC)    FH: cholecystectomy 1997   Macular degeneration    OSA (obstructive sleep apnea)    mild with total AHI 7.25/hr and severe during REM sleep at 35/hr now on CPAP at 6cm H2O   Past Surgical History:  Procedure Laterality Date   CHOLECYSTECTOMY     PERICARDIOCENTESIS N/A 01/07/2021   Procedure: PERICARDIOCENTESIS;  Surgeon: Yvonne Kendall, MD;  Location: [facility] INVASIVE CV LAB;  Service: Cardiovascular;  Laterality: N/A;   REPLACEMENT TOTAL KNEE BILATERAL     THORACENTESIS N/A 01/25/2021   Procedure: THORACENTESIS;   Surgeon: Martina Sinner, MD;  Location: [facility] ENDOSCOPY;  Service: Pulmonary;  Laterality: N/A;    FAMILY HISTORY Family History  Problem Relation Age of Onset   Osteoporosis Mother    Heart Problems Father    Stroke Father    Cancer Maternal Grandmother    Dementia Paternal Grandmother    Kidney failure Brother    Suicidality Other    Diabetes Brother    Heart attack Neg Hx     SOCIAL HISTORY Social History   Tobacco Use   Smoking status: Never   Smokeless tobacco: Never  Vaping Use   Vaping Use: Never used  Substance Use Topics   Alcohol use: Never   Drug use: Never         OPHTHALMIC EXAM:  Base Eye Exam     Visual Acuity (ETDRS)       Right Left   Dist cc 20/25 -1+2 20/25    Correction: Glasses         Tonometry (Tonopen, 1:31 PM)       Right Left   Pressure 14 12         Pupils       Pupils Dark Light APD   Right PERRL 3 2 None   Left PERRL 3 2 None         Extraocular Movement       Right Left    Full Full         Neuro/Psych     Oriented x3: Yes   Mood/Affect: Normal         Dilation     Left eye: 1.0% Mydriacyl, 2.5% Phenylephrine @ 1:31 PM           Slit Lamp and Fundus Exam     External Exam       Right Left   External Normal Normal         Slit Lamp Exam       Right Left   Lids/Lashes Normal Normal   Conjunctiva/Sclera White and quiet White and quiet   Cornea Clear Clear   Anterior Chamber Deep and quiet Deep and quiet   Iris Round and reactive Round and reactive   Lens Posterior chamber intraocular lens Posterior chamber intraocular lens   Anterior Vitreous Normal Normal         Fundus Exam       Right Left   Posterior Vitreous  Posterior vitreous detachment   Disc  Normal   C/D Ratio  0.25   Macula  Retinal pigment epithelial mottling, Early age related macular degeneration, Pigmented atrophy, no hemorrhage, no exudates, no macular thickening, Soft drusen   Vessels  Normal    Periphery  Normal            IMAGING AND PROCEDURES  Imaging and Procedures for 02/11/21  OCT, Retina - OU - Both Eyes       Right Eye Quality was good. Scan locations included subfoveal. Central Foveal Thickness: 297. Progression has improved. Findings include abnormal foveal contour, retinal drusen , no SRF, no IRF, epiretinal membrane.   Left Eye Quality was good. Scan locations included subfoveal. Central Foveal Thickness: 292. Progression has improved. Findings include no IRF, no SRF, abnormal foveal contour.   Notes OS, remained stable at 14-week interval with history of recurrences, will repeat intravitreal Lucentis today and examination again in 3 months  We will continue to monitor region temporal to the fovea with irregular drusenoid pigment epithelial detachment as well as subretinal fluid     Intravitreal Injection, Pharmacologic Agent - OS - Left Eye       Time Out 02/11/2021. 1:53 PM. Confirmed correct patient, procedure, site, and patient consented.   Anesthesia No anesthesia was used. Anesthetic medications included Akten 3.5%.   Procedure Preparation included Tobramycin 0.3%, 10% betadine to eyelids, 5% betadine to ocular surface, Ofloxacin . A 30 gauge needle was used.   Injection: 0.5 mg Ranibizumab 0.5 MG/0.05ML   Route: Intravitreal, Site: Left Eye   NDC: [id number], Lot: [id number], Waste: 0 mL   Post-op Post injection exam found visual acuity of at least counting fingers. The patient tolerated the procedure well. There were no complications. The patient received written and verbal post procedure care education. Post injection medications included ocuflox.              ASSESSMENT/PLAN:  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HCC) Currently at 45-month follow-up in stable condition in the macula after history of recurrences of CNVM.  Repeat intravitreal Lucentis OS today to maintain  follow-up next in 4 months     ICD-10-CM   1. 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HCC)  H35.3221 OCT, Retina - OU - Both Eyes    Intravitreal Injection, Pharmacologic Agent - OS - Left Eye    Ranibizumab SOSY 0.5 mg      1.  OS with wet AMD improved as compared to November 2019.  Much less subretinal fluid now extended interval to 73-month, postinjection and will extend next visit to 87-months  2.  Dilate OU next  3.  Ophthalmic Meds Ordered this visit:  Meds ordered this encounter  Medications   Ranibizumab SOSY 0.5 mg       Return in about 4 months (around 06/13/2021) for DILATE OU, LUCENTIS 0.5 OCT, OS.  There are no Patient Instructions on file for this visit.   Explained the diagnoses, plan, and follow up with the patient and they expressed understanding.  Patient expressed understanding of the importance of proper follow up care.   Alford Highland Jacoria Keiffer M.D. Diseases & Surgery of the Retina and Vitreous [facility] 02/11/21     Abbreviations: M myopia (nearsighted); A astigmatism; H hyperopia (farsighted); P presbyopia; Mrx spectacle prescription;  CTL contact lenses; OD right eye; OS left eye; OU both eyes  XT exotropia; ET esotropia; PEK punctate epithelial keratitis; PEE punctate epithelial erosions; DES dry eye syndrome; MGD meibomian gland dysfunction; ATs artificial tears; PFAT's preservative free artificial tears; NSC nuclear sclerotic cataract; PSC posterior subcapsular cataract; ERM epi-retinal membrane; PVD posterior vitreous detachment; RD retinal detachment; DM diabetes mellitus; DR diabetic retinopathy; NPDR  non-proliferative diabetic retinopathy; PDR proliferative diabetic retinopathy; CSME clinically significant macular edema; DME diabetic macular edema; dbh dot blot hemorrhages; CWS cotton wool spot; POAG primary open angle glaucoma; C/D cup-to-disc ratio; HVF humphrey visual field; GVF  goldmann visual field; OCT optical coherence tomography; IOP intraocular pressure; BRVO Branch retinal vein occlusion; CRVO central retinal vein occlusion; CRAO central retinal artery occlusion; BRAO branch retinal artery occlusion; RT retinal tear; SB scleral buckle; PPV pars plana vitrectomy; VH Vitreous hemorrhage; PRP panretinal laser photocoagulation; IVK intravitreal kenalog; VMT vitreomacular traction; MH Macular hole;  NVD neovascularization of the disc; NVE neovascularization elsewhere; AREDS age related eye disease study; ARMD age related macular degeneration; POAG primary open angle glaucoma; EBMD epithelial/anterior basement membrane dystrophy; ACIOL anterior chamber intraocular lens; IOL intraocular lens; PCIOL posterior chamber intraocular lens; Phaco/IOL phacoemulsification with intraocular lens placement; PRK photorefractive keratectomy; LASIK laser assisted in situ keratomileusis; HTN hypertension; DM diabetes mellitus; COPD chronic obstructive pulmonary disease

## 2021-02-11 NOTE — Telephone Encounter (Signed)
LMTCB

## 2021-02-11 NOTE — Assessment & Plan Note (Signed)
Currently at 38-month follow-up in stable condition in the macula after history of recurrences of CNVM.  Repeat intravitreal Lucentis OS today to maintain follow-up next in 4 months

## 2021-02-11 NOTE — Telephone Encounter (Signed)
Called and spoke with pt who states she still has a rash. Pt said that her last dose of the bactrim was Friday 9/16.   Asked pt if she was still on prednisone and she said that she is.  Pt said that there is no itching associated with the rash, but cannot get it to go away even with stopping the bactrim.  Pt wants to know what we might be able to recommend to help with symptoms. Dr. Francine Graven, please advise.

## 2021-02-11 NOTE — Telephone Encounter (Signed)
I was able to speak with the pt  She states rash is present across her chest, back, and shoulders  It's red but not itching, no bumps or swelling I added Bactrim to her list of allergies She is aware will send another abx in-Will send atovaquone but need to know how many days you want her on it  Please advise, thanks

## 2021-02-11 NOTE — Telephone Encounter (Signed)
I called patient's home and cell phone numbers without answer.  Please try to get a description of the rash and location(s).   If the rash is secondary to Bactrim, then the rash will resolve with stopping the medication.   She will need pjp prophylaxis with another antibiotic if she is not going to take the bactrim again. She will need atovaquone 750mg  BID with food. Please send in prescription if this is the plan.  Thanks, 

## 2021-02-12 ENCOUNTER — Telehealth: Payer: Self-pay | Admitting: Interventional Cardiology

## 2021-02-12 ENCOUNTER — Telehealth: Payer: Self-pay | Admitting: Pulmonary Disease

## 2021-02-12 NOTE — Telephone Encounter (Signed)
Called and spoke with pt and she stated that she had her PT out today and she weighs herself daily at the same time everyday.  She stated that she weighed yesterday and was at 168 and today she weighed and was at 172.  She stated that this happened back on 09/12-09/13.  She has been taking her lasix 20 mg daily but she stated that she does not void like she did before.  She did say that she is some lethargic/no energy and her BP is lower than it normally is.  She is going to reach out to Cardiology about this as well.  She wanted to check with JD to see what needs to be done.  She has a pending appt 10/12 at 230 with JD>    JD Please advise. Thanks

## 2021-02-12 NOTE — Telephone Encounter (Signed)
Agree this should be handled by her cardiology team and adjusting her diuretics. The prednisone can lead to fluid retention but this should be managed with diuretics.   If she continues to have weight gain, dyspnea, low blood pressure then she should head to the ER for further evaluation.  Melody Comas, MD [facility] Office: [phone]   See Amion for personal pager PCCM on call pager [phone] until 7pm. Please call Elink 7p-7a. [phone]

## 2021-02-12 NOTE — Telephone Encounter (Signed)
Spoke with the pt and notified of response per Dr Francine Graven. She verbalized understanding. Nothing further needed. Will f/u with cards.

## 2021-02-12 NOTE — Telephone Encounter (Signed)
Weight gain of 4 pounds overnight. She has called pulmonary about this.  Asked about swelling - very little bit on left leg but not a lot.  Ankles look normal to her. Feels lethargic.  PT came today and worked w her.  PT's BP reading was 90/62  pt used home cuff to get other 2 readings.  Last night and this morning she decreased her metoprolol back to 25 mg from 37.5 mg   Denies fever, cough, SOB.  Able to take a good deep breath and nothing hurts.  Using incentive spirometer.  She recently was hospitalized and had bilateral thoracentesis and pericardial effusion drained as well.  Remains on prednisone.  She is limiting her fluids per pulmonary request.  Only drinking between 18-24 oz of liquid daily.  Reports not as good of response form lasix 20 mg and is thinking of asked pulmonary to increase it.  I adv against this and suggested she may actually be dehydrated.  Asked her to increase fluids today and over the next few days, eat a small salty snack if she begins to feel dizzy or lightheaded.  Discussed holding metoprolol this evening but she is hesitant because she thinks it keeps her from feeling her afib.  Adv to take 25 mg if SBP is >90.    Pt aware I will forward to Dr. Eldridge Dace and his nurse and we will call her back w other recommendations.

## 2021-02-12 NOTE — Telephone Encounter (Signed)
Pt c/o BP issue: STAT if pt c/o blurred vision, one-sided weakness or slurred speech  1. What are your last 5 BP readings? 82/66 88/63 90/62    2. Are you having any other symptoms (ex. Dizziness, headache, blurred vision, passed out)? Pt deny any other symptoms   3. What is your BP issue? She stated her bp has been running lower.  She has been taking  metoprolol tartrate (LOPRESSOR) at 37.5.  She is wanting if sh needs to go back down to the 25mg   She stated she has gained 4 pound in 24 hours.  She has contact Pulmonary about that.  She stated she thinks this is fluid on her lungs

## 2021-02-12 NOTE — Telephone Encounter (Signed)
Attempted to call the pt and but line is busy. Will try back

## 2021-02-12 NOTE — Telephone Encounter (Signed)
OK to decrease metoprolol to 25 mg BID going forward.  Need to see HR as well.    Corky Crafts, MD

## 2021-02-14 MED ORDER — METOPROLOL TARTRATE 25 MG PO TABS: 25.0000 mg | ORAL_TABLET | Freq: Two times a day (BID) | ORAL | 3 refills | Status: DC

## 2021-02-14 NOTE — Telephone Encounter (Signed)
I spoke with patient and gave her information from Dr Eldridge Dace.  Her heart rate has been running 75-90.  She does not need new prescription sent in at this time.

## 2021-02-14 NOTE — Telephone Encounter (Signed)
Patient states she is returning a call. 

## 2021-02-19 NOTE — Telephone Encounter (Signed)
Rebecca Short, Dr. Hoyle Barr nurse advised patient to call back if BP continued to run low or HR increases. Deceased metoprolol tartrate to 25 mg from 37.5 mg BID on 9/22.  02/15/21: 91/64 HR 89 am 89/64 HR - pm 02/16/21: 94/59 HR 75 am 86/62 HR - pm  02/17/21:  89/64 HR 75 am 02/18/21:  91/65 HR 88 am  98/69 HR 77 pm 02/19/21:  91/66 HR 69 am  No dizziness or lightheaded. No complaints except some weakness from recent hospital visit and continuing to recover. ED precautions given.

## 2021-02-19 NOTE — Telephone Encounter (Signed)
Pt c/o BP issue: STAT if pt c/o blurred vision, one-sided weakness or slurred speech  1. What are your last 5 BP readings?  BP continuing to be under 100 And the HR is continuing in the same range that it had been   2. Are you having any other symptoms (ex. Dizziness, headache, blurred vision, passed out)? No   3. What is your BP issue? Rebecca Short is calling to update the office on her BP and HR.

## 2021-02-20 NOTE — Telephone Encounter (Signed)
Follow up:     Patient calling to state that she forgot to tell the nurse she takes BP before her medication in the eveinng. Please advise.

## 2021-02-20 NOTE — Telephone Encounter (Signed)
I spoke with patient and told her heart rate is OK per Dr Eldridge Dace. Patient reports BP continues to run low.  She reports the following readings- 9/28- 85/67 9/27- 91/66 in the AM           94/69 in the PM 9/26- 91/65 in the AM           98/69-in the PM 9/25- 89/64 in the AM  Today she is feeling lightheaded but has not had lightheadedness any other days.  Orthostatic precautions reviewed with patient. BP readings are taken about an hour after taking lopressor. Has not checked BP prior to taking lopressor. I asked patient to check BP prior to taking lopressor and again an hour or so after in possible.  I advised her for now until we heard back from Dr Eldridge Dace she should hold lopressor dose if BP less than 100.

## 2021-02-20 NOTE — Telephone Encounter (Signed)
OK. COntinue to monitor.   JV   I placed call to patient and left message to call office

## 2021-02-21 NOTE — Telephone Encounter (Signed)
OK to hold metoprolol and continue to monitor HR , BP and for any symptoms like dizziness of palpitations.  As long as HR and BP are stable off of metoprolol, will continue to hold it.

## 2021-02-21 NOTE — Addendum Note (Signed)
Addended by: Dossie Arbour on: 02/21/2021 01:19 PM   Modules accepted: Orders

## 2021-02-21 NOTE — Telephone Encounter (Signed)
I spoke with patient. She reports evening readings were prior to taking metoprolol. Last evening BP was 99/70. Heart rate 85-90. She did not take metoprolol last evening. Today her BP is 92/69, heart rate 82. She has held metoprolol this AM until reviewed by Dr Eldridge Dace. She is not having any lightheadedness this morning.

## 2021-02-21 NOTE — Telephone Encounter (Signed)
I spoke with patient and gave her information from Dr Eldridge Dace.  She will let us know if her heart rate and blood pressure start running higher than normal.

## 2021-02-25 ENCOUNTER — Other Ambulatory Visit: Payer: Self-pay | Admitting: Pulmonary Disease

## 2021-02-27 ENCOUNTER — Telehealth: Payer: Self-pay | Admitting: Pulmonary Disease

## 2021-02-28 ENCOUNTER — Other Ambulatory Visit: Payer: Self-pay | Admitting: Interventional Cardiology

## 2021-02-28 MED ORDER — ATOVAQUONE 750 MG/5ML PO SUSP
750.0000 mg | Freq: Two times a day (BID) | ORAL | 0 refills | Status: DC
Start: 2021-02-28 — End: 2021-03-06

## 2021-02-28 NOTE — Telephone Encounter (Signed)
Refill has been sent to the pharmacy.  I have called the pt and she is aware.  Nothing further is needed.

## 2021-02-28 NOTE — Telephone Encounter (Signed)
Ok to send refill.   Thanks, Cletis Athens

## 2021-02-28 NOTE — Telephone Encounter (Signed)
Pt stated that she has enough of the mepron to last until 2 days before her next appt with JD.   She is wanting to know if she needs to stay on this?  If so she will need to have a refill.  JD please advise.  Thanks   Pts next OV is 03/06/2021

## 2021-02-28 NOTE — Telephone Encounter (Signed)
Pt's pharmacy is requesting a refill on colchicine. Would Dr. Eldridge Dace like to refill this medication? Please address

## 2021-03-01 ENCOUNTER — Telehealth: Payer: Self-pay | Admitting: Interventional Cardiology

## 2021-03-01 NOTE — Telephone Encounter (Signed)
Patient notified

## 2021-03-01 NOTE — Telephone Encounter (Signed)
Patient calling to give update after stopping metoprolol. She reports the following BP readings over the last several days. 113/84, 115/82, 105/75, 110/75, 109/69, 105/76, 102/68, 95/71. Reading are taken in the AM and PM. Heart rate when checking BP has been mid 70's to 90. She does have episodes where heart rate goes up. States rate "bounces all over".  She will start feeling tired after exerting herself and when she checks heart rate it is elevated.  Running 114-140 at those times.  Happened once after showering.  She will rest and heart rate will eventually come down.  Increased heart rates happen daily based on activities.  Has been working with PT without excessive increase in heart rate. Will forward to Dr Eldridge Dace for review/recommendations.

## 2021-03-01 NOTE — Telephone Encounter (Signed)
Medication was started on 01/04/21 in hospital. Per Dr Eldridge Dace patient should continue for 3 months total.  I spoke with [facility] and they have one refill remaining and new prescription not needed at this time

## 2021-03-01 NOTE — Telephone Encounter (Signed)
   Pt is requesting to speak with Dennie Bible, she said she needs to give her an update of her BP and HR. She wants to give it to her

## 2021-03-01 NOTE — Telephone Encounter (Signed)
COntinue to monitor off of metoprolol As long as she is doing well with PT.  If increased HR episodes were to become more frequent, could add back metoprolol 12.5 mg once a day.

## 2021-03-06 ENCOUNTER — Ambulatory Visit (INDEPENDENT_AMBULATORY_CARE_PROVIDER_SITE_OTHER): Payer: Medicare Other

## 2021-03-06 ENCOUNTER — Ambulatory Visit (INDEPENDENT_AMBULATORY_CARE_PROVIDER_SITE_OTHER): Payer: Medicare Other | Admitting: Pulmonary Disease

## 2021-03-06 ENCOUNTER — Encounter: Payer: Self-pay | Admitting: Pulmonary Disease

## 2021-03-06 ENCOUNTER — Other Ambulatory Visit: Payer: Self-pay

## 2021-03-06 VITALS — BP 114/76 | HR 92 | Ht 62.0 in | Wt 174.4 lb

## 2021-03-06 DIAGNOSIS — J90 Pleural effusion, not elsewhere classified: Secondary | ICD-10-CM

## 2021-03-06 MED ORDER — PREDNISONE 10 MG PO TABS: ORAL_TABLET | ORAL | 0 refills | Status: DC

## 2021-03-06 MED ORDER — ATOVAQUONE 750 MG/5ML PO SUSP: 750.0000 mg | Freq: Two times a day (BID) | ORAL | 0 refills | Status: DC

## 2021-03-06 NOTE — Patient Instructions (Addendum)
Prednisone Taper Instructions: 30mg  daily 10/13 - 10/26 20mg  daily 10/27 - 11/9 10mg  daily 11/10 - 11/23 5mg  daily 11/24 - 11/30  Continue to take atovaquone antibiotic until 11/11  We will check a CT Chest scan to further evaluate the left pleural space

## 2021-03-08 LAB — FUNGAL ORGANISM REFLEX

## 2021-03-08 LAB — FUNGUS CULTURE WITH STAIN

## 2021-03-08 LAB — FUNGUS CULTURE RESULT

## 2021-03-10 NOTE — Progress Notes (Signed)
Synopsis: Referred in October 2022 for hospital follow up for pleural effusions  Subjective:   PATIENT ID: Rebecca Short GENDER: female DOB: January 05, 1941, MRN: [id number]   HPI  Chief Complaint  Patient presents with   Hospitalization Follow-up    HFU for pleural effusion. States she has been working on getting her strength back.    Rebecca Short is an 80 year old woman, never smoker with history of obstructive sleep apnea and atrial fibrillation who comes to pulmonary clinic for follow up of serositis.   She was admitted 8/27 to 9/7 for pericardial effusion and bilateral pleural effusions. She is status post pericardial drain and multiple thoracenteses. The pleural fluid studies were consistent with exudative process which were initially neutrophil predominant that later became lymphocyte predominant. Cytology was negative for malignancy but showed reactive mesothelial cells.  She is being treated for serositis with prednisone taper and colchicine for the pericardial effusion. She has been on 40mg  of prednisone over the past month. She has been on pneumocystis prophylaxis with atovaquone, she developed skin rash with bactrim. Her inflammatory workup was rather unrevealing as ANA, CCP anti-histone ab and SSA/SSB were negative. Her rheumatoid factor was elevated at 57.2.   She is feeling significantly better since discharge is resuming her normal activity and is quite satisfied with her recovering thus far.   Chest radiograph today shows opacities at the left lung base. The right lung is significantly improved since prior study with resolution of right pleural effusion.  Past Medical History:  Diagnosis Date   A-fib [facility])    Allergy    Atrial fibrillation (HCC)    FH: cholecystectomy 1997   Macular degeneration    OSA (obstructive sleep apnea)    mild with total AHI 7.25/hr and severe during REM sleep at 35/hr now on CPAP at 6cm H2O     Family History  Problem Relation Age of Onset    Osteoporosis Mother    Heart Problems Father    Stroke Father    Cancer Maternal Grandmother    Dementia Paternal Grandmother    Kidney failure Brother    Suicidality Other    Diabetes Brother    Heart attack Neg Hx      Social History   Socioeconomic History   Marital status: Married    Spouse name: Not on file   Number of children: Not on file   Years of education: Not on file   Highest education level: Not on file  Occupational History   Not on file  Tobacco Use   Smoking status: Never   Smokeless tobacco: Never  Vaping Use   Vaping Use: Never used  Substance and Sexual Activity   Alcohol use: Never   Drug use: Never   Sexual activity: Not on file  Other Topics Concern   Not on file  Social History Narrative   ** Merged History Encounter **       Social Determinants of Health   Financial Resource Strain: Not on file  Food Insecurity: Not on file  Transportation Needs: Not on file  Physical Activity: Not on file  Stress: Not on file  Social Connections: Not on file  Intimate Partner Violence: Not on file     Allergies  Allergen Reactions   Azithromycin Other (See Comments)    Pain in arm with IV infusion    Bactrim [Sulfamethoxazole-Trimethoprim] Rash   Condrolite [Glucosamine-Chondroitin-Msm] Rash     Outpatient Medications Prior to Visit  Medication Sig Dispense  Refill   acetaminophen (TYLENOL) 325 MG tablet Take 2 tablets (650 mg total) by mouth every 6 (six) hours as needed for moderate pain or mild pain.     CALCIUM CITRATE-VITAMIN D PO Take 1 tablet by mouth at bedtime.     carboxymethylcellulose (REFRESH PLUS) 0.5 % SOLN Place 1 drop into both eyes 2 (two) times daily.     cholecalciferol (VITAMIN D3) 25 MCG (1000 UNIT) tablet Take 1,000 Units by mouth daily.     colchicine 0.6 MG tablet Take 1 tablet (0.6 mg total) by mouth daily. 30 tablet 1   ELIQUIS 5 MG TABS tablet TAKE 1 TABLET BY MOUTH TWICE A DAY (Patient taking differently: Take 5 mg  by mouth 2 (two) times daily.) 60 tablet 6   EPIPEN 2-PAK 0.3 MG/0.3ML SOAJ injection Inject 0.3 mg into the muscle once as needed for anaphylaxis (severe allergic reaction).  0   furosemide (LASIX) 20 MG tablet Take 1 tablet (20 mg total) by mouth daily. 90 tablet 1   Multiple Vitamin (MULTIVITAMIN WITH MINERALS) TABS tablet Take 1 tablet by mouth every morning.     Multiple Vitamins-Minerals (PRESERVISION AREDS PO) Take 1 capsule by mouth 2 (two) times daily.     pantoprazole (PROTONIX) 40 MG tablet Take 1 tablet (40 mg total) by mouth daily at 12 noon. 90 tablet 2   potassium chloride (KLOR-CON) 20 MEQ packet Take 20 mEq by mouth daily. 90 each 2   PRESCRIPTION MEDICATION every 14 (fourteen) days. Allergy shots administered by Dr. [place] Callas     atovaquone [facility]) 750 MG/5ML suspension Take 5 mLs (750 mg total) by mouth 2 (two) times daily with a meal. 210 mL 0   predniSONE (DELTASONE) 20 MG tablet Take 2 tablets (40 mg total) by mouth daily with breakfast. 60 tablet 1   No facility-administered medications prior to visit.    Review of Systems  Constitutional:  Negative for chills, fever, malaise/fatigue and weight loss.  HENT:  Negative for congestion, sinus pain and sore throat.   Eyes: Negative.   Respiratory:  Negative for cough, hemoptysis, sputum production, shortness of breath and wheezing.   Cardiovascular:  Negative for chest pain, palpitations, orthopnea, claudication and leg swelling.  Gastrointestinal:  Negative for abdominal pain, heartburn, nausea and vomiting.  Genitourinary: Negative.   Musculoskeletal:  Negative for joint pain and myalgias.  Skin:  Negative for rash.  Neurological:  Negative for weakness.  Endo/Heme/Allergies: Negative.   Psychiatric/Behavioral: Negative.     Objective:   Vitals:   03/06/21 1443  BP: 114/76  Pulse: 92  SpO2: 97%  Weight: 174 lb 6.4 oz (79.1 kg)  Height: 5\' 2"  (1.575 m)   Physical Exam Constitutional:      General: She is not  in acute distress.    Appearance: She is not ill-appearing.  HENT:     Head: Normocephalic and atraumatic.  Eyes:     General: No scleral icterus.    Conjunctiva/sclera: Conjunctivae normal.     Pupils: Pupils are equal, round, and reactive to light.  Cardiovascular:     Rate and Rhythm: Normal rate and regular rhythm.     Pulses: Normal pulses.     Heart sounds: Normal heart sounds. No murmur heard. Pulmonary:     Effort: Pulmonary effort is normal.     Breath sounds: Normal breath sounds. No wheezing, rhonchi or rales.  Abdominal:     General: Bowel sounds are normal.     Palpations: Abdomen is soft.  Musculoskeletal:     Right lower leg: No edema.     Left lower leg: No edema.  Lymphadenopathy:     Cervical: No cervical adenopathy.  Skin:    General: Skin is warm and dry.  Neurological:     General: No focal deficit present.     Mental Status: She is alert.  Psychiatric:        Mood and Affect: Mood normal.        Behavior: Behavior normal.        Thought Content: Thought content normal.        Judgment: Judgment normal.   CBC    Component Value Date/Time   WBC 12.0 (H) 01/30/2021 0222   RBC 4.52 01/30/2021 0222   HGB 13.2 01/30/2021 0222   HGB 15.7 12/27/2018 0831   HCT 39.7 01/30/2021 0222   HCT 47.4 (H) 12/27/2018 0831   PLT 347 01/30/2021 0222   PLT 213 12/27/2018 0831   MCV 87.8 01/30/2021 0222   MCV 90 12/27/2018 0831   MCH 29.2 01/30/2021 0222   MCHC 33.2 01/30/2021 0222   RDW 14.8 01/30/2021 0222   RDW 13.1 12/27/2018 0831   LYMPHSABS 1.2 01/19/2021 1803   MONOABS 1.0 01/19/2021 1803   EOSABS 0.0 01/19/2021 1803   BASOSABS 0.0 01/19/2021 1803   BMP Latest Ref Rng & Units 02/06/2021 01/30/2021 01/29/2021  Glucose 65 - 99 mg/dL 272(Z) 366(Y) 403(K)  BUN 8 - 27 mg/dL 27 74(Q) 59(D)  Creatinine 0.57 - 1.00 mg/dL 6.38 7.56 4.33  BUN/Creat Ratio 12 - 28 32(H) - -  Sodium 134 - 144 mmol/L 139 135 136  Potassium 3.5 - 5.2 mmol/L 4.6 4.1 4.1  Chloride 96 -  106 mmol/L 98 101 106  CO2 20 - 29 mmol/L 25 28 18(L)  Calcium 8.7 - 10.3 mg/dL 8.8 2.9(J) 1.8(A)   Chest imaging: CXR 03/06/21 Resolved right-sided pleural effusion with persistent opacity on the left, likely a combination of persisting pleural fluid and associated atelectasis/consolidation  CXR 01/30/21 1. Slight interval decrease in size of the right pleural effusion with improved aeration of the right base. 2. No significant interval change of the larger left pleural effusion and adjacent airspace disease.  CT Chest 01/03/21 Moderate left and small right pleural effusions, which is increased on the left and new on the right in comparison to prior CT. Adjacent bibasilar and lingular atelectasis.   Increased, moderate-sized pericardial effusion. Biatrial enlargement.   Mild pulmonary edema.  PFT: No flowsheet data found.  Labs: 01/21/21: Quantiferon gold indeterminate  Path:  Echo 01/20/21:  1. Left ventricular ejection fraction, by estimation, is 60 to 65%. The  left ventricle has normal function. The left ventricle has no regional  wall motion abnormalities.   2. A small pericardial effusion is present. There is no evidence of  cardiac tamponade.  Heart Catheterization:       Assessment & Plan:   Pleural effusion on left - Plan: predniSONE (DELTASONE) 10 MG tablet, atovaquone (MEPRON) 750 MG/5ML suspension  Pleural effusion, not elsewhere classified - Plan: CT Chest W Contrast  Discussion: Rebecca Short is an 80 year old woman, never smoker with history of obstructive sleep apnea and atrial fibrillation who comes to pulmonary clinic for follow up of serositis  She has had significant clinical improvement and resolution of her dyspnea.  Her functional status continues to improve and she is likely dealing with a degree of deconditioning at this time.  There is resolution of her right  pleural effusion.  There is improvement of the left pleural effusion but a small  persistent effusion is present and possibly some thickening of the pleura with potential for trapped lung physiology.  We will further investigate with a CT chest scan with contrast.  I discussed with the patient and her husband that if there is thickening of the pleura that there is not much else we could pursue in regards to treatment and she may have some permanent loss of lung function.  We will move forward with her prednisone taper as outlined in the patient instruction section.  She will plan to be done with prednisone around the end of November.  Once she is below 20 mg of prednisone daily she can stop taking atovaquone pneumocystis prophylaxis.  Follow-up in 2 months.  Melody Comas, MD  Pulmonary & Critical Care Office: [phone]   Current Outpatient Medications:    acetaminophen (TYLENOL) 325 MG tablet, Take 2 tablets (650 mg total) by mouth every 6 (six) hours as needed for moderate pain or mild pain., Disp: , Rfl:    CALCIUM CITRATE-VITAMIN D PO, Take 1 tablet by mouth at bedtime., Disp: , Rfl:    carboxymethylcellulose (REFRESH PLUS) 0.5 % SOLN, Place 1 drop into both eyes 2 (two) times daily., Disp: , Rfl:    cholecalciferol (VITAMIN D3) 25 MCG (1000 UNIT) tablet, Take 1,000 Units by mouth daily., Disp: , Rfl:    colchicine 0.6 MG tablet, Take 1 tablet (0.6 mg total) by mouth daily., Disp: 30 tablet, Rfl: 1   ELIQUIS 5 MG TABS tablet, TAKE 1 TABLET BY MOUTH TWICE A DAY (Patient taking differently: Take 5 mg by mouth 2 (two) times daily.), Disp: 60 tablet, Rfl: 6   EPIPEN 2-PAK 0.3 MG/0.3ML SOAJ injection, Inject 0.3 mg into the muscle once as needed for anaphylaxis (severe allergic reaction)., Disp: , Rfl: 0   furosemide (LASIX) 20 MG tablet, Take 1 tablet (20 mg total) by mouth daily., Disp: 90 tablet, Rfl: 1   Multiple Vitamin (MULTIVITAMIN WITH MINERALS) TABS tablet, Take 1 tablet by mouth every morning., Disp: , Rfl:    Multiple Vitamins-Minerals (PRESERVISION  AREDS PO), Take 1 capsule by mouth 2 (two) times daily., Disp: , Rfl:    pantoprazole (PROTONIX) 40 MG tablet, Take 1 tablet (40 mg total) by mouth daily at 12 noon., Disp: 90 tablet, Rfl: 2   potassium chloride (KLOR-CON) 20 MEQ packet, Take 20 mEq by mouth daily., Disp: 90 each, Rfl: 2   predniSONE (DELTASONE) 10 MG tablet, Take 3 tablets (30 mg total) by mouth daily with breakfast for 14 days, THEN 2 tablets (20 mg total) daily with breakfast for 14 days, THEN 1 tablet (10 mg total) daily with breakfast for 14 days, THEN 0.5 tablets (5 mg total) daily with breakfast for 7 days., Disp: 20 tablet, Rfl: 0   PRESCRIPTION MEDICATION, every 14 (fourteen) days. Allergy shots administered by Dr. [place] Callas, Disp: , Rfl:    atovaquone (MEPRON) 750 MG/5ML suspension, Take 5 mLs (750 mg total) by mouth 2 (two) times daily with a meal., Disp: 210 mL, Rfl: 0

## 2021-03-12 ENCOUNTER — Telehealth: Payer: Self-pay | Admitting: Pulmonary Disease

## 2021-03-12 NOTE — Telephone Encounter (Signed)
Spoke with pt who states left shoulder pain started yesterday and she is concerned that it may be r/t left lung issues. Pt states she is unware of any extra strain on the left shoulder in recent past. Pt states it may be r/t weather change. Pt did check O2 this morning an states reading was 76% and a HR of 30. This one a 1 reading and stats and HR were back in WNL at neck check. O2 readings were 99% and HR 54 when pt was on phone with RN. Pt advise if readings like prior low one happen again pt should seek emergent medical evaluation. Dr. Francine Graven please advise.

## 2021-03-12 NOTE — Telephone Encounter (Signed)
Would it be possible to move CT chest scan to this week to help evaluate her symptoms?  Thanks, Cletis Athens

## 2021-03-12 NOTE — Telephone Encounter (Signed)
I agree, if she has oxygen and heart rate readings similar to the initial findings then I would recommend going to the ER for evaluation.   We ordered a CT Chest scan at last visit, when is she scheduled to have this? This will help [place] evaluate if there is any issues with pleural effusion on that side that could be causing her shoulder pain symptoms.   Thanks, Cletis Athens

## 2021-03-12 NOTE — Telephone Encounter (Signed)
PCCs could you please advise?

## 2021-03-12 NOTE — Telephone Encounter (Signed)
Pt states her left shoulder began to hurt yesterday. Wonders if it is connected to her left lung or if its weather related. This morning, she had trouble testing her oxygen. States it was at 83. Heart rate was in the 30s. Half hour later, both oxygen and heart rate came up.

## 2021-03-12 NOTE — Telephone Encounter (Signed)
Spoke with Rebecca Short and reviewed Dr. Lanora Manis recommendations. CT is scheduled for 03/25/21. Routing to Dr. Francine Graven as [place] Childes. Nothing further needed at this time.

## 2021-03-13 NOTE — Telephone Encounter (Addendum)
Pt is scheduled at [facility] on Monday.  I called to see if CT can be moved to this week.  I spoke to [place] & they do not have any openings this week.  She states order will have to be put in as a stat if it needs to be this week.  I will route back to triage so new order can be placed if needed.

## 2021-03-19 ENCOUNTER — Telehealth: Payer: Self-pay | Admitting: Pharmacy Technician

## 2021-03-19 NOTE — Telephone Encounter (Signed)
Patient Advocate Encounter  Received notification from [employer] that prior authorization for ATOVAQUONE 750MG /5ML is required.   PA submitted on 10.25.22 Key  [id number] Status is pending   [facility] will continue to follow  10.27.22, CPhT Patient Advocate Phone: [phone] Fax:  [phone]

## 2021-03-19 NOTE — Telephone Encounter (Signed)
Received notification from [employer] regarding a prior authorization for ATOVAQUONE. Authorization has been APPROVED from 01.01.22 to 1.23.23.    Authorization # [id number]

## 2021-03-21 ENCOUNTER — Encounter: Payer: Self-pay | Admitting: Pulmonary Disease

## 2021-03-25 ENCOUNTER — Ambulatory Visit
Admission: RE | Admit: 2021-03-25 | Discharge: 2021-03-25 | Disposition: A | Discharge status: Home / Self Care | Payer: Medicare Other | Source: Ambulatory Visit | Attending: Pulmonary Disease | Admitting: Pulmonary Disease

## 2021-03-25 DIAGNOSIS — J90 Pleural effusion, not elsewhere classified: Secondary | ICD-10-CM

## 2021-03-25 MED ORDER — IOPAMIDOL (ISOVUE-300) INJECTION 61%
75.0000 mL | Freq: Once | INTRAVENOUS | Status: AC | PRN
  Administered 2021-03-25: 75 mL via INTRAVENOUS

## 2021-03-28 ENCOUNTER — Telehealth: Payer: Self-pay | Admitting: Pulmonary Disease

## 2021-03-28 ENCOUNTER — Other Ambulatory Visit: Payer: Self-pay | Admitting: Interventional Cardiology

## 2021-03-29 NOTE — Telephone Encounter (Signed)
JD pt is calling in regards to her CT results.   Please advise. Thanks

## 2021-03-29 NOTE — Telephone Encounter (Signed)
Patient checking on results of CT scan. Patient phone number is [phone].

## 2021-04-01 NOTE — Telephone Encounter (Signed)
**  correction the dates** 11/14 or 11/15  Thanks, Cletis Athens

## 2021-04-01 NOTE — Telephone Encounter (Signed)
Please schedule patient for US guided thoracentesis at the [facility] suite on 11/4 or 11/5 in the afternoon. This will be for left pleural effusion. I spoke with patient on the phone about her CT chest scan results and she is aware we will be scheduling her for the procedure next week.  Thank you, Cletis Athens

## 2021-04-01 NOTE — Telephone Encounter (Signed)
Spoke with Rebecca Short with [name] scheduled for Monday 04/08/21 at 2 pm  Pt aware to arrive to admitting 1:45 pm  She is asking if needs to hold Eliquis at all prior- she takes 5 mg bid   Please advise, thanks!

## 2021-04-02 ENCOUNTER — Telehealth: Payer: Self-pay | Admitting: Interventional Cardiology

## 2021-04-02 NOTE — Telephone Encounter (Signed)
Eliquis should be held starting Saturday 04/06/21.  Thanks,  Cletis Athens

## 2021-04-02 NOTE — Telephone Encounter (Signed)
Clinical pharmacist to review Eliquis 

## 2021-04-02 NOTE — Telephone Encounter (Signed)
Patient with diagnosis of afib on Eliquis for anticoagulation.    Procedure: Ultrasound of Lung and possible Thoracentesis of Lower L Lung  Date of procedure: 04/08/21   CHA2DS2-VASc Score = 3  This indicates a 3.2% annual risk of stroke. The patient's score is based upon: CHF History: 0 HTN History: 0 Diabetes History: 0 Stroke History: 0 Vascular Disease History: 0 Age Score: 2 Gender Score: 1   CrCl 51.41 mL/min using adjusted body weight due to obesity Platelet count 327K  Per office protocol, patient can hold Eliquis for 1-2 days prior to procedure.

## 2021-04-02 NOTE — Telephone Encounter (Signed)
   [facility] Pre-operative Risk Assessment    Patient Name: Rebecca Short  DOB: 13-Jul-1940 MRN: [id number]  HEARTCARE STAFF:  - IMPORTANT!!!!!! Under Visit Info/Reason for Call, type in Other and utilize the format Clearance MM/DD/YY or Clearance TBD. Do not use dashes or single digits. - Please review there is not already an duplicate clearance open for this procedure. - If request is for dental extraction, please clarify the # of teeth to be extracted. - If the patient is currently at the dentist's office, call Pre-Op Callback Staff (MA/nurse) to input urgent request.  - If the patient is not currently in the dentist office, please route to the Pre-Op pool.  Request for surgical clearance:  What type of surgery is being performed? Ultrasound of Lung and possible Thoracentesis of Lower L Lung   When is this surgery scheduled? 04/08/21  What type of clearance is required (medical clearance vs. Pharmacy clearance to hold med vs. Both)? Pharmacy  Are there any medications that need to be held prior to surgery and how long? Eliquis  Practice name and name of physician performing surgery? Dr. Freda Jackson   What is the office phone number? [phone]   7.   What is the office fax number? Patient did not know   8.   Anesthesia type (None, local, MAC, general) ? Local    Rebecca Short 04/02/2021, 11:17 AM  _________________________________________________________________   (provider comments below)

## 2021-04-02 NOTE — Telephone Encounter (Signed)
Spoke with the pt and notified of response per Dr Francine Graven  Nothing further needed

## 2021-04-03 ENCOUNTER — Telehealth: Payer: Self-pay | Admitting: Pulmonary Disease

## 2021-04-03 NOTE — Telephone Encounter (Signed)
This is not a bronch, but a thoracentesis CPT code 50277.  Notified Kimmie.  Nothing further needed at this time.

## 2021-04-03 NOTE — Telephone Encounter (Signed)
Called Dr. Lanora Manis office and obtained fax# [phone]. I will fax notes to requesting office.

## 2021-04-03 NOTE — Telephone Encounter (Signed)
    Patient Name: Rebecca Short  DOB: Apr 28, 1941 MRN: [id number]  Primary Cardiologist: [name], MD  Chart reviewed as part of pre-operative protocol coverage. Patient has upcoming ultrasound of the lung with possible thoracentesis planned and we were asked to give our recommendations for holding Eliquis. Per Pharmacy and office protocol, patient can hold Eliquis for 1-2 days prior to procedure. Please restart this as soon as safely possible following thoracentesis.   I will route this recommendation to the requesting party via Epic fax function and remove from pre-op pool.  Please call with questions.  Corrin Parker, PA-C 04/03/2021, 12:19 PM

## 2021-04-03 NOTE — Telephone Encounter (Signed)
Pre-op covering staff, can we find out Dr. Lanora Manis fax number and send this there way?  Thanks so much!

## 2021-04-04 NOTE — Telephone Encounter (Signed)
Patient calling to follow up on whether she can hold the eliquis.

## 2021-04-08 ENCOUNTER — Encounter (HOSPITAL_COMMUNITY): Payer: Self-pay | Admitting: Pulmonary Disease

## 2021-04-08 ENCOUNTER — Ambulatory Visit (HOSPITAL_COMMUNITY): Payer: Medicare Other

## 2021-04-08 ENCOUNTER — Encounter (HOSPITAL_COMMUNITY)
Admission: RE | Disposition: A | Discharge status: Home / Self Care | Payer: Self-pay | Source: Ambulatory Visit | Attending: Pulmonary Disease

## 2021-04-08 ENCOUNTER — Ambulatory Visit (HOSPITAL_COMMUNITY)
Admission: RE | Admit: 2021-04-08 | Discharge: 2021-04-08 | Disposition: A | Discharge status: Home / Self Care | Payer: Medicare Other | Source: Ambulatory Visit | Attending: Pulmonary Disease | Admitting: Pulmonary Disease

## 2021-04-08 DIAGNOSIS — J90 Pleural effusion, not elsewhere classified: Secondary | ICD-10-CM

## 2021-04-08 DIAGNOSIS — Z9889 Other specified postprocedural states: Secondary | ICD-10-CM

## 2021-04-08 HISTORY — PX: THORACENTESIS: SHX235

## 2021-04-08 LAB — LACTATE DEHYDROGENASE, PLEURAL OR PERITONEAL FLUID: LD, Fluid: 66 U/L — ABNORMAL HIGH (ref 3–23)

## 2021-04-08 LAB — BODY FLUID CELL COUNT WITH DIFFERENTIAL
Eos, Fluid: 0 %
Lymphs, Fluid: 16 %
Monocyte-Macrophage-Serous Fluid: 9 % — ABNORMAL LOW (ref 50–90)
Neutrophil Count, Fluid: 75 % — ABNORMAL HIGH (ref 0–25)
Total Nucleated Cell Count, Fluid: 3793 cu mm — ABNORMAL HIGH (ref 0–1000)

## 2021-04-08 LAB — GLUCOSE, PLEURAL OR PERITONEAL FLUID: Glucose, Fluid: 148 mg/dL

## 2021-04-08 LAB — ALBUMIN, PLEURAL OR PERITONEAL FLUID: Albumin, Fluid: 1.9 g/dL

## 2021-04-08 LAB — PROTEIN, PLEURAL OR PERITONEAL FLUID: Total protein, fluid: 3.2 g/dL

## 2021-04-08 SURGERY — THORACENTESIS
Anesthesia: LOCAL

## 2021-04-08 NOTE — Discharge Instructions (Signed)
Please call the clinic if you have any increasing pain or shortness of breath.

## 2021-04-08 NOTE — Op Note (Signed)
Thoracentesis  Procedure Note  Rebecca Short  [id number]  1941/03/06  Date:04/08/21  Time:3:02 PM   Provider Performing:Kohan Azizi B Babatunde Seago   Procedure: Thoracentesis with imaging guidance (02725)  Indication(s) Pleural Effusion  Consent Risks of the procedure as well as the alternatives and risks of each were explained to the patient and/or caregiver.  Consent for the procedure was obtained and is signed in the bedside chart  Anesthesia Topical only with 1% lidocaine    Time Out Verified patient identification, verified procedure, site/side was marked, verified correct patient position, special equipment/implants available, medications/allergies/relevant history reviewed, required imaging and test results available.   Sterile Technique Maximal sterile technique including full sterile barrier drape, hand hygiene, sterile gown, sterile gloves, mask, hair covering, sterile ultrasound probe cover (if used).  Procedure Description Ultrasound was used to identify appropriate pleural anatomy for placement and overlying skin marked.  Area of drainage cleaned and draped in sterile fashion. Lidocaine was used to anesthetize the skin and subcutaneous tissue.  800 cc's of cloudy, reddish-Pasquarelli appearing fluid was drained from the left pleural space. Catheter then removed and bandaid applied to site.   Complications/Tolerance None; patient tolerated the procedure well. Chest X-ray is ordered to confirm no post-procedural complication.   EBL Minimal   Specimen(s) Pleural fluid

## 2021-04-08 NOTE — H&P (Signed)
Synopsis: Referred in October 2022 for hospital follow up for pleural effusions  Subjective:   PATIENT ID: Rebecca Short GENDER: female DOB: Jan 11, 1941, MRN: [id number]   HPI  Rebecca Short is an 80 year old woman, never smoker with history of obstructive sleep apnea and atrial fibrillation who comes to hospital for thoracentesis for left pleural effusion.   CT Chest 10/31 shows residual left pleural effusion and patient has agreed to evaluate further with ultrasound with possible thoracentesis.   No acute complaints today.  Past Medical History:  Diagnosis Date   A-fib [facility])    Allergy    Atrial fibrillation (HCC)    FH: cholecystectomy 1997   Macular degeneration    OSA (obstructive sleep apnea)    mild with total AHI 7.25/hr and severe during REM sleep at 35/hr now on CPAP at 6cm H2O     Family History  Problem Relation Age of Onset   Osteoporosis Mother    Heart Problems Father    Stroke Father    Cancer Maternal Grandmother    Dementia Paternal Grandmother    Kidney failure Brother    Suicidality Other    Diabetes Brother    Heart attack Neg Hx      Social History   Socioeconomic History   Marital status: Married    Spouse name: Not on file   Number of children: Not on file   Years of education: Not on file   Highest education level: Not on file  Occupational History   Not on file  Tobacco Use   Smoking status: Never   Smokeless tobacco: Never  Vaping Use   Vaping Use: Never used  Substance and Sexual Activity   Alcohol use: Never   Drug use: Never   Sexual activity: Not on file  Other Topics Concern   Not on file  Social History Narrative   ** Merged History Encounter **       Social Determinants of Health   Financial Resource Strain: Not on file  Food Insecurity: Not on file  Transportation Needs: Not on file  Physical Activity: Not on file  Stress: Not on file  Social Connections: Not on file  Intimate Partner Violence: Not on file      Allergies  Allergen Reactions   Azithromycin Other (See Comments)    Pain in arm with IV infusion    Bactrim [Sulfamethoxazole-Trimethoprim] Rash   Condrolite [Glucosamine-Chondroitin-Msm] Rash     Outpatient Medications Prior to Visit  Medication Sig Dispense Refill   acetaminophen (TYLENOL) 325 MG tablet Take 2 tablets (650 mg total) by mouth every 6 (six) hours as needed for moderate pain or mild pain.     CALCIUM CITRATE-VITAMIN D PO Take 1 tablet by mouth at bedtime.     carboxymethylcellulose (REFRESH PLUS) 0.5 % SOLN Place 1 drop into both eyes 2 (two) times daily.     cholecalciferol (VITAMIN D3) 25 MCG (1000 UNIT) tablet Take 1,000 Units by mouth daily.     colchicine 0.6 MG tablet Take 1 tablet (0.6 mg total) by mouth daily. 30 tablet 1   ELIQUIS 5 MG TABS tablet TAKE 1 TABLET BY MOUTH TWICE A DAY (Patient taking differently: Take 5 mg by mouth 2 (two) times daily.) 60 tablet 6   EPIPEN 2-PAK 0.3 MG/0.3ML SOAJ injection Inject 0.3 mg into the muscle once as needed for anaphylaxis (severe allergic reaction).  0   furosemide (LASIX) 20 MG tablet Take 1 tablet (20 mg total)  by mouth daily. 90 tablet 1   Multiple Vitamin (MULTIVITAMIN WITH MINERALS) TABS tablet Take 1 tablet by mouth every morning.     Multiple Vitamins-Minerals (PRESERVISION AREDS PO) Take 1 capsule by mouth 2 (two) times daily.     pantoprazole (PROTONIX) 40 MG tablet Take 1 tablet (40 mg total) by mouth daily at 12 noon. 90 tablet 2   potassium chloride (KLOR-CON) 20 MEQ packet Take 20 mEq by mouth daily. 90 each 2   PRESCRIPTION MEDICATION every 14 (fourteen) days. Allergy shots administered by Dr. [place] Callas     atovaquone [facility]) 750 MG/5ML suspension Take 5 mLs (750 mg total) by mouth 2 (two) times daily with a meal. 210 mL 0   predniSONE (DELTASONE) 20 MG tablet Take 2 tablets (40 mg total) by mouth daily with breakfast. 60 tablet 1   No facility-administered medications prior to visit.    Review of  Systems  Constitutional:  Negative for chills, fever, malaise/fatigue and weight loss.  HENT:  Negative for congestion, sinus pain and sore throat.   Eyes: Negative.   Respiratory:  Negative for cough, hemoptysis, sputum production, shortness of breath and wheezing.   Cardiovascular:  Negative for chest pain, palpitations, orthopnea, claudication and leg swelling.  Gastrointestinal:  Negative for abdominal pain, heartburn, nausea and vomiting.  Genitourinary: Negative.   Musculoskeletal:  Negative for joint pain and myalgias.  Skin:  Negative for rash.  Neurological:  Negative for weakness.  Endo/Heme/Allergies: Negative.   Psychiatric/Behavioral: Negative.     Objective:   There were no vitals filed for this visit.  Physical Exam Constitutional:      General: She is not in acute distress.    Appearance: She is not ill-appearing.  HENT:     Head: Normocephalic and atraumatic.  Eyes:     General: No scleral icterus.    Conjunctiva/sclera: Conjunctivae normal.     Pupils: Pupils are equal, round, and reactive to light.  Cardiovascular:     Rate and Rhythm: Normal rate and regular rhythm.     Pulses: Normal pulses.     Heart sounds: Normal heart sounds. No murmur heard. Pulmonary:     Effort: Pulmonary effort is normal.     Breath sounds: Normal breath sounds. No wheezing, rhonchi or rales.  Abdominal:     General: Bowel sounds are normal.     Palpations: Abdomen is soft.  Musculoskeletal:     Right lower leg: No edema.     Left lower leg: No edema.  Lymphadenopathy:     Cervical: No cervical adenopathy.  Skin:    General: Skin is warm and dry.  Neurological:     General: No focal deficit present.     Mental Status: She is alert.  Psychiatric:        Mood and Affect: Mood normal.        Behavior: Behavior normal.        Thought Content: Thought content normal.        Judgment: Judgment normal.   CBC    Component Value Date/Time   WBC 12.0 (H) 01/30/2021 0222    RBC 4.52 01/30/2021 0222   HGB 13.2 01/30/2021 0222   HGB 15.7 12/27/2018 0831   HCT 39.7 01/30/2021 0222   HCT 47.4 (H) 12/27/2018 0831   PLT 347 01/30/2021 0222   PLT 213 12/27/2018 0831   MCV 87.8 01/30/2021 0222   MCV 90 12/27/2018 0831   MCH 29.2 01/30/2021 0222   MCHC 33.2 01/30/2021  0222   RDW 14.8 01/30/2021 0222   RDW 13.1 12/27/2018 0831   LYMPHSABS 1.2 01/19/2021 1803   MONOABS 1.0 01/19/2021 1803   EOSABS 0.0 01/19/2021 1803   BASOSABS 0.0 01/19/2021 1803   BMP Latest Ref Rng & Units 02/06/2021 01/30/2021 01/29/2021  Glucose 65 - 99 mg/dL 178(H) 124(H) 127(H)  BUN 8 - 27 mg/dL 27 26(H) 27(H)  Creatinine 0.57 - 1.00 mg/dL 0.84 0.86 0.80  BUN/Creat Ratio 12 - 28 32(H) - -  Sodium 134 - 144 mmol/L 139 135 136  Potassium 3.5 - 5.2 mmol/L 4.6 4.1 4.1  Chloride 96 - 106 mmol/L 98 101 106  CO2 20 - 29 mmol/L 25 28 18(L)  Calcium 8.7 - 10.3 mg/dL 8.8 8.5(L) 8.4(L)   Chest imaging: CXR 03/06/21 Resolved right-sided pleural effusion with persistent opacity on the left, likely a combination of persisting pleural fluid and associated atelectasis/consolidation  CXR 01/30/21 1. Slight interval decrease in size of the right pleural effusion with improved aeration of the right base. 2. No significant interval change of the larger left pleural effusion and adjacent airspace disease.  CT Chest 01/03/21 Moderate left and small right pleural effusions, which is increased on the left and new on the right in comparison to prior CT. Adjacent bibasilar and lingular atelectasis.   Increased, moderate-sized pericardial effusion. Biatrial enlargement.   Mild pulmonary edema.  PFT: No flowsheet data found.  Labs: 01/21/21: Quantiferon gold indeterminate  Path:  Echo 01/20/21:  1. Left ventricular ejection fraction, by estimation, is 60 to 65%. The  left ventricle has normal function. The left ventricle has no regional  wall motion abnormalities.   2. A small pericardial effusion  is present. There is no evidence of  cardiac tamponade.  Heart Catheterization:       Assessment & Plan:   Serositis Left Pleural Effusion  Discussion: Rebecca Short is an 80 year old woman, never smoker with history of obstructive sleep apnea and atrial fibrillation who comes to hospital for thoracentesis for left pleural effusion.   We will plan to perform pleural [place] and determine need for left pleural effusion.   Procedure has been reviewed with the patient and she is in agreement.    Freda Jackson, MD [facility] Office: [phone]  No current facility-administered medications for this encounter.

## 2021-04-09 ENCOUNTER — Encounter (HOSPITAL_COMMUNITY): Payer: Self-pay | Admitting: Pulmonary Disease

## 2021-04-09 ENCOUNTER — Telehealth: Payer: Self-pay | Admitting: Pulmonary Disease

## 2021-04-09 DIAGNOSIS — J90 Pleural effusion, not elsewhere classified: Secondary | ICD-10-CM

## 2021-04-09 LAB — CYTOLOGY - NON PAP

## 2021-04-09 NOTE — Telephone Encounter (Signed)
I have called the pt and LM on VM for her to call the office back.

## 2021-04-09 NOTE — Telephone Encounter (Signed)
Please let patient know that her pleural fluid studies continue to indicate an inflammatory process of the left pleural space.   The x-ray after her procedure showed a small effusions as well.   I would like her to come to the office to have her labs drawn for general inflammatory markers so we may need to consider going back up on steroids and tapering back down in a slower fashion. She can come in over the next week if possible to have blood drawn.  Orders have been placed for inflammatory work up.  Thanks,  Cletis Athens

## 2021-04-10 ENCOUNTER — Other Ambulatory Visit (INDEPENDENT_AMBULATORY_CARE_PROVIDER_SITE_OTHER): Payer: Medicare Other

## 2021-04-10 DIAGNOSIS — J90 Pleural effusion, not elsewhere classified: Secondary | ICD-10-CM | POA: Diagnosis not present

## 2021-04-10 LAB — CHOLESTEROL, BODY FLUID: Cholesterol, Fluid: 86 mg/dL

## 2021-04-10 LAB — SEDIMENTATION RATE: Sed Rate: 21 mm/hr (ref 0–30)

## 2021-04-10 LAB — C-REACTIVE PROTEIN: CRP: 7.4 mg/dL (ref 0.5–20.0)

## 2021-04-10 NOTE — Telephone Encounter (Signed)
I will place this call back in triage.  I am not able to call this person from a blocked number.

## 2021-04-10 NOTE — Telephone Encounter (Signed)
Patient is returning phone call. Patient phone number is [phone].

## 2021-04-10 NOTE — Telephone Encounter (Signed)
Spoke with pt and reviewed fluid and CXR results as dictated by Dr. Francine Graven. Informed pt of labs needing to be drawn as well. Pt stated she would come by the office and have labs drawn today. Nothing further needed at this time.   Routing to Dr. Francine Graven as Lorain Childes

## 2021-04-11 LAB — ANTI-DNA ANTIBODY, DOUBLE-STRANDED: ds DNA Ab: <1 IU/mL

## 2021-04-11 LAB — ANA: Anti Nuclear Antibody (ANA): NEGATIVE

## 2021-04-11 LAB — CYCLIC CITRUL PEPTIDE ANTIBODY, IGG: Cyclic Citrullin Peptide Ab: <16 UNITS

## 2021-04-11 LAB — RNP ANTIBODIES: ENA RNP Ab: <0.2 AI (ref 0.0–0.9)

## 2021-04-11 LAB — ANCA SCREEN W REFLEX TITER
ANCA Screen: POSITIVE — AB
Atypical P-ANCA titer: 1:80 {titer} — ABNORMAL HIGH

## 2021-04-11 LAB — ANTI-SMITH ANTIBODY: ENA SM Ab Ser-aCnc: <1 AI

## 2021-04-11 LAB — RHEUMATOID FACTOR: Rheumatoid fact SerPl-aCnc: 130 IU/mL — ABNORMAL HIGH (ref <14)

## 2021-04-12 LAB — BODY FLUID CULTURE W GRAM STAIN: Culture: NO GROWTH

## 2021-04-14 LAB — TRIGLYCERIDES, BODY FLUIDS: Triglycerides, Fluid: 32 mg/dL

## 2021-04-22 ENCOUNTER — Other Ambulatory Visit: Payer: Self-pay

## 2021-04-22 ENCOUNTER — Ambulatory Visit (INDEPENDENT_AMBULATORY_CARE_PROVIDER_SITE_OTHER): Payer: Medicare Other | Admitting: Primary Care

## 2021-04-22 ENCOUNTER — Telehealth: Payer: Self-pay | Admitting: Pulmonary Disease

## 2021-04-22 ENCOUNTER — Ambulatory Visit (INDEPENDENT_AMBULATORY_CARE_PROVIDER_SITE_OTHER): Payer: Medicare Other

## 2021-04-22 ENCOUNTER — Encounter: Payer: Self-pay | Admitting: Primary Care

## 2021-04-22 VITALS — BP 124/76 | HR 117 | Temp 98.0°F | Ht 62.0 in | Wt 179.0 lb

## 2021-04-22 DIAGNOSIS — I480 Paroxysmal atrial fibrillation: Secondary | ICD-10-CM

## 2021-04-22 DIAGNOSIS — J90 Pleural effusion, not elsewhere classified: Secondary | ICD-10-CM | POA: Diagnosis not present

## 2021-04-22 DIAGNOSIS — M069 Rheumatoid arthritis, unspecified: Secondary | ICD-10-CM | POA: Insufficient documentation

## 2021-04-22 DIAGNOSIS — R768 Other specified abnormal immunological findings in serum: Secondary | ICD-10-CM

## 2021-04-22 MED ORDER — PREDNISONE 20 MG PO TABS: 40.0000 mg | ORAL_TABLET | Freq: Every day | ORAL | 0 refills | Status: DC

## 2021-04-22 NOTE — Assessment & Plan Note (Signed)
-   HR 117, irregular  - Advised patient call cardiology to discuss restarting metoprolol 12.5mg  twice daily

## 2021-04-22 NOTE — Assessment & Plan Note (Addendum)
-   Recurrent pleural effusion s/p several thoracenteses. Pleural fluid consistent with exudative process. Cytology negative for malignancy but showed reactive mesothelia cells. Patient reports increased shortness of breath with associated shoulder pain. She is dyspneic while speaking. VSS, O2 97% on room air. CXR today showed increasing left pleural effusion. Continue lasix 20mg  daily for now. Recomend she hold Eliquis for 2 days, may need thoracentesis at follow-up in office with Dr. on 11/30.

## 2021-04-22 NOTE — Progress Notes (Signed)
Please let patient know CXR showed increase left effusion. Dr. Francine Graven, I told her to hold Eliquis for possible thoracentesis during visit she has with you on 11.30

## 2021-04-22 NOTE — Telephone Encounter (Signed)
Spoke with pt who states she currently uses IS twice a day for 10-15 mins daily but has noticed lately she is not able to achieve levels she had been able to at prior times.  Pt also c/o increased pain in shoulders and neck she believes is r/t prior pleural effusion. Pt stated she has talked with Dr. Francine Graven about pain but pt feels that has not been addressed. Pt c/o slightly increased SOB. Pt denies fever/chills/cough/wheezing/GI upset. Pt was scheduled with Dr. Francine Graven on 04/24/21 but wanted to be seen sooner so pt was scheduled with Ames Dura today 04/22/21 at 3pm. Pt was instructed to arrived at 2:45pm pt stated understanding. Nothing further needed at this time.    Routing to [employer] as Lorain Childes

## 2021-04-22 NOTE — Progress Notes (Signed)
@Patient  ID: , female    DOB: February 18, 1941, 79 y.o.   MRN: [id number]  Chief Complaint  Patient presents with   Follow-up    Referring provider: [id number], MD  HPI: 80 year old female, never smoked. PMH significant for afib, pericardial effusion, left pleural effusion, hypotension, OSA, CAP. Patient of Dr. 94, seen for initial consult on 03/06/21.   Previous [facility] pulmonary encounter: 03/06/21 Rebecca Short is an 80 year old woman, never smoker with history of obstructive sleep apnea and atrial fibrillation who comes to pulmonary clinic for follow up of serositis.   She was admitted 8/27 to 9/7 for pericardial effusion and bilateral pleural effusions. She is status post pericardial drain and multiple thoracenteses. The pleural fluid studies were consistent with exudative process which were initially neutrophil predominant that later became lymphocyte predominant. Cytology was negative for malignancy but showed reactive mesothelial cells.  She is being treated for serositis with prednisone taper and colchicine for the pericardial effusion. She has been on 40mg  of prednisone over the past month. She has been on pneumocystis prophylaxis with atovaquone, she developed skin rash with bactrim. Her inflammatory workup was rather unrevealing as ANA, CCP anti-histone ab and SSA/SSB were negative. Her rheumatoid factor was elevated at 57.2.   She is feeling significantly better since discharge is resuming her normal activity and is quite satisfied with her recovering thus far.   Chest radiograph today shows opacities at the left lung base. The right lung is significantly improved since prior study with resolution of right pleural effusion.   04/22/2021- Interim hx  Patient presents today for acute OV. She called our office today with reports of increased shortness of breath and increased pain in shoulder and neck. She is not able to use IS as well as she previously was. Shoulder  pain started in August 2022. Pain is bilateral and described as a shooting pain intermittent. Pain comes and goes. She is having to take tylenol as needed. She is currently on 5mg  prednisone. She becomes short winded with speaking. CXR on 04/08/21 showed cardiomegaly with increasing bilateral pleural effusions, left greater than right. Bibasilar airspace disease likely reflecting atelectasis, however, infection not excluded. Dr. September 2022 review imaging and ordered patient for inflammatory markers. ANCA was positive. May need to consider going back up on steroids and tapering back down in a slower fashion.    Allergies  Allergen Reactions   Azithromycin Other (See Comments)    Pain in arm with IV infusion    Bactrim [Sulfamethoxazole-Trimethoprim] Rash   Condrolite [Glucosamine-Chondroitin-Msm] Rash    Immunization History  Administered Date(s) Administered   Influenza-Unspecified 01/24/2014   PFIZER(Purple Top)SARS-COV-2 Vaccination 06/16/2019, 07/07/2019    Past Medical History:  Diagnosis Date   A-fib (HCC)    Allergy    Atrial fibrillation (HCC)    FH: cholecystectomy 1997   Macular degeneration    OSA (obstructive sleep apnea)    mild with total AHI 7.25/hr and severe during REM sleep at 35/hr now on CPAP at 6cm H2O    Tobacco History: Social History   Tobacco Use  Smoking Status Never  Smokeless Tobacco Never   Counseling given: Not Answered   Outpatient Medications Prior to Visit  Medication Sig Dispense Refill   acetaminophen (TYLENOL) 325 MG tablet Take 2 tablets (650 mg total) by mouth every 6 (six) hours as needed for moderate pain or mild pain.     CALCIUM CITRATE-VITAMIN D PO Take 1 tablet by mouth at  bedtime.     carboxymethylcellulose (REFRESH PLUS) 0.5 % SOLN Place 1 drop into both eyes 2 (two) times daily.     colchicine 0.6 MG tablet TAKE 1 TABLET BY MOUTH EVERY DAY 30 tablet 1   ELIQUIS 5 MG TABS tablet TAKE 1 TABLET BY MOUTH TWICE A DAY (Patient taking  differently: Take 5 mg by mouth 1 day or 1 dose.) 60 tablet 6   EPIPEN 2-PAK 0.3 MG/0.3ML SOAJ injection Inject 0.3 mg into the muscle once as needed for anaphylaxis (severe allergic reaction).  0   furosemide (LASIX) 20 MG tablet Take 1 tablet (20 mg total) by mouth daily. 90 tablet 1   Multiple Vitamin (MULTIVITAMIN WITH MINERALS) TABS tablet Take 1 tablet by mouth every morning.     Multiple Vitamins-Minerals (PRESERVISION AREDS PO) Take 1 capsule by mouth 2 (two) times daily.     pantoprazole (PROTONIX) 40 MG tablet Take 1 tablet (40 mg total) by mouth daily at 12 noon. (Patient taking differently: Take 40 mg by mouth daily.) 90 tablet 2   potassium chloride (KLOR-CON) 20 MEQ packet Take 20 mEq by mouth daily. 90 each 2   PRESCRIPTION MEDICATION every 14 (fourteen) days. Allergy shots administered by Dr. [place] Callas     predniSONE (DELTASONE) 10 MG tablet Take 3 tablets (30 mg total) by mouth daily with breakfast for 14 days, THEN 2 tablets (20 mg total) daily with breakfast for 14 days, THEN 1 tablet (10 mg total) daily with breakfast for 14 days, THEN 0.5 tablets (5 mg total) daily with breakfast for 7 days. 20 tablet 0   No facility-administered medications prior to visit.   Review of Systems  Review of Systems  Constitutional:  Positive for fatigue.  Respiratory:  Positive for shortness of breath.   Cardiovascular:  Positive for leg swelling.  Musculoskeletal:  Positive for arthralgias.    Physical Exam  BP 124/76 (BP Location: Left Arm, Patient Position: Sitting, Cuff Size: Normal)   Pulse (!) 117   Temp 98 F (36.7 C) (Oral)   Ht 5\' 2"  (1.575 m)   Wt 179 lb (81.2 kg)   SpO2 97%   BMI 32.74 kg/m  Physical Exam Constitutional:      Appearance: Normal appearance.  HENT:     Head: Normocephalic and atraumatic.     Mouth/Throat:     Mouth: Mucous membranes are moist.     Pharynx: Oropharynx is clear.  Cardiovascular:     Rate and Rhythm: Normal rate. Rhythm irregular.      Comments: 2-3 BLE edema Pulmonary:     Effort: Pulmonary effort is normal.     Breath sounds: Normal breath sounds. No wheezing.     Comments: Diminished left base  Musculoskeletal:        General: Normal range of motion.  Skin:    General: Skin is warm and dry.  Neurological:     General: No focal deficit present.     Mental Status: She is alert and oriented to person, place, and time. Mental status is at baseline.  Psychiatric:        Mood and Affect: Mood normal.        Behavior: Behavior normal.        Thought Content: Thought content normal.        Judgment: Judgment normal.     Lab Results:  CBC    Component Value Date/Time   WBC 12.0 (H) 01/30/2021 0222   RBC 4.52 01/30/2021 0222  HGB 13.2 01/30/2021 0222   HGB 15.7 12/27/2018 0831   HCT 39.7 01/30/2021 0222   HCT 47.4 (H) 12/27/2018 0831   PLT 347 01/30/2021 0222   PLT 213 12/27/2018 0831   MCV 87.8 01/30/2021 0222   MCV 90 12/27/2018 0831   MCH 29.2 01/30/2021 0222   MCHC 33.2 01/30/2021 0222   RDW 14.8 01/30/2021 0222   RDW 13.1 12/27/2018 0831   LYMPHSABS 1.2 01/19/2021 1803   MONOABS 1.0 01/19/2021 1803   EOSABS 0.0 01/19/2021 1803   BASOSABS 0.0 01/19/2021 1803    BMET    Component Value Date/Time   NA 139 02/06/2021 1557   K 4.6 02/06/2021 1557   CL 98 02/06/2021 1557   CO2 25 02/06/2021 1557   GLUCOSE 178 (H) 02/06/2021 1557   GLUCOSE 124 (H) 01/30/2021 0222   BUN 27 02/06/2021 1557   CREATININE 0.84 02/06/2021 1557   CALCIUM 8.8 02/06/2021 1557   GFRNONAA >60 01/30/2021 0222   GFRAA 90 12/27/2018 0831    BNP    Component Value Date/Time   BNP 53.4 01/23/2021 0553    ProBNP    Component Value Date/Time   PROBNP 52.3 01/10/2010 0000    Imaging: DG Chest 2 View  Result Date: 04/22/2021 CLINICAL DATA:  An 80 year old female presents for evaluation of pleural effusion with increased shortness of breath. EXAM: CHEST - 2 VIEW COMPARISON:  April 08, 2021. FINDINGS: Increased  density in the LEFT chest when compared to the previous chest x-ray. LEFT heart border is obscured as is the LEFT hemidiaphragm. Meniscus extends to approximately the level of the LEFT seventh rib on the frontal view. Mild increased interstitial markings throughout the chest. Blunting of RIGHT costodiaphragmatic sulcus with linear airspace disease at the RIGHT lung base. On limited assessment there is no acute skeletal process. IMPRESSION: Increasing LEFT effusion and associated airspace disease. Correlate with any signs of infection. Given persistence/worsening underlying pulmonary or bronchial lesion would be difficult to exclude. Correlate with results of prior thoracentesis and consider follow-up chest CT for further evaluation as warranted. Trace RIGHT effusion with basilar atelectasis. Electronically Signed   By: Zetta Bills M.D.   On: 04/22/2021 15:52   CT Chest W Contrast  Result Date: 03/26/2021 CLINICAL DATA:  Left lung base opacity. EXAM: CT CHEST WITH CONTRAST TECHNIQUE: Multidetector CT imaging of the chest was performed during intravenous contrast administration. CONTRAST:  41mL ISOVUE-300 IOPAMIDOL (ISOVUE-300) INJECTION 61% COMPARISON:  Chest radiograph dated 03/06/2021 and CT dated 01/03/2021. FINDINGS: Cardiovascular: There is mild cardiomegaly. No pericardial effusion. There is cyst of the mediastinum into the left hemithorax. Mild atherosclerotic calcification of the thoracic aorta. No aneurysmal dilatation or dissection. The origins of the great vessels of the aortic arch appear patent as visualized. The central pulmonary arteries appear patent. Mediastinum/Nodes: Left hilar calcified granuloma. No adenopathy. The esophagus is grossly unremarkable. No mediastinal fluid collection. Lungs/Pleura: Small left pleural effusion. There is partial consolidative changes of the left lower lobe with overall volume loss which may represent atelectasis. Pneumonia or underlying mass is not excluded.  Clinical correlation and follow-up to resolution recommended. There is a small right pleural effusion and minimal right lung base atelectasis. No pneumothorax. The central airways are patent. 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 Cholecystectomy and mild biliary ductal dilatation. Musculoskeletal: Osteopenia with degenerative changes of the spine. No acute osseous pathology. IMPRESSION: 1. Small left pleural effusion with partial consolidative changes  of the left lower lobe with overall volume loss which may represent atelectasis. Pneumonia or underlying mass is not excluded. Clinical correlation and follow-up to resolution recommended. 2. Small right pleural effusion and minimal right lung base atelectasis. 3. Mild cardiomegaly. 4. Aortic Atherosclerosis (ICD10-I70.0). Electronically Signed   By: Anner Crete M.D.   On: 03/26/2021 01:08   DG CHEST PORT 1 VIEW  Result Date: 04/08/2021 CLINICAL DATA:  Two-view chest x-ray/12/22 EXAM: PORTABLE CHEST 1 VIEW COMPARISON:  None. FINDINGS: Heart is enlarged. Atherosclerotic changes are noted at the aortic arch. Left pleural effusion is increased. New right pleural effusion is present. Bibasilar airspace disease is present. IMPRESSION: 1. Cardiomegaly with increasing bilateral pleural effusions, left greater than right. 2. Bibasilar airspace disease likely reflects atelectasis. Infection is not excluded. Electronically Signed   By: San Morelle M.D.   On: 04/08/2021 15:35     Assessment & Plan:   Pleural effusion on left - Recurrent pleural effusion s/p several thoracenteses. Pleural fluid consistent with exudative process. Cytology negative for malignancy but showed reactive mesothelia cells. Patient reports increased shortness of breath with associated shoulder pain. She is dyspneic while speaking. VSS, O2 97% on room air. CXR today showed increasing left pleural  effusion. Continue lasix 20mg  daily for now. Recomend she hold Eliquis for 2 days, may need thoracentesis at follow-up in office with Dr. Erin Fulling on 11/30.   Rheumatoid factor positive - Patient has symptoms of arthralgia. 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 Advised patient increase prednisone 40mg  daily, length of taper to be decided by Dr. Erin Fulling. Referring to rheumatology.   Paroxysmal a-fib - HR 117, irregular  - Advised patient call cardiology to discuss restarting metoprolol 12.5mg  twice daily   40 mins spent on case; > 50% face to face with patient   Martyn Ehrich, NP 04/22/2021

## 2021-04-22 NOTE — Patient Instructions (Addendum)
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 Call cardiology to discuss restarting metoprolol 12.5mg  twice daily  Continue lasix 20mg  daily   Referral: Rheumatology (ordered placed)  Orders: CXR today (ordered)  Follow-up: Keep apt with Dr. Francine Graven

## 2021-04-22 NOTE — Assessment & Plan Note (Addendum)
-   Patient has symptoms of arthralgia. 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 Advised patient increase prednisone 40mg  daily, length of taper to be decided by Dr. . Referring to rheumatology.

## 2021-04-23 ENCOUNTER — Telehealth: Payer: Self-pay | Admitting: Interventional Cardiology

## 2021-04-23 MED ORDER — METOPROLOL TARTRATE 25 MG PO TABS: 12.5000 mg | ORAL_TABLET | Freq: Every day | ORAL | 1 refills | Status: DC

## 2021-04-23 NOTE — Telephone Encounter (Signed)
Pt c/o medication issue:  1. Name of Medication: colchicine 0.6 MG tablet  2. How are you currently taking this medication (dosage and times per day)? TAKE 1 TABLET BY MOUTH EVERY DAY  3. Are you having a reaction (difficulty breathing--STAT)? no  4. What is your medication issue? needs to confirm that at the end of november to stop taking colchicine 0.6 MG tablet or not    STAT if HR is under 50 or over 120 (normal HR is 60-100 beats per minute)  What is your heart rate? Not sure   Do you have a log of your heart rate readings (document readings)? 91, 107, 103, 107, 100  Do you have any other symptoms? yesterday was at the [facility] office and pa suggested pt start taking 12.5mg  of metoprolol twice a day once in the morning and at night because hr was up.. pt says that her hr has been up and she does agree with this recommendation if Dr. 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 pt would like to know if there are other tests that need to be ran to find out what other vessels are effected and how to proceed.

## 2021-04-23 NOTE — Telephone Encounter (Signed)
Pt is calling in with a couple different questions/concerns she would like Dr. Hoyle Barr input on:  1.)  pt is calling to confirm how long she is to be taking her colchicine regimen for.  She last saw Dr. Eldridge Dace back in 09/14.  Confirmed with the pt that she was to take a 3 month course of colchicine for pericarditis noted in hospitalization back in August.  Endorsed to the pt that per Dr. Eldridge Dace, at last OV with the pt he advised that she plan for 3 months course of colchicine for pericarditis, and provided her a refill for this medication at that time.  Advised her to complete her supply/refill he sent in at last OV on 9/14.  Pt verbalized understanding and agrees with this plan.  2.)  Pt is calling to let Dr. Eldridge Dace know that she saw Pulmonology yesterday (OV note visible in Epic).  Pt states at the visit yesterday, her HR was 117 irregular BP -124/76, and Ames Dura NP advised that she discuss with Dr. Eldridge Dace about restarting metoprolol 12.5 mg po bid.  She wants to know if Dr. Eldridge Dace agrees that she start back metoprolol 12.5 mg bid, for she has a long-standing history with issues in taking beta blockers associated with pressures dropping.  She wants his input on this.   3.)  Pt also calling to let Dr. Eldridge Dace know that at yesterdays OV with Pulm, they said pt has symptoms of arthralgia and RF was elevated and ANCA positive, with concerns of vasculitis.  NP advised the pt to increase prednisone to 40 mg po daily, length of taper to be decided by Dr. Francine Graven when he see's the pt on 11/30.  Pt was also referred to Rheumatology at yesterday's OV with Pulm. Pt will see Dr. Francine Graven on 11/30 for follow-up of recurrent pleural effusion s/p several thoracentesis.  Pulm recommended pt to hold her eliquis x 2 days at yesterday's OV, for she may need thoracentesis at follow-up appt with Dr. Francine Graven on 11/30.  Pt is wanting Dr. Eldridge Dace to review OV notes with Pulmonology from yesterday in regards to  all of this, and advise if he agrees with this plan, and if any additional testing should be ordered, other than what NP advised at yesterday's OV with the pt.   Pt states she will be seeing Dr. Francine Graven with Pulmonology tomorrow 11/30, incase Dr. Eldridge Dace or RN call back at that time.  She advised to leave a detailed message and she will call back as soon as she gets back home.   Informed the pt that I will route this message to both Dr. Eldridge Dace and his RN to further review, advise, and follow-up with the pt accordingly thereafter.  Pt verbalized understanding and agrees with this plan.

## 2021-04-23 NOTE — Telephone Encounter (Signed)
Pt aware of recommendations per Dr. Eldridge Dace.  Pt will start taking metoprolol tartrate 12.5 mg po daily, see how she responds to taking it once daily and after her thoracentesis tomorrow, and if doing well at that time, will increase to taking it BID.  Pt is aware that Dr. Eldridge Dace is ok with her holding her eliquis for 2 days as advised at yesterday's OV with Pulm, in preparation of thoracentesis tomorrow 11/30.  Did inform her that prednisone increase may increase her BP/HR, so restarting that lopressor at 12.5 mg po daily then increase as tolerated to BID was advised by Dr. Eldridge Dace.  Also informed the pt that Dr. Eldridge Dace said there is no need for additional testing from his standpoint.  Pt states she still has enough metoprolol 12.5 mg on hand, and will use this supply up first, see how she tolerates this, and will notify our office when refills are needed.  Placed this in med note and pt will call at a later time for refills, if needed. Pt verbalized understanding and agrees with this plan.  Pt was more than gracious for all the assistance provided.

## 2021-04-23 NOTE — Telephone Encounter (Signed)
I am ok with her holding the ELiquis for 2 days for thoracentesis.  Increasing Prednisone may increase her BP and HR so I think starting the metoprolol at 12.5 mg daily first is ok.  See how she responds to the once a day and after the thoracentesis, could increase to BID dosing.   No need for additional testing from my standpoint.

## 2021-04-24 ENCOUNTER — Other Ambulatory Visit: Payer: Self-pay | Admitting: Interventional Cardiology

## 2021-04-24 ENCOUNTER — Ambulatory Visit (INDEPENDENT_AMBULATORY_CARE_PROVIDER_SITE_OTHER): Payer: Medicare Other | Admitting: Pulmonary Disease

## 2021-04-24 ENCOUNTER — Encounter: Payer: Self-pay | Admitting: Pulmonary Disease

## 2021-04-24 ENCOUNTER — Ambulatory Visit (HOSPITAL_COMMUNITY)
Admission: RE | Admit: 2021-04-24 | Discharge: 2021-04-24 | Disposition: A | Discharge status: Home / Self Care | Payer: Medicare Other | Source: Ambulatory Visit | Attending: Pulmonary Disease | Admitting: Pulmonary Disease

## 2021-04-24 ENCOUNTER — Ambulatory Visit (HOSPITAL_COMMUNITY): Payer: Medicare Other

## 2021-04-24 ENCOUNTER — Other Ambulatory Visit: Payer: Self-pay

## 2021-04-24 ENCOUNTER — Encounter (HOSPITAL_COMMUNITY)
Admission: RE | Disposition: A | Discharge status: Home / Self Care | Payer: Self-pay | Source: Ambulatory Visit | Attending: Pulmonary Disease

## 2021-04-24 VITALS — BP 126/72 | HR 102 | Ht 62.0 in | Wt 181.4 lb

## 2021-04-24 DIAGNOSIS — J90 Pleural effusion, not elsewhere classified: Secondary | ICD-10-CM

## 2021-04-24 DIAGNOSIS — K658 Other peritonitis: Secondary | ICD-10-CM

## 2021-04-24 HISTORY — PX: THORACENTESIS: SHX235

## 2021-04-24 LAB — BODY FLUID CELL COUNT WITH DIFFERENTIAL
Eos, Fluid: 0 %
Lymphs, Fluid: 55 %
Monocyte-Macrophage-Serous Fluid: 0 % — ABNORMAL LOW (ref 50–90)
Neutrophil Count, Fluid: 45 % — ABNORMAL HIGH (ref 0–25)
Total Nucleated Cell Count, Fluid: 816 cu mm (ref 0–1000)

## 2021-04-24 LAB — PROTEIN, PLEURAL OR PERITONEAL FLUID: Total protein, fluid: 3.1 g/dL

## 2021-04-24 LAB — GLUCOSE, PLEURAL OR PERITONEAL FLUID: Glucose, Fluid: 152 mg/dL

## 2021-04-24 LAB — LACTATE DEHYDROGENASE, PLEURAL OR PERITONEAL FLUID: LD, Fluid: 61 U/L — ABNORMAL HIGH (ref 3–23)

## 2021-04-24 LAB — ALBUMIN, PLEURAL OR PERITONEAL FLUID: Albumin, Fluid: 1.9 g/dL

## 2021-04-24 SURGERY — THORACENTESIS
Anesthesia: LOCAL

## 2021-04-24 NOTE — Progress Notes (Signed)
Cxr done results called to [facility], ok to d/c home

## 2021-04-24 NOTE — Progress Notes (Signed)
Synopsis: Referred in October 2022 for hospital follow up for pleural effusions  Subjective:   PATIENT ID: Rebecca Short GENDER: female DOB: 02/18/1941, MRN: [id number]   HPI  Chief Complaint  Patient presents with   Follow-up    2 day f/u for pleural effusion on left side. Still having increased SOB. Denies any new chest pain since Monday.    Rebecca Short is an 80 year old woman, never smoker with history of obstructive sleep apnea and atrial fibrillation who returns to pulmonary clinic for follow up of serositis.   She was seen in acute visit on 11/28 by Derl Barrow, NP for increasing shortness of breath and pleuritic chest pain. She had tapered down to 5mg  of prednisone at this time. She had repeat thoracentesis on 04/08/21 based on CT chest from 04/22/21 which continued to show persistent left pleural effusion. Post-thoracentesis x-ray showed left pleural effusion and small right pleural effusion. Chest x-ray on 11/28 shows increased left pleural effusion.   Patient complains of increasing shortness of breath, bilateral shoulder pain and increasing left-sided discomfort.  Labs from visit on 11/16 showed rheumatoid factor I30 previously 57.2 on 01/21/2021.  P ANCA titer was 1:80 on 04/10/2021.  The following labs were negative on 04/10/2021: ANA, anti-CCP, dsDNA antibody and previously negative for SSA and SSB and antihistone antibodies.  OV 03/06/21 She was admitted 8/27 to 9/7 for pericardial effusion and bilateral pleural effusions. She is status post pericardial drain and multiple thoracenteses. The pleural fluid studies were consistent with exudative process which were initially neutrophil predominant that later became lymphocyte predominant. Cytology was negative for malignancy but showed reactive mesothelial cells.  She is being treated for serositis with prednisone taper and colchicine for the pericardial effusion. She has been on 40mg  of prednisone over the past month. She has  been on pneumocystis prophylaxis with atovaquone, she developed skin rash with bactrim. Her inflammatory workup was rather unrevealing as ANA, CCP anti-histone ab and SSA/SSB were negative. Her rheumatoid factor was elevated at 57.2.   She is feeling significantly better since discharge is resuming her normal activity and is quite satisfied with her recovering thus far.   Chest radiograph today shows opacities at the left lung base. The right lung is significantly improved since prior study with resolution of right pleural effusion.  Past Medical History:  Diagnosis Date   A-fib [facility])    Allergy    Atrial fibrillation (HCC)    FH: cholecystectomy 1997   Macular degeneration    OSA (obstructive sleep apnea)    mild with total AHI 7.25/hr and severe during REM sleep at 35/hr now on CPAP at 6cm H2O     Family History  Problem Relation Age of Onset   Osteoporosis Mother    Heart Problems Father    Stroke Father    Cancer Maternal Grandmother    Dementia Paternal Grandmother    Kidney failure Brother    Suicidality Other    Diabetes Brother    Heart attack Neg Hx      Social History   Socioeconomic History   Marital status: Married    Spouse name: Not on file   Number of children: Not on file   Years of education: Not on file   Highest education level: Not on file  Occupational History   Not on file  Tobacco Use   Smoking status: Never   Smokeless tobacco: Never  Vaping Use   Vaping Use: Never used  Substance and  Sexual Activity   Alcohol use: Never   Drug use: Never   Sexual activity: Not on file  Other Topics Concern   Not on file  Social History Narrative   ** Merged History Encounter **       Social Determinants of Health   Financial Resource Strain: Not on file  Food Insecurity: Not on file  Transportation Needs: Not on file  Physical Activity: Not on file  Stress: Not on file  Social Connections: Not on file  Intimate Partner Violence: Not on file      Allergies  Allergen Reactions   Azithromycin Other (See Comments)    Pain in arm with IV infusion    Bactrim [Sulfamethoxazole-Trimethoprim] Rash   Condrolite [Glucosamine-Chondroitin-Msm] Rash     Outpatient Medications Prior to Visit  Medication Sig Dispense Refill   acetaminophen (TYLENOL) 325 MG tablet Take 2 tablets (650 mg total) by mouth every 6 (six) hours as needed for moderate pain or mild pain.     CALCIUM CITRATE-VITAMIN D PO Take 1 tablet by mouth at bedtime.     carboxymethylcellulose (REFRESH PLUS) 0.5 % SOLN Place 1 drop into both eyes 2 (two) times daily.     colchicine 0.6 MG tablet TAKE 1 TABLET BY MOUTH EVERY DAY 30 tablet 1   ELIQUIS 5 MG TABS tablet TAKE 1 TABLET BY MOUTH TWICE A DAY (Patient taking differently: Take 5 mg by mouth 1 day or 1 dose.) 60 tablet 6   EPIPEN 2-PAK 0.3 MG/0.3ML SOAJ injection Inject 0.3 mg into the muscle once as needed for anaphylaxis (severe allergic reaction).  0   furosemide (LASIX) 20 MG tablet Take 1 tablet (20 mg total) by mouth daily. 90 tablet 1   metoprolol tartrate (LOPRESSOR) 25 MG tablet Take 0.5 tablets (12.5 mg total) by mouth daily. May increase to twice daily as tolerated. 30 tablet 1   Multiple Vitamin (MULTIVITAMIN WITH MINERALS) TABS tablet Take 1 tablet by mouth every morning.     Multiple Vitamins-Minerals (PRESERVISION AREDS PO) Take 1 capsule by mouth 2 (two) times daily.     pantoprazole (PROTONIX) 40 MG tablet Take 1 tablet (40 mg total) by mouth daily at 12 noon. (Patient taking differently: Take 40 mg by mouth daily.) 90 tablet 2   potassium chloride (KLOR-CON) 20 MEQ packet Take 20 mEq by mouth daily. 90 each 2   predniSONE (DELTASONE) 20 MG tablet Take 2 tablets (40 mg total) by mouth daily with breakfast. 30 tablet 0   PRESCRIPTION MEDICATION every 14 (fourteen) days. Allergy shots administered by Dr. Donneta Romberg     No facility-administered medications prior to visit.    Review of Systems  Constitutional:   Negative for chills, fever, malaise/fatigue and weight loss.  HENT:  Negative for congestion, sinus pain and sore throat.   Eyes: Negative.   Respiratory:  Positive for shortness of breath. Negative for cough, hemoptysis, sputum production and wheezing.   Cardiovascular:  Positive for chest pain. Negative for palpitations, orthopnea, claudication and leg swelling.  Gastrointestinal:  Negative for abdominal pain, heartburn, nausea and vomiting.  Genitourinary: Negative.   Musculoskeletal:  Positive for joint pain (bilateral shoulders). Negative for myalgias.  Skin:  Negative for rash.  Neurological:  Negative for weakness.  Endo/Heme/Allergies: Negative.   Psychiatric/Behavioral: Negative.     Objective:   Vitals:   04/24/21 0905  BP: 126/72  Pulse: (!) 102  SpO2: 97%  Weight: 181 lb 6.4 oz (82.3 kg)  Height: 5\' 2"  (1.575 m)  Physical Exam Constitutional:      General: She is not in acute distress.    Appearance: She is not ill-appearing.  HENT:     Head: Normocephalic and atraumatic.  Eyes:     General: No scleral icterus.    Conjunctiva/sclera: Conjunctivae normal.     Pupils: Pupils are equal, round, and reactive to light.  Cardiovascular:     Rate and Rhythm: Normal rate and regular rhythm.     Pulses: Normal pulses.     Heart sounds: Normal heart sounds. No murmur heard. Pulmonary:     Effort: Pulmonary effort is normal.     Breath sounds: Examination of the left-middle field reveals decreased breath sounds. Examination of the left-lower field reveals decreased breath sounds. Decreased breath sounds present. No wheezing, rhonchi or rales.  Abdominal:     General: Bowel sounds are normal.     Palpations: Abdomen is soft.  Musculoskeletal:     Right lower leg: No edema.     Left lower leg: No edema.  Lymphadenopathy:     Cervical: No cervical adenopathy.  Skin:    General: Skin is warm and dry.  Neurological:     General: No focal deficit present.     Mental  Status: She is alert.  Psychiatric:        Mood and Affect: Mood normal.        Behavior: Behavior normal.        Thought Content: Thought content normal.        Judgment: Judgment normal.   CBC    Component Value Date/Time   WBC 12.0 (H) 01/30/2021 0222   RBC 4.52 01/30/2021 0222   HGB 13.2 01/30/2021 0222   HGB 15.7 12/27/2018 0831   HCT 39.7 01/30/2021 0222   HCT 47.4 (H) 12/27/2018 0831   PLT 347 01/30/2021 0222   PLT 213 12/27/2018 0831   MCV 87.8 01/30/2021 0222   MCV 90 12/27/2018 0831   MCH 29.2 01/30/2021 0222   MCHC 33.2 01/30/2021 0222   RDW 14.8 01/30/2021 0222   RDW 13.1 12/27/2018 0831   LYMPHSABS 1.2 01/19/2021 1803   MONOABS 1.0 01/19/2021 1803   EOSABS 0.0 01/19/2021 1803   BASOSABS 0.0 01/19/2021 1803   BMP Latest Ref Rng & Units 02/06/2021 01/30/2021 01/29/2021  Glucose 65 - 99 mg/dL 297(L) 892(J) 194(R)  BUN 8 - 27 mg/dL 27 74(Y) 81(K)  Creatinine 0.57 - 1.00 mg/dL 4.81 8.56 3.14  BUN/Creat Ratio 12 - 28 32(H) - -  Sodium 134 - 144 mmol/L 139 135 136  Potassium 3.5 - 5.2 mmol/L 4.6 4.1 4.1  Chloride 96 - 106 mmol/L 98 101 106  CO2 20 - 29 mmol/L 25 28 18(L)  Calcium 8.7 - 10.3 mg/dL 8.8 9.7(W) 2.6(V)   Chest imaging: CXR 04/22/21 Increasing LEFT effusion and associated airspace disease. Correlate with any signs of infection. Given persistence/worsening underlying pulmonary or bronchial lesion would be difficult to exclude. Correlate with results of prior thoracentesis and consider follow-up chest CT for further evaluation as warranted.   Trace RIGHT effusion with basilar atelectasis.  CXR 03/06/21 Resolved right-sided pleural effusion with persistent opacity on the left, likely a combination of persisting pleural fluid and associated atelectasis/consolidation  CXR 01/30/21 1. Slight interval decrease in size of the right pleural effusion with improved aeration of the right base. 2. No significant interval change of the larger left pleural effusion  and adjacent airspace disease.  CT Chest 01/03/21 Moderate left and small right  pleural effusions, which is increased on the left and new on the right in comparison to prior CT. Adjacent bibasilar and lingular atelectasis.   Increased, moderate-sized pericardial effusion. Biatrial enlargement.   Mild pulmonary edema.  PFT: No flowsheet data found.  Labs: 01/21/21: Quantiferon gold indeterminate  Path:  Echo 01/20/21:  1. Left ventricular ejection fraction, by estimation, is 60 to 65%. The  left ventricle has normal function. The left ventricle has no regional  wall motion abnormalities.   2. A small pericardial effusion is present. There is no evidence of  cardiac tamponade.  Heart Catheterization:   Assessment & Plan:   Serositis ([place]) - Plan: Mpo/pr-3 (anca) antibodies, Mpo/pr-3 (anca) antibodies  Pleural effusion on left - Plan: Mpo/pr-3 (anca) antibodies, Mpo/pr-3 (anca) antibodies  Discussion: Rebecca Short is an 80 year old woman, never smoker with history of obstructive sleep apnea and atrial fibrillation who returns to pulmonary clinic for follow up of serositis  She has had significant clinical improvement and resolution of her dyspnea with high dose steroids. Once tapered to 20mg  daily it appears she started to generate increasing pleural effusions and her issues with shoulder pain presented. Further tapering of steroids resulted in increasing left effusion with return of dyspnea.   Given her elevated RF and mildly elevated P-ANCA titer, there is concern for rheumatoid arthritis vs. Vasculitis involvement. We will check MPO/PR3 titers today.   Rheumatology referral has been placed for further input and management.   She has been resumed on 40mg  of prednisone daily for now. She will need pneumocystis prophylaxis starting in the next week or two with atovaquone.   She has been scheduled for thoracentesis later today for symptomatic relief.   Very challenging case.  All of the lab results and imaging were reviewed with the patient, her husband and friend. We also discussed that bronchoscopy for transbronchial lung biopsies would be of low yield at this time and post higher risks for the patient.  Follow-up in 4 weeks.  Freda Jackson, MD [facility] Office: [phone]   Current Outpatient Medications:    acetaminophen (TYLENOL) 325 MG tablet, Take 2 tablets (650 mg total) by mouth every 6 (six) hours as needed for moderate pain or mild pain., Disp: , Rfl:    CALCIUM CITRATE-VITAMIN D PO, Take 1 tablet by mouth at bedtime., Disp: , Rfl:    carboxymethylcellulose (REFRESH PLUS) 0.5 % SOLN, Place 1 drop into both eyes 2 (two) times daily., Disp: , Rfl:    colchicine 0.6 MG tablet, TAKE 1 TABLET BY MOUTH EVERY DAY, Disp: 30 tablet, Rfl: 1   ELIQUIS 5 MG TABS tablet, TAKE 1 TABLET BY MOUTH TWICE A DAY (Patient taking differently: Take 5 mg by mouth 1 day or 1 dose.), Disp: 60 tablet, Rfl: 6   EPIPEN 2-PAK 0.3 MG/0.3ML SOAJ injection, Inject 0.3 mg into the muscle once as needed for anaphylaxis (severe allergic reaction)., Disp: , Rfl: 0   furosemide (LASIX) 20 MG tablet, Take 1 tablet (20 mg total) by mouth daily., Disp: 90 tablet, Rfl: 1   metoprolol tartrate (LOPRESSOR) 25 MG tablet, Take 0.5 tablets (12.5 mg total) by mouth daily. May increase to twice daily as tolerated., Disp: 30 tablet, Rfl: 1   Multiple Vitamin (MULTIVITAMIN WITH MINERALS) TABS tablet, Take 1 tablet by mouth every morning., Disp: , Rfl:    Multiple Vitamins-Minerals (PRESERVISION AREDS PO), Take 1 capsule by mouth 2 (two) times daily., Disp: , Rfl:    pantoprazole (PROTONIX)  40 MG tablet, Take 1 tablet (40 mg total) by mouth daily at 12 noon. (Patient taking differently: Take 40 mg by mouth daily.), Disp: 90 tablet, Rfl: 2   potassium chloride (KLOR-CON) 20 MEQ packet, Take 20 mEq by mouth daily., Disp: 90 each, Rfl: 2   predniSONE (DELTASONE) 20 MG tablet, Take  2 tablets (40 mg total) by mouth daily with breakfast., Disp: 30 tablet, Rfl: 0   PRESCRIPTION MEDICATION, every 14 (fourteen) days. Allergy shots administered by Dr. Donneta Romberg, Disp: , Rfl:

## 2021-04-24 NOTE — Patient Instructions (Signed)
We will check MPO/PR3 titers   Continue on 40mg  prednisone daily. We will need to resume atovaquone in 1-2 weeks.   We will try to get you scheduled for thoracentesis today at [facility]  We will speak with Rheumatology to get you an appointment in the near future.

## 2021-04-24 NOTE — H&P (Signed)
[facility]  HPI: pleasant 80 y/o female recently hospitalized in 02/2021 in the setting of dyspnea, has had recurrent pleural effusions requiring thoracentesis.  PCCM has been managing with repeated thoracentesis and work up for rheumatologic conditions.  She has been treated with steroids.  Her left effusion has recurred.  She is here for a thoracentesis.  She held eliquis on Monday.  Past Medical History:  Diagnosis Date   A-fib [facility])    Allergy    Atrial fibrillation (HCC)    FH: cholecystectomy 1997   Macular degeneration    OSA (obstructive sleep apnea)    mild with total AHI 7.25/hr and severe during REM sleep at 35/hr now on CPAP at 6cm H2O     Family History  Problem Relation Age of Onset   Osteoporosis Mother    Heart Problems Father    Stroke Father    Cancer Maternal Grandmother    Dementia Paternal Grandmother    Kidney failure Brother    Suicidality Other    Diabetes Brother    Heart attack Neg Hx      Social History   Socioeconomic History   Marital status: Married    Spouse name: Not on file   Number of children: Not on file   Years of education: Not on file   Highest education level: Not on file  Occupational History   Not on file  Tobacco Use   Smoking status: Never   Smokeless tobacco: Never  Vaping Use   Vaping Use: Never used  Substance and Sexual Activity   Alcohol use: Never   Drug use: Never   Sexual activity: Not on file  Other Topics Concern   Not on file  Social History Narrative   ** Merged History Encounter **       Social Determinants of Health   Financial Resource Strain: Not on file  Food Insecurity: Not on file  Transportation Needs: Not on file  Physical Activity: Not on file  Stress: Not on file  Social Connections: Not on file  Intimate Partner Violence: Not on file     Allergies  Allergen Reactions   Azithromycin Other (See Comments)    Pain in arm with IV infusion    Bactrim [Sulfamethoxazole-Trimethoprim] Rash    Condrolite [Glucosamine-Chondroitin-Msm] Rash     @encmedstart @  Vitals:   04/24/21 1341  BP: 131/68  Pulse: 96  Resp: (!) 25  Temp: 97.7 F (36.5 C)  TempSrc: Other (Comment)  SpO2: 96%  Weight: 82.3 kg  Height: 5\' 2"  (1.575 m)   General:  Resting comfortably in bed HENT: NCAT OP clear PULM: Diminished left base, clear on right, normal effort CV: RRR, no mgr GI: BS+, soft, nontender MSK: normal bulk and tone Neuro: awake, alert, no distress, MAEW  CBC    Component Value Date/Time   WBC 12.0 (H) 01/30/2021 0222   RBC 4.52 01/30/2021 0222   HGB 13.2 01/30/2021 0222   HGB 15.7 12/27/2018 0831   HCT 39.7 01/30/2021 0222   HCT 47.4 (H) 12/27/2018 0831   PLT 347 01/30/2021 0222   PLT 213 12/27/2018 0831   MCV 87.8 01/30/2021 0222   MCV 90 12/27/2018 0831   MCH 29.2 01/30/2021 0222   MCHC 33.2 01/30/2021 0222   RDW 14.8 01/30/2021 0222   RDW 13.1 12/27/2018 0831   LYMPHSABS 1.2 01/19/2021 1803   MONOABS 1.0 01/19/2021 1803   EOSABS 0.0 01/19/2021 1803   BASOSABS 0.0 01/19/2021 1803   CXR: moderate  to large left pleural effusion  Impression: Left pleural effusions  Plan: Ultrasound guided left thoracentesis with routine fluid analysis including infectious and malignancy testing  Roselie Awkward, MD [facility] Pager: [phone] Cell: [phone] After 7:00 pm call Elink  [phone]

## 2021-04-24 NOTE — Progress Notes (Signed)
600 cc pink straw colored fluid removed from left lung

## 2021-04-24 NOTE — Procedures (Signed)
Thoracentesis  Procedure Note  Rebecca Short  [id number]  06/05/1940  Date:04/24/21  Time:2:44 PM   Provider Performing:Namine Beahm Judie Petit Pecola Leisure   Procedure: Thoracentesis with imaging guidance (88325)  Indication(s) Pleural Effusion  Consent Risks of the procedure as well as the alternatives and risks of each were explained to the patient and/or caregiver.  Consent for the procedure was obtained and is signed in the bedside chart  Anesthesia Topical only with 1% lidocaine   Time Out Verified patient identification, verified procedure, site/side was marked, verified correct patient position, special equipment/implants available, medications/allergies/relevant history reviewed, required imaging and test results available.  Sterile Technique Maximal sterile technique including full sterile barrier drape, hand hygiene, sterile gown, sterile gloves, mask, hair covering, sterile ultrasound probe cover (if used).  Procedure Description Ultrasound was used to identify appropriate pleural anatomy for placement and overlying skin marked.  Area of drainage cleaned and draped in sterile fashion. Lidocaine was used to anesthetize the skin and subcutaneous tissue.  600cc of amber-colored fluid was drained from the left pleural space. Catheter then removed and bandaid applied to site.  Complications/Tolerance None; patient tolerated the procedure well. Chest X-ray is ordered to confirm no post-procedural complication.  EBL Minimal  Specimen(s) Pleural fluid  The entire procedure was supervised/proctored by Dr. Heber Sublimity.  Tim Lair, PA-C  Pulmonary & Critical Care 04/24/21 2:45 PM  Please see [email or web address] for pager details.

## 2021-04-25 ENCOUNTER — Telehealth: Payer: Self-pay | Admitting: Pulmonary Disease

## 2021-04-25 DIAGNOSIS — J90 Pleural effusion, not elsewhere classified: Secondary | ICD-10-CM

## 2021-04-25 LAB — MISC LABCORP TEST (SEND OUT): Labcorp test code: 5367

## 2021-04-25 LAB — MPO/PR-3 (ANCA) ANTIBODIES
Myeloperoxidase Abs: <1 AI
Serine Protease 3: <1 AI

## 2021-04-26 ENCOUNTER — Encounter (HOSPITAL_COMMUNITY): Payer: Self-pay | Admitting: Pulmonary Disease

## 2021-04-26 ENCOUNTER — Other Ambulatory Visit: Payer: Self-pay | Admitting: Pulmonary Disease

## 2021-04-26 DIAGNOSIS — J90 Pleural effusion, not elsewhere classified: Secondary | ICD-10-CM

## 2021-04-26 LAB — CYTOLOGY - NON PAP

## 2021-04-26 NOTE — Telephone Encounter (Signed)
Yes, please order CT chest with contrast for left pleural effusion.  Thanks, Cletis [place]

## 2021-04-26 NOTE — Telephone Encounter (Signed)
Call made to patient, confirmed DOB. Made aware of JD recommendations. Order placed. Aware order has been placed STAT.   Nothing further needed at this time.

## 2021-04-26 NOTE — Telephone Encounter (Signed)
Call made to patient, confirmed DOB. Patient is requesting a Chest CT stating a lot changed as far as her breathing and fluid build up. She states she discussed this with JD previously and would like to go ahead and move forward with the CT.   JD please advise if you would like order placed for Chest CT for patient. Thanks :)

## 2021-04-27 LAB — BODY FLUID CULTURE W GRAM STAIN
Culture: NO GROWTH
Gram Stain: NONE SEEN

## 2021-04-29 ENCOUNTER — Other Ambulatory Visit: Payer: Self-pay

## 2021-04-29 ENCOUNTER — Ambulatory Visit
Admission: RE | Admit: 2021-04-29 | Discharge: 2021-04-29 | Disposition: A | Discharge status: Home / Self Care | Payer: Medicare Other | Source: Ambulatory Visit | Attending: Pulmonary Disease | Admitting: Pulmonary Disease

## 2021-04-29 DIAGNOSIS — J90 Pleural effusion, not elsewhere classified: Secondary | ICD-10-CM

## 2021-04-29 MED ORDER — IOPAMIDOL (ISOVUE-300) INJECTION 61%
75.0000 mL | Freq: Once | INTRAVENOUS | Status: AC | PRN
  Administered 2021-04-29: 75 mL via INTRAVENOUS

## 2021-05-02 ENCOUNTER — Ambulatory Visit: Payer: Medicare Other | Admitting: Pulmonary Disease

## 2021-05-03 ENCOUNTER — Telehealth: Payer: Self-pay | Admitting: Pulmonary Disease

## 2021-05-03 MED ORDER — PREDNISONE 20 MG PO TABS: 40.0000 mg | ORAL_TABLET | Freq: Every day | ORAL | 0 refills | Status: DC

## 2021-05-03 NOTE — Telephone Encounter (Signed)
Refill has been sent to the pharmacy per pts request.  Nothing further is needed.  

## 2021-05-05 ENCOUNTER — Other Ambulatory Visit: Payer: Self-pay | Admitting: Pulmonary Disease

## 2021-05-05 DIAGNOSIS — J90 Pleural effusion, not elsewhere classified: Secondary | ICD-10-CM

## 2021-05-07 LAB — MISC LABCORP TEST (SEND OUT): Labcorp test code: 9985

## 2021-05-07 NOTE — Progress Notes (Signed)
Office Visit Note  Patient: Rebecca Short             Date of Birth: [birth year]           MRN: [id number]             PCP: Lawerance Cruel, MD Referring: Martyn Ehrich, NP Visit Date: 05/08/2021   Subjective:  New Patient (Initial Visit) (Abnormal labs)   History of Present Illness: Rebecca Short is a 80 y.o. female here for evaluation with recurrent pleural effusions and dyspnea with positive RF and ANCA Abs. She was hospitalized in August after progressively worsening dyspnea and hypotension with exudative pleural fluid removed from left lung space followed by pericardial drainage and initial treatment with colchicine. She subsequently underwent additional thoracentesis drainage of effusions later in August and beginning of September. Results showed neutrophil predominant fluid. Prednisone 40 mg daily was added to treatment with stable condition. This continued for a month but tapering the prednisone resulted in more rapid re accumulation of fluid on a dose of about 20 gm daily or less. Imaging demonstrated progressive increase again in left sided effusions and repeat drainage again on 11/14 and 11/30 when down to 10 mg and 5 mg daily doses of prednisone along with colchicine. She is now continuing prednisone 40 mg with atovaquone prophylaxis and colchicine 0.6 mg daily. In addition to pulmonary symptoms she reports bilateral shoulder pain and stiffness developing around the initial onset of her dyspnea before hospitalization in August.  This improved completely on 40 mg prednisone but she noticed definite recurrence of shoulder pain symptoms when tapering down the prednisone to 10 or 5 mg/day dose.  No active complaints today since she is back on higher dose medication.  She denies any peripheral joint pain involvement did not notice any significant swelling or numbness in her upper extremities associated with the pain.  Lab testing otherwise showed positive rheumatoid factor and  atypical p-ANCA Abs.  Labs reviewed RF 130 CCP neg ANA neg dsDNA, RNP, SM neg MPO/PR3 Abs neg Atypical P-ANCA 1:80 ESR 21 CRP 7.4  12/2020 Urine protein negative  Imaging reviewed  04/29/21 CT Chest W Contrast IMPRESSION: Stable small left pleural effusion. Small right pleural effusion has increased since prior study. Left lower lobe opacity, favor atelectasis. Lingular opacity could reflect atelectasis or scarring and is stable. New dependent right lower lobe atelectasis. Cardiomegaly.   Activities of Daily Living:  Patient reports morning stiffness for 0  none .   Patient Reports nocturnal pain.  Difficulty dressing/grooming: Denies Difficulty climbing stairs: Denies Difficulty getting out of chair: Denies Difficulty using hands for taps, buttons, cutlery, and/or writing: Denies  Review of Systems  Constitutional:  Positive for fatigue.  HENT:  Negative for mouth dryness.   Eyes:  Positive for dryness.  Respiratory:  Negative for shortness of breath.   Cardiovascular:  Positive for swelling in legs/feet.  Gastrointestinal:  Negative for constipation.  Endocrine: Positive for increased urination.  Genitourinary:  Negative for difficulty urinating.  Musculoskeletal:  Positive for joint pain, joint pain, joint swelling and muscle weakness.  Skin:  Positive for redness.  Allergic/Immunologic: Negative for susceptible to infections.  Neurological:  Negative for numbness.  Hematological:  Positive for bruising/bleeding tendency.  Psychiatric/Behavioral:  Negative for sleep disturbance.    PMFS History:  Patient Active Problem List   Diagnosis Date Noted   Bilateral shoulder pain 05/08/2021   Vitamin D deficiency 05/08/2021   Abnormal ANCA test 05/08/2021  Rheumatoid factor positive 04/22/2021   S/P thoracentesis    Arterial hypotension    Pleural effusion on left    Pericardial effusion    Leukocytosis 01/03/2021   Community acquired pneumonia 01/03/2021   Atrial  fibrillation ([place]) 12/24/2020   Atrial fibrillation with RVR ([place]) 12/23/2020   Musculoskeletal pain 12/23/2020   Posterior vitreous detachment of both eyes 04/30/2020   Macular pucker, right eye 11/08/2019   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[place]) 11/08/2019   Intermediate stage nonexudative age-related macular degeneration of both eyes 11/08/2019   Anticoagulated 11/18/2016   Bradycardia 03/23/2014   OSA (obstructive sleep apnea) 01/19/2014   Obesity (BMI 30-39.9) 01/19/2014   Paroxysmal A-fib ([place]) 10/04/2013   Nonspecific abnormal unspecified cardiovascular function study 07/21/2013   Acute gastroenteritis 07/15/2013    Past Medical History:  Diagnosis Date   A-fib [facility])    Allergy    Atrial fibrillation (HCC)    FH: cholecystectomy 1997   Macular degeneration    OSA (obstructive sleep apnea)    mild with total AHI 7.25/hr and severe during REM sleep at 35/hr now on CPAP at 6cm H2O    Family History  Problem Relation Age of Onset   Osteoporosis Mother    Macular degeneration Mother    Heart Problems Father    Stroke Father    Diabetes Brother    Kidney failure Brother    Cancer Maternal Grandmother    Dementia Paternal Grandmother    Suicidality Other    Heart attack Neg Hx    Past Surgical History:  Procedure Laterality Date   CHOLECYSTECTOMY     PERICARDIOCENTESIS N/A 01/07/2021   Procedure: PERICARDIOCENTESIS;  Surgeon: Nelva Bush, MD;  Location: [facility];  Service: Cardiovascular;  Laterality: N/A;   REPLACEMENT TOTAL KNEE BILATERAL     THORACENTESIS N/A 01/25/2021   Procedure: THORACENTESIS;  Surgeon: Freddi Starr, MD;  Location: [facility] ENDOSCOPY;  Service: Pulmonary;  Laterality: N/A;   THORACENTESIS N/A 04/08/2021   Procedure: THORACENTESIS;  Surgeon: Freddi Starr, MD;  Location: [facility] ENDOSCOPY;  Service: Pulmonary;  Laterality: N/A;   THORACENTESIS N/A 04/24/2021   Procedure:  THORACENTESIS;  Surgeon: Juanito Doom, MD;  Location: [place];  Service: Cardiopulmonary;  Laterality: N/A;   Social History   Social History Narrative   ** Merged History Encounter **       Immunization History  Administered Date(s) Administered   Influenza-Unspecified 01/24/2014   PFIZER(Purple Top)SARS-COV-2 Vaccination 06/16/2019, 07/07/2019     Objective: Vital Signs: BP 114/77 (BP Location: Right Arm, Patient Position: Sitting, Cuff Size: Normal)    Pulse 93    Resp 16    Ht $R'5\' 2"'Iw$  (1.575 m)    Wt 176 lb (79.8 kg)    BMI 32.19 kg/m    Physical Exam Constitutional:      Appearance: She is obese.  HENT:     Right Ear: External ear normal.     Left Ear: External ear normal.     Mouth/Throat:     Mouth: Mucous membranes are moist.     Pharynx: Oropharynx is clear.  Eyes:     Conjunctiva/sclera: Conjunctivae normal.  Cardiovascular:     Rate and Rhythm: Normal rate and regular rhythm.  Pulmonary:     Effort: Pulmonary effort is normal.     Comments: Breath sounds decreased in left lower lung field Musculoskeletal:     Right lower leg: Edema present.  Left lower leg: Edema present.  Lymphadenopathy:     Cervical: No cervical adenopathy.  Skin:    General: Skin is warm and dry.     Comments: Scattered ecchymosis on both hands Petechiae and irregular bordered purpuric rash on dorsal right forearm  Neurological:     Mental Status: She is alert.  Psychiatric:        Mood and Affect: Mood normal.     Musculoskeletal Exam:  Neck full ROM no tenderness Shoulders full ROM no tenderness or swelling Elbows full ROM no tenderness or swelling Wrists full ROM no tenderness or swelling Fingers full ROM no tenderness or swelling Knees full ROM no tenderness or swelling Ankles full ROM no tenderness or swelling    Investigation: No additional findings.  Imaging: DG Chest 2 View  Result Date: 04/22/2021 CLINICAL DATA:  An 80 year old female presents for  evaluation of pleural effusion with increased shortness of breath. EXAM: CHEST - 2 VIEW COMPARISON:  April 08, 2021. FINDINGS: Increased density in the LEFT chest when compared to the previous chest x-ray. LEFT heart border is obscured as is the LEFT hemidiaphragm. Meniscus extends to approximately the level of the LEFT seventh rib on the frontal view. Mild increased interstitial markings throughout the chest. Blunting of RIGHT costodiaphragmatic sulcus with linear airspace disease at the RIGHT lung base. On limited assessment there is no acute skeletal process. IMPRESSION: Increasing LEFT effusion and associated airspace disease. Correlate with any signs of infection. Given persistence/worsening underlying pulmonary or bronchial lesion would be difficult to exclude. Correlate with results of prior thoracentesis and consider follow-up chest CT for further evaluation as warranted. Trace RIGHT effusion with basilar atelectasis. Electronically Signed   By: Zetta Bills M.D.   On: 04/22/2021 15:52   CT CHEST W CONTRAST  Result Date: 04/29/2021 CLINICAL DATA:  Abnormal x-ray, pleural effusion EXAM: CT CHEST WITH CONTRAST TECHNIQUE: Multidetector CT imaging of the chest was performed during intravenous contrast administration. CONTRAST:  30mL ISOVUE-300 IOPAMIDOL (ISOVUE-300) INJECTION 61% COMPARISON:  03/25/2021 chest CT.  Chest x-ray 04/24/2021. FINDINGS: Cardiovascular: Heart is mildly enlarged. Aorta normal caliber with scattered calcifications. Punctate calcification in the left anterior descending coronary artery. Mediastinum/Nodes: No mediastinal, hilar, or axillary adenopathy. Trachea and esophagus are unremarkable. Thyroid unremarkable. Lungs/Pleura: Small bilateral pleural effusions. This is stable on the left since prior study, increasing on the right since prior study. Probable atelectasis in the left lower lobe, stable. Lingular atelectasis or scarring is also stable. Minimal dependent atelectasis in  the right lower lobe. Upper Abdomen: Imaging into the upper abdomen demonstrates no acute findings. Prior cholecystectomy with intrahepatic and extrahepatic biliary ductal dilatation, stable. Small hypodense lesion in the pancreatic neck is again seen, unchanged. Musculoskeletal: Chest wall soft tissues are unremarkable. Degenerative changes throughout the thoracic spine. No acute bony abnormality. IMPRESSION: Stable small left pleural effusion. Small right pleural effusion has increased since prior study. Left lower lobe opacity, favor atelectasis. Lingular opacity could reflect atelectasis or scarring and is stable. New dependent right lower lobe atelectasis. Cardiomegaly. Aortic Atherosclerosis (ICD10-I70.0). Electronically Signed   By: Rolm Baptise M.D.   On: 04/29/2021 09:29   DG Chest Port 1 View  Result Date: 04/24/2021 CLINICAL DATA:  Recurrent left pleural effusion. EXAM: PORTABLE CHEST 1 VIEW COMPARISON:  Chest x-ray 04/08/2021. FINDINGS: Small left pleural effusion is present. There is blunting of the right costophrenic angle. There is some strandy opacities in both lung bases. The cardiac silhouette is enlarged, unchanged. There is no pneumothorax. No  acute fractures are seen. IMPRESSION: 1. Small left pleural effusion. 2. Minimal right pleural effusion versus scarring. 3. Bibasilar linear atelectasis/airspace disease. Electronically Signed   By: Darliss Cheney M.D.   On: 04/24/2021 15:33   XR Shoulder Left  Result Date: 05/08/2021 X-ray left shoulder 4 views Normal glenohumeral joint space.  Humeral head is in normal position with normal internal and external rotation.  Some asymmetric AC joint space narrowing with subchondral cystic changes and distal clavicle.  No visible effusions or abnormal calcifications seen. Impression AC joint osteoarthritis no significant abnormality in glenohumeral joint  XR Shoulder Right  Result Date: 05/08/2021 X-ray right shoulder 4 views Glenohumeral joint  mild narrowing without significant subchondral sclerosis or large osteophytes.  Humeral head is in slightly low position.  Mild AC joint space narrowing and cystic changes.  No visible effusions or abnormal calcifications. 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  Procedures:  No procedures performed Allergies: Azithromycin, Condrolite [glucosamine-chondroitin-msm], Glucosamine, and Sulfamethoxazole-trimethoprim   Assessment / Plan:     Visit Diagnoses: Pleural effusion on left Rheumatoid factor positive - Plan: XR Shoulder Left, XR Shoulder Right, IgG, IgA, IgM, Serum protein electrophoresis with reflex, Cryoglobulin  Recurrent pleural and pericardial effusions most frequently on the left lung highly positive rheumatoid factor test and steroid responsiveness no evidence of infectious or malignant process on evaluation so far.  Checking cryoglobulins, quantitative immunoglobulins, protein electrophoresis for additional possible underlying causes of the positive rheumatoid factor.  Pleural fluid is not definitely characteristic of a rheumatoid pleural effusion.  Does not sound like there is any suspicious mass or focal lesion to guide biopsy for high diagnostic value.  No other specific findings or recommend empiric DMARD treatment such as azathioprine started then attempting  prednisone taper again.  Chronic pain of both shoulders - Plan: XR Shoulder Left, XR Shoulder Right  Bilateral shoulder pains with pretty new onset during these ongoing lung process.  Bilateral x-rays checked in clinic showing mild degenerative changes in the right shoulder more so than the left unlikely to explain her reported symptoms.  Sounds most consistent with a bursitis or shoulder impingement no current symptoms today but can monitor for this returning with prednisone tapering in the future.  Vitamin D deficiency - Plan: VITAMIN D 25 Hydroxy (Vit-D Deficiency, Fractures), Vitamin D 1,25 dihydroxy  Suspected deficiency currently on significant prednisone anticipating ongoing treatment also screening for markers of sarcoidosis or chronic granulomatous disease peripheral activation, although negative imaging much less likely.  Abnormal ANCA test  Fairly low titer atypical P ANCA test no evidence for hepatobiliary or other gastrointestinal disease no evidence for kidney involvement at this time.  Including some additional vasculitis work-up as above.  Orders: Orders Placed This Encounter  Procedures   XR Shoulder Left  XR Shoulder Right   IgG, IgA, IgM   Serum protein electrophoresis with reflex   Cryoglobulin   VITAMIN D 25 Hydroxy (Vit-D Deficiency, Fractures)   Vitamin D 1,25 dihydroxy   Anti-smooth muscle antibody, IgG    No orders of the defined types were placed in this encounter.    Follow-Up Instructions: No follow-ups on file.   Collier Salina, MD  Note - This record has been created using [employer].  Chart creation errors have been sought, but may not always  have been located. Such creation errors do not reflect on  the standard of medical care.

## 2021-05-08 ENCOUNTER — Other Ambulatory Visit: Payer: Self-pay

## 2021-05-08 ENCOUNTER — Ambulatory Visit: Payer: Self-pay

## 2021-05-08 ENCOUNTER — Ambulatory Visit (INDEPENDENT_AMBULATORY_CARE_PROVIDER_SITE_OTHER): Payer: Medicare Other | Admitting: Internal Medicine

## 2021-05-08 ENCOUNTER — Encounter: Payer: Self-pay | Admitting: Internal Medicine

## 2021-05-08 VITALS — BP 114/77 | HR 93 | Resp 16 | Ht 62.0 in | Wt 176.0 lb

## 2021-05-08 DIAGNOSIS — J90 Pleural effusion, not elsewhere classified: Secondary | ICD-10-CM

## 2021-05-08 DIAGNOSIS — M25512 Pain in left shoulder: Secondary | ICD-10-CM

## 2021-05-08 DIAGNOSIS — G8929 Other chronic pain: Secondary | ICD-10-CM

## 2021-05-08 DIAGNOSIS — E559 Vitamin D deficiency, unspecified: Secondary | ICD-10-CM

## 2021-05-08 DIAGNOSIS — M25511 Pain in right shoulder: Secondary | ICD-10-CM | POA: Diagnosis not present

## 2021-05-08 DIAGNOSIS — R768 Other specified abnormal immunological findings in serum: Secondary | ICD-10-CM

## 2021-05-08 LAB — FUNGUS CULTURE RESULT

## 2021-05-08 LAB — FUNGUS CULTURE WITH STAIN

## 2021-05-08 LAB — FUNGAL ORGANISM REFLEX

## 2021-05-15 ENCOUNTER — Telehealth: Payer: Self-pay | Admitting: Rheumatology

## 2021-05-15 NOTE — Telephone Encounter (Signed)
Patient left a voicemail stating she checked her my chart and her "Cryoglobulin and Rheumatoid factor positive" results were not in yet. Patient states she does not understand why it is taking so long. Patient has questions and requests a return call.

## 2021-05-15 NOTE — Telephone Encounter (Signed)
Patient advised that the 2 test that have not resulted (Cryoglobulin and IFE interpretation are sent out to another lab. Patient advised the blood work was probably received on Friday or Monday  and the test is only ran Monday-Friday. Patient advised there may also be a delay with the holidays. Patient advised Dr. Dimple Casey will not be in the office to review results next week. Patient expressed understanding.

## 2021-05-17 LAB — VITAMIN D 1,25 DIHYDROXY
Vitamin D 1, 25 (OH)2 Total: 26 pg/mL (ref 18–72)
Vitamin D2 1, 25 (OH)2: <8 pg/mL
Vitamin D3 1, 25 (OH)2: 26 pg/mL

## 2021-05-17 LAB — PROTEIN ELECTROPHORESIS, SERUM, WITH REFLEX
Albumin ELP: 3.8 g/dL (ref 3.8–4.8)
Alpha 1: 0.3 g/dL (ref 0.2–0.3)
Alpha 2: 0.6 g/dL (ref 0.5–0.9)
Beta 2: 0.2 g/dL (ref 0.2–0.5)
Beta Globulin: 0.5 g/dL (ref 0.4–0.6)
Gamma Globulin: 0.8 g/dL (ref 0.8–1.7)
Total Protein: 6.2 g/dL (ref 6.1–8.1)

## 2021-05-17 LAB — IGG, IGA, IGM
IgG (Immunoglobin G), Serum: 791 mg/dL (ref 600–1540)
IgM, Serum: 214 mg/dL (ref 50–300)
Immunoglobulin A: 174 mg/dL (ref 70–320)

## 2021-05-17 LAB — VITAMIN D 25 HYDROXY (VIT D DEFICIENCY, FRACTURES): Vit D, 25-Hydroxy: 63 ng/mL (ref 30–100)

## 2021-05-17 LAB — ANTI-SMOOTH MUSCLE ANTIBODY, IGG: Actin (Smooth Muscle) Antibody (IGG): 40 U — ABNORMAL HIGH (ref <20)

## 2021-05-17 LAB — CRYOGLOBULIN: Cryoglobulin, Qualitative Analysis: NOT DETECTED

## 2021-05-17 LAB — IFE INTERPRETATION

## 2021-05-22 ENCOUNTER — Other Ambulatory Visit: Payer: Self-pay

## 2021-05-22 ENCOUNTER — Ambulatory Visit (INDEPENDENT_AMBULATORY_CARE_PROVIDER_SITE_OTHER): Payer: Medicare Other

## 2021-05-22 ENCOUNTER — Ambulatory Visit (INDEPENDENT_AMBULATORY_CARE_PROVIDER_SITE_OTHER): Payer: Medicare Other | Admitting: Pulmonary Disease

## 2021-05-22 ENCOUNTER — Encounter: Payer: Self-pay | Admitting: Pulmonary Disease

## 2021-05-22 VITALS — BP 118/72 | HR 84 | Ht 62.0 in | Wt 178.0 lb

## 2021-05-22 DIAGNOSIS — R768 Other specified abnormal immunological findings in serum: Secondary | ICD-10-CM | POA: Diagnosis not present

## 2021-05-22 DIAGNOSIS — J90 Pleural effusion, not elsewhere classified: Secondary | ICD-10-CM | POA: Diagnosis not present

## 2021-05-22 NOTE — Progress Notes (Signed)
Synopsis: Referred in October 2022 for hospital follow up for pleural effusions  Subjective:   PATIENT ID: Rebecca Short GENDER: female DOB: [birth year], MRN: [id number]  HPI  Chief Complaint  Patient presents with   Follow-up    1 mo f/u for pleural effusion.    Rebecca Short is an 80 year old woman, never smoker with history of obstructive sleep apnea and atrial fibrillation who returns to pulmonary clinic for follow up of serositis.   She was evaluated by Dr. 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 Anti-smooth muscle anitbody is elevated. Cryoglobulins are negative.   Discussed pleural fluid studies further with Dr. Kenard Gower which showed mixed granulocytes and histioctyes along with some multinucleated giant cells. A definite diagnosis can't be placed on these findings, but he did raise concern for rheumatoid arthritis.   These upates were shared with the patient, her husband and friend.   Chest x-ray today shows persistent left pleural effusion and clear right lung.   Patient is feeling much better after increasing her prednisone back to 40mg  daily. She remains on prophylactic antibiotics. Her shoulder pain has also resolved with the prednisone.   She continues to work with PT each week and feels her conditioning in improving.  OV 04/24/21 She was seen in acute visit on 11/28 by 12/28, NP for increasing shortness of breath and pleuritic chest pain. She had tapered down to 5mg  of prednisone at this time. She had repeat thoracentesis on 04/08/21 based on CT chest from 04/22/21 which continued to show persistent left pleural effusion. Post-thoracentesis x-ray showed left pleural effusion and small right pleural effusion. Chest x-ray on 11/28 shows increased left pleural effusion.   Patient complains of increasing shortness of breath, bilateral shoulder pain and increasing left-sided discomfort.  Labs from visit on 11/16 showed  rheumatoid factor I30 previously 57.2 on 01/21/2021.  P ANCA titer was 1:80 on 04/10/2021.  The following labs were negative on 04/10/2021: ANA, anti-CCP, dsDNA antibody and previously negative for SSA and SSB and antihistone antibodies.  OV 03/06/21 She was admitted 8/27 to 9/7 for pericardial effusion and bilateral pleural effusions. She is status post pericardial drain and multiple thoracenteses. The pleural fluid studies were consistent with exudative process which were initially neutrophil predominant that later became lymphocyte predominant. Cytology was negative for malignancy but showed reactive mesothelial cells.  She is being treated for serositis with prednisone taper and colchicine for the pericardial effusion. She has been on 40mg  of prednisone over the past month. She has been on pneumocystis prophylaxis with atovaquone, she developed skin rash with bactrim. Her inflammatory workup was rather unrevealing as ANA, CCP anti-histone ab and SSA/SSB were negative. Her rheumatoid factor was elevated at 57.2.   She is feeling significantly better since discharge is resuming her normal activity and is quite satisfied with her recovering thus far.   Chest radiograph today shows opacities at the left lung base. The right lung is significantly improved since prior study with resolution of right pleural effusion.  Past Medical History:  Diagnosis Date   A-fib [facility])    Allergy    Atrial fibrillation (HCC)    FH: cholecystectomy 1997   Macular degeneration    OSA (obstructive sleep apnea)    mild with total AHI 7.25/hr and severe during REM sleep at 35/hr now on CPAP at 6cm H2O     Family History  Problem Relation Age of Onset   Osteoporosis Mother  Macular degeneration Mother    Heart Problems Father    Stroke Father    Diabetes Brother    Kidney failure Brother    Cancer Maternal Grandmother    Dementia Paternal Grandmother    Suicidality Other    Heart attack Neg Hx       Social History   Socioeconomic History   Marital status: Married    Spouse name: Not on file   Number of children: Not on file   Years of education: Not on file   Highest education level: Not on file  Occupational History   Not on file  Tobacco Use   Smoking status: Never   Smokeless tobacco: Never  Vaping Use   Vaping Use: Never used  Substance and Sexual Activity   Alcohol use: Never   Drug use: Never   Sexual activity: Not on file  Other Topics Concern   Not on file  Social History Narrative   ** Merged History Encounter **       Social Determinants of Health   Financial Resource Strain: Not on file  Food Insecurity: Not on file  Transportation Needs: Not on file  Physical Activity: Not on file  Stress: Not on file  Social Connections: Not on file  Intimate Partner Violence: Not on file     Allergies  Allergen Reactions   Azithromycin Other (See Comments)    Pain in arm with IV infusion    Condrolite [Glucosamine-Chondroitin-Msm] Rash   Glucosamine Rash    Other reaction(s): rash   Sulfamethoxazole-Trimethoprim Rash    Other reaction(s): rash     Outpatient Medications Prior to Visit  Medication Sig Dispense Refill   acetaminophen (TYLENOL) 325 MG tablet Take 2 tablets (650 mg total) by mouth every 6 (six) hours as needed for moderate pain or mild pain.     atovaquone (MEPRON) 750 MG/5ML suspension TAKE 5 MLS (750 MG TOTAL) BY MOUTH 2 (TWO) TIMES DAILY WITH A MEAL. 210 mL 0   CALCIUM CITRATE-VITAMIN D PO Take 1 tablet by mouth at bedtime.     carboxymethylcellulose (REFRESH PLUS) 0.5 % SOLN Place 1 drop into both eyes 2 (two) times daily.     ELIQUIS 5 MG TABS tablet TAKE 1 TABLET BY MOUTH TWICE A DAY (Patient taking differently: Take 5 mg by mouth 1 day or 1 dose.) 60 tablet 6   EPIPEN 2-PAK 0.3 MG/0.3ML SOAJ injection Inject 0.3 mg into the muscle once as needed for anaphylaxis (severe allergic reaction).  0   furosemide (LASIX) 20 MG tablet Take 1  tablet (20 mg total) by mouth daily. 90 tablet 1   metoprolol tartrate (LOPRESSOR) 25 MG tablet Take 0.5 tablets (12.5 mg total) by mouth daily. May increase to twice daily as tolerated. 30 tablet 1   Multiple Vitamin (MULTIVITAMIN WITH MINERALS) TABS tablet Take 1 tablet by mouth every morning.     Multiple Vitamins-Minerals (PRESERVISION AREDS PO) Take 1 capsule by mouth 2 (two) times daily.     pantoprazole (PROTONIX) 40 MG tablet Take 1 tablet (40 mg total) by mouth daily at 12 noon. (Patient taking differently: Take 40 mg by mouth daily.) 90 tablet 2   potassium chloride (KLOR-CON) 20 MEQ packet Take 20 mEq by mouth daily. 90 each 2   predniSONE (DELTASONE) 20 MG tablet Take 2 tablets (40 mg total) by mouth daily with breakfast. 60 tablet 0   PRESCRIPTION MEDICATION every 14 (fourteen) days. Allergy shots administered by Dr. Donneta Romberg  colchicine 0.6 MG tablet TAKE 1 TABLET BY MOUTH EVERY DAY (Patient not taking: Reported on 05/08/2021) 30 tablet 1   No facility-administered medications prior to visit.    Review of Systems  Constitutional:  Negative for chills, fever, malaise/fatigue and weight loss.  HENT:  Negative for congestion, sinus pain and sore throat.   Eyes: Negative.   Respiratory:  Positive for shortness of breath. Negative for cough, hemoptysis, sputum production and wheezing.   Cardiovascular:  Negative for chest pain, palpitations, orthopnea, claudication and leg swelling.  Gastrointestinal:  Negative for abdominal pain, heartburn, nausea and vomiting.  Genitourinary: Negative.   Musculoskeletal:  Negative for joint pain and myalgias.  Skin:  Negative for rash.  Neurological:  Negative for weakness.  Endo/Heme/Allergies: Negative.   Psychiatric/Behavioral: Negative.     Objective:   Vitals:   05/22/21 0935  BP: 118/72  Pulse: 84  SpO2: 99%  Weight: 178 lb (80.7 kg)  Height: 5\' 2"  (1.575 m)    Physical Exam Constitutional:      General: She is not in acute  distress.    Appearance: She is not ill-appearing.  HENT:     Head: Normocephalic and atraumatic.  Eyes:     General: No scleral icterus.    Conjunctiva/sclera: Conjunctivae normal.  Cardiovascular:     Rate and Rhythm: Normal rate and regular rhythm.     Pulses: Normal pulses.     Heart sounds: Normal heart sounds. No murmur heard. Pulmonary:     Effort: Pulmonary effort is normal.     Breath sounds: Examination of the left-middle field reveals decreased breath sounds. Examination of the left-lower field reveals decreased breath sounds. Decreased breath sounds present. No wheezing, rhonchi or rales.  Musculoskeletal:     Right lower leg: No edema.     Left lower leg: No edema.  Skin:    General: Skin is warm and dry.  Neurological:     General: No focal deficit present.     Mental Status: She is alert.  Psychiatric:        Mood and Affect: Mood normal.        Behavior: Behavior normal.        Thought Content: Thought content normal.        Judgment: Judgment normal.   CBC    Component Value Date/Time   WBC 12.0 (H) 01/30/2021 0222   RBC 4.52 01/30/2021 0222   HGB 13.2 01/30/2021 0222   HGB 15.7 12/27/2018 0831   HCT 39.7 01/30/2021 0222   HCT 47.4 (H) 12/27/2018 0831   PLT 347 01/30/2021 0222   PLT 213 12/27/2018 0831   MCV 87.8 01/30/2021 0222   MCV 90 12/27/2018 0831   MCH 29.2 01/30/2021 0222   MCHC 33.2 01/30/2021 0222   RDW 14.8 01/30/2021 0222   RDW 13.1 12/27/2018 0831   LYMPHSABS 1.2 01/19/2021 1803   MONOABS 1.0 01/19/2021 1803   EOSABS 0.0 01/19/2021 1803   BASOSABS 0.0 01/19/2021 1803   BMP Latest Ref Rng & Units 02/06/2021 01/30/2021 01/29/2021  Glucose 65 - 99 mg/dL 178(H) 124(H) 127(H)  BUN 8 - 27 mg/dL 27 26(H) 27(H)  Creatinine 0.57 - 1.00 mg/dL 0.84 0.86 0.80  BUN/Creat Ratio 12 - 28 32(H) - -  Sodium 134 - 144 mmol/L 139 135 136  Potassium 3.5 - 5.2 mmol/L 4.6 4.1 4.1  Chloride 96 - 106 mmol/L 98 101 106  CO2 20 - 29 mmol/L 25 28 18(L)  Calcium  8.7 -  10.3 mg/dL 8.8 8.5(L) 8.4(L)   Chest imaging: CXR 04/22/21 Increasing LEFT effusion and associated airspace disease. Correlate with any signs of infection. Given persistence/worsening underlying pulmonary or bronchial lesion would be difficult to exclude. Correlate with results of prior thoracentesis and consider follow-up chest CT for further evaluation as warranted.   Trace RIGHT effusion with basilar atelectasis.  CXR 03/06/21 Resolved right-sided pleural effusion with persistent opacity on the left, likely a combination of persisting pleural fluid and associated atelectasis/consolidation  CXR 01/30/21 1. Slight interval decrease in size of the right pleural effusion with improved aeration of the right base. 2. No significant interval change of the larger left pleural effusion and adjacent airspace disease.  CT Chest 01/03/21 Moderate left and small right pleural effusions, which is increased on the left and new on the right in comparison to prior CT. Adjacent bibasilar and lingular atelectasis.   Increased, moderate-sized pericardial effusion. Biatrial enlargement.   Mild pulmonary edema.  PFT: No flowsheet data found.  Labs: 01/21/21: Quantiferon gold indeterminate  Path:  Echo 01/20/21:  1. Left ventricular ejection fraction, by estimation, is 60 to 65%. The  left ventricle has normal function. The left ventricle has no regional  wall motion abnormalities.   2. A small pericardial effusion is present. There is no evidence of  cardiac tamponade.  Heart Catheterization:   Assessment & Plan:   Pleural effusion on left  Rheumatoid factor positive  Discussion: Rebecca Short is an 80 year old woman, never smoker with history of obstructive sleep apnea and atrial fibrillation who returns to pulmonary clinic for follow up of serositis  She has had significant clinical improvement and resolution of her dyspnea with high dose steroids. Once tapered to 20mg  daily it  appears she started to generate increasing pleural effusions and her issues with shoulder pain presented. Further tapering of steroids resulted in increasing left effusion with return of dyspnea.   She was increased back to 40mg  of prednisone daily since 11/30 with resolution of her symptoms. She is to continue on this regimen for now.  I am concerned the left pleural space will develop or has developed trapped lung physiology given the on going inflammation of that space. Discussed option of referral to Dr. Kipp Brood of thoracic surgery for possible pleural evaluation/biopsy. They wish to hold off on this referral at this time.  Will discuss case further with Dr. Benjamine Mola of Rheumatology after the holidays, given the elevated anti-smooth muscle ab and input from pathology regarding the multinucleated giant cells in the pleural fluid concerning for rheumatoid arthritis along with the elevated RF and mildly elevated P-ANCA. Patient will need a plan to transition off prednisone.  Will renew her home physical therapy order if needed.  Follow-up in 4 weeks.  Freda Jackson, MD [facility] Office: [phone]   Current Outpatient Medications:    acetaminophen (TYLENOL) 325 MG tablet, Take 2 tablets (650 mg total) by mouth every 6 (six) hours as needed for moderate pain or mild pain., Disp: , Rfl:    atovaquone (MEPRON) 750 MG/5ML suspension, TAKE 5 MLS (750 MG TOTAL) BY MOUTH 2 (TWO) TIMES DAILY WITH A MEAL., Disp: 210 mL, Rfl: 0   CALCIUM CITRATE-VITAMIN D PO, Take 1 tablet by mouth at bedtime., Disp: , Rfl:    carboxymethylcellulose (REFRESH PLUS) 0.5 % SOLN, Place 1 drop into both eyes 2 (two) times daily., Disp: , Rfl:    ELIQUIS 5 MG TABS tablet, TAKE 1 TABLET BY MOUTH TWICE A  DAY (Patient taking differently: Take 5 mg by mouth 1 day or 1 dose.), Disp: 60 tablet, Rfl: 6   EPIPEN 2-PAK 0.3 MG/0.3ML SOAJ injection, Inject 0.3 mg into the muscle once as needed for anaphylaxis  (severe allergic reaction)., Disp: , Rfl: 0   furosemide (LASIX) 20 MG tablet, Take 1 tablet (20 mg total) by mouth daily., Disp: 90 tablet, Rfl: 1   metoprolol tartrate (LOPRESSOR) 25 MG tablet, Take 0.5 tablets (12.5 mg total) by mouth daily. May increase to twice daily as tolerated., Disp: 30 tablet, Rfl: 1   Multiple Vitamin (MULTIVITAMIN WITH MINERALS) TABS tablet, Take 1 tablet by mouth every morning., Disp: , Rfl:    Multiple Vitamins-Minerals (PRESERVISION AREDS PO), Take 1 capsule by mouth 2 (two) times daily., Disp: , Rfl:    pantoprazole (PROTONIX) 40 MG tablet, Take 1 tablet (40 mg total) by mouth daily at 12 noon. (Patient taking differently: Take 40 mg by mouth daily.), Disp: 90 tablet, Rfl: 2   potassium chloride (KLOR-CON) 20 MEQ packet, Take 20 mEq by mouth daily., Disp: 90 each, Rfl: 2   predniSONE (DELTASONE) 20 MG tablet, Take 2 tablets (40 mg total) by mouth daily with breakfast., Disp: 60 tablet, Rfl: 0   PRESCRIPTION MEDICATION, every 14 (fourteen) days. Allergy shots administered by Dr. Donneta Romberg, Disp: , Rfl:

## 2021-05-22 NOTE — Patient Instructions (Addendum)
Continue current prednisone dose and atovaquone prophylaxis.   We will follow up once we hear from Dr. Dimple Casey.   Happy New Year!

## 2021-05-28 NOTE — Progress Notes (Signed)
Labs look okay. No specific evidence for vasculitis problems. 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 I think we should try to follow up to go over plan and discuss starting azathioprine and we need a baseline lab for that.

## 2021-05-29 NOTE — Progress Notes (Signed)
Office Visit Note  Patient: Rebecca Short             Date of Birth: 1940/09/01           MRN: [id number]             PCP: Lawerance Cruel, MD Referring: Lawerance Cruel, MD Visit Date: 05/30/2021   Subjective:  Results (No change)   History of Present Illness: Rebecca Short is a 81 y.o. female here for follow up for recurrent rheumatoid pleural effusions currently on prednisone 40 mg daily. No significant events or changes since our last visit.  Labs checked at initial workup including SPEP without monoclonal gammopathy, normal vitamin D and 1,25 D levels, negative cryoglobulins and normal immunoglobulin titers. Anti smooth muscle antibodies checked due to atypical ANCA titers was positive at a low level of 40.  Previous HPI 05/08/21 Rebecca Short is a 81 y.o. female here for evaluation with recurrent pleural effusions and dyspnea with positive RF and ANCA Abs. She was hospitalized in August after progressively worsening dyspnea and hypotension with exudative pleural fluid removed from left lung space followed by pericardial drainage and initial treatment with colchicine. She subsequently underwent additional thoracentesis drainage of effusions later in August and beginning of September. Results showed neutrophil predominant fluid. Prednisone 40 mg daily was added to treatment with stable condition. This continued for a month but tapering the prednisone resulted in more rapid re accumulation of fluid on a dose of about 20 gm daily or less. Imaging demonstrated progressive increase again in left sided effusions and repeat drainage again on 11/14 and 11/30 when down to 10 mg and 5 mg daily doses of prednisone along with colchicine. She is now continuing prednisone 40 mg with atovaquone prophylaxis and colchicine 0.6 mg daily. In addition to pulmonary symptoms she reports bilateral shoulder pain and stiffness developing around the initial onset of her dyspnea before hospitalization in  August.  This improved completely on 40 mg prednisone but she noticed definite recurrence of shoulder pain symptoms when tapering down the prednisone to 10 or 5 mg/day dose.  No active complaints today since she is back on higher dose medication.  She denies any peripheral joint pain involvement did not notice any significant swelling or numbness in her upper extremities associated with the pain.   Lab testing otherwise showed positive rheumatoid factor and atypical p-ANCA Abs.   Labs reviewed RF 130 Atypical P-ANCA 1:80 ESR 21 CRP 7.4  Review of Systems  Constitutional:  Positive for fatigue.  HENT:  Negative for mouth dryness.   Eyes:  Positive for dryness.  Respiratory:  Positive for shortness of breath.   Cardiovascular:  Positive for swelling in legs/feet.  Gastrointestinal:  Negative for constipation.  Endocrine: Negative for increased urination.  Genitourinary:  Negative for difficulty urinating.  Musculoskeletal:  Positive for gait problem and muscle weakness.  Skin:  Negative for rash.  Allergic/Immunologic: Negative for susceptible to infections.  Neurological:  Positive for weakness.  Hematological:  Positive for bruising/bleeding tendency.  Psychiatric/Behavioral:  Negative for sleep disturbance.    PMFS History:  Patient Active Problem List   Diagnosis Date Noted   High risk medication use 05/30/2021   Bilateral shoulder pain 05/08/2021   Vitamin D deficiency 05/08/2021   Abnormal ANCA test 05/08/2021   Rheumatoid arthritis ([place]) 04/22/2021   S/P thoracentesis    Arterial hypotension    Pleural effusion on left    Pericardial effusion    Leukocytosis  01/03/2021   Community acquired pneumonia 01/03/2021   Atrial fibrillation ([place]) 12/24/2020   Atrial fibrillation with RVR ([place]) 12/23/2020   Musculoskeletal pain 12/23/2020   Posterior vitreous detachment of both eyes 04/30/2020   Macular pucker, right eye 11/08/2019   Exudative age-related macular degeneration  of left eye with active choroidal neovascularization ([place]) 11/08/2019   Intermediate stage nonexudative age-related macular degeneration of both eyes 11/08/2019   Anticoagulated 11/18/2016   Bradycardia 03/23/2014   OSA (obstructive sleep apnea) 01/19/2014   Obesity (BMI 30-39.9) 01/19/2014   Paroxysmal A-fib ([place]) 10/04/2013   Nonspecific abnormal unspecified cardiovascular function study 07/21/2013   Acute gastroenteritis 07/15/2013    Past Medical History:  Diagnosis Date   A-fib [facility])    Allergy    Atrial fibrillation (HCC)    FH: cholecystectomy 1997   Macular degeneration    OSA (obstructive sleep apnea)    mild with total AHI 7.25/hr and severe during REM sleep at 35/hr now on CPAP at 6cm H2O    Family History  Problem Relation Age of Onset   Osteoporosis Mother    Macular degeneration Mother    Heart Problems Father    Stroke Father    Diabetes Brother    Kidney failure Brother    Cancer Maternal Grandmother    Dementia Paternal Grandmother    Suicidality Other    Heart attack Neg Hx    Past Surgical History:  Procedure Laterality Date   CHOLECYSTECTOMY     PERICARDIOCENTESIS N/A 01/07/2021   Procedure: PERICARDIOCENTESIS;  Surgeon: Nelva Bush, MD;  Location: [place] [facility];  Service: Cardiovascular;  Laterality: N/A;   REPLACEMENT TOTAL KNEE BILATERAL     THORACENTESIS N/A 01/25/2021   Procedure: THORACENTESIS;  Surgeon: Freddi Starr, MD;  Location: [facility] ENDOSCOPY;  Service: Pulmonary;  Laterality: N/A;   THORACENTESIS N/A 04/08/2021   Procedure: THORACENTESIS;  Surgeon: Freddi Starr, MD;  Location: [facility] ENDOSCOPY;  Service: Pulmonary;  Laterality: N/A;   THORACENTESIS N/A 04/24/2021   Procedure: THORACENTESIS;  Surgeon: Juanito Doom, MD;  Location: [place];  Service: Cardiopulmonary;  Laterality: N/A;   Social History   Social History Narrative   ** Merged History Encounter **       Immunization History  Administered Date(s)  Administered   Influenza-Unspecified 01/24/2014   PFIZER(Purple Top)SARS-COV-2 Vaccination 06/16/2019, 07/07/2019     Objective: Vital Signs: BP 120/64 (BP Location: Right Arm, Patient Position: Sitting, Cuff Size: Normal)    Pulse (!) 106    Resp 17    Ht _0  (1.575 m)    Wt 179 lb (81.2 kg)    BMI 32.74 kg/m    Physical Exam Eyes:     Conjunctiva/sclera: Conjunctivae normal.  Cardiovascular:     Rate and Rhythm: Tachycardia present.  Pulmonary:     Effort: Pulmonary effort is normal.  Skin:    General: Skin is warm and dry.     Findings: No rash.  Neurological:     Mental Status: She is alert.  Psychiatric:        Mood and Affect: Mood normal.       Investigation: No additional findings.  Imaging: DG Chest 2 View  Result Date: 05/22/2021 CLINICAL DATA:  Follow-up pleural effusion EXAM: CHEST - 2 VIEW COMPARISON:  Chest radiograph 04/24/2021 FINDINGS: Cardiomediastinal silhouette is grossly stable. There is a small to medium size left pleural effusion, not significantly changed since 04/24/2021. Aeration of the left lung is also not significantly changed.  The previously seen small right effusion has resolved. The right lung is now clear. There is no pneumothorax. The bones are stable. IMPRESSION: Stable left pleural effusion and resolved right pleural effusion since 04/24/2021. Electronically Signed   By: Valetta Mole M.D.   On: 05/22/2021 09:36   XR Shoulder Left  Result Date: 05/08/2021 X-ray left shoulder 4 views Normal glenohumeral joint space.  Humeral head is in normal position with normal internal and external rotation.  Some asymmetric AC joint space narrowing with subchondral cystic changes and distal clavicle.  No visible effusions or abnormal calcifications seen. Impression AC joint osteoarthritis no significant abnormality in glenohumeral joint  XR Shoulder Right  Result Date: 05/08/2021 X-ray right shoulder 4 views Glenohumeral joint mild narrowing without  significant subchondral sclerosis or large osteophytes.  Humeral head is in slightly low position.  Mild AC joint space narrowing and cystic changes.  No visible effusions or abnormal calcifications. 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  Procedures:  No procedures performed Allergies: Azithromycin, Erythromycin, Condrolite [glucosamine-chondroitin-msm], Glucosamine, and Sulfamethoxazole-trimethoprim   Assessment / Plan:     Visit Diagnoses: Rheumatoid arthritis involving multiple sites with positive rheumatoid factor ([place]) - Plan: azaTHIOprine (IMURAN) 50 MG tablet  Clinical picture remains somewhat unclear pleural fluid analysis and antibody tests may be consistent with a rheumatoid arthritis with primarily extra-articular involvement though cannot confirm the joint disease.  She has also been on 40 mg of prednisone at all of our visits which may be masking any inflammatory joint disease.  Other abnormal results with atypical P ANCA and low positive anti-smooth muscle antibody titers.  Currently no other findings suggestive for systemic small vessel vasculitis no abnormalities suggesting autoimmune hepatitis on labs or imaging.  We will plan to start steroid sparing DMARD treatment initially azathioprine 50 mg p.o. daily follow-up in 1 month for repeat labs and monitoring we will  plan to start steroid tapering by that time if continuing to do well and can titrate azathioprine as needed.  High risk medication use - Plan: Thiopurine methyltransferase(tpmt)rbc  We discussed risks and side effects of treatment with azathioprine including infections, cytopenias, liver function abnormalities, or hypersensitivity reaction is rare.  Baseline lab results are currently normal.  We will check TPMT enzyme function test if this is low would not titrate to higher dose medication.  She had multiple questions about side effects and drug interactions I do not believe there is any direct effect on Eliquis anticoagulation.  Recommended against getting seasonal flu vaccine or COVID booster vaccines at this time due to current high-dose prednisone treatment.  She is continuing the atovaquone antibiotic prophylaxis.  Orders: Orders Placed This Encounter  Procedures   Thiopurine methyltransferase(tpmt)rbc   Meds ordered this encounter  Medications   azaTHIOprine (IMURAN) 50 MG  tablet    Sig: Take 1 tablet (50 mg total) by mouth daily.    Dispense:  30 tablet    Refill:  1     Follow-Up Instructions: Return in about 4 weeks (around 06/27/2021) for RA AZA start f/u 4wks.   Collier Salina, MD  Note - This record has been created using [employer].  Chart creation errors have been sought, but may not always  have been located. Such creation errors do not reflect on  the standard of medical care.

## 2021-05-30 ENCOUNTER — Encounter: Payer: Self-pay | Admitting: Internal Medicine

## 2021-05-30 ENCOUNTER — Telehealth: Payer: Self-pay

## 2021-05-30 ENCOUNTER — Telehealth: Payer: Self-pay | Admitting: Pulmonary Disease

## 2021-05-30 ENCOUNTER — Ambulatory Visit (INDEPENDENT_AMBULATORY_CARE_PROVIDER_SITE_OTHER): Payer: Medicare Other | Admitting: Internal Medicine

## 2021-05-30 ENCOUNTER — Other Ambulatory Visit: Payer: Self-pay

## 2021-05-30 VITALS — BP 120/64 | HR 106 | Resp 17 | Ht 62.0 in | Wt 179.0 lb

## 2021-05-30 DIAGNOSIS — J90 Pleural effusion, not elsewhere classified: Secondary | ICD-10-CM

## 2021-05-30 DIAGNOSIS — Z79899 Other long term (current) drug therapy: Secondary | ICD-10-CM | POA: Diagnosis not present

## 2021-05-30 DIAGNOSIS — M0579 Rheumatoid arthritis with rheumatoid factor of multiple sites without organ or systems involvement: Secondary | ICD-10-CM | POA: Diagnosis not present

## 2021-05-30 DIAGNOSIS — R2681 Unsteadiness on feet: Secondary | ICD-10-CM

## 2021-05-30 MED ORDER — AZATHIOPRINE 50 MG PO TABS: 50.0000 mg | ORAL_TABLET | Freq: Every day | ORAL | 1 refills | Status: DC

## 2021-05-30 NOTE — Telephone Encounter (Signed)
Spoke to patient.  She is requesting an order to continue PT with [facility].   Dr. Francine Graven, please advise. thanks

## 2021-05-30 NOTE — Telephone Encounter (Signed)
As patient was leaving the office today, she was curious if eliquis would have any interaction with Imuran. I discussed with Dr. Dimple Casey and he said it should be fine, there is on interaction. I called patient to advise and patient verbalized understanding.

## 2021-05-31 NOTE — Telephone Encounter (Signed)
I left a message for patient that the order has been placed and she was asked to call back with any questions.

## 2021-06-03 ENCOUNTER — Telehealth: Payer: Self-pay | Admitting: Pulmonary Disease

## 2021-06-03 MED ORDER — PREDNISONE 20 MG PO TABS: 40.0000 mg | ORAL_TABLET | Freq: Every day | ORAL | 5 refills | Status: DC

## 2021-06-03 NOTE — Telephone Encounter (Signed)
I have called the patient let her spouse know that a refill has been sent to the pharmacy and he voices understanding. Nothing further needed.

## 2021-06-04 ENCOUNTER — Other Ambulatory Visit: Payer: Self-pay | Admitting: Pulmonary Disease

## 2021-06-04 DIAGNOSIS — J90 Pleural effusion, not elsewhere classified: Secondary | ICD-10-CM

## 2021-06-05 LAB — THIOPURINE METHYLTRANSFERASE (TPMT), RBC: Thiopurine Methyltransferase, RBC: 22 nmol/hr/mL RBC

## 2021-06-12 ENCOUNTER — Telehealth: Payer: Self-pay | Admitting: Pulmonary Disease

## 2021-06-12 NOTE — Telephone Encounter (Signed)
Please advise on billing code.

## 2021-06-16 NOTE — Progress Notes (Signed)
Cardiology Office Note   Date:  06/17/2021   ID:  Rebecca Short, Rebecca Short Jun 01, 1940, MRN [id number]  PCP:  Lawerance Cruel, MD    No chief complaint on file.  AFib  Wt Readings from Last 3 Encounters:  06/17/21 181 lb (82.1 kg)  05/30/21 179 lb (81.2 kg)  05/22/21 178 lb (80.7 kg)       History of Present Illness: Rebecca Short is a 81 y.o. female   with a h/o PAF.  She has chronic back pain which has limited her exercise.  In the past, Managing hydration and stress have helped minimize her palpitations.     In 3/19, it was noted: "She is spending more time in atrial fibrillation now.  It will likely be more difficult to get her out of atrial fibrillation.  Given her lack of symptoms, would not pursue antiarrhythmic drug or cardioversion at this time.   She has been treated for sleep apnea with CPAP.   She went to [facility] [employer] in 2019. She was at higher elevation and she did well.  She did not ned any extra metoprolol.   She had her COVID vaccines.  THe day After the second pfizer shot, she felt some irregularity for 30 minutes and it resolved.     She got her booster and flu shot on the same day.  Had some brief palpitations but this resolved.     In 12/2020, she had pneumonia complicated by pleural effusion and pericardial effusion.  She had thoracentesis and pericardiocentesis and AFib rates were better controlled.  She was readmittted with shortness of breath and had bilateral thoracentesis.   Rate control meds were limited by low blood pressure.  She is keeping her BP and HR records diligently. She avoids crowds since she is on prednisone.   Denies : Chest pain. Dizziness. Nitroglycerin use. Orthopnea. Palpitations. Paroxysmal nocturnal dyspnea. Syncope.     Past Medical History:  Diagnosis Date   A-fib [facility])    Allergy    Atrial fibrillation (HCC)    FH: cholecystectomy 1997   Macular degeneration    OSA (obstructive sleep apnea)    mild  with total AHI 7.25/hr and severe during REM sleep at 35/hr now on CPAP at 6cm H2O    Past Surgical History:  Procedure Laterality Date   CHOLECYSTECTOMY     PERICARDIOCENTESIS N/A 01/07/2021   Procedure: PERICARDIOCENTESIS;  Surgeon: Nelva Bush, MD;  Location: [place] [facility];  Service: Cardiovascular;  Laterality: N/A;   REPLACEMENT TOTAL KNEE BILATERAL     THORACENTESIS N/A 01/25/2021   Procedure: THORACENTESIS;  Surgeon: Freddi Starr, MD;  Location[facility] ENDOSCOPY;  Service: Pulmonary;  Laterality: N/A;   THORACENTESIS N/A 04/08/2021   Procedure: THORACENTESIS;  Surgeon: Freddi Starr, MD;  Location: [facility] ENDOSCOPY;  Service: Pulmonary;  Laterality: N/A;   THORACENTESIS N/A 04/24/2021   Procedure: THORACENTESIS;  Surgeon: Juanito Doom, MD;  Location: [place];  Service: Cardiopulmonary;  Laterality: N/A;     Current Outpatient Medications  Medication Sig Dispense Refill   acetaminophen (TYLENOL) 325 MG tablet Take 2 tablets (650 mg total) by mouth every 6 (six) hours as needed for moderate pain or mild pain.     atovaquone (MEPRON) 750 MG/5ML suspension TAKE 5 MLS (750 MG TOTAL) BY MOUTH 2 (TWO) TIMES DAILY WITH A MEAL. 320 mL 2   azaTHIOprine (IMURAN) 50 MG tablet Take 1 tablet (50 mg total) by mouth daily. [place]  tablet 1   CALCIUM CITRATE-VITAMIN D PO Take 1 tablet by mouth at bedtime.     carboxymethylcellulose (REFRESH PLUS) 0.5 % SOLN Place 1 drop into both eyes 2 (two) times daily.     ELIQUIS 5 MG TABS tablet TAKE 1 TABLET BY MOUTH TWICE A DAY (Patient taking differently: Take 5 mg by mouth 1 day or 1 dose.) 60 tablet 6   EPIPEN 2-PAK 0.3 MG/0.3ML SOAJ injection Inject 0.3 mg into the muscle once as needed for anaphylaxis (severe allergic reaction).  0   furosemide (LASIX) 20 MG tablet Take 1 tablet (20 mg total) by mouth daily. 90 tablet 1   metoprolol tartrate (LOPRESSOR) 25 MG tablet Take 0.5 tablets (12.5 mg total) by mouth daily. May increase to twice  daily as tolerated. 30 tablet 1   Multiple Vitamin (MULTIVITAMIN WITH MINERALS) TABS tablet Take 1 tablet by mouth every morning.     Multiple Vitamins-Minerals (PRESERVISION AREDS PO) Take 1 capsule by mouth 2 (two) times daily.     pantoprazole (PROTONIX) 40 MG tablet Take 1 tablet (40 mg total) by mouth daily at 12 noon. (Patient taking differently: Take 40 mg by mouth daily.) 90 tablet 2   potassium chloride (KLOR-CON) 20 MEQ packet Take 20 mEq by mouth daily. 90 each 2   predniSONE (DELTASONE) 20 MG tablet Take 2 tablets (40 mg total) by mouth daily with breakfast. 60 tablet 5   PRESCRIPTION MEDICATION every 14 (fourteen) days. Allergy shots administered by Dr. Donneta Romberg     No current facility-administered medications for this visit.    Allergies:   Erythromycin, Azithromycin, Condrolite [glucosamine-chondroitin-msm], Glucosamine, and Sulfamethoxazole-trimethoprim    Social History:  The patient  reports that she has never smoked. She has never used smokeless tobacco. She reports that she does not drink alcohol and does not use drugs.   Family History:  The patient's family history includes Cancer in her maternal grandmother; Dementia in her paternal grandmother; Diabetes in her brother; Heart Problems in her father; Kidney failure in her brother; Macular degeneration in her mother; Osteoporosis in her mother; Stroke in her father; Suicidality in an other family member.    ROS:  Please see the history of present illness.   Otherwise, review of systems are positive for legs getting stronger- now walking without walker.   All other systems are reviewed and negative.    PHYSICAL EXAM: VS:  BP 120/80    Pulse 96    Ht 5\' 2"  (1.575 m)    Wt 181 lb (82.1 kg)    SpO2 97%    BMI 33.11 kg/m  , BMI Body mass index is 33.11 kg/m. GEN: Well nourished, well developed, in no acute distress HEENT: normal Neck: no JVD, carotid bruits, or masses Cardiac: irregularly irregularly- normal rate 82 bpm  apical; no murmurs, rubs, or gallops,; tr bilateral pitting edema  Respiratory:  clear to auscultation bilaterally, normal work of breathing GI: soft, nontender, nondistended, + BS MS: no deformity or atrophy Skin: warm and dry, no rash Neuro:  Strength and sensation are intact Psych: euthymic mood, full affect    Recent Labs: 01/19/2021: ALT 31 01/23/2021: B Natriuretic Peptide 53.4 01/25/2021: Magnesium 2.0; TSH 2.104 01/30/2021: Hemoglobin 13.2; Platelets 347 02/06/2021: BUN 27; Creatinine, Ser 0.84; Potassium 4.6; Sodium 139   Lipid Panel    Component Value Date/Time   CHOL 145 08/19/2017 0816   TRIG 101 08/19/2017 0816   HDL 47 08/19/2017 0816   CHOLHDL 3.1 08/19/2017 0816  [place] 78 08/19/2017 0816     Other studies Reviewed: Additional studies/ records that were reviewed today with results demonstrating: homeBP and HR readings reviewed.   ASSESSMENT AND PLAN:  AFib: Rate control is quite good on metoprolol 12.5 twice a day.  Home readings reviewed.  Blood pressure is also very well controlled on that dose. Pericarditis: Effusion decreased in size on subsequent.  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 Eliquis for stroke prevention. Pleural effusion: s/p thoracentesis while in the hospital.  OSA: Using CPAP. Titrating RA meds with Dr. Erin Fulling and Dr. Benjamine Mola.    Current medicines are reviewed at length with the patient today.  The patient concerns regarding her medicines were addressed.  The following changes have been made:  No change  Labs/ tests ordered today include:  No orders of the defined types were placed in this encounter.   Recommend 150 minutes/week of aerobic exercise Low fat, low carb, high fiber diet recommended  Disposition:   FU in 6 months   Signed, Larae Grooms, MD  06/17/2021 1:50 PM    [facility] [place], [place], [place]  [address] Phone: [phone]; Fax: [phone]

## 2021-06-17 ENCOUNTER — Other Ambulatory Visit: Payer: Self-pay

## 2021-06-17 ENCOUNTER — Ambulatory Visit (INDEPENDENT_AMBULATORY_CARE_PROVIDER_SITE_OTHER): Payer: Medicare Other | Admitting: Interventional Cardiology

## 2021-06-17 ENCOUNTER — Encounter: Payer: Self-pay | Admitting: Interventional Cardiology

## 2021-06-17 VITALS — BP 120/80 | HR 96 | Ht 62.0 in | Wt 181.0 lb

## 2021-06-17 DIAGNOSIS — D6869 Other thrombophilia: Secondary | ICD-10-CM

## 2021-06-17 DIAGNOSIS — I4821 Permanent atrial fibrillation: Secondary | ICD-10-CM

## 2021-06-17 DIAGNOSIS — G4733 Obstructive sleep apnea (adult) (pediatric): Secondary | ICD-10-CM | POA: Diagnosis not present

## 2021-06-17 DIAGNOSIS — Z7901 Long term (current) use of anticoagulants: Secondary | ICD-10-CM | POA: Diagnosis not present

## 2021-06-17 DIAGNOSIS — E669 Obesity, unspecified: Secondary | ICD-10-CM

## 2021-06-17 NOTE — Patient Instructions (Signed)
Medication Instructions:  °Your physician recommends that you continue on your current medications as directed. Please refer to the Current Medication list given to you today. ° °*If you need a refill on your cardiac medications before your next appointment, please call your pharmacy* ° ° °Lab Work: °none °If you have labs (blood work) drawn today and your tests are completely normal, you will receive your results only by: °MyChart Message (if you have MyChart) OR °A paper copy in the mail °If you have any lab test that is abnormal or we need to change your treatment, we will call you to review the results. ° ° °Testing/Procedures: °none ° ° °Follow-Up: °At [facility], you and your health needs are our priority.  As part of our continuing mission to provide you with exceptional heart care, we have created designated Provider Care Teams.  These Care Teams include your primary Cardiologist (physician) and Advanced Practice Providers (APPs -  Physician Assistants and Nurse Practitioners) who all work together to provide you with the care you need, when you need it. ° °We recommend signing up for the patient portal called "MyChart".  Sign up information is provided on this After Visit Summary.  MyChart is used to connect with patients for Virtual Visits (Telemedicine).  Patients are able to view lab/test results, encounter notes, upcoming appointments, etc.  Non-urgent messages can be sent to your provider as well.   °To learn more about what you can do with MyChart, go to [email or web address].   ° °Your next appointment:   °6 month(s) ° °The format for your next appointment:   °In Person ° °Provider:   °Jayadeep Varanasi, MD   ° ° °Other Instructions ° ° °

## 2021-06-18 ENCOUNTER — Encounter (INDEPENDENT_AMBULATORY_CARE_PROVIDER_SITE_OTHER): Payer: Self-pay | Admitting: Ophthalmology

## 2021-06-18 ENCOUNTER — Ambulatory Visit (INDEPENDENT_AMBULATORY_CARE_PROVIDER_SITE_OTHER): Payer: Medicare Other | Admitting: Ophthalmology

## 2021-06-18 DIAGNOSIS — H353221 Exudative age-related macular degeneration, left eye, with active choroidal neovascularization: Secondary | ICD-10-CM | POA: Diagnosis not present

## 2021-06-18 DIAGNOSIS — H35372 Puckering of macula, left eye: Secondary | ICD-10-CM | POA: Diagnosis not present

## 2021-06-18 DIAGNOSIS — G4733 Obstructive sleep apnea (adult) (pediatric): Secondary | ICD-10-CM | POA: Diagnosis not present

## 2021-06-18 DIAGNOSIS — H35371 Puckering of macula, right eye: Secondary | ICD-10-CM | POA: Diagnosis not present

## 2021-06-18 MED ORDER — RANIBIZUMAB 0.5 MG/0.05ML IZ SOSY
0.5000 mg | PREFILLED_SYRINGE | INTRAVITREAL | Status: AC | PRN
  Administered 2021-06-18: 14:00:00 .5 mg via INTRAVITREAL

## 2021-06-18 NOTE — Assessment & Plan Note (Signed)
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  Repeat injection today and follow-up again in 4 months

## 2021-06-18 NOTE — Assessment & Plan Note (Signed)
Minor, no topographic distortion on the fovea observe

## 2021-06-18 NOTE — Progress Notes (Signed)
06/18/2021     CHIEF COMPLAINT Patient presents for  Chief Complaint  Patient presents with   Retina Follow Up      HISTORY OF PRESENT ILLNESS: Rebecca Short is a 81 y.o. female who presents to the clinic today for:   HPI     Retina Follow Up           Laterality: left eye   Onset: 4   Severity: mild   Duration: 4   Course: stable         Comments   4 mos fu OU oct Lucentis OS. Patient states vision is stable and unchanged since last visit. Denies any new floaters or FOL. Pt allergy to erythromycin and azithromycin.      Last edited by Nelva Nay on 06/18/2021  1:47 PM.      Referring physician: Daisy Floro, MD [address] [place],  [place] [address]  HISTORICAL INFORMATION:   Selected notes from the MEDICAL RECORD NUMBER    Lab Results  Component Value Date   HGBA1C 5.4 12/23/2020     CURRENT MEDICATIONS: Current Outpatient Medications (Ophthalmic Drugs)  Medication Sig   carboxymethylcellulose (REFRESH PLUS) 0.5 % SOLN Place 1 drop into both eyes 2 (two) times daily.   No current facility-administered medications for this visit. (Ophthalmic Drugs)   Current Outpatient Medications (Other)  Medication Sig   acetaminophen (TYLENOL) 325 MG tablet Take 2 tablets (650 mg total) by mouth every 6 (six) hours as needed for moderate pain or mild pain.   atovaquone (MEPRON) 750 MG/5ML suspension TAKE 5 MLS (750 MG TOTAL) BY MOUTH 2 (TWO) TIMES DAILY WITH A MEAL.   azaTHIOprine (IMURAN) 50 MG tablet Take 1 tablet (50 mg total) by mouth daily.   CALCIUM CITRATE-VITAMIN D PO Take 1 tablet by mouth at bedtime.   ELIQUIS 5 MG TABS tablet TAKE 1 TABLET BY MOUTH TWICE A DAY (Patient taking differently: Take 5 mg by mouth 1 day or 1 dose.)   EPIPEN 2-PAK 0.3 MG/0.3ML SOAJ injection Inject 0.3 mg into the muscle once as needed for anaphylaxis (severe allergic reaction).   furosemide (LASIX) 20 MG tablet Take 1 tablet (20 mg total) by mouth daily.    metoprolol tartrate (LOPRESSOR) 25 MG tablet Take 0.5 tablets (12.5 mg total) by mouth daily. May increase to twice daily as tolerated.   Multiple Vitamin (MULTIVITAMIN WITH MINERALS) TABS tablet Take 1 tablet by mouth every morning.   Multiple Vitamins-Minerals (PRESERVISION AREDS PO) Take 1 capsule by mouth 2 (two) times daily.   pantoprazole (PROTONIX) 40 MG tablet Take 1 tablet (40 mg total) by mouth daily at 12 noon. (Patient taking differently: Take 40 mg by mouth daily.)   potassium chloride (KLOR-CON) 20 MEQ packet Take 20 mEq by mouth daily.   predniSONE (DELTASONE) 20 MG tablet Take 2 tablets (40 mg total) by mouth daily with breakfast.   PRESCRIPTION MEDICATION every 14 (fourteen) days. Allergy shots administered by Dr. Sylva Callas   No current facility-administered medications for this visit. (Other)      REVIEW OF SYSTEMS: ROS   Negative for: Constitutional, Gastrointestinal, Neurological, Skin, Genitourinary, Musculoskeletal, HENT, Endocrine, Cardiovascular, Eyes, Respiratory, Psychiatric, Allergic/Imm, Heme/Lymph Last edited by Edmon Crape, MD on 06/18/2021  2:28 PM.       ALLERGIES Allergies  Allergen Reactions   Erythromycin Other (See Comments)    Other reaction(s): burning   Azithromycin Other (See Comments)    Pain in arm with  IV infusion    Condrolite [Glucosamine-Chondroitin-Msm] Rash   Glucosamine Rash    Other reaction(s): rash Other reaction(s): rash   Sulfamethoxazole-Trimethoprim Rash    Other reaction(s): rash Other reaction(s): rash    PAST MEDICAL HISTORY Past Medical History:  Diagnosis Date   A-fib (HCC)    Allergy    Atrial fibrillation (HCC)    FH: cholecystectomy 1997   Macular degeneration    OSA (obstructive sleep apnea)    mild with total AHI 7.25/hr and severe during REM sleep at 35/hr now on CPAP at 6cm H2O   Past Surgical History:  Procedure Laterality Date   CHOLECYSTECTOMY     PERICARDIOCENTESIS N/A 01/07/2021   Procedure:  PERICARDIOCENTESIS;  Surgeon: Yvonne Kendall, MD;  Location: [facility]VASIVE [facility];  Service: Cardiovascular;  Laterality: N/A;   REPLACEMENT TOTAL KNEE BILATERAL     THORACENTESIS N/A 01/25/2021   Procedure: THORACENTESIS;  Surgeon: Martina Sinner, MD;  Location: [facility] ENDOSCOPY;  Service: Pulmonary;  Laterality: N/A;   THORACENTESIS N/A 04/08/2021   Procedure: THORACENTESIS;  Surgeon: Martina Sinner, MD;  Location: [facility] ENDOSCOPY;  Service: Pulmonary;  Laterality: N/A;   THORACENTESIS N/A 04/24/2021   Procedure: THORACENTESIS;  Surgeon: Lupita Leash, MD;  Location: [facility] ENDOSCOPY;  Service: Cardiopulmonary;  Laterality: N/A;    FAMILY HISTORY Family History  Problem Relation Age of Onset   Osteoporosis Mother    Macular degeneration Mother    Heart Problems Father    Stroke Father    Diabetes Brother    Kidney failure Brother    Cancer Maternal Grandmother    Dementia Paternal Grandmother    Suicidality Other    Heart attack Neg Hx     SOCIAL HISTORY Social History   Tobacco Use   Smoking status: Never   Smokeless tobacco: Never  Vaping Use   Vaping Use: Never used  Substance Use Topics   Alcohol use: Never   Drug use: Never         OPHTHALMIC EXAM:  Base Eye Exam     Visual Acuity (ETDRS)       Right Left   Dist cc 20/25 -2 20/25 -1+2    Correction: Glasses         Tonometry (Tonopen, 1:49 PM)       Right Left   Pressure 17 19         Pupils       Pupils Dark Light APD   Right PERRL 3 2 None   Left PERRL 3 2 None         Visual Fields       Left Right    Full Full         Extraocular Movement       Right Left    Full Full         Neuro/Psych     Oriented x3: Yes   Mood/Affect: Normal         Dilation     Both eyes: 1.0% Mydriacyl, 2.5% Phenylephrine @ 1:49 PM           Slit Lamp and Fundus Exam     External Exam       Right Left   External Normal Normal         Slit Lamp Exam       Right Left    Lids/Lashes Normal Normal   Conjunctiva/Sclera White and quiet White and quiet   Cornea Clear Clear   Anterior  Chamber Deep and quiet Deep and quiet   Iris Round and reactive Round and reactive   Lens Posterior chamber intraocular lens Posterior chamber intraocular lens   Anterior Vitreous Normal Normal         Fundus Exam       Right Left   Posterior Vitreous Posterior vitreous detachment Posterior vitreous detachment   Disc Normal Normal   C/D Ratio 0.55 0.25   Macula Retinal pigment epithelial mottling, Early age related macular degeneration, no hemorrhage, no exudates, no macular thickening, Hard drusen Retinal pigment epithelial mottling, Early age related macular degeneration, Pigmented atrophy, no hemorrhage, no exudates, no macular thickening, Soft drusen   Vessels Normal Normal   Periphery Normal Normal            IMAGING AND PROCEDURES  Imaging and Procedures for 06/18/21  Intravitreal Injection, Pharmacologic Agent - OS - Left Eye       Time Out 06/18/2021. 2:29 PM. Confirmed correct patient, procedure, site, and patient consented.   Anesthesia No anesthesia was used. Anesthetic medications included Lidocaine 4%.   Procedure Preparation included Tobramycin 0.3%, 10% betadine to eyelids, 5% betadine to ocular surface, Ofloxacin . A 30 gauge needle was used.   Injection: 0.5 mg Ranibizumab 0.5 MG/0.05ML   Route: Intravitreal, Site: Left Eye   [id number], Lot: [id number], Waste: 0 mL   Post-op Post injection exam found visual acuity of at least counting fingers. The patient tolerated the procedure well. There were no complications. The patient received written and verbal post procedure care education. Post injection medications included ocuflox.      OCT, Retina - OU - Both Eyes       Right Eye Quality was good. Scan locations included subfoveal. Central Foveal Thickness: 297. Progression has improved. Findings include abnormal foveal contour,  retinal drusen , no SRF, no IRF, epiretinal membrane.   Left Eye Quality was good. Scan locations included subfoveal. Central Foveal Thickness: 295. Progression has improved. Findings include no IRF, no SRF, abnormal foveal contour, epiretinal membrane.   Notes OS, remained stable at  4 months interval with history of recurrences, will repeat intravitreal Lucentis today and examination again in 4 months  We will continue to monitor region temporal to the fovea with irregular drusenoid pigment epithelial detachment as well as subretinal fluid  Minor epiretinal membrane right eye with no topographic distortion observed             ASSESSMENT/PLAN:  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HCC) OS today remained stable at 75-month follow-up interval post injection Lucentis for stabilization of recurrent CNVM in the past.  Repeat injection today and follow-up again in 4 months  Macular pucker, right eye Minor, no topographic distortion on the fovea observe  Macular pucker, left eye OS, nasal to FAZ, no impact on acuity observe  OSA (obstructive sleep apnea) Continues to be compliant with CPAP use since 2015     ICD-10-CM   1. 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HCC)  H35.3221 Intravitreal Injection, Pharmacologic Agent - OS - Left Eye    OCT, Retina - OU - Both Eyes    Ranibizumab SOSY 0.5 mg    2. Macular pucker, right eye  H35.371     3. Macular pucker, left eye  H35.372     4. OSA (obstructive sleep apnea)  G47.33       1.  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  2.  Repeat Lucentis OS today follow-up next again, dilate OU in 4 months  3.  Ophthalmic Meds Ordered this visit:  Meds ordered this encounter  Medications   Ranibizumab SOSY 0.5 mg       Return in about  4 months (around 10/16/2021) for DILATE OU, LUCENTIS 0.5 OCT, OS.  There are no Patient Instructions on file for this visit.   Explained the diagnoses, plan, and follow up with the patient and they expressed understanding.  Patient expressed understanding of the importance of proper follow up care.   Clent Demark Tabrina Esty M.D. Diseases & Surgery of the Retina and Vitreous [facility] [place] 06/18/21     Abbreviations: M myopia (nearsighted); A astigmatism; H hyperopia (farsighted); P presbyopia; Mrx spectacle prescription;  CTL contact lenses; OD right eye; OS left eye; OU both eyes  XT exotropia; ET esotropia; PEK punctate epithelial keratitis; PEE punctate epithelial erosions; DES dry eye syndrome; MGD meibomian gland dysfunction; ATs artificial tears; PFAT's preservative free artificial tears; [place] nuclear sclerotic cataract; PSC posterior subcapsular cataract; ERM epi-retinal membrane; PVD posterior vitreous detachment; RD retinal detachment; DM diabetes mellitus; DR diabetic retinopathy; NPDR non-proliferative diabetic retinopathy; PDR proliferative diabetic retinopathy; CSME clinically significant macular edema; DME diabetic macular edema; dbh dot blot hemorrhages; CWS cotton wool spot; POAG primary open angle glaucoma; C/D cup-to-disc ratio; HVF humphrey visual field; GVF goldmann visual field; OCT optical coherence tomography; IOP intraocular pressure; BRVO Branch retinal vein occlusion; CRVO central retinal vein occlusion; CRAO central retinal artery occlusion; BRAO branch retinal artery occlusion; RT retinal tear; SB scleral buckle; PPV pars plana vitrectomy; VH Vitreous hemorrhage; PRP panretinal laser photocoagulation; IVK intravitreal kenalog; VMT vitreomacular traction; MH Macular hole;  NVD neovascularization of the disc; NVE neovascularization elsewhere; AREDS age related eye disease study; ARMD age related macular degeneration; POAG primary open angle glaucoma; EBMD  epithelial/anterior basement membrane dystrophy; ACIOL anterior chamber intraocular lens; IOL intraocular lens; PCIOL posterior chamber intraocular lens; Phaco/IOL phacoemulsification with intraocular lens placement; [place] photorefractive keratectomy; LASIK laser assisted in situ keratomileusis; HTN hypertension; DM diabetes mellitus; COPD chronic obstructive pulmonary disease

## 2021-06-18 NOTE — Assessment & Plan Note (Signed)
Continues to be compliant with CPAP use since 2015

## 2021-06-18 NOTE — Assessment & Plan Note (Signed)
OS, nasal to FAZ, no impact on acuity observe

## 2021-06-19 ENCOUNTER — Ambulatory Visit: Payer: Medicare Other | Admitting: Rheumatology

## 2021-06-21 ENCOUNTER — Other Ambulatory Visit: Payer: Self-pay | Admitting: Internal Medicine

## 2021-06-21 DIAGNOSIS — M0579 Rheumatoid arthritis with rheumatoid factor of multiple sites without organ or systems involvement: Secondary | ICD-10-CM

## 2021-06-21 NOTE — Telephone Encounter (Signed)
Waiting until initial clinic f/u for repeat labs after starting before continuing medication 90 days at a time.

## 2021-06-25 ENCOUNTER — Ambulatory Visit (INDEPENDENT_AMBULATORY_CARE_PROVIDER_SITE_OTHER): Payer: Medicare Other | Admitting: Pulmonary Disease

## 2021-06-25 ENCOUNTER — Encounter: Payer: Self-pay | Admitting: Pulmonary Disease

## 2021-06-25 ENCOUNTER — Other Ambulatory Visit: Payer: Self-pay

## 2021-06-25 ENCOUNTER — Telehealth: Payer: Self-pay

## 2021-06-25 ENCOUNTER — Other Ambulatory Visit (HOSPITAL_COMMUNITY): Payer: Self-pay

## 2021-06-25 ENCOUNTER — Telehealth: Payer: Self-pay | Admitting: Pulmonary Disease

## 2021-06-25 ENCOUNTER — Ambulatory Visit (INDEPENDENT_AMBULATORY_CARE_PROVIDER_SITE_OTHER): Payer: Medicare Other

## 2021-06-25 VITALS — BP 118/74 | HR 83 | Ht 62.0 in | Wt 180.2 lb

## 2021-06-25 DIAGNOSIS — J90 Pleural effusion, not elsewhere classified: Secondary | ICD-10-CM | POA: Diagnosis not present

## 2021-06-25 DIAGNOSIS — K658 Other peritonitis: Secondary | ICD-10-CM

## 2021-06-25 NOTE — Telephone Encounter (Signed)
Please submit Tier exception in CMMs.

## 2021-06-25 NOTE — Telephone Encounter (Signed)
Received additional questions for Tier exception, completed and faxed in to Plan.

## 2021-06-25 NOTE — Patient Instructions (Addendum)
We will check a chest x-ray today to monitor the pleural effusion  Continue 40mg  prednisone until imuran is at therapeutic dosing per Dr. Benjamine Mola. Then we will plan to drop to 30mg  daily and then taper by 5mg  every month there after.  Continue atovaquone prophylactic antibiotic until on 20mg  of prednisone daily or lower.

## 2021-06-25 NOTE — Telephone Encounter (Signed)
Patient was seen by Dr. Francine Graven today and was told to stay on the atovaquone solution. She stated that she spoke with her insurance company twice due to the price of the medication. She was told by the insurance both times that they faxed paperwork to our office to be completed to lower the price of the medication.   She could not remember if it was a tier exception.   I advised her that we have not received any correspondence from her insurance company.   She wanted to know if we could contact her insurance company to start the possible tier exception.   PA Team, is this something you all can help with? Thank you!

## 2021-06-25 NOTE — Telephone Encounter (Signed)
Patient Advocate Encounter   Received notification from patient calls that prior authorization for Atovaquone 750mg /23ml suspension is required by his/her insurance [place] [place] [employer].   PA submitted on 06/25/21  Key#: [id number]  Status is pending    [facility] will continue to follow:  Patient Advocate Fax: [phone]

## 2021-06-25 NOTE — Progress Notes (Signed)
Synopsis: Referred in October 2022 for hospital follow up for pleural effusions  Subjective:   PATIENT ID: [name] GENDER: female DOB: November 29, 1940, MRN: [id number]  HPI  Chief Complaint  Patient presents with   Follow-up    1 mo f/u states she has been doing well since last visit.    Nikyra Baird is an 81 year old woman, never smoker with history of obstructive sleep apnea and atrial fibrillation who returns to pulmonary clinic for follow up of serositis and chronic left pleural effusion.   She has been doing well since last visit. She was started on imuran per Dr. Dimple Casey and she has follow up with him on 07/09/21. She remains on 40mg  of prednisone daily. She is working with home PT with good results. She reports her daily activities are increasing as she is now able to vacuum 2 rooms in her home compared to 1 previously.   Chest radiograph today shows chronic left pleural effusion, stable from last month.  OV 05/22/21 She was evaluated by Dr. 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 Anti-smooth muscle anitbody is elevated. Cryoglobulins are negative.   Discussed pleural fluid studies further with Dr. Kenard Gower which showed mixed granulocytes and histioctyes along with some multinucleated giant cells. A definite diagnosis can't be placed on these findings, but he did raise concern for rheumatoid arthritis.   These upates were shared with the patient, her husband and friend.   Chest x-ray today shows persistent left pleural effusion and clear right lung.   Patient is feeling much better after increasing her prednisone back to 40mg  daily. She remains on prophylactic antibiotics. Her shoulder pain has also resolved with the prednisone.   She continues to work with PT each week and feels her conditioning in improving.  OV 04/24/21 She was seen in acute visit on 11/28 by [name], NP for increasing shortness of breath and pleuritic  chest pain. She had tapered down to 5mg  of prednisone at this time. She had repeat thoracentesis on 04/08/21 based on CT chest from 04/22/21 which continued to show persistent left pleural effusion. Post-thoracentesis x-ray showed left pleural effusion and small right pleural effusion. Chest x-ray on 11/28 shows increased left pleural effusion.   Patient complains of increasing shortness of breath, bilateral shoulder pain and increasing left-sided discomfort.  Labs from visit on 11/16 showed rheumatoid factor I30 previously 57.2 on 01/21/2021.  P ANCA titer was 1:80 on 04/10/2021.  The following labs were negative on 04/10/2021: ANA, anti-CCP, dsDNA antibody and previously negative for SSA and SSB and antihistone antibodies.  OV 03/06/21 She was admitted 8/27 to 9/7 for pericardial effusion and bilateral pleural effusions. She is status post pericardial drain and multiple thoracenteses. The pleural fluid studies were consistent with exudative process which were initially neutrophil predominant that later became lymphocyte predominant. Cytology was negative for malignancy but showed reactive mesothelial cells.  She is being treated for serositis with prednisone taper and colchicine for the pericardial effusion. She has been on 40mg  of prednisone over the past month. She has been on pneumocystis prophylaxis with atovaquone, she developed skin rash with bactrim. Her inflammatory workup was rather unrevealing as ANA, CCP anti-histone ab and SSA/SSB were negative. Her rheumatoid factor was elevated at 57.2.   She is feeling significantly better since discharge is resuming her normal activity and is quite satisfied with her recovering thus far.   Chest radiograph today shows opacities at the left lung base.  The right lung is significantly improved since prior study with resolution of right pleural effusion.  Past Medical History:  Diagnosis Date   A-fib [facility])    Allergy    Atrial fibrillation (HCC)     FH: cholecystectomy 1997   Macular degeneration    OSA (obstructive sleep apnea)    mild with total AHI 7.25/hr and severe during REM sleep at 35/hr now on CPAP at 6cm H2O     Family History  Problem Relation Age of Onset   Osteoporosis Mother    Macular degeneration Mother    Heart Problems Father    Stroke Father    Diabetes Brother    Kidney failure Brother    Cancer Maternal Grandmother    Dementia Paternal Grandmother    Suicidality Other    Heart attack Neg Hx      Social History   Socioeconomic History   Marital status: Married    Spouse name: Not on file   Number of children: Not on file   Years of education: Not on file   Highest education level: Not on file  Occupational History   Not on file  Tobacco Use   Smoking status: Never   Smokeless tobacco: Never  Vaping Use   Vaping Use: Never used  Substance and Sexual Activity   Alcohol use: Never   Drug use: Never   Sexual activity: Not on file  Other Topics Concern   Not on file  Social History Narrative   ** Merged History Encounter **       Social Determinants of Health   Financial Resource Strain: Not on file  Food Insecurity: Not on file  Transportation Needs: Not on file  Physical Activity: Not on file  Stress: Not on file  Social Connections: Not on file  Intimate Partner Violence: Not on file     Allergies  Allergen Reactions   Erythromycin Other (See Comments)    Other reaction(s): burning   Azithromycin Other (See Comments)    Pain in arm with IV infusion    Condrolite [Glucosamine-Chondroitin-Msm] Rash   Glucosamine Rash    Other reaction(s): rash Other reaction(s): rash   Sulfamethoxazole-Trimethoprim Rash    Other reaction(s): rash Other reaction(s): rash     Outpatient Medications Prior to Visit  Medication Sig Dispense Refill   acetaminophen (TYLENOL) 325 MG tablet Take 2 tablets (650 mg total) by mouth every 6 (six) hours as needed for moderate pain or mild pain.      atovaquone (MEPRON) 750 MG/5ML suspension TAKE 5 MLS (750 MG TOTAL) BY MOUTH 2 (TWO) TIMES DAILY WITH A MEAL. 320 mL 2   azaTHIOprine (IMURAN) 50 MG tablet Take 1 tablet (50 mg total) by mouth daily. 30 tablet 1   CALCIUM CITRATE-VITAMIN D PO Take 1 tablet by mouth at bedtime.     carboxymethylcellulose (REFRESH PLUS) 0.5 % SOLN Place 1 drop into both eyes 2 (two) times daily.     ELIQUIS 5 MG TABS tablet TAKE 1 TABLET BY MOUTH TWICE A DAY (Patient taking differently: Take 5 mg by mouth 1 day or 1 dose.) 60 tablet 6   EPIPEN 2-PAK 0.3 MG/0.3ML SOAJ injection Inject 0.3 mg into the muscle once as needed for anaphylaxis (severe allergic reaction).  0   furosemide (LASIX) 20 MG tablet Take 1 tablet (20 mg total) by mouth daily. 90 tablet 1   metoprolol tartrate (LOPRESSOR) 25 MG tablet Take 0.5 tablets (12.5 mg total) by mouth daily. May increase  to twice daily as tolerated. 30 tablet 1   Multiple Vitamin (MULTIVITAMIN WITH MINERALS) TABS tablet Take 1 tablet by mouth every morning.     Multiple Vitamins-Minerals (PRESERVISION AREDS PO) Take 1 capsule by mouth 2 (two) times daily.     pantoprazole (PROTONIX) 40 MG tablet Take 1 tablet (40 mg total) by mouth daily at 12 noon. (Patient taking differently: Take 40 mg by mouth daily.) 90 tablet 2   potassium chloride (KLOR-CON) 20 MEQ packet Take 20 mEq by mouth daily. 90 each 2   predniSONE (DELTASONE) 20 MG tablet Take 2 tablets (40 mg total) by mouth daily with breakfast. 60 tablet 5   PRESCRIPTION MEDICATION every 14 (fourteen) days. Allergy shots administered by Dr. [place] Callas     No facility-administered medications prior to visit.    Review of Systems  Constitutional:  Negative for chills, fever, malaise/fatigue and weight loss.  HENT:  Negative for congestion, sinus pain and sore throat.   Eyes: Negative.   Respiratory:  Positive for shortness of breath. Negative for cough, hemoptysis, sputum production and wheezing.   Cardiovascular:  Negative  for chest pain, palpitations, orthopnea, claudication and leg swelling.  Gastrointestinal:  Negative for abdominal pain, heartburn, nausea and vomiting.  Genitourinary: Negative.   Musculoskeletal:  Negative for joint pain and myalgias.  Skin:  Negative for rash.  Neurological:  Negative for weakness.  Endo/Heme/Allergies: Negative.   Psychiatric/Behavioral: Negative.     Objective:   Vitals:   06/25/21 0936  BP: 118/74  Pulse: 83  SpO2: 99%  Weight: 180 lb 3.2 oz (81.7 kg)  Height:  (1.575 m)    Physical Exam Constitutional:      General: She is not in acute distress.    Appearance: She is not ill-appearing.  HENT:     Head: Normocephalic and atraumatic.  Eyes:     Conjunctiva/sclera: Conjunctivae normal.  Cardiovascular:     Rate and Rhythm: Normal rate and regular rhythm.     Pulses: Normal pulses.     Heart sounds: Normal heart sounds. No murmur heard. Pulmonary:     Effort: Pulmonary effort is normal.     Breath sounds: Examination of the left-middle field reveals decreased breath sounds. Examination of the left-lower field reveals decreased breath sounds. Decreased breath sounds present. No wheezing, rhonchi or rales.  Musculoskeletal:     Right lower leg: No edema.     Left lower leg: No edema.  Skin:    General: Skin is warm and dry.  Neurological:     General: No focal deficit present.     Mental Status: She is alert.  Psychiatric:        Mood and Affect: Mood normal.        Behavior: Behavior normal.        Thought Content: Thought content normal.        Judgment: Judgment normal.   CBC    Component Value Date/Time   WBC 12.0 (H) 01/30/2021 0222   RBC 4.52 01/30/2021 0222   HGB 13.2 01/30/2021 0222   HGB 15.7 12/27/2018 0831   HCT 39.7 01/30/2021 0222   HCT 47.4 (H) 12/27/2018 0831   PLT 347 01/30/2021 0222   PLT 213 12/27/2018 0831   MCV 87.8 01/30/2021 0222   MCV 90 12/27/2018 0831   MCH 29.2 01/30/2021 0222   MCHC 33.2 01/30/2021 0222    RDW 14.8 01/30/2021 0222   RDW 13.1 12/27/2018 0831   LYMPHSABS 1.2 01/19/2021 1803  MONOABS 1.0 01/19/2021 1803   EOSABS 0.0 01/19/2021 1803   BASOSABS 0.0 01/19/2021 1803   BMP Latest Ref Rng & Units 02/06/2021 01/30/2021 01/29/2021  Glucose 65 - 99 mg/dL 478(G178(H) 956(O124(H) 130(Q127(H)  BUN 8 - 27 mg/dL 27 65(H26(H) 84(O27(H)  Creatinine 0.57 - 1.00 mg/dL 9.620.84 9.520.86 8.410.80  BUN/Creat Ratio 12 - 28 32(H) - -  Sodium 134 - 144 mmol/L 139 135 136  Potassium 3.5 - 5.2 mmol/L 4.6 4.1 4.1  Chloride 96 - 106 mmol/L 98 101 106  CO2 20 - 29 mmol/L 25 28 18(L)  Calcium 8.7 - 10.3 mg/dL 8.8 3.2(G8.5(L) 4.0(N8.4(L)   Chest imaging: CXR 05/22/21 There is a small to medium size left pleural effusion, not significantly changed since 04/24/2021. Aeration of the left lung is also not significantly changed. The previously seen small right effusion has resolved. The right lung is now clear. There is no pneumothorax.  CXR 04/22/21 Increasing LEFT effusion and associated airspace disease. Correlate with any signs of infection. Given persistence/worsening underlying pulmonary or bronchial lesion would be difficult to exclude. Correlate with results of prior thoracentesis and consider follow-up chest CT for further evaluation as warranted.   Trace RIGHT effusion with basilar atelectasis.  CXR 03/06/21 Resolved right-sided pleural effusion with persistent opacity on the left, likely a combination of persisting pleural fluid and associated atelectasis/consolidation  CXR 01/30/21 1. Slight interval decrease in size of the right pleural effusion with improved aeration of the right base. 2. No significant interval change of the larger left pleural effusion and adjacent airspace disease.  CT Chest 01/03/21 Moderate left and small right pleural effusions, which is increased on the left and new on the right in comparison to prior CT. Adjacent bibasilar and lingular atelectasis.   Increased, moderate-sized pericardial effusion.  Biatrial enlargement.   Mild pulmonary edema.  PFT: No flowsheet data found.  Labs: 01/21/21: Quantiferon gold indeterminate  Path:  Echo 01/20/21:  1. Left ventricular ejection fraction, by estimation, is 60 to 65%. The  left ventricle has normal function. The left ventricle has no regional  wall motion abnormalities.   2. A small pericardial effusion is present. There is no evidence of  cardiac tamponade.  Heart Catheterization:   Assessment & Plan:   Serositis (HCC)  Pleural effusion on left - Plan: DG Chest 2 View  Discussion: Neldon LabellaJanet Fielder is an 81 year old woman, never smoker with history of obstructive sleep apnea and atrial fibrillation who returns to pulmonary clinic for follow up of serositis and left pleural effusion.  She continues to improve physically but radiographically the left pleural effusion remains and is likely trapped lung at this time given the chronic inflammatory nature of there pleural fluid.   She is to continue on prednisone 40mg  daily and prophylactic atovaquone until she has reached therapeutic dosing on imuran per rheumatology. We will then begin slow steroid taper. She is to continue on atovaquone therapy until below 20mg  of prednisone daily.   Follow-up in 4 weeks.  Melody ComasJonathan Vandora Jaskulski, MD [facility] Office: [phone]   Current Outpatient Medications:    acetaminophen (TYLENOL) 325 MG tablet, Take 2 tablets (650 mg total) by mouth every 6 (six) hours as needed for moderate pain or mild pain., Disp: , Rfl:    atovaquone (MEPRON) 750 MG/5ML suspension, TAKE 5 MLS (750 MG TOTAL) BY MOUTH 2 (TWO) TIMES DAILY WITH A MEAL., Disp: 320 mL, Rfl: 2   azaTHIOprine (IMURAN) 50 MG tablet, Take 1 tablet (50  mg total) by mouth daily., Disp: 30 tablet, Rfl: 1   CALCIUM CITRATE-VITAMIN D PO, Take 1 tablet by mouth at bedtime., Disp: , Rfl:    carboxymethylcellulose (REFRESH PLUS) 0.5 % SOLN, Place 1 drop into both eyes 2 (two) times  daily., Disp: , Rfl:    ELIQUIS 5 MG TABS tablet, TAKE 1 TABLET BY MOUTH TWICE A DAY (Patient taking differently: Take 5 mg by mouth 1 day or 1 dose.), Disp: 60 tablet, Rfl: 6   EPIPEN 2-PAK 0.3 MG/0.3ML SOAJ injection, Inject 0.3 mg into the muscle once as needed for anaphylaxis (severe allergic reaction)., Disp: , Rfl: 0   furosemide (LASIX) 20 MG tablet, Take 1 tablet (20 mg total) by mouth daily., Disp: 90 tablet, Rfl: 1   metoprolol tartrate (LOPRESSOR) 25 MG tablet, Take 0.5 tablets (12.5 mg total) by mouth daily. May increase to twice daily as tolerated., Disp: 30 tablet, Rfl: 1   Multiple Vitamin (MULTIVITAMIN WITH MINERALS) TABS tablet, Take 1 tablet by mouth every morning., Disp: , Rfl:    Multiple Vitamins-Minerals (PRESERVISION AREDS PO), Take 1 capsule by mouth 2 (two) times daily., Disp: , Rfl:    pantoprazole (PROTONIX) 40 MG tablet, Take 1 tablet (40 mg total) by mouth daily at 12 noon. (Patient taking differently: Take 40 mg by mouth daily.), Disp: 90 tablet, Rfl: 2   potassium chloride (KLOR-CON) 20 MEQ packet, Take 20 mEq by mouth daily., Disp: 90 each, Rfl: 2   predniSONE (DELTASONE) 20 MG tablet, Take 2 tablets (40 mg total) by mouth daily with breakfast., Disp: 60 tablet, Rfl: 5   PRESCRIPTION MEDICATION, every 14 (fourteen) days. Allergy shots administered by Dr. Taney Callas, Disp: , Rfl:

## 2021-06-27 ENCOUNTER — Ambulatory Visit: Payer: Medicare Other | Admitting: Internal Medicine

## 2021-07-08 NOTE — Progress Notes (Signed)
Office Visit Note  Patient: Rebecca Short             Date of Birth: 1940/06/21           MRN: [id number]             PCP: Lawerance Cruel, MD Referring: Lawerance Cruel, MD Visit Date: 07/09/2021   Subjective:   History of Present Illness: Rebecca Short is a 81 y.o. female here for follow up for rheumatoid pleural effusions after starting azathioprine 50 mg last month, continuing prednisone 40 mg daily and prophylactic atovaquone. She went for repeat chest xray with Dr. Erin Fulling that looked stable 2 weeks ago. So far she denies any major side effects after starting imuran.  Previous HPI 05/30/21 Rebecca Short is a 81 y.o. female here for follow up for recurrent rheumatoid pleural effusions currently on prednisone 40 mg daily. No significant events or changes since our last visit.  Labs checked at initial workup including SPEP without monoclonal gammopathy, normal vitamin D and 1,25 D levels, negative cryoglobulins and normal immunoglobulin titers. Anti smooth muscle antibodies checked due to atypical ANCA titers was positive at a low level of 40.   Previous HPI 05/08/21 Rebecca RUVALCABA is a 81 y.o. female here for evaluation with recurrent pleural effusions and dyspnea with positive RF and ANCA Abs. She was hospitalized in August after progressively worsening dyspnea and hypotension with exudative pleural fluid removed from left lung space followed by pericardial drainage and initial treatment with colchicine. She subsequently underwent additional thoracentesis drainage of effusions later in August and beginning of September. Results showed neutrophil predominant fluid. Prednisone 40 mg daily was added to treatment with stable condition. This continued for a month but tapering the prednisone resulted in more rapid re accumulation of fluid on a dose of about 20 gm daily or less. Imaging demonstrated progressive increase again in left sided effusions and repeat drainage again on 11/14 and 11/30  when down to 10 mg and 5 mg daily doses of prednisone along with colchicine. She is now continuing prednisone 40 mg with atovaquone prophylaxis and colchicine 0.6 mg daily. In addition to pulmonary symptoms she reports bilateral shoulder pain and stiffness developing around the initial onset of her dyspnea before hospitalization in August.  This improved completely on 40 mg prednisone but she noticed definite recurrence of shoulder pain symptoms when tapering down the prednisone to 10 or 5 mg/day dose.  No active complaints today since she is back on higher dose medication.  She denies any peripheral joint pain involvement did not notice any significant swelling or numbness in her upper extremities associated with the pain.   Review of Systems  Constitutional:  Positive for fatigue.  HENT:  Negative for mouth sores, mouth dryness and nose dryness.   Eyes:  Positive for dryness. Negative for pain and itching.  Respiratory:  Negative for shortness of breath and difficulty breathing.   Cardiovascular:  Negative for chest pain and palpitations.  Gastrointestinal:  Negative for blood in stool, constipation and diarrhea.  Endocrine: Negative for increased urination.  Genitourinary:  Negative for difficulty urinating.  Musculoskeletal:  Negative for joint pain, joint pain, joint swelling, myalgias, morning stiffness, muscle tenderness and myalgias.  Skin:  Negative for color change, rash and redness.  Allergic/Immunologic: Negative for susceptible to infections.  Neurological:  Positive for weakness. Negative for dizziness, numbness, headaches and memory loss.  Hematological:  Negative for bruising/bleeding tendency.  Psychiatric/Behavioral:  Negative for confusion.  PMFS History:  Patient Active Problem List   Diagnosis Date Noted   Macular pucker, left eye 06/18/2021   High risk medication use 05/30/2021   Bilateral shoulder pain 05/08/2021   Vitamin D deficiency 05/08/2021   Abnormal ANCA  test 05/08/2021   Rheumatoid arthritis ([place]) 04/22/2021   S/P thoracentesis    Arterial hypotension    Pleural effusion on left    Pericardial effusion    Leukocytosis 01/03/2021   Community acquired pneumonia 01/03/2021   Atrial fibrillation ([place]) 12/24/2020   Atrial fibrillation with RVR ([place]) 12/23/2020   Musculoskeletal pain 12/23/2020   Posterior vitreous detachment of both eyes 04/30/2020   Macular pucker, right eye 11/08/2019   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[place]) 11/08/2019   Intermediate stage nonexudative age-related macular degeneration of both eyes 11/08/2019   Anticoagulated 11/18/2016   Bradycardia 03/23/2014   OSA (obstructive sleep apnea) 01/19/2014   Obesity (BMI 30-39.9) 01/19/2014   Paroxysmal A-fib ([employer]) 10/04/2013   Nonspecific abnormal unspecified cardiovascular function study 07/21/2013   Acute gastroenteritis 07/15/2013    Past Medical History:  Diagnosis Date   A-fib [facility])    Allergy    Atrial fibrillation ([facility])    FH: cholecystectomy 1997   Macular degeneration    OSA (obstructive sleep apnea)    mild with total AHI 7.25/hr and severe during REM sleep at 35/hr now on CPAP at 6cm H2O    Family History  Problem Relation Age of Onset   Osteoporosis Mother    Macular degeneration Mother    Heart Problems Father    Stroke Father    Diabetes Brother    Kidney failure Brother    Cancer Maternal Grandmother    Dementia Paternal Grandmother    Suicidality Other    Heart attack Neg Hx    Past Surgical History:  Procedure Laterality Date   CHOLECYSTECTOMY     PERICARDIOCENTESIS N/A 01/07/2021   Procedure: PERICARDIOCENTESIS;  Surgeon: Nelva Bush, MD;  Location: [place] [facility];  Service: Cardiovascular;  Laterality: N/A;   REPLACEMENT TOTAL KNEE BILATERAL     THORACENTESIS N/A 01/25/2021   Procedure: THORACENTESIS;  Surgeon: Freddi Starr, MD;  Location: [facility] ENDOSCOPY;   Service: Pulmonary;  Laterality: N/A;   THORACENTESIS N/A 04/08/2021   Procedure: THORACENTESIS;  Surgeon: Freddi Starr, MD;  Location: [facility] ENDOSCOPY;  Service: Pulmonary;  Laterality: N/A;   THORACENTESIS N/A 04/24/2021   Procedure: THORACENTESIS;  Surgeon: Juanito Doom, MD;  Location: [place];  Service: Cardiopulmonary;  Laterality: N/A;   Social History   Social History Narrative   ** Merged History Encounter **       Immunization History  Administered Date(s) Administered   Influenza-Unspecified 01/24/2014   PFIZER(Purple Top)SARS-COV-2 Vaccination 06/16/2019, 07/07/2019     Objective: Vital Signs: BP 125/80 (BP Location: Left Arm, Patient Position: Sitting, Cuff Size: Large)    Pulse (!) 106    Ht 5\' 2"  (1.575 m)    Wt 177 lb (80.3 kg)    BMI 32.37 kg/m    Physical Exam Cardiovascular:     Rate and Rhythm: Normal rate and regular rhythm.  Pulmonary:     Effort: Pulmonary effort is normal.     Breath sounds: Normal breath sounds.  Musculoskeletal:     Right lower leg: Edema present.     Left lower leg: Edema present.  Skin:    General: Skin is warm and dry.     Comments: Few  bruises on forearm extensor surfaces  Neurological:     Mental Status: She is alert.     Musculoskeletal Exam:  Shoulders full ROM no tenderness or swelling Elbows full ROM no tenderness or swelling Wrists full ROM no tenderness or swelling Fingers full ROM no tenderness or swelling Knees full ROM no tenderness or swelling  Investigation: No additional findings.  Imaging: DG Chest 2 View  Result Date: 06/25/2021 CLINICAL DATA:  Pleural effusion. EXAM: CHEST - 2 VIEW COMPARISON:  Chest x-ray 05/22/2021.  Chest CT 04/29/2021. FINDINGS: Small left pleural effusion is unchanged. There is no new focal lung infiltrate or pneumothorax. The cardiac silhouette is enlarged unchanged. No acute fractures are seen. IMPRESSION: 1. Stable small left pleural effusion. 2. No new focal lung  infiltrate. Electronically Signed   By: Ronney Asters M.D.   On: 06/25/2021 23:35    Recent Labs: Lab Results  Component Value Date   WBC 6.2 07/09/2021   HGB 15.1 07/09/2021   PLT 237 07/09/2021   NA 136 07/09/2021   K 5.0 07/09/2021   CL 98 07/09/2021   CO2 30 07/09/2021   GLUCOSE 123 (H) 07/09/2021   BUN 23 07/09/2021   CREATININE 1.03 (H) 07/09/2021   BILITOT 0.9 07/09/2021   ALKPHOS 88 01/19/2021   AST 12 07/09/2021   ALT 17 07/09/2021   PROT 6.0 (L) 07/09/2021   ALBUMIN 2.8 (L) 01/19/2021   CALCIUM 9.6 07/09/2021   GFRAA 90 12/27/2018   QFTBGOLDPLUS Indeterminate (A) 01/21/2021    Speciality Comments: No specialty comments available.  Procedures:  No procedures performed Allergies: Erythromycin, Azithromycin, Condrolite [glucosamine-chondroitin-msm], Glucosamine, and Sulfamethoxazole-trimethoprim   Assessment / Plan:     Visit Diagnoses: Rheumatoid arthritis involving multiple sites with positive rheumatoid factor ([place]) - Plan: azaTHIOprine (IMURAN) 50 MG tablet Pericardial effusion Pleural effusion on left  Apparently rheumatoid effusions without articular involvement currently stable on 40 mg prednisone and 50 mg azathioprine. Planning to increase dose to 100 mg daily and start prednisone taper. She discussed tapering to 30 mg then down by 5 mg increments as next step which I agree sounds like a safe plan. Will follow up for repeat labs at increased dose in 4-6 weeks then aim to keep this dose unless some symptom return during prednisone reduction.  High risk medication use - Plan: CBC with Differential/Platelet, COMPLETE METABOLIC PANEL WITH GFR  Checking CBC and CMP for monitoring after new azathioprine start.   Orders: Orders Placed This Encounter  Procedures   CBC with Differential/Platelet   COMPLETE METABOLIC PANEL WITH GFR   Meds ordered this encounter  Medications   azaTHIOprine (IMURAN) 50 MG tablet    Sig: Take 2 tablets (100 mg total) by mouth  daily.    Dispense:  60 tablet    Refill:  1     Follow-Up Instructions: Return in about 6 weeks (around 08/20/2021) for RA/pulm AZA titration f/u 6wks.   Collier [place], MD  Note - This record has been created using [employer].  Chart creation errors have been sought, but may not always  have been located. Such creation errors do not reflect on  the standard of medical care.

## 2021-07-09 ENCOUNTER — Encounter: Payer: Self-pay | Admitting: Internal Medicine

## 2021-07-09 ENCOUNTER — Ambulatory Visit (INDEPENDENT_AMBULATORY_CARE_PROVIDER_SITE_OTHER): Payer: Medicare Other | Admitting: Internal Medicine

## 2021-07-09 ENCOUNTER — Other Ambulatory Visit: Payer: Self-pay

## 2021-07-09 VITALS — BP 125/80 | HR 106 | Ht 62.0 in | Wt 177.0 lb

## 2021-07-09 DIAGNOSIS — J90 Pleural effusion, not elsewhere classified: Secondary | ICD-10-CM

## 2021-07-09 DIAGNOSIS — M0579 Rheumatoid arthritis with rheumatoid factor of multiple sites without organ or systems involvement: Secondary | ICD-10-CM | POA: Diagnosis not present

## 2021-07-09 DIAGNOSIS — I3139 Other pericardial effusion (noninflammatory): Secondary | ICD-10-CM

## 2021-07-09 DIAGNOSIS — Z79899 Other long term (current) drug therapy: Secondary | ICD-10-CM | POA: Diagnosis not present

## 2021-07-09 MED ORDER — AZATHIOPRINE 50 MG PO TABS: 100.0000 mg | ORAL_TABLET | Freq: Every day | ORAL | 1 refills | Status: DC

## 2021-07-10 ENCOUNTER — Ambulatory Visit: Payer: Medicare Other | Admitting: Rheumatology

## 2021-07-10 LAB — CBC WITH DIFFERENTIAL/PLATELET
Absolute Monocytes: 217 cells/uL (ref 200–950)
Basophils Absolute: 12 cells/uL (ref 0–200)
Basophils Relative: 0.2 %
Eosinophils Absolute: 0 cells/uL — ABNORMAL LOW (ref 15–500)
Eosinophils Relative: 0 %
HCT: 44.7 % (ref 35.0–45.0)
Hemoglobin: 15.1 g/dL (ref 11.7–15.5)
Lymphs Abs: 397 cells/uL — ABNORMAL LOW (ref 850–3900)
MCH: 29.6 pg (ref 27.0–33.0)
MCHC: 33.8 g/dL (ref 32.0–36.0)
MCV: 87.6 fL (ref 80.0–100.0)
MPV: 11.4 fL (ref 7.5–12.5)
Monocytes Relative: 3.5 %
Neutro Abs: 5574 cells/uL (ref 1500–7800)
Neutrophils Relative %: 89.9 %
Platelets: 237 10*3/uL (ref 140–400)
RBC: 5.1 10*6/uL (ref 3.80–5.10)
RDW: 16.4 % — ABNORMAL HIGH (ref 11.0–15.0)
Total Lymphocyte: 6.4 %
WBC: 6.2 10*3/uL (ref 3.8–10.8)

## 2021-07-10 LAB — COMPLETE METABOLIC PANEL WITH GFR
AG Ratio: 1.7 (calc) (ref 1.0–2.5)
ALT: 17 U/L (ref 6–29)
AST: 12 U/L (ref 10–35)
Albumin: 3.8 g/dL (ref 3.6–5.1)
Alkaline phosphatase (APISO): 46 U/L (ref 37–153)
BUN/Creatinine Ratio: 22 (calc) (ref 6–22)
BUN: 23 mg/dL (ref 7–25)
CO2: 30 mmol/L (ref 20–32)
Calcium: 9.6 mg/dL (ref 8.6–10.4)
Chloride: 98 mmol/L (ref 98–110)
Creat: 1.03 mg/dL — ABNORMAL HIGH (ref 0.60–0.95)
Globulin: 2.2 g/dL (calc) (ref 1.9–3.7)
Glucose, Bld: 123 mg/dL — ABNORMAL HIGH (ref 65–99)
Potassium: 5 mmol/L (ref 3.5–5.3)
Sodium: 136 mmol/L (ref 135–146)
Total Bilirubin: 0.9 mg/dL (ref 0.2–1.2)
Total Protein: 6 g/dL — ABNORMAL LOW (ref 6.1–8.1)
eGFR: 55 mL/min/{1.73_m2} — ABNORMAL LOW (ref >=60)

## 2021-07-10 NOTE — Progress Notes (Signed)
Total white blood cell count is normal but lymphocyte count is low. This can be related to azathioprine. However, Rebecca Short also had equally low lymphocytes in August last year when not on this medication so I don't know  if it is related.   We can proceed with increasing the dose as planned-if her numbers go down more when we next check would suggest the medicine is related.

## 2021-07-11 ENCOUNTER — Other Ambulatory Visit (HOSPITAL_COMMUNITY): Payer: Self-pay

## 2021-07-11 NOTE — Telephone Encounter (Signed)
Received notification from [employer] regarding a prior authorization for ATOVAQUONE 750MG . Authorization has been APPROVED from 1.31.23 to 4.30.23.   Per test claim, copay for 30 days supply is $551.40

## 2021-07-22 ENCOUNTER — Other Ambulatory Visit: Payer: Self-pay | Admitting: Pulmonary Disease

## 2021-07-22 ENCOUNTER — Other Ambulatory Visit: Payer: Self-pay | Admitting: Internal Medicine

## 2021-07-22 DIAGNOSIS — M0579 Rheumatoid arthritis with rheumatoid factor of multiple sites without organ or systems involvement: Secondary | ICD-10-CM

## 2021-07-22 DIAGNOSIS — J90 Pleural effusion, not elsewhere classified: Secondary | ICD-10-CM

## 2021-07-23 ENCOUNTER — Encounter: Payer: Self-pay | Admitting: Pulmonary Disease

## 2021-07-23 ENCOUNTER — Ambulatory Visit (INDEPENDENT_AMBULATORY_CARE_PROVIDER_SITE_OTHER): Payer: Medicare Other | Admitting: Pulmonary Disease

## 2021-07-23 ENCOUNTER — Other Ambulatory Visit: Payer: Self-pay

## 2021-07-23 VITALS — BP 124/62 | HR 94 | Ht 62.0 in | Wt 175.0 lb

## 2021-07-23 DIAGNOSIS — J90 Pleural effusion, not elsewhere classified: Secondary | ICD-10-CM

## 2021-07-23 MED ORDER — PREDNISONE 10 MG PO TABS: 10.0000 mg | ORAL_TABLET | Freq: Every day | ORAL | 3 refills | Status: DC

## 2021-07-23 NOTE — Patient Instructions (Signed)
Prednisone Taper Plan: 30mg  2/16 - 3/16 25mg  3/17 - 4/14 20mg  4/15 - 5/13 15mg  5/14 - 6/11 10mg  6/12 - 7/10  5mg   7/11 - 8/8 2.5mg  8/9 - 8/23  We will continue atovaquone therapy until 5/13.   Follow up in 1 month

## 2021-07-23 NOTE — Progress Notes (Signed)
Synopsis: Referred in October 2022 for hospital follow up for pleural effusions  Subjective:   PATIENT ID: Rebecca Short GENDER: female DOB: [birth year], MRN: [id number]  HPI  Chief Complaint  Patient presents with   Follow-up    1 mo f/u. States she has been doing well since last visit.    Rebecca Short is an 81 year old woman, never smoker with history of obstructive sleep apnea and atrial fibrillation who returns to pulmonary clinic for follow up of serositis and chronic left pleural effusion.   She continues on imuran therapy 100mg  daily which has been increased from 50mg  daily by Dr. Benjamine Mola her rheumatologist, note from 07/09/21 reviewed. She has started her prednisone taper and is currently taking 30mg  daily. She remains on atovaquone prophylaxis. 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  She has done well since last visit. No increase in shortness of breath. She continues to progress with her physical activity.  OV 06/25/21 She has been doing well since last visit. She was started on imuran per Dr. Benjamine Mola and she has follow up with him on 07/09/21. She remains on 40mg  of prednisone daily. She is working with home PT with good results. She reports her daily activities are increasing as she is now able to vacuum 2 rooms in her home compared to 1 previously.   Chest radiograph today shows chronic left pleural effusion, stable from last month.  OV 05/22/21 She was evaluated by Dr. 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 Anti-smooth muscle anitbody is elevated. Cryoglobulins are negative.   Discussed pleural fluid studies further with Dr. Vic Ripper which showed mixed granulocytes and histioctyes along with some multinucleated giant cells. A definite diagnosis can't be placed on these findings, but he did raise concern for rheumatoid arthritis.   These upates were shared with the patient,  her husband and friend.   Chest x-ray today shows persistent left pleural effusion and clear right lung.   Patient is feeling much better after increasing her prednisone back to 40mg  daily. She remains on prophylactic antibiotics. Her shoulder pain has also resolved with the prednisone.   She continues to work with PT each week and feels her conditioning in improving.  OV 04/24/21 She was seen in acute visit on 11/28 by Derl Barrow, NP for increasing shortness of breath and pleuritic chest pain. She had tapered down to 5mg  of prednisone at this time. She had repeat thoracentesis on 04/08/21 based on CT chest from 04/22/21 which continued to show persistent left pleural effusion. Post-thoracentesis x-ray showed left pleural effusion and small right pleural effusion. Chest x-ray on 11/28 shows increased left pleural effusion.   Patient complains of increasing shortness of breath, bilateral shoulder pain and increasing left-sided discomfort.  Labs from visit on 11/16 showed rheumatoid factor I30 previously 57.2 on 01/21/2021.  P ANCA titer was 1:80 on 04/10/2021.  The following labs were negative on 04/10/2021: ANA, anti-CCP, dsDNA antibody and previously negative for SSA and SSB and antihistone antibodies.  OV 03/06/21 She was admitted 8/27 to 9/7 for pericardial effusion and bilateral pleural effusions. She is status post pericardial drain and multiple thoracenteses. The pleural fluid studies were consistent with exudative process which were initially neutrophil predominant that later became lymphocyte predominant. Cytology was negative for malignancy but showed reactive mesothelial cells.  She is being treated for serositis with prednisone taper and colchicine for the pericardial effusion. She has been on  40mg  of prednisone over the past month. She has been on pneumocystis prophylaxis with atovaquone, she developed skin rash with bactrim. Her inflammatory workup was rather unrevealing as ANA, CCP  anti-histone ab and SSA/SSB were negative. Her rheumatoid factor was elevated at 57.2.   She is feeling significantly better since discharge is resuming her normal activity and is quite satisfied with her recovering thus far.   Chest radiograph today shows opacities at the left lung base. The right lung is significantly improved since prior study with resolution of right pleural effusion.  Past Medical History:  Diagnosis Date   A-fib [facility])    Allergy    Atrial fibrillation (HCC)    FH: cholecystectomy 1997   Macular degeneration    OSA (obstructive sleep apnea)    mild with total AHI 7.25/hr and severe during REM sleep at 35/hr now on CPAP at 6cm H2O     Family History  Problem Relation Age of Onset   Osteoporosis Mother    Macular degeneration Mother    Heart Problems Father    Stroke Father    Diabetes Brother    Kidney failure Brother    Cancer Maternal Grandmother    Dementia Paternal Grandmother    Suicidality Other    Heart attack Neg Hx      Social History   Socioeconomic History   Marital status: Married    Spouse name: Not on file   Number of children: Not on file   Years of education: Not on file   Highest education level: Not on file  Occupational History   Not on file  Tobacco Use   Smoking status: Never   Smokeless tobacco: Never  Vaping Use   Vaping Use: Never used  Substance and Sexual Activity   Alcohol use: Never   Drug use: Never   Sexual activity: Not on file  Other Topics Concern   Not on file  Social History Narrative   ** Merged History Encounter **       Social Determinants of Health   Financial Resource Strain: Not on file  Food Insecurity: Not on file  Transportation Needs: Not on file  Physical Activity: Not on file  Stress: Not on file  Social Connections: Not on file  Intimate Partner Violence: Not on file     Allergies  Allergen Reactions   Erythromycin Other (See Comments)    Other reaction(s): burning    Azithromycin Other (See Comments)    Pain in arm with IV infusion    Condrolite [Glucosamine-Chondroitin-Msm] Rash   Glucosamine Rash    Other reaction(s): rash Other reaction(s): rash   Sulfamethoxazole-Trimethoprim Rash    Other reaction(s): rash Other reaction(s): rash     Outpatient Medications Prior to Visit  Medication Sig Dispense Refill   atovaquone (MEPRON) 750 MG/5ML suspension Take 750 mg by mouth 2 (two) times daily with a meal.     azaTHIOprine (IMURAN) 50 MG tablet Take 2 tablets (100 mg total) by mouth daily. 60 tablet 1   CALCIUM CITRATE-VITAMIN D PO Take 1 tablet by mouth at bedtime.     carboxymethylcellulose (REFRESH PLUS) 0.5 % SOLN Place 1 drop into both eyes 2 (two) times daily.     ELIQUIS 5 MG TABS tablet TAKE 1 TABLET BY MOUTH TWICE A DAY 60 tablet 6   EPIPEN 2-PAK 0.3 MG/0.3ML SOAJ injection Inject 0.3 mg into the muscle once as needed for anaphylaxis (severe allergic reaction).  0   furosemide (LASIX) 20 MG  tablet Take 1 tablet (20 mg total) by mouth daily. 90 tablet 1   metoprolol tartrate (LOPRESSOR) 25 MG tablet Take 0.5 tablets (12.5 mg total) by mouth daily. May increase to twice daily as tolerated. (Patient taking differently: Take 12.5 mg by mouth 2 (two) times daily. May increase to twice daily as tolerated.) 30 tablet 1   Multiple Vitamin (MULTIVITAMIN WITH MINERALS) TABS tablet Take 1 tablet by mouth every morning.     Multiple Vitamins-Minerals (PRESERVISION AREDS PO) Take 1 capsule by mouth 2 (two) times daily.     pantoprazole (PROTONIX) 40 MG tablet Take 1 tablet (40 mg total) by mouth daily at 12 noon. (Patient taking differently: Take 40 mg by mouth daily.) 90 tablet 2   potassium chloride (KLOR-CON) 20 MEQ packet Take 20 mEq by mouth daily. 90 each 2   predniSONE (DELTASONE) 20 MG tablet Take 2 tablets (40 mg total) by mouth daily with breakfast. (Patient taking differently: Take 30 mg by mouth daily with breakfast.) 60 tablet 5   PRESCRIPTION  MEDICATION every 14 (fourteen) days. Allergy shots administered by Dr. Donneta Romberg     acetaminophen (TYLENOL) 325 MG tablet Take 2 tablets (650 mg total) by mouth every 6 (six) hours as needed for moderate pain or mild pain. (Patient not taking: Reported on 07/09/2021)     No facility-administered medications prior to visit.    Review of Systems  Constitutional:  Negative for chills, fever, malaise/fatigue and weight loss.  HENT:  Negative for congestion, sinus pain and sore throat.   Eyes: Negative.   Respiratory:  Positive for shortness of breath. Negative for cough, hemoptysis, sputum production and wheezing.   Cardiovascular:  Negative for chest pain, palpitations, orthopnea, claudication and leg swelling.  Gastrointestinal:  Negative for abdominal pain, heartburn, nausea and vomiting.  Genitourinary: Negative.   Musculoskeletal:  Negative for joint pain and myalgias.  Skin:  Negative for rash.  Neurological:  Negative for weakness.  Endo/Heme/Allergies: Negative.   Psychiatric/Behavioral: Negative.     Objective:   Vitals:   07/23/21 1027  BP: 124/62  Pulse: 94  SpO2: 97%  Weight: 175 lb (79.4 kg)  Height: 5\' 2"  (1.575 m)    Physical Exam Constitutional:      General: She is not in acute distress.    Appearance: She is not ill-appearing.  HENT:     Head: Normocephalic and atraumatic.  Eyes:     Conjunctiva/sclera: Conjunctivae normal.  Cardiovascular:     Rate and Rhythm: Normal rate and regular rhythm.     Pulses: Normal pulses.     Heart sounds: Normal heart sounds. No murmur heard. Pulmonary:     Effort: Pulmonary effort is normal.     Breath sounds: Examination of the left-middle field reveals decreased breath sounds. Examination of the left-lower field reveals decreased breath sounds. Decreased breath sounds present. No wheezing, rhonchi or rales.  Musculoskeletal:     Right lower leg: No edema.     Left lower leg: No edema.  Skin:    General: Skin is warm and  dry.  Neurological:     General: No focal deficit present.     Mental Status: She is alert.  Psychiatric:        Mood and Affect: Mood normal.        Behavior: Behavior normal.        Thought Content: Thought content normal.        Judgment: Judgment normal.   CBC    Component  Value Date/Time   WBC 6.2 07/09/2021 1524   RBC 5.10 07/09/2021 1524   HGB 15.1 07/09/2021 1524   HGB 15.7 12/27/2018 0831   HCT 44.7 07/09/2021 1524   HCT 47.4 (H) 12/27/2018 0831   PLT 237 07/09/2021 1524   PLT 213 12/27/2018 0831   MCV 87.6 07/09/2021 1524   MCV 90 12/27/2018 0831   MCH 29.6 07/09/2021 1524   MCHC 33.8 07/09/2021 1524   RDW 16.4 (H) 07/09/2021 1524   RDW 13.1 12/27/2018 0831   LYMPHSABS 397 (L) 07/09/2021 1524   MONOABS 1.0 01/19/2021 1803   EOSABS 0 (L) 07/09/2021 1524   BASOSABS 12 07/09/2021 1524   BMP Latest Ref Rng & Units 07/09/2021 02/06/2021 01/30/2021  Glucose 65 - 99 mg/dL 123(H) 178(H) 124(H)  BUN 7 - 25 mg/dL 23 27 26(H)  Creatinine 0.60 - 0.95 mg/dL 1.03(H) 0.84 0.86  BUN/Creat Ratio 6 - 22 (calc) 22 32(H) -  Sodium 135 - 146 mmol/L 136 139 135  Potassium 3.5 - 5.3 mmol/L 5.0 4.6 4.1  Chloride 98 - 110 mmol/L 98 98 101  CO2 20 - 32 mmol/L 30 25 28   Calcium 8.6 - 10.4 mg/dL 9.6 8.8 8.5(L)   Chest imaging: CXR 06/25/21 1. Stable small left pleural effusion. 2. No new focal lung infiltrate.  CXR 05/22/21 There is a small to medium size left pleural effusion, not significantly changed since 04/24/2021. Aeration of the left lung is also not significantly changed. The previously seen small right effusion has resolved. The right lung is now clear. There is no pneumothorax.  CXR 04/22/21 Increasing LEFT effusion and associated airspace disease. Correlate with any signs of infection. Given persistence/worsening underlying pulmonary or bronchial lesion would be difficult to exclude. Correlate with results of prior thoracentesis and consider follow-up chest CT for  further evaluation as warranted.   Trace RIGHT effusion with basilar atelectasis.  CXR 03/06/21 Resolved right-sided pleural effusion with persistent opacity on the left, likely a combination of persisting pleural fluid and associated atelectasis/consolidation  CXR 01/30/21 1. Slight interval decrease in size of the right pleural effusion with improved aeration of the right base. 2. No significant interval change of the larger left pleural effusion and adjacent airspace disease.  CT Chest 01/03/21 Moderate left and small right pleural effusions, which is increased on the left and new on the right in comparison to prior CT. Adjacent bibasilar and lingular atelectasis.   Increased, moderate-sized pericardial effusion. Biatrial enlargement.   Mild pulmonary edema.  PFT: No flowsheet data found.  Labs: 01/21/21: Quantiferon gold indeterminate  Path:  Echo 01/20/21:  1. Left ventricular ejection fraction, by estimation, is 60 to 65%. The  left ventricle has normal function. The left ventricle has no regional  wall motion abnormalities.   2. A small pericardial effusion is present. There is no evidence of  cardiac tamponade.  Heart Catheterization:   Assessment & Plan:   Pleural effusion on left - Plan: predniSONE (DELTASONE) 10 MG tablet  Discussion: Khou Tureaud is an 81 year old woman, never smoker with history of obstructive sleep apnea and atrial fibrillation who returns to pulmonary clinic for follow up of serositis and left pleural effusion.  She continues to improve physically. The pleural effusion remains  stable based on prior x-rays and is now likely loculated.    She is to continue on prednisone 30mg  daily and prophylactic atovaquone until below 20mg  daily dosing of prendisone. She is on atovaquone as she is allergic to bactrim due  to development of rash.   Below is the proposed steroid taper: 30mg  2/16 - 3/16 25mg  3/17 - 4/14 20mg  4/15 - 5/13 15mg  5/14 -  6/11 10mg  6/12 - 7/10  5mg   7/11 - 8/8 2.5mg  8/9 - 8/23  Follow-up in 4 weeks.  Freda Jackson, MD [facility] Office: [phone]   Current Outpatient Medications:    atovaquone (MEPRON) 750 MG/5ML suspension, Take 750 mg by mouth 2 (two) times daily with a meal., Disp: , Rfl:    azaTHIOprine (IMURAN) 50 MG tablet, Take 2 tablets (100 mg total) by mouth daily., Disp: 60 tablet, Rfl: 1   CALCIUM CITRATE-VITAMIN D PO, Take 1 tablet by mouth at bedtime., Disp: , Rfl:    carboxymethylcellulose (REFRESH PLUS) 0.5 % SOLN, Place 1 drop into both eyes 2 (two) times daily., Disp: , Rfl:    ELIQUIS 5 MG TABS tablet, TAKE 1 TABLET BY MOUTH TWICE A DAY, Disp: 60 tablet, Rfl: 6   EPIPEN 2-PAK 0.3 MG/0.3ML SOAJ injection, Inject 0.3 mg into the muscle once as needed for anaphylaxis (severe allergic reaction)., Disp: , Rfl: 0   furosemide (LASIX) 20 MG tablet, Take 1 tablet (20 mg total) by mouth daily., Disp: 90 tablet, Rfl: 1   metoprolol tartrate (LOPRESSOR) 25 MG tablet, Take 0.5 tablets (12.5 mg total) by mouth daily. May increase to twice daily as tolerated. (Patient taking differently: Take 12.5 mg by mouth 2 (two) times daily. May increase to twice daily as tolerated.), Disp: 30 tablet, Rfl: 1   Multiple Vitamin (MULTIVITAMIN WITH MINERALS) TABS tablet, Take 1 tablet by mouth every morning., Disp: , Rfl:    Multiple Vitamins-Minerals (PRESERVISION AREDS PO), Take 1 capsule by mouth 2 (two) times daily., Disp: , Rfl:    pantoprazole (PROTONIX) 40 MG tablet, Take 1 tablet (40 mg total) by mouth daily at 12 noon. (Patient taking differently: Take 40 mg by mouth daily.), Disp: 90 tablet, Rfl: 2   potassium chloride (KLOR-CON) 20 MEQ packet, Take 20 mEq by mouth daily., Disp: 90 each, Rfl: 2   predniSONE (DELTASONE) 10 MG tablet, Take 1 tablet (10 mg total) by mouth daily with breakfast., Disp: 30 tablet, Rfl: 3   predniSONE (DELTASONE) 20 MG tablet, Take 2 tablets (40 mg  total) by mouth daily with breakfast. (Patient taking differently: Take 30 mg by mouth daily with breakfast.), Disp: 60 tablet, Rfl: 5   PRESCRIPTION MEDICATION, every 14 (fourteen) days. Allergy shots administered by Dr. Donneta Romberg, Disp: , Rfl:

## 2021-07-26 ENCOUNTER — Telehealth: Payer: Self-pay | Admitting: Internal Medicine

## 2021-07-26 NOTE — Telephone Encounter (Signed)
Patient called the office stating she woke up this morning with swollen ankles and she is concerned. Patient states she checks those kinds of things everyday and today she is worried. Patient states she didn't have any excessive salt yesterday or anything that might explain why her ankles are swollen. Patient would like to know if she needs to get labs to check her kidneys and requests an urgent response. Patient is very worried. ?

## 2021-07-26 NOTE — Telephone Encounter (Signed)
I spoke with Rebecca Short she has increased bilateral ankle swelling last night and this morning. She is very anxious due to her previous relapses with medication tapering leading to hospitalizations. Her weight is stable daily variations less than 0.5 lbs all week. No increased respiratory symptoms or swelling elsewhere. ?I recommended she just monitor this for right now her risk of severe fluid accumulation over the weekend appears small. If the swelling does not get better by Monday she could come to have metabolic panel rechecked to rule out a kidney problems form change in medication as a cause, or call us back to ask again.

## 2021-07-29 ENCOUNTER — Telehealth: Payer: Self-pay

## 2021-07-29 NOTE — Telephone Encounter (Signed)
Patient called to let Dr. Dimple Casey know that her swelling went down and is feeling much better.  Patient states her weight is stable and she is not concerned at this point.   ? ?Patient requested a return call to let her know if labwork is needed before her appointment on 09/09/21.   ?

## 2021-07-29 NOTE — Telephone Encounter (Signed)
I called patient, patient verbalized understanding. 

## 2021-07-29 NOTE — Telephone Encounter (Signed)
Glad symptoms were improving. No, we can check for any needed labs at the visit unless she has new problems in the meantime.

## 2021-08-01 ENCOUNTER — Telehealth: Payer: Self-pay | Admitting: Pulmonary Disease

## 2021-08-01 ENCOUNTER — Other Ambulatory Visit: Payer: Self-pay | Admitting: Internal Medicine

## 2021-08-01 DIAGNOSIS — M0579 Rheumatoid arthritis with rheumatoid factor of multiple sites without organ or systems involvement: Secondary | ICD-10-CM

## 2021-08-06 NOTE — Telephone Encounter (Signed)
Received fax from [facility]. Will fax to the PA team for completion.  ?

## 2021-08-20 ENCOUNTER — Other Ambulatory Visit: Payer: Self-pay

## 2021-08-20 ENCOUNTER — Ambulatory Visit (INDEPENDENT_AMBULATORY_CARE_PROVIDER_SITE_OTHER): Payer: Medicare Other | Admitting: Pulmonary Disease

## 2021-08-20 ENCOUNTER — Encounter: Payer: Self-pay | Admitting: Pulmonary Disease

## 2021-08-20 ENCOUNTER — Ambulatory Visit (INDEPENDENT_AMBULATORY_CARE_PROVIDER_SITE_OTHER): Payer: Medicare Other

## 2021-08-20 VITALS — BP 118/74 | HR 87 | Ht 62.0 in | Wt 171.4 lb

## 2021-08-20 DIAGNOSIS — K658 Other peritonitis: Secondary | ICD-10-CM | POA: Diagnosis not present

## 2021-08-20 DIAGNOSIS — J90 Pleural effusion, not elsewhere classified: Secondary | ICD-10-CM | POA: Diagnosis not present

## 2021-08-20 NOTE — Progress Notes (Signed)
? ?Synopsis: Referred in October 2022 for hospital follow up for pleural effusions ? ?Subjective:  ? ?PATIENT ID: Rebecca Short GENDER: female DOB: [birth year], MRN: [id number] ? ?HPI ? ?Chief Complaint  ?Patient presents with  ? Follow-up  ?  1 mo f/u. States she has been doing well since last visit. She's able to do more around the house without DOE.   ? ?Rebecca Short is an 81 year old woman, never smoker with history of obstructive sleep apnea and atrial fibrillation who returns to pulmonary clinic for follow up of serositis and chronic left pleural effusion.  ? ?She continues to do well since last visit and her stamina is increasing and her balance is getting better. She is getting out to walk in the local parks. She is currently taking 25mg  prednisone daily and atovaquone daily.  ? ?OV 07/23/21 ?She continues on imuran therapy 100mg  daily which has been increased from 50mg  daily by Dr. Dimple Casey her rheumatologist, note from 07/09/21 reviewed. She has started her prednisone taper and is currently taking 30mg  daily. She remains on atovaquone prophylaxis. 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 ? ?She has done well since last visit. No increase in shortness of breath. She continues to progress with her physical activity. ? ?OV 06/25/21 ?She has been doing well since last visit. She was started on imuran per Dr. Dimple Casey and she has follow up with him on 07/09/21. She remains on 40mg  of prednisone daily. She is working with home PT with good results. She reports her daily activities are increasing as she is now able to vacuum 2 rooms in her home compared to 1 previously.  ? ?Chest radiograph today shows chronic left pleural effusion, stable from last month. ? ?OV 05/22/21 ?She was evaluated by Dr. 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 Anti-smooth muscle anitbody is elevated. Cryoglobulins are negative.  ? ?Discussed  pleural fluid studies further with Dr. Kenard Gower which showed mixed granulocytes and histioctyes along with some multinucleated giant cells. A definite diagnosis can't be placed on these findings, but he did raise concern for rheumatoid arthritis.  ? ?These upates were shared with the patient, her husband and friend.  ? ?Chest x-ray today shows persistent left pleural effusion and clear right lung.  ? ?Patient is feeling much better after increasing her prednisone back to 40mg  daily. She remains on prophylactic antibiotics. Her shoulder pain has also resolved with the prednisone.  ? ?She continues to work with PT each week and feels her conditioning in improving. ? ?OV 04/24/21 ?She was seen in acute visit on 11/28 by [name], NP for increasing shortness of breath and pleuritic chest pain. She had tapered down to 5mg  of prednisone at this time. She had repeat thoracentesis on 04/08/21 based on CT chest from 04/22/21 which continued to show persistent left pleural effusion. Post-thoracentesis x-ray showed left pleural effusion and small right pleural effusion. Chest x-ray on 11/28 shows increased left pleural effusion.  ? ?Patient complains of increasing shortness of breath, bilateral shoulder pain and increasing left-sided discomfort. ? ?Labs from visit on 11/16 showed rheumatoid factor I30 previously 57.2 on 01/21/2021.  P ANCA titer was 1:80 on 04/10/2021. ? ?The following labs were negative on 04/10/2021: ANA, anti-CCP, dsDNA antibody and previously negative for SSA and SSB and antihistone antibodies. ? ?OV 03/06/21 ?She was admitted 8/27 to 9/7 for pericardial effusion and bilateral pleural effusions. She is status post pericardial  drain and multiple thoracenteses. The pleural fluid studies were consistent with exudative process which were initially neutrophil predominant that later became lymphocyte predominant. Cytology was negative for malignancy but showed reactive mesothelial cells. ? ?She is being treated  for serositis with prednisone taper and colchicine for the pericardial effusion. She has been on 40mg  of prednisone over the past month. She has been on pneumocystis prophylaxis with atovaquone, she developed skin rash with bactrim. Her inflammatory workup was rather unrevealing as ANA, CCP anti-histone ab and SSA/SSB were negative. Her rheumatoid factor was elevated at 57.2.  ? ?She is feeling significantly better since discharge is resuming her normal activity and is quite satisfied with her recovering thus far.  ? ?Chest radiograph today shows opacities at the left lung base. The right lung is significantly improved since prior study with resolution of right pleural effusion. ? ?Past Medical History:  ?Diagnosis Date  ? A-fib (HCC)   ? Allergy   ? Atrial fibrillation (HCC)   ? FH: cholecystectomy 1997  ? Macular degeneration   ? OSA (obstructive sleep apnea)   ? mild with total AHI 7.25/hr and severe during REM sleep at 35/hr now on CPAP at 6cm H2O  ?  ? ?Family History  ?Problem Relation Age of Onset  ? Osteoporosis Mother   ? Macular degeneration Mother   ? Heart Problems Father   ? Stroke Father   ? Diabetes Brother   ? Kidney failure Brother   ? Cancer Maternal Grandmother   ? Dementia Paternal Grandmother   ? Suicidality Other   ? Heart attack Neg Hx   ?  ? ?Social History  ? ?Socioeconomic History  ? Marital status: Married  ?  Spouse name: Not on file  ? Number of children: Not on file  ? Years of education: Not on file  ? Highest education level: Not on file  ?Occupational History  ? Not on file  ?Tobacco Use  ? Smoking status: Never  ? Smokeless tobacco: Never  ?Vaping Use  ? Vaping Use: Never used  ?Substance and Sexual Activity  ? Alcohol use: Never  ? Drug use: Never  ? Sexual activity: Not on file  ?Other Topics Concern  ? Not on file  ?Social History Narrative  ? ** Merged History Encounter **  ?    ? ?Social Determinants of Health  ? ?Financial Resource Strain: Not on file  ?Food Insecurity: Not on  file  ?Transportation Needs: Not on file  ?Physical Activity: Not on file  ?Stress: Not on file  ?Social Connections: Not on file  ?Intimate Partner Violence: Not on file  ?  ? ?Allergies  ?Allergen Reactions  ? Erythromycin Other (See Comments)  ?  Other reaction(s): burning  ? Azithromycin Other (See Comments)  ?  Pain in arm with IV infusion ?  ? Condrolite [Glucosamine-Chondroitin-Msm] Rash  ? Glucosamine Rash  ?  Other reaction(s): rash ?Other reaction(s): rash  ? Sulfamethoxazole-Trimethoprim Rash  ?  Other reaction(s): rash ?Other reaction(s): rash  ?  ? ?Outpatient Medications Prior to Visit  ?Medication Sig Dispense Refill  ? atovaquone (MEPRON) 750 MG/5ML suspension Take 750 mg by mouth 2 (two) times daily with a meal.    ? azaTHIOprine (IMURAN) 50 MG tablet TAKE 2 TABLETS BY MOUTH EVERY DAY 180 tablet 1  ? CALCIUM CITRATE-VITAMIN D PO Take 1 tablet by mouth at bedtime.    ? carboxymethylcellulose (REFRESH PLUS) 0.5 % SOLN Place 1 drop into both eyes 2 (two) times  daily.    ? ELIQUIS 5 MG TABS tablet TAKE 1 TABLET BY MOUTH TWICE A DAY 60 tablet 6  ? EPIPEN 2-PAK 0.3 MG/0.3ML SOAJ injection Inject 0.3 mg into the muscle once as needed for anaphylaxis (severe allergic reaction).  0  ? metoprolol tartrate (LOPRESSOR) 25 MG tablet Take 0.5 tablets (12.5 mg total) by mouth daily. May increase to twice daily as tolerated. (Patient taking differently: Take 12.5 mg by mouth 2 (two) times daily.) 30 tablet 1  ? Multiple Vitamin (MULTIVITAMIN WITH MINERALS) TABS tablet Take 1 tablet by mouth every morning.    ? Multiple Vitamins-Minerals (PRESERVISION AREDS PO) Take 1 capsule by mouth 2 (two) times daily.    ? pantoprazole (PROTONIX) 40 MG tablet Take 1 tablet (40 mg total) by mouth daily at 12 noon. (Patient taking differently: Take 40 mg by mouth daily.) 90 tablet 2  ? potassium chloride (KLOR-CON) 20 MEQ packet Take 20 mEq by mouth daily. 90 each 2  ? predniSONE (DELTASONE) 10 MG tablet Take 1 tablet (10 mg  total) by mouth daily with breakfast. 30 tablet 3  ? predniSONE (DELTASONE) 20 MG tablet Take 2 tablets (40 mg total) by mouth daily with breakfast. (Patient taking differently: Take 30 mg by mouth daily wi

## 2021-08-20 NOTE — Patient Instructions (Signed)
Continue steroid taper as planned: ?25mg  3/17 - 4/14 ?20mg  4/15 - 5/13 ?15mg  5/14 - 6/11 ?10mg  6/12 - 7/10 ? 5mg   7/11 - 8/8 ?2.5mg  8/9 - 8/23 ? ?Continue atovaquone daily ? ?We will check chest x-ray today ? ?Follow up in 1 month ?

## 2021-08-22 ENCOUNTER — Telehealth: Payer: Self-pay | Admitting: Interventional Cardiology

## 2021-08-22 MED ORDER — FUROSEMIDE 20 MG PO TABS: 20.0000 mg | ORAL_TABLET | Freq: Every day | ORAL | 1 refills | Status: DC

## 2021-08-22 NOTE — Telephone Encounter (Signed)
Patient called and wanted to know if Dr. Irish Lack wants her to contiune to take the furosemide (LASIX) 20 MG tablet. Patient wants to know if Dr. Irish Lack discontiues the medication, could she discontinue taking the potassium chloride (KLOR-CON) 20 MEQ packet ?

## 2021-08-22 NOTE — Telephone Encounter (Signed)
Spoke with pt to inquire why she thought Furosemide might be stopped or if someone else had told her to stop  it.  Pt states she is out of refills so she was checking to see if she still needed to take it.  Explained to pt that she should still be taking this and I will get refills sent to the pharmacy.  Pt appreciative for call.  ?

## 2021-08-28 ENCOUNTER — Ambulatory Visit: Payer: Medicare Other | Admitting: Internal Medicine

## 2021-09-03 ENCOUNTER — Encounter: Payer: Self-pay | Admitting: Pulmonary Disease

## 2021-09-05 ENCOUNTER — Telehealth: Payer: Self-pay | Admitting: Pulmonary Disease

## 2021-09-05 ENCOUNTER — Other Ambulatory Visit: Payer: Self-pay | Admitting: Pulmonary Disease

## 2021-09-05 DIAGNOSIS — J90 Pleural effusion, not elsewhere classified: Secondary | ICD-10-CM

## 2021-09-05 DIAGNOSIS — Z792 Long term (current) use of antibiotics: Secondary | ICD-10-CM

## 2021-09-05 MED ORDER — ATOVAQUONE 750 MG/5ML PO SUSP: 750.0000 mg | Freq: Two times a day (BID) | ORAL | 1 refills | Status: DC

## 2021-09-05 NOTE — Telephone Encounter (Signed)
Called patient and she states that she needs a refill on her Atovequone. I looked at Dr Cindi Carbon notes and was unsure if he wanted patient on this medication long term. So I told patient that I would message Dr Francine Graven and get clarification from him to refill this medication or not.  ? ?Dr Francine Graven please advise  ?

## 2021-09-05 NOTE — Telephone Encounter (Signed)
Yes, refill sent. She should be on this medication through middle of next month as outlined in her instructions. ? ?Thanks, ? ?JD ?

## 2021-09-05 NOTE — Telephone Encounter (Signed)
Called patient and informed her that her refill was sent in by Dr Erin Fulling. Patient voiced understanding with no questions noted. Nothing further needed  ?

## 2021-09-08 NOTE — Progress Notes (Signed)
? ?Office Visit Note ? ?Patient: Rebecca Short             ?Date of Birth: 10-20-1940           ?MRN: [id number]             ?PCP: Lawerance Cruel, MD ?Referring: Lawerance Cruel, MD ?Visit Date: 09/09/2021 ? ? ?Subjective:  ?Follow-up (Doing good) ? ? ?History of Present Illness: Rebecca Short is a 81 y.o. 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 She is doing well since last visit without major events. She has some mild GI irritation with the azathioprine. She has significant bruising on her hands and forearms. She saw Dr. Erin Fulling for follow up with stable pulmonary symptoms and repeat chest xray obtained 3/28 was also stable. She is tapering the prednisone of a rate about 5 mg/day dose per month currently down to 20 mg daily. She is curious about safety going to the dentist for procedures on her medications. ? ? ?Previous HPI ?07/09/21 ?Rebecca Short is a 81 y.o. female here for follow up for rheumatoid pleural effusions after starting azathioprine 50 mg last month, continuing prednisone 40 mg daily and prophylactic atovaquone. She went for repeat chest xray with Dr. Erin Fulling that looked stable 2 weeks ago. So far she denies any major side effects after starting imuran. ?  ?Previous HPI ?05/08/21 ?Rebecca Short is a 81 y.o. female here for evaluation with recurrent pleural effusions and dyspnea with positive RF and ANCA Abs. She was hospitalized in August after progressively worsening dyspnea and hypotension with exudative pleural fluid removed from left lung space followed by pericardial drainage and initial treatment with colchicine. She subsequently underwent additional thoracentesis drainage of effusions later in August and beginning of September. Results showed neutrophil predominant fluid. Prednisone 40 mg daily was added to treatment with stable condition. This continued for a month but tapering the prednisone resulted  in more rapid re accumulation of fluid on a dose of about 20 gm daily or less. Imaging demonstrated progressive increase again in left sided effusions and repeat drainage again on 11/14 and 11/30 when down to 10 mg and 5 mg daily doses of prednisone along with colchicine. She is now continuing prednisone 40 mg with atovaquone prophylaxis and colchicine 0.6 mg daily. ?In addition to pulmonary symptoms she reports bilateral shoulder pain and stiffness developing around the initial onset of her dyspnea before hospitalization in August.  This improved completely on 40 mg prednisone but she noticed definite recurrence of shoulder pain symptoms when tapering down the prednisone to 10 or 5 mg/day dose.  No active complaints today since she is back on higher dose medication.  She denies any peripheral joint pain involvement did not notice any significant swelling or numbness in her upper extremities associated with the pain. ? ? ?Review of Systems  ?Constitutional:  Negative for fatigue.  ?HENT:  Negative for mouth dryness.   ?Eyes:  Positive for dryness.  ?Respiratory:  Negative for shortness of breath.   ?Cardiovascular:  Positive for swelling in legs/feet.  ?Gastrointestinal:  Negative for constipation.  ?Endocrine: Negative for excessive thirst.  ?Genitourinary:  Negative for difficulty urinating.  ?Musculoskeletal:  Negative for morning stiffness.  ?Skin:  Positive for redness.  ?Allergic/Immunologic: Negative for susceptible to infections.  ?Neurological:  Negative for numbness.  ?Hematological:  Positive for bruising/bleeding tendency.  ?Psychiatric/Behavioral:  Negative for sleep disturbance.   ? ?  PMFS History:  ?Patient Active Problem List  ? Diagnosis Date Noted  ? Macular pucker, left eye 06/18/2021  ? High risk medication use 05/30/2021  ? Bilateral shoulder pain 05/08/2021  ? Vitamin D deficiency 05/08/2021  ? Abnormal ANCA test 05/08/2021  ? Rheumatoid arthritis ([place]) 04/22/2021  ? S/P thoracentesis   ?  Arterial hypotension   ? Pleural effusion on left   ? Pericardial effusion   ? Leukocytosis 01/03/2021  ? Community acquired pneumonia 01/03/2021  ? Atrial fibrillation ([place]) 12/24/2020  ? Atrial fibrillation with RVR ([place]) 12/23/2020  ? Musculoskeletal pain 12/23/2020  ? Posterior vitreous detachment of both eyes 04/30/2020  ? Macular pucker, right eye 11/08/2019  ? 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 Intermediate stage nonexudative age-related macular degeneration of both eyes 11/08/2019  ? Anticoagulated 11/18/2016  ? Bradycardia 03/23/2014  ? OSA (obstructive sleep apnea) 01/19/2014  ? Obesity (BMI 30-39.9) 01/19/2014  ? Paroxysmal A-fib ([place]) 10/04/2013  ? Nonspecific abnormal unspecified cardiovascular function study 07/21/2013  ? Acute gastroenteritis 07/15/2013  ?  ?Past Medical History:  ?Diagnosis Date  ? A-fib ([place])   ? Allergy   ? Atrial fibrillation ([place])   ? FH: cholecystectomy 1997  ? Macular degeneration   ? OSA (obstructive sleep apnea)   ? mild with total AHI 7.25/hr and severe during REM sleep at 35/hr now on CPAP at 6cm H2O  ?  ?Family History  ?Problem Relation Age of Onset  ? Osteoporosis Mother   ? Macular degeneration Mother   ? Heart Problems Father   ? Stroke Father   ? Diabetes Brother   ? Kidney failure Brother   ? Cancer Maternal Grandmother   ? Dementia Paternal Grandmother   ? Suicidality Other   ? Heart attack Neg Hx   ? ?Past Surgical History:  ?Procedure Laterality Date  ? CHOLECYSTECTOMY    ? PERICARDIOCENTESIS N/A 01/07/2021  ? Procedure: PERICARDIOCENTESIS;  Surgeon: Nelva Bush, MD;  Location: [facility];  Service: Cardiovascular;  Laterality: N/A;  ? REPLACEMENT TOTAL KNEE BILATERAL    ? THORACENTESIS N/A 01/25/2021  ? Procedure: THORACENTESIS;  Surgeon: Freddi Starr, MD;  Location: [facility] ENDOSCOPY;  Service: Pulmonary;  Laterality: N/A;  ? THORACENTESIS N/A 04/08/2021  ? Procedure:  THORACENTESIS;  Surgeon: Freddi Starr, MD;  Location: [facility] ENDOSCOPY;  Service: Pulmonary;  Laterality: N/A;  ? THORACENTESIS N/A 04/24/2021  ? Procedure: THORACENTESIS;  Surgeon: Juanito Doom, MD;  Location: [place];  Service: Cardiopulmonary;  Laterality: N/A;  ? ?Social History  ? ?Social History Narrative  ? ** Merged History Encounter **  ?    ? ?Immunization History  ?Administered Date(s) Administered  ? Influenza-Unspecified 01/24/2014  ? PFIZER(Purple Top)SARS-COV-2 Vaccination 06/16/2019, 07/07/2019  ?  ? ?Objective: ?Vital Signs: BP 121/72 (BP Location: Left Arm, Patient Position: Sitting, Cuff Size: Normal)   Pulse 76   Resp 17   Ht 5\' 2"  (1.575 m)   Wt 167 lb (75.8 kg)   BMI 30.54 kg/m?   ? ?Physical Exam ?Cardiovascular:  ?   Rate and Rhythm: Normal rate and regular rhythm.  ?Pulmonary:  ?   Effort: Pulmonary effort is normal.  ?   Comments: Left lower lung field breath sounds diminished ?Skin: ?   General: Skin is warm and dry.  ?   Comments: Numerous bruises on hands and forearms worse at right thumb and wrist  ?Neurological:  ?   Mental Status:  She is alert.  ?Psychiatric:     ?   Mood and Affect: Mood normal.  ?  ? ?Musculoskeletal Exam:  ?Wrists full ROM no tenderness or swelling ?Fingers full ROM no tenderness or swelling ?Knees full ROM no tenderness or swelling ? ? ?Investigation: ?No additional findings. ? ?Imaging: ?DG Chest 2 View ? ?Result Date: 08/21/2021 ?CLINICAL DATA:  81 year old female with a history of left-sided pleural effusion EXAM: CHEST - 2 VIEW COMPARISON:  06/25/2021 FINDINGS: Cardiomediastinal silhouette unchanged in size and contour. Left heart border obscured by opacity at the left lung base with meniscus. Blunting of the costophrenic angle and the sulcus on the lateral view with meniscus. No pneumothorax.  No airspace disease of the right lung. IMPRESSION: Similar appearance of the chest x-ray with left-sided pleural effusion and associated  atelectasis/consolidation Electronically Signed   By: Corrie Mckusick D.O.   On: 08/21/2021 09:36   ? ?Recent Labs: ?Lab Results  ?Component Value Date  ? WBC 6.7 09/09/2021  ? HGB 15.2 09/09/2021  ? PLT 246 09/09/2021  ? NA 136 02

## 2021-09-09 ENCOUNTER — Encounter: Payer: Self-pay | Admitting: Internal Medicine

## 2021-09-09 ENCOUNTER — Ambulatory Visit (INDEPENDENT_AMBULATORY_CARE_PROVIDER_SITE_OTHER): Payer: Medicare Other | Admitting: Internal Medicine

## 2021-09-09 VITALS — BP 121/72 | HR 76 | Resp 17 | Ht 62.0 in | Wt 167.0 lb

## 2021-09-09 DIAGNOSIS — M0579 Rheumatoid arthritis with rheumatoid factor of multiple sites without organ or systems involvement: Secondary | ICD-10-CM

## 2021-09-09 DIAGNOSIS — J90 Pleural effusion, not elsewhere classified: Secondary | ICD-10-CM

## 2021-09-09 DIAGNOSIS — Z79899 Other long term (current) drug therapy: Secondary | ICD-10-CM

## 2021-09-10 LAB — CBC WITH DIFFERENTIAL/PLATELET
Absolute Monocytes: 281 cells/uL (ref 200–950)
Basophils Absolute: 20 cells/uL (ref 0–200)
Basophils Relative: 0.3 %
Eosinophils Absolute: 0 cells/uL — ABNORMAL LOW (ref 15–500)
Eosinophils Relative: 0 %
HCT: 43.8 % (ref 35.0–45.0)
Hemoglobin: 15.2 g/dL (ref 11.7–15.5)
Lymphs Abs: 449 cells/uL — ABNORMAL LOW (ref 850–3900)
MCH: 33.2 pg — ABNORMAL HIGH (ref 27.0–33.0)
MCHC: 34.7 g/dL (ref 32.0–36.0)
MCV: 95.6 fL (ref 80.0–100.0)
MPV: 11.5 fL (ref 7.5–12.5)
Monocytes Relative: 4.2 %
Neutro Abs: 5950 cells/uL (ref 1500–7800)
Neutrophils Relative %: 88.8 %
Platelets: 246 10*3/uL (ref 140–400)
RBC: 4.58 10*6/uL (ref 3.80–5.10)
RDW: 17.1 % — ABNORMAL HIGH (ref 11.0–15.0)
Total Lymphocyte: 6.7 %
WBC: 6.7 10*3/uL (ref 3.8–10.8)

## 2021-09-10 LAB — COMPLETE METABOLIC PANEL WITH GFR
AG Ratio: 2.1 (calc) (ref 1.0–2.5)
ALT: 14 U/L (ref 6–29)
AST: 15 U/L (ref 10–35)
Albumin: 4 g/dL (ref 3.6–5.1)
Alkaline phosphatase (APISO): 43 U/L (ref 37–153)
BUN/Creatinine Ratio: 19 (calc) (ref 6–22)
BUN: 18 mg/dL (ref 7–25)
CO2: 29 mmol/L (ref 20–32)
Calcium: 9.5 mg/dL (ref 8.6–10.4)
Chloride: 97 mmol/L — ABNORMAL LOW (ref 98–110)
Creat: 0.97 mg/dL — ABNORMAL HIGH (ref 0.60–0.95)
Globulin: 1.9 g/dL (calc) (ref 1.9–3.7)
Glucose, Bld: 118 mg/dL — ABNORMAL HIGH (ref 65–99)
Potassium: 4.6 mmol/L (ref 3.5–5.3)
Sodium: 134 mmol/L — ABNORMAL LOW (ref 135–146)
Total Bilirubin: 1.1 mg/dL (ref 0.2–1.2)
Total Protein: 5.9 g/dL — ABNORMAL LOW (ref 6.1–8.1)
eGFR: 59 mL/min/{1.73_m2} — ABNORMAL LOW (ref >=60)

## 2021-09-13 ENCOUNTER — Encounter: Payer: Self-pay | Admitting: Pulmonary Disease

## 2021-09-13 ENCOUNTER — Ambulatory Visit (INDEPENDENT_AMBULATORY_CARE_PROVIDER_SITE_OTHER): Payer: Medicare Other | Admitting: Pulmonary Disease

## 2021-09-13 VITALS — BP 120/70 | HR 81 | Ht 62.0 in | Wt 168.0 lb

## 2021-09-13 DIAGNOSIS — K658 Other peritonitis: Secondary | ICD-10-CM

## 2021-09-13 DIAGNOSIS — J90 Pleural effusion, not elsewhere classified: Secondary | ICD-10-CM | POA: Diagnosis not present

## 2021-09-13 NOTE — Patient Instructions (Signed)
Continue steroid taper as planned: ?20mg  4/15 - 5/13 ?15mg  5/14 - 6/11 ?10mg  6/12 - 7/10 ? 5mg   7/11 - 8/8 ?2.5mg  8/9 - 8/23 ?  ?Continue atovaquone daily until 5/13 ?  ?Follow up in 1 month ?

## 2021-09-13 NOTE — Progress Notes (Signed)
? ?Synopsis: Referred in October 2022 for hospital follow up for pleural effusions ? ?Subjective:  ? ?PATIENT ID: Rebecca Short GENDER: female DOB: [birth year], MRN: [id number] ? ?HPI ? ?Chief Complaint  ?Patient presents with  ? Follow-up  ?  1 mo f/u. States she is still improving. Able to walk long distances at the park without any breathing concerns. Currently on 20mg  of prednisone.   ? ?Rebecca Short is an 81 year old woman, never smoker with history of obstructive sleep apnea and atrial fibrillation who returns to pulmonary clinic for follow up of serositis and chronic left pleural effusion.  ? ?She remains on slow prednisone taper and is down to 20mg  daily along with atovaquone prophylaxis. She saw Dr. Benjamine Mola of rheumatology on 4/17 with no changes to her regimen.  ? ?She continues to feel well and continues her weekly exercise and strength regimen. She feels her stamina continues to improve. ? ?OV 08/20/21 ?She continues to do well since last visit and her stamina is increasing and her balance is getting better. She is getting out to walk in the local parks. She is currently taking 25mg  prednisone daily and atovaquone daily.  ? ?OV 07/23/21 ?She continues on imuran therapy 100mg  daily which has been increased from 50mg  daily by Dr. Benjamine Mola her rheumatologist, note from 07/09/21 reviewed. She has started her prednisone taper and is currently taking 30mg  daily. She remains on atovaquone prophylaxis. 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 ? ?She has done well since last visit. No increase in shortness of breath. She continues to progress with her physical activity. ? ?OV 06/25/21 ?She has been doing well since last visit. She was started on imuran per Dr. Benjamine Mola and she has follow up with him on 07/09/21. She remains on 40mg  of prednisone daily. She is working with home PT with good results. She reports her daily activities are increasing as she is now able to vacuum  2 rooms in her home compared to 1 previously.  ? ?Chest radiograph today shows chronic left pleural effusion, stable from last month. ? ?OV 05/22/21 ?She was evaluated by Dr. 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 Anti-smooth muscle anitbody is elevated. Cryoglobulins are negative.  ? ?Discussed pleural fluid studies further with Dr. Vic Ripper which showed mixed granulocytes and histioctyes along with some multinucleated giant cells. A definite diagnosis can't be placed on these findings, but he did raise concern for rheumatoid arthritis.  ? ?These upates were shared with the patient, her husband and friend.  ? ?Chest x-ray today shows persistent left pleural effusion and clear right lung.  ? ?Patient is feeling much better after increasing her prednisone back to 40mg  daily. She remains on prophylactic antibiotics. Her shoulder pain has also resolved with the prednisone.  ? ?She continues to work with PT each week and feels her conditioning in improving. ? ?OV 04/24/21 ?She was seen in acute visit on 11/28 by Derl Barrow, NP for increasing shortness of breath and pleuritic chest pain. She had tapered down to 5mg  of prednisone at this time. She had repeat thoracentesis on 04/08/21 based on CT chest from 04/22/21 which continued to show persistent left pleural effusion. Post-thoracentesis x-ray showed left pleural effusion and small right pleural effusion. Chest x-ray on 11/28 shows increased left pleural effusion.  ? ?Patient complains of increasing shortness of breath, bilateral shoulder pain and increasing left-sided discomfort. ? ?Labs from visit on 11/16 showed  rheumatoid factor I30 previously 57.2 on 01/21/2021.  P ANCA titer was 1:80 on 04/10/2021. ? ?The following labs were negative on 04/10/2021: ANA, anti-CCP, dsDNA antibody and previously negative for SSA and SSB and antihistone antibodies. ? ?OV 03/06/21 ?She was admitted 8/27 to 9/7 for pericardial effusion  and bilateral pleural effusions. She is status post pericardial drain and multiple thoracenteses. The pleural fluid studies were consistent with exudative process which were initially neutrophil predominant that later became lymphocyte predominant. Cytology was negative for malignancy but showed reactive mesothelial cells. ? ?She is being treated for serositis with prednisone taper and colchicine for the pericardial effusion. She has been on 40mg  of prednisone over the past month. She has been on pneumocystis prophylaxis with atovaquone, she developed skin rash with bactrim. Her inflammatory workup was rather unrevealing as ANA, CCP anti-histone ab and SSA/SSB were negative. Her rheumatoid factor was elevated at 57.2.  ? ?She is feeling significantly better since discharge is resuming her normal activity and is quite satisfied with her recovering thus far.  ? ?Chest radiograph today shows opacities at the left lung base. The right lung is significantly improved since prior study with resolution of right pleural effusion. ? ?Past Medical History:  ?Diagnosis Date  ? A-fib ([facility])   ? Allergy   ? Atrial fibrillation ([facility])   ? FH: cholecystectomy 1997  ? Macular degeneration   ? OSA (obstructive sleep apnea)   ? mild with total AHI 7.25/hr and severe during REM sleep at 35/hr now on CPAP at 6cm H2O  ?  ? ?Family History  ?Problem Relation Age of Onset  ? Osteoporosis Mother   ? Macular degeneration Mother   ? Heart Problems Father   ? Stroke Father   ? Diabetes Brother   ? Kidney failure Brother   ? Cancer Maternal Grandmother   ? Dementia Paternal Grandmother   ? Suicidality Other   ? Heart attack Neg Hx   ?  ? ?Social History  ? ?Socioeconomic History  ? Marital status: Married  ?  Spouse name: Not on file  ? Number of children: Not on file  ? Years of education: Not on file  ? Highest education level: Not on file  ?Occupational History  ? Not on file  ?Tobacco Use  ? Smoking status: Never  ? Smokeless tobacco: Never   ?Vaping Use  ? Vaping Use: Never used  ?Substance and Sexual Activity  ? Alcohol use: Never  ? Drug use: Never  ? Sexual activity: Not on file  ?Other Topics Concern  ? Not on file  ?Social History Narrative  ? ** Merged History Encounter **  ?    ? ?Social Determinants of Health  ? ?Financial Resource Strain: Not on file  ?Food Insecurity: Not on file  ?Transportation Needs: Not on file  ?Physical Activity: Not on file  ?Stress: Not on file  ?Social Connections: Not on file  ?Intimate Partner Violence: Not on file  ?  ? ?Allergies  ?Allergen Reactions  ? Erythromycin Other (See Comments)  ?  Other reaction(s): burning  ? Azithromycin Other (See Comments)  ?  Pain in arm with IV infusion ?  ? Condrolite [Glucosamine-Chondroitin-Msm] Rash  ? Glucosamine Rash  ?  Other reaction(s): rash ?Other reaction(s): rash  ? Sulfamethoxazole-Trimethoprim Rash  ?  Other reaction(s): rash ?Other reaction(s): rash  ?  ? ?Outpatient Medications Prior to Visit  ?Medication Sig Dispense Refill  ? atovaquone (MEPRON) 750 MG/5ML suspension Take 5 mLs (750 mg  total) by mouth 2 (two) times daily with a meal. 210 mL 1  ? azaTHIOprine (IMURAN) 50 MG tablet TAKE 2 TABLETS BY MOUTH EVERY DAY 180 tablet 1  ? CALCIUM CITRATE-VITAMIN D PO Take 1 tablet by mouth at bedtime.    ? carboxymethylcellulose (REFRESH PLUS) 0.5 % SOLN Place 1 drop into both eyes 2 (two) times daily.    ? ELIQUIS 5 MG TABS tablet TAKE 1 TABLET BY MOUTH TWICE A DAY 60 tablet 6  ? EPIPEN 2-PAK 0.3 MG/0.3ML SOAJ injection Inject 0.3 mg into the muscle once as needed for anaphylaxis (severe allergic reaction).  0  ? furosemide (LASIX) 20 MG tablet Take 1 tablet (20 mg total) by mouth daily. 90 tablet 1  ? hydrocortisone 2.5 % cream     ? metoprolol tartrate (LOPRESSOR) 25 MG tablet Take 0.5 tablets (12.5 mg total) by mouth daily. May increase to twice daily as tolerated. (Patient taking differently: Take 12.5 mg by mouth 2 (two) times daily.) 30 tablet 1  ? Multiple Vitamin  (MULTIVITAMIN WITH MINERALS) TABS tablet Take 1 tablet by mouth every morning.    ? Multiple Vitamins-Minerals (PRESERVISION AREDS PO) Take 1 capsule by mouth 2 (two) times daily.    ? pantoprazole (P

## 2021-09-17 ENCOUNTER — Encounter: Payer: Self-pay | Admitting: Pulmonary Disease

## 2021-09-19 ENCOUNTER — Other Ambulatory Visit: Payer: Self-pay | Admitting: Interventional Cardiology

## 2021-09-19 DIAGNOSIS — I480 Paroxysmal atrial fibrillation: Secondary | ICD-10-CM

## 2021-09-19 NOTE — Telephone Encounter (Signed)
Eliquis 5mg  refill request received. Patient is 81 years old, weight-76.2kg, Crea-0.97 on 09/09/2021, Diagnosis-Afib, and last seen by Dr. Irish Lack on 06/17/2021. Dose is appropriate based on dosing criteria. Will send in refill to requested pharmacy.   ?

## 2021-09-20 NOTE — Progress Notes (Signed)
Lab results look fine for continuing the imuran and prednisone taper as planned. Her lymphocytes, one of the types of white blood cells, is moderately low but improving and I am not concerned about this currently.

## 2021-09-23 ENCOUNTER — Ambulatory Visit: Payer: Medicare Other | Admitting: Internal Medicine

## 2021-09-27 ENCOUNTER — Telehealth: Payer: Self-pay | Admitting: Interventional Cardiology

## 2021-09-27 ENCOUNTER — Encounter: Payer: Self-pay | Admitting: Nurse Practitioner

## 2021-09-27 ENCOUNTER — Ambulatory Visit (INDEPENDENT_AMBULATORY_CARE_PROVIDER_SITE_OTHER): Payer: Medicare Other | Admitting: Nurse Practitioner

## 2021-09-27 ENCOUNTER — Ambulatory Visit (INDEPENDENT_AMBULATORY_CARE_PROVIDER_SITE_OTHER): Payer: Medicare Other

## 2021-09-27 VITALS — BP 122/70 | HR 100 | Ht 62.0 in | Wt 167.6 lb

## 2021-09-27 DIAGNOSIS — R5382 Chronic fatigue, unspecified: Secondary | ICD-10-CM | POA: Diagnosis not present

## 2021-09-27 DIAGNOSIS — K658 Other peritonitis: Secondary | ICD-10-CM

## 2021-09-27 DIAGNOSIS — J90 Pleural effusion, not elsewhere classified: Secondary | ICD-10-CM

## 2021-09-27 DIAGNOSIS — R5383 Other fatigue: Secondary | ICD-10-CM | POA: Insufficient documentation

## 2021-09-27 NOTE — Progress Notes (Signed)
? ?@Patient  ID: Rebecca Short, female    DOB: 26-Jul-1940, 81 y.o.   MRN: [id number] ? ?Chief Complaint  ?Patient presents with  ? Follow-up  ? ? ?Referring provider: ?Daisy Floro, MD ? ?HPI: ?81 year old female, never smoker followed for serositis and chronic left-sided pleural effusion.  She is currently on chronic prednisone and had a atovaquone daily for prophylaxis as she is allergic to Bactrim.  She is a patient of Dr. Lanora Manis and last seen in office 09/13/2021.  She is followed by Dr. Dimple Casey with rheumatology for concern for RA and is on Imuran.  Past medical history significant for PAF on Eliquis, OSA, obesity, vitamin D deficiency. ? ?TEST/EVENTS:  ?08/20/2021 CXR 2 view: Persistent left-sided pleural effusion and associated atelectasis/consolidation.  No significant change.  No other acute process noted. ? ?09/13/2021: OV with Dr. Francine Graven.  Pleural effusion remained stable as of most recent chest x-ray.  Continues to improve physically.  Advised to continue on 20 mg of prednisone daily and begin tapering next month.  Provided with taper schedule.  She is to continue on atovaquone until she is below 20 mg of prednisone daily. ? ?09/27/2021: Today-acute visit ?Patient presents today with husband for report of joint aches and pains and worsening fatigue.  She reports that her breathing is overall stable.  She denies any cough or worsening shortness of breath.  She is concerned that her pleural effusion has gotten worse as she has felt bad like this in the past when it has, but she also had changes in her breathing at that time too.  Denies any recent fevers, night sweats, weight loss, hemoptysis.  She is currently on 20 mg of prednisone daily and and anticipated to decrease to 15 mg daily on 5/14.  She has not notified rheumatology of any of these symptoms yet. ? ?Allergies  ?Allergen Reactions  ? Erythromycin Other (See Comments)  ?  Other reaction(s): burning  ? Azithromycin Other (See Comments)  ?  Pain in  arm with IV infusion ?  ? Condrolite [Glucosamine-Chondroitin-Msm] Rash  ? Glucosamine Rash  ?  Other reaction(s): rash ?Other reaction(s): rash  ? Sulfamethoxazole-Trimethoprim Rash  ?  Other reaction(s): rash ?Other reaction(s): rash  ? ? ?Immunization History  ?Administered Date(s) Administered  ? Influenza-Unspecified 01/24/2014  ? PFIZER(Purple Top)SARS-COV-2 Vaccination 06/16/2019, 07/07/2019  ? ? ?Past Medical History:  ?Diagnosis Date  ? A-fib ([facility])   ? Allergy   ? Atrial fibrillation ([facility])   ? FH: cholecystectomy 1997  ? Macular degeneration   ? OSA (obstructive sleep apnea)   ? mild with total AHI 7.25/hr and severe during REM sleep at 35/hr now on CPAP at 6cm H2O  ? ? ?Tobacco History: ?Social History  ? ?Tobacco Use  ?Smoking Status Never  ?Smokeless Tobacco Never  ? ?Counseling given: Not Answered ? ? ?Outpatient Medications Prior to Visit  ?Medication Sig Dispense Refill  ? apixaban (ELIQUIS) 5 MG TABS tablet TAKE 1 TABLET BY MOUTH TWICE A DAY 60 tablet 6  ? atovaquone (MEPRON) 750 MG/5ML suspension Take 5 mLs (750 mg total) by mouth 2 (two) times daily with a meal. 210 mL 1  ? azaTHIOprine (IMURAN) 50 MG tablet TAKE 2 TABLETS BY MOUTH EVERY DAY 180 tablet 1  ? CALCIUM CITRATE-VITAMIN D PO Take 1 tablet by mouth at bedtime.    ? carboxymethylcellulose (REFRESH PLUS) 0.5 % SOLN Place 1 drop into both eyes 2 (two) times daily.    ? EPIPEN 2-PAK 0.3 MG/0.3ML  SOAJ injection Inject 0.3 mg into the muscle once as needed for anaphylaxis (severe allergic reaction).  0  ? furosemide (LASIX) 20 MG tablet Take 1 tablet (20 mg total) by mouth daily. 90 tablet 1  ? hydrocortisone 2.5 % cream     ? metoprolol tartrate (LOPRESSOR) 25 MG tablet Take 0.5 tablets (12.5 mg total) by mouth daily. May increase to twice daily as tolerated. (Patient taking differently: Take 12.5 mg by mouth 2 (two) times daily.) 30 tablet 1  ? Multiple Vitamin (MULTIVITAMIN WITH MINERALS) TABS tablet Take 1 tablet by mouth every morning.     ? Multiple Vitamins-Minerals (PRESERVISION AREDS PO) Take 1 capsule by mouth 2 (two) times daily.    ? pantoprazole (PROTONIX) 40 MG tablet Take 1 tablet (40 mg total) by mouth daily at 12 noon. (Patient taking differently: Take 40 mg by mouth daily.) 90 tablet 2  ? potassium chloride (KLOR-CON) 20 MEQ packet Take 20 mEq by mouth daily. 90 each 2  ? predniSONE (DELTASONE) 20 MG tablet Take 2 tablets (40 mg total) by mouth daily with breakfast. (Patient taking differently: Take 30 mg by mouth daily with breakfast.) 60 tablet 5  ? PRESCRIPTION MEDICATION every 14 (fourteen) days. Allergy shots administered by Dr. Marion CallasSharma    ? ?No facility-administered medications prior to visit.  ? ? ? ?Review of Systems:  ? ?Constitutional: No weight loss or gain, night sweats, fevers, chills +fatigue, lassitude. ?HEENT: No headaches, difficulty swallowing, tooth/dental problems, or sore throat. No sneezing, itching, ear ache, nasal congestion, or post nasal drip ?CV:  No chest pain, orthopnea, PND, swelling in lower extremities, anasarca, dizziness, palpitations, syncope ?Resp: No shortness of breath with exertion or at rest. No excess mucus or change in color of mucus. No productive or non-productive. No hemoptysis. No wheezing.  No chest wall deformity ?GI:  No heartburn, indigestion, abdominal pain, nausea, vomiting, diarrhea, change in bowel habits, loss of appetite, bloody stools.  ?GU: No dysuria, change in color of urine, urgency or frequency.  No flank pain, no hematuria  ?Skin: No rash, lesions, ulcerations.  ?MSK:  +generalized joint aches/pains. No joint swelling.  No decreased range of motion.  No back pain. ?Neuro: No dizziness or lightheadedness.  ?Psych: No depression or anxiety. Mood stable.  ? ? ? ?Physical Exam: ? ?BP 122/70 (BP Location: Right Arm, Cuff Size: Normal)   Pulse 100   Ht 5\' 2"  (1.575 m)   Wt 167 lb 9.6 oz (76 kg)   SpO2 99%   BMI 30.65 kg/m?  ? ?GEN: Pleasant, interactive, well-appearing;  elderly; obese; in no acute distress. ?HEENT:  Normocephalic and atraumatic. EACs patent bilaterally. TM pearly gray with present light reflex bilaterally. PERRLA. Sclera white. Nasal turbinates pink, moist and patent bilaterally. No rhinorrhea present. Oropharynx pink and moist, without exudate or edema. No lesions, ulcerations, or postnasal drip.  ?NECK:  Supple w/ fair ROM. No JVD present. Normal carotid impulses w/o bruits. Thyroid symmetrical with no goiter or nodules palpated. No lymphadenopathy.   ?CV: RRR, no m/r/g, no peripheral edema. Pulses intact, +2 bilaterally. No cyanosis, pallor or clubbing. ?PULMONARY:  Unlabored, regular breathing. Decreased bases b/l L>R otherwise, clear bilaterally A&P w/o wheezes/rales/rhonchi. No accessory muscle use. No dullness to percussion. ?GI: BS present and normoactive. Soft, non-tender to palpation. No organomegaly or masses detected. No CVA tenderness. ?MSK: No erythema, warmth or tenderness. Cap refil <2 sec all extrem. No deformities or joint swelling noted.  ?Neuro: A/Ox3. No focal deficits noted.   ?  Skin: Warm, no lesions or rashe. Bruising on hands b/l (chronic) ?Psych: Normal affect and behavior. Judgement and thought content appropriate.  ? ? ? ?Lab Results: ? ?CBC ?   ?Component Value Date/Time  ? WBC 6.7 09/09/2021 1434  ? RBC 4.58 09/09/2021 1434  ? HGB 15.2 09/09/2021 1434  ? HGB 15.7 12/27/2018 0831  ? HCT 43.8 09/09/2021 1434  ? HCT 47.4 (H) 12/27/2018 0831  ? PLT 246 09/09/2021 1434  ? PLT 213 12/27/2018 0831  ? MCV 95.6 09/09/2021 1434  ? MCV 90 12/27/2018 0831  ? MCH 33.2 (H) 09/09/2021 1434  ? MCHC 34.7 09/09/2021 1434  ? RDW 17.1 (H) 09/09/2021 1434  ? RDW 13.1 12/27/2018 0831  ? LYMPHSABS 449 (L) 09/09/2021 1434  ? MONOABS 1.0 01/19/2021 1803  ? EOSABS 0 (L) 09/09/2021 1434  ? BASOSABS 20 09/09/2021 1434  ? ? ?BMET ?   ?Component Value Date/Time  ? NA 134 (L) 09/09/2021 1434  ? NA 139 02/06/2021 1557  ? K 4.6 09/09/2021 1434  ? CL 97 (L) 09/09/2021  1434  ? CO2 29 09/09/2021 1434  ? GLUCOSE 118 (H) 09/09/2021 1434  ? BUN 18 09/09/2021 1434  ? BUN 27 02/06/2021 1557  ? CREATININE 0.97 (H) 09/09/2021 1434  ? CALCIUM 9.5 09/09/2021 1434  ? GFRNONAA >60 01/30/2021

## 2021-09-27 NOTE — Assessment & Plan Note (Addendum)
Generalized joint aches/pains and fatigue. Could be RA flare vs side effects from Imuran. No other evidence of infectious process. She is anticoagulated with Eliquis so will check CBC to rule out anemia. Will check CMET today as well given current meds. Advised she notify rheum of current symptoms should they persist. ?

## 2021-09-27 NOTE — Assessment & Plan Note (Signed)
See above plan. 

## 2021-09-27 NOTE — Telephone Encounter (Signed)
Pt c/o medication issue: ? ?1. Name of Medication: furosemide (LASIX) 20 MG tablet ? ?2. How are you currently taking this medication (dosage and times per day)? Take 1 tablet (20 mg total) by mouth daily. ? ?3. Are you having a reaction (difficulty breathing--STAT)? No  ? ?4. What is your medication issue? Calling to see if she can reduce the dosage of the medication. Patient's think its causing her some dizziness. Patient predniSONE (DELTASONE) 20 MG tablet has been cut in half so she feels as that furosemide (LASIX) 20 MG tablet should be cut as well. Please advise ? ? ?

## 2021-09-27 NOTE — Telephone Encounter (Signed)
Spoke with the patient and advised to continue on Lasix 20 mg daily per Dr. Irish Lack.  ?

## 2021-09-27 NOTE — Assessment & Plan Note (Signed)
Pt is concerned due to symptoms that her pleural effusion is worse. We did discuss that she has not had a significant decrease in prednisone dosing since her last CXR, which was stable, and she has not developed any respiratory symptoms. She would feel much more comfortable if we repeated CXR today to ensure; CXR overall appeared stable. Advised she continue to taper prednisone as previously prescribed and monitor her respiratory symptoms.  ? ?Patient Instructions  ?Continue Prednisone 20 mg daily until 5/14 ?Below is the planned steroid taper: ?20mg  4/15 - 5/13 ?15mg  5/14 - 6/11 ?10mg  6/12 - 7/10 ? 5mg   7/11 - 8/8 ?2.5mg  8/9 - 8/23 ?Continue prophylactic atovaquone until below 20mg  daily dosing of prednisone ? ?Follow up in one month with Dr. Erin Fulling. If symptoms do not improve or worsen, please contact office for sooner follow up or seek emergency care. ? ? ?

## 2021-09-27 NOTE — Patient Instructions (Addendum)
Continue Prednisone 20 mg daily until 5/14 ?Below is the planned steroid taper: ?20mg  4/15 - 5/13 ?15mg  5/14 - 6/11 ?10mg  6/12 - 7/10 ? 5mg   7/11 - 8/8 ?2.5mg  8/9 - 8/23 ?Continue prophylactic atovaquone until below 20mg  daily dosing of prednisone ? ?Follow up in one month with Dr. Francine Graven. If symptoms do not improve or worsen, please contact office for sooner follow up or seek emergency care. ?

## 2021-09-27 NOTE — Telephone Encounter (Signed)
Pt calling in to see if she can reduce her Lasix dose as she is reducing/tampering her Prednisone. ?States she is currently on Lasix due to Prednisone that she is on for "fluid retention around my lungs".  "My understanding is I was given Lasix to counteract the fluid from Prednisone".  ?She will tamper down to 15 mg of Prednisone on 5/14. ?Informed that Lasix 20 mg is lowest dose that is prescribed. ?Reports that sometimes she doesn't feel great.  States it is not exactly dizziness, but "my head feels like things are floating out there". ?"I wouldn't want to turn real fast or make quick movements". ?She then explains it has been going on for awhile, this is not new. ?Pt understands that most likely she will remain on current dose of Lasix. ?Aware will forward to MD for review/advisement. ?Patient verbalized understanding and agreeable to plan.  ? ? ?BPs are "doing fine".   ?This morning   109/73 ?Yesterday morning  119/79 ?Day before 112/79 ?(Pt reports this are normal/typical blood pressures for her) ? ? ?

## 2021-09-28 LAB — CBC WITH DIFFERENTIAL/PLATELET
Absolute Monocytes: 420 cells/uL (ref 200–950)
Basophils Absolute: 8 cells/uL (ref 0–200)
Basophils Relative: 0.1 %
Eosinophils Absolute: 0 cells/uL — ABNORMAL LOW (ref 15–500)
Eosinophils Relative: 0 %
HCT: 43.4 % (ref 35.0–45.0)
Hemoglobin: 14.9 g/dL (ref 11.7–15.5)
Lymphs Abs: 412 cells/uL — ABNORMAL LOW (ref 850–3900)
MCH: 34.2 pg — ABNORMAL HIGH (ref 27.0–33.0)
MCHC: 34.3 g/dL (ref 32.0–36.0)
MCV: 99.5 fL (ref 80.0–100.0)
MPV: 11.4 fL (ref 7.5–12.5)
Monocytes Relative: 5 %
Neutro Abs: 7560 cells/uL (ref 1500–7800)
Neutrophils Relative %: 90 %
Platelets: 245 10*3/uL (ref 140–400)
RBC: 4.36 10*6/uL (ref 3.80–5.10)
RDW: 16.1 % — ABNORMAL HIGH (ref 11.0–15.0)
Total Lymphocyte: 4.9 %
WBC: 8.4 10*3/uL (ref 3.8–10.8)

## 2021-09-28 LAB — COMPREHENSIVE METABOLIC PANEL
AG Ratio: 2.1 (calc) (ref 1.0–2.5)
ALT: 12 U/L (ref 6–29)
AST: 11 U/L (ref 10–35)
Albumin: 3.9 g/dL (ref 3.6–5.1)
Alkaline phosphatase (APISO): 44 U/L (ref 37–153)
BUN: 17 mg/dL (ref 7–25)
CO2: 28 mmol/L (ref 20–32)
Calcium: 9 mg/dL (ref 8.6–10.4)
Chloride: 95 mmol/L — ABNORMAL LOW (ref 98–110)
Creat: 0.88 mg/dL (ref 0.60–0.95)
Globulin: 1.9 g/dL (calc) (ref 1.9–3.7)
Glucose, Bld: 125 mg/dL — ABNORMAL HIGH (ref 65–99)
Potassium: 4.5 mmol/L (ref 3.5–5.3)
Sodium: 132 mmol/L — ABNORMAL LOW (ref 135–146)
Total Bilirubin: 0.9 mg/dL (ref 0.2–1.2)
Total Protein: 5.8 g/dL — ABNORMAL LOW (ref 6.1–8.1)

## 2021-09-30 ENCOUNTER — Telehealth: Payer: Self-pay | Admitting: Internal Medicine

## 2021-09-30 NOTE — Telephone Encounter (Signed)
Patient left a voicemail stating she was seen by her pulmonologist on Friday as she was not feeling well. Patient states she would like Dr. Benjamine Mola to review those notes in her chart. Patient states she does not need a call back just wants Dr. Benjamine Mola aware.   ?

## 2021-10-02 NOTE — Progress Notes (Signed)
Please notify patient that her CBC did not show any evidence of anemia.  Her CMET showed normal liver enzymes and kidney function.  She did have a slight decrease in her sodium levels.  Would advise that she recheck in a few weeks with her PCP.  If she is still having generalized joint aches/pains and fatigue, advise she follow-up with her rheumatologist if she has not already.

## 2021-10-11 ENCOUNTER — Other Ambulatory Visit: Payer: Self-pay | Admitting: Interventional Cardiology

## 2021-10-17 ENCOUNTER — Ambulatory Visit (INDEPENDENT_AMBULATORY_CARE_PROVIDER_SITE_OTHER): Payer: Medicare Other | Admitting: Ophthalmology

## 2021-10-17 ENCOUNTER — Encounter (INDEPENDENT_AMBULATORY_CARE_PROVIDER_SITE_OTHER): Payer: Self-pay | Admitting: Ophthalmology

## 2021-10-17 DIAGNOSIS — H35372 Puckering of macula, left eye: Secondary | ICD-10-CM | POA: Diagnosis not present

## 2021-10-17 DIAGNOSIS — H353221 Exudative age-related macular degeneration, left eye, with active choroidal neovascularization: Secondary | ICD-10-CM

## 2021-10-17 DIAGNOSIS — H353132 Nonexudative age-related macular degeneration, bilateral, intermediate dry stage: Secondary | ICD-10-CM

## 2021-10-17 MED ORDER — RANIBIZUMAB 0.5 MG/0.05ML IZ SOSY
0.5000 mg | PREFILLED_SYRINGE | INTRAVITREAL | Status: AC | PRN
  Administered 2021-10-17: .5 mg via INTRAVITREAL

## 2021-10-17 NOTE — Assessment & Plan Note (Signed)
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  Patient was offered a little longer interval to extend without planned injection in 4 months.  Patient opts to maintain maintenance level dosing to prevent recurrence again

## 2021-10-17 NOTE — Progress Notes (Signed)
10/17/2021     CHIEF COMPLAINT Patient presents for  Chief Complaint  Patient presents with   Macular Degeneration      HISTORY OF PRESENT ILLNESS: Rebecca Short is a 81 y.o. female who presents to the clinic today for:   HPI   4 MOS for DILATE OU, LUCENTIS 0.5 OCT, OS. Pt stated vision has been stable since last visit. Pt denies new floaters and FOL.  Last edited by Silvestre Moment on 10/17/2021 12:57 PM.      Referring physician: Lawerance Cruel, MD [place],  [place] [address]  HISTORICAL INFORMATION:   Selected notes from the MEDICAL RECORD NUMBER    Lab Results  Component Value Date   HGBA1C 5.4 12/23/2020     CURRENT MEDICATIONS: Current Outpatient Medications (Ophthalmic Drugs)  Medication Sig   carboxymethylcellulose (REFRESH PLUS) 0.5 % SOLN Place 1 drop into both eyes 2 (two) times daily.   No current facility-administered medications for this visit. (Ophthalmic Drugs)   Current Outpatient Medications (Other)  Medication Sig   apixaban (ELIQUIS) 5 MG TABS tablet TAKE 1 TABLET BY MOUTH TWICE A DAY   atovaquone (MEPRON) 750 MG/5ML suspension Take 5 mLs (750 mg total) by mouth 2 (two) times daily with a meal.   azaTHIOprine (IMURAN) 50 MG tablet TAKE 2 TABLETS BY MOUTH EVERY DAY   CALCIUM CITRATE-VITAMIN D PO Take 1 tablet by mouth at bedtime.   EPIPEN 2-PAK 0.3 MG/0.3ML SOAJ injection Inject 0.3 mg into the muscle once as needed for anaphylaxis (severe allergic reaction).   furosemide (LASIX) 20 MG tablet Take 1 tablet (20 mg total) by mouth daily.   hydrocortisone 2.5 % cream    metoprolol tartrate (LOPRESSOR) 25 MG tablet Take 0.5 tablets (12.5 mg total) by mouth daily. May increase to twice daily as tolerated. (Patient taking differently: Take 12.5 mg by mouth 2 (two) times daily.)   Multiple Vitamin (MULTIVITAMIN WITH MINERALS) TABS tablet Take 1 tablet by mouth every morning.   Multiple Vitamins-Minerals (PRESERVISION AREDS PO) Take 1  capsule by mouth 2 (two) times daily.   pantoprazole (PROTONIX) 40 MG tablet Take 1 tablet (40 mg total) by mouth daily at 12 noon. (Patient taking differently: Take 40 mg by mouth daily.)   potassium chloride (KLOR-CON) 20 MEQ packet USE 1 PACKET ONCE DAILY   predniSONE (DELTASONE) 20 MG tablet Take 2 tablets (40 mg total) by mouth daily with breakfast. (Patient taking differently: Take 30 mg by mouth daily with breakfast.)   PRESCRIPTION MEDICATION every 14 (fourteen) days. Allergy shots administered by Dr. Donneta Romberg   No current facility-administered medications for this visit. (Other)      REVIEW OF SYSTEMS: ROS   Negative for: Constitutional, Gastrointestinal, Neurological, Skin, Genitourinary, Musculoskeletal, HENT, Endocrine, Cardiovascular, Eyes, Respiratory, Psychiatric, Allergic/Imm, Heme/Lymph Last edited by Silvestre Moment on 10/17/2021 12:57 PM.       ALLERGIES Allergies  Allergen Reactions   Erythromycin Other (See Comments)    Other reaction(s): burning   Azithromycin Other (See Comments)    Pain in arm with IV infusion    Condrolite [Glucosamine-Chondroitin-Msm] Rash   Glucosamine Rash    Other reaction(s): rash Other reaction(s): rash   Sulfamethoxazole-Trimethoprim Rash    Other reaction(s): rash Other reaction(s): rash    PAST MEDICAL HISTORY Past Medical History:  Diagnosis Date   A-fib [facility])    Allergy    Atrial fibrillation (HCC)    FH: cholecystectomy 1997   Macular degeneration  OSA (obstructive sleep apnea)    mild with total AHI 7.25/hr and severe during REM sleep at 35/hr now on CPAP at 6cm H2O   Past Surgical History:  Procedure Laterality Date   CHOLECYSTECTOMY     PERICARDIOCENTESIS N/A 01/07/2021   Procedure: PERICARDIOCENTESIS;  Surgeon: Nelva Bush, MD;  Location: [place] [facility] LAB;  Service: Cardiovascular;  Laterality: N/A;   REPLACEMENT TOTAL KNEE BILATERAL     THORACENTESIS N/A 01/25/2021   Procedure: THORACENTESIS;  Surgeon: Freddi Starr, MD;  Location: [facility] ENDOSCOPY;  Service: Pulmonary;  Laterality: N/A;   THORACENTESIS N/A 04/08/2021   Procedure: THORACENTESIS;  Surgeon: Freddi Starr, MD;  Location: [facility] ENDOSCOPY;  Service: Pulmonary;  Laterality: N/A;   THORACENTESIS N/A 04/24/2021   Procedure: THORACENTESIS;  Surgeon: Juanito Doom, MD;  Location: [place];  Service: Cardiopulmonary;  Laterality: N/A;    FAMILY HISTORY Family History  Problem Relation Age of Onset   Osteoporosis Mother    Macular degeneration Mother    Heart Problems Father    Stroke Father    Diabetes Brother    Kidney failure Brother    Cancer Maternal Grandmother    Dementia Paternal Grandmother    Suicidality Other    Heart attack Neg Hx     SOCIAL HISTORY Social History   Tobacco Use   Smoking status: Never   Smokeless tobacco: Never  Vaping Use   Vaping Use: Never used  Substance Use Topics   Alcohol use: Never   Drug use: Never         OPHTHALMIC EXAM:  Base Eye Exam     Visual Acuity (ETDRS)       Right Left   Dist cc 20/20 -2 20/25 -1 +1    Correction: Glasses         Tonometry (Tonopen, 1:02 PM)       Right Left   Pressure 14 14         Pupils       Pupils APD   Right PERRL None   Left PERRL None         Visual Fields       Left Right    Full Full         Extraocular Movement       Right Left    Full Full         Neuro/Psych     Oriented x3: Yes   Mood/Affect: Normal         Dilation     Both eyes: 1.0% Mydriacyl, 2.5% Phenylephrine @ 1:02 PM           Slit Lamp and Fundus Exam     External Exam       Right Left   External Normal Normal         Slit Lamp Exam       Right Left   Lids/Lashes Normal Normal   Conjunctiva/Sclera White and quiet White and quiet   Cornea Clear Clear   Anterior Chamber Deep and quiet Deep and quiet   Iris Round and reactive Round and reactive   Lens Posterior chamber intraocular lens Posterior chamber  intraocular lens   Anterior Vitreous Normal Normal         Fundus Exam       Right Left   Posterior Vitreous Posterior vitreous detachment Posterior vitreous detachment   Disc Normal Normal   C/D Ratio 0.55 0.25   Macula Retinal pigment epithelial  mottling, Early age related macular degeneration, no hemorrhage, no exudates, no macular thickening, Hard drusen Retinal pigment epithelial mottling, Early age related macular degeneration, Pigmented atrophy, no hemorrhage, no exudates, no macular thickening, Soft drusen   Vessels Normal Normal   Periphery Normal Normal            IMAGING AND PROCEDURES  Imaging and Procedures for 10/17/21  OCT, Retina - OU - Both Eyes       Right Eye Quality was good. Scan locations included subfoveal. Central Foveal Thickness: 308. Progression has improved. Findings include abnormal foveal contour, retinal drusen , no SRF, no IRF, epiretinal membrane.   Left Eye Quality was good. Scan locations included subfoveal. Central Foveal Thickness: 308. Progression has improved. Findings include no IRF, no SRF, abnormal foveal contour, epiretinal membrane.   Notes OS, remained stable at  4 months interval with history of recurrences, will repeat intravitreal Lucentis today and examination again in 4 months.  OS epiretinal membrane nasal aspect to FAZ continues to thicken.  No impact on acuity at this However   We will continue to monitor region temporal to the fovea with irregular drusenoid pigment epithelial detachment  Minor epiretinal membrane right eye with no topographic distortion observed     Intravitreal Injection, Pharmacologic Agent - OS - Left Eye       Time Out 10/17/2021. 1:38 PM. Confirmed correct patient, procedure, site, and patient consented.   Anesthesia No anesthesia was used. Anesthetic medications included Lidocaine 4%.   Procedure Preparation included Tobramycin 0.3%, 10% betadine to eyelids, 5% betadine to ocular  surface, Ofloxacin . A 30 gauge needle was used.   Injection: 0.5 mg Ranibizumab 0.5 MG/0.05ML   Route: Intravitreal, Site: Left Eye   NDC: [id number], Lot: [id number], Waste: 0 mL   Post-op Post injection exam found visual acuity of at least counting fingers. The patient tolerated the procedure well. There were no complications. The patient received written and verbal post procedure care education. Post injection medications included ocuflox.              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  Patient was offered a little longer interval to extend without planned injection in 4 months.  Patient opts to maintain maintenance level dosing to prevent recurrence again     ICD-10-CM   1. 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HCC)  H35.3221 OCT, Retina - OU - Both Eyes    Intravitreal Injection, Pharmacologic Agent - OS - Left Eye    Ranibizumab SOSY 0.5 mg    2. Intermediate stage nonexudative age-related macular degeneration of both eyes  H35.3132     3. Macular pucker, left eye  H35.372       1.  OS with controlled CNVM on Lucentis at current 65-month follow-up interval.  Repeat injection today  2.  Dilate OU next 4 months for follow-up as well epiretinal membrane OU  3.  Ophthalmic Meds Ordered this visit:  Meds ordered this encounter  Medications   Ranibizumab SOSY 0.5 mg       Return in about 4 months (around 02/17/2022) for DILATE OU,  LUCENTIS 0.5 OCT, OS.  There are no Patient Instructions on file for  this visit.   Explained the diagnoses, plan, and follow up with the patient and they expressed understanding.  Patient expressed understanding of the importance of proper follow up care.   Clent Demark Jasenia Weilbacher M.D. Diseases & Surgery of the Retina and Vitreous [facility] [place] 10/17/21     Abbreviations: M myopia (nearsighted); A astigmatism; H hyperopia (farsighted); P presbyopia; Mrx spectacle prescription;  CTL contact lenses; OD right eye; OS left eye; OU both eyes  XT exotropia; ET esotropia; PEK punctate epithelial keratitis; PEE punctate epithelial erosions; DES dry eye syndrome; MGD meibomian gland dysfunction; ATs artificial tears; PFAT's preservative free artificial tears; [place] nuclear sclerotic cataract; PSC posterior subcapsular cataract; ERM epi-retinal membrane; PVD posterior vitreous detachment; RD retinal detachment; DM diabetes mellitus; DR diabetic retinopathy; NPDR non-proliferative diabetic retinopathy; PDR proliferative diabetic retinopathy; CSME clinically significant macular edema; DME diabetic macular edema; dbh dot blot hemorrhages; CWS cotton wool spot; POAG primary open angle glaucoma; C/D cup-to-disc ratio; HVF humphrey visual field; GVF goldmann visual field; OCT optical coherence tomography; IOP intraocular pressure; BRVO Branch retinal vein occlusion; CRVO central retinal vein occlusion; CRAO central retinal artery occlusion; BRAO branch retinal artery occlusion; RT retinal tear; SB scleral buckle; PPV pars plana vitrectomy; VH Vitreous hemorrhage; PRP panretinal laser photocoagulation; IVK intravitreal kenalog; VMT vitreomacular traction; MH Macular hole;  NVD neovascularization of the disc; NVE neovascularization elsewhere; AREDS age related eye disease study; ARMD age related macular degeneration; POAG primary open angle glaucoma; EBMD epithelial/anterior basement membrane dystrophy; ACIOL anterior chamber intraocular lens; IOL intraocular lens; PCIOL posterior  chamber intraocular lens; Phaco/IOL phacoemulsification with intraocular lens placement; [place] photorefractive keratectomy; LASIK laser assisted in situ keratomileusis; HTN hypertension; DM diabetes mellitus; COPD chronic obstructive pulmonary disease

## 2021-10-17 NOTE — Assessment & Plan Note (Signed)
Minor progression nasal aspect to FAZ OS, no impact on acuity observe

## 2021-10-17 NOTE — Assessment & Plan Note (Signed)
OD no sign of CNVM 

## 2021-10-22 ENCOUNTER — Ambulatory Visit (INDEPENDENT_AMBULATORY_CARE_PROVIDER_SITE_OTHER): Payer: Medicare Other | Admitting: Pulmonary Disease

## 2021-10-22 ENCOUNTER — Encounter: Payer: Self-pay | Admitting: Pulmonary Disease

## 2021-10-22 ENCOUNTER — Ambulatory Visit (INDEPENDENT_AMBULATORY_CARE_PROVIDER_SITE_OTHER): Payer: Medicare Other

## 2021-10-22 VITALS — BP 120/76 | HR 86 | Ht 62.0 in | Wt 166.0 lb

## 2021-10-22 DIAGNOSIS — J90 Pleural effusion, not elsewhere classified: Secondary | ICD-10-CM

## 2021-10-22 DIAGNOSIS — K658 Other peritonitis: Secondary | ICD-10-CM | POA: Diagnosis not present

## 2021-10-22 NOTE — Patient Instructions (Signed)
Prednisone Taper: 15mg  5/14 - 6/11 10mg  6/12 - 7/10  5mg   7/11 - 8/8 2.5mg  8/9 - 8/23  We will check another chest x-ray today

## 2021-10-22 NOTE — Progress Notes (Signed)
Synopsis: Referred in October 2022 for hospital follow up for pleural effusions  Subjective:   PATIENT ID: Rebecca Short GENDER: female DOB: January 09, 1941, MRN: [id number]  HPI  Chief Complaint  Patient presents with   Follow-up    1 mo f/u. Currently on prednisone 15mg  daily.    Rebecca Short is an 81 year old woman, never smoker with history of obstructive sleep apnea and atrial fibrillation who returns to pulmonary clinic for follow up of serositis and chronic left pleural effusion.   She was seen by Roxan Diesel, NP on 09/27/21 for acute visit due to worsening joint pains and concern for worsening of her pleural effusion. Chest radiograph was overall stable compared to March. It was recommended she continue on the scheduled prednisone taper.   Since that visit she has not had further issues with joint pains or return of dyspnea. She overall feels well. She is currently taking 15mg  prednisone daily and has stopped atovaquone antibiotic.  She complains of ear fullness and having trouble hearing and is scheduled to see ENT next month.   OV 09/13/21 She remains on slow prednisone taper and is down to 20mg  daily along with atovaquone prophylaxis. She saw Dr. Benjamine Mola of rheumatology on 4/17 with no changes to her regimen.   She continues to feel well and continues her weekly exercise and strength regimen. She feels her stamina continues to improve.  OV 08/20/21 She continues to do well since last visit and her stamina is increasing and her balance is getting better. She is getting out to walk in the local parks. She is currently taking 25mg  prednisone daily and atovaquone daily.   OV 07/23/21 She continues on imuran therapy 100mg  daily which has been increased from 50mg  daily by Dr. Benjamine Mola her rheumatologist, note from 07/09/21 reviewed. She has started her prednisone taper and is currently taking 30mg  daily. She remains on atovaquone prophylaxis. 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  She has done well since last visit. No increase in shortness of breath. She continues to progress with her physical activity.  OV 06/25/21 She has been doing well since last visit. She was started on imuran per Dr. Benjamine Mola and she has follow up with him on 07/09/21. She remains on 40mg  of prednisone daily. She is working with home PT with good results. She reports her daily activities are increasing as she is now able to vacuum 2 rooms in her home compared to 1 previously.   Chest radiograph today shows chronic left pleural effusion, stable from last month.  OV 05/22/21 She was evaluated by Dr. 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 Anti-smooth muscle anitbody is elevated. Cryoglobulins are negative.   Discussed pleural fluid studies further with Dr. Vic Ripper which showed mixed granulocytes and histioctyes along with some multinucleated giant cells. A definite diagnosis can't be placed on these findings, but he did raise concern for rheumatoid arthritis.   These upates were shared with the patient, her husband and friend.   Chest x-ray today shows persistent left pleural effusion and clear right lung.   Patient is feeling much better after increasing her prednisone back to 40mg  daily. She remains on prophylactic antibiotics. Her shoulder pain has also resolved with the prednisone.   She continues to work with PT each week and feels her conditioning in improving.  OV 04/24/21 She was seen in acute visit on 11/28 by Derl Barrow, NP for  increasing shortness of breath and pleuritic chest pain. She had tapered down to 5mg  of prednisone at this time. She had repeat thoracentesis on 04/08/21 based on CT chest from 04/22/21 which continued to show persistent left pleural effusion. Post-thoracentesis x-ray showed left pleural effusion and small right pleural effusion. Chest x-ray on 11/28 shows increased  left pleural effusion.   Patient complains of increasing shortness of breath, bilateral shoulder pain and increasing left-sided discomfort.  Labs from visit on 11/16 showed rheumatoid factor I30 previously 57.2 on 01/21/2021.  P ANCA titer was 1:80 on 04/10/2021.  The following labs were negative on 04/10/2021: ANA, anti-CCP, dsDNA antibody and previously negative for SSA and SSB and antihistone antibodies.  OV 03/06/21 She was admitted 8/27 to 9/7 for pericardial effusion and bilateral pleural effusions. She is status post pericardial drain and multiple thoracenteses. The pleural fluid studies were consistent with exudative process which were initially neutrophil predominant that later became lymphocyte predominant. Cytology was negative for malignancy but showed reactive mesothelial cells.  She is being treated for serositis with prednisone taper and colchicine for the pericardial effusion. She has been on 40mg  of prednisone over the past month. She has been on pneumocystis prophylaxis with atovaquone, she developed skin rash with bactrim. Her inflammatory workup was rather unrevealing as ANA, CCP anti-histone ab and SSA/SSB were negative. Her rheumatoid factor was elevated at 57.2.   She is feeling significantly better since discharge is resuming her normal activity and is quite satisfied with her recovering thus far.   Chest radiograph today shows opacities at the left lung base. The right lung is significantly improved since prior study with resolution of right pleural effusion.  Past Medical History:  Diagnosis Date   A-fib [facility])    Allergy    Atrial fibrillation (HCC)    FH: cholecystectomy 1997   Macular degeneration    OSA (obstructive sleep apnea)    mild with total AHI 7.25/hr and severe during REM sleep at 35/hr now on CPAP at 6cm H2O     Family History  Problem Relation Age of Onset   Osteoporosis Mother    Macular degeneration Mother    Heart Problems Father    Stroke  Father    Diabetes Brother    Kidney failure Brother    Cancer Maternal Grandmother    Dementia Paternal Grandmother    Suicidality Other    Heart attack Neg Hx      Social History   Socioeconomic History   Marital status: Married    Spouse name: Not on file   Number of children: Not on file   Years of education: Not on file   Highest education level: Not on file  Occupational History   Not on file  Tobacco Use   Smoking status: Never   Smokeless tobacco: Never  Vaping Use   Vaping Use: Never used  Substance and Sexual Activity   Alcohol use: Never   Drug use: Never   Sexual activity: Not on file  Other Topics Concern   Not on file  Social History Narrative   ** Merged History Encounter **       Social Determinants of Health   Financial Resource Strain: Not on file  Food Insecurity: Not on file  Transportation Needs: Not on file  Physical Activity: Not on file  Stress: Not on file  Social Connections: Not on file  Intimate Partner Violence: Not on file     Allergies  Allergen Reactions   Erythromycin  Other (See Comments)    Other reaction(s): burning   Azithromycin Other (See Comments)    Pain in arm with IV infusion    Condrolite [Glucosamine-Chondroitin-Msm] Rash   Glucosamine Rash    Other reaction(s): rash Other reaction(s): rash   Sulfamethoxazole-Trimethoprim Rash    Other reaction(s): rash Other reaction(s): rash     Outpatient Medications Prior to Visit  Medication Sig Dispense Refill   apixaban (ELIQUIS) 5 MG TABS tablet TAKE 1 TABLET BY MOUTH TWICE A DAY 60 tablet 6   azaTHIOprine (IMURAN) 50 MG tablet TAKE 2 TABLETS BY MOUTH EVERY DAY 180 tablet 1   CALCIUM CITRATE-VITAMIN D PO Take 1 tablet by mouth at bedtime.     carboxymethylcellulose (REFRESH PLUS) 0.5 % SOLN Place 1 drop into both eyes 2 (two) times daily.     EPIPEN 2-PAK 0.3 MG/0.3ML SOAJ injection Inject 0.3 mg into the muscle once as needed for anaphylaxis (severe allergic  reaction).  0   furosemide (LASIX) 20 MG tablet Take 1 tablet (20 mg total) by mouth daily. 90 tablet 1   hydrocortisone 2.5 % cream      metoprolol tartrate (LOPRESSOR) 25 MG tablet Take 0.5 tablets (12.5 mg total) by mouth daily. May increase to twice daily as tolerated. (Patient taking differently: Take 12.5 mg by mouth 2 (two) times daily.) 30 tablet 1   Multiple Vitamin (MULTIVITAMIN WITH MINERALS) TABS tablet Take 1 tablet by mouth every morning.     Multiple Vitamins-Minerals (PRESERVISION AREDS PO) Take 1 capsule by mouth 2 (two) times daily.     pantoprazole (PROTONIX) 40 MG tablet Take 1 tablet (40 mg total) by mouth daily at 12 noon. (Patient taking differently: Take 40 mg by mouth daily.) 90 tablet 2   potassium chloride (KLOR-CON) 20 MEQ packet USE 1 PACKET ONCE DAILY 90 packet 2   predniSONE (DELTASONE) 20 MG tablet Take 2 tablets (40 mg total) by mouth daily with breakfast. (Patient taking differently: Take 15 mg by mouth daily with breakfast.) 60 tablet 5   PRESCRIPTION MEDICATION every 14 (fourteen) days. Allergy shots administered by Dr. Donneta Romberg     atovaquone [facility]) 750 MG/5ML suspension Take 5 mLs (750 mg total) by mouth 2 (two) times daily with a meal. 210 mL 1   No facility-administered medications prior to visit.    Review of Systems  Constitutional:  Negative for chills, fever, malaise/fatigue and weight loss.  HENT:  Negative for congestion, sinus pain and sore throat.   Eyes: Negative.   Respiratory:  Negative for cough, hemoptysis, sputum production, shortness of breath and wheezing.   Cardiovascular:  Negative for chest pain, palpitations, orthopnea, claudication and leg swelling.  Gastrointestinal:  Negative for abdominal pain, heartburn, nausea and vomiting.  Genitourinary: Negative.   Musculoskeletal:  Negative for joint pain and myalgias.  Skin:  Negative for rash.  Neurological:  Negative for weakness.  Endo/Heme/Allergies:  Bruises/bleeds easily.   Psychiatric/Behavioral: Negative.     Objective:   Vitals:   10/22/21 1000  BP: 120/76  Pulse: 86  SpO2: 97%  Weight: 166 lb (75.3 kg)  Height: 5\' 2"  (1.575 m)     Physical Exam Constitutional:      General: She is not in acute distress.    Appearance: She is not ill-appearing.  HENT:     Head: Normocephalic and atraumatic.  Eyes:     Conjunctiva/sclera: Conjunctivae normal.  Cardiovascular:     Rate and Rhythm: Normal rate and regular rhythm.  Pulses: Normal pulses.     Heart sounds: Normal heart sounds. No murmur heard. Pulmonary:     Effort: Pulmonary effort is normal.     Breath sounds: Examination of the left-middle field reveals decreased breath sounds. Examination of the left-lower field reveals decreased breath sounds. Decreased breath sounds present. No wheezing, rhonchi or rales.  Musculoskeletal:     Right lower leg: No edema.     Left lower leg: No edema.  Skin:    General: Skin is warm and dry.     Findings: Bruising (on hands b/l) present.  Neurological:     General: No focal deficit present.     Mental Status: She is alert.  Psychiatric:        Mood and Affect: Mood normal.        Behavior: Behavior normal.        Thought Content: Thought content normal.        Judgment: Judgment normal.   CBC    Component Value Date/Time   WBC 8.4 09/27/2021 1622   RBC 4.36 09/27/2021 1622   HGB 14.9 09/27/2021 1622   HGB 15.7 12/27/2018 0831   HCT 43.4 09/27/2021 1622   HCT 47.4 (H) 12/27/2018 0831   PLT 245 09/27/2021 1622   PLT 213 12/27/2018 0831   MCV 99.5 09/27/2021 1622   MCV 90 12/27/2018 0831   MCH 34.2 (H) 09/27/2021 1622   MCHC 34.3 09/27/2021 1622   RDW 16.1 (H) 09/27/2021 1622   RDW 13.1 12/27/2018 0831   LYMPHSABS 412 (L) 09/27/2021 1622   MONOABS 1.0 01/19/2021 1803   EOSABS 0 (L) 09/27/2021 1622   BASOSABS 8 09/27/2021 1622      Latest Ref Rng & Units 09/27/2021    4:22 PM 09/09/2021    2:34 PM 07/09/2021    3:24 PM  BMP  Glucose  65 - 99 mg/dL 125   118   123    BUN 7 - 25 mg/dL 17   18   23     Creatinine 0.60 - 0.95 mg/dL 0.88   0.97   1.03    BUN/Creat Ratio 6 - 22 (calc) NOT APPLICABLE   19   22    Sodium 135 - 146 mmol/L 132   134   136    Potassium 3.5 - 5.3 mmol/L 4.5   4.6   5.0    Chloride 98 - 110 mmol/L 95   97   98    CO2 20 - 32 mmol/L 28   29   30     Calcium 8.6 - 10.4 mg/dL 9.0   9.5   9.6     Chest imaging: CXR 10/22/21 Stable left effusion. No blunting of the right costophrenic angle.  CXR 09/27/21 Mild-to-moderate cardiomegaly. Again seen is the obscuration of the left cardiac border and hemidiaphragm of pleural effusion with likely superimposed atelectasis/consolidation without significant interval change. There is some blunting of the right CP angle seen of likely small pleural effusion  CXR 08/20/21 Similar appearance of the chest x-ray with left-sided pleural effusion and associated atelectasis/consolidation  CXR 06/25/21 1. Stable small left pleural effusion. 2. No new focal lung infiltrate.  CXR 05/22/21 There is a small to medium size left pleural effusion, not significantly changed since 04/24/2021. Aeration of the left lung is also not significantly changed. The previously seen small right effusion has resolved. The right lung is now clear. There is no pneumothorax.  CXR 04/22/21 Increasing LEFT effusion and associated airspace  disease. Correlate with any signs of infection. Given persistence/worsening underlying pulmonary or bronchial lesion would be difficult to exclude. Correlate with results of prior thoracentesis and consider follow-up chest CT for further evaluation as warranted.   Trace RIGHT effusion with basilar atelectasis.  CXR 03/06/21 Resolved right-sided pleural effusion with persistent opacity on the left, likely a combination of persisting pleural fluid and associated atelectasis/consolidation  CXR 01/30/21 1. Slight interval decrease in size of the right  pleural effusion with improved aeration of the right base. 2. No significant interval change of the larger left pleural effusion and adjacent airspace disease.  CT Chest 01/03/21 Moderate left and small right pleural effusions, which is increased on the left and new on the right in comparison to prior CT. Adjacent bibasilar and lingular atelectasis.   Increased, moderate-sized pericardial effusion. Biatrial enlargement.   Mild pulmonary edema.  PFT:     View : No data to display.          Labs: 01/21/21: Quantiferon gold indeterminate  Path:  Echo 01/20/21:  1. Left ventricular ejection fraction, by estimation, is 60 to 65%. The  left ventricle has normal function. The left ventricle has no regional  wall motion abnormalities.   2. A small pericardial effusion is present. There is no evidence of  cardiac tamponade.  Heart Catheterization:   Assessment & Plan:   Serositis [facility]) - Plan: DG Chest 2 View  Pleural effusion on left - Plan: DG Chest 2 View  Discussion: Rebecca Short is an 81 year old woman, never smoker with history of obstructive sleep apnea and atrial fibrillation who returns to pulmonary clinic for follow up of serositis and left pleural effusion.  She continues to improve physically. The left pleural effusion remains stable based on today's chest x-ray and the right lung remains clear.  Below is the planned steroid taper: 15mg  5/14 - 6/11 10mg  6/12 - 7/10  5mg   7/11 - 8/8 2.5mg  8/9 - 8/23  Follow-up in 4 weeks.  Freda Jackson, MD [facility] Office: [phone]   Current Outpatient Medications:    apixaban (ELIQUIS) 5 MG TABS tablet, TAKE 1 TABLET BY MOUTH TWICE A DAY, Disp: 60 tablet, Rfl: 6   azaTHIOprine (IMURAN) 50 MG tablet, TAKE 2 TABLETS BY MOUTH EVERY DAY, Disp: 180 tablet, Rfl: 1   CALCIUM CITRATE-VITAMIN D PO, Take 1 tablet by mouth at bedtime., Disp: , Rfl:    carboxymethylcellulose (REFRESH PLUS) 0.5 %  SOLN, Place 1 drop into both eyes 2 (two) times daily., Disp: , Rfl:    EPIPEN 2-PAK 0.3 MG/0.3ML SOAJ injection, Inject 0.3 mg into the muscle once as needed for anaphylaxis (severe allergic reaction)., Disp: , Rfl: 0   furosemide (LASIX) 20 MG tablet, Take 1 tablet (20 mg total) by mouth daily., Disp: 90 tablet, Rfl: 1   hydrocortisone 2.5 % cream, , Disp: , Rfl:    metoprolol tartrate (LOPRESSOR) 25 MG tablet, Take 0.5 tablets (12.5 mg total) by mouth daily. May increase to twice daily as tolerated. (Patient taking differently: Take 12.5 mg by mouth 2 (two) times daily.), Disp: 30 tablet, Rfl: 1   Multiple Vitamin (MULTIVITAMIN WITH MINERALS) TABS tablet, Take 1 tablet by mouth every morning., Disp: , Rfl:    Multiple Vitamins-Minerals (PRESERVISION AREDS PO), Take 1 capsule by mouth 2 (two) times daily., Disp: , Rfl:    pantoprazole (PROTONIX) 40 MG tablet, Take 1 tablet (40 mg total) by mouth daily at 12 noon. (Patient taking differently:  Take 40 mg by mouth daily.), Disp: 90 tablet, Rfl: 2   potassium chloride (KLOR-CON) 20 MEQ packet, USE 1 PACKET ONCE DAILY, Disp: 90 packet, Rfl: 2   predniSONE (DELTASONE) 20 MG tablet, Take 2 tablets (40 mg total) by mouth daily with breakfast. (Patient taking differently: Take 15 mg by mouth daily with breakfast.), Disp: 60 tablet, Rfl: 5   PRESCRIPTION MEDICATION, every 14 (fourteen) days. Allergy shots administered by Dr. Donneta Romberg, Disp: , Rfl:

## 2021-11-17 ENCOUNTER — Other Ambulatory Visit: Payer: Self-pay | Admitting: Interventional Cardiology

## 2021-11-25 ENCOUNTER — Other Ambulatory Visit: Payer: Self-pay | Admitting: Interventional Cardiology

## 2021-11-25 ENCOUNTER — Other Ambulatory Visit (INDEPENDENT_AMBULATORY_CARE_PROVIDER_SITE_OTHER): Payer: Medicare Other

## 2021-11-25 ENCOUNTER — Telehealth: Payer: Self-pay | Admitting: Pulmonary Disease

## 2021-11-25 ENCOUNTER — Ambulatory Visit (INDEPENDENT_AMBULATORY_CARE_PROVIDER_SITE_OTHER): Payer: Medicare Other

## 2021-11-25 DIAGNOSIS — J90 Pleural effusion, not elsewhere classified: Secondary | ICD-10-CM

## 2021-11-25 DIAGNOSIS — R5382 Chronic fatigue, unspecified: Secondary | ICD-10-CM

## 2021-11-25 LAB — CBC WITH DIFFERENTIAL/PLATELET
Basophils Absolute: 0 10*3/uL (ref 0.0–0.1)
Basophils Relative: 0.2 % (ref 0.0–3.0)
Eosinophils Absolute: 0 10*3/uL (ref 0.0–0.7)
Eosinophils Relative: 0 % (ref 0.0–5.0)
HCT: 40.6 % (ref 36.0–46.0)
Hemoglobin: 13.7 g/dL (ref 12.0–15.0)
Lymphocytes Relative: 4.7 % — ABNORMAL LOW (ref 12.0–46.0)
Lymphs Abs: 0.4 10*3/uL — ABNORMAL LOW (ref 0.7–4.0)
MCHC: 33.7 g/dL (ref 30.0–36.0)
MCV: 100.3 fl — ABNORMAL HIGH (ref 78.0–100.0)
Monocytes Absolute: 0.6 10*3/uL (ref 0.1–1.0)
Monocytes Relative: 7.1 % (ref 3.0–12.0)
Neutro Abs: 7.9 10*3/uL — ABNORMAL HIGH (ref 1.4–7.7)
Neutrophils Relative %: 88 % — ABNORMAL HIGH (ref 43.0–77.0)
Platelets: 260 10*3/uL (ref 150.0–400.0)
RBC: 4.05 Mil/uL (ref 3.87–5.11)
RDW: 16 % — ABNORMAL HIGH (ref 11.5–15.5)
WBC: 9 10*3/uL (ref 4.0–10.5)

## 2021-11-25 LAB — COMPREHENSIVE METABOLIC PANEL
ALT: 15 U/L (ref 0–35)
AST: 21 U/L (ref 0–37)
Albumin: 3.6 g/dL (ref 3.5–5.2)
Alkaline Phosphatase: 46 U/L (ref 39–117)
BUN: 12 mg/dL (ref 6–23)
CO2: 30 mEq/L (ref 19–32)
Calcium: 9.1 mg/dL (ref 8.4–10.5)
Chloride: 95 mEq/L — ABNORMAL LOW (ref 96–112)
Creatinine, Ser: 0.69 mg/dL (ref 0.40–1.20)
GFR: 81.19 mL/min (ref >60.00)
Glucose, Bld: 138 mg/dL — ABNORMAL HIGH (ref 70–99)
Potassium: 4.3 mEq/L (ref 3.5–5.1)
Sodium: 131 mEq/L — ABNORMAL LOW (ref 135–145)
Total Bilirubin: 0.6 mg/dL (ref 0.2–1.2)
Total Protein: 6.2 g/dL (ref 6.0–8.3)

## 2021-11-25 NOTE — Telephone Encounter (Signed)
Called and spoke with patient. She stated that she has not been feeling well for the past few days. She woke up extremely tired and SOB on 06/28. She figured she was dehydrated so she increased her fluids. She felt much better on 06/29. On 06/30, she started feeling the same way again. This time she noticed that her HR was elevated and the SOB had increased. She has no energy to do anything. Denied any chest pain or increased swelling.   She denied being around anyone who has been sick recently. Also denied having a fever. She confirmed that she is currently on prednisone 10mg  once daily.   I looked to see if I could get her scheduled for an appt today but we do not have any openings. She wanted to know if she needs another CXR.   Dr. , can you please advise? Thanks!

## 2021-11-25 NOTE — Telephone Encounter (Signed)
Called and spoke with patient. She verbalized understanding. She will come by the office this afternoon for the CXR and bloodwork.   Orders have been placed.   Nothing further needed at time of call.

## 2021-11-25 NOTE — Telephone Encounter (Signed)
Please have her come in for a chest x-ray 2 view and CMP and CBC w/ diff. I will follow up on these results and be in touch with her.   Thanks, JD

## 2021-11-26 ENCOUNTER — Inpatient Hospital Stay (HOSPITAL_COMMUNITY)
Admission: RE | Admit: 2021-11-26 | Discharge: 2021-11-29 | DRG: 546 | Disposition: A | Discharge status: Home / Self Care | Payer: Medicare Other | Source: Ambulatory Visit | Attending: Internal Medicine | Admitting: Internal Medicine

## 2021-11-26 ENCOUNTER — Other Ambulatory Visit: Payer: Self-pay

## 2021-11-26 DIAGNOSIS — K219 Gastro-esophageal reflux disease without esophagitis: Secondary | ICD-10-CM | POA: Diagnosis present

## 2021-11-26 DIAGNOSIS — Z882 Allergy status to sulfonamides status: Secondary | ICD-10-CM

## 2021-11-26 DIAGNOSIS — Z6831 Body mass index (BMI) 31.0-31.9, adult: Secondary | ICD-10-CM | POA: Diagnosis not present

## 2021-11-26 DIAGNOSIS — J9811 Atelectasis: Secondary | ICD-10-CM | POA: Diagnosis present

## 2021-11-26 DIAGNOSIS — T501X5A Adverse effect of loop [high-ceiling] diuretics, initial encounter: Secondary | ICD-10-CM | POA: Diagnosis not present

## 2021-11-26 DIAGNOSIS — Z96653 Presence of artificial knee joint, bilateral: Secondary | ICD-10-CM | POA: Diagnosis present

## 2021-11-26 DIAGNOSIS — E669 Obesity, unspecified: Secondary | ICD-10-CM | POA: Diagnosis present

## 2021-11-26 DIAGNOSIS — I3139 Other pericardial effusion (noninflammatory): Secondary | ICD-10-CM | POA: Diagnosis present

## 2021-11-26 DIAGNOSIS — M0579 Rheumatoid arthritis with rheumatoid factor of multiple sites without organ or systems involvement: Secondary | ICD-10-CM | POA: Diagnosis not present

## 2021-11-26 DIAGNOSIS — I959 Hypotension, unspecified: Secondary | ICD-10-CM | POA: Diagnosis not present

## 2021-11-26 DIAGNOSIS — H3530 Unspecified macular degeneration: Secondary | ICD-10-CM | POA: Diagnosis present

## 2021-11-26 DIAGNOSIS — I480 Paroxysmal atrial fibrillation: Secondary | ICD-10-CM | POA: Diagnosis present

## 2021-11-26 DIAGNOSIS — M069 Rheumatoid arthritis, unspecified: Principal | ICD-10-CM | POA: Diagnosis present

## 2021-11-26 DIAGNOSIS — I9589 Other hypotension: Secondary | ICD-10-CM | POA: Diagnosis not present

## 2021-11-26 DIAGNOSIS — E871 Hypo-osmolality and hyponatremia: Secondary | ICD-10-CM | POA: Diagnosis present

## 2021-11-26 DIAGNOSIS — I4821 Permanent atrial fibrillation: Secondary | ICD-10-CM | POA: Diagnosis present

## 2021-11-26 DIAGNOSIS — Z888 Allergy status to other drugs, medicaments and biological substances status: Secondary | ICD-10-CM | POA: Diagnosis not present

## 2021-11-26 DIAGNOSIS — Z7952 Long term (current) use of systemic steroids: Secondary | ICD-10-CM

## 2021-11-26 DIAGNOSIS — Z7901 Long term (current) use of anticoagulants: Secondary | ICD-10-CM

## 2021-11-26 DIAGNOSIS — Z9049 Acquired absence of other specified parts of digestive tract: Secondary | ICD-10-CM | POA: Diagnosis not present

## 2021-11-26 DIAGNOSIS — G4733 Obstructive sleep apnea (adult) (pediatric): Secondary | ICD-10-CM | POA: Diagnosis present

## 2021-11-26 DIAGNOSIS — J90 Pleural effusion, not elsewhere classified: Secondary | ICD-10-CM | POA: Diagnosis present

## 2021-11-26 DIAGNOSIS — I4819 Other persistent atrial fibrillation: Secondary | ICD-10-CM | POA: Diagnosis not present

## 2021-11-26 DIAGNOSIS — Z881 Allergy status to other antibiotic agents status: Secondary | ICD-10-CM

## 2021-11-26 LAB — CBC WITH DIFFERENTIAL/PLATELET
Abs Immature Granulocytes: 0.08 10*3/uL — ABNORMAL HIGH (ref 0.00–0.07)
Basophils Absolute: 0 10*3/uL (ref 0.0–0.1)
Basophils Relative: 0 %
Eosinophils Absolute: 0 10*3/uL (ref 0.0–0.5)
Eosinophils Relative: 0 %
HCT: 36 % (ref 36.0–46.0)
Hemoglobin: 12.7 g/dL (ref 12.0–15.0)
Immature Granulocytes: 1 %
Lymphocytes Relative: 9 %
Lymphs Abs: 0.6 10*3/uL — ABNORMAL LOW (ref 0.7–4.0)
MCH: 34.3 pg — ABNORMAL HIGH (ref 26.0–34.0)
MCHC: 35.3 g/dL (ref 30.0–36.0)
MCV: 97.3 fL (ref 80.0–100.0)
Monocytes Absolute: 0.7 10*3/uL (ref 0.1–1.0)
Monocytes Relative: 9 %
Neutro Abs: 6 10*3/uL (ref 1.7–7.7)
Neutrophils Relative %: 81 %
Platelets: 260 10*3/uL (ref 150–400)
RBC: 3.7 MIL/uL — ABNORMAL LOW (ref 3.87–5.11)
RDW: 14.7 % (ref 11.5–15.5)
WBC: 7.5 10*3/uL (ref 4.0–10.5)
nRBC: 0 % (ref 0.0–0.2)

## 2021-11-26 LAB — PROTIME-INR
INR: 1.1 (ref 0.8–1.2)
Prothrombin Time: 14.1 seconds (ref 11.4–15.2)

## 2021-11-26 LAB — COMPREHENSIVE METABOLIC PANEL
ALT: 15 U/L (ref 0–44)
AST: 17 U/L (ref 15–41)
Albumin: 2.7 g/dL — ABNORMAL LOW (ref 3.5–5.0)
Alkaline Phosphatase: 43 U/L (ref 38–126)
Anion gap: 12 (ref 5–15)
BUN: 12 mg/dL (ref 8–23)
CO2: 23 mmol/L (ref 22–32)
Calcium: 8.5 mg/dL — ABNORMAL LOW (ref 8.9–10.3)
Chloride: 99 mmol/L (ref 98–111)
Creatinine, Ser: 0.57 mg/dL (ref 0.44–1.00)
GFR, Estimated: >60 mL/min (ref >60)
Glucose, Bld: 110 mg/dL — ABNORMAL HIGH (ref 70–99)
Potassium: 4.1 mmol/L (ref 3.5–5.1)
Sodium: 134 mmol/L — ABNORMAL LOW (ref 135–145)
Total Bilirubin: 0.5 mg/dL (ref 0.3–1.2)
Total Protein: 5.2 g/dL — ABNORMAL LOW (ref 6.5–8.1)

## 2021-11-26 LAB — SEDIMENTATION RATE: Sed Rate: 34 mm/hr — ABNORMAL HIGH (ref 0–22)

## 2021-11-26 LAB — CK: Total CK: 14 U/L — ABNORMAL LOW (ref 38–234)

## 2021-11-26 LAB — TROPONIN I (HIGH SENSITIVITY): Troponin I (High Sensitivity): 6 ng/L (ref <18)

## 2021-11-26 LAB — PHOSPHORUS: Phosphorus: 3.2 mg/dL (ref 2.5–4.6)

## 2021-11-26 LAB — PROCALCITONIN: Procalcitonin: <0.1 ng/mL

## 2021-11-26 LAB — BRAIN NATRIURETIC PEPTIDE: B Natriuretic Peptide: 235.7 pg/mL — ABNORMAL HIGH (ref 0.0–100.0)

## 2021-11-26 LAB — MAGNESIUM: Magnesium: 2.2 mg/dL (ref 1.7–2.4)

## 2021-11-26 LAB — C-REACTIVE PROTEIN: CRP: 8.4 mg/dL — ABNORMAL HIGH (ref <1.0)

## 2021-11-26 MED ORDER — AZATHIOPRINE 50 MG PO TABS: 100.0000 mg | ORAL_TABLET | Freq: Every day | ORAL | Status: DC

## 2021-11-26 MED ORDER — METOPROLOL TARTRATE 12.5 MG HALF TABLET: 12.5000 mg | ORAL_TABLET | Freq: Two times a day (BID) | ORAL | Status: DC

## 2021-11-26 MED ORDER — LACTATED RINGERS IV SOLN
INTRAVENOUS | Status: AC
Start: 2021-11-26 — End: 2021-11-27

## 2021-11-26 MED ORDER — DOCUSATE SODIUM 100 MG PO CAPS: 100.0000 mg | ORAL_CAPSULE | Freq: Two times a day (BID) | ORAL | Status: DC | PRN

## 2021-11-26 MED ORDER — POLYETHYLENE GLYCOL 3350 17 G PO PACK: 17.0000 g | PACK | Freq: Every day | ORAL | Status: DC | PRN

## 2021-11-26 MED ORDER — ONDANSETRON HCL 4 MG/2ML IJ SOLN: 4.0000 mg | Freq: Four times a day (QID) | INTRAMUSCULAR | Status: DC | PRN

## 2021-11-26 MED ORDER — AZATHIOPRINE 50 MG PO TABS
100.0000 mg | ORAL_TABLET | Freq: Every day | ORAL | Status: DC
  Administered 2021-11-27 – 2021-11-29 (×3): 100 mg via ORAL
  Filled 2021-11-26 (×3): qty 2

## 2021-11-26 MED ORDER — METOPROLOL TARTRATE 25 MG PO TABS
25.0000 mg | ORAL_TABLET | Freq: Two times a day (BID) | ORAL | Status: DC
  Administered 2021-11-26 – 2021-11-28 (×4): 25 mg via ORAL
  Filled 2021-11-26 (×4): qty 1

## 2021-11-26 NOTE — H&P (Signed)
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 She was tapered down to 10mg  of prednisone daily on 6/12. She reports developing progressive fatigue and shortness of breath over the past week. She called the pulmonary office where a chest radiograph showed increase in left pleural effusion and small right pleural effusion. Given her return of symptoms and increase in effusion she has been directly admitted for chest tube placement and further evaluation.   She last took eliquis yesterday morning around 8am. She denies any fevers, chills or sweats. She denies chest pain, nausea or vomiting.   Pertinent  Medical History  Rheumatoid arthritis Chronic Left pleural effusion Atrial Fibrillation Pericardial Effusion s/p drainage 2022 OSA - mild  Significant Hospital Events: Including procedures, antibiotic start and stop dates in addition to other pertinent events   7/4 admitted  Interim History / Subjective:  As above  Objective   Blood pressure 111/83, pulse (!) 109, temperature 98.5 F (36.9 C), temperature source Axillary, resp. rate 20, SpO2 96 %.       No intake or output data in the 24 hours ending 11/26/21 1800 There were no vitals filed for this visit.  Examination: General: elderly woman, no acute distress HENT: [place]/AT, face mask in place Lungs: diminished breath sounds on left base. No wheezing. Cardiovascular: irregularly irregular, no murmurs Abdomen: soft, non-tender, non-distended,  Extremities: warm, no edema Neuro: alert, oriented, moving all extremities GU: no foley  Bedside 01/27/22 - moderate to large left pleural  effusion, appears simple  Resolved Hospital Problem list     Assessment & Plan:  Left Pleural Effusion Progressive Fatigue Mild Hyponatremia Rheumatoid Arthritis Atrial Fibrillation  Plan - We will place left pigtail chest tube tomorrow after holding eliquis for 48 hours - Increase metoprolol to 25mg  BID given increased HR - Plan to resume eliquis or lovenox injections after chest tube placement.  - Will determine need to pleural lytic therapy after chest tube placement - She is to continue 10mg  prednisone daily and 100mg  azathioprine daily for RA - Inflammatory work up ordered - Will send pleural fluid studies once chest tube placed - Start LR at 48mL for 12 hours  Best Practice (right click and "Reselect all SmartList Selections" daily)   Diet/type: Regular consistency (see orders) DVT prophylaxis: SCD GI prophylaxis: N/A Lines: N/A Foley:  N/A Code Status:  full code Last date of multidisciplinary goals of care discussion [n/a]  Labs   CBC: Recent Labs  Lab 11/25/21 1414  WBC 9.0  NEUTROABS 7.9*  HGB 13.7  HCT 40.6  MCV 100.3*  PLT 260.0    Basic Metabolic Panel: Recent Labs  Lab 11/25/21 1414  NA 131*  K 4.3  CL 95*  CO2 30  GLUCOSE 138*  BUN 12  CREATININE 0.69  CALCIUM 9.1   GFR: CrCl cannot be calculated (Unknown ideal weight.). Recent Labs  Lab 11/25/21 1414  WBC 9.0    Liver Function Tests: Recent Labs  Lab 11/25/21 1414  AST 21  ALT 15  ALKPHOS 46  BILITOT 0.6  PROT 6.2  ALBUMIN 3.6   No results for input(s): "LIPASE", "AMYLASE" in the last 168 hours.  No results for input(s): "AMMONIA" in the last 168 hours.  ABG No results found for: "PHART", "PCO2ART", "PO2ART", "HCO3", "TCO2", "ACIDBASEDEF", "O2SAT"   Coagulation Profile: No results for input(s): "INR", "PROTIME" in the last 168 hours.  Cardiac Enzymes: No results for input(s): "CKTOTAL", "CKMB", "CKMBINDEX", "TROPONINI" in the last 168 hours.  HbA1C: Hgb A1c MFr  Bld  Date/Time Value Ref Range Status  12/23/2020 10:12 PM 5.4 4.8 - 5.6 % Final    Comment:    (NOTE) Pre diabetes:          5.7%-6.4%  Diabetes:              >6.4%  Glycemic control for   <7.0% adults with diabetes     CBG: No results for input(s): "GLUCAP" in the last 168 hours.  Review of Systems:   Review of Systems  Constitutional:  Positive for malaise/fatigue. Negative for chills, fever and weight loss.  HENT:  Negative for congestion, sinus pain and sore throat.   Eyes: Negative.   Respiratory:  Positive for shortness of breath. Negative for cough, hemoptysis, sputum production and wheezing.   Cardiovascular:  Negative for chest pain, palpitations, orthopnea, claudication and leg swelling.  Gastrointestinal:  Negative for abdominal pain, heartburn, nausea and vomiting.  Genitourinary: Negative.   Musculoskeletal:  Negative for joint pain and myalgias.  Skin:  Negative for rash.  Neurological:  Negative for weakness.  Endo/Heme/Allergies: Negative.   Psychiatric/Behavioral: Negative.     Past Medical History:  She,  has a past medical history of A-fib (HCC), Allergy, Atrial fibrillation (HCC), FH: cholecystectomy (1997), Macular degeneration, and OSA (obstructive sleep apnea).   Surgical History:   Past Surgical History:  Procedure Laterality Date   CHOLECYSTECTOMY     PERICARDIOCENTESIS N/A 01/07/2021   Procedure: PERICARDIOCENTESIS;  Surgeon: Yvonne Kendall, MD;  Location: [facility] INVASIVE CV LAB;  Service: Cardiovascular;  Laterality: N/A;   REPLACEMENT TOTAL KNEE BILATERAL     THORACENTESIS N/A 01/25/2021   Procedure: THORACENTESIS;  Surgeon: Martina Sinner, MD;  Location: [facility] ENDOSCOPY;  Service: Pulmonary;  Laterality: N/A;   THORACENTESIS N/A 04/08/2021   Procedure: THORACENTESIS;  Surgeon: Martina Sinner, MD;  Location: [facility] ENDOSCOPY;  Service: Pulmonary;  Laterality: N/A;   THORACENTESIS N/A 04/24/2021   Procedure: THORACENTESIS;  Surgeon: Lupita Leash, MD;  Location: [facility] ENDOSCOPY;  Service: Cardiopulmonary;  Laterality: N/A;     Social History:   reports that she has never smoked. She has never used smokeless tobacco. She reports that she does not drink alcohol and does not use drugs.   Family History:  Her family history includes Cancer in her maternal grandmother; Dementia in her paternal grandmother; Diabetes in her brother; Heart Problems in her father; Kidney failure in her brother; Macular degeneration in her mother; Osteoporosis in her mother; Stroke in her father; Suicidality in an other family member. There is no history of Heart attack.   Allergies Allergies  Allergen Reactions   Erythromycin Other (See Comments)    Other reaction(s): burning   Sulfa Antibiotics     Other reaction(s): Unknown   Azithromycin Other (See Comments)    Pain in arm with IV infusion    Condrolite [Glucosamine-Chondroitin-Msm] Rash   Glucosamine Rash    Other reaction(s): rash Other reaction(s): rash   Sulfamethoxazole-Trimethoprim Rash    Other reaction(s): rash Other reaction(s): rash     Home Medications  Prior to Admission medications   Medication Sig Start Date End Date Taking? Authorizing Provider  apixaban (ELIQUIS) 5 MG TABS tablet TAKE 1 TABLET BY MOUTH TWICE A DAY Patient taking differently: Take 5 mg by mouth 2 (two) times daily. 09/19/21  Yes Corky Crafts, MD  azaTHIOprine (IMURAN) 50 MG tablet TAKE 2 TABLETS BY MOUTH EVERY DAY Patient taking differently: Take 100 mg by mouth daily. 08/01/21  Yes Rice, Jamesetta Orleans, MD  CALCIUM CITRATE-VITAMIN D PO Take 1 tablet by mouth at bedtime.   Yes [provider]  carboxymethylcellulose (REFRESH PLUS) 0.5 % SOLN Place 1 drop into both eyes 2 (two) times daily.   Yes [provider]  EPIPEN 2-PAK 0.3 MG/0.3ML SOAJ injection Inject 0.3 mg into the muscle once as needed for anaphylaxis (severe allergic reaction). 07/05/14  Yes [provider]   furosemide (LASIX) 20 MG tablet Take 1 tablet (20 mg total) by mouth daily. Please call [phone] to schedule an appointment for future refills. Thank you. 1st attempt. Patient taking differently: Take 20 mg by mouth daily. 11/25/21  Yes Corky Crafts, MD  metoprolol tartrate (LOPRESSOR) 25 MG tablet Take 0.5 tablets (12.5 mg total) by mouth daily. May increase to twice daily as tolerated. Patient taking differently: Take 12.5 mg by mouth 2 (two) times daily. 04/23/21  Yes Corky Crafts, MD  Multiple Vitamin (MULTIVITAMIN WITH MINERALS) TABS tablet Take 1 tablet by mouth every morning.   Yes [provider]  Multiple Vitamins-Minerals (PRESERVISION AREDS PO) Take 1 capsule by mouth 2 (two) times daily.   Yes [provider]  pantoprazole (PROTONIX) 40 MG tablet TAKE 1 TABLET BY MOUTH DAILY AT 12 NOON. Patient taking differently: Take 40 mg by mouth daily at 12 noon. 11/18/21  Yes Corky Crafts, MD  potassium chloride (KLOR-CON) 20 MEQ packet USE 1 PACKET ONCE DAILY Patient taking differently: Take 20 mEq by mouth daily. 10/11/21  Yes Corky Crafts, MD  predniSONE (DELTASONE) 10 MG tablet Take 10 mg by mouth daily with breakfast.   Yes [provider]  PRESCRIPTION MEDICATION every 14 (fourteen) days. Allergy shots administered by Dr. [place] Callas   Yes [provider]  predniSONE (DELTASONE) 20 MG tablet Take 2 tablets (40 mg total) by mouth daily with breakfast. Patient not taking: Reported on 11/26/2021 06/03/21   Martina Sinner, MD     Critical care time: n/a    Melody Comas, MD [facility] Office: [phone]   See Amion for personal pager PCCM on call pager [phone] until 7pm. Please call Elink 7p-7a. [phone]

## 2021-11-27 ENCOUNTER — Encounter (HOSPITAL_COMMUNITY)
Admission: RE | Disposition: A | Discharge status: Home / Self Care | Payer: Self-pay | Source: Ambulatory Visit | Attending: Internal Medicine

## 2021-11-27 DIAGNOSIS — E871 Hypo-osmolality and hyponatremia: Secondary | ICD-10-CM | POA: Diagnosis not present

## 2021-11-27 DIAGNOSIS — I4819 Other persistent atrial fibrillation: Secondary | ICD-10-CM | POA: Diagnosis not present

## 2021-11-27 DIAGNOSIS — J90 Pleural effusion, not elsewhere classified: Secondary | ICD-10-CM

## 2021-11-27 HISTORY — PX: CHEST TUBE INSERTION: SHX231

## 2021-11-27 LAB — PROTEIN, PLEURAL OR PERITONEAL FLUID: Total protein, fluid: 3.1 g/dL

## 2021-11-27 LAB — BODY FLUID CELL COUNT WITH DIFFERENTIAL
Eos, Fluid: 0 %
Lymphs, Fluid: 67 %
Monocyte-Macrophage-Serous Fluid: 8 % — ABNORMAL LOW (ref 50–90)
Neutrophil Count, Fluid: 25 % (ref 0–25)
Total Nucleated Cell Count, Fluid: 680 cu mm (ref 0–1000)

## 2021-11-27 LAB — LACTATE DEHYDROGENASE, PLEURAL OR PERITONEAL FLUID: LD, Fluid: 58 U/L — ABNORMAL HIGH (ref 3–23)

## 2021-11-27 LAB — GLUCOSE, PLEURAL OR PERITONEAL FLUID: Glucose, Fluid: 125 mg/dL

## 2021-11-27 LAB — AMYLASE, PLEURAL OR PERITONEAL FLUID: Amylase, Fluid: 12 U/L

## 2021-11-27 LAB — LACTATE DEHYDROGENASE: LDH: 130 U/L (ref 98–192)

## 2021-11-27 LAB — ALBUMIN, PLEURAL OR PERITONEAL FLUID: Albumin, Fluid: 2.1 g/dL

## 2021-11-27 LAB — PROTEIN, TOTAL: Total Protein: 5.1 g/dL — ABNORMAL LOW (ref 6.5–8.1)

## 2021-11-27 SURGERY — CHEST TUBE INSERTION
Laterality: Left

## 2021-11-27 MED ORDER — ACETAMINOPHEN 325 MG PO TABS: 650.0000 mg | ORAL_TABLET | Freq: Four times a day (QID) | ORAL | Status: DC | PRN

## 2021-11-27 MED ORDER — PREDNISONE 5 MG PO TABS
10.0000 mg | ORAL_TABLET | Freq: Every day | ORAL | Status: DC
Start: 2021-11-27 — End: 2021-11-28
  Administered 2021-11-27 – 2021-11-28 (×2): 10 mg via ORAL
  Filled 2021-11-27 (×2): qty 2

## 2021-11-27 MED ORDER — SODIUM CHLORIDE 0.9% FLUSH
10.0000 mL | Freq: Three times a day (TID) | INTRAVENOUS | Status: DC
  Administered 2021-11-28 – 2021-11-29 (×5): 10 mL

## 2021-11-27 MED ORDER — PANTOPRAZOLE SODIUM 40 MG PO TBEC
40.0000 mg | DELAYED_RELEASE_TABLET | Freq: Every day | ORAL | Status: DC
  Administered 2021-11-27 – 2021-11-29 (×3): 40 mg via ORAL
  Filled 2021-11-27 (×3): qty 1

## 2021-11-27 NOTE — Progress Notes (Signed)
  Transition of Care [facility]) Screening Note   Patient Details  Name: Rebecca Short Date of Birth: 01/23/1941   Transition of Care [facility]) CM/SW Contact:    Harriet Masson, RN Phone Number: 11/27/2021, 8:57 AM    Transition of Care Department [facility]) has reviewed patient and no TOC needs have been identified at this time. We will continue to monitor patient advancement through interdisciplinary progression rounds. If new patient transition needs arise, please place a TOC consult.

## 2021-11-27 NOTE — Progress Notes (Signed)
PROGRESS NOTE        PATIENT DETAILS Name: Rebecca Short Age: 81 y.o. Sex: female Date of Birth: 1940/07/04 Admit Date: 11/26/2021 Admitting Physician Martina Sinner, MD FIE:PPIR, Darlen Round, MD  Brief Summary: Patient is a 82 y.o.  female with history of RA on prednisone/azathioprine, atrial fibrillation on Eliquis-presented with shortness of breath-found to have a enlarging left pleural effusion.   Significant events: 7/4>> admit for symptomatic left pleural effusion by PCCM 7/5>> transfer to [facility]  Significant studies: 7/3>> CXR: Increased left pleural effusion, small right pleural effusion.  Significant microbiology data: 7/4>> blood culture: No growth  Procedures:   Consults: PCCM  Subjective: Lying comfortably in bed-denies any chest pain or shortness of breath.  Acknowledges shortness of breath with minimal movement.  Objective: Vitals: Blood pressure 127/78, pulse 95, temperature 98.1 F (36.7 C), temperature source Oral, resp. rate 20, SpO2 93 %.   Exam: Gen Exam:Alert awake-not in any distress HEENT:atraumatic, normocephalic Chest: Decreased air entry on the left lung field. CVS:S1S2 regular Abdomen:soft non tender, non distended Extremities:no edema Neurology: Non focal Skin: no rash  Pertinent Labs/Radiology:    Latest Ref Rng & Units 11/26/2021    6:17 PM 11/25/2021    2:14 PM 09/27/2021    4:22 PM  CBC  WBC 4.0 - 10.5 K/uL 7.5  9.0  8.4   Hemoglobin 12.0 - 15.0 g/dL 51.8  84.1  66.0   Hematocrit 36.0 - 46.0 % 36.0  40.6  43.4   Platelets 150 - 400 K/uL 260  260.0  245     Lab Results  Component Value Date   NA 134 (L) 11/26/2021   K 4.1 11/26/2021   CL 99 11/26/2021   CO2 23 11/26/2021      Assessment/Plan: Left pleural effusion: Symptomatic-with shortness of breath with minimal movement-PCCM following-plans are for chest tube placements once Eliquis washout completed.  Once pleural fluid analysis available-we  can then determine whether this is a transudate/exudate.  Rheumatoid arthritis: Continue azathioprine/prednisone.  Inflammatory/autoimmune panel pending.  Persistent atrial fibrillation: Rate controlled-continue metoprolol-holding Eliquis until chest tube can be completed.  Hyponatremia: Mild-stable for follow-up without any further work-up.  GERD: Continue PPI  Obesity: Estimated body mass index is 30.36 kg/m as calculated from the following:   Height as of 10/22/21: 5\' 2"  (1.575 m).   Weight as of 10/22/21: 75.3 kg.   Code status:   Code Status: Full Code   DVT Prophylaxis: SCDs Start: 11/26/21 1649   Family Communication: None at bedside   Disposition Plan: Status is: Inpatient Remains inpatient appropriate because: Symptomatic left pleural effusion-awaiting Eliquis washout before chest tube can be placed.   Planned Discharge Destination:Home   Diet: Diet Order             Diet regular Room service appropriate? Yes; Fluid consistency: Thin  Diet effective now                     Antimicrobial agents: Anti-infectives (From admission, onward)    None        MEDICATIONS: Scheduled Meds:  azaTHIOprine  100 mg Oral Daily   metoprolol tartrate  25 mg Oral BID   pantoprazole  40 mg Oral Q1200   predniSONE  10 mg Oral Q breakfast   Continuous Infusions: PRN Meds:.acetaminophen, docusate sodium, ondansetron (ZOFRAN) IV, polyethylene  glycol   I have personally reviewed following labs and imaging studies  LABORATORY DATA: CBC: Recent Labs  Lab 11/25/21 1414 11/26/21 1817  WBC 9.0 7.5  NEUTROABS 7.9* 6.0  HGB 13.7 12.7  HCT 40.6 36.0  MCV 100.3* 97.3  PLT 260.0 260    Basic Metabolic Panel: Recent Labs  Lab 11/25/21 1414 11/26/21 1817  NA 131* 134*  K 4.3 4.1  CL 95* 99  CO2 30 23  GLUCOSE 138* 110*  BUN 12 12  CREATININE 0.69 0.57  CALCIUM 9.1 8.5*  MG  --  2.2  PHOS  --  3.2    GFR: CrCl cannot be calculated (Unknown ideal  weight.).  Liver Function Tests: Recent Labs  Lab 11/25/21 1414 11/26/21 1817  AST 21 17  ALT 15 15  ALKPHOS 46 43  BILITOT 0.6 0.5  PROT 6.2 5.2*  ALBUMIN 3.6 2.7*   No results for input(s): "LIPASE", "AMYLASE" in the last 168 hours. No results for input(s): "AMMONIA" in the last 168 hours.  Coagulation Profile: Recent Labs  Lab 11/26/21 1817  INR 1.1    Cardiac Enzymes: Recent Labs  Lab 11/26/21 1817  CKTOTAL 14*    BNP (last 3 results) No results for input(s): "PROBNP" in the last 8760 hours.  Lipid Profile: No results for input(s): "CHOL", "HDL", "LDLCALC", "TRIG", "CHOLHDL", "LDLDIRECT" in the last 72 hours.  Thyroid Function Tests: No results for input(s): "TSH", "T4TOTAL", "FREET4", "T3FREE", "THYROIDAB" in the last 72 hours.  Anemia Panel: No results for input(s): "VITAMINB12", "FOLATE", "FERRITIN", "TIBC", "IRON", "RETICCTPCT" in the last 72 hours.  Urine analysis:    Component Value Date/Time   COLORURINE YELLOW 01/03/2021 1810   APPEARANCEUR HAZY (A) 01/03/2021 1810   LABSPEC 1.013 01/03/2021 1810   PHURINE 6.0 01/03/2021 1810   GLUCOSEU NEGATIVE 01/03/2021 1810   HGBUR NEGATIVE 01/03/2021 1810   BILIRUBINUR NEGATIVE 01/03/2021 1810   KETONESUR 20 (A) 01/03/2021 1810   PROTEINUR NEGATIVE 01/03/2021 1810   UROBILINOGEN 0.2 07/15/2013 1004   NITRITE NEGATIVE 01/03/2021 1810   LEUKOCYTESUR TRACE (A) 01/03/2021 1810    Sepsis Labs: Lactic Acid, Venous    Component Value Date/Time   LATICACIDVEN 1.6 01/19/2021 1803    MICROBIOLOGY: Recent Results (from the past 240 hour(s))  Culture, blood (Routine X 2) w Reflex to ID Panel     Status: None (Preliminary result)   Collection Time: 11/26/21  6:43 PM   Specimen: BLOOD  Result Value Ref Range Status   Specimen Description BLOOD SITE NOT SPECIFIED  Final   Special Requests   Final    BOTTLES DRAWN AEROBIC AND ANAEROBIC Blood Culture adequate volume   Culture   Final    NO GROWTH < 24  HOURS Performed at [facility], [address] [place], [place] [address]    Report Status PENDING  Incomplete  Culture, blood (Routine X 2) w Reflex to ID Panel     Status: None (Preliminary result)   Collection Time: 11/26/21  6:43 PM   Specimen: BLOOD  Result Value Ref Range Status   Specimen Description BLOOD SITE NOT SPECIFIED  Final   Special Requests   Final    BOTTLES DRAWN AEROBIC AND ANAEROBIC Blood Culture results may not be optimal due to an excessive volume of blood received in culture bottles   Culture   Final    NO GROWTH < 24 HOURS Performed at [facility], [address], [place], [place] [address]  Report Status PENDING  Incomplete    RADIOLOGY STUDIES/RESULTS: DG Chest 2 View  Result Date: 11/26/2021 CLINICAL DATA:  History of pleural effusion, increased shortness of breath over the past week. EXAM: CHEST - 2 VIEW COMPARISON:  Chest x-ray dated 10/22/2021. FINDINGS: Increased LEFT pleural effusion, now moderate to large in size. Probable small RIGHT pleural effusion. Heart size and mediastinal contours appear stable. No pneumothorax seen. No acute-appearing osseous abnormality. IMPRESSION: 1. Increased LEFT pleural effusion, now moderate to large in size. 2. Probable small RIGHT pleural effusion. Electronically Signed   By: Bary Richard M.D.   On: 11/26/2021 16:51     LOS: 1 day   Jeoffrey Massed, MD  [facility]    To contact the attending provider between 7A-7P or the covering provider during after hours 7P-7A, please log into the web site [email or web address] and access using universal [place] password for that web site. If you do not have the password, please call the hospital operator.  11/27/2021, 1:23 PM

## 2021-11-27 NOTE — Progress Notes (Signed)
NAME:  [name], MRN:  [id number], DOB:  06/26/1940, LOS: 1 ADMISSION DATE:  11/26/2021, CONSULTATION DATE:  11/26/21 REFERRING MD:  Francine Graven CHIEF COMPLAINT:  Pleural Effusion    History of Present Illness:  Rebecca Short is an 81 year old woman with atrial fibrillation on eliquis, rheumatoid arthritis, and chronic left pleural effusion related to RA on prolonged prednisone taper and azathioprine. She was tapered down to 10mg  of prednisone daily on 6/12. She reports developing progressive fatigue and shortness of breath over the past week. She called the pulmonary office where a chest radiograph showed increase in left pleural effusion and small right pleural effusion. Given her return of symptoms and increase in effusion she has been directly admitted for chest tube placement and further evaluation.   She last took eliquis yesterday morning around 8am. She denies any fevers, chills or sweats. She denies chest pain, nausea or vomiting.   Pertinent  Medical History  Rheumatoid arthritis Chronic Left pleural effusion Atrial Fibrillation Pericardial Effusion s/p drainage 2022 OSA - mild  Significant Hospital Events: Including procedures, antibiotic start and stop dates in addition to other pertinent events   7/4 admitted  Interim History / Subjective:  No acute events overnight. Chest tube placed in endo suite today, 2023 of fluid removed She is having some left shoulder/back discomfort.  Objective   Blood pressure 122/81, pulse 98, temperature 98.2 F (36.8 C), temperature source Oral, resp. rate 20, SpO2 95 %.        Intake/Output Summary (Last 24 hours) at 11/27/2021 1820 Last data filed at 11/27/2021 1445 Gross per 24 hour  Intake 720 ml  Output --  Net 720 ml   There were no vitals filed for this visit.  Examination: General: elderly woman, no acute distress HENT: [place]/AT, face mask in place Lungs: diminished breath sounds on left base. No wheezing. Chest tube in place, no air  leak.  Cardiovascular: irregularly irregular, no murmurs Abdomen: soft, non-tender, non-distended,  Extremities: warm, no edema Neuro: alert, oriented, moving all extremities GU: no foley  Bedside 01/28/2022 7/4 - moderate to large left pleural effusion, appears simple  Resolved Hospital Problem list     Assessment & Plan:  Left Pleural Effusion Progressive Fatigue Mild Hyponatremia Rheumatoid Arthritis Atrial Fibrillation  Plan - Left pigtail chest tube to suction -20cmH2O - Continue metoprolol to 25mg  BID - Plan to resume eliquis tomorrow after CT chest scan results - Will determine need to pleural lytic therapy after CT chest results - She is to continue 10mg  prednisone daily and 100mg  azathioprine daily for RA - Inflammatory work up pending - Pleural fluid studies sent  Best Practice (right click and "Reselect all SmartList Selections" daily)   Diet/type: Regular consistency (see orders) DVT prophylaxis: SCD GI prophylaxis: N/A Lines: N/A Foley:  N/A Code Status:  full code Last date of multidisciplinary goals of care discussion [n/a]  Labs   CBC: Recent Labs  Lab 11/25/21 1414 11/26/21 1817  WBC 9.0 7.5  NEUTROABS 7.9* 6.0  HGB 13.7 12.7  HCT 40.6 36.0  MCV 100.3* 97.3  PLT 260.0 260     Basic Metabolic Panel: Recent Labs  Lab 11/25/21 1414 11/26/21 1817  NA 131* 134*  K 4.3 4.1  CL 95* 99  CO2 30 23  GLUCOSE 138* 110*  BUN 12 12  CREATININE 0.69 0.57  CALCIUM 9.1 8.5*  MG  --  2.2  PHOS  --  3.2    GFR: CrCl cannot be calculated (  Unknown ideal weight.). Recent Labs  Lab 11/25/21 1414 11/26/21 1817  PROCALCITON  --  <0.10  WBC 9.0 7.5     Liver Function Tests: Recent Labs  Lab 11/25/21 1414 11/26/21 1817  AST 21 17  ALT 15 15  ALKPHOS 46 43  BILITOT 0.6 0.5  PROT 6.2 5.2*  ALBUMIN 3.6 2.7*    No results for input(s): "LIPASE", "AMYLASE" in the last 168 hours. No results for input(s): "AMMONIA" in the last 168  hours.  ABG No results found for: "PHART", "PCO2ART", "PO2ART", "HCO3", "TCO2", "ACIDBASEDEF", "O2SAT"   Coagulation Profile: Recent Labs  Lab 11/26/21 1817  INR 1.1    Cardiac Enzymes: Recent Labs  Lab 11/26/21 1817  CKTOTAL 14*    HbA1C: Hgb A1c MFr Bld  Date/Time Value Ref Range Status  12/23/2020 10:12 PM 5.4 4.8 - 5.6 % Final    Comment:    (NOTE) Pre diabetes:          5.7%-6.4%  Diabetes:              >6.4%  Glycemic control for   <7.0% adults with diabetes     CBG: No results for input(s): "GLUCAP" in the last 168 hours.   Critical care time: n/a    Melody Comas, MD [facility] Office: [phone]   See Amion for personal pager PCCM on call pager [phone] until 7pm. Please call Elink 7p-7a. [phone]

## 2021-11-27 NOTE — Op Note (Signed)
Insertion of Chest Tube Procedure Note  NYA MONDS  [id number]  Sep 18, 1940  Date:11/27/21  Time:3:00 PM    Provider Performing: Martina Sinner   Procedure: Chest Tube Insertion [id number])  Indication(s) Effusion  Consent Risks of the procedure as well as the alternatives and risks of each were explained to the patient and/or caregiver.  Consent for the procedure was obtained and is signed in the bedside chart  Anesthesia Topical only with 1% lidocaine    Time Out Verified patient identification, verified procedure, site/side was marked, verified correct patient position, special equipment/implants available, medications/allergies/relevant history reviewed, required imaging and test results available.   Sterile Technique Maximal sterile technique including full sterile barrier drape, hand hygiene, sterile gown, sterile gloves, mask, hair covering, sterile ultrasound probe cover (if used).   Procedure Description Ultrasound used to identify appropriate pleural anatomy for placement and overlying skin marked. Area of placement cleaned and draped in sterile fashion.  A 14 French pigtail pleural catheter was placed into the left pleural space using Seldinger technique. Appropriate return of fluid was obtained.  The tube was connected to atrium and placed on -20 cm H2O wall suction.   Complications/Tolerance None; patient tolerated the procedure well. Chest X-ray is ordered to verify placement.   EBL Minimal  Specimen(s) none

## 2021-11-27 NOTE — Progress Notes (Signed)
Patient has home CPAP at bedside. 

## 2021-11-27 NOTE — Progress Notes (Signed)
RT setup patients home CPAP at bedside within reach.

## 2021-11-28 ENCOUNTER — Inpatient Hospital Stay (HOSPITAL_COMMUNITY): Payer: Medicare Other

## 2021-11-28 ENCOUNTER — Encounter (HOSPITAL_COMMUNITY): Payer: Self-pay | Admitting: Pulmonary Disease

## 2021-11-28 ENCOUNTER — Telehealth: Payer: Self-pay | Admitting: Internal Medicine

## 2021-11-28 ENCOUNTER — Telehealth: Payer: Self-pay | Admitting: Interventional Cardiology

## 2021-11-28 DIAGNOSIS — J90 Pleural effusion, not elsewhere classified: Secondary | ICD-10-CM | POA: Diagnosis not present

## 2021-11-28 LAB — CBC
HCT: 37.5 % (ref 36.0–46.0)
Hemoglobin: 12.6 g/dL (ref 12.0–15.0)
MCH: 33.6 pg (ref 26.0–34.0)
MCHC: 33.6 g/dL (ref 30.0–36.0)
MCV: 100 fL (ref 80.0–100.0)
Platelets: 261 10*3/uL (ref 150–400)
RBC: 3.75 MIL/uL — ABNORMAL LOW (ref 3.87–5.11)
RDW: 14.7 % (ref 11.5–15.5)
WBC: 5.8 10*3/uL (ref 4.0–10.5)
nRBC: 0 % (ref 0.0–0.2)

## 2021-11-28 LAB — ANCA TITERS
Atypical P-ANCA titer: <1:20 {titer}
C-ANCA: <1:20 {titer}
P-ANCA: <1:20 {titer}

## 2021-11-28 LAB — BASIC METABOLIC PANEL
Anion gap: 6 (ref 5–15)
BUN: 12 mg/dL (ref 8–23)
CO2: 30 mmol/L (ref 22–32)
Calcium: 8.6 mg/dL — ABNORMAL LOW (ref 8.9–10.3)
Chloride: 102 mmol/L (ref 98–111)
Creatinine, Ser: 0.79 mg/dL (ref 0.44–1.00)
GFR, Estimated: >60 mL/min (ref >60)
Glucose, Bld: 89 mg/dL (ref 70–99)
Potassium: 3.7 mmol/L (ref 3.5–5.1)
Sodium: 138 mmol/L (ref 135–145)

## 2021-11-28 LAB — CYTOLOGY - NON PAP

## 2021-11-28 LAB — CENTROMERE ANTIBODIES: Centromere Ab Screen: <0.2 AI (ref 0.0–0.9)

## 2021-11-28 LAB — SJOGRENS SYNDROME-B EXTRACTABLE NUCLEAR ANTIBODY: SSB (La) (ENA) Antibody, IgG: <0.2 AI (ref 0.0–0.9)

## 2021-11-28 LAB — ALDOLASE: Aldolase: 12.3 U/L — ABNORMAL HIGH (ref 3.3–10.3)

## 2021-11-28 LAB — SJOGRENS SYNDROME-A EXTRACTABLE NUCLEAR ANTIBODY: SSA (Ro) (ENA) Antibody, IgG: <0.2 AI (ref 0.0–0.9)

## 2021-11-28 LAB — TRIGLYCERIDES, BODY FLUIDS: Triglycerides, Fluid: 19 mg/dL

## 2021-11-28 LAB — CYCLIC CITRUL PEPTIDE ANTIBODY, IGG/IGA: CCP Antibodies IgG/IgA: 4 units (ref 0–19)

## 2021-11-28 LAB — LYME DISEASE SEROLOGY W/REFLEX: Lyme Total Antibody EIA: NEGATIVE

## 2021-11-28 LAB — ANTI-SMOOTH MUSCLE ANTIBODY, IGG: F-Actin IgG: 42 Units — ABNORMAL HIGH (ref 0–19)

## 2021-11-28 LAB — ANTI-SMITH ANTIBODY: ENA SM Ab Ser-aCnc: <0.2 AI (ref 0.0–0.9)

## 2021-11-28 LAB — RHEUMATOID FACTOR: Rheumatoid fact SerPl-aCnc: 35.3 IU/mL — ABNORMAL HIGH (ref <14.0)

## 2021-11-28 MED ORDER — METOPROLOL TARTRATE 12.5 MG HALF TABLET
12.5000 mg | ORAL_TABLET | Freq: Two times a day (BID) | ORAL | Status: DC
Start: 2021-11-28 — End: 2021-11-29
  Administered 2021-11-28 – 2021-11-29 (×2): 12.5 mg via ORAL
  Filled 2021-11-28 (×2): qty 1

## 2021-11-28 MED ORDER — PREDNISONE 20 MG PO TABS
20.0000 mg | ORAL_TABLET | Freq: Every day | ORAL | Status: DC
  Administered 2021-11-29: 20 mg via ORAL
  Filled 2021-11-28: qty 1

## 2021-11-28 MED ORDER — IOHEXOL 300 MG/ML  SOLN
75.0000 mL | Freq: Once | INTRAMUSCULAR | Status: AC | PRN
  Administered 2021-11-28: 75 mL via INTRAVENOUS

## 2021-11-28 MED ORDER — PREDNISONE 5 MG PO TABS
10.0000 mg | ORAL_TABLET | Freq: Once | ORAL | Status: AC
  Administered 2021-11-28: 10 mg via ORAL
  Filled 2021-11-28: qty 2

## 2021-11-28 MED ORDER — FUROSEMIDE 10 MG/ML IJ SOLN
40.0000 mg | Freq: Once | INTRAMUSCULAR | Status: AC
  Administered 2021-11-28: 40 mg via INTRAVENOUS
  Filled 2021-11-28: qty 4

## 2021-11-28 MED ORDER — APIXABAN 5 MG PO TABS
5.0000 mg | ORAL_TABLET | Freq: Two times a day (BID) | ORAL | Status: DC
  Administered 2021-11-28 – 2021-11-29 (×3): 5 mg via ORAL
  Filled 2021-11-28 (×3): qty 1

## 2021-11-28 MED ORDER — LACTATED RINGERS IV BOLUS
1000.0000 mL | Freq: Once | INTRAVENOUS | Status: AC
  Administered 2021-11-28: 1000 mL via INTRAVENOUS

## 2021-11-28 NOTE — Telephone Encounter (Signed)
Patient calling back. She states she wants someone from the office to see her in the hospital to be a part of her care there. She states there are things being changed by the hospital that the office should be doing. She says she does not need a call back.

## 2021-11-28 NOTE — Progress Notes (Signed)
PROGRESS NOTE        PATIENT DETAILS Name: CECILLIA Short Age: 81 y.o. Sex: female Date of Birth: Mar 28, 1941 Admit Date: 11/26/2021 Admitting Physician Martina Sinner, MD EQA:[facility], Darlen Round, MD  Brief Summary: Patient is a 81 y.o.  female with history of RA on prednisone/azathioprine, atrial fibrillation on Eliquis-presented with shortness of breath-found to have a enlarging left pleural effusion.   Significant events: 7/4>> admit for symptomatic left pleural effusion by PCCM 7/5>> transfer to [facility]  Significant studies: 7/3>> CXR: Increased left pleural effusion, small right pleural effusion. 7/6>> CT chest: Small bilateral pleural effusions-right> left-atelectasis, small pericardial effusion, distended pulmonary trunk.   Significant microbiology data: 7/4>> blood culture: No growth 7/5>> pleural fluid culture: No growth  Procedures: 7/5>> chest tube placement by PCCM  Consults: PCCM  Subjective: Feels better after chest tube was placed.  Objective: Vitals: Blood pressure (!) 117/50, pulse 95, temperature 98 F (36.7 C), temperature source Oral, resp. rate (!) 21, SpO2 100 %.   Exam: Gen Exam:Alert awake-not in any distress HEENT:atraumatic, normocephalic Chest: B/L clear to auscultation anteriorly CVS:S1S2 regular Abdomen:soft non tender, non distended Extremities:no edema Neurology: Non focal Skin: no rash   Pertinent Labs/Radiology:    Latest Ref Rng & Units 11/28/2021    5:33 AM 11/26/2021    6:17 PM 11/25/2021    2:14 PM  CBC  WBC 4.0 - 10.5 K/uL 5.8  7.5  9.0   Hemoglobin 12.0 - 15.0 g/dL 96.2  22.9  79.8   Hematocrit 36.0 - 46.0 % 37.5  36.0  40.6   Platelets 150 - 400 K/uL 261  260  260.0     Lab Results  Component Value Date   NA 138 11/28/2021   K 3.7 11/28/2021   CL 102 11/28/2021   CO2 30 11/28/2021      Assessment/Plan: Left pleural effusion: S/p chest tube placement by PCCM on 7/5-CT chest without any major  findings-we will await further recommendations. .  Rheumatoid arthritis: Continue azathioprine/prednisone.  Inflammatory/autoimmune panel pending.  Persistent atrial fibrillation: Rate controlled-continue metoprolol-Eliquis resumed on 7/6  Pericardial effusion: Seen incidentally on CT chest-appears to be small-given prior history pericardiocentesis-we will assess degree of effusion by a limited echo.  Hyponatremia: Resolved.  GERD: Continue PPI  Obesity: Estimated body mass index is 30.36 kg/m as calculated from the following:   Height as of 10/22/21: 5\' 2"  (1.575 m).   Weight as of 10/22/21: 75.3 kg.   Code status:   Code Status: Full Code   DVT Prophylaxis: SCDs Start: 11/26/21 1649 apixaban (ELIQUIS) tablet 5 mg    Family Communication: None at bedside   Disposition Plan: Status is: Inpatient Remains inpatient appropriate because: Symptomatic left pleural effusion-awaiting Eliquis washout before chest tube can be placed.   Planned Discharge Destination:Home   Diet: Diet Order             Diet regular Room service appropriate? Yes; Fluid consistency: Thin  Diet effective now                     Antimicrobial agents: Anti-infectives (From admission, onward)    None        MEDICATIONS: Scheduled Meds:  apixaban  5 mg Oral BID   azaTHIOprine  100 mg Oral Daily   metoprolol tartrate  12.5 mg Oral BID  pantoprazole  40 mg Oral Q1200   [START ON 11/29/2021] predniSONE  20 mg Oral Q breakfast   sodium chloride flush  10 mL Other Q8H   Continuous Infusions: PRN Meds:.acetaminophen, docusate sodium, ondansetron (ZOFRAN) IV, polyethylene glycol   I have personally reviewed following labs and imaging studies  LABORATORY DATA: CBC: Recent Labs  Lab 11/25/21 1414 11/26/21 1817 11/28/21 0533  WBC 9.0 7.5 5.8  NEUTROABS 7.9* 6.0  --   HGB 13.7 12.7 12.6  HCT 40.6 36.0 37.5  MCV 100.3* 97.3 100.0  PLT 260.0 260 261     Basic Metabolic  Panel: Recent Labs  Lab 11/25/21 1414 11/26/21 1817 11/28/21 0533  NA 131* 134* 138  K 4.3 4.1 3.7  CL 95* 99 102  CO2 30 23 30   GLUCOSE 138* 110* 89  BUN 12 12 12   CREATININE 0.69 0.57 0.79  CALCIUM 9.1 8.5* 8.6*  MG  --  2.2  --   PHOS  --  3.2  --      GFR: CrCl cannot be calculated (Unknown ideal weight.).  Liver Function Tests: Recent Labs  Lab 11/25/21 1414 11/26/21 1817 11/27/21 1338  AST 21 17  --   ALT 15 15  --   ALKPHOS 46 43  --   BILITOT 0.6 0.5  --   PROT 6.2 5.2* 5.1*  ALBUMIN 3.6 2.7*  --     No results for input(s): "LIPASE", "AMYLASE" in the last 168 hours. No results for input(s): "AMMONIA" in the last 168 hours.  Coagulation Profile: Recent Labs  Lab 11/26/21 1817  INR 1.1     Cardiac Enzymes: Recent Labs  Lab 11/26/21 1817  CKTOTAL 14*     BNP (last 3 results) No results for input(s): "PROBNP" in the last 8760 hours.  Lipid Profile: No results for input(s): "CHOL", "HDL", "LDLCALC", "TRIG", "CHOLHDL", "LDLDIRECT" in the last 72 hours.  Thyroid Function Tests: No results for input(s): "TSH", "T4TOTAL", "FREET4", "T3FREE", "THYROIDAB" in the last 72 hours.  Anemia Panel: No results for input(s): "VITAMINB12", "FOLATE", "FERRITIN", "TIBC", "IRON", "RETICCTPCT" in the last 72 hours.  Urine analysis:    Component Value Date/Time   COLORURINE YELLOW 01/03/2021 1810   APPEARANCEUR HAZY (A) 01/03/2021 1810   LABSPEC 1.013 01/03/2021 1810   PHURINE 6.0 01/03/2021 1810   GLUCOSEU NEGATIVE 01/03/2021 1810   HGBUR NEGATIVE 01/03/2021 1810   BILIRUBINUR NEGATIVE 01/03/2021 1810   KETONESUR 20 (A) 01/03/2021 1810   PROTEINUR NEGATIVE 01/03/2021 1810   UROBILINOGEN 0.2 07/15/2013 1004   NITRITE NEGATIVE 01/03/2021 1810   LEUKOCYTESUR TRACE (A) 01/03/2021 1810    Sepsis Labs: Lactic Acid, Venous    Component Value Date/Time   LATICACIDVEN 1.6 01/19/2021 1803    MICROBIOLOGY: Recent Results (from the past 240 hour(s))   Culture, blood (Routine X 2) w Reflex to ID Panel     Status: None (Preliminary result)   Collection Time: 11/26/21  6:43 PM   Specimen: BLOOD  Result Value Ref Range Status   Specimen Description BLOOD SITE NOT SPECIFIED  Final   Special Requests   Final    BOTTLES DRAWN AEROBIC AND ANAEROBIC Blood Culture adequate volume   Culture   Final    NO GROWTH 2 DAYS Performed at [facility], [address] [place], [address] [place]    Report Status PENDING  Incomplete  Culture, blood (Routine X 2) w Reflex to ID Panel     Status: None (Preliminary result)  Collection Time: 11/26/21  6:43 PM   Specimen: BLOOD  Result Value Ref Range Status   Specimen Description BLOOD SITE NOT SPECIFIED  Final   Special Requests   Final    BOTTLES DRAWN AEROBIC AND ANAEROBIC Blood Culture results may not be optimal due to an excessive volume of blood received in culture bottles   Culture   Final    NO GROWTH 2 DAYS Performed at [facility], [address], [place], [place] [address]    Report Status PENDING  Incomplete  Body fluid culture w Gram Stain     Status: None (Preliminary result)   Collection Time: 11/27/21  1:37 PM   Specimen: Pleural Fluid  Result Value Ref Range Status   Specimen Description PLEURAL  Final   Special Requests NONE  Final   Gram Stain   Final    WBC PRESENT,BOTH PMN AND MONONUCLEAR NO ORGANISMS SEEN CYTOSPIN SMEAR    Culture   Final    NO GROWTH < 24 HOURS Performed at [facility], 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 History of rheumatoid arthritis. EXAM: CT CHEST WITH CONTRAST TECHNIQUE: Multidetector CT imaging of the chest was performed during intravenous contrast administration. RADIATION DOSE REDUCTION: This exam was performed according to the  departmental dose-optimization program which includes automated exposure control, adjustment of the mA and/or kV according to patient size and/or use of iterative reconstruction technique. CONTRAST:  42mL OMNIPAQUE IOHEXOL 300 MG/ML  SOLN COMPARISON:  04/29/2021. FINDINGS: Cardiovascular: The heart is enlarged and there is a small pericardial effusion. There is atherosclerotic calcification of the aorta without evidence of aneurysm. The pulmonary trunk is distended which may be associated with underlying pulmonary artery hypertension. Mediastinum/Nodes: No mediastinal, hilar, or axillary lymphadenopathy. A calcified lymph node is noted at in the hilar region on the left. The thyroid gland, trachea, and esophagus are within normal limits. Lungs/Pleura: Small bilateral pleural effusions are noted, greater on the left than on the right. Strandy airspace disease is noted bilaterally, greater on the left than on the right. No pneumothorax. A left-sided chest drain is in place. Upper Abdomen: Fatty infiltration of the liver is noted. The gallbladder is surgically absent. Scattered hypodensities are present in the spleen, possible cysts. A cyst is present in the upper pole of the left kidney. Musculoskeletal: Degenerative changes are present in the thoracic spine. No acute osseous abnormality. IMPRESSION: 1. Small bilateral pleural effusions, greater on the right than on the left, with a left chest drain in place and not significantly changed from the prior exam. 2. Stable airspace disease is noted bilaterally, possible atelectasis. 3. Cardiomegaly with small pericardial effusion and coronary artery calcifications. 4. Distended pulmonary trunk suggesting underlying pulmonary artery hypertension. 5. Aortic atherosclerosis. Electronically Signed   By: Thornell Sartorius M.D.   On: 11/28/2021 03:50     LOS: 2 days   Jeoffrey Massed, MD  [facility]    To contact the attending provider between 7A-7P or the covering  provider during after hours 7P-7A, please log into the web site [email or web address] and access using universal [place] password for that web site. If you do not have the password, please call the hospital operator.  11/28/2021, 3:06 PM

## 2021-11-28 NOTE — Telephone Encounter (Signed)
Patient called to let Dr. Dimple Casey know that she was admitted to [facility] due to inflammation in her lungs and Dr. Francine Graven had to put in a chest tube.  Patient states "Dr. Dimple Casey needs to be a part of all of this."  Patient states Dr. Francine Graven plans to consult with Dr. Dimple Casey regarding labwork.

## 2021-11-28 NOTE — Telephone Encounter (Signed)
   Pt c/o medication issue:  1. Name of Medication:   metoprolol tartrate (LOPRESSOR) tablet 12.5 mg    2. How are you currently taking this medication (dosage and times per day)? 12.5 mg, Oral, 2 times daily, (BETA BLOCKER)  3. Are you having a reaction (difficulty breathing--STAT)?   4. What is your medication issue? Pt said, she wanted to let Dr. Eldridge Dace knows that she is in the hospital and they increased her metoprolol and wanted to get what Dr. Hoyle Barr thoughts and recommendations

## 2021-11-28 NOTE — Progress Notes (Signed)
NAME:  Rebecca Short, MRN:  [id number], DOB:  1940-09-13, LOS: 2 ADMISSION DATE:  11/26/2021, CONSULTATION DATE:  11/26/21 REFERRING MD:  Francine Graven CHIEF COMPLAINT:  Pleural Effusion    History of Present Illness:  Rebecca Short is an 81 year old woman with atrial fibrillation on eliquis, rheumatoid arthritis, and chronic left pleural effusion related to RA on prolonged prednisone taper and azathioprine. She was tapered down to 10mg  of prednisone daily on 6/12. She reports developing progressive fatigue and shortness of breath over the past week. She called the pulmonary office where a chest radiograph showed increase in left pleural effusion and small right pleural effusion. Given her return of symptoms and increase in effusion she has been directly admitted for chest tube placement and further evaluation.   She last took eliquis yesterday morning around 8am. She denies any fevers, chills or sweats. She denies chest pain, nausea or vomiting.   Pertinent  Medical History  Rheumatoid arthritis Chronic Left pleural effusion Atrial Fibrillation Pericardial Effusion s/p drainage 2022 OSA - mild  Significant Hospital Events: Including procedures, antibiotic start and stop dates in addition to other pertinent events   7/4 admitted 7/5 chest tube placed  Interim History / Subjective:  No acute events overnight.  Chest tube has put out just over 9/5 in total.  CT Chest reviewed, there is improvement in the left lower lobe with lung expansion. There is atelectasis of the left lower lobe.   Tried some diuresis today but patient became hypotensive and was give bolus then another slowly over a few hours with good response. He rmetoprolol dose was also increased which likely added to her hypotension. She was not bradycardic.   Objective   Blood pressure 121/65, pulse (!) 104, temperature 99.5 F (37.5 C), temperature source Oral, resp. rate 18, SpO2 97 %.        Intake/Output Summary  (Last 24 hours) at 11/28/2021 1755 Last data filed at 11/28/2021 0827 Gross per 24 hour  Intake 360 ml  Output 610 ml  Net -250 ml   There were no vitals filed for this visit.  Examination: General: elderly woman, moderate distress, pale appearing HENT: [place]/AT Lungs: clear lung sounds. No wheezing. Chest tube in place, no air leak.  Cardiovascular: irregularly irregular, no murmurs Abdomen: soft, non-tender, non-distended,  Extremities: warm, no edema Neuro: alert, oriented, moving all extremities GU: no foley  Bedside 01/29/2022 7/4 - moderate to large left pleural effusion, appears simple  CT Chest w/ contrast 7/6 1. Small bilateral pleural effusions, greater on the right than on the left, with a left chest drain in place and not significantly changed from the prior exam. 2. Stable airspace disease is noted bilaterally, possible atelectasis. 3. Cardiomegaly with small pericardial effusion and coronary artery calcifications. 4. Distended pulmonary trunk suggesting underlying pulmonary artery hypertension. 5. Aortic atherosclerosis.   Resolved Hospital Problem list     Assessment & Plan:  Left Pleural Effusion, exudative Progressive Fatigue Mild Hyponatremia Rheumatoid Arthritis Atrial Fibrillation  Plan - Left pigtail chest tube to suction -20cmH2O - Return to home metoprolol to 12.5mg  BID - Eliquis resumed today - No requirement for pleural lytic therapy after CT chest results - Increased prednisone to 20mg  daily - Continue 100mg  azathioprine daily. I have reached out to Dr. 9/6 of rheumatology for further recommendations - Inflammatory labs are pending. RF is elevated at 35.3. Will follow up in outpatient clinic.  - Follow up pleural fluid cultures and ADA level - Quantiferon  gold pending - If chest tube has less than output, then can likely be removed 7/7.   PCCM to continue to follow.  Best Practice (right click and "Reselect all SmartList Selections" daily)    Diet/type: Regular consistency (see orders) DVT prophylaxis: SCD GI prophylaxis: N/A Lines: N/A Foley:  N/A Code Status:  full code Last date of multidisciplinary goals of care discussion [n/a]  Labs   CBC: Recent Labs  Lab 11/25/21 1414 11/26/21 1817 11/28/21 0533  WBC 9.0 7.5 5.8  NEUTROABS 7.9* 6.0  --   HGB 13.7 12.7 12.6  HCT 40.6 36.0 37.5  MCV 100.3* 97.3 100.0  PLT 260.0 260 261    Basic Metabolic Panel: Recent Labs  Lab 11/25/21 1414 11/26/21 1817 11/28/21 0533  NA 131* 134* 138  K 4.3 4.1 3.7  CL 95* 99 102  CO2 30 23 30   GLUCOSE 138* 110* 89  BUN 12 12 12   CREATININE 0.69 0.57 0.79  CALCIUM 9.1 8.5* 8.6*  MG  --  2.2  --   PHOS  --  3.2  --    GFR: CrCl cannot be calculated (Unknown ideal weight.). Recent Labs  Lab 11/25/21 1414 11/26/21 1817 11/28/21 0533  PROCALCITON  --  <0.10  --   WBC 9.0 7.5 5.8    Liver Function Tests: Recent Labs  Lab 11/25/21 1414 11/26/21 1817 11/27/21 1338  AST 21 17  --   ALT 15 15  --   ALKPHOS 46 43  --   BILITOT 0.6 0.5  --   PROT 6.2 5.2* 5.1*  ALBUMIN 3.6 2.7*  --    No results for input(s): "LIPASE", "AMYLASE" in the last 168 hours. No results for input(s): "AMMONIA" in the last 168 hours.  ABG No results found for: "PHART", "PCO2ART", "PO2ART", "HCO3", "TCO2", "ACIDBASEDEF", "O2SAT"   Coagulation Profile: Recent Labs  Lab 11/26/21 1817  INR 1.1    Cardiac Enzymes: Recent Labs  Lab 11/26/21 1817  CKTOTAL 14*    HbA1C: Hgb A1c MFr Bld  Date/Time Value Ref Range Status  12/23/2020 10:12 PM 5.4 4.8 - 5.6 % Final    Comment:    (NOTE) Pre diabetes:          5.7%-6.4%  Diabetes:              >6.4%  Glycemic control for   <7.0% adults with diabetes     CBG: No results for input(s): "GLUCAP" in the last 168 hours.   Critical care time: n/a    01/27/22, MD [facility] Office: [phone]   See Amion for personal pager PCCM on call  pager [phone] until 7pm. Please call Elink 7p-7a. [phone]

## 2021-11-28 NOTE — Progress Notes (Signed)
Dr. Francine Graven in rounding on patient, patient noted to be hypotensive, rapid called. Patient is up in recliner with LE's elevated alert and orientated x's 4. Bolus infusing, Dr Francine Graven at bedside and Rapid Response nurse.

## 2021-11-28 NOTE — Significant Event (Signed)
Rapid Response Event Note   Reason for Call :  Hypotension:  BP 41/31  Initial Focused Assessment:  Per Dr Francine Graven when he came in to see patient she was much less talkative than usual.  BP 41/31.    Heart tones irregular/AF Lung sounds decreased bases   Interventions:  500cc LR bolus Additional LR 100cc/hr x 5 hours  Patient back to baseline mental status as fluids infusing and BP improving.  Post bolus BP 112/71 HR 96  Plan of Care:  RN to call if patient becomes hypotensive again   Event Summary:   MD Notified: Dr Francine Graven at bedside Call Time: 1126 Arrival Time: 1129 End Time: 1210  Marcellina Millin, RN

## 2021-11-28 NOTE — Telephone Encounter (Signed)
Patient is in the hospital for pleural effusion. Patient did not want a call back, but wanted to make Dr. Eldridge Dace aware since they are changing some of her medications. Will forward to Dr. Eldridge Dace as Lorain Childes.

## 2021-11-29 ENCOUNTER — Other Ambulatory Visit (HOSPITAL_COMMUNITY): Payer: Self-pay

## 2021-11-29 ENCOUNTER — Inpatient Hospital Stay (HOSPITAL_COMMUNITY): Payer: Medicare Other

## 2021-11-29 DIAGNOSIS — I4821 Permanent atrial fibrillation: Secondary | ICD-10-CM | POA: Diagnosis not present

## 2021-11-29 DIAGNOSIS — I9589 Other hypotension: Secondary | ICD-10-CM

## 2021-11-29 DIAGNOSIS — M0579 Rheumatoid arthritis with rheumatoid factor of multiple sites without organ or systems involvement: Secondary | ICD-10-CM

## 2021-11-29 DIAGNOSIS — I480 Paroxysmal atrial fibrillation: Secondary | ICD-10-CM | POA: Diagnosis not present

## 2021-11-29 DIAGNOSIS — I3139 Other pericardial effusion (noninflammatory): Secondary | ICD-10-CM | POA: Diagnosis not present

## 2021-11-29 DIAGNOSIS — J90 Pleural effusion, not elsewhere classified: Secondary | ICD-10-CM | POA: Diagnosis not present

## 2021-11-29 LAB — BASIC METABOLIC PANEL
Anion gap: 5 (ref 5–15)
BUN: 15 mg/dL (ref 8–23)
CO2: 27 mmol/L (ref 22–32)
Calcium: 8.2 mg/dL — ABNORMAL LOW (ref 8.9–10.3)
Chloride: 106 mmol/L (ref 98–111)
Creatinine, Ser: 0.73 mg/dL (ref 0.44–1.00)
GFR, Estimated: >60 mL/min (ref >60)
Glucose, Bld: 178 mg/dL — ABNORMAL HIGH (ref 70–99)
Potassium: 3.5 mmol/L (ref 3.5–5.1)
Sodium: 138 mmol/L (ref 135–145)

## 2021-11-29 LAB — ECHOCARDIOGRAM LIMITED
S' Lateral: 2.4 cm
Weight: 2776.03 oz

## 2021-11-29 LAB — MISC LABCORP TEST (SEND OUT)
LabCorp test name: 830893
Labcorp test code: 8895

## 2021-11-29 LAB — CBC
HCT: 35.4 % — ABNORMAL LOW (ref 36.0–46.0)
Hemoglobin: 12.1 g/dL (ref 12.0–15.0)
MCH: 33.6 pg (ref 26.0–34.0)
MCHC: 34.2 g/dL (ref 30.0–36.0)
MCV: 98.3 fL (ref 80.0–100.0)
Platelets: 290 10*3/uL (ref 150–400)
RBC: 3.6 MIL/uL — ABNORMAL LOW (ref 3.87–5.11)
RDW: 14.6 % (ref 11.5–15.5)
WBC: 7.4 10*3/uL (ref 4.0–10.5)
nRBC: 0 % (ref 0.0–0.2)

## 2021-11-29 LAB — PATHOLOGIST SMEAR REVIEW

## 2021-11-29 LAB — ANTINUCLEAR ANTIBODIES, IFA: ANA Ab, IFA: NEGATIVE

## 2021-11-29 MED ORDER — FUROSEMIDE 20 MG PO TABS: 20.0000 mg | ORAL_TABLET | Freq: Every day | ORAL | 0 refills | Status: DC

## 2021-11-29 MED ORDER — PREDNISONE 10 MG PO TABS
20.0000 mg | ORAL_TABLET | Freq: Every day | ORAL | 3 refills | Status: DC
  Filled 2021-11-29: qty 60, 30d supply, fill #0

## 2021-11-29 MED ORDER — METOPROLOL TARTRATE 25 MG PO TABS: 25.0000 mg | ORAL_TABLET | Freq: Two times a day (BID) | ORAL | Status: DC

## 2021-11-29 MED ORDER — METOPROLOL TARTRATE 25 MG PO TABS: 25.0000 mg | ORAL_TABLET | Freq: Two times a day (BID) | ORAL | 1 refills | Status: DC

## 2021-11-29 MED ORDER — ATOVAQUONE 750 MG/5ML PO SUSP
750.0000 mg | Freq: Two times a day (BID) | ORAL | 2 refills | Status: DC
  Filled 2021-11-29: qty 210, 21d supply, fill #0

## 2021-11-29 NOTE — Progress Notes (Signed)
PROGRESS NOTE        PATIENT DETAILS Name: Rebecca Short Age: 81 y.o. Sex: female Date of Birth: 10-26-1940 Admit Date: 11/26/2021 Admitting Physician Martina Sinner, MD HEN:IDPO, Darlen Round, MD  Brief Summary: Patient is a 81 y.o.  female with history of RA on prednisone/azathioprine, atrial fibrillation on Eliquis-presented with shortness of breath-found to have a enlarging left pleural effusion.   Significant events: 7/4>> admit for symptomatic left pleural effusion by PCCM 7/5>> transfer to [facility] 7/5>> chest tube placed 7/7>> chest tube removed  Significant studies: 7/3>> CXR: Increased left pleural effusion, small right pleural effusion. 7/6>> CT chest: Small bilateral pleural effusions-right> left-atelectasis, small pericardial effusion, distended pulmonary trunk.   Significant microbiology data: 7/4>> blood culture: No growth 7/5>> pleural fluid culture: No growth  Procedures: 7/5>> chest tube placement by PCCM  Consults: PCCM  Subjective: Chest tube removed earlier this morning.  No complaints-wants cardiology involved in her care.  Objective: Vitals: Blood pressure 110/70, pulse 79, temperature 97.7 F (36.5 C), temperature source Oral, resp. rate (!) 23, weight 78.7 kg, SpO2 94 %.   Exam: Gen Exam:Alert awake-not in any distress HEENT:atraumatic, normocephalic Chest: B/L clear to auscultation anteriorly CVS:S1S2 regular Abdomen:soft non tender, non distended Extremities:no edema Neurology: Non focal Skin: no rash   Pertinent Labs/Radiology:    Latest Ref Rng & Units 11/29/2021    1:03 AM 11/28/2021    5:33 AM 11/26/2021    6:17 PM  CBC  WBC 4.0 - 10.5 K/uL 7.4  5.8  7.5   Hemoglobin 12.0 - 15.0 g/dL 24.2  35.3  61.4   Hematocrit 36.0 - 46.0 % 35.4  37.5  36.0   Platelets 150 - 400 K/uL 290  261  260     Lab Results  Component Value Date   NA 138 11/29/2021   K 3.5 11/29/2021   CL 106 11/29/2021   CO2 27 11/29/2021       Assessment/Plan: Left pleural effusion: Exudative in etiology-likely secondary to rheumatoid arthritis.  Chest tube removed on 7/7-prednisone increased to 20 mg daily-continue azathioprine-no further recommendations-PCCM will arrange for outpatient follow-up.  Pleural studies are still pending-they will need to be followed by PCCM/primary care practitioner.  Transient Hypotension: Occurred on 7/6-following dose of Lasix-resolved with IVF boluses.  Pending limited echo.  Cardiology consulted at patient's request.   Rheumatoid arthritis: Continue azathioprine/prednisone.  As noted above-prednisone dose increased to 20 mg-we will defer taper to rheumatology.  PCCM recommending atovaquone for PJP prophylaxis.     Persistent atrial fibrillation: Rate controlled-continue metoprolol-Eliquis    Pericardial effusion: Seen incidentally on CT chest-stable on echo-she has a history of pericardiocentesis.  Pending limited echo.  Probably related to underlying RA.   Hyponatremia: Resolved.   GERD: Continue PPI    Obesity: Estimated body mass index is 31.73 kg/m as calculated from the following:   Height as of 10/22/21: 5\' 2"  (1.575 m).   Weight as of this encounter: 78.7 kg.   Code status:   Code Status: Full Code   DVT Prophylaxis: SCDs Start: 11/26/21 1649 apixaban (ELIQUIS) tablet 5 mg    Family Communication: None at bedside   Disposition Plan: Status is: Inpatient Remains inpatient appropriate because: Chest tube removed this morning-awaiting cardiology eval/limited echocardiogram given history of pericardial effusion.   Planned Discharge Destination:Home   Diet: Diet Order  Diet - low sodium heart healthy           Diet regular Room service appropriate? Yes; Fluid consistency: Thin  Diet effective now                     Antimicrobial agents: Anti-infectives (From admission, onward)    Start     Dose/Rate Route Frequency Ordered Stop   11/29/21  0000  atovaquone (MEPRON) 750 MG/5ML suspension        750 mg Oral 2 times daily with meals 11/29/21 1155 02/27/22 2359        MEDICATIONS: Scheduled Meds:  apixaban  5 mg Oral BID   azaTHIOprine  100 mg Oral Daily   metoprolol tartrate  12.5 mg Oral BID   pantoprazole  40 mg Oral Q1200   predniSONE  20 mg Oral Q breakfast   sodium chloride flush  10 mL Other Q8H   Continuous Infusions: PRN Meds:.acetaminophen, docusate sodium, ondansetron (ZOFRAN) IV, polyethylene glycol   I have personally reviewed following labs and imaging studies  LABORATORY DATA: CBC: Recent Labs  Lab 11/25/21 1414 11/26/21 1817 11/28/21 0533 11/29/21 0103  WBC 9.0 7.5 5.8 7.4  NEUTROABS 7.9* 6.0  --   --   HGB 13.7 12.7 12.6 12.1  HCT 40.6 36.0 37.5 35.4*  MCV 100.3* 97.3 100.0 98.3  PLT 260.0 260 261 290     Basic Metabolic Panel: Recent Labs  Lab 11/25/21 1414 11/26/21 1817 11/28/21 0533 11/29/21 0103  NA 131* 134* 138 138  K 4.3 4.1 3.7 3.5  CL 95* 99 102 106  CO2 30 23 30 27   GLUCOSE 138* 110* 89 178*  BUN 12 12 12 15   CREATININE 0.69 0.57 0.79 0.73  CALCIUM 9.1 8.5* 8.6* 8.2*  MG  --  2.2  --   --   PHOS  --  3.2  --   --      GFR: Estimated Creatinine Clearance: 53.5 mL/min (by C-G formula based on SCr of 0.73 mg/dL).  Liver Function Tests: Recent Labs  Lab 11/25/21 1414 11/26/21 1817 11/27/21 1338  AST 21 17  --   ALT 15 15  --   ALKPHOS 46 43  --   BILITOT 0.6 0.5  --   PROT 6.2 5.2* 5.1*  ALBUMIN 3.6 2.7*  --     No results for input(s): "LIPASE", "AMYLASE" in the last 168 hours. No results for input(s): "AMMONIA" in the last 168 hours.  Coagulation Profile: Recent Labs  Lab 11/26/21 1817  INR 1.1     Cardiac Enzymes: Recent Labs  Lab 11/26/21 1817  CKTOTAL 14*     BNP (last 3 results) No results for input(s): "PROBNP" in the last 8760 hours.  Lipid Profile: No results for input(s): "CHOL", "HDL", "LDLCALC", "TRIG", "CHOLHDL",  "LDLDIRECT" in the last 72 hours.  Thyroid Function Tests: No results for input(s): "TSH", "T4TOTAL", "FREET4", "T3FREE", "THYROIDAB" in the last 72 hours.  Anemia Panel: No results for input(s): "VITAMINB12", "FOLATE", "FERRITIN", "TIBC", "IRON", "RETICCTPCT" in the last 72 hours.  Urine analysis:    Component Value Date/Time   COLORURINE YELLOW 01/03/2021 1810   APPEARANCEUR HAZY (A) 01/03/2021 1810   LABSPEC 1.013 01/03/2021 1810   PHURINE 6.0 01/03/2021 1810   GLUCOSEU NEGATIVE 01/03/2021 1810   HGBUR NEGATIVE 01/03/2021 1810   BILIRUBINUR NEGATIVE 01/03/2021 1810   KETONESUR 20 (A) 01/03/2021 1810   PROTEINUR NEGATIVE 01/03/2021 1810   UROBILINOGEN 0.2 07/15/2013 1004  NITRITE NEGATIVE 01/03/2021 1810   LEUKOCYTESUR TRACE (A) 01/03/2021 1810    Sepsis Labs: Lactic Acid, Venous    Component Value Date/Time   LATICACIDVEN 1.6 01/19/2021 1803    MICROBIOLOGY: Recent Results (from the past 240 hour(s))  Culture, blood (Routine X 2) w Reflex to ID Panel     Status: None (Preliminary result)   Collection Time: 11/26/21  6:43 PM   Specimen: BLOOD  Result Value Ref Range Status   Specimen Description BLOOD SITE NOT SPECIFIED  Final   Special Requests   Final    BOTTLES DRAWN AEROBIC AND ANAEROBIC Blood Culture adequate volume   Culture   Final    NO GROWTH 3 DAYS Performed at [facility], [place] [address] [place], [place] [address]    Report Status PENDING  Incomplete  Culture, blood (Routine X 2) w Reflex to ID Panel     Status: None (Preliminary result)   Collection Time: 11/26/21  6:43 PM   Specimen: BLOOD  Result Value Ref Range Status   Specimen Description BLOOD SITE NOT SPECIFIED  Final   Special Requests   Final    BOTTLES DRAWN AEROBIC AND ANAEROBIC Blood Culture results may not be optimal due to an excessive volume of blood received in culture bottles   Culture   Final    NO GROWTH 3 DAYS Performed at [facility], [place] [address],  [place], [place] [address]    Report Status PENDING  Incomplete  Body fluid culture w Gram Stain     Status: None (Preliminary result)   Collection Time: 11/27/21  1:37 PM   Specimen: Pleural Fluid  Result Value Ref Range Status   Specimen Description PLEURAL  Final   Special Requests NONE  Final   Gram Stain   Final    WBC PRESENT,BOTH PMN AND MONONUCLEAR NO ORGANISMS SEEN CYTOSPIN SMEAR    Culture   Final    NO GROWTH 2 DAYS Performed at [facility], [place] [address], [place], [place] [address]    Report Status PENDING  Incomplete    RADIOLOGY STUDIES/RESULTS: CT CHEST W CONTRAST  Result Date: 11/28/2021 CLINICAL DATA:  Chronic left pleural effusion, 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 History of rheumatoid arthritis. EXAM: CT CHEST WITH CONTRAST TECHNIQUE: Multidetector CT imaging of the chest was performed during intravenous contrast administration. RADIATION DOSE REDUCTION: This exam was performed according to the departmental dose-optimization program which includes automated exposure control, adjustment of the mA and/or kV according to patient size and/or use of iterative reconstruction technique. CONTRAST:  53mL OMNIPAQUE IOHEXOL 300 MG/ML  SOLN COMPARISON:  04/29/2021. FINDINGS: Cardiovascular: The heart is enlarged and there is a small pericardial effusion. There is atherosclerotic calcification of the aorta without evidence of aneurysm. The pulmonary trunk is distended which may be associated with underlying pulmonary artery hypertension. Mediastinum/Nodes: No mediastinal, hilar, or axillary lymphadenopathy. A calcified lymph node is noted at in the hilar region on the left. The thyroid gland, trachea, and esophagus are within normal limits. Lungs/Pleura: Small bilateral pleural effusions are noted, greater on the left than on the right. Strandy airspace disease is noted bilaterally, greater on the left than on the right. No pneumothorax. A left-sided chest drain is in place. Upper Abdomen: Fatty  infiltration of the liver is noted. The gallbladder is surgically absent. Scattered hypodensities are present in the spleen, possible cysts. A cyst is present in the upper pole of the left kidney. Musculoskeletal: Degenerative changes are present in the  thoracic spine. No acute osseous abnormality. IMPRESSION: 1. Small bilateral pleural effusions, greater on the right than on the left, with a left chest drain in place and not significantly changed from the prior exam. 2. Stable airspace disease is noted bilaterally, possible atelectasis. 3. Cardiomegaly with small pericardial effusion and coronary artery calcifications. 4. Distended pulmonary trunk suggesting underlying pulmonary artery hypertension. 5. Aortic atherosclerosis. Electronically Signed   By: Brett Fairy M.D.   On: 11/28/2021 03:50     LOS: 3 days   Oren Binet, MD  [facility]    To contact the attending provider between 7A-7P or the covering provider during after hours 7P-7A, please log into the web site [email or web address] and access using universal [place] password for that web site. If you do not have the password, please call the hospital operator.  11/29/2021, 2:59 PM

## 2021-11-29 NOTE — Progress Notes (Signed)
Pt home CPAP. Pt stated that RN placed her on home CPAP tonight.

## 2021-11-29 NOTE — Progress Notes (Signed)
NAME:  Rebecca Short, MRN:  [id number], DOB:  05/28/1940, LOS: 3 ADMISSION DATE:  11/26/2021, CONSULTATION DATE:  11/26/21 REFERRING MD:  Francine Graven CHIEF COMPLAINT:  Pleural Effusion    History of Present Illness:  81 y/o F with atrial fibrillation on eliquis, rheumatoid arthritis, and chronic left pleural effusion related to RA on prolonged prednisone taper and azathioprine. She was tapered down to 10mg  of prednisone daily on 6/12. She reports developing progressive fatigue and shortness of breath over the past week. She called the pulmonary office where a chest radiograph showed increase in left pleural effusion and small right pleural effusion. Given her return of symptoms and increase in effusion she has been directly admitted for chest tube placement and further evaluation.   She last took eliquis yesterday (7/3) morning around 8am. She denies any fevers, chills or sweats. She denies chest pain, nausea or vomiting.   Pertinent  Medical History  Rheumatoid arthritis Chronic Left pleural effusion Atrial Fibrillation Pericardial Effusion s/p drainage 2022 OSA - mild  Significant Hospital Events: Including procedures, antibiotic start and stop dates in addition to other pertinent events   7/4 Admit with progressive fatigue, SOB. Increased pleural effusion.  Bedside 2023 > moderate to large left pleural effusion, simple appearing 7/5 Chest tube placed 7/6 CT Chest w/contrast >> small bilateral pleural effusions, R>L, left chest drain in place, stable airspace disease bilaterally, cardiomegaly with small pericardial effusion, distended pulmonary trunk suggestive of underlying pulmonary artery hypertension 7/6 CT put out ~830ml in total, attempted diuresis but patient became hypotension / responded to IVF, lopressor dose had been increased / may have contributed to hypotension  Interim History / Subjective:  Chest tube - no further drainage, ~ 80m in chamber  Pleural cultures / studies pending  Pt  reports she slept well overnight, had to use ear plugs   Objective   Blood pressure (!) 116/92, pulse 71, temperature 98.2 F (36.8 C), temperature source Oral, resp. rate 19, weight 78.7 kg, SpO2 98 %.        Intake/Output Summary (Last 24 hours) at 11/29/2021 0746 Last data filed at 11/29/2021 01/30/2022 Gross per 24 hour  Intake 1360 ml  Output 190 ml  Net 1170 ml   Filed Weights   11/29/21 0425  Weight: 78.7 kg    Examination: General: elderly female lying in bed in NAD   HEENT: MM pink/moist, anicteric, pupils equal/reactive  Neuro: AAOx4, speech clear, MAE  CV: s1s2 irr irr, no m/r/g PULM: non-labored at rest, lungs bilaterally with good air entry, no wheezing  GI: soft, bsx4 active  Extremities: warm/dry, no edema  Skin: no rashes or lesions   Resolved Hospital Problem list     Assessment & Plan:   Exudative Left Pleural Effusion  Progressive Fatigue Mild Hyponatremia Rheumatoid Arthritis Atrial Fibrillation  Plan:  -discontinue left chest tube  -continue prednisone 20 mg QD until seen in clinic  -will need suppressive therapy given prednisone dosing > previously on atovaquone suspension (previously on 750mg /36ml BID) -continue azathioprine QD > await further rec's from Dr.  -follow up inflammatory labs > RF elevated at 35.3.  -pulmonary hygiene -IS, mobilize as able  -follow up plural cultures, ADA, Quant Gold -continue eliquis, metoprolol -follow up with Dr. 4m on 7/14 at 9:30 am  Best Practice (right click and "Reselect all SmartList Selections" daily)  Diet/type: Regular consistency (see orders) DVT prophylaxis: SCD GI prophylaxis: N/A Lines: N/A Foley:  N/A Code Status:  full code Last date of  multidisciplinary goals of care discussion: n/a     Canary Brim, MSN, APRN, NP-C, AGACNP-BC [facility] 11/29/2021, 9:11 AM   Please see [email or web address] for pager details.   From 7A-7P if no response, please call [phone] After  hours, please call ELink [phone]

## 2021-11-29 NOTE — Consult Note (Addendum)
Cardiology Consultation:   Patient ID: XAYAH DUNSTAN MRN: [id number]; DOB: 1940-07-06  Admit date: 11/26/2021 Date of Consult: 11/29/2021  PCP:  Daisy Floro, MD   [facility] Providers Cardiologist:  [name], MD  Sleep Medicine:  Armanda Magic, MD  {    Patient Profile:   JASELYN TONINI is a 81 y.o. female with a hx of permanent atrial fibrillation, obstructive sleep apnea on CPAP, chronic left pleural effusion, prior pericardial effusion pericarditis s/p thoracentesis and rheumatoid arthritis who is being seen 11/29/2021 for the evaluation of hypotension at the request of Dr. Jerral Ralph.  Admitted in July/August 2022 for pneumonia with pericardial and pleural effusion.  Underwent thoracentesis x2 and pericardiocentesis.  Had a recurrent pleural effusion requiring admission and repeat thoracentesis 01/2021.  She was treated with colchicine for 3 months for pericarditis.  Patient was doing well on cardiac standpoint when last seen by Dr. Eldridge Dace January 2023.  History of Present Illness:   Ms. Mory presented 7/4 for worsening shortness of breath and found to have enlarging left pleural effusion and small right pleural effusion.  She underwent chest tube placement 7/5 with removal of approximately 800 mL.  IV Lasix yesterday morning leading to drop in blood pressure to 41/31.  She was also on metoprolol tartrate 25 mg twice daily>> now reduced to 12.5 mg twice daily.  She got total of 1 L of normal saline bolus with improved blood pressure.  Her chest tube was removed this morning.  Cardiology is asked for evaluation of episode of hypotension following IV Lasix per patient request.  Has pending reading of limited echocardiogram.  Plan to send home later today.  Potassium 3.5 Creatinine normal Hemoglobin 12.1   Past Medical History:  Diagnosis Date   A-fib [facility])    Allergy    Atrial fibrillation (HCC)    FH: cholecystectomy 1997   Macular degeneration    OSA  (obstructive sleep apnea)    mild with total AHI 7.25/hr and severe during REM sleep at 35/hr now on CPAP at 6cm H2O    Past Surgical History:  Procedure Laterality Date   CHEST TUBE INSERTION Left 11/27/2021   Procedure: CHEST TUBE INSERTION;  Surgeon: Martina Sinner, MD;  Location: [facility] ENDOSCOPY;  Service: Pulmonary;  Laterality: Left;  Prefer AM slot, thanks   CHOLECYSTECTOMY     PERICARDIOCENTESIS N/A 01/07/2021   Procedure: PERICARDIOCENTESIS;  Surgeon: Yvonne Kendall, MD;  Location: [facility] INVASIVE CV LAB;  Service: Cardiovascular;  Laterality: N/A;   REPLACEMENT TOTAL KNEE BILATERAL     THORACENTESIS N/A 01/25/2021   Procedure: THORACENTESIS;  Surgeon: Martina Sinner, MD;  Location: [facility] ENDOSCOPY;  Service: Pulmonary;  Laterality: N/A;   THORACENTESIS N/A 04/08/2021   Procedure: THORACENTESIS;  Surgeon: Martina Sinner, MD;  Location: [facility] ENDOSCOPY;  Service: Pulmonary;  Laterality: N/A;   THORACENTESIS N/A 04/24/2021   Procedure: THORACENTESIS;  Surgeon: Lupita Leash, MD;  Location: [facility] ENDOSCOPY;  Service: Cardiopulmonary;  Laterality: N/A;    Inpatient Medications: Scheduled Meds:  apixaban  5 mg Oral BID   azaTHIOprine  100 mg Oral Daily   metoprolol tartrate  25 mg Oral BID   pantoprazole  40 mg Oral Q1200   predniSONE  20 mg Oral Q breakfast   sodium chloride flush  10 mL Other Q8H   Continuous Infusions:  PRN Meds: acetaminophen, docusate sodium, ondansetron (ZOFRAN) IV, polyethylene glycol  Allergies:    Allergies  Allergen Reactions   Erythromycin Other (See Comments)  Other reaction(s): burning   Sulfa Antibiotics     Other reaction(s): Unknown   Azithromycin Other (See Comments)    Pain in arm with IV infusion    Condrolite [Glucosamine-Chondroitin-Msm] Rash   Glucosamine Rash    Other reaction(s): rash Other reaction(s): rash   Sulfamethoxazole-Trimethoprim Rash    Other reaction(s): rash Other reaction(s): rash    Social History:    Social History   Socioeconomic History   Marital status: Married    Spouse name: Not on file   Number of children: Not on file   Years of education: Not on file   Highest education level: Not on file  Occupational History   Not on file  Tobacco Use   Smoking status: Never   Smokeless tobacco: Never  Vaping Use   Vaping Use: Never used  Substance and Sexual Activity   Alcohol use: Never   Drug use: Never   Sexual activity: Not on file  Other Topics Concern   Not on file  Social History Narrative   ** Merged History Encounter **       Social Determinants of Health   Financial Resource Strain: Not on file  Food Insecurity: Not on file  Transportation Needs: Not on file  Physical Activity: Not on file  Stress: Not on file  Social Connections: Not on file  Intimate Partner Violence: Not on file    Family History:    Family History  Problem Relation Age of Onset   Osteoporosis Mother    Macular degeneration Mother    Heart Problems Father    Stroke Father    Diabetes Brother    Kidney failure Brother    Cancer Maternal Grandmother    Dementia Paternal Grandmother    Suicidality Other    Heart attack Neg Hx      ROS:  Please see the history of present illness.   All other ROS reviewed and negative.     Physical Exam/Data:   Vitals:   11/29/21 0813 11/29/21 1131 11/29/21 1558 11/29/21 1600  BP: 122/81 110/70 127/86 127/86  Pulse: 91 79 97 97  Resp: 16 (!) 23 20 20   Temp:  97.7 F (36.5 C) 97.7 F (36.5 C) 97.7 F (36.5 C)  TempSrc:  Oral Oral Oral  SpO2: 94% 94% 96% 98%  Weight:        Intake/Output Summary (Last 24 hours) at 11/29/2021 1617 Last data filed at 11/29/2021 0644 Gross per 24 hour  Intake 1000 ml  Output 190 ml  Net 810 ml      11/29/2021    4:25 AM 10/22/2021   10:00 AM 09/27/2021    3:50 PM  Last 3 Weights  Weight (lbs) 173 lb 8 oz 166 lb 167 lb 9.6 oz  Weight (kg) 78.7 kg 75.297 kg 76.023 kg     Body mass index is 31.73 kg/m.   General:  Well nourished, well developed, in no acute distress HEENT: normal Neck: no JVD Vascular: No carotid bruits; Distal pulses 2+ bilaterally Cardiac:  normal S1, S2; RRR; no murmur  Lungs:  clear to auscultation bilaterally, no wheezing, rhonchi or rales  Abd: soft, nontender, no hepatomegaly  Ext: no edema Musculoskeletal:  No deformities, BUE and BLE strength normal and equal Skin: warm and dry  Neuro:  CNs 2-12 intact, no focal abnormalities noted Psych:  Normal affect   EKG:  The EKG was personally reviewed and demonstrates:  afib 113 bpm Telemetry:  Telemetry was personally reviewed and  demonstrates:  Atrial fibrillation at 110s  Relevant CV Studies:  Limited echo 01/20/21 1. Left ventricular ejection fraction, by estimation, is 60 to 65%. The  left ventricle has normal function. The left ventricle has no regional  wall motion abnormalities.   2. A small pericardial effusion is present. There is no evidence of  cardiac tamponade.   3. The inferior vena cava is normal in size with greater than 50%  respiratory variability, suggesting right atrial pressure of 3 mmHg.   Comparison(s): No significant change from prior study.   MR cardiac 08/2013 IMPRESSION:  1) No RA mass normal size   2) Moderate LAE   3) No LAA thrombus   4) Normal EF 59% no RWMAls   5) Normal RV   6) No hyperenhancement or scar tissue   7) Normal AV, MV and TV   8) Trivial pericardial effusion   Laboratory Data:  High Sensitivity Troponin:   Recent Labs  Lab 11/26/21 1817  TROPONINIHS 6     Chemistry Recent Labs  Lab 11/26/21 1817 11/28/21 0533 11/29/21 0103  NA 134* 138 138  K 4.1 3.7 3.5  CL 99 102 106  CO2 23 30 27   GLUCOSE 110* 89 178*  BUN 12 12 15   CREATININE 0.57 0.79 0.73  CALCIUM 8.5* 8.6* 8.2*  MG 2.2  --   --   GFRNONAA >60 >60 >60  ANIONGAP 12 6 5     Recent Labs  Lab 11/25/21 1414 11/26/21 1817 11/27/21 1338  PROT 6.2 5.2* 5.1*  ALBUMIN 3.6 2.7*  --    AST 21 17  --   ALT 15 15  --   ALKPHOS 46 43  --   BILITOT 0.6 0.5  --     Hematology Recent Labs  Lab 11/26/21 1817 11/28/21 0533 11/29/21 0103  WBC 7.5 5.8 7.4  RBC 3.70* 3.75* 3.60*  HGB 12.7 12.6 12.1  HCT 36.0 37.5 35.4*  MCV 97.3 100.0 98.3  MCH 34.3* 33.6 33.6  MCHC 35.3 33.6 34.2  RDW 14.7 14.7 14.6  PLT 260 261 290    BNP Recent Labs  Lab 11/26/21 1817  BNP 235.7*     Radiology/Studies:  ECHOCARDIOGRAM LIMITED  Result Date: 11/29/2021    ECHOCARDIOGRAM LIMITED REPORT   Patient Name:   SYLEENA REPOSA Date of Exam: 11/29/2021 Medical Rec #:  [id number]     Height:       62.0 in Accession #:    [id number]    Weight:       173.5 lb Date of Birth:  Aug 22, 1940     BSA:          1.800 m Patient Age:    80 years      BP:           110/70 mmHg Patient Gender: F             HR:           116 bpm. Exam Location:  Inpatient Procedure: Limited Echo and Limited Color Doppler Indications:    Peri effusion  History:        Patient has prior history of Echocardiogram examinations, most                 recent 01/20/2021. Arrythmias:Atrial Fibrillation.  Sonographer:    Rodrigo Ran RCS Referring Phys: [phone] Werner Lean [facility] IMPRESSIONS  1. Left ventricular ejection fraction, by estimation, is 55 to 60%. The left ventricle has normal function. Left  ventricular diastolic parameters are indeterminate.  2. Right ventricular systolic function is mildly reduced. The right ventricular size is normal.  3. A small pericardial effusion is present.  4. The mitral valve is normal in structure. Trivial mitral valve regurgitation.  5. The aortic valve was not well visualized. Aortic valve regurgitation is not visualized.  6. The inferior vena cava is normal in size with greater than 50% respiratory variability, suggesting right atrial pressure of 3 mmHg. FINDINGS  Left Ventricle: Left ventricular ejection fraction, by estimation, is 55 to 60%. The left ventricle has normal function. The left ventricular  internal cavity size was normal in size. There is no left ventricular hypertrophy. Left ventricular diastolic parameters are indeterminate. Right Ventricle: The right ventricular size is normal. Right ventricular systolic function is mildly reduced. Right Atrium: Right atrial size was normal in size. Pericardium: A small pericardial effusion is present. Mitral Valve: The mitral valve is normal in structure. Trivial mitral valve regurgitation. Tricuspid Valve: The tricuspid valve is normal in structure. Tricuspid valve regurgitation is not demonstrated. Aortic Valve: The aortic valve was not well visualized. Aortic valve regurgitation is not visualized. Aorta: The aortic root and ascending aorta are structurally normal, with no evidence of dilitation. Venous: The inferior vena cava is normal in size with greater than 50% respiratory variability, suggesting right atrial pressure of 3 mmHg. IAS/Shunts: The interatrial septum was not well visualized. LEFT VENTRICLE PLAX 2D LVIDd:         3.90 cm Diastology LVIDs:         2.40 cm LV e' medial:  8.92 cm/s LV PW:         1.00 cm LV e' lateral: 7.51 cm/s LV IVS:        0.80 cm  RIGHT VENTRICLE            IVC RV Basal diam:  3.10 cm    IVC diam: 1.70 cm RV Mid diam:    2.65 cm RV S prime:     8.27 cm/s LEFT ATRIUM           Index        RIGHT ATRIUM           Index LA Vol (A4C): 56.8 ml 31.56 ml/m  RA Area:     19.70 cm                                    RA Volume:   43.20 ml  24.00 ml/m   AORTA Ao Asc diam: 3.50 cm Epifanio Lesches MD Electronically signed by Epifanio Lesches MD Signature Date/Time: 11/29/2021/4:15:54 PM    Final    CT CHEST W CONTRAST  Result Date: 11/28/2021 CLINICAL DATA:  Chronic left pleural effusion, 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 History of rheumatoid arthritis. EXAM: CT CHEST WITH CONTRAST TECHNIQUE: Multidetector CT imaging of the chest was performed during intravenous contrast administration. RADIATION DOSE REDUCTION: This exam was performed  according to the departmental dose-optimization program which includes automated exposure control, adjustment of the mA and/or kV according to patient size and/or use of iterative reconstruction technique. CONTRAST:  75mL OMNIPAQUE IOHEXOL 300 MG/ML  SOLN COMPARISON:  04/29/2021. FINDINGS: Cardiovascular: The heart is enlarged and there is a small pericardial effusion. There is atherosclerotic calcification of the aorta without evidence of aneurysm. The pulmonary trunk is distended which may be associated with underlying pulmonary artery hypertension. Mediastinum/Nodes: No mediastinal, hilar, or axillary lymphadenopathy. A  calcified lymph node is noted at in the hilar region on the left. The thyroid gland, trachea, and esophagus are within normal limits. Lungs/Pleura: Small bilateral pleural effusions are noted, greater on the left than on the right. Strandy airspace disease is noted bilaterally, greater on the left than on the right. No pneumothorax. A left-sided chest drain is in place. Upper Abdomen: Fatty infiltration of the liver is noted. The gallbladder is surgically absent. Scattered hypodensities are present in the spleen, possible cysts. A cyst is present in the upper pole of the left kidney. Musculoskeletal: Degenerative changes are present in the thoracic spine. No acute osseous abnormality. IMPRESSION: 1. Small bilateral pleural effusions, greater on the right than on the left, with a left chest drain in place and not significantly changed from the prior exam. 2. Stable airspace disease is noted bilaterally, possible atelectasis. 3. Cardiomegaly with small pericardial effusion and coronary artery calcifications. 4. Distended pulmonary trunk suggesting underlying pulmonary artery hypertension. 5. Aortic atherosclerosis. Electronically Signed   By: Thornell Sartorius M.D.   On: 11/28/2021 03:50     Assessment and Plan:   Transient hypotension -Transient hypotension following IV Lasix yesterday.  Blood  pressure improved after 1 L of normal saline bolus.  MPending limited echocardiogram.  2. Hx of pericardiac effusion s/p thoracentesis 12/2020 - Follow up last limited echo with small effusion - pending echo today  3. Permanent atrial fibrillation  - HR elevated his afternoon at 110s - Consider increasing metoprolol to 25mg  BID (can reduce later) - Continue Eliquis for anticoagulation  4. OSA on CPAP  5. Chronic L pleural effusion  -s/p chest tube placement and removal - On lasix 20mg  qd at home  For questions or updates, please contact [facility] Please consult [email or web address] for contact info under   Seen by PA Bhagat.  Personally seen and examined. Agree with APP above with the following comments:  Briefly 81 yo F with a history of permanent atrial fibrillation, OSA on CPAP, Chronic recurring L pleural effusion s/p prior thoracentesis, L chest tube and with concern for rheumatoid arthritis.  She has had a long history of working with Dr. : husband has sheets of logs for her blood pressures and heart rates showing BP 110/70s (average) and heart rates 80s on lasix 20 mg PO daily and metoprolol tartrate 25 mg Po daily.   She was doing well post chest tube but have persistent pleural effusions.  Has strong response to IV diuresis (overflowing purewick and with bed pan outs).  She has has hypotension requiring return of IVF  Exam notable for IRIR tachycardia; bilateral crackles otherwise as above Echo done at bedside: EF is preserved, small pericardial effusion without evidence of increase pericardial pressure; in combination there is no evidence of cardiac tamponade.  Tele: AF rates 120s this PM  Would recommend  - may need to return to prior metoprolol does 25 mg PO BID - echo shows no evidence of tamponade - we had discussed temporary lasix hold and that if she has worsening SOB restart lasix and K; she will take them on Monday and Wednesday, and will plan to restart daily  diuretic on Friday.  If changes in her immunosupression, her diuretic regimen may be changed as well. - reasonable for DC today - reviewed with TRH MD, Patient, husband, and her friend who is a Wednesday.  Sunday, MD FASE Cardiologist [facility]  [address] [place], [address] [place], [place] [phone]  4:31  PM

## 2021-11-29 NOTE — Progress Notes (Signed)
CT chest reviewed with patient and husband. Additionally, showed to neighbor Caesar Chestnut, Charity fundraiser) at patient's request.  No further drainage from chest tube.  Site flushed as no pleural variant.  Approx 10-2ml drained after flushed.  Reviewed with Dr. Tonia Brooms, proceed with removal. Left chest tube removed at bedside. Patient tolerated well.  Occlusive dressing applied to site.  RN notified of removal. [place] disposed in biohazard bin.      Canary Brim, MSN, APRN, NP-C, AGACNP-BC [facility] [facility] 11/29/2021, 10:19 AM   Please see [email or web address] for pager details.   From 7A-7P if no response, please call [phone] After hours, please call ELink [phone]

## 2021-11-29 NOTE — Progress Notes (Signed)
Echocardiogram 2D Echocardiogram has been performed.  Rebecca Short 11/29/2021, 3:42 PM

## 2021-11-29 NOTE — Progress Notes (Addendum)
Chest tube removed-suspicion that pleural effusion is inflammatory from RA.  Prednisone dosage increased to 20 mg-will be placed on atovaquone for PJP prophylaxis.  Developed transient hypotension following IV Lasix yesterday-that responded to IVF.  Blood pressure stable today.  Patient requests that cardiology see her and cleared her for discharge-she normally follows with Dr. Eldridge Dace.  Limited echocardiogram  is pending.  I have asked for a cardiology consult at the patient's request.

## 2021-11-29 NOTE — Discharge Summary (Addendum)
PATIENT DETAILS Name: Rebecca Short Age: 81 y.o. Sex: female Date of Birth: 11/20/1940 MRN: [id number]. Admitting Physician: Martina Sinner, MD WUJ:[id number], Darlen Round, MD  Admit Date: 11/26/2021 Discharge date: 11/29/2021  Recommendations for Outpatient Follow-up:  Follow up with PCP in 1-2 weeks Please obtain CMP/CBC in one week Please ensure follow-up with rheumatology, pulmonology and cardiology. Pleural fluid studies still pending-will need to be followed by PCP/pulmonology. Follow formal Echo reading  Admitted From:  Home  Disposition: Home   Discharge Condition: fair  CODE STATUS:   Code Status: Full Code   Diet recommendation:  Diet Order             Diet - low sodium heart healthy           Diet regular Room service appropriate? Yes; Fluid consistency: Thin  Diet effective now                    Brief Summary: Patient is a 81 y.o.  female with history of RA on prednisone/azathioprine, atrial fibrillation on Eliquis-presented with shortness of breath-found to have a enlarging left pleural effusion.     Significant events: 7/4>> admit for symptomatic left pleural effusion by PCCM 7/5>> transfer to [facility] 7/5>> chest tube placed 7/7>> chest tube removed   Significant studies: 7/3>> CXR: Increased left pleural effusion, small right pleural effusion. 7/6>> CT chest: Small bilateral pleural effusions-right> left-atelectasis, small pericardial effusion, distended pulmonary trunk.     Significant microbiology data: 7/4>> blood culture: No growth 7/5>> pleural fluid culture: No growth   Procedures: 7/5>> chest tube placement by PCCM   Consults: PCCM  Brief Hospital Course: Left pleural effusion: Exudative in etiology-likely secondary to rheumatoid arthritis.  Chest tube removed on 7/7-prednisone increased to 20 mg daily-continue azathioprine-no further recommendations-PCCM will arrange for outpatient follow-up.  Pleural studies are still  pending-they will need to be followed by PCCM/primary care practitioner.   Rheumatoid arthritis: Continue azathioprine/prednisone.  As noted above-prednisone dose increased to 20 mg-we will defer taper to rheumatology.  At the recommendation of PCCM-has been placed on atovaquone on for PJP prophylaxis.   Persistent atrial fibrillation: Rate controlled-continue metoprolol-Eliquis    Pericardial effusion: Seen incidentally on CT chest-d/w Dr Lanelle Bal gave a prelim read on echo-no major effusion seen, PCP to follow on formal echo reading.    Hyponatremia: Resolved.   GERD: Continue PPI   Obesity: Estimated body mass index is 30.36 kg/m as calculated from the following:   Height as of 10/22/21:  (1.575 m).   Weight as of 10/22/21: 75.3 kg.   Discharge Diagnoses:  Principal Problem:   Pleural effusion Active Problems:   Paroxysmal A-fib (HCC)   Pericardial effusion   RA (rheumatoid arthritis) (HCC)   Discharge Instructions:  Activity:  As tolerated   Discharge Instructions     Diet - low sodium heart healthy   Complete by: As directed    Discharge instructions   Complete by: As directed    Follow with Primary MD  Daisy Floro, MD in 1-2 weeks  Follow-up with your primary rheumatologist and primary pulmonologist in the next 1-2 weeks.  Their office will call you with a follow-up appointment.  Numerous studies from your pleural fluid is still pending-please ask your primary care practitioner or your primary pulmonologist to follow-up on these results.  Please get a complete blood count and chemistry panel checked by your Primary MD at your next visit, and again as instructed by your  Primary MD.  Get Medicines reviewed and adjusted: Please take all your medications with you for your next visit with your Primary MD  Laboratory/radiological data: Please request your Primary MD to go over all hospital tests and procedure/radiological results at the follow  up, please ask your Primary MD to get all Hospital records sent to his/her office.  In some cases, they will be blood work, cultures and biopsy results pending at the time of your discharge. Please request that your primary care M.D. follows up on these results.  Also Note the following: If you experience worsening of your admission symptoms, develop shortness of breath, life threatening emergency, suicidal or homicidal thoughts you must seek medical attention immediately by calling 911 or calling your MD immediately  if symptoms less severe.  You must read complete instructions/literature along with all the possible adverse reactions/side effects for all the Medicines you take and that have been prescribed to you. Take any new Medicines after you have completely understood and accpet all the possible adverse reactions/side effects.   Do not drive when taking Pain medications or sleeping medications (Benzodaizepines)  Do not take more than prescribed Pain, Sleep and Anxiety Medications. It is not advisable to combine anxiety,sleep and pain medications without talking with your primary care practitioner  Special Instructions: If you have smoked or chewed Tobacco  in the last 2 yrs please stop smoking, stop any regular Alcohol  and or any Recreational drug use.  Wear Seat belts while driving.  Please note: You were cared for by a hospitalist during your hospital stay. Once you are discharged, your primary care physician will handle any further medical issues. Please note that NO REFILLS for any discharge medications will be authorized once you are discharged, as it is imperative that you return to your primary care physician (or establish a relationship with a primary care physician if you do not have one) for your post hospital discharge needs so that they can reassess your need for medications and monitor your lab values.   Increase activity slowly   Complete by: As directed    No dressing  needed   Complete by: As directed       Allergies as of 11/29/2021       Reactions   Erythromycin Other (See Comments)   Other reaction(s): burning   Sulfa Antibiotics    Other reaction(s): Unknown   Azithromycin Other (See Comments)   Pain in arm with IV infusion   Condrolite [glucosamine-chondroitin-msm] Rash   Glucosamine Rash   Other reaction(s): rash Other reaction(s): rash   Sulfamethoxazole-trimethoprim Rash   Other reaction(s): rash Other reaction(s): rash        Medication List     TAKE these medications    atovaquone 750 MG/5ML suspension Commonly known as: MEPRON Take 5 mLs (750 mg total) by mouth 2 (two) times daily with a meal.   azaTHIOprine 50 MG tablet Commonly known as: IMURAN TAKE 2 TABLETS BY MOUTH EVERY DAY   CALCIUM CITRATE-VITAMIN D PO Take 1 tablet by mouth at bedtime.   carboxymethylcellulose 0.5 % Soln Commonly known as: REFRESH PLUS Place 1 drop into both eyes 2 (two) times daily.   Eliquis 5 MG Tabs tablet Generic drug: apixaban TAKE 1 TABLET BY MOUTH TWICE A DAY What changed: how much to take   EpiPen 2-Pak 0.3 mg/0.3 mL Soaj injection Generic drug: EPINEPHrine Inject 0.3 mg into the muscle once as needed for anaphylaxis (severe allergic reaction).   furosemide 20 MG  tablet Commonly known as: LASIX Take 1 tablet (20 mg total) by mouth daily. Please call [phone] to schedule an appointment for future refills. Thank you. 1st attempt. Start taking on: December 02, 2021 What changed: These instructions start on December 02, 2021. If you are unsure what to do until then, ask your doctor or other care provider.   metoprolol tartrate 25 MG tablet Commonly known as: LOPRESSOR Take 1 tablet (25 mg total) by mouth 2 (two) times daily. May increase to twice daily as tolerated. What changed:  how much to take when to take this   multivitamin with minerals Tabs tablet Take 1 tablet by mouth every morning.   pantoprazole 40 MG  tablet Commonly known as: PROTONIX TAKE 1 TABLET BY MOUTH DAILY AT 12 NOON. What changed: See the new instructions.   potassium chloride 20 MEQ packet Commonly known as: KLOR-CON USE 1 PACKET ONCE DAILY What changed: See the new instructions.   predniSONE 10 MG tablet Commonly known as: DELTASONE Take 2 tablets (20 mg total) by mouth daily with breakfast. What changed:  how much to take Another medication with the same name was removed. Continue taking this medication, and follow the directions you see here.   PRESCRIPTION MEDICATION every 14 (fourteen) days. Allergy shots administered by Dr. Avondale Callas   PRESERVISION AREDS PO Take 1 capsule by mouth 2 (two) times daily.               Discharge Care Instructions  (From admission, onward)           Start     Ordered   11/29/21 0000  No dressing needed        11/29/21 1155            Follow-up Information     Martina Sinner, MD Follow up on 12/06/2021.   Specialty: Pulmonary Disease Why: Appt at 9:30. Please arrive at 9:15 for check in. Contact information: [address] [place] [address] [phone]         Daisy Floro, MD. Schedule an appointment as soon as possible for a visit in 1 week(s).   Specialty: Family Medicine Contact information: [address] [place] [address] [phone]         Corky Crafts, MD Follow up in 1 month(s).   Specialties: Cardiology, Radiology, Interventional Cardiology Contact information: [address] [place] [address] [phone]         Fuller Plan, MD. Schedule an appointment as soon as possible for a visit in 1 week(s).   Specialty: Rheumatology Contact information: [address] [place] [address] [phone]                Allergies  Allergen Reactions   Erythromycin Other (See Comments)    Other reaction(s): burning   Sulfa  Antibiotics     Other reaction(s): Unknown   Azithromycin Other (See Comments)    Pain in arm with IV infusion    Condrolite [Glucosamine-Chondroitin-Msm] Rash   Glucosamine Rash    Other reaction(s): rash Other reaction(s): rash   Sulfamethoxazole-Trimethoprim Rash    Other reaction(s): rash Other reaction(s): rash     Other Procedures/Studies: ECHOCARDIOGRAM LIMITED  Result Date: 11/29/2021    ECHOCARDIOGRAM LIMITED REPORT   Patient Name:   Rebecca Short Date of Exam: 11/29/2021 Medical Rec #:  [id number]     Height:       62.0 in Accession #:  [id number]    Weight:       173.5 lb Date of Birth:  02-Feb-1941     BSA:          1.800 m Patient Age:    80 years      BP:           110/70 mmHg Patient Gender: F             HR:           116 bpm. Exam Location:  Inpatient Procedure: Limited Echo and Limited Color Doppler Indications:    Peri effusion  History:        Patient has prior history of Echocardiogram examinations, most                 recent 01/20/2021. Arrythmias:Atrial Fibrillation.  Sonographer:    Rodrigo Ran RCS Referring Phys: [phone] Werner Lean [facility] IMPRESSIONS  1. Left ventricular ejection fraction, by estimation, is 55 to 60%. The left ventricle has normal function. Left ventricular diastolic parameters are indeterminate.  2. Right ventricular systolic function is mildly reduced. The right ventricular size is normal.  3. A small pericardial effusion is present.  4. The mitral valve is normal in structure. Trivial mitral valve regurgitation.  5. The aortic valve was not well visualized. Aortic valve regurgitation is not visualized.  6. The inferior vena cava is normal in size with greater than 50% respiratory variability, suggesting right atrial pressure of 3 mmHg. FINDINGS  Left Ventricle: Left ventricular ejection fraction, by estimation, is 55 to 60%. The left ventricle has normal function. The left ventricular internal cavity size was normal in size. There is no left ventricular  hypertrophy. Left ventricular diastolic parameters are indeterminate. Right Ventricle: The right ventricular size is normal. Right ventricular systolic function is mildly reduced. Right Atrium: Right atrial size was normal in size. Pericardium: A small pericardial effusion is present. Mitral Valve: The mitral valve is normal in structure. Trivial mitral valve regurgitation. Tricuspid Valve: The tricuspid valve is normal in structure. Tricuspid valve regurgitation is not demonstrated. Aortic Valve: The aortic valve was not well visualized. Aortic valve regurgitation is not visualized. Aorta: The aortic root and ascending aorta are structurally normal, with no evidence of dilitation. Venous: The inferior vena cava is normal in size with greater than 50% respiratory variability, suggesting right atrial pressure of 3 mmHg. IAS/Shunts: The interatrial septum was not well visualized. LEFT VENTRICLE PLAX 2D LVIDd:         3.90 cm Diastology LVIDs:         2.40 cm LV e' medial:  8.92 cm/s LV PW:         1.00 cm LV e' lateral: 7.51 cm/s LV IVS:        0.80 cm  RIGHT VENTRICLE            IVC RV Basal diam:  3.10 cm    IVC diam: 1.70 cm RV Mid diam:    2.65 cm RV S prime:     8.27 cm/s LEFT ATRIUM           Index        RIGHT ATRIUM           Index LA Vol (A4C): 56.8 ml 31.56 ml/m  RA Area:     19.70 cm  RA Volume:   43.20 ml  24.00 ml/m   AORTA Ao Asc diam: 3.50 cm Epifanio Lesches MD Electronically signed by Epifanio Lesches MD Signature Date/Time: 11/29/2021/4:15:54 PM    Final    CT CHEST W CONTRAST  Result Date: 11/28/2021 CLINICAL DATA:  Chronic left pleural effusion, 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 History of rheumatoid arthritis. EXAM: CT CHEST WITH CONTRAST TECHNIQUE: Multidetector CT imaging of the chest was performed during intravenous contrast administration. RADIATION DOSE REDUCTION: This exam was performed according to the departmental dose-optimization program which includes  automated exposure control, adjustment of the mA and/or kV according to patient size and/or use of iterative reconstruction technique. CONTRAST:  76mL OMNIPAQUE IOHEXOL 300 MG/ML  SOLN COMPARISON:  04/29/2021. FINDINGS: Cardiovascular: The heart is enlarged and there is a small pericardial effusion. There is atherosclerotic calcification of the aorta without evidence of aneurysm. The pulmonary trunk is distended which may be associated with underlying pulmonary artery hypertension. Mediastinum/Nodes: No mediastinal, hilar, or axillary lymphadenopathy. A calcified lymph node is noted at in the hilar region on the left. The thyroid gland, trachea, and esophagus are within normal limits. Lungs/Pleura: Small bilateral pleural effusions are noted, greater on the left than on the right. Strandy airspace disease is noted bilaterally, greater on the left than on the right. No pneumothorax. A left-sided chest drain is in place. Upper Abdomen: Fatty infiltration of the liver is noted. The gallbladder is surgically absent. Scattered hypodensities are present in the spleen, possible cysts. A cyst is present in the upper pole of the left kidney. Musculoskeletal: Degenerative changes are present in the thoracic spine. No acute osseous abnormality. IMPRESSION: 1. Small bilateral pleural effusions, greater on the right than on the left, with a left chest drain in place and not significantly changed from the prior exam. 2. Stable airspace disease is noted bilaterally, possible atelectasis. 3. Cardiomegaly with small pericardial effusion and coronary artery calcifications. 4. Distended pulmonary trunk suggesting underlying pulmonary artery hypertension. 5. Aortic atherosclerosis. Electronically Signed   By: Thornell Sartorius M.D.   On: 11/28/2021 03:50   DG Chest 2 View  Result Date: 11/26/2021 CLINICAL DATA:  History of pleural effusion, increased shortness of breath over the past week. EXAM: CHEST - 2 VIEW COMPARISON:  Chest x-ray  dated 10/22/2021. FINDINGS: Increased LEFT pleural effusion, now moderate to large in size. Probable small RIGHT pleural effusion. Heart size and mediastinal contours appear stable. No pneumothorax seen. No acute-appearing osseous abnormality. IMPRESSION: 1. Increased LEFT pleural effusion, now moderate to large in size. 2. Probable small RIGHT pleural effusion. Electronically Signed   By: Bary Richard M.D.   On: 11/26/2021 16:51     TODAY-DAY OF DISCHARGE:  Subjective:   Rebecca Short today has no headache,no chest abdominal pain,no new weakness tingling or numbness, feels much better wants to go home today.   Objective:   Blood pressure 127/86, pulse 97, temperature 97.7 F (36.5 C), temperature source Oral, resp. rate 20, weight 78.7 kg, SpO2 98 %.  Intake/Output Summary (Last 24 hours) at 11/29/2021 1632 Last data filed at 11/29/2021 0644 Gross per 24 hour  Intake 1000 ml  Output 190 ml  Net 810 ml   Filed Weights   11/29/21 0425  Weight: 78.7 kg    Exam: Awake Alert, Oriented *3, No new F.N deficits, Normal affect [place].AT,PERRAL Supple Neck,No JVD, No cervical lymphadenopathy appriciated.  Symmetrical Chest wall movement, Good air movement bilaterally, CTAB RRR,No Gallops,Rubs or new Murmurs, No Parasternal Heave +ve B.Sounds, Abd Soft, Non tender,  No organomegaly appriciated, No rebound -guarding or rigidity. No Cyanosis, Clubbing or edema, No new Rash or bruise   PERTINENT RADIOLOGIC STUDIES: ECHOCARDIOGRAM LIMITED  Result Date: 11/29/2021    ECHOCARDIOGRAM LIMITED REPORT   Patient Name:   Rebecca Short Date of Exam: 11/29/2021 Medical Rec #:  [id number]     Height:       62.0 in Accession #:    [id number]    Weight:       173.5 lb Date of Birth:  24-Aug-1940     BSA:          1.800 m Patient Age:    80 years      BP:           110/70 mmHg Patient Gender: F             HR:           116 bpm. Exam Location:  Inpatient Procedure: Limited Echo and Limited Color Doppler Indications:     Peri effusion  History:        Patient has prior history of Echocardiogram examinations, most                 recent 01/20/2021. Arrythmias:Atrial Fibrillation.  Sonographer:    Rodrigo Ran RCS Referring Phys: [phone] Werner Lean [facility] IMPRESSIONS  1. Left ventricular ejection fraction, by estimation, is 55 to 60%. The left ventricle has normal function. Left ventricular diastolic parameters are indeterminate.  2. Right ventricular systolic function is mildly reduced. The right ventricular size is normal.  3. A small pericardial effusion is present.  4. The mitral valve is normal in structure. Trivial mitral valve regurgitation.  5. The aortic valve was not well visualized. Aortic valve regurgitation is not visualized.  6. The inferior vena cava is normal in size with greater than 50% respiratory variability, suggesting right atrial pressure of 3 mmHg. FINDINGS  Left Ventricle: Left ventricular ejection fraction, by estimation, is 55 to 60%. The left ventricle has normal function. The left ventricular internal cavity size was normal in size. There is no left ventricular hypertrophy. Left ventricular diastolic parameters are indeterminate. Right Ventricle: The right ventricular size is normal. Right ventricular systolic function is mildly reduced. Right Atrium: Right atrial size was normal in size. Pericardium: A small pericardial effusion is present. Mitral Valve: The mitral valve is normal in structure. Trivial mitral valve regurgitation. Tricuspid Valve: The tricuspid valve is normal in structure. Tricuspid valve regurgitation is not demonstrated. Aortic Valve: The aortic valve was not well visualized. Aortic valve regurgitation is not visualized. Aorta: The aortic root and ascending aorta are structurally normal, with no evidence of dilitation. Venous: The inferior vena cava is normal in size with greater than 50% respiratory variability, suggesting right atrial pressure of 3 mmHg. IAS/Shunts: The interatrial  septum was not well visualized. LEFT VENTRICLE PLAX 2D LVIDd:         3.90 cm Diastology LVIDs:         2.40 cm LV e' medial:  8.92 cm/s LV PW:         1.00 cm LV e' lateral: 7.51 cm/s LV IVS:        0.80 cm  RIGHT VENTRICLE            IVC RV Basal diam:  3.10 cm    IVC diam: 1.70 cm RV Mid diam:    2.65 cm RV S prime:     8.27 cm/s LEFT ATRIUM  Index        RIGHT ATRIUM           Index LA Vol (A4C): 56.8 ml 31.56 ml/m  RA Area:     19.70 cm                                    RA Volume:   43.20 ml  24.00 ml/m   AORTA Ao Asc diam: 3.50 cm Epifanio Lesches MD Electronically signed by Epifanio Lesches MD Signature Date/Time: 11/29/2021/4:15:54 PM    Final    CT CHEST W CONTRAST  Result Date: 11/28/2021 CLINICAL DATA:  Chronic left pleural effusion, 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 History of rheumatoid arthritis. EXAM: CT CHEST WITH CONTRAST TECHNIQUE: Multidetector CT imaging of the chest was performed during intravenous contrast administration. RADIATION DOSE REDUCTION: This exam was performed according to the departmental dose-optimization program which includes automated exposure control, adjustment of the mA and/or kV according to patient size and/or use of iterative reconstruction technique. CONTRAST:  75mL OMNIPAQUE IOHEXOL 300 MG/ML  SOLN COMPARISON:  04/29/2021. FINDINGS: Cardiovascular: The heart is enlarged and there is a small pericardial effusion. There is atherosclerotic calcification of the aorta without evidence of aneurysm. The pulmonary trunk is distended which may be associated with underlying pulmonary artery hypertension. Mediastinum/Nodes: No mediastinal, hilar, or axillary lymphadenopathy. A calcified lymph node is noted at in the hilar region on the left. The thyroid gland, trachea, and esophagus are within normal limits. Lungs/Pleura: Small bilateral pleural effusions are noted, greater on the left than on the right. Strandy airspace disease is noted bilaterally, greater on the left  than on the right. No pneumothorax. A left-sided chest drain is in place. Upper Abdomen: Fatty infiltration of the liver is noted. The gallbladder is surgically absent. Scattered hypodensities are present in the spleen, possible cysts. A cyst is present in the upper pole of the left kidney. Musculoskeletal: Degenerative changes are present in the thoracic spine. No acute osseous abnormality. IMPRESSION: 1. Small bilateral pleural effusions, greater on the right than on the left, with a left chest drain in place and not significantly changed from the prior exam. 2. Stable airspace disease is noted bilaterally, possible atelectasis. 3. Cardiomegaly with small pericardial effusion and coronary artery calcifications. 4. Distended pulmonary trunk suggesting underlying pulmonary artery hypertension. 5. Aortic atherosclerosis. Electronically Signed   By: Thornell Sartorius M.D.   On: 11/28/2021 03:50     PERTINENT LAB RESULTS: CBC: Recent Labs    11/28/21 0533 11/29/21 0103  WBC 5.8 7.4  HGB 12.6 12.1  HCT 37.5 35.4*  PLT 261 290   CMET CMP     Component Value Date/Time   NA 138 11/29/2021 0103   NA 139 02/06/2021 1557   K 3.5 11/29/2021 0103   CL 106 11/29/2021 0103   CO2 27 11/29/2021 0103   GLUCOSE 178 (H) 11/29/2021 0103   BUN 15 11/29/2021 0103   BUN 27 02/06/2021 1557   CREATININE 0.73 11/29/2021 0103   CREATININE 0.88 09/27/2021 1622   CALCIUM 8.2 (L) 11/29/2021 0103   PROT 5.1 (L) 11/27/2021 1338   PROT 6.1 08/19/2017 0816   ALBUMIN 2.7 (L) 11/26/2021 1817   ALBUMIN 3.8 08/19/2017 0816   AST 17 11/26/2021 1817   ALT 15 11/26/2021 1817   ALKPHOS 43 11/26/2021 1817   BILITOT 0.5 11/26/2021 1817   BILITOT 0.6 08/19/2017 0816   GFRNONAA >60 11/29/2021 0103  GFRAA 90 12/27/2018 0831    GFR Estimated Creatinine Clearance: 53.5 mL/min (by C-G formula based on SCr of 0.73 mg/dL). No results for input(s): "LIPASE", "AMYLASE" in the last 72 hours. Recent Labs    11/26/21 1817   CKTOTAL 14*   Invalid input(s): "POCBNP" No results for input(s): "DDIMER" in the last 72 hours. No results for input(s): "HGBA1C" in the last 72 hours. No results for input(s): "CHOL", "HDL", "LDLCALC", "TRIG", "CHOLHDL", "LDLDIRECT" in the last 72 hours. No results for input(s): "TSH", "T4TOTAL", "T3FREE", "THYROIDAB" in the last 72 hours.  Invalid input(s): "FREET3" No results for input(s): "VITAMINB12", "FOLATE", "FERRITIN", "TIBC", "IRON", "RETICCTPCT" in the last 72 hours. Coags: Recent Labs    11/26/21 1817  INR 1.1   Microbiology: Recent Results (from the past 240 hour(s))  Culture, blood (Routine X 2) w Reflex to ID Panel     Status: None (Preliminary result)   Collection Time: 11/26/21  6:43 PM   Specimen: BLOOD  Result Value Ref Range Status   Specimen Description BLOOD SITE NOT SPECIFIED  Final   Special Requests   Final    BOTTLES DRAWN AEROBIC AND ANAEROBIC Blood Culture adequate volume   Culture   Final    NO GROWTH 3 DAYS Performed at [facility] [facility], [address] [place], [place] [address]    Report Status PENDING  Incomplete  Culture, blood (Routine X 2) w Reflex to ID Panel     Status: None (Preliminary result)   Collection Time: 11/26/21  6:43 PM   Specimen: BLOOD  Result Value Ref Range Status   Specimen Description BLOOD SITE NOT SPECIFIED  Final   Special Requests   Final    BOTTLES DRAWN AEROBIC AND ANAEROBIC Blood Culture results may not be optimal due to an excessive volume of blood received in culture bottles   Culture   Final    NO GROWTH 3 DAYS Performed at [facility], [address] [place], [place] [address]    Report Status PENDING  Incomplete  Body fluid culture w Gram Stain     Status: None (Preliminary result)   Collection Time: 11/27/21  1:37 PM   Specimen: Pleural Fluid  Result Value Ref Range Status   Specimen Description PLEURAL  Final   Special Requests NONE  Final   Gram Stain   Final    WBC PRESENT,BOTH PMN  AND MONONUCLEAR NO ORGANISMS SEEN CYTOSPIN SMEAR    Culture   Final    NO GROWTH 2 DAYS Performed at [facility], [address] [place], [place] [address]    Report Status PENDING  Incomplete    FURTHER DISCHARGE INSTRUCTIONS:  Get Medicines reviewed and adjusted: Please take all your medications with you for your next visit with your Primary MD  Laboratory/radiological data: Please request your Primary MD to go over all hospital tests and procedure/radiological results at the follow up, please ask your Primary MD to get all Hospital records sent to his/her office.  In some cases, they will be blood work, cultures and biopsy results pending at the time of your discharge. Please request that your primary care M.D. goes through all the records of your hospital data and follows up on these results.  Also Note the following: If you experience worsening of your admission symptoms, develop shortness of breath, life threatening emergency, suicidal or homicidal thoughts you must seek medical attention immediately by calling 911 or calling your MD immediately  if symptoms  less severe.  You must read complete instructions/literature along with all the possible adverse reactions/side effects for all the Medicines you take and that have been prescribed to you. Take any new Medicines after you have completely understood and accpet all the possible adverse reactions/side effects.   Do not drive when taking Pain medications or sleeping medications (Benzodaizepines)  Do not take more than prescribed Pain, Sleep and Anxiety Medications. It is not advisable to combine anxiety,sleep and pain medications without talking with your primary care practitioner  Special Instructions: If you have smoked or chewed Tobacco  in the last 2 yrs please stop smoking, stop any regular Alcohol  and or any Recreational drug use.  Wear Seat belts while driving.  Please note: You were cared for by a hospitalist  during your hospital stay. Once you are discharged, your primary care physician will handle any further medical issues. Please note that NO REFILLS for any discharge medications will be authorized once you are discharged, as it is imperative that you return to your primary care physician (or establish a relationship with a primary care physician if you do not have one) for your post hospital discharge needs so that they can reassess your need for medications and monitor your lab values.  Total Time spent coordinating discharge including counseling, education and face to face time equals greater than 30 minutes.  SignedJeoffrey Massed 11/29/2021 4:32 PM

## 2021-11-29 NOTE — Care Management Important Message (Signed)
Important Message  Patient Details  Name: Rebecca Short MRN: [id number] Date of Birth: 06/26/1940   Medicare Important Message Given:  Yes     Dorena Bodo 11/29/2021, 3:08 PM

## 2021-11-30 LAB — CHOLESTEROL, BODY FLUID: Cholesterol, Fluid: 64 mg/dL

## 2021-11-30 LAB — QUANTIFERON-TB GOLD PLUS (RQFGPL)
QuantiFERON Mitogen Value: >10 IU/mL
QuantiFERON Nil Value: 0 IU/mL
QuantiFERON TB1 Ag Value: 0 IU/mL
QuantiFERON TB2 Ag Value: 0 IU/mL

## 2021-11-30 LAB — BODY FLUID CULTURE W GRAM STAIN: Culture: NO GROWTH

## 2021-11-30 LAB — QUANTIFERON-TB GOLD PLUS: QuantiFERON-TB Gold Plus: NEGATIVE

## 2021-12-01 LAB — CULTURE, BLOOD (ROUTINE X 2)
Culture: NO GROWTH
Culture: NO GROWTH
Special Requests: ADEQUATE

## 2021-12-02 ENCOUNTER — Telehealth: Payer: Self-pay | Admitting: Internal Medicine

## 2021-12-02 LAB — MISC LABCORP TEST (SEND OUT): Labcorp test code: 9985

## 2021-12-02 NOTE — Telephone Encounter (Signed)
Patient called stating she was discharged from the hospital on Friday, 11/29/21 following pleural effusion.  Patient states her discharge paperwork states she is to follow-up with Dr. Dimple Casey in 1 week.  Patient informed that Dr. Dimple Casey is out of the office (paternity leave) until 12/30/21.  Patient states she feels this is related to her rheumatology issues and requested a return call immediately about being seen.  Patient states "there must be a provider that she can see in cases of emergencies."

## 2021-12-03 NOTE — Telephone Encounter (Signed)
Attempted to contact the patient and left message for patient to call the office.  

## 2021-12-03 NOTE — Telephone Encounter (Signed)
Sorry to hear about this hospitalization. I have messaged Dr. Francine Graven to discuss the plan. I will probably recommend switching her medication because of her symptoms returning so significantly. Will reach out again about the details after his input.

## 2021-12-03 NOTE — Telephone Encounter (Signed)
I called patient, patient verbalized understanding. 

## 2021-12-05 LAB — ACID FAST SMEAR (AFB, MYCOBACTERIA): Acid Fast Smear: NEGATIVE

## 2021-12-06 ENCOUNTER — Ambulatory Visit (INDEPENDENT_AMBULATORY_CARE_PROVIDER_SITE_OTHER): Payer: Medicare Other

## 2021-12-06 ENCOUNTER — Ambulatory Visit (INDEPENDENT_AMBULATORY_CARE_PROVIDER_SITE_OTHER): Payer: Medicare Other | Admitting: Pulmonary Disease

## 2021-12-06 ENCOUNTER — Other Ambulatory Visit: Payer: Self-pay

## 2021-12-06 ENCOUNTER — Encounter: Payer: Self-pay | Admitting: Pulmonary Disease

## 2021-12-06 VITALS — BP 120/72 | HR 89 | Ht 62.0 in | Wt 162.8 lb

## 2021-12-06 DIAGNOSIS — J90 Pleural effusion, not elsewhere classified: Secondary | ICD-10-CM

## 2021-12-06 DIAGNOSIS — R768 Other specified abnormal immunological findings in serum: Secondary | ICD-10-CM | POA: Diagnosis not present

## 2021-12-06 NOTE — Progress Notes (Unsigned)
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   She was tapered down to  of prednisone daily on 6/12. She reported  progressive fatigue and shortness of breath over week on 7/3. Chest x-ray showed an increase in her left pleural effusion as well as return of a small right pleural effusion. She was admitted 7/4 and left pigtail chest tube was placed on 7/5. She drained a total of of fluid with minimal drainage there after. CT chest scan after chest tube placement showed LLL infiltrate vs atelecatsis and resolution of the effusion on the left. There was no need for pleural lytic therapy and her lung expanded appropriately. The effusion was again exudative. Cytology was negative for malignancy.  Her prednisone was increased back to  daily an she continued on azathioprine  daily. She is feeling much better since before the hospital.  Dr. Dimple Casey of rheumatology was notified. We discussed transition to methotrexate from azathioprine.   Her pleural fluid ADA was not elevated and the AFB smear on pleural fluid is negative. Quantiferon gold is negative.   Chest radiograph today shows return of left pleural effusion, but not as significant as her 7/3 chest x-ray. Right effusion has decreased or resolved.   OV 09/3021 She was seen by [name], NP on 09/27/21 for acute visit due to worsening joint pains and concern for worsening of her pleural effusion. Chest radiograph was overall stable compared to March. It was recommended she  continue on the scheduled prednisone taper.   Since that visit she has not had further issues with joint pains or return of dyspnea. She overall feels well. She is currently taking  prednisone daily and has stopped atovaquone antibiotic.  She complains of ear fullness and having trouble hearing and is scheduled to see ENT next month.   OV 09/13/21 She remains on slow prednisone taper and is down to  daily along with atovaquone prophylaxis. She saw Dr. Dimple Casey of rheumatology on 4/17 with no changes to her regimen.   She continues to feel well and continues her weekly exercise and strength regimen. She feels her stamina continues to improve.  OV 08/20/21 She continues to do well since last visit and her stamina is increasing and her balance is getting better. She is getting out to walk in the local parks. She is currently taking  prednisone daily and atovaquone daily.   OV 07/23/21 She continues on imuran therapy  daily which has been increased from  daily by Dr. Dimple Casey her rheumatologist, note from 07/09/21 reviewed. She has started her prednisone taper and is currently taking  daily. She remains on atovaquone prophylaxis. 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  She has done well since last visit. No increase in shortness of breath. She continues to progress with her physical activity.  OV 06/25/21 She has been doing well since last visit. She was started on imuran per Dr. Dimple Casey and she has follow up with him on 07/09/21. She remains on   of prednisone daily. She is working with home PT with good results. She reports her daily activities are increasing as she is now able to vacuum 2 rooms in her home compared to 1 previously.   Chest radiograph today shows chronic left pleural effusion, stable from last month.  OV 05/22/21 She was evaluated by Dr. Dimple Casey of Rheumatology on 05/08/21 with further workup for her  inflammatory pleural disease. Anti-smooth muscle anitbody is elevated. Cryoglobulins are negative.   Discussed pleural fluid studies further with Dr. Kenard Gower which showed mixed granulocytes and histioctyes along with some multinucleated giant cells. A definite diagnosis can't be placed on these findings, but he did raise concern for rheumatoid arthritis.   These upates were shared with the patient, her husband and friend.   Chest x-ray today shows persistent left pleural effusion and clear right lung.   Patient is feeling much better after increasing her prednisone back to 40mg  daily. She remains on prophylactic antibiotics. Her shoulder pain has also resolved with the prednisone.   She continues to work with PT each week and feels her conditioning in improving.  OV 04/24/21 She was seen in acute visit on 11/28 by [name], NP for increasing shortness of breath and pleuritic chest pain. She had tapered down to 5mg  of prednisone at this time. She had repeat thoracentesis on 04/08/21 based on CT chest from 04/22/21 which continued to show persistent left pleural effusion. Post-thoracentesis x-ray showed left pleural effusion and small right pleural effusion. Chest x-ray on 11/28 shows increased left pleural effusion.   Patient complains of increasing shortness of breath, bilateral shoulder pain and increasing left-sided discomfort.  Labs from visit on 11/16 showed rheumatoid factor I30 previously 57.2 on 01/21/2021.  P ANCA titer was 1:80 on 04/10/2021.  The following labs were negative on 04/10/2021: ANA, anti-CCP, dsDNA antibody and previously negative for SSA and SSB and antihistone antibodies.  OV 03/06/21 She was admitted 8/27 to 9/7 for pericardial effusion and bilateral pleural effusions. She is status post pericardial drain and multiple thoracenteses. The pleural fluid studies were consistent with exudative process which were initially neutrophil predominant that later became lymphocyte  predominant. Cytology was negative for malignancy but showed reactive mesothelial cells.  She is being treated for serositis with prednisone taper and colchicine for the pericardial effusion. She has been on 40mg  of prednisone over the past month. She has been on pneumocystis prophylaxis with atovaquone, she developed skin rash with bactrim. Her inflammatory workup was rather unrevealing as ANA, CCP anti-histone ab and SSA/SSB were negative. Her rheumatoid factor was elevated at 57.2.   She is feeling significantly better since discharge is resuming her normal activity and is quite satisfied with her recovering thus far.   Chest radiograph today shows opacities at the left lung base. The right lung is significantly improved since prior study with resolution of right pleural effusion.  Past Medical History:  Diagnosis Date   A-fib [facility])    Allergy    Atrial fibrillation (HCC)    FH: cholecystectomy 1997   Macular degeneration    OSA (obstructive sleep apnea)    mild with total AHI 7.25/hr and severe during REM sleep at 35/hr now on CPAP at 6cm H2O     Family History  Problem Relation Age of Onset   Osteoporosis Mother    Macular degeneration Mother    Heart Problems Father    Stroke Father    Diabetes Brother    Kidney failure Brother    Cancer  Maternal Grandmother    Dementia Paternal Grandmother    Suicidality Other    Heart attack Neg Hx      Social History   Socioeconomic History   Marital status: Married    Spouse name: Not on file   Number of children: Not on file   Years of education: Not on file   Highest education level: Not on file  Occupational History   Not on file  Tobacco Use   Smoking status: Never   Smokeless tobacco: Never  Vaping Use   Vaping Use: Never used  Substance and Sexual Activity   Alcohol use: Never   Drug use: Never   Sexual activity: Not on file  Other Topics Concern   Not on file  Social History Narrative   ** Merged History  Encounter **       Social Determinants of Health   Financial Resource Strain: Not on file  Food Insecurity: Not on file  Transportation Needs: Not on file  Physical Activity: Not on file  Stress: Not on file  Social Connections: Not on file  Intimate Partner Violence: Not on file     Allergies  Allergen Reactions   Erythromycin Other (See Comments)    Other reaction(s): burning   Sulfa Antibiotics     Other reaction(s): Unknown   Azithromycin Other (See Comments)    Pain in arm with IV infusion    Condrolite [Glucosamine-Chondroitin-Msm] Rash   Glucosamine Rash    Other reaction(s): rash Other reaction(s): rash   Sulfamethoxazole-Trimethoprim Rash    Other reaction(s): rash Other reaction(s): rash     Outpatient Medications Prior to Visit  Medication Sig Dispense Refill   apixaban (ELIQUIS) 5 MG TABS tablet TAKE 1 TABLET BY MOUTH TWICE A DAY (Patient taking differently: Take 5 mg by mouth 2 (two) times daily.) 60 tablet 6   atovaquone (MEPRON) 750 MG/5ML suspension Take 5 mLs (750 mg total) by mouth 2 (two) times daily with a meal. 300 mL 2   azaTHIOprine (IMURAN) 50 MG tablet TAKE 2 TABLETS BY MOUTH EVERY DAY (Patient taking differently: Take 100 mg by mouth daily.) 180 tablet 1   CALCIUM CITRATE-VITAMIN D PO Take 1 tablet by mouth at bedtime.     carboxymethylcellulose (REFRESH PLUS) 0.5 % SOLN Place 1 drop into both eyes 2 (two) times daily.     EPIPEN 2-PAK 0.3 MG/0.3ML SOAJ injection Inject 0.3 mg into the muscle once as needed for anaphylaxis (severe allergic reaction).  0   furosemide (LASIX) 20 MG tablet Take 1 tablet (20 mg total) by mouth daily. Please call [phone] to schedule an appointment for future refills. Thank you. 1st attempt. 30 tablet 0   metoprolol tartrate (LOPRESSOR) 25 MG tablet Take 1 tablet (25 mg total) by mouth 2 (two) times daily. May increase to twice daily as tolerated. 60 tablet 1   Multiple Vitamin (MULTIVITAMIN WITH MINERALS) TABS  tablet Take 1 tablet by mouth every morning.     Multiple Vitamins-Minerals (PRESERVISION AREDS PO) Take 1 capsule by mouth 2 (two) times daily.     pantoprazole (PROTONIX) 40 MG tablet TAKE 1 TABLET BY MOUTH DAILY AT 12 NOON. (Patient taking differently: Take 40 mg by mouth daily at 12 noon.) 90 tablet 3   potassium chloride (KLOR-CON) 20 MEQ packet USE 1 PACKET ONCE DAILY (Patient taking differently: Take 20 mEq by mouth daily.) 90 packet 2   predniSONE (DELTASONE) 10 MG tablet Take 2 tablets (20 mg total) by mouth daily  with breakfast. 60 tablet 3   PRESCRIPTION MEDICATION every 14 (fourteen) days. Allergy shots administered by Dr. [place] Callas     No facility-administered medications prior to visit.    Review of Systems  Constitutional:  Negative for chills, fever, malaise/fatigue and weight loss.  HENT:  Negative for congestion, sinus pain and sore throat.   Eyes: Negative.   Respiratory:  Positive for shortness of breath (with exertion). Negative for cough, hemoptysis, sputum production and wheezing.   Cardiovascular:  Negative for chest pain, palpitations, orthopnea, claudication and leg swelling.  Gastrointestinal:  Negative for abdominal pain, heartburn, nausea and vomiting.  Genitourinary: Negative.   Musculoskeletal:  Negative for joint pain and myalgias.  Skin:  Negative for rash.  Neurological:  Negative for weakness.  Endo/Heme/Allergies:  Bruises/bleeds easily.  Psychiatric/Behavioral: Negative.      Objective:   Vitals:   12/06/21 0938  BP: 120/72  Pulse: 89  SpO2: 100%  Weight: 162 lb 12.8 oz (73.8 kg)  Height:  (1.575 m)    Physical Exam Constitutional:      General: She is not in acute distress.    Appearance: She is not ill-appearing.  HENT:     Head: Normocephalic and atraumatic.  Cardiovascular:     Rate and Rhythm: Normal rate and regular rhythm.     Pulses: Normal pulses.     Heart sounds: Normal heart sounds. No murmur heard. Pulmonary:      Effort: Pulmonary effort is normal.     Breath sounds: Examination of the left-middle field reveals decreased breath sounds. Examination of the left-lower field reveals decreased breath sounds. Decreased breath sounds present. No wheezing, rhonchi or rales.  Musculoskeletal:     Right lower leg: No edema.     Left lower leg: No edema.  Skin:    General: Skin is warm and dry.     Findings: Bruising (on hands b/l) present.  Neurological:     General: No focal deficit present.     Mental Status: She is alert.  Psychiatric:        Mood and Affect: Mood normal.        Behavior: Behavior normal.        Thought Content: Thought content normal.        Judgment: Judgment normal.    CBC    Component Value Date/Time   WBC 7.4 11/29/2021 0103   RBC 3.60 (L) 11/29/2021 0103   HGB 12.1 11/29/2021 0103   HGB 15.7 12/27/2018 0831   HCT 35.4 (L) 11/29/2021 0103   HCT 47.4 (H) 12/27/2018 0831   PLT 290 11/29/2021 0103   PLT 213 12/27/2018 0831   MCV 98.3 11/29/2021 0103   MCV 90 12/27/2018 0831   MCH 33.6 11/29/2021 0103   MCHC 34.2 11/29/2021 0103   RDW 14.6 11/29/2021 0103   RDW 13.1 12/27/2018 0831   LYMPHSABS 0.6 (L) 11/26/2021 1817   MONOABS 0.7 11/26/2021 1817   EOSABS 0.0 11/26/2021 1817   BASOSABS 0.0 11/26/2021 1817      Latest Ref Rng & Units 11/29/2021    1:03 AM 11/28/2021    5:33 AM 11/26/2021    6:17 PM  BMP  Glucose 70 - 99 mg/dL 161  89  096   BUN 8 - 23 mg/dL Creatinine 0.44 - 1.00 mg/dL 0.45  4.09  8.11   Sodium 135 - 145 mmol/L 138  138  134   Potassium 3.5 - 5.1 mmol/L  3.5  3.7  4.1   Chloride 98 - 111 mmol/L 106  102  99   CO2 22 - 32 mmol/L 27  30  23    Calcium 8.9 - 10.3 mg/dL 8.2  8.6  8.5    Chest imaging: CXR 12/06/21 Small left pleural effusion, decreased in size when compared with prior exam. Similar trace right pleural effusion.  CXR 11/25/21 1. Increased LEFT pleural effusion, now moderate to large in size. 2. Probable small RIGHT pleural  effusion.  CXR 10/22/21 Stable left effusion. No blunting of the right costophrenic angle.  CXR 09/27/21 Mild-to-moderate cardiomegaly. Again seen is the obscuration of the left cardiac border and hemidiaphragm of pleural effusion with likely superimposed atelectasis/consolidation without significant interval change. There is some blunting of the right CP angle seen of likely small pleural effusion  CXR 08/20/21 Similar appearance of the chest x-ray with left-sided pleural effusion and associated atelectasis/consolidation  CXR 06/25/21 1. Stable small left pleural effusion. 2. No new focal lung infiltrate.  CXR 05/22/21 There is a small to medium size left pleural effusion, not significantly changed since 04/24/2021. Aeration of the left lung is also not significantly changed. The previously seen small right effusion has resolved. The right lung is now clear. There is no pneumothorax.  CXR 04/22/21 Increasing LEFT effusion and associated airspace disease. Correlate with any signs of infection. Given persistence/worsening underlying pulmonary or bronchial lesion would be difficult to exclude. Correlate with results of prior thoracentesis and consider follow-up chest CT for further evaluation as warranted.   Trace RIGHT effusion with basilar atelectasis.  CXR 03/06/21 Resolved right-sided pleural effusion with persistent opacity on the left, likely a combination of persisting pleural fluid and associated atelectasis/consolidation  CXR 01/30/21 1. Slight interval decrease in size of the right pleural effusion with improved aeration of the right base. 2. No significant interval change of the larger left pleural effusion and adjacent airspace disease.  CT Chest 01/03/21 Moderate left and small right pleural effusions, which is increased on the left and new on the right in comparison to prior CT. Adjacent bibasilar and lingular atelectasis.   Increased, moderate-sized pericardial  effusion. Biatrial enlargement.   Mild pulmonary edema.  PFT:     No data to display          Labs: 01/21/21: Quantiferon gold indeterminate  Path:  Echo 01/20/21:  1. Left ventricular ejection fraction, by estimation, is 60 to 65%. The  left ventricle has normal function. The left ventricle has no regional  wall motion abnormalities.   2. A small pericardial effusion is present. There is no evidence of  cardiac tamponade.  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  Her left pleural effusion has returned since chest tube placement and is stable compared to prior chest x-rays. We will refer her to Dr. 94 for evaluation of pleural biopsy and if that would contribute to making a more confirmatory diagnosis for her.   She is to remain on 20mg  of prednisone until 7/20 then taper down to 15mg  until further direction. She is to continue azathioprine 100mg  daily until further direction by Dr. . Quantiferon gold is negative, ADA level on pleural  fluid is low and AFB smear is negative on pleural fluid. I think it is safe to transition to methotrexate.   Follow-up in 1 month.  Melody Comas, MD [facility] Office: [phone]   Current Outpatient Medications:    apixaban (ELIQUIS) 5 MG TABS tablet, TAKE 1 TABLET BY MOUTH TWICE A DAY (Patient taking differently: Take 5 mg by mouth 2 (two) times daily.), Disp: 60 tablet, Rfl: 6   atovaquone (MEPRON) 750 MG/5ML suspension, Take 5 mLs (750 mg total) by mouth 2 (two) times daily with a meal., Disp: 300 mL, Rfl: 2   azaTHIOprine (IMURAN) 50 MG tablet,  TAKE 2 TABLETS BY MOUTH EVERY DAY (Patient taking differently: Take 100 mg by mouth daily.), Disp: 180 tablet, Rfl: 1   CALCIUM CITRATE-VITAMIN D PO, Take 1 tablet by mouth at bedtime., Disp: , Rfl:    carboxymethylcellulose (REFRESH PLUS) 0.5 % SOLN, Place 1 drop into both eyes 2 (two) times daily., Disp: , Rfl:    EPIPEN 2-PAK 0.3 MG/0.3ML SOAJ injection, Inject 0.3 mg into the muscle once as needed for anaphylaxis (severe allergic reaction)., Disp: , Rfl: 0   furosemide (LASIX) 20 MG tablet, Take 1 tablet (20 mg total) by mouth daily. Please call [phone] to schedule an appointment for future refills. Thank you. 1st attempt., Disp: 30 tablet, Rfl: 0   metoprolol tartrate (LOPRESSOR) 25 MG tablet, Take 1 tablet (25 mg total) by mouth 2 (two) times daily. May increase to twice daily as tolerated., Disp: 60 tablet, Rfl: 1   Multiple Vitamin (MULTIVITAMIN WITH MINERALS) TABS tablet, Take 1 tablet by mouth every morning., Disp: , Rfl:    Multiple Vitamins-Minerals (PRESERVISION AREDS PO), Take 1 capsule by mouth 2 (two) times daily., Disp: , Rfl:    pantoprazole (PROTONIX) 40 MG tablet, TAKE 1 TABLET BY MOUTH DAILY AT 12 NOON. (Patient taking differently: Take 40 mg by mouth daily at 12 noon.), Disp: 90 tablet, Rfl: 3   potassium chloride (KLOR-CON) 20 MEQ packet, USE 1 PACKET ONCE DAILY (Patient taking differently: Take 20 mEq by mouth daily.), Disp: 90 packet, Rfl: 2   predniSONE (DELTASONE) 10 MG tablet, Take 2 tablets (20 mg total) by mouth daily with breakfast., Disp: 60 tablet, Rfl: 3   PRESCRIPTION MEDICATION, every 14 (fourteen) days. Allergy shots administered by Dr. [place] Callas, Disp: , Rfl:

## 2021-12-06 NOTE — Patient Instructions (Addendum)
Continue azathioprine 100mg  daily  Follow up with Dr. about transition to methorexate  Continue 20mg  prednisone daily until 7/20 then taper to 15mg  daily until further direction.  You can take atovaquone daily until tapered down to 15mg  daily.  We will refer you to Dr. of cardiothoracic surgery to consider pleural biopsy.   Follow up in 1 month

## 2021-12-07 ENCOUNTER — Encounter: Payer: Self-pay | Admitting: Pulmonary Disease

## 2021-12-09 ENCOUNTER — Other Ambulatory Visit (HOSPITAL_COMMUNITY): Payer: Self-pay

## 2021-12-09 ENCOUNTER — Other Ambulatory Visit: Payer: Self-pay | Admitting: Internal Medicine

## 2021-12-09 DIAGNOSIS — Z79899 Other long term (current) drug therapy: Secondary | ICD-10-CM

## 2021-12-09 NOTE — Telephone Encounter (Signed)
Patient called the office stating she spoke with Dr. Francine Graven and he stated that he has been speaking with Dr. Dimple Casey about the patient starting Methotrexate. Patient was insistent about starting the medication before her appointment on August 7th with Dr. Dimple Casey. Patient demanded I "text" Dr. Dimple Casey so that she can have the prescription sent in soon. Patient states she does not want to wait until 12/30/21 to start the medication.

## 2021-12-10 NOTE — Telephone Encounter (Signed)
I spoke with Rebecca Short based on her recent hospitalization with pleural effusions after prednisone taper to 10 mg I consider this a failure of azathioprine. We can switch to methotrexate as an alternative. She needs baseline labs to screen for viral hepatitis. Otherwise already has recent labs and imaging that are okay. I recommend she come by the clinic for these labs and if negative can start medication and placed orders for this today.  Starting dose would be methotrexate 15 mg PO weekly and folic acid 1 mg daily.   I discussed risks of medication with her including hepatotoxicity, cytopenias, GI upset, infections, or reaction such as rash or mucositis.

## 2021-12-11 ENCOUNTER — Other Ambulatory Visit: Payer: Self-pay | Admitting: *Deleted

## 2021-12-11 DIAGNOSIS — Z79899 Other long term (current) drug therapy: Secondary | ICD-10-CM

## 2021-12-12 ENCOUNTER — Other Ambulatory Visit (HOSPITAL_COMMUNITY): Payer: Self-pay

## 2021-12-12 ENCOUNTER — Telehealth (HOSPITAL_COMMUNITY): Payer: Self-pay

## 2021-12-12 ENCOUNTER — Telehealth: Payer: Self-pay | Admitting: Pulmonary Disease

## 2021-12-12 LAB — HEPATITIS B CORE ANTIBODY, IGM: Hep B C IgM: NONREACTIVE

## 2021-12-12 LAB — HEPATITIS C ANTIBODY: Hepatitis C Ab: NONREACTIVE

## 2021-12-12 LAB — HEPATITIS B SURFACE ANTIGEN: Hepatitis B Surface Ag: NONREACTIVE

## 2021-12-12 NOTE — Telephone Encounter (Signed)
Pharmacy Transitions of Care Follow-up Telephone Call  Date of discharge: 11/29/2021  Discharge Diagnosis: Pleural Effusion   How have you been since you were released from the hospital? Patient has been doing well since discharge. As of today, 12/12/2021, she is no longer taking atovaquone for prophylaxis due to taper of prednisone from 20 mg to 15 mg as instructed by Melody Comas, MD on 12/06/2021. Patient has no questions or concerns regarding her medications at this time.   Medication changes made at discharge: CHANGE how you take: metoprolol tartrate (LOPRESSOR)  predniSONE (DELTASONE)   Medication changes verified by the patient? Yes  Medication Accessibility:  Home [facility]   Was the patient provided with refills on discharged medications? Yes   Have all prescriptions been transferred from [facility] to home pharmacy? Yes   Is the patient able to afford medications? Has insurance, Medicare   Follow-up Appointments:  [facility]t confirmed? Saw Melody Comas, MD on 12/06/2021 Scheduled to see Sheliah Hatch, MD on 12/30/2021 @ 1:20 PM.   If their condition worsens, is the pt aware to call PCP or go to the Emergency Dept.? Yes  Final Patient Assessment: Patient has refills sent to home pharmacy and has already had follow-up.

## 2021-12-12 NOTE — Telephone Encounter (Signed)
Called and spoke with patient. She wanted to clarify that she is not asking for a 2nd opinion pulmonary wise. She had an appt with her PCP Dr. Tenny Craw this morning and he has decided to refer her to Dr. Alben Deeds. She and Dr. Tenny Craw have continued to get frustrated with Dr. Dimple Casey not being available and not board certified in rheumatology. She wanted to make Dr. Francine Graven aware in case he starts receiving paperwork or a call from Dr. Dierdre Forth. I advised her that I would let him know.   Will route to Dr. Francine Graven as a FYI.

## 2021-12-13 NOTE — Addendum Note (Signed)
Addended by: Henriette Combs on: 12/13/2021 08:05 AM   Modules accepted: Orders

## 2021-12-15 MED ORDER — METHOTREXATE 2.5 MG PO TABS: 15.0000 mg | ORAL_TABLET | ORAL | 2 refills | Status: DC

## 2021-12-15 MED ORDER — FOLIC ACID 1 MG PO TABS: 1.0000 mg | ORAL_TABLET | Freq: Every day | ORAL | 3 refills | Status: DC

## 2021-12-17 NOTE — Telephone Encounter (Signed)
Thanks for update.  JD

## 2021-12-19 ENCOUNTER — Telehealth: Payer: Self-pay | Admitting: Internal Medicine

## 2021-12-19 NOTE — Telephone Encounter (Signed)
Patient called the office stating she saw that her labs posted in MyChart and would like to speak with Dr. Dimple Casey about starting that new medication that was discussed. Patient states she would like to start as soon as possible.

## 2021-12-19 NOTE — Telephone Encounter (Signed)
Patient advised labs were all fine as expected negative for hepatitis. Dr. Dimple Casey sent a prescription for the methotrexate a few days ago 6 tablets by mouth once weekly and take the folic acid 1mg  daily. She just needs to stop taking the azathioprine, does not need to wait multiple days in between since they methotrexate can take a little while to start working.

## 2021-12-19 NOTE — Telephone Encounter (Signed)
Labs were all fine as expected negative for hepatitis. I sent a prescription for the methotrexate a few days ago 6 tablets by mouth once weekly and take the folic acid 1mg  daily. She just needs to stop taking the azathioprine, does not need to wait multiple days in between since they methotrexate can take a little while to start working.

## 2021-12-23 NOTE — Progress Notes (Unsigned)
Office Visit Note  Patient: Rebecca Short             Date of Birth: 08-06-1940           MRN: [id number]             PCP: Daisy Floro, MD Referring: Daisy Floro, MD Visit Date: 12/30/2021   Subjective:  Follow-up (Fatigue, experiencing side effects of medicine. )   History of Present Illness: Rebecca Short is a 81 y.o. 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 She was hospitalized last month with recurrent pleural effusion 7/4-7/7 requiring repeat thoracocentesis. Since discharge she is back onto higher dose prednisone and symptoms are doing okay. We stopped her azathioprine due to apparent treatment failure and she started methotrexate since 7/31 after negative hepatitis screening. She has not seen any problems with GI intolerance. She does feel somewhat unsteady or lightheaded and having to move slowly on account of this.  Previous HPI 09/09/2021 Rebecca Short is a 81 y.o. 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 She is doing well since last visit without major events. She has some mild GI irritation with the azathioprine. She has significant bruising on her hands and forearms. She saw Dr. Francine Graven for follow up with stable pulmonary symptoms and repeat chest xray obtained 3/28 was also stable. She is tapering the prednisone of a rate about 5 mg/day dose per month currently down to 20 mg daily. She is curious about safety going to the dentist for procedures on her medications.     Previous HPI 07/09/21 Rebecca Short is a 81 y.o. female here for follow up for rheumatoid pleural effusions after starting azathioprine 50 mg last month, continuing prednisone 40 mg daily and prophylactic atovaquone. She went for repeat chest xray with Dr. Francine Graven that looked stable 2 weeks ago. So far she denies any major side effects after  starting imuran.   Previous HPI 05/08/21 Rebecca Short is a 81 y.o. female here for evaluation with recurrent pleural effusions and dyspnea with positive RF and ANCA Abs. She was hospitalized in August after progressively worsening dyspnea and hypotension with exudative pleural fluid removed from left lung space followed by pericardial drainage and initial treatment with colchicine. She subsequently underwent additional thoracentesis drainage of effusions later in August and beginning of September. Results showed neutrophil predominant fluid. Prednisone 40 mg daily was added to treatment with stable condition. This continued for a month but tapering the prednisone resulted in more rapid re accumulation of fluid on a dose of about 20 gm daily or less. Imaging demonstrated progressive increase again in left sided effusions and repeat drainage again on 11/14 and 11/30 when down to 10 mg and 5 mg daily doses of prednisone along with colchicine. She is now continuing prednisone 40 mg with atovaquone prophylaxis and colchicine 0.6 mg daily. In addition to pulmonary symptoms she reports bilateral shoulder pain and stiffness developing around the initial onset of her dyspnea before hospitalization in August.  This improved completely on 40 mg prednisone but she noticed definite recurrence of shoulder pain symptoms when tapering down the prednisone to 10 or 5 mg/day dose.  No active complaints today since she is back on higher dose medication.  She denies any peripheral joint pain involvement did not notice any significant swelling or numbness in her  upper extremities associated with the pain.   Review of Systems  Constitutional:  Positive for fatigue.  HENT:  Negative for mouth sores and mouth dryness.   Eyes:  Positive for dryness.  Respiratory:  Positive for shortness of breath.   Cardiovascular:  Negative for chest pain and palpitations.  Gastrointestinal:  Negative for blood in stool, constipation and  diarrhea.  Endocrine: Positive for increased urination.  Genitourinary:  Positive for involuntary urination.  Musculoskeletal:  Positive for muscle weakness. Negative for joint pain, joint pain, joint swelling, myalgias, morning stiffness, muscle tenderness and myalgias.  Skin:  Positive for color change, hair loss and sensitivity to sunlight. Negative for rash.  Allergic/Immunologic: Positive for susceptible to infections.  Neurological:  Negative for dizziness and headaches.  Hematological:  Negative for swollen glands.  Psychiatric/Behavioral:  Negative for depressed mood and sleep disturbance. The patient is nervous/anxious.     PMFS History:  Patient Active Problem List   Diagnosis Date Noted   Pleural effusion 11/26/2021   Serositis (HCC) 09/27/2021   Fatigue 09/27/2021   Macular pucker, left eye 06/18/2021   High risk medication use 05/30/2021   Bilateral shoulder pain 05/08/2021   Vitamin D deficiency 05/08/2021   Abnormal ANCA test 05/08/2021   RA (rheumatoid arthritis) (HCC) 04/22/2021   S/P thoracentesis    Arterial hypotension    Pleural effusion on left    Pericardial effusion    Leukocytosis 01/03/2021   Community acquired pneumonia 01/03/2021   Atrial fibrillation (HCC) 12/24/2020   Atrial fibrillation with RVR (HCC) 12/23/2020   Musculoskeletal pain 12/23/2020   Posterior vitreous detachment of both eyes 04/30/2020   Macular pucker, right eye 11/08/2019   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HCC) 11/08/2019   Intermediate stage nonexudative age-related macular degeneration of both eyes 11/08/2019   Anticoagulated 11/18/2016   Bradycardia 03/23/2014   OSA (obstructive sleep apnea) 01/19/2014   Obesity (BMI 30-39.9) 01/19/2014   Paroxysmal A-fib (HCC) 10/04/2013   Nonspecific abnormal unspecified cardiovascular function study 07/21/2013   Acute gastroenteritis 07/15/2013    Past Medical History:   Diagnosis Date   A-fib [facility])    Allergy    Atrial fibrillation (HCC)    FH: cholecystectomy 1997   Macular degeneration    OSA (obstructive sleep apnea)    mild with total AHI 7.25/hr and severe during REM sleep at 35/hr now on CPAP at 6cm H2O    Family History  Problem Relation Age of Onset   Osteoporosis Mother    Macular degeneration Mother    Heart Problems Father    Stroke Father    Diabetes Brother    Kidney failure Brother    Cancer Maternal Grandmother    Dementia Paternal Grandmother    Suicidality Other    Heart attack Neg Hx    Past Surgical History:  Procedure Laterality Date   CHEST TUBE INSERTION Left 11/27/2021   Procedure: CHEST TUBE INSERTION;  Surgeon: Martina Sinner, MD;  Location: [facility] ENDOSCOPY;  Service: Pulmonary;  Laterality: Left;  Prefer AM slot, thanks   CHOLECYSTECTOMY     PERICARDIOCENTESIS N/A 01/07/2021   Procedure: PERICARDIOCENTESIS;  Surgeon: Yvonne Kendall, MD;  Location: [facility] INVASIVE CV LAB;  Service: Cardiovascular;  Laterality: N/A;   REPLACEMENT TOTAL KNEE BILATERAL     THORACENTESIS N/A 01/25/2021   Procedure: THORACENTESIS;  Surgeon: Martina Sinner, MD;  Location: [facility] ENDOSCOPY;  Service: Pulmonary;  Laterality: N/A;   THORACENTESIS N/A 04/08/2021   Procedure:  THORACENTESIS;  Surgeon: Freddi Starr, MD;  Location[facility] ENDOSCOPY;  Service: Pulmonary;  Laterality: N/A;   THORACENTESIS N/A 04/24/2021   Procedure: THORACENTESIS;  Surgeon: Juanito Doom, MD;  Location: [place];  Service: Cardiopulmonary;  Laterality: N/A;   Social History   Social History Narrative   ** Merged History Encounter **       Immunization History  Administered Date(s) Administered   Influenza-Unspecified 01/24/2014   PFIZER(Purple Top)SARS-COV-2 Vaccination 06/16/2019, 07/07/2019     Objective: Vital Signs: BP 122/82 (BP Location: Left Arm, Patient Position: Sitting, Cuff Size: Normal)   Pulse (!) 118   Resp 14   Ht 5\' 2"  (1.575 m)    Wt 161 lb 9.6 oz (73.3 kg)   BMI 29.56 kg/m    Physical Exam Cardiovascular:     Rate and Rhythm: Regular rhythm. Tachycardia present.  Pulmonary:     Effort: Pulmonary effort is normal.     Breath sounds: Normal breath sounds.  Skin:    General: Skin is warm and dry.     Findings: Bruising present.     Comments: Extensive bruising throughout both arms 1+ pedal edema  Neurological:     Mental Status: She is alert.  Psychiatric:        Mood and Affect: Mood normal.      Musculoskeletal Exam:  Elbows full ROM no tenderness or swelling Wrists full ROM no tenderness or swelling Fingers full ROM no tenderness or swelling Knees full ROM no tenderness or swelling  Investigation: No additional findings.  Imaging: DG Chest 2 View  Result Date: 12/06/2021 CLINICAL DATA:  Follow-up pleural effusion EXAM: CHEST - 2 VIEW COMPARISON:  Chest x-ray dated November 25, 2021 FINDINGS: Cardiac and mediastinal contours are unchanged and within normal limits. Small left pleural effusion, decreased in size when compared with prior exam. Similar trace right pleural effusion. Bibasilar atelectasis. Lungs otherwise clear. No evidence of pneumothorax. IMPRESSION: Small left pleural effusion, decreased in size when compared with prior exam. Similar trace right pleural effusion. Electronically Signed   By: Yetta Glassman M.D.   On: 12/06/2021 09:45    Recent Labs: Lab Results  Component Value Date   WBC 7.4 11/29/2021   HGB 12.1 11/29/2021   PLT 290 11/29/2021   NA 138 11/29/2021   K 3.5 11/29/2021   CL 106 11/29/2021   CO2 27 11/29/2021   GLUCOSE 178 (H) 11/29/2021   BUN 15 11/29/2021   CREATININE 0.73 11/29/2021   BILITOT 0.5 11/26/2021   ALKPHOS 43 11/26/2021   AST 17 11/26/2021   ALT 15 11/26/2021   PROT 5.1 (L) 11/27/2021   ALBUMIN 2.7 (L) 11/26/2021   CALCIUM 8.2 (L) 11/29/2021   GFRAA 90 12/27/2018   QFTBGOLDPLUS Negative 11/28/2021    Speciality Comments: No specialty comments  available.  Procedures:  No procedures performed Allergies: Erythromycin, Sulfa antibiotics, Azithromycin, Condrolite [glucosamine-chondroitin-msm], Glucosamine, and Sulfamethoxazole-trimethoprim   Assessment / Plan:     Visit Diagnoses: Rheumatoid arthritis involving multiple sites with positive rheumatoid factor (HCC)  Pleural effusion on left  High risk medication use - azathioprine 100 mg, methotrexate 6 tablets once weekly, folic acid 1 mg daily.  ***  Orders: No orders of the defined types were placed in this encounter.  No orders of the defined types were placed in this encounter.    Follow-Up Instructions: No follow-ups on file.   Collier Salina, MD  Note - This record has been created using [employer].  Chart creation  errors have been sought, but may not always  have been located. Such creation errors do not reflect on  the standard of medical care.

## 2021-12-30 ENCOUNTER — Encounter: Payer: Self-pay | Admitting: Internal Medicine

## 2021-12-30 ENCOUNTER — Ambulatory Visit: Payer: Medicare Other | Attending: Internal Medicine | Admitting: Internal Medicine

## 2021-12-30 VITALS — BP 122/82 | HR 118 | Resp 14 | Ht 62.0 in | Wt 161.6 lb

## 2021-12-30 DIAGNOSIS — M0579 Rheumatoid arthritis with rheumatoid factor of multiple sites without organ or systems involvement: Secondary | ICD-10-CM | POA: Diagnosis present

## 2021-12-30 DIAGNOSIS — Z79899 Other long term (current) drug therapy: Secondary | ICD-10-CM

## 2021-12-30 DIAGNOSIS — J90 Pleural effusion, not elsewhere classified: Secondary | ICD-10-CM

## 2021-12-31 LAB — COMPLETE METABOLIC PANEL WITH GFR
AG Ratio: 2 (calc) (ref 1.0–2.5)
ALT: 17 U/L (ref 6–29)
AST: 14 U/L (ref 10–35)
Albumin: 3.8 g/dL (ref 3.6–5.1)
Alkaline phosphatase (APISO): 51 U/L (ref 37–153)
BUN: 16 mg/dL (ref 7–25)
CO2: 31 mmol/L (ref 20–32)
Calcium: 9.5 mg/dL (ref 8.6–10.4)
Chloride: 99 mmol/L (ref 98–110)
Creat: 0.86 mg/dL (ref 0.60–0.95)
Globulin: 1.9 g/dL (calc) (ref 1.9–3.7)
Glucose, Bld: 108 mg/dL — ABNORMAL HIGH (ref 65–99)
Potassium: 4.9 mmol/L (ref 3.5–5.3)
Sodium: 135 mmol/L (ref 135–146)
Total Bilirubin: 0.6 mg/dL (ref 0.2–1.2)
Total Protein: 5.7 g/dL — ABNORMAL LOW (ref 6.1–8.1)
eGFR: 68 mL/min/{1.73_m2} (ref >=60)

## 2021-12-31 LAB — CBC WITH DIFFERENTIAL/PLATELET
Absolute Monocytes: 222 cells/uL (ref 200–950)
Basophils Absolute: 18 cells/uL (ref 0–200)
Basophils Relative: 0.3 %
Eosinophils Absolute: 12 cells/uL — ABNORMAL LOW (ref 15–500)
Eosinophils Relative: 0.2 %
HCT: 42.3 % (ref 35.0–45.0)
Hemoglobin: 14.3 g/dL (ref 11.7–15.5)
Lymphs Abs: 408 cells/uL — ABNORMAL LOW (ref 850–3900)
MCH: 33 pg (ref 27.0–33.0)
MCHC: 33.8 g/dL (ref 32.0–36.0)
MCV: 97.7 fL (ref 80.0–100.0)
MPV: 11.5 fL (ref 7.5–12.5)
Monocytes Relative: 3.7 %
Neutro Abs: 5340 cells/uL (ref 1500–7800)
Neutrophils Relative %: 89 %
Platelets: 217 10*3/uL (ref 140–400)
RBC: 4.33 10*6/uL (ref 3.80–5.10)
RDW: 14.2 % (ref 11.0–15.0)
Total Lymphocyte: 6.8 %
WBC: 6 10*3/uL (ref 3.8–10.8)

## 2021-12-31 LAB — SEDIMENTATION RATE: Sed Rate: 6 mm/h (ref 0–30)

## 2021-12-31 NOTE — Progress Notes (Signed)
Labs look okay for continuing the methotrexate as planned for now. Her sedimentation rate is normal improved since a month ago, probably due to the prednisone at this time.

## 2022-01-01 ENCOUNTER — Other Ambulatory Visit: Payer: Self-pay | Admitting: Thoracic Surgery (Cardiothoracic Vascular Surgery)

## 2022-01-01 DIAGNOSIS — J90 Pleural effusion, not elsewhere classified: Secondary | ICD-10-CM

## 2022-01-02 NOTE — Progress Notes (Signed)
[address]       [place] [address]             [id number]                    Rebecca Short [facility] Medical Record #[id number] Date of Birth: April 10, 1941  Referring: Martina Sinner, MD Primary Care: Daisy Floro, MD Primary Cardiologist: [name], MD  Chief Complaint:    Chief Complaint  Patient presents with   Pleural Effusion    Surgical consult CXR 12/06/21, Chest CT 11/28/21    History of Present Illness:    Rebecca Short 81 y.o. female presents to discuss management of a recurrent left pleural effusion.  She has undergone thoracentesis on multiple occasions, and was recently admit for placement of a pigtail catheter.  Since the drain was removed, she has not had any significant respiratory issues.         Past Medical History:  Diagnosis Date   A-fib [facility])    Allergy    Atrial fibrillation (HCC)    FH: cholecystectomy 1997   Macular degeneration    OSA (obstructive sleep apnea)    mild with total AHI 7.25/hr and severe during REM sleep at 35/hr now on CPAP at 6cm H2O    Past Surgical History:  Procedure Laterality Date   CHEST TUBE INSERTION Left 11/27/2021   Procedure: CHEST TUBE INSERTION;  Surgeon: Martina Sinner, MD;  Location: [facility] ENDOSCOPY;  Service: Pulmonary;  Laterality: Left;  Prefer AM slot, thanks   CHOLECYSTECTOMY     PERICARDIOCENTESIS N/A 01/07/2021   Procedure: PERICARDIOCENTESIS;  Surgeon: Yvonne Kendall, MD;  Location: [facility] INVASIVE CV LAB;  Service: Cardiovascular;  Laterality: N/A;   REPLACEMENT TOTAL KNEE BILATERAL     THORACENTESIS N/A 01/25/2021   Procedure: THORACENTESIS;  Surgeon: Martina Sinner, MD;  Location: [facility] ENDOSCOPY;  Service: Pulmonary;  Laterality: N/A;   THORACENTESIS N/A 04/08/2021   Procedure: THORACENTESIS;  Surgeon: Martina Sinner, MD;  Location: [facility] ENDOSCOPY;  Service: Pulmonary;  Laterality: N/A;   THORACENTESIS N/A 04/24/2021   Procedure: THORACENTESIS;  Surgeon:  Lupita Leash, MD;  Location: [facility] ENDOSCOPY;  Service: Cardiopulmonary;  Laterality: N/A;    Family History  Problem Relation Age of Onset   Osteoporosis Mother    Macular degeneration Mother    Heart Problems Father    Stroke Father    Diabetes Brother    Kidney failure Brother    Cancer Maternal Grandmother    Dementia Paternal Grandmother    Suicidality Other    Heart attack Neg Hx      Social History   Tobacco Use  Smoking Status Never  Smokeless Tobacco Never    Social History   Substance and Sexual Activity  Alcohol Use Never     Allergies  Allergen Reactions   Erythromycin Other (See Comments)    Other reaction(s): burning   Sulfa Antibiotics     Other reaction(s): Unknown   Azithromycin Other (See Comments)    Pain in arm with IV infusion    Condrolite [Glucosamine-Chondroitin-Msm] Rash   Glucosamine Rash    Other reaction(s): rash Other reaction(s): rash   Sulfamethoxazole-Trimethoprim Rash    Other reaction(s): rash Other reaction(s): rash    Current Outpatient Medications  Medication Sig Dispense Refill   apixaban (ELIQUIS) 5 MG TABS tablet TAKE 1 TABLET BY MOUTH TWICE A DAY (Patient taking differently: Take 5 mg by  mouth 2 (two) times daily.) 60 tablet 6   CALCIUM CITRATE-VITAMIN D PO Take 1 tablet by mouth at bedtime.     carboxymethylcellulose (REFRESH PLUS) 0.5 % SOLN Place 1 drop into both eyes 2 (two) times daily.     EPIPEN 2-PAK 0.3 MG/0.3ML SOAJ injection Inject 0.3 mg into the muscle once as needed for anaphylaxis (severe allergic reaction).  0   folic acid (FOLVITE) 1 MG tablet Take 1 tablet (1 mg total) by mouth daily. 90 tablet 3   furosemide (LASIX) 20 MG tablet Take 1 tablet (20 mg total) by mouth daily. Please call [phone] to schedule an appointment for future refills. Thank you. 1st attempt. 30 tablet 0   methotrexate (RHEUMATREX) 2.5 MG tablet Take 6 tablets (15 mg total) by mouth once a week. Caution:Chemotherapy. Protect  from light. 24 tablet 2   metoprolol tartrate (LOPRESSOR) 25 MG tablet Take 1 tablet (25 mg total) by mouth 2 (two) times daily. May increase to twice daily as tolerated. 60 tablet 1   Multiple Vitamin (MULTIVITAMIN WITH MINERALS) TABS tablet Take 1 tablet by mouth every morning.     Multiple Vitamins-Minerals (PRESERVISION AREDS PO) Take 1 capsule by mouth 2 (two) times daily.     pantoprazole (PROTONIX) 40 MG tablet TAKE 1 TABLET BY MOUTH DAILY AT 12 NOON. (Patient taking differently: Take 40 mg by mouth daily at 12 noon.) 90 tablet 3   potassium chloride (KLOR-CON) 20 MEQ packet USE 1 PACKET ONCE DAILY (Patient taking differently: Take 20 mEq by mouth daily.) 90 packet 2   predniSONE (DELTASONE) 10 MG tablet Take 2 tablets (20 mg total) by mouth daily with breakfast. (Patient taking differently: Take 15 mg by mouth daily with breakfast.) 60 tablet 3   PRESCRIPTION MEDICATION every 14 (fourteen) days. Allergy shots administered by Dr. Vaughn Callas     atovaquone [facility]) 750 MG/5ML suspension Take 5 mLs (750 mg total) by mouth 2 (two) times daily with a meal. 300 mL 2   azaTHIOprine (IMURAN) 50 MG tablet TAKE 2 TABLETS BY MOUTH EVERY DAY 180 tablet 1   No current facility-administered medications for this visit.    Review of Systems  Constitutional:  Positive for malaise/fatigue. Negative for weight loss.  Respiratory:  Positive for shortness of breath.   Cardiovascular:  Negative for chest pain.     PHYSICAL EXAMINATION: BP 128/85   Pulse 94   Resp 20 Comment: RA  Ht 5\' 2"  (1.575 m)   Wt 163 lb (73.9 kg)   SpO2 97%   BMI 29.81 kg/m  Physical Exam Constitutional:      General: She is not in acute distress.    Appearance: She is normal weight. She is not ill-appearing.  HENT:     Head: Normocephalic and atraumatic.  Eyes:     Extraocular Movements: Extraocular movements intact.  Cardiovascular:     Rate and Rhythm: Normal rate.  Pulmonary:     Effort: Pulmonary effort is normal.  No respiratory distress.  Abdominal:     General: Abdomen is flat. There is no distension.  Skin:    General: Skin is warm and dry.  Neurological:     General: No focal deficit present.     Mental Status: She is alert and oriented to person, place, and time.     Diagnostic Studies & Laboratory data:     Recent Radiology Findings:   DG Chest 2 View  Result Date: 01/03/2022 CLINICAL DATA:  Pleural effusion EXAM: CHEST -  2 VIEW COMPARISON:  Radiograph 12/06/2021 FINDINGS: There is a persistent small left pleural effusion with adjacent left basilar opacity. No right pleural effusion. No evidence of pneumothorax. Bones are unchanged. IMPRESSION: Stable small left pleural effusion with adjacent basilar opacity, likely atelectasis. Electronically Signed   By: Caprice Renshaw M.D.   On: 01/03/2022 13:38       I have independently reviewed the above radiology studies  and reviewed the findings with the patient.   Recent Lab Findings: Lab Results  Component Value Date   WBC 6.0 12/30/2021   HGB 14.3 12/30/2021   HCT 42.3 12/30/2021   PLT 217 12/30/2021   GLUCOSE 108 (H) 12/30/2021   CHOL 145 08/19/2017   TRIG 101 08/19/2017   HDL 47 08/19/2017   LDLCALC 78 08/19/2017   ALT 17 12/30/2021   AST 14 12/30/2021   NA 135 12/30/2021   K 4.9 12/30/2021   CL 99 12/30/2021   CREATININE 0.86 12/30/2021   BUN 16 12/30/2021   CO2 31 12/30/2021   TSH 2.104 01/25/2021   INR 1.1 11/26/2021   HGBA1C 5.4 12/23/2020     Assessment / Plan:   81 yo female with hx of rheumatoid arthritis, and hx of bilateral pleural effusions.  She is on eliquis for atrial fibrillation. She has also had a pericardiocentesis.  There is a question of malignancy, however this has been an ongoing issue for over 6 months, and there is no evidence of nodular disease on scan.  I have ordered a PET/CT as a next step, but think that this will be low yield.  She also stated that if this is a cancer, she likely would not seek any  further treatment.  That being said, the need for a pleural biopsy remains uncertain.  I will follow-up with her after the scan.     I  spent 40 minutes with  the patient face to face in counseling and coordination of care.    Corliss Skains 01/06/2022 12:01 PM

## 2022-01-03 ENCOUNTER — Institutional Professional Consult (permissible substitution) (INDEPENDENT_AMBULATORY_CARE_PROVIDER_SITE_OTHER): Payer: Medicare Other | Admitting: Thoracic Surgery (Cardiothoracic Vascular Surgery)

## 2022-01-03 ENCOUNTER — Other Ambulatory Visit: Payer: Self-pay | Admitting: Thoracic Surgery (Cardiothoracic Vascular Surgery)

## 2022-01-03 ENCOUNTER — Ambulatory Visit
Admission: RE | Admit: 2022-01-03 | Discharge: 2022-01-03 | Disposition: A | Discharge status: Home / Self Care | Payer: Medicare Other | Source: Ambulatory Visit | Attending: Thoracic Surgery (Cardiothoracic Vascular Surgery) | Admitting: Thoracic Surgery (Cardiothoracic Vascular Surgery)

## 2022-01-03 VITALS — BP 128/85 | HR 94 | Resp 20 | Ht 62.0 in | Wt 163.0 lb

## 2022-01-03 DIAGNOSIS — R918 Other nonspecific abnormal finding of lung field: Secondary | ICD-10-CM

## 2022-01-03 DIAGNOSIS — J90 Pleural effusion, not elsewhere classified: Secondary | ICD-10-CM | POA: Diagnosis not present

## 2022-01-08 ENCOUNTER — Telehealth: Payer: Self-pay | Admitting: Pulmonary Disease

## 2022-01-08 NOTE — Telephone Encounter (Signed)
Called and left voicemail for patient to call office back.   Does she have a certain rheumatologist that she would like her second opinion?  Or is any doctor ok.

## 2022-01-08 NOTE — Telephone Encounter (Signed)
Called patient back and she states that Dr Heide Guile offices is going to be requesting records form our office from all the lab work and tests that Dr Francine Graven has ordered. This is the Dr for her second opinion. She states she has filled out a release form and talked this over with Cherina in the past and Dr Francine Graven is aware. Nothing further needed

## 2022-01-09 ENCOUNTER — Other Ambulatory Visit: Payer: Self-pay | Admitting: Internal Medicine

## 2022-01-09 NOTE — Telephone Encounter (Signed)
Next Visit: 03/03/2022  Last Visit: 12/30/2021  Last Fill: 12/15/2021  DX: Rheumatoid arthritis involving multiple sites with positive rheumatoid factor (HCC)  Current Dose per office note 12/30/2021: methotrexate 2.5 mg 6 tablets once weekly  Labs: 12/30/2021  CMP WNL except Glucose, Bld 108 HIGH, Total Protein 5.7 LOW CBC WNL except Lymphs Abs 408 LOW, Eosinophils Absolute 12 LOW  Okay to refill Methotrexate?

## 2022-01-10 LAB — FUNGUS CULTURE WITH STAIN

## 2022-01-10 LAB — FUNGAL ORGANISM REFLEX

## 2022-01-10 LAB — FUNGUS CULTURE RESULT

## 2022-01-13 NOTE — Progress Notes (Unsigned)
Cardiology Office Note   Date:  01/14/2022   ID:  Rebecca, Short September 12, 1940, MRN [id number]  PCP:  Daisy Floro, MD    No chief complaint on file.  AFib  Wt Readings from Last 3 Encounters:  01/14/22 164 lb (74.4 kg)  01/03/22 163 lb (73.9 kg)  12/30/21 161 lb 9.6 oz (73.3 kg)       History of Present Illness: Rebecca Short is a 81 y.o. female    with a h/o PAF.  She has chronic back pain which has limited her exercise.  In the past, Managing hydration and stress have helped minimize her palpitations.     In 3/19, it was noted: "She is spending more time in atrial fibrillation now.  It will likely be more difficult to get her out of atrial fibrillation.  Given her lack of symptoms, would not pursue antiarrhythmic drug or cardioversion at this time.   She has been treated for sleep apnea with CPAP.   She went to [facility] [place] in 2019. She was at higher elevation and she did well.  She did not ned any extra metoprolol.   She had her COVID vaccines.  THe day After the second pfizer shot, she felt some irregularity for 30 minutes and it resolved.     She got her booster and flu shot on the same day.  Had some brief palpitations but this resolved.     In 12/2020, she had pneumonia complicated by pleural effusion and pericardial effusion.  She had thoracentesis and pericardiocentesis and AFib rates were better controlled.  She was readmittted with shortness of breath and had bilateral thoracentesis.    Rate control meds were limited by low blood pressure.   She is keeping her BP and HR records diligently. She avoids crowds since she is on prednisone.   She wa sin the hospital in 11/2021 again with the pleural effusion.  Records show: "presented 7/4 for worsening shortness of breath and found to have enlarging left pleural effusion and small right pleural effusion.  She underwent chest tube placement 7/5 with removal of approximately 800 mL.  IV Lasix  yesterday morning leading to drop in blood pressure to 41/31.  She was also on metoprolol tartrate 25 mg twice daily>> now reduced to 12.5 mg twice daily.  She got total of 1 L of normal saline bolus with improved blood pressure."  Due to recurrent pleural effusion, meds for rheumatologic issues have been adjusted.    SHe is having PET scan to r/o malignancy.  She was having side effects from Imuran.  Changed to MTX.    No bleeding from the ELiquis.       Past Medical History:  Diagnosis Date   A-fib [facility])    Allergy    Atrial fibrillation (HCC)    FH: cholecystectomy 1997   Macular degeneration    OSA (obstructive sleep apnea)    mild with total AHI 7.25/hr and severe during REM sleep at 35/hr now on CPAP at 6cm H2O    Past Surgical History:  Procedure Laterality Date   CHEST TUBE INSERTION Left 11/27/2021   Procedure: CHEST TUBE INSERTION;  Surgeon: Martina Sinner, MD;  Location: [facility] ENDOSCOPY;  Service: Pulmonary;  Laterality: Left;  Prefer AM slot, thanks   CHOLECYSTECTOMY     PERICARDIOCENTESIS N/A 01/07/2021   Procedure: PERICARDIOCENTESIS;  Surgeon: Yvonne Kendall, MD;  Location: [facility] INVASIVE CV LAB;  Service: Cardiovascular;  Laterality: N/A;  REPLACEMENT TOTAL KNEE BILATERAL     THORACENTESIS N/A 01/25/2021   Procedure: THORACENTESIS;  Surgeon: Martina Sinner, MD;  Location: [facility] ENDOSCOPY;  Service: Pulmonary;  Laterality: N/A;   THORACENTESIS N/A 04/08/2021   Procedure: THORACENTESIS;  Surgeon: Martina Sinner, MD;  Location: [facility] ENDOSCOPY;  Service: Pulmonary;  Laterality: N/A;   THORACENTESIS N/A 04/24/2021   Procedure: THORACENTESIS;  Surgeon: Lupita Leash, MD;  Location: [facility] ENDOSCOPY;  Service: Cardiopulmonary;  Laterality: N/A;     Current Outpatient Medications  Medication Sig Dispense Refill   apixaban (ELIQUIS) 5 MG TABS tablet TAKE 1 TABLET BY MOUTH TWICE A DAY 60 tablet 6   CALCIUM CITRATE-VITAMIN D PO Take 1 tablet by mouth at bedtime.      carboxymethylcellulose (REFRESH PLUS) 0.5 % SOLN Place 1 drop into both eyes 2 (two) times daily.     EPIPEN 2-PAK 0.3 MG/0.3ML SOAJ injection Inject 0.3 mg into the muscle once as needed for anaphylaxis (severe allergic reaction).  0   folic acid (FOLVITE) 1 MG tablet Take 1 tablet (1 mg total) by mouth daily. 90 tablet 3   furosemide (LASIX) 20 MG tablet Take 1 tablet (20 mg total) by mouth daily. Please call [phone] to schedule an appointment for future refills. Thank you. 1st attempt. 30 tablet 0   methotrexate (RHEUMATREX) 2.5 MG tablet TAKE 6 TABLETS (15 MG TOTAL) BY MOUTH ONCE A WEEK. CAUTION:CHEMOTHERAPY. PROTECT FROM LIGHT. 78 tablet 0   metoprolol tartrate (LOPRESSOR) 25 MG tablet Take 1 tablet (25 mg total) by mouth 2 (two) times daily. May increase to twice daily as tolerated. 60 tablet 1   Multiple Vitamin (MULTIVITAMIN WITH MINERALS) TABS tablet Take 1 tablet by mouth every morning.     Multiple Vitamins-Minerals (PRESERVISION AREDS PO) Take 1 capsule by mouth 2 (two) times daily.     pantoprazole (PROTONIX) 40 MG tablet TAKE 1 TABLET BY MOUTH DAILY AT 12 NOON. 90 tablet 3   potassium chloride (KLOR-CON) 20 MEQ packet USE 1 PACKET ONCE DAILY 90 packet 2   predniSONE (DELTASONE) 10 MG tablet Take 2 tablets (20 mg total) by mouth daily with breakfast. (Patient taking differently: Take 15 mg by mouth daily with breakfast.) 60 tablet 3   PRESCRIPTION MEDICATION every 14 (fourteen) days. Allergy shots administered by Dr. [place] Callas     No current facility-administered medications for this visit.    Allergies:   Erythromycin, Sulfa antibiotics, Azithromycin, Condrolite [glucosamine-chondroitin-msm], Glucosamine, and Sulfamethoxazole-trimethoprim    Social History:  The patient  reports that she has never smoked. She has never used smokeless tobacco. She reports that she does not drink alcohol and does not use drugs.   Family History:  The patient's family history includes Cancer in her  maternal grandmother; Dementia in her paternal grandmother; Diabetes in her brother; Heart Problems in her father; Kidney failure in her brother; Macular degeneration in her mother; Osteoporosis in her mother; Stroke in her father; Suicidality in an other family member.    ROS:  Please see the history of present illness.   Otherwise, review of systems are positive for concern over side effects of immunosuppression.   All other systems are reviewed and negative.    PHYSICAL EXAM: VS:  BP 110/70 (BP Location: Left Arm, Patient Position: Sitting, Cuff Size: Normal)   Pulse (!) 105   Ht 5\' 2"  (1.575 m)   Wt 164 lb (74.4 kg)   SpO2 96%   BMI 30.00 kg/m  , BMI Body mass index  is 30 kg/m. GEN: Well nourished, well developed, in no acute distress HEENT: normal Neck: no JVD, carotid bruits, or masses Cardiac: Irregularly irregular; no murmurs, rubs, or gallops,no edema  Respiratory:  clear to auscultation bilaterally, normal work of breathing GI: soft, nontender, nondistended, + BS MS: no deformity or atrophy Skin: warm and dry, no rash Neuro:  Strength and sensation are intact Psych: euthymic mood, full affect   EKG:   The ekg ordered 11/26/21 demonstrates AFib with RVR, low voltage   Recent Labs: 01/25/2021: TSH 2.104 11/26/2021: B Natriuretic Peptide 235.7; Magnesium 2.2 12/30/2021: ALT 17; BUN 16; Creat 0.86; Hemoglobin 14.3; Platelets 217; Potassium 4.9; Sodium 135   Lipid Panel    Component Value Date/Time   CHOL 145 08/19/2017 0816   TRIG 101 08/19/2017 0816   HDL 47 08/19/2017 0816   CHOLHDL 3.1 08/19/2017 0816   LDLCALC 78 08/19/2017 0816     Other studies Reviewed: Additional studies/ records that were reviewed today with results demonstrating: labs reviewed- hospital records reviewed.  Normal hemoglobin and creatinine in August 2023   ASSESSMENT AND PLAN:  AFib: Metoprolol changed to 12.5 mg BID due to some hypotension.  She keeps track of her blood pressure and heart  rate at home.  Now back to 25 mg BID.  Eliquis for stroke prevention. Pericarditis: Prior pericardiocentesis.  No significant pericardial effusion on most recent echo. Anticoagulated: Eliquis for stroke prevention.  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 Pleural effusion: Status postdrainage with chest tube.  Follow-up with pulmonary. OSA: Using CPAP.  Intentional weight loss. 20 lbs in the past 6 months.    Current medicines are reviewed at length with the patient today.  The patient concerns regarding her medicines were addressed.  The following changes have been made:  No change  Labs/ tests ordered today include:  No orders of the defined types were placed in this encounter.   Recommend 150 minutes/week of aerobic exercise Low fat, low carb, high fiber diet recommended  Disposition:   FU in 6 months   Signed, [name], MD  01/14/2022 4:51 PM    [facility] [address] [place], [place], [place]  [address] Phone: [phone]; Fax: [phone]

## 2022-01-14 ENCOUNTER — Ambulatory Visit (INDEPENDENT_AMBULATORY_CARE_PROVIDER_SITE_OTHER): Payer: Medicare Other | Admitting: Interventional Cardiology

## 2022-01-14 ENCOUNTER — Encounter: Payer: Self-pay | Admitting: Interventional Cardiology

## 2022-01-14 VITALS — BP 110/70 | HR 105 | Ht 62.0 in | Wt 164.0 lb

## 2022-01-14 DIAGNOSIS — G4733 Obstructive sleep apnea (adult) (pediatric): Secondary | ICD-10-CM

## 2022-01-14 DIAGNOSIS — D6869 Other thrombophilia: Secondary | ICD-10-CM

## 2022-01-14 DIAGNOSIS — I4821 Permanent atrial fibrillation: Secondary | ICD-10-CM | POA: Diagnosis not present

## 2022-01-14 DIAGNOSIS — E669 Obesity, unspecified: Secondary | ICD-10-CM

## 2022-01-14 DIAGNOSIS — Z7901 Long term (current) use of anticoagulants: Secondary | ICD-10-CM | POA: Diagnosis not present

## 2022-01-14 NOTE — Patient Instructions (Addendum)
Medication Instructions:  Your physician recommends that you continue on your current medications as directed. Please refer to the Current Medication list given to you today.  *If you need a refill on your cardiac medications before your next appointment, please call your pharmacy*   Lab Work: none If you have labs (blood work) drawn today and your tests are completely normal, you will receive your results only by: MyChart Message (if you have MyChart) OR A paper copy in the mail If you have any lab test that is abnormal or we need to change your treatment, we will call you to review the results.   Testing/Procedures: none   Follow-Up: At [facility], you and your health needs are our priority.  As part of our continuing mission to provide you with exceptional heart care, we have created designated Provider Care Teams.  These Care Teams include your primary Cardiologist (physician) and Advanced Practice Providers (APPs -  Physician Assistants and Nurse Practitioners) who all work together to provide you with the care you need, when you need it.  We recommend signing up for the patient portal called "MyChart".  Sign up information is provided on this After Visit Summary.  MyChart is used to connect with patients for Virtual Visits (Telemedicine).  Patients are able to view lab/test results, encounter notes, upcoming appointments, etc.  Non-urgent messages can be sent to your provider as well.   To learn more about what you can do with MyChart, go to [email or web address].    Your next appointment:   July 17, 2022 at 10:20  The format for your next appointment:   In Person  Provider:   [name], MD     Other Instructions    Important Information About Sugar

## 2022-01-16 ENCOUNTER — Encounter (HOSPITAL_COMMUNITY)
Admission: RE | Admit: 2022-01-16 | Discharge: 2022-01-16 | Disposition: A | Discharge status: Home / Self Care | Payer: Medicare Other | Source: Ambulatory Visit | Attending: Thoracic Surgery (Cardiothoracic Vascular Surgery) | Admitting: Thoracic Surgery (Cardiothoracic Vascular Surgery)

## 2022-01-16 ENCOUNTER — Telehealth: Payer: Self-pay | Admitting: Internal Medicine

## 2022-01-16 DIAGNOSIS — J90 Pleural effusion, not elsewhere classified: Secondary | ICD-10-CM | POA: Insufficient documentation

## 2022-01-16 DIAGNOSIS — R918 Other nonspecific abnormal finding of lung field: Secondary | ICD-10-CM | POA: Diagnosis present

## 2022-01-16 LAB — ACID FAST CULTURE WITH REFLEXED SENSITIVITIES (MYCOBACTERIA): Acid Fast Culture: NEGATIVE

## 2022-01-16 LAB — GLUCOSE, CAPILLARY: Glucose-Capillary: 118 mg/dL — ABNORMAL HIGH (ref 70–99)

## 2022-01-16 MED ORDER — FLUDEOXYGLUCOSE F - 18 (FDG) INJECTION
8.0000 | Freq: Once | INTRAVENOUS | Status: AC
  Administered 2022-01-16: 8.17 via INTRAVENOUS

## 2022-01-16 NOTE — Telephone Encounter (Signed)
Patient left a voicemail stating she had a PET scan today and was told to drink lots of water to "flush everything out". Patient states for the last week she doesn't think she has been urinating enough. Patient requests a call back if Methotrexate can cause fluid retention because she looks like she is gaining weight.

## 2022-01-17 ENCOUNTER — Ambulatory Visit (INDEPENDENT_AMBULATORY_CARE_PROVIDER_SITE_OTHER): Payer: Medicare Other | Admitting: Thoracic Surgery (Cardiothoracic Vascular Surgery)

## 2022-01-17 ENCOUNTER — Telehealth: Payer: Self-pay | Admitting: *Deleted

## 2022-01-17 ENCOUNTER — Telehealth: Payer: Self-pay

## 2022-01-17 DIAGNOSIS — J90 Pleural effusion, not elsewhere classified: Secondary | ICD-10-CM | POA: Diagnosis not present

## 2022-01-17 NOTE — Telephone Encounter (Signed)
Long term use of PPI is not ideal. I would encourage her to try H2 blocker. If it doesn't work then ok to continue pantoprazole.

## 2022-01-17 NOTE — Telephone Encounter (Signed)
Advised patient that Methotrexate does not generally cause fluid retention but can affect kidney function in some cases, but base on most recent labs from 8/7 hers has been normal. Advised that Dr. Dimple Casey said that Prednisone is associated with fluid retention and could contribute. Patient stated understanding of message.

## 2022-01-17 NOTE — Telephone Encounter (Signed)
Pantoprazole was prescribed by cardiology.  Should change be made to H2 blocker or should she continue Pantoprazole?

## 2022-01-17 NOTE — Telephone Encounter (Signed)
Methotrexate does not generally cause fluid retention. It can affect kidney function in some cases, but base on most recent labs from 8/7 hers has been normal. Prednisone is associated with fluid retention and could contribute.

## 2022-01-17 NOTE — Progress Notes (Signed)
     [address]       [place] [address]             [phone]       Patient: Home Provider: Office Consent for Telemedicine visit obtained.  Today's visit was completed via a real-time telehealth (see specific modality noted below). The patient/authorized person provided oral consent at the time of the visit to engage in a telemedicine encounter with the present provider at [facility]. The patient/authorized person was informed of the potential benefits, limitations, and risks of telemedicine. The patient/authorized person expressed understanding that the laws that protect confidentiality also apply to telemedicine. The patient/authorized person acknowledged understanding that telemedicine does not provide emergency services and that he or she would need to call 911 or proceed to the nearest hospital for help if such a need arose.   Total time spent in the clinical discussion 10 minutes.  Telehealth Modality: Phone visit (audio only)  I had a telephone visit with Mrs. Manson Passey.  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  The official read is up yet but on my review there does not appear to be any avidity in her pleural space or pleural effusion.  We discussed options which included VATS with mechanical pleurodesis as well as a Pleurx catheter.  She does not wish to pursue something that aggressive at this point.  She will continue to follow-up with Dr. Francine Graven.

## 2022-01-17 NOTE — Telephone Encounter (Signed)
-----   Message from Corky Crafts, MD sent at 01/15/2022 11:00 AM EDT ----- Please see Megan's note below.  WOuld not change Lasix.   ----- Message ----- From: Awilda Metro, RPH-CPP Sent: 01/15/2022   8:01 AM EDT To: Corky Crafts, MD  All PPIs interact with MTX and can increase concentrations of MTX. The interaction isn't as significant with lower doses of MTX used for rheumatoid arthritis though so she's probably ok to continue her current strategy of skipping her Protonix on MTX days. I'd defer to her prescribing physician but likely shouldn't need adjustment of her MTX dose. Otherwise, she could change to an H2 blocker, something like famotidine avoids the drug interaction altogether.  Lasix is her only drug I see that could contribute to hearing loss, agree she's on a low dose though and hearing loss is usually dose-related. MTX could cause some tinnitus too.  Thanks, Aundra Millet ----- Message ----- From: Corky Crafts, MD Sent: 01/14/2022   5:06 PM EDT To: Awilda Metro, RPH-CPP  She is now on MTX and was taking protonix.  She skips protonix on the day she takes MTX.  Does the methotrexate dose need to be adjusted? Would any of her meds cause hearing loss?  I doubt Lasix 20 mg daily would be the cause.

## 2022-01-21 ENCOUNTER — Encounter: Payer: Self-pay | Admitting: Pulmonary Disease

## 2022-01-21 ENCOUNTER — Ambulatory Visit (INDEPENDENT_AMBULATORY_CARE_PROVIDER_SITE_OTHER): Payer: Medicare Other | Admitting: Pulmonary Disease

## 2022-01-21 VITALS — BP 114/80 | HR 71 | Temp 98.3°F | Ht 62.0 in | Wt 162.6 lb

## 2022-01-21 DIAGNOSIS — R768 Other specified abnormal immunological findings in serum: Secondary | ICD-10-CM

## 2022-01-21 DIAGNOSIS — K658 Other peritonitis: Secondary | ICD-10-CM

## 2022-01-21 DIAGNOSIS — J90 Pleural effusion, not elsewhere classified: Secondary | ICD-10-CM | POA: Diagnosis not present

## 2022-01-21 NOTE — Patient Instructions (Addendum)
Continue 15mg  daily until further follow up with Dr. .   Continue methotrexate and folic acid per Dr. Dimple Casey   Follow up in 6 weeks

## 2022-01-21 NOTE — Telephone Encounter (Signed)
Corky Crafts, MD    OK with me to change to any available OTC H2 blocker

## 2022-01-21 NOTE — Progress Notes (Signed)
Synopsis: Referred in October 2022 for hospital follow up for pleural effusions  Subjective:   PATIENT ID: Rebecca Short GENDER: female DOB: 08-08-1940, MRN: [id number]  HPI  Chief Complaint  Patient presents with   Follow-up    Breathing is overall doing well. She denies any new co's today.    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   She started methotrexate therapy 7/21 and is currently on 15mg  weekly with folic acid 1mg  daily. She has continued on 15mg  prednisone daily. Recommended 2 months of methotrexate treatment prior to tapering of her steroids.  She was evalauted by CT surgery, PET scan is negative. She was offered VATs with mechanical pleuredesis or pleurX catheter placement but has opted not to pursue these options at this time. Chest radiograph 8/11 shows mild increase in left pleural effusion.  Her symptoms remain stable at this time on the above regimen.   She expressed frustration with her health and not feeling completely better after the last year and she expresses concern with the immunosuppression side effects.   OV 12/06/21 She was tapered down to 10mg  of prednisone daily on 6/12. She reported  progressive fatigue and shortness of breath over week on 7/3. Chest x-ray showed an increase in her left pleural effusion as well as return of a small right pleural effusion. She was admitted 7/4 and left pigtail chest tube was placed on 7/5. She drained a total of of fluid with minimal drainage there after. CT chest scan after chest tube placement showed LLL infiltrate vs atelecatsis and resolution of the effusion on the left. There was no need for pleural lytic therapy and her lung expanded appropriately. The effusion was again exudative. Cytology was negative for  malignancy.  Her prednisone was increased back to 20mg  daily an she continued on azathioprine 100mg  daily. She is feeling much better since before the hospital.  Dr. 8/12 of rheumatology was notified. We discussed transition to methotrexate from azathioprine.   Her pleural fluid ADA was not elevated and the AFB smear on pleural fluid is negative. Quantiferon gold is negative.   Chest radiograph today shows return of left pleural effusion, but not as significant as her 7/3 chest x-ray. Right effusion has decreased or resolved.   OV 09/3021 She was seen by 9/5, NP on 09/27/21 for acute visit due to worsening joint pains and concern for worsening of her pleural effusion. Chest radiograph was overall stable compared to March. It was recommended she continue on the scheduled prednisone taper.   Since that visit she has not had further issues with joint pains or return of dyspnea. She overall feels well. She is currently taking 15mg  prednisone daily and has stopped atovaquone antibiotic.  She complains of ear fullness and having trouble hearing and is scheduled to see ENT next month.   OV 09/13/21 She remains on slow prednisone taper and is down to 20mg  daily along with atovaquone prophylaxis. She saw Dr. Dimple Casey of rheumatology on 4/17 with no changes to her regimen.   She continues to feel well and continues her weekly exercise and strength regimen. She feels her stamina continues to improve.  OV 08/20/21 She continues to do well since last visit and her stamina is increasing and her balance is getting better. She is getting out  to walk in the local parks. She is currently taking 25mg  prednisone daily and atovaquone daily.   OV 07/23/21 She continues on imuran therapy 100mg  daily which has been increased from 50mg  daily by Dr. Dimple Casey her rheumatologist, note from 07/09/21 reviewed. She has started her prednisone taper and is currently taking 30mg  daily. She remains on atovaquone prophylaxis.  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  She has done well since last visit. No increase in shortness of breath. She continues to progress with her physical activity.  OV 06/25/21 She has been doing well since last visit. She was started on imuran per Dr. Dimple Casey and she has follow up with him on 07/09/21. She remains on 40mg  of prednisone daily. She is working with home PT with good results. She reports her daily activities are increasing as she is now able to vacuum 2 rooms in her home compared to 1 previously.   Chest radiograph today shows chronic left pleural effusion, stable from last month.  OV 05/22/21 She was evaluated by Dr. 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 Anti-smooth muscle anitbody is elevated. Cryoglobulins are negative.   Discussed pleural fluid studies further with Dr. Kenard Gower which showed mixed granulocytes and histioctyes along with some multinucleated giant cells. A definite diagnosis can't be placed on these findings, but he did raise concern for rheumatoid arthritis.   These upates were shared with the patient, her husband and friend.   Chest x-ray today shows persistent left pleural effusion and clear right lung.   Patient is feeling much better after increasing her prednisone back to 40mg  daily. She remains on prophylactic antibiotics. Her shoulder pain has also resolved with the prednisone.   She continues to work with PT each week and feels her conditioning in improving.  OV 04/24/21 She was seen in acute visit on 11/28 by [name], NP for increasing shortness of breath and pleuritic chest pain. She had tapered down to 5mg  of prednisone at this time. She had repeat thoracentesis on 04/08/21 based on CT chest from 04/22/21 which continued to show persistent left pleural effusion. Post-thoracentesis x-ray showed left pleural effusion and small right pleural  effusion. Chest x-ray on 11/28 shows increased left pleural effusion.   Patient complains of increasing shortness of breath, bilateral shoulder pain and increasing left-sided discomfort.  Labs from visit on 11/16 showed rheumatoid factor I30 previously 57.2 on 01/21/2021.  P ANCA titer was 1:80 on 04/10/2021.  The following labs were negative on 04/10/2021: ANA, anti-CCP, dsDNA antibody and previously negative for SSA and SSB and antihistone antibodies.  OV 03/06/21 She was admitted 8/27 to 9/7 for pericardial effusion and bilateral pleural effusions. She is status post pericardial drain and multiple thoracenteses. The pleural fluid studies were consistent with exudative process which were initially neutrophil predominant that later became lymphocyte predominant. Cytology was negative for malignancy but showed reactive mesothelial cells.  She is being treated for serositis with prednisone taper and colchicine for the pericardial effusion. She has been on 40mg  of prednisone over the past month. She has been on pneumocystis prophylaxis with atovaquone, she developed skin rash with bactrim. Her inflammatory workup was rather unrevealing as ANA, CCP anti-histone ab and SSA/SSB were negative. Her rheumatoid factor was elevated at 57.2.   She is feeling significantly better since discharge is resuming her normal activity and is quite satisfied with her recovering thus far.   Chest radiograph today shows opacities  at the left lung base. The right lung is significantly improved since prior study with resolution of right pleural effusion.  Past Medical History:  Diagnosis Date   A-fib [facility]    Allergy    Atrial fibrillation (HCC)    FH: cholecystectomy 1997   Macular degeneration    OSA (obstructive sleep apnea)    mild with total AHI 7.25/hr and severe during REM sleep at 35/hr now on CPAP at 6cm H2O     Family History  Problem Relation Age of Onset   Osteoporosis Mother    Macular degeneration  Mother    Heart Problems Father    Stroke Father    Diabetes Brother    Kidney failure Brother    Cancer Maternal Grandmother    Dementia Paternal Grandmother    Suicidality Other    Heart attack Neg Hx      Social History   Socioeconomic History   Marital status: Married    Spouse name: Not on file   Number of children: Not on file   Years of education: Not on file   Highest education level: Not on file  Occupational History   Not on file  Tobacco Use   Smoking status: Never   Smokeless tobacco: Never  Vaping Use   Vaping Use: Never used  Substance and Sexual Activity   Alcohol use: Never   Drug use: Never   Sexual activity: Not on file  Other Topics Concern   Not on file  Social History Narrative   ** Merged History Encounter **       Social Determinants of Health   Financial Resource Strain: Not on file  Food Insecurity: Not on file  Transportation Needs: Not on file  Physical Activity: Not on file  Stress: Not on file  Social Connections: Not on file  Intimate Partner Violence: Not on file     Allergies  Allergen Reactions   Erythromycin Other (See Comments)    Other reaction(s): burning   Sulfa Antibiotics     Other reaction(s): Unknown   Azithromycin Other (See Comments)    Pain in arm with IV infusion    Condrolite [Glucosamine-Chondroitin-Msm] Rash   Glucosamine Rash    Other reaction(s): rash Other reaction(s): rash   Sulfamethoxazole-Trimethoprim Rash    Other reaction(s): rash Other reaction(s): rash     Outpatient Medications Prior to Visit  Medication Sig Dispense Refill   apixaban (ELIQUIS) 5 MG TABS tablet TAKE 1 TABLET BY MOUTH TWICE A DAY 60 tablet 6   CALCIUM CITRATE-VITAMIN D PO Take 1 tablet by mouth at bedtime.     carboxymethylcellulose (REFRESH PLUS) 0.5 % SOLN Place 1 drop into both eyes 2 (two) times daily.     EPIPEN 2-PAK 0.3 MG/0.3ML SOAJ injection Inject 0.3 mg into the muscle once as needed for anaphylaxis (severe  allergic reaction).  0   folic acid (FOLVITE) 1 MG tablet Take 1 tablet (1 mg total) by mouth daily. 90 tablet 3   furosemide (LASIX) 20 MG tablet Take 1 tablet (20 mg total) by mouth daily. Please call [phone] to schedule an appointment for future refills. Thank you. 1st attempt. 30 tablet 0   methotrexate (RHEUMATREX) 2.5 MG tablet TAKE 6 TABLETS (15 MG TOTAL) BY MOUTH ONCE A WEEK. CAUTION:CHEMOTHERAPY. PROTECT FROM LIGHT. 78 tablet 0   metoprolol tartrate (LOPRESSOR) 25 MG tablet Take 1 tablet (25 mg total) by mouth 2 (two) times daily. May increase to twice daily as tolerated. 60 tablet  1   Multiple Vitamin (MULTIVITAMIN WITH MINERALS) TABS tablet Take 1 tablet by mouth every morning.     Multiple Vitamins-Minerals (PRESERVISION AREDS PO) Take 1 capsule by mouth 2 (two) times daily.     pantoprazole (PROTONIX) 40 MG tablet TAKE 1 TABLET BY MOUTH DAILY AT 12 NOON. 90 tablet 3   potassium chloride (KLOR-CON) 20 MEQ packet USE 1 PACKET ONCE DAILY 90 packet 2   predniSONE (DELTASONE) 10 MG tablet Take 2 tablets (20 mg total) by mouth daily with breakfast. (Patient taking differently: Take 15 mg by mouth daily with breakfast.) 60 tablet 3   PRESCRIPTION MEDICATION every 14 (fourteen) days. Allergy shots administered by Dr. [place]phalia Callas     No facility-administered medications prior to visit.   Review of Systems  Constitutional:  Negative for chills, fever, malaise/fatigue and weight loss.  HENT:  Negative for congestion, sinus pain and sore throat.   Eyes: Negative.   Respiratory:  Positive for shortness of breath (with exertion). Negative for cough, hemoptysis, sputum production and wheezing.   Cardiovascular:  Negative for chest pain, palpitations, orthopnea, claudication and leg swelling.  Gastrointestinal:  Negative for abdominal pain, heartburn, nausea and vomiting.  Genitourinary: Negative.   Musculoskeletal:  Negative for joint pain and myalgias.  Skin:  Negative for rash.  Neurological:   Negative for weakness.  Endo/Heme/Allergies:  Bruises/bleeds easily.  Psychiatric/Behavioral: Negative.     Objective:   Vitals:   01/21/22 0909  BP: 114/80  Pulse: 71  Temp: 98.3 F (36.8 C)  TempSrc: Oral  SpO2: 99%  Weight: 162 lb 9.6 oz (73.8 kg)  Height:  (1.575 m)    Physical Exam Constitutional:      General: She is not in acute distress.    Appearance: She is not ill-appearing.  HENT:     Head: Normocephalic and atraumatic.  Cardiovascular:     Rate and Rhythm: Normal rate and regular rhythm.     Pulses: Normal pulses.     Heart sounds: Normal heart sounds. No murmur heard. Pulmonary:     Effort: Pulmonary effort is normal.     Breath sounds: Examination of the left-middle field reveals decreased breath sounds. Examination of the left-lower field reveals decreased breath sounds. Decreased breath sounds present. No wheezing, rhonchi or rales.  Musculoskeletal:     Right lower leg: No edema.     Left lower leg: No edema.  Skin:    General: Skin is warm and dry.     Findings: Bruising (on hands b/l) present.  Neurological:     General: No focal deficit present.     Mental Status: She is alert.  Psychiatric:        Mood and Affect: Mood normal.        Behavior: Behavior normal.        Thought Content: Thought content normal.        Judgment: Judgment normal.    CBC    Component Value Date/Time   WBC 6.0 12/30/2021 1405   RBC 4.33 12/30/2021 1405   HGB 14.3 12/30/2021 1405   HGB 15.7 12/27/2018 0831   HCT 42.3 12/30/2021 1405   HCT 47.4 (H) 12/27/2018 0831   PLT 217 12/30/2021 1405   PLT 213 12/27/2018 0831   MCV 97.7 12/30/2021 1405   MCV 90 12/27/2018 0831   MCH 33.0 12/30/2021 1405   MCHC 33.8 12/30/2021 1405   RDW 14.2 12/30/2021 1405   RDW 13.1 12/27/2018 0831   LYMPHSABS 408 (L)  12/30/2021 1405   MONOABS 0.7 11/26/2021 1817   EOSABS 12 (L) 12/30/2021 1405   BASOSABS 18 12/30/2021 1405      Latest Ref Rng & Units 12/30/2021    2:05 PM  11/29/2021    1:03 AM 11/28/2021    5:33 AM  BMP  Glucose 65 - 99 mg/dL 621  308  89   BUN 7 - 25 mg/dL Creatinine 0.60 - 0.95 mg/dL 6.57  8.46  9.62   BUN/Creat Ratio 6 - 22 (calc) SEE NOTE:     Sodium 135 - 146 mmol/L 135  138  138   Potassium 3.5 - 5.3 mmol/L 4.9  3.5  3.7   Chloride 98 - 110 mmol/L 99  106  102   CO2 20 - 32 mmol/L Calcium 8.6 - 10.4 mg/dL 9.5  8.2  8.6    Chest imaging: CXR 12/06/21 Small left pleural effusion, decreased in size when compared with prior exam. Similar trace right pleural effusion.  CXR 11/25/21 1. Increased LEFT pleural effusion, now moderate to large in size. 2. Probable small RIGHT pleural effusion.  CXR 10/22/21 Stable left effusion. No blunting of the right costophrenic angle.  CXR 09/27/21 Mild-to-moderate cardiomegaly. Again seen is the obscuration of the left cardiac border and hemidiaphragm of pleural effusion with likely superimposed atelectasis/consolidation without significant interval change. There is some blunting of the right CP angle seen of likely small pleural effusion  CXR 08/20/21 Similar appearance of the chest x-ray with left-sided pleural effusion and associated atelectasis/consolidation  CXR 06/25/21 1. Stable small left pleural effusion. 2. No new focal lung infiltrate.  CXR 05/22/21 There is a small to medium size left pleural effusion, not significantly changed since 04/24/2021. Aeration of the left lung is also not significantly changed. The previously seen small right effusion has resolved. The right lung is now clear. There is no pneumothorax.  CXR 04/22/21 Increasing LEFT effusion and associated airspace disease. Correlate with any signs of infection. Given persistence/worsening underlying pulmonary or bronchial lesion would be difficult to exclude. Correlate with results of prior thoracentesis and consider follow-up chest CT for further evaluation as warranted.   Trace RIGHT effusion  with basilar atelectasis.  CXR 03/06/21 Resolved right-sided pleural effusion with persistent opacity on the left, likely a combination of persisting pleural fluid and associated atelectasis/consolidation  CXR 01/30/21 1. Slight interval decrease in size of the right pleural effusion with improved aeration of the right base. 2. No significant interval change of the larger left pleural effusion and adjacent airspace disease.  CT Chest 01/03/21 Moderate left and small right pleural effusions, which is increased on the left and new on the right in comparison to prior CT. Adjacent bibasilar and lingular atelectasis.   Increased, moderate-sized pericardial effusion. Biatrial enlargement.   Mild pulmonary edema.  PFT:     No data to display          Labs: 01/21/21: Quantiferon gold indeterminate  Path:  Echo 01/20/21:  1. Left ventricular ejection fraction, by estimation, is 60 to 65%. The  left ventricle has normal function. The left ventricle has no regional  wall motion abnormalities.   2. A small pericardial effusion is present. There is no evidence of  cardiac tamponade.  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  Her left pleural effusion and symptoms remain stable since hospital discharge. She was evaluated by CT surgery with negative PET CT scan for the left pleural space. She does not wish to pursue procedural intervention at this time.   She continues on 15mg  methorexate weekly per rheumatology. She continues on 15mg  of prednisone daily until follow up in October with Rheum. Will plan for steroid taper later this year.   Follow-up in 6  weeks.  , MD [facility] Office: [phone]   Current Outpatient Medications:    apixaban (ELIQUIS) 5 MG TABS tablet, TAKE 1 TABLET BY MOUTH TWICE A DAY, Disp: 60 tablet, Rfl: 6   CALCIUM CITRATE-VITAMIN D PO, Take 1 tablet by mouth at bedtime., Disp: , Rfl:    carboxymethylcellulose (REFRESH PLUS) 0.5 % SOLN, Place 1 drop into both eyes 2 (two) times daily., Disp: , Rfl:    EPIPEN 2-PAK 0.3 MG/0.3ML SOAJ injection, Inject 0.3 mg into the muscle once as needed for anaphylaxis (severe allergic reaction)., Disp: , Rfl: 0   folic acid (FOLVITE) 1 MG tablet, Take 1 tablet (1 mg total) by mouth daily., Disp: 90 tablet, Rfl: 3   furosemide (LASIX) 20 MG tablet, Take 1 tablet (20 mg total) by mouth daily. Please call [phone] to schedule an appointment for future refills. Thank you. 1st attempt., Disp: 30 tablet, Rfl: 0   methotrexate (RHEUMATREX) 2.5 MG tablet, TAKE 6 TABLETS (15 MG TOTAL) BY MOUTH ONCE A WEEK. CAUTION:CHEMOTHERAPY. PROTECT FROM LIGHT., Disp: 78 tablet, Rfl: 0   metoprolol tartrate (LOPRESSOR) 25 MG tablet, Take 1 tablet (25 mg total) by mouth 2 (two) times daily. May increase to twice daily as tolerated., Disp: 60 tablet, Rfl: 1   Multiple Vitamin (MULTIVITAMIN WITH MINERALS) TABS tablet, Take 1 tablet by mouth every morning., Disp: , Rfl:    Multiple Vitamins-Minerals (PRESERVISION AREDS PO), Take 1 capsule by mouth 2 (two) times daily., Disp: , Rfl:    pantoprazole (PROTONIX) 40 MG tablet, TAKE 1 TABLET BY MOUTH DAILY AT 12 NOON., Disp: 90 tablet, Rfl: 3   potassium chloride (KLOR-CON) 20 MEQ packet, USE 1 PACKET ONCE DAILY, Disp: 90 packet, Rfl: 2   predniSONE (DELTASONE) 10 MG tablet, Take 2 tablets (20 mg total) by mouth daily with breakfast. (Patient taking differently: Take 15 mg by mouth daily with breakfast.), Disp: 60 tablet, Rfl: 3   PRESCRIPTION MEDICATION, every 14 (fourteen) days. Allergy shots administered by Dr. [phone], Disp:  , Rfl:

## 2022-01-22 MED ORDER — FAMOTIDINE 10 MG PO TABS
ORAL_TABLET | ORAL | 6 refills | Status: AC
Start: 2022-01-22 — End: ?

## 2022-01-22 NOTE — Telephone Encounter (Signed)
Reviewed with Laural Golden, PharmD and recommendation is patient stop Pantoprazole and start OTC pepcid 10-20 mg twice daily as needed.  Patient notified.  She will let us know if pepcid does not work.

## 2022-02-17 ENCOUNTER — Encounter (INDEPENDENT_AMBULATORY_CARE_PROVIDER_SITE_OTHER): Payer: Self-pay | Admitting: Ophthalmology

## 2022-02-17 ENCOUNTER — Ambulatory Visit (INDEPENDENT_AMBULATORY_CARE_PROVIDER_SITE_OTHER): Payer: Medicare Other | Admitting: Ophthalmology

## 2022-02-17 ENCOUNTER — Other Ambulatory Visit: Payer: Self-pay

## 2022-02-17 DIAGNOSIS — H35371 Puckering of macula, right eye: Secondary | ICD-10-CM

## 2022-02-17 DIAGNOSIS — H43813 Vitreous degeneration, bilateral: Secondary | ICD-10-CM | POA: Diagnosis not present

## 2022-02-17 DIAGNOSIS — H353221 Exudative age-related macular degeneration, left eye, with active choroidal neovascularization: Secondary | ICD-10-CM | POA: Diagnosis not present

## 2022-02-17 DIAGNOSIS — H35372 Puckering of macula, left eye: Secondary | ICD-10-CM | POA: Diagnosis not present

## 2022-02-17 DIAGNOSIS — H35721 Serous detachment of retinal pigment epithelium, right eye: Secondary | ICD-10-CM

## 2022-02-17 MED ORDER — METOPROLOL TARTRATE 25 MG PO TABS: 25.0000 mg | ORAL_TABLET | Freq: Two times a day (BID) | ORAL | 11 refills | Status: DC

## 2022-02-17 MED ORDER — RANIBIZUMAB 0.5 MG/0.05ML IZ SOSY
0.5000 mg | PREFILLED_SYRINGE | INTRAVITREAL | Status: AC | PRN
  Administered 2022-02-17: .5 mg via INTRAVITREAL

## 2022-02-17 NOTE — Assessment & Plan Note (Signed)
OS with history of multiple recurrences, on Lucentis now at 59-month interval for maintenance dosing.  Patient would like to continue with injection today and repeat evaluation next in 4 months

## 2022-02-17 NOTE — Telephone Encounter (Signed)
Pt's medication was sent to pt's pharmacy as requested. Confirmation received.  °

## 2022-02-17 NOTE — Assessment & Plan Note (Signed)
No sign of CNVM 

## 2022-02-17 NOTE — Assessment & Plan Note (Signed)

## 2022-02-17 NOTE — Assessment & Plan Note (Signed)
Moderate OS no foveal distortion

## 2022-02-17 NOTE — Assessment & Plan Note (Signed)
Minor OD, NO IMPACT ON ACUITY

## 2022-02-17 NOTE — Progress Notes (Signed)
02/17/2022     CHIEF COMPLAINT Patient presents for  Chief Complaint  Patient presents with   Macular Degeneration      HISTORY OF PRESENT ILLNESS: Rebecca Short is a 81 y.o. female who presents to the clinic today for:   HPI   Age related macular degeneration of left eye  4 mths dilate ou lucentis o.5 os Pt states her vision has been stable Pt denies any new floaters or FOL  Last edited by Aleene Davidson, CMA on 02/17/2022  1:26 PM.      Referring physician: Daisy Floro, MD [address] [place],  [place] [address]  HISTORICAL INFORMATION:   Selected notes from the MEDICAL RECORD NUMBER    Lab Results  Component Value Date   HGBA1C 5.4 12/23/2020     CURRENT MEDICATIONS: Current Outpatient Medications (Ophthalmic Drugs)  Medication Sig   carboxymethylcellulose (REFRESH PLUS) 0.5 % SOLN Place 1 drop into both eyes 2 (two) times daily.   No current facility-administered medications for this visit. (Ophthalmic Drugs)   Current Outpatient Medications (Other)  Medication Sig   apixaban (ELIQUIS) 5 MG TABS tablet TAKE 1 TABLET BY MOUTH TWICE A DAY   CALCIUM CITRATE-VITAMIN D PO Take 1 tablet by mouth at bedtime.   EPIPEN 2-PAK 0.3 MG/0.3ML SOAJ injection Inject 0.3 mg into the muscle once as needed for anaphylaxis (severe allergic reaction).   famotidine (PEPCID) 10 MG tablet Take 1-2 tablets by mouth twice daily as needed   folic acid (FOLVITE) 1 MG tablet Take 1 tablet (1 mg total) by mouth daily.   furosemide (LASIX) 20 MG tablet Take 1 tablet (20 mg total) by mouth daily. Please call [phone] to schedule an appointment for future refills. Thank you. 1st attempt.   methotrexate (RHEUMATREX) 2.5 MG tablet TAKE 6 TABLETS (15 MG TOTAL) BY MOUTH ONCE A WEEK. CAUTION:CHEMOTHERAPY. PROTECT FROM LIGHT.   metoprolol tartrate (LOPRESSOR) 25 MG tablet Take 1 tablet (25 mg total) by mouth 2 (two) times daily. May increase to twice daily as tolerated.    Multiple Vitamin (MULTIVITAMIN WITH MINERALS) TABS tablet Take 1 tablet by mouth every morning.   Multiple Vitamins-Minerals (PRESERVISION AREDS PO) Take 1 capsule by mouth 2 (two) times daily.   potassium chloride (KLOR-CON) 20 MEQ packet USE 1 PACKET ONCE DAILY   predniSONE (DELTASONE) 10 MG tablet Take 2 tablets (20 mg total) by mouth daily with breakfast. (Patient taking differently: Take 15 mg by mouth daily with breakfast.)   PRESCRIPTION MEDICATION every 14 (fourteen) days. Allergy shots administered by Dr. Rudolph Callas   No current facility-administered medications for this visit. (Other)      REVIEW OF SYSTEMS: ROS   Negative for: Constitutional, Gastrointestinal, Neurological, Skin, Genitourinary, Musculoskeletal, HENT, Endocrine, Cardiovascular, Eyes, Respiratory, Psychiatric, Allergic/Imm, Heme/Lymph Last edited by Aleene Davidson, CMA on 02/17/2022  1:26 PM.       ALLERGIES Allergies  Allergen Reactions   Erythromycin Other (See Comments)    Other reaction(s): burning   Sulfa Antibiotics     Other reaction(s): Unknown   Azithromycin Other (See Comments)    Pain in arm with IV infusion    Condrolite [Glucosamine-Chondroitin-Msm] Rash   Glucosamine Rash    Other reaction(s): rash Other reaction(s): rash   Sulfamethoxazole-Trimethoprim Rash    Other reaction(s): rash Other reaction(s): rash    PAST MEDICAL HISTORY Past Medical History:  Diagnosis Date   A-fib [facility])    Allergy    Atrial fibrillation (HCC)  FH: cholecystectomy 1997   Macular degeneration    OSA (obstructive sleep apnea)    mild with total AHI 7.25/hr and severe during REM sleep at 35/hr now on CPAP at 6cm H2O   Past Surgical History:  Procedure Laterality Date   CHEST TUBE INSERTION Left 11/27/2021   Procedure: CHEST TUBE INSERTION;  Surgeon: Martina Sinner, MD;  Location: [facility] ENDOSCOPY;  Service: Pulmonary;  Laterality: Left;  Prefer AM slot, thanks   CHOLECYSTECTOMY     PERICARDIOCENTESIS  N/A 01/07/2021   Procedure: PERICARDIOCENTESIS;  Surgeon: Yvonne Kendall, MD;  Location: [facility] INVASIVE CV LAB;  Service: Cardiovascular;  Laterality: N/A;   REPLACEMENT TOTAL KNEE BILATERAL     THORACENTESIS N/A 01/25/2021   Procedure: THORACENTESIS;  Surgeon: Martina Sinner, MD;  Location: [facility] ENDOSCOPY;  Service: Pulmonary;  Laterality: N/A;   THORACENTESIS N/A 04/08/2021   Procedure: THORACENTESIS;  Surgeon: Martina Sinner, MD;  Location: [facility] ENDOSCOPY;  Service: Pulmonary;  Laterality: N/A;   THORACENTESIS N/A 04/24/2021   Procedure: THORACENTESIS;  Surgeon: Lupita Leash, MD;  Location: [facility] ENDOSCOPY;  Service: Cardiopulmonary;  Laterality: N/A;    FAMILY HISTORY Family History  Problem Relation Age of Onset   Osteoporosis Mother    Macular degeneration Mother    Heart Problems Father    Stroke Father    Diabetes Brother    Kidney failure Brother    Cancer Maternal Grandmother    Dementia Paternal Grandmother    Suicidality Other    Heart attack Neg Hx     SOCIAL HISTORY Social History   Tobacco Use   Smoking status: Never   Smokeless tobacco: Never  Vaping Use   Vaping Use: Never used  Substance Use Topics   Alcohol use: Never   Drug use: Never         OPHTHALMIC EXAM:  Base Eye Exam     Visual Acuity (ETDRS)       Right Left   Dist cc 20/20 -1 20/20 -1    Correction: Glasses         Tonometry (Tonopen, 1:31 PM)       Right Left   Pressure 6 8         Pupils       Pupils   Right PERRL   Left PERRL         Visual Fields       Left Right    Full Full         Extraocular Movement       Right Left    Full, Ortho Full, Ortho         Neuro/Psych     Oriented x3: Yes   Mood/Affect: Normal         Dilation     Both eyes: 1.0% Mydriacyl, 2.5% Phenylephrine @ 1:28 PM           Slit Lamp and Fundus Exam     External Exam       Right Left   External Normal Normal         Slit Lamp Exam       Right  Left   Lids/Lashes Normal Normal   Conjunctiva/Sclera White and quiet White and quiet   Cornea Clear Clear   Anterior Chamber Deep and quiet Deep and quiet   Iris Round and reactive Round and reactive   Lens Posterior chamber intraocular lens Posterior chamber intraocular lens   Anterior Vitreous Normal Normal  Fundus Exam       Right Left   Posterior Vitreous Posterior vitreous detachment Posterior vitreous detachment   Disc Normal Normal   C/D Ratio 0.55 0.25   Macula Retinal pigment epithelial mottling, Early age related macular degeneration, no hemorrhage, no exudates, no macular thickening, Hard drusen Retinal pigment epithelial mottling, Early age related macular degeneration, Pigmented atrophy, no hemorrhage, no exudates, no macular thickening, Soft drusen   Vessels Normal Normal   Periphery Normal Normal            IMAGING AND PROCEDURES  Imaging and Procedures for 02/17/22  OCT, Retina - OU - Both Eyes       Right Eye Quality was good. Scan locations included subfoveal. Central Foveal Thickness: 313. Progression has improved. Findings include no IRF, no SRF, abnormal foveal contour, retinal drusen , epiretinal membrane.   Left Eye Quality was good. Scan locations included subfoveal. Central Foveal Thickness: 318. Progression has improved. Findings include no IRF, no SRF, abnormal foveal contour, epiretinal membrane.   Notes OS, remained stable at  4 months interval with history of recurrences, will repeat intravitreal Lucentis today and examination again in 4 months.  OS epiretinal membrane nasal aspect to FAZ continues to thicken.  No impact on acuity at this However   We will continue to monitor region temporal to the fovea with irregular drusenoid pigment epithelial detachment  Minor epiretinal membrane right eye with no topographic distortion observed     Intravitreal Injection, Pharmacologic Agent - OS - Left Eye       Time Out 02/17/2022.  1:56 PM. Confirmed correct patient, procedure, site, and patient consented.   Anesthesia No anesthesia was used. Anesthetic medications included Lidocaine 4%.   Procedure Preparation included 5% betadine to ocular surface, 10% betadine to eyelids, Tobramycin 0.3%, Ofloxacin . A 30 gauge needle was used.   Injection: 0.5 mg Ranibizumab 0.5 MG/0.05ML   Route: Intravitreal, Site: Left Eye   NDC: 42706-237-62, Lot: [id number], Expiration date: 08/25/2023, Waste: 0 mL   Post-op Post injection exam found visual acuity of at least counting fingers. The patient tolerated the procedure well. There were no complications. The patient received written and verbal post procedure care education. Post injection medications included ocuflox.              ASSESSMENT/PLAN:  Macular pucker, right eye Minor OD, NO IMPACT ON ACUITY  Posterior vitreous detachment of both eyes  The nature of posterior vitreous detachment was discussed with the patient as well as its physiology, its age prevalence, and its possible implication regarding retinal breaks and detachment.  An informational brochure was offered to the patient.  All the patient's questions were answered.  The patient was asked to return if new or different flashes or floaters develops.   Patient was instructed to contact office immediately if any new changes were noticed. I explained to the patient that vitreous inside the eye is similar to jello inside a bowl. As the jello melts it can start to pull away from the bowl, similarly the vitreous throughout our lives can begin to pull away from the retina. That process is called a posterior vitreous detachment. In some cases, the vitreous can tug hard enough on the retina to form a retinal tear. I discussed with the patient the signs and symptoms of a retinal detachment.  Do not rub the eye.    Macular pucker, left eye Moderate OS no foveal distortion  Exudative age-related macular degeneration of left  eye  with active choroidal neovascularization ([place]) OS with history of multiple recurrences, on Lucentis now at 78-month interval for maintenance dosing.  Patient would like to continue with injection today and repeat evaluation next in 4 months  Macular pigment epithelial detachment, right No sign of CNVM     ICD-10-CM   1. 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HCC)  H35.3221 OCT, Retina - OU - Both Eyes    Intravitreal Injection, Pharmacologic Agent - OS - Left Eye    Ranibizumab SOSY 0.5 mg    2. Macular pucker, right eye  H35.371     3. Posterior vitreous detachment of both eyes  H43.813     4. Macular pucker, left eye  H35.372     5. Macular pigment epithelial detachment, right  H35.721       1.  OU with macular epiretinal membrane not impactful on acuity no impact on acuity.  Observe  2.  Intermediate ARMD with subfoveal PED no change over 18 months observe no sign of CNVM  3.  OS with wet AMD, history of multiple recurrences of CNVM controlled only with Lucentis in the past and now on extension of dilated examination and maintenance therapy at 4 months repeat examination next in 4 months after injection today  Ophthalmic Meds Ordered this visit:  Meds ordered this encounter  Medications   Ranibizumab SOSY 0.5 mg       Return in about 4 months (around 06/19/2022) for DILATE OU, LUCENTIS 0.5 OCT, OS.  There are no Patient Instructions on file for this visit.   Explained the diagnoses, plan, and follow up with the patient and they expressed understanding.  Patient expressed understanding of the importance of proper follow up care.   Clent Demark Brodey Bonn M.D. Diseases & Surgery of the Retina and Vitreous [facility] [place] 02/17/22     Abbreviations: M myopia (nearsighted); A astigmatism; H hyperopia (farsighted); P presbyopia; Mrx spectacle prescription;  CTL contact lenses; OD right eye; OS left eye;  OU both eyes  XT exotropia; ET esotropia; PEK punctate epithelial keratitis; PEE punctate epithelial erosions; DES dry eye syndrome; MGD meibomian gland dysfunction; ATs artificial tears; PFAT's preservative free artificial tears; [place] nuclear sclerotic cataract; PSC posterior subcapsular cataract; ERM epi-retinal membrane; PVD posterior vitreous detachment; RD retinal detachment; DM diabetes mellitus; DR diabetic retinopathy; NPDR non-proliferative diabetic retinopathy; PDR proliferative diabetic retinopathy; CSME clinically significant macular edema; DME diabetic macular edema; dbh dot blot hemorrhages; CWS cotton wool spot; POAG primary open angle glaucoma; C/D cup-to-disc ratio; HVF humphrey visual field; GVF goldmann visual field; OCT optical coherence tomography; IOP intraocular pressure; BRVO Branch retinal vein occlusion; CRVO central retinal vein occlusion; CRAO central retinal artery occlusion; BRAO branch retinal artery occlusion; RT retinal tear; SB scleral buckle; PPV pars plana vitrectomy; VH Vitreous hemorrhage; PRP panretinal laser photocoagulation; IVK intravitreal kenalog; VMT vitreomacular traction; MH Macular hole;  NVD neovascularization of the disc; NVE neovascularization elsewhere; AREDS age related eye disease study; ARMD age related macular degeneration; POAG primary open angle glaucoma; EBMD epithelial/anterior basement membrane dystrophy; ACIOL anterior chamber intraocular lens; IOL intraocular lens; PCIOL posterior chamber intraocular lens; Phaco/IOL phacoemulsification with intraocular lens placement; [place] photorefractive keratectomy; LASIK laser assisted in situ keratomileusis; HTN hypertension; DM diabetes mellitus; COPD chronic obstructive pulmonary disease

## 2022-02-17 NOTE — Progress Notes (Signed)
Office Visit Note  Patient: Rebecca Short             Date of Birth: April 28, 1941           MRN: [id number]             PCP: Lawerance Cruel, MD Referring: Lawerance Cruel, MD Visit Date: 03/03/2022   Subjective:  Follow-up (Wanting to reduce prednisone/medication use. Has questions about methotrexate.)   History of Present Illness: Rebecca Short is a 81 y.o. 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 So far her respiratory symptoms are doing well since last visit. Her mood and energy level are very poor, and frustrated about inability to socialize and limited exertion tolerance. She reports alternating days of more and less energy, sometimes associated with the methotrexate doses on Mondays but not always. Also having some concentration and memory difficulties increased from before for a few months.  Previous HPI 12/30/2021 [name] is a 81 y.o. 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 She was hospitalized last month with recurrent pleural effusion 7/4-7/7 requiring repeat thoracocentesis. Since discharge she is back onto higher dose prednisone and symptoms are doing okay. We stopped her azathioprine due to apparent treatment failure and she started methotrexate since 7/31 after negative hepatitis screening. She has not seen any problems with GI intolerance. She does feel somewhat unsteady or lightheaded and having to move slowly on account of this.   Previous HPI 09/09/2021 [name] is a 81 y.o. 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 She is doing well since last visit without major events. She has some mild GI irritation with the azathioprine. She has significant bruising on her hands and forearms. She saw Dr. Erin Fulling for follow  up with stable pulmonary symptoms and repeat chest xray obtained 3/28 was also stable. She is tapering the prednisone of a rate about 5 mg/day dose per month currently down to 20 mg daily. She is curious about safety going to the dentist for procedures on her medications.     Previous HPI 07/09/21 [name] is a 81 y.o. female here for follow up for rheumatoid pleural effusions after starting azathioprine 50 mg last month, continuing prednisone 40 mg daily and prophylactic atovaquone. She went for repeat chest xray with Dr. Erin Fulling that looked stable 2 weeks ago. So far she denies any major side effects after starting imuran.   Previous HPI 05/08/21 [name] is a 81 y.o. female here for evaluation with recurrent pleural effusions and dyspnea with positive RF and ANCA Abs. She was hospitalized in August after progressively worsening dyspnea and hypotension with exudative pleural fluid removed from left lung space followed by pericardial drainage and initial treatment with colchicine. She subsequently underwent additional thoracentesis drainage of effusions later in August and beginning of September. Results showed neutrophil predominant fluid. Prednisone 40 mg daily was added to treatment with stable condition. This continued for a month but tapering the prednisone resulted in more rapid re accumulation of fluid on a dose of about 20 gm daily or less. Imaging demonstrated progressive increase again in left sided effusions and repeat drainage again on 11/14 and 11/30 when down to 10 mg and 5 mg daily  doses of prednisone along with colchicine. She is now continuing prednisone 40 mg with atovaquone prophylaxis and colchicine 0.6 mg daily. In addition to pulmonary symptoms she reports bilateral shoulder pain and stiffness developing around the initial onset of her dyspnea before hospitalization in August.  This improved completely on 40 mg prednisone but she noticed definite recurrence of shoulder pain  symptoms when tapering down the prednisone to 10 or 5 mg/day dose.  No active complaints today since she is back on higher dose medication.  She denies any peripheral joint pain involvement did not notice any significant swelling or numbness in her upper extremities associated with the pain.     Review of Systems  Constitutional:  Positive for fatigue.  HENT:  Negative for mouth sores and mouth dryness.   Eyes:  Positive for dryness.  Respiratory:  Negative for shortness of breath.   Cardiovascular:  Positive for palpitations. Negative for chest pain.  Gastrointestinal:  Negative for blood in stool, constipation and diarrhea.  Endocrine: Positive for increased urination.  Genitourinary:  Positive for involuntary urination.  Musculoskeletal:  Negative for joint pain, gait problem, joint pain, joint swelling, myalgias, muscle weakness, morning stiffness, muscle tenderness and myalgias.  Skin:  Positive for color change, hair loss and sensitivity to sunlight. Negative for rash.  Allergic/Immunologic: Negative for susceptible to infections.  Neurological:  Negative for dizziness and headaches.  Hematological:  Negative for swollen glands.  Psychiatric/Behavioral:  Positive for depressed mood. Negative for sleep disturbance. The patient is nervous/anxious.     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) 12/23/2020   Musculoskeletal pain 12/23/2020   Posterior vitreous detachment of both eyes 04/30/2020   Macular pucker, right eye 11/08/2019   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[place]) 11/08/2019   Intermediate stage nonexudative age-related macular degeneration of both eyes 11/08/2019   Anticoagulated 11/18/2016   Bradycardia 03/23/2014   OSA (obstructive sleep apnea) 01/19/2014   Obesity (BMI 30-39.9) 01/19/2014   Paroxysmal A-fib ([place]) 10/04/2013   Nonspecific abnormal unspecified cardiovascular function study 07/21/2013   Acute gastroenteritis 07/15/2013    Past Medical History:  Diagnosis Date   A-fib [facility])    Allergy    Atrial fibrillation (HCC)    FH: cholecystectomy 1997   Macular degeneration    OSA (obstructive sleep apnea)    mild with total AHI 7.25/hr and severe during REM sleep at 35/hr now on CPAP at 6cm H2O    Family History  Problem Relation Age of Onset   Osteoporosis Mother    Macular degeneration Mother    Heart Problems Father    Stroke Father    Diabetes Brother    Kidney failure Brother    Cancer Maternal Grandmother    Dementia Paternal Grandmother    Suicidality Other    Heart attack Neg Hx  Past Surgical History:  Procedure Laterality Date   CHEST TUBE INSERTION Left 11/27/2021   Procedure: CHEST TUBE INSERTION;  Surgeon: Martina Sinner, MD;  Location[facility] ENDOSCOPY;  Service: Pulmonary;  Laterality: Left;  Prefer AM slot, thanks   CHOLECYSTECTOMY     PERICARDIOCENTESIS N/A 01/07/2021   Procedure: PERICARDIOCENTESIS;  Surgeon: Yvonne Kendall, MD;  Location: [facility] INVASIVE CV LAB;  Service: Cardiovascular;  Laterality: N/A;   REPLACEMENT TOTAL KNEE BILATERAL     THORACENTESIS N/A 01/25/2021   Procedure: THORACENTESIS;  Surgeon: Martina Sinner, MD;  Location[facility] ENDOSCOPY;  Service: Pulmonary;  Laterality: N/A;   THORACENTESIS N/A 04/08/2021    Procedure: THORACENTESIS;  Surgeon: Martina Sinner, MD;  Location: [facility] ENDOSCOPY;  Service: Pulmonary;  Laterality: N/A;   THORACENTESIS N/A 04/24/2021   Procedure: THORACENTESIS;  Surgeon: Lupita Leash, MD;  Location: [facility] ENDOSCOPY;  Service: Cardiopulmonary;  Laterality: N/A;   Social History   Social History Narrative   ** Merged History Encounter **       Immunization History  Administered Date(s) Administered   Influenza, High Dose Seasonal PF 07/05/2014, 03/03/2015, 06/29/2015, 07/09/2016, 03/03/2017, 07/24/2017, 02/05/2018, 06/23/2018, 06/08/2019, 02/18/2020, 06/08/2020   Influenza, Quadrivalent, Recombinant, Inj, Pf 03/04/2019   Influenza-Unspecified 01/24/2014   PFIZER(Purple Top)SARS-COV-2 Vaccination 06/16/2019, 07/07/2019, 02/18/2020, 06/08/2020   Pneumococcal Polysaccharide-23 03/22/2015, 06/29/2015, 07/09/2016, 07/24/2017, 06/23/2018, 06/08/2019, 06/08/2020, 06/03/2021   Zoster Recombinat (Shingrix) 03/09/2017     Objective: Vital Signs: BP 116/79 (BP Location: Left Arm, Patient Position: Sitting, Cuff Size: Normal)   Pulse 88   Resp 15   Ht 5\' 2"  (1.575 m)   Wt 164 lb 3.2 oz (74.5 kg)   BMI 30.03 kg/m    Physical Exam Cardiovascular:     Rate and Rhythm: Normal rate and regular rhythm.  Pulmonary:     Effort: Pulmonary effort is normal.     Comments: Decreased air movement audible over left lower lung field, normal work of breathing Musculoskeletal:     Right lower leg: Edema present.     Left lower leg: Edema present.  Skin:    General: Skin is warm and dry.     Findings: Bruising present.  Neurological:     Mental Status: She is alert.  Psychiatric:     Comments: Intermittently tearful discussing medical problems impact on activities      Musculoskeletal Exam:  Shoulders full ROM no tenderness or swelling Elbows full ROM no tenderness or swelling Wrists full ROM no tenderness or swelling Fingers full ROM no tenderness or swelling Knees  full ROM no tenderness or swelling  Investigation: No additional findings.  Imaging: DG Chest 2 View  Result Date: 03/17/2022 CLINICAL DATA:  81 year old female with pleural effusion EXAM: CHEST - 2 VIEW COMPARISON:  01/03/2022 FINDINGS: Cardiomediastinal silhouette unchanged in size and contour, with the left heart border obscured by lung/pleural disease. Persisting opacity at the left lung base obscuring the left heart border and left hemidiaphragm. Right lung relatively well aerated. No new airspace disease. No pneumothorax. Degenerative changes spine.  No displaced fracture. IMPRESSION: Unchanged left-sided pleural effusion with associated atelectasis/consolidation Electronically Signed   By: Gilmer Mor D.O.   On: 03/17/2022 17:09    Recent Labs: Lab Results  Component Value Date   WBC 7.4 03/03/2022   HGB 15.1 03/03/2022   PLT 219 03/03/2022   NA 138 03/03/2022   K 4.7 03/03/2022   CL 100 03/03/2022   CO2 31 03/03/2022   GLUCOSE 105 (H) 03/03/2022  BUN 18 03/03/2022   CREATININE 0.74 03/03/2022   BILITOT 0.5 03/03/2022   ALKPHOS 43 11/26/2021   AST 21 03/03/2022   ALT 27 03/03/2022   PROT 6.1 03/03/2022   ALBUMIN 2.7 (L) 11/26/2021   CALCIUM 9.4 03/03/2022   GFRAA 90 12/27/2018   QFTBGOLDPLUS Negative 11/28/2021    Speciality Comments: No specialty comments available.  Procedures:  No procedures performed Allergies: Erythromycin, Sulfa antibiotics, Azithromycin, Condrolite [glucosamine-chondroitin-msm], Glucosamine, and Sulfamethoxazole-trimethoprim   Assessment / Plan:     Visit Diagnoses: Rheumatoid arthritis involving multiple sites with positive rheumatoid factor ([place]) - Plan: Sedimentation rate  Symptoms well controlled again at this time but is still on a considerable prednisone dose.  No evidence of joint disease activity.  Subsequent imaging has shown stable left-sided effusion again.  Has tapering plan with pulmonology clinic appointment coming up.   Hopefully tolerating stepdown at small increments of 2.5 mg daily dose at the time.  Continuing methotrexate 15 mg p.o. weekly and folic acid 1 mg daily.  High risk medication use -  methotrexate 6 tablets once weekly - Plan: CBC with Differential/Platelet, COMPLETE METABOLIC PANEL WITH GFR  Checking CBC and CMP for methotrexate toxicity monitoring.  She is not having any symptomatic intolerance of the medication.   Orders: Orders Placed This Encounter  Procedures   Sedimentation rate   CBC with Differential/Platelet   COMPLETE METABOLIC PANEL WITH GFR   No orders of the defined types were placed in this encounter.    Follow-Up Instructions: Return in about 3 months (around 06/03/2022) for RA/PleuralEffusions on MTX/GC f/u 38mos.   Collier Salina, MD  Note - This record has been created using [employer].  Chart creation errors have been sought, but may not always  have been located. Such creation errors do not reflect on  the standard of medical care.

## 2022-03-03 ENCOUNTER — Encounter: Payer: Self-pay | Admitting: Internal Medicine

## 2022-03-03 ENCOUNTER — Ambulatory Visit: Payer: Medicare Other | Attending: Internal Medicine | Admitting: Internal Medicine

## 2022-03-03 VITALS — BP 116/79 | HR 88 | Resp 15 | Ht 62.0 in | Wt 164.2 lb

## 2022-03-03 DIAGNOSIS — J90 Pleural effusion, not elsewhere classified: Secondary | ICD-10-CM | POA: Insufficient documentation

## 2022-03-03 DIAGNOSIS — Z79899 Other long term (current) drug therapy: Secondary | ICD-10-CM | POA: Diagnosis not present

## 2022-03-03 DIAGNOSIS — M0579 Rheumatoid arthritis with rheumatoid factor of multiple sites without organ or systems involvement: Secondary | ICD-10-CM | POA: Insufficient documentation

## 2022-03-04 LAB — CBC WITH DIFFERENTIAL/PLATELET
Absolute Monocytes: 289 cells/uL (ref 200–950)
Basophils Absolute: 22 cells/uL (ref 0–200)
Basophils Relative: 0.3 %
Eosinophils Absolute: 0 cells/uL — ABNORMAL LOW (ref 15–500)
Eosinophils Relative: 0 %
HCT: 43.5 % (ref 35.0–45.0)
Hemoglobin: 15.1 g/dL (ref 11.7–15.5)
Lymphs Abs: 488 cells/uL — ABNORMAL LOW (ref 850–3900)
MCH: 32.1 pg (ref 27.0–33.0)
MCHC: 34.7 g/dL (ref 32.0–36.0)
MCV: 92.4 fL (ref 80.0–100.0)
MPV: 11 fL (ref 7.5–12.5)
Monocytes Relative: 3.9 %
Neutro Abs: 6601 cells/uL (ref 1500–7800)
Neutrophils Relative %: 89.2 %
Platelets: 219 10*3/uL (ref 140–400)
RBC: 4.71 10*6/uL (ref 3.80–5.10)
RDW: 15 % (ref 11.0–15.0)
Total Lymphocyte: 6.6 %
WBC: 7.4 10*3/uL (ref 3.8–10.8)

## 2022-03-04 LAB — COMPLETE METABOLIC PANEL WITH GFR
AG Ratio: 1.7 (calc) (ref 1.0–2.5)
ALT: 27 U/L (ref 6–29)
AST: 21 U/L (ref 10–35)
Albumin: 3.8 g/dL (ref 3.6–5.1)
Alkaline phosphatase (APISO): 56 U/L (ref 37–153)
BUN: 18 mg/dL (ref 7–25)
CO2: 31 mmol/L (ref 20–32)
Calcium: 9.4 mg/dL (ref 8.6–10.4)
Chloride: 100 mmol/L (ref 98–110)
Creat: 0.74 mg/dL (ref 0.60–0.95)
Globulin: 2.3 g/dL (calc) (ref 1.9–3.7)
Glucose, Bld: 105 mg/dL — ABNORMAL HIGH (ref 65–99)
Potassium: 4.7 mmol/L (ref 3.5–5.3)
Sodium: 138 mmol/L (ref 135–146)
Total Bilirubin: 0.5 mg/dL (ref 0.2–1.2)
Total Protein: 6.1 g/dL (ref 6.1–8.1)
eGFR: 81 mL/min/{1.73_m2} (ref >=60)

## 2022-03-04 LAB — SEDIMENTATION RATE: Sed Rate: 2 mm/h (ref 0–30)

## 2022-03-11 ENCOUNTER — Other Ambulatory Visit: Payer: Self-pay | Admitting: Interventional Cardiology

## 2022-03-17 ENCOUNTER — Ambulatory Visit (INDEPENDENT_AMBULATORY_CARE_PROVIDER_SITE_OTHER): Payer: Medicare Other | Admitting: Pulmonary Disease

## 2022-03-17 ENCOUNTER — Encounter: Payer: Self-pay | Admitting: Pulmonary Disease

## 2022-03-17 ENCOUNTER — Ambulatory Visit (INDEPENDENT_AMBULATORY_CARE_PROVIDER_SITE_OTHER): Payer: Medicare Other

## 2022-03-17 VITALS — BP 124/72 | HR 88 | Ht 62.0 in | Wt 166.0 lb

## 2022-03-17 DIAGNOSIS — K658 Other peritonitis: Secondary | ICD-10-CM | POA: Diagnosis not present

## 2022-03-17 DIAGNOSIS — J90 Pleural effusion, not elsewhere classified: Secondary | ICD-10-CM | POA: Diagnosis not present

## 2022-03-17 MED ORDER — PREDNISONE 5 MG PO TABS: 2.5000 mg | ORAL_TABLET | Freq: Every day | ORAL | 1 refills | Status: DC

## 2022-03-17 NOTE — Patient Instructions (Addendum)
Continue 12.5mg  prednisone daily  Continue methotrexate weekly  We will check an x-ray  Follow up in 1 month

## 2022-03-17 NOTE — Progress Notes (Signed)
Synopsis: Referred in October 2022 for hospital follow up for pleural effusions  Subjective:   PATIENT ID: Rebecca Short GENDER: female DOB: 10-05-1940, MRN: [id number]  HPI  Chief Complaint  Patient presents with   Follow-up    2 mo f/u. States she been trying to increase her exercise since last visit. Currently on prednisone 15mg  daily.    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   She was seen by Dr. Benjamine Mola 03/03/22. She has continued on methotrexate 15mg  weekly with folic acid. She continues ton 15mg  of prednisone daily.  She has been feeling fine since last visit. Repeat Chest x-ray shows persistent left pleural effusion.   OV 01/21/22 She started methotrexate therapy 7/21 and is currently on 15mg  weekly with folic acid 1mg  daily. She has continued on 15mg  prednisone daily. Recommended 2 months of methotrexate treatment prior to tapering of her steroids.  She was evalauted by CT surgery, PET scan is negative. She was offered VATs with mechanical pleuredesis or pleurX catheter placement but has opted not to pursue these options at this time. Chest radiograph 8/11 shows mild increase in left pleural effusion.  Her symptoms remain stable at this time on the above regimen.   She expressed frustration with her health and not feeling completely better after the last year and she expresses concern with the immunosuppression side effects.   OV 12/06/21 She was tapered down to 10mg  of prednisone daily on 6/12. She reported  progressive fatigue and shortness of breath over week on 7/3. Chest x-ray showed an increase in her left pleural effusion as well as return of a small right pleural effusion. She was admitted 7/4 and left pigtail chest tube was placed on 7/5. She drained a total of 8100mL  of fluid with minimal drainage there after. CT chest scan after chest tube placement showed LLL infiltrate vs atelecatsis and resolution of the effusion on the left. There was no need for pleural lytic therapy and her lung expanded appropriately. The effusion was again exudative. Cytology was negative for malignancy.  Her prednisone was increased back to 20mg  daily an she continued on azathioprine 100mg  daily. She is feeling much better since before the hospital.  Dr. Benjamine Mola of rheumatology was notified. We discussed transition to methotrexate from azathioprine.   Her pleural fluid ADA was not elevated and the AFB smear on pleural fluid is negative. Quantiferon gold is negative.   Chest radiograph today shows return of left pleural effusion, but not as significant as her 7/3 chest x-ray. Right effusion has decreased or resolved.   OV 09/3021 She was seen by Roxan Diesel, NP on 09/27/21 for acute visit due to worsening joint pains and concern for worsening of her pleural effusion. Chest radiograph was overall stable compared to March. It was recommended she continue on the scheduled prednisone taper.   Since that visit she has not had further issues with joint pains or return of dyspnea. She overall feels well. She is currently taking 15mg  prednisone daily and has stopped atovaquone antibiotic.  She complains of ear fullness and having trouble hearing and is scheduled to see ENT next month.   OV 09/13/21 She remains on slow prednisone taper and is down to 20mg  daily along with atovaquone prophylaxis. She saw Dr. Benjamine Mola of rheumatology on 4/17 with  no changes to her regimen.   She continues to feel well and continues her weekly exercise and strength regimen. She feels her stamina continues to improve.  OV 08/20/21 She continues to do well since last visit and her stamina is increasing and her balance is getting better. She is getting out to walk in the local parks. She is currently taking 25mg  prednisone  daily and atovaquone daily.   OV 07/23/21 She continues on imuran therapy 100mg  daily which has been increased from 50mg  daily by Dr. Dimple Casey her rheumatologist, note from 07/09/21 reviewed. She has started her prednisone taper and is currently taking 30mg  daily. She remains on atovaquone prophylaxis. 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  She has done well since last visit. No increase in shortness of breath. She continues to progress with her physical activity.  OV 06/25/21 She has been doing well since last visit. She was started on imuran per Dr. Dimple Casey and she has follow up with him on 07/09/21. She remains on 40mg  of prednisone daily. She is working with home PT with good results. She reports her daily activities are increasing as she is now able to vacuum 2 rooms in her home compared to 1 previously.   Chest radiograph today shows chronic left pleural effusion, stable from last month.  OV 05/22/21 She was evaluated by Dr. 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 Anti-smooth muscle anitbody is elevated. Cryoglobulins are negative.   Discussed pleural fluid studies further with Dr. Kenard Gower which showed mixed granulocytes and histioctyes along with some multinucleated giant cells. A definite diagnosis can't be placed on these findings, but he did raise concern for rheumatoid arthritis.   These upates were shared with the patient, her husband and friend.   Chest x-ray today shows persistent left pleural effusion and clear right lung.   Patient is feeling much better after increasing her prednisone back to 40mg  daily. She remains on prophylactic antibiotics. Her shoulder pain has also resolved with the prednisone.   She continues to work with PT each week and feels her conditioning in improving.  OV 04/24/21 She was seen in acute visit on 11/28 by [name], NP for increasing shortness  of breath and pleuritic chest pain. She had tapered down to 5mg  of prednisone at this time. She had repeat thoracentesis on 04/08/21 based on CT chest from 04/22/21 which continued to show persistent left pleural effusion. Post-thoracentesis x-ray showed left pleural effusion and small right pleural effusion. Chest x-ray on 11/28 shows increased left pleural effusion.   Patient complains of increasing shortness of breath, bilateral shoulder pain and increasing left-sided discomfort.  Labs from visit on 11/16 showed rheumatoid factor I30 previously 57.2 on 01/21/2021.  P ANCA titer was 1:80 on 04/10/2021.  The following labs were negative on 04/10/2021: ANA, anti-CCP, dsDNA antibody and previously negative for SSA and SSB and antihistone antibodies.  OV 03/06/21 She was admitted 8/27 to 9/7 for pericardial effusion and bilateral pleural effusions. She is status post pericardial drain and multiple thoracenteses. The pleural fluid studies were consistent with exudative process which were initially neutrophil predominant that later became lymphocyte predominant. Cytology was negative for malignancy but showed reactive mesothelial cells.  She is being treated for serositis with prednisone taper and colchicine for the pericardial effusion. She has been on 40mg  of prednisone over the past month. She has been on pneumocystis prophylaxis with atovaquone, she developed skin rash with  bactrim. Her inflammatory workup was rather unrevealing as ANA, CCP anti-histone ab and SSA/SSB were negative. Her rheumatoid factor was elevated at 57.2.   She is feeling significantly better since discharge is resuming her normal activity and is quite satisfied with her recovering thus far.   Chest radiograph today shows opacities at the left lung base. The right lung is significantly improved since prior study with resolution of right pleural effusion.  Past Medical History:  Diagnosis Date   A-fib [facility])    Allergy     Atrial fibrillation (HCC)    FH: cholecystectomy 1997   Macular degeneration    OSA (obstructive sleep apnea)    mild with total AHI 7.25/hr and severe during REM sleep at 35/hr now on CPAP at 6cm H2O     Family History  Problem Relation Age of Onset   Osteoporosis Mother    Macular degeneration Mother    Heart Problems Father    Stroke Father    Diabetes Brother    Kidney failure Brother    Cancer Maternal Grandmother    Dementia Paternal Grandmother    Suicidality Other    Heart attack Neg Hx      Social History   Socioeconomic History   Marital status: Married    Spouse name: Not on file   Number of children: Not on file   Years of education: Not on file   Highest education level: Not on file  Occupational History   Not on file  Tobacco Use   Smoking status: Never   Smokeless tobacco: Never  Vaping Use   Vaping Use: Never used  Substance and Sexual Activity   Alcohol use: Never   Drug use: Never   Sexual activity: Not on file  Other Topics Concern   Not on file  Social History Narrative   ** Merged History Encounter **       Social Determinants of Health   Financial Resource Strain: Not on file  Food Insecurity: Not on file  Transportation Needs: Not on file  Physical Activity: Not on file  Stress: Not on file  Social Connections: Not on file  Intimate Partner Violence: Not on file     Allergies  Allergen Reactions   Erythromycin Other (See Comments)    Other reaction(s): burning   Sulfa Antibiotics     Other reaction(s): Unknown   Azithromycin Other (See Comments)    Pain in arm with IV infusion    Condrolite [Glucosamine-Chondroitin-Msm] Rash   Glucosamine Rash    Other reaction(s): rash Other reaction(s): rash   Sulfamethoxazole-Trimethoprim Rash    Other reaction(s): rash Other reaction(s): rash     Outpatient Medications Prior to Visit  Medication Sig Dispense Refill   apixaban (ELIQUIS) 5 MG TABS tablet TAKE 1 TABLET BY MOUTH TWICE  A DAY 60 tablet 6   CALCIUM CITRATE-VITAMIN D PO Take 1 tablet by mouth at bedtime.     carboxymethylcellulose (REFRESH PLUS) 0.5 % SOLN Place 1 drop into both eyes 2 (two) times daily.     EPIPEN 2-PAK 0.3 MG/0.3ML SOAJ injection Inject 0.3 mg into the muscle once as needed for anaphylaxis (severe allergic reaction).  0   famotidine (PEPCID) 10 MG tablet Take 1-2 tablets by mouth twice daily as needed 60 tablet 6   folic acid (FOLVITE) 1 MG tablet Take 1 tablet (1 mg total) by mouth daily. 90 tablet 3   furosemide (LASIX) 20 MG tablet Take 1 tablet (20 mg total) by mouth  daily. 90 tablet 3   methotrexate (RHEUMATREX) 2.5 MG tablet TAKE 6 TABLETS (15 MG TOTAL) BY MOUTH ONCE A WEEK. CAUTION:CHEMOTHERAPY. PROTECT FROM LIGHT. 78 tablet 0   metoprolol tartrate (LOPRESSOR) 25 MG tablet Take 1 tablet (25 mg total) by mouth 2 (two) times daily. May increase to twice daily as tolerated. 60 tablet 11   Multiple Vitamin (MULTIVITAMIN WITH MINERALS) TABS tablet Take 1 tablet by mouth every morning.     Multiple Vitamins-Minerals (PRESERVISION AREDS PO) Take 1 capsule by mouth 2 (two) times daily.     potassium chloride (KLOR-CON) 20 MEQ packet USE 1 PACKET ONCE DAILY 90 packet 2   predniSONE (DELTASONE) 10 MG tablet Take 2 tablets (20 mg total) by mouth daily with breakfast. (Patient taking differently: Take 15 mg by mouth daily with breakfast.) 60 tablet 3   PRESCRIPTION MEDICATION every 14 (fourteen) days. Allergy shots administered by Dr. Sciotodale Callas (Patient not taking: Reported on 03/03/2022)     No facility-administered medications prior to visit.   Review of Systems  Constitutional:  Negative for chills, fever, malaise/fatigue and weight loss.  HENT:  Negative for congestion, sinus pain and sore throat.   Eyes: Negative.   Respiratory:  Positive for shortness of breath (with exertion). Negative for cough, hemoptysis, sputum production and wheezing.   Cardiovascular:  Negative for chest pain,  palpitations, orthopnea, claudication and leg swelling.  Gastrointestinal:  Negative for abdominal pain, heartburn, nausea and vomiting.  Genitourinary: Negative.   Musculoskeletal:  Negative for joint pain and myalgias.  Skin:  Negative for rash.  Neurological:  Negative for weakness.  Endo/Heme/Allergies:  Bruises/bleeds easily.  Psychiatric/Behavioral: Negative.     Objective:   Vitals:   03/17/22 1514  BP: 124/72  Pulse: 88  SpO2: 98%  Weight: 166 lb (75.3 kg)  Height: 5\' 2"  (1.575 m)    Physical Exam Constitutional:      General: She is not in acute distress.    Appearance: She is not ill-appearing.  HENT:     Head: Normocephalic and atraumatic.  Cardiovascular:     Rate and Rhythm: Normal rate and regular rhythm.     Pulses: Normal pulses.     Heart sounds: Normal heart sounds. No murmur heard. Pulmonary:     Effort: Pulmonary effort is normal.     Breath sounds: Examination of the left-middle field reveals decreased breath sounds. Examination of the left-lower field reveals decreased breath sounds. Decreased breath sounds present. No wheezing, rhonchi or rales.  Musculoskeletal:     Right lower leg: No edema.     Left lower leg: No edema.  Skin:    General: Skin is warm and dry.     Findings: Bruising (on hands b/l) present.  Neurological:     General: No focal deficit present.     Mental Status: She is alert.  Psychiatric:        Mood and Affect: Mood normal.        Behavior: Behavior normal.        Thought Content: Thought content normal.        Judgment: Judgment normal.    CBC    Component Value Date/Time   WBC 7.4 03/03/2022 1404   RBC 4.71 03/03/2022 1404   HGB 15.1 03/03/2022 1404   HGB 15.7 12/27/2018 0831   HCT 43.5 03/03/2022 1404   HCT 47.4 (H) 12/27/2018 0831   PLT 219 03/03/2022 1404   PLT 213 12/27/2018 0831   MCV 92.4 03/03/2022 1404  MCV 90 12/27/2018 0831   MCH 32.1 03/03/2022 1404   MCHC 34.7 03/03/2022 1404   RDW 15.0  03/03/2022 1404   RDW 13.1 12/27/2018 0831   LYMPHSABS 488 (L) 03/03/2022 1404   MONOABS 0.7 11/26/2021 1817   EOSABS 0 (L) 03/03/2022 1404   BASOSABS 22 03/03/2022 1404      Latest Ref Rng & Units 03/03/2022    2:04 PM 12/30/2021    2:05 PM 11/29/2021    1:03 AM  BMP  Glucose 65 - 99 mg/dL 324  401  027   BUN 7 - 25 mg/dL 18  16  15    Creatinine 0.60 - 0.95 mg/dL  2.53  6.64   BUN/Creat Ratio 6 - 22 (calc) SEE NOTE:  SEE NOTE:    Sodium 135 - 146 mmol/L 138  135  138   Potassium 3.5 - 5.3 mmol/L 4.7  4.9  3.5   Chloride 98 - 110 mmol/L 100  99  106   CO2 20 - 32 mmol/L 31  31  27    Calcium 8.6 - 10.4 mg/dL 9.4  9.5  8.2    Chest imaging: CXR 12/06/21 Small left pleural effusion, decreased in size when compared with prior exam. Similar trace right pleural effusion.  CXR 11/25/21 1. Increased LEFT pleural effusion, now moderate to large in size. 2. Probable small RIGHT pleural effusion.  CXR 10/22/21 Stable left effusion. No blunting of the right costophrenic angle.  CXR 09/27/21 Mild-to-moderate cardiomegaly. Again seen is the obscuration of the left cardiac border and hemidiaphragm of pleural effusion with likely superimposed atelectasis/consolidation without significant interval change. There is some blunting of the right CP angle seen of likely small pleural effusion  CXR 08/20/21 Similar appearance of the chest x-ray with left-sided pleural effusion and associated atelectasis/consolidation  CXR 06/25/21 1. Stable small left pleural effusion. 2. No new focal lung infiltrate.  CXR 05/22/21 There is a small to medium size left pleural effusion, not significantly changed since 04/24/2021. Aeration of the left lung is also not significantly changed. The previously seen small right effusion has resolved. The right lung is now clear. There is no pneumothorax.  CXR 04/22/21 Increasing LEFT effusion and associated airspace disease. Correlate with any signs of infection.  Given persistence/worsening underlying pulmonary or bronchial lesion would be difficult to exclude. Correlate with results of prior thoracentesis and consider follow-up chest CT for further evaluation as warranted.   Trace RIGHT effusion with basilar atelectasis.  CXR 03/06/21 Resolved right-sided pleural effusion with persistent opacity on the left, likely a combination of persisting pleural fluid and associated atelectasis/consolidation  CXR 01/30/21 1. Slight interval decrease in size of the right pleural effusion with improved aeration of the right base. 2. No significant interval change of the larger left pleural effusion and adjacent airspace disease.  CT Chest 01/03/21 Moderate left and small right pleural effusions, which is increased on the left and new on the right in comparison to prior CT. Adjacent bibasilar and lingular atelectasis.   Increased, moderate-sized pericardial effusion. Biatrial enlargement.   Mild pulmonary edema.  PFT:     No data to display          Labs: 01/21/21: Quantiferon gold indeterminate  Path:  Echo 01/20/21:  1. Left ventricular ejection fraction, by estimation, is 60 to 65%. The  left ventricle has normal function. The left ventricle has no regional  wall motion abnormalities.   2. A small pericardial effusion is present. There is no evidence of  cardiac tamponade.  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  Her left pleural effusion and symptoms remain stable since last visit. She was evaluated by CT surgery with negative PET CT scan for the left pleural space. She does not wish to  pursue procedural intervention at this time.   She is to taper to 12.5mg  prednisone daily and 15mg  methorexate weekly per rheumatology. We will plan to taper the prednisone 2.5mg  each month moving forward. Goal will be to taper off steroids but she may need a low dose of 5-7.5mg  daily along with the methotrexate as her maintenance regimen.  Follow-up in 1 month.  , MD [facility] Office: [phone]   Current Outpatient Medications:    apixaban (ELIQUIS) 5 MG TABS tablet, TAKE 1 TABLET BY MOUTH TWICE A DAY, Disp: 60 tablet, Rfl: 6   CALCIUM CITRATE-VITAMIN D PO, Take 1 tablet by mouth at bedtime., Disp: , Rfl:    carboxymethylcellulose (REFRESH PLUS) 0.5 % SOLN, Place 1 drop into both eyes 2 (two) times daily., Disp: , Rfl:    EPIPEN 2-PAK 0.3 MG/0.3ML SOAJ injection, Inject 0.3 mg into the muscle once as needed for anaphylaxis (severe allergic reaction)., Disp: , Rfl: 0   famotidine (PEPCID) 10 MG tablet, Take 1-2 tablets by mouth twice daily as needed, Disp: 60 tablet, Rfl: 6   folic acid (FOLVITE) 1 MG tablet, Take 1 tablet (1 mg total) by mouth daily., Disp: 90 tablet, Rfl: 3   furosemide (LASIX) 20 MG tablet, Take 1 tablet (20 mg total) by mouth daily., Disp: 90 tablet, Rfl: 3   methotrexate (RHEUMATREX) 2.5 MG tablet, TAKE 6 TABLETS (15 MG TOTAL) BY MOUTH ONCE A WEEK. CAUTION:CHEMOTHERAPY. PROTECT FROM LIGHT., Disp: 78 tablet, Rfl: 0   metoprolol tartrate (LOPRESSOR) 25 MG tablet, Take 1 tablet (25 mg total) by mouth 2 (two) times daily. May increase to twice daily as tolerated., Disp: 60 tablet, Rfl: 11   Multiple Vitamin (MULTIVITAMIN WITH MINERALS) TABS tablet, Take 1 tablet by mouth every morning., Disp: , Rfl:    Multiple Vitamins-Minerals (PRESERVISION AREDS PO), Take 1 capsule by mouth 2 (two) times daily., Disp: , Rfl:    potassium chloride (KLOR-CON) 20 MEQ packet, USE 1 PACKET ONCE DAILY, Disp: 90 packet, Rfl: 2   predniSONE (DELTASONE) 10  MG tablet, Take 2 tablets (20 mg total) by mouth daily with breakfast. (Patient taking differently: Take 15 mg by mouth daily with breakfast.), Disp: 60 tablet, Rfl: 3   predniSONE (DELTASONE) 5 MG tablet, Take 0.5 tablets (2.5 mg total) by mouth daily with breakfast., Disp: 30 tablet, Rfl: 1

## 2022-03-18 IMAGING — DX DG CHEST 2V
2 series · 2 of 2 positions shown · non-contrast
Comparison: April 08, 2021.

CLINICAL DATA: An 80-year-old female presents for evaluation of
pleural effusion with increased shortness of breath.

EXAM:
CHEST - 2 VIEW

[chest pa]
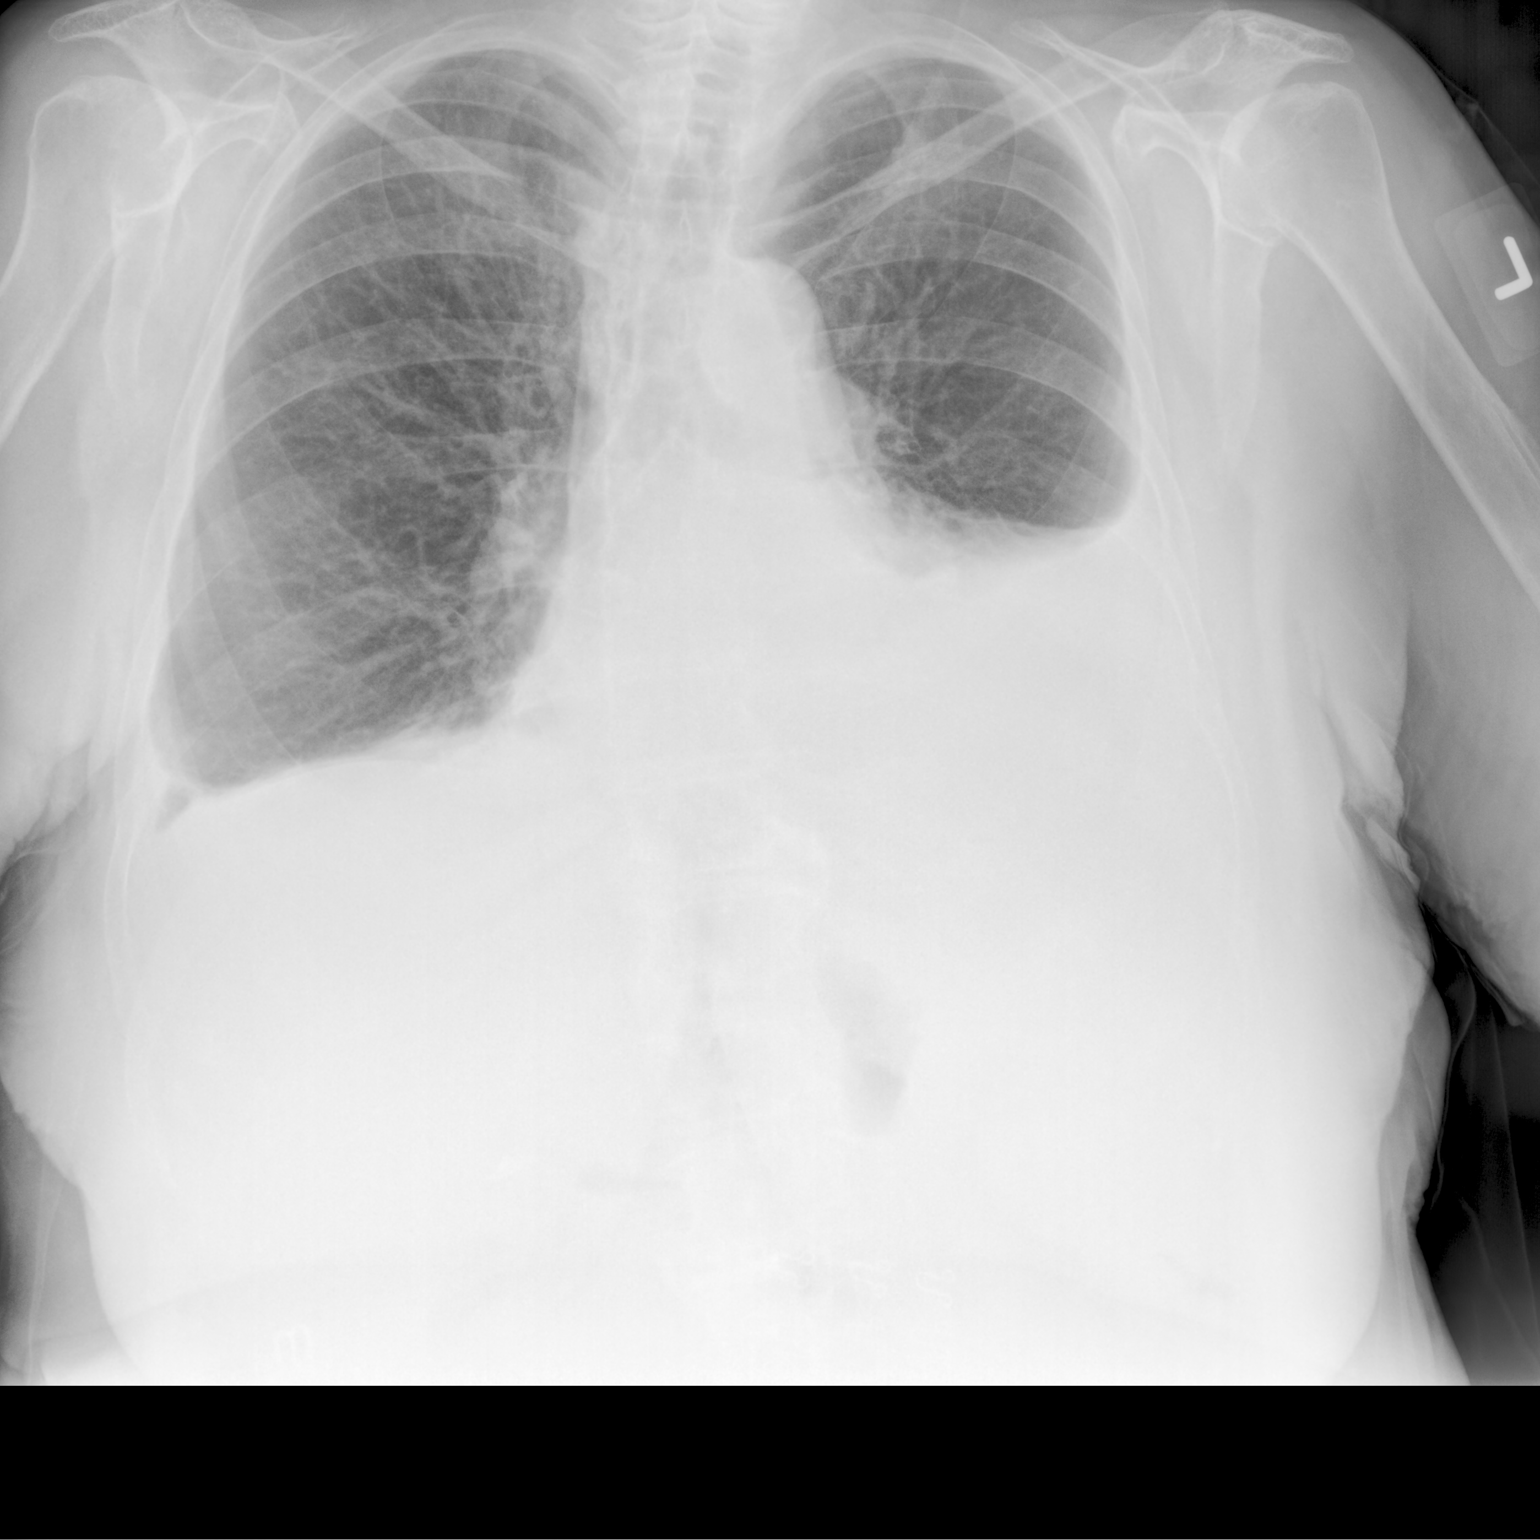

[chest lat]
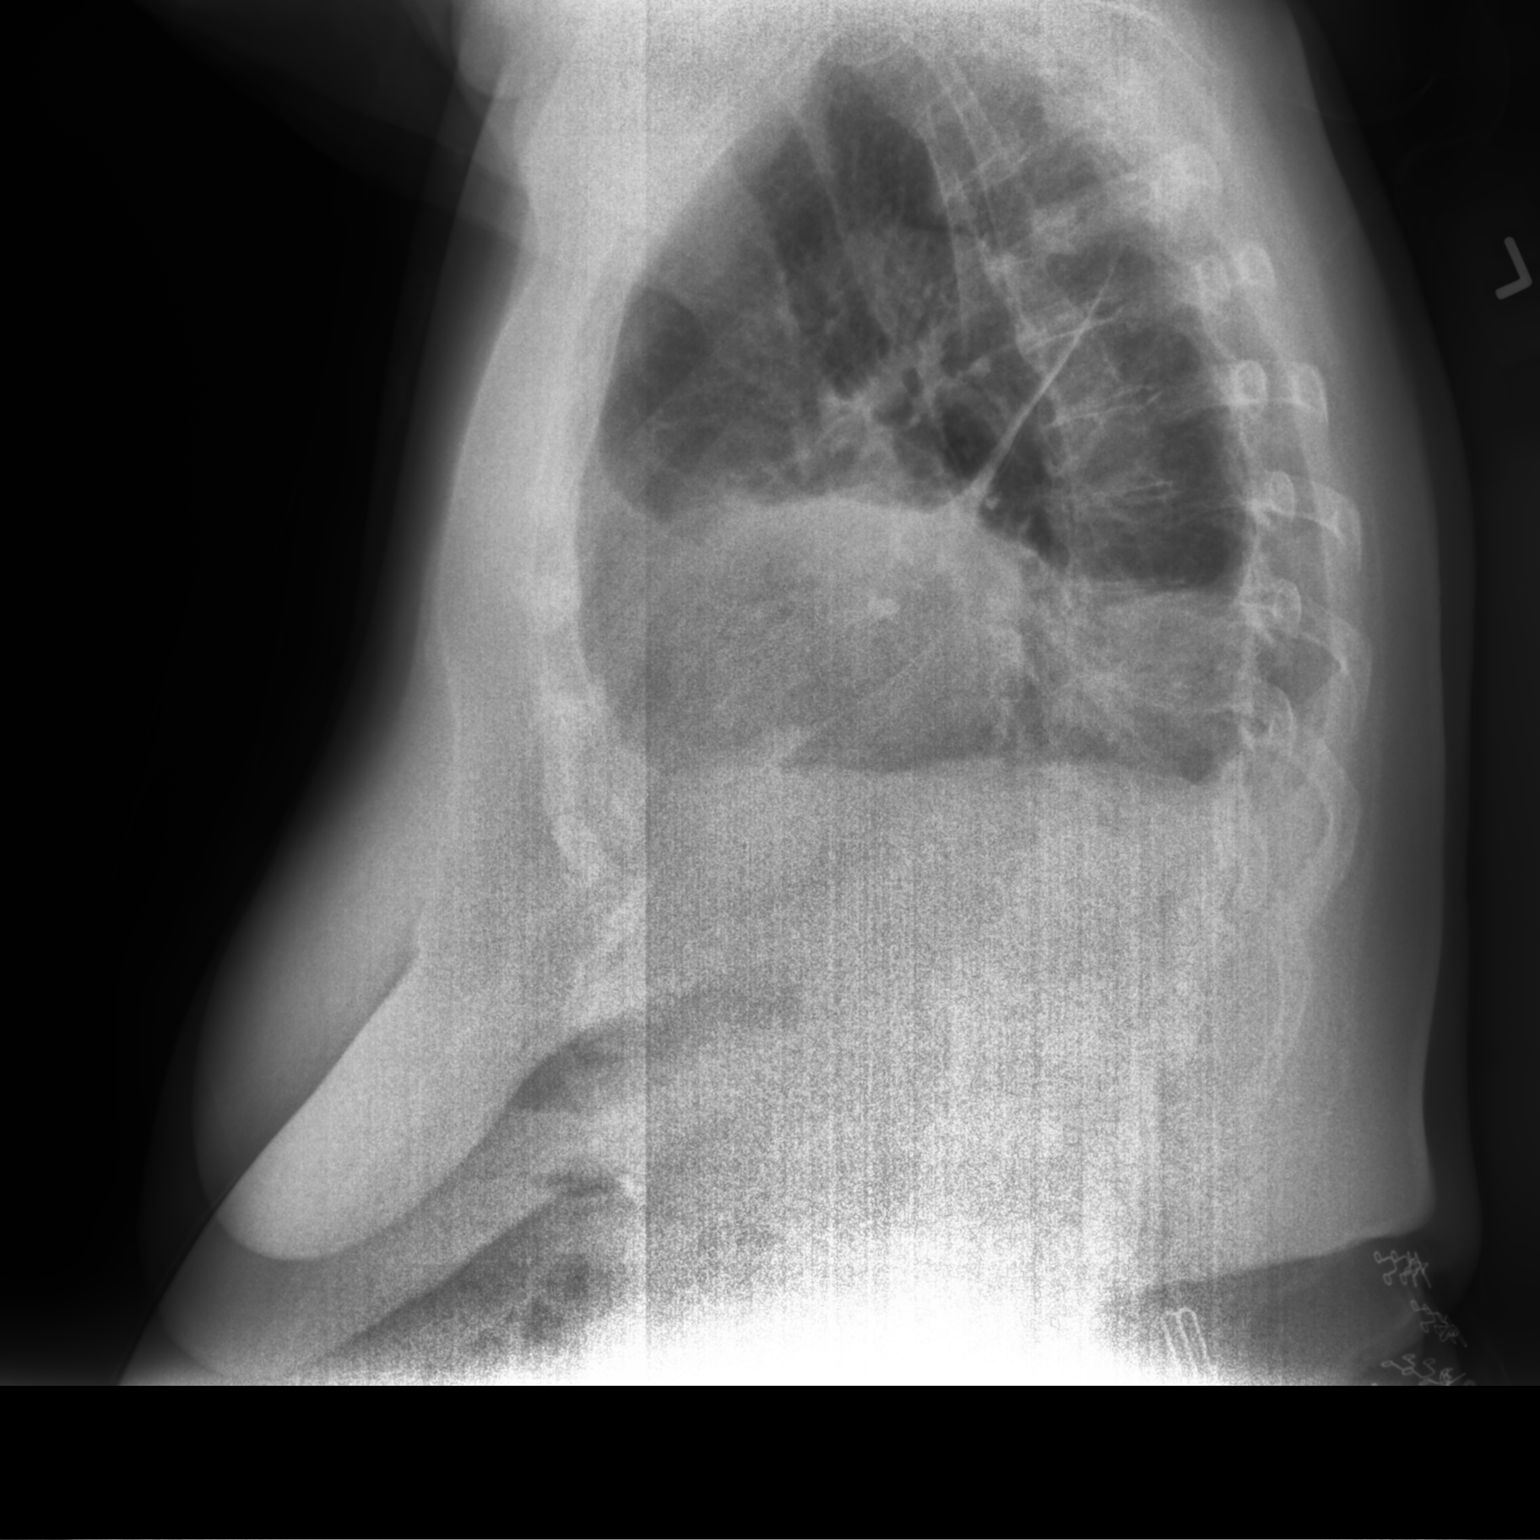

[2 of 2 positions shown; findings below may reference images not displayed]

FINDINGS: Increased density in the LEFT chest when compared to the previous
chest x-ray. LEFT heart border is obscured as is the LEFT
hemidiaphragm. Meniscus extends to approximately the level of the
LEFT seventh rib on the frontal view.

Mild increased interstitial markings throughout the chest. Blunting
of RIGHT costodiaphragmatic sulcus with linear airspace disease at
the RIGHT lung base.

On limited assessment there is no acute skeletal process.
IMPRESSION: Increasing LEFT effusion and associated airspace disease. Correlate
with any signs of infection. Given persistence/worsening underlying
pulmonary or bronchial lesion would be difficult to exclude.
Correlate with results of prior thoracentesis and consider follow-up
chest CT for further evaluation as warranted.

Trace RIGHT effusion with basilar atelectasis.

## 2022-03-20 IMAGING — DX DG CHEST 1V PORT
1 series · 1 of 1 positions shown · non-contrast
Comparison: Chest x-ray 04/08/2021.

CLINICAL DATA: Recurrent left pleural effusion.

EXAM:
PORTABLE CHEST 1 VIEW

[chest ap]
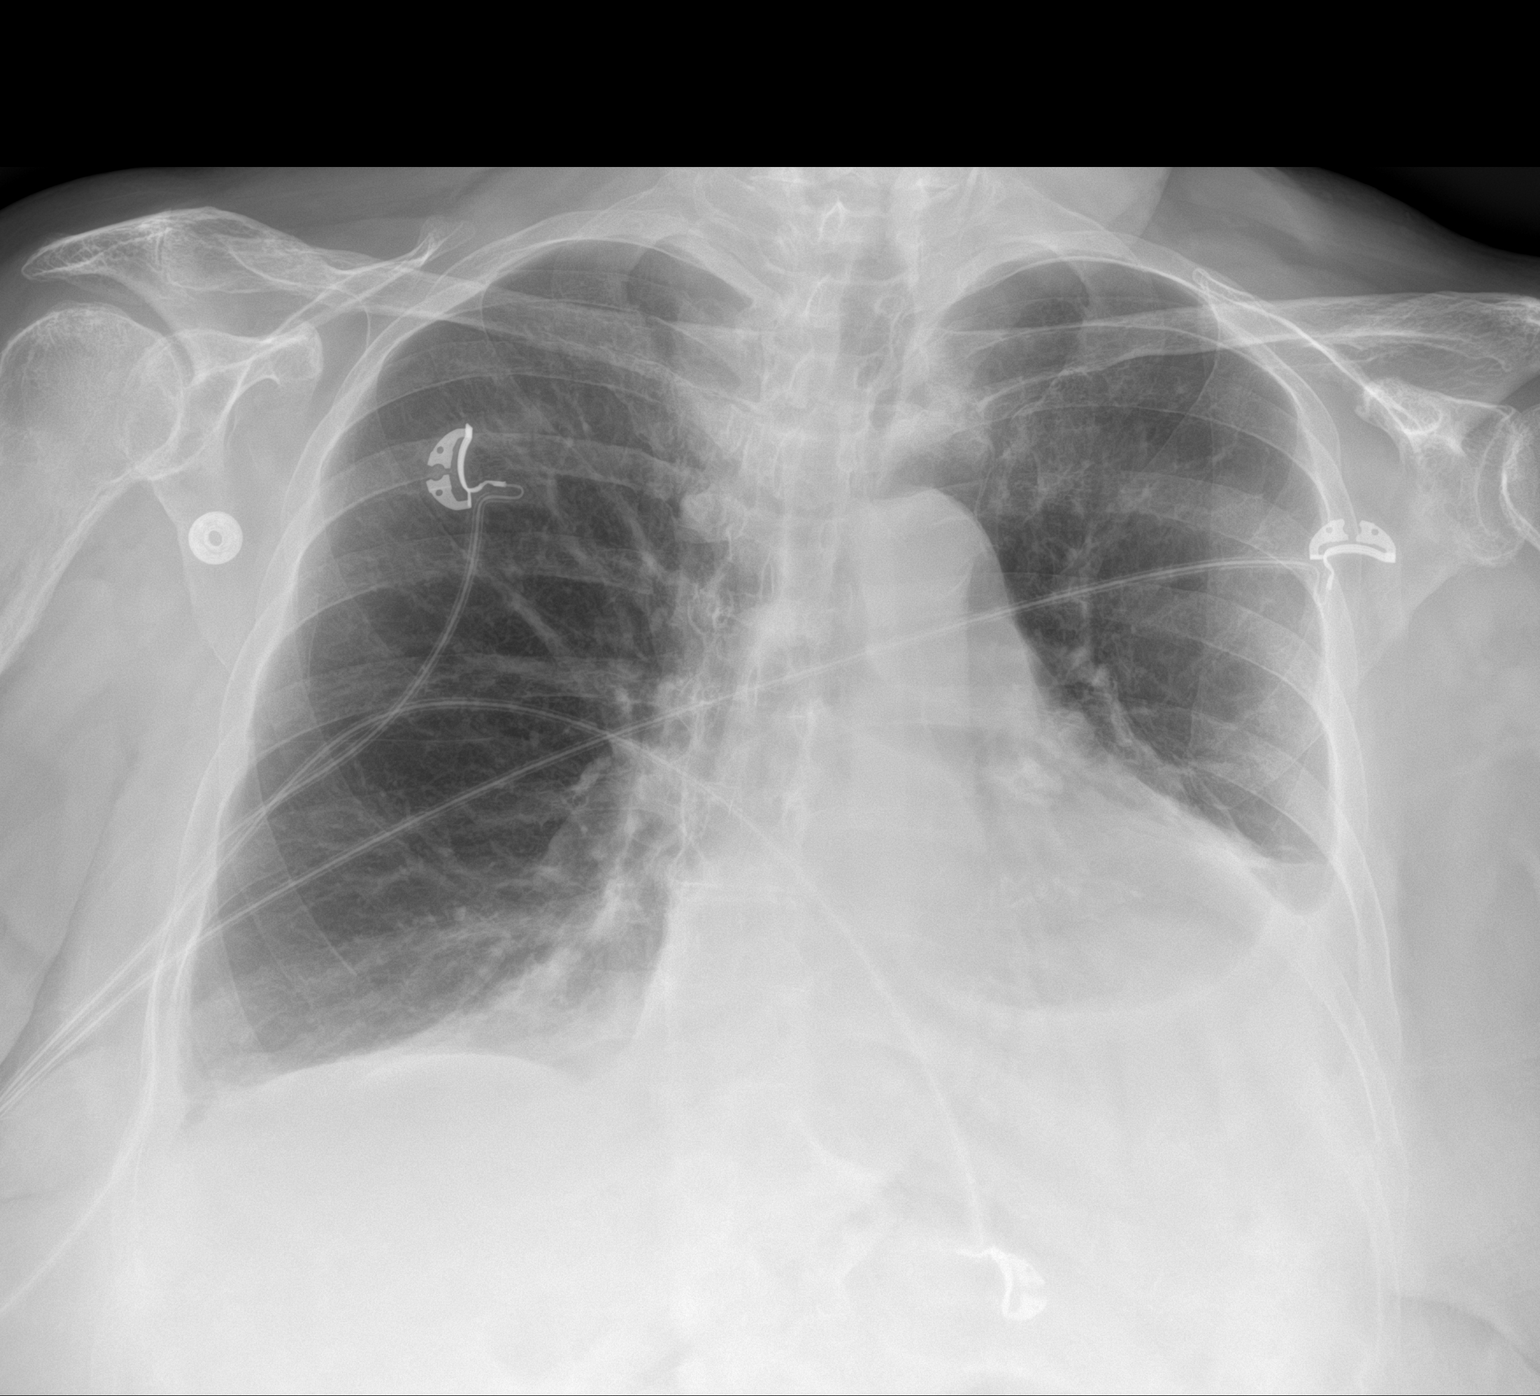

[1 of 1 positions shown; findings below may reference images not displayed]

FINDINGS: Small left pleural effusion is present. There is blunting of the
right costophrenic angle. There is some strandy opacities in both
lung bases. The cardiac silhouette is enlarged, unchanged. There is
no pneumothorax. No acute fractures are seen.
IMPRESSION: 1. Small left pleural effusion.
2. Minimal right pleural effusion versus scarring.
3. Bibasilar linear atelectasis/airspace disease.

## 2022-03-27 ENCOUNTER — Encounter: Payer: Self-pay | Admitting: Pulmonary Disease

## 2022-04-13 ENCOUNTER — Other Ambulatory Visit: Payer: Self-pay | Admitting: Internal Medicine

## 2022-04-14 ENCOUNTER — Encounter: Payer: Self-pay | Admitting: Pulmonary Disease

## 2022-04-14 ENCOUNTER — Ambulatory Visit (INDEPENDENT_AMBULATORY_CARE_PROVIDER_SITE_OTHER): Payer: Medicare Other | Admitting: Pulmonary Disease

## 2022-04-14 VITALS — BP 120/72 | HR 90 | Ht 62.0 in | Wt 165.6 lb

## 2022-04-14 DIAGNOSIS — K658 Other peritonitis: Secondary | ICD-10-CM

## 2022-04-14 DIAGNOSIS — J90 Pleural effusion, not elsewhere classified: Secondary | ICD-10-CM | POA: Diagnosis not present

## 2022-04-14 DIAGNOSIS — R768 Other specified abnormal immunological findings in serum: Secondary | ICD-10-CM

## 2022-04-14 NOTE — Progress Notes (Signed)
Synopsis: Referred in October 2022 for hospital follow up for pleural effusions  Subjective:   PATIENT ID: Rebecca Short GENDER: female DOB: [birth year], MRN: [id number]  HPI  Chief Complaint  Patient presents with   Follow-up    1 mo f/u for pleural effusion. States she has been doing well since last visit. Still on 12.5mg  of prednisone. Denies any new symptoms.    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   She was tapered to 12.5mg  of prednisone at last visit. She has been doing well since last visit. She continues methotrexate weekly.  OV 03/17/22 She was seen by Dr. Dimple Casey 03/03/22. She has continued on methotrexate 15mg  weekly with folic acid. She continues ton 15mg  of prednisone daily.  She has been feeling fine since last visit. Repeat Chest x-ray shows persistent left pleural effusion.   OV 01/21/22 She started methotrexate therapy 7/31 and is currently on 15mg  weekly with folic acid 1mg  daily. She has continued on 15mg  prednisone daily. Recommended 2 months of methotrexate treatment prior to tapering of her steroids.  She was evalauted by CT surgery, PET scan is negative. She was offered VATs with mechanical pleuredesis or pleurX catheter placement but has opted not to pursue these options at this time. Chest radiograph 8/11 shows mild increase in left pleural effusion.  Her symptoms remain stable at this time on the above regimen.   She expressed frustration with her health and not feeling completely better after the last year and she expresses concern with the immunosuppression side effects.   OV 12/06/21 She was tapered down to 10mg  of prednisone daily on 6/12. She reported  progressive fatigue and shortness of breath over week on 7/3. Chest x-ray showed an increase in her  left pleural effusion as well as return of a small right pleural effusion. She was admitted 7/4 and left pigtail chest tube was placed on 7/5. She drained a total of 10/11 of fluid with minimal drainage there after. CT chest scan after chest tube placement showed LLL infiltrate vs atelecatsis and resolution of the effusion on the left. There was no need for pleural lytic therapy and her lung expanded appropriately. The effusion was again exudative. Cytology was negative for malignancy.  Her prednisone was increased back to 20mg  daily an she continued on azathioprine 100mg  daily. She is feeling much better since before the hospital.  Dr. 12/08/21 of rheumatology was notified. We discussed transition to methotrexate from azathioprine.   Her pleural fluid ADA was not elevated and the AFB smear on pleural fluid is negative. Quantiferon gold is negative.   Chest radiograph today shows return of left pleural effusion, but not as significant as her 7/3 chest x-ray. Right effusion has decreased or resolved.   OV 09/3021 She was seen by 8/12, NP on 09/27/21 for acute visit due to worsening joint pains and concern for worsening of her pleural effusion. Chest radiograph was overall stable compared to March. It was recommended she continue on the scheduled prednisone taper.   Since that visit she has not had further issues with joint pains or return of dyspnea. She overall feels well. She is currently taking 15mg  prednisone daily and has stopped atovaquone antibiotic.  She complains of ear fullness and having trouble hearing and is scheduled to see ENT next  month.   OV 09/13/21 She remains on slow prednisone taper and is down to 20mg  daily along with atovaquone prophylaxis. She saw Dr. Dimple Casey of rheumatology on 4/17 with no changes to her regimen.   She continues to feel well and continues her weekly exercise and strength regimen. She feels her stamina continues to improve.  OV 08/20/21 She continues to do  well since last visit and her stamina is increasing and her balance is getting better. She is getting out to walk in the local parks. She is currently taking 25mg  prednisone daily and atovaquone daily.   OV 07/23/21 She continues on imuran therapy 100mg  daily which has been increased from 50mg  daily by Dr. Dimple Casey her rheumatologist, note from 07/09/21 reviewed. She has started her prednisone taper and is currently taking 30mg  daily. She remains on atovaquone prophylaxis. 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  She has done well since last visit. No increase in shortness of breath. She continues to progress with her physical activity.  OV 06/25/21 She has been doing well since last visit. She was started on imuran per Dr. Dimple Casey and she has follow up with him on 07/09/21. She remains on 40mg  of prednisone daily. She is working with home PT with good results. She reports her daily activities are increasing as she is now able to vacuum 2 rooms in her home compared to 1 previously.   Chest radiograph today shows chronic left pleural effusion, stable from last month.  OV 05/22/21 She was evaluated by Dr. 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 Anti-smooth muscle anitbody is elevated. Cryoglobulins are negative.   Discussed pleural fluid studies further with Dr. Kenard Gower which showed mixed granulocytes and histioctyes along with some multinucleated giant cells. A definite diagnosis can't be placed on these findings, but he did raise concern for rheumatoid arthritis.   These upates were shared with the patient, her husband and friend.   Chest x-ray today shows persistent left pleural effusion and clear right lung.   Patient is feeling much better after increasing her prednisone back to 40mg  daily. She remains on prophylactic antibiotics. Her shoulder pain has also resolved with the prednisone.   She  continues to work with PT each week and feels her conditioning in improving.  OV 04/24/21 She was seen in acute visit on 11/28 by [name], NP for increasing shortness of breath and pleuritic chest pain. She had tapered down to 5mg  of prednisone at this time. She had repeat thoracentesis on 04/08/21 based on CT chest from 04/22/21 which continued to show persistent left pleural effusion. Post-thoracentesis x-ray showed left pleural effusion and small right pleural effusion. Chest x-ray on 11/28 shows increased left pleural effusion.   Patient complains of increasing shortness of breath, bilateral shoulder pain and increasing left-sided discomfort.  Labs from visit on 11/16 showed rheumatoid factor I30 previously 57.2 on 01/21/2021.  P ANCA titer was 1:80 on 04/10/2021.  The following labs were negative on 04/10/2021: ANA, anti-CCP, dsDNA antibody and previously negative for SSA and SSB and antihistone antibodies.  OV 03/06/21 She was admitted 8/27 to 9/7 for pericardial effusion and bilateral pleural effusions. She is status post pericardial drain and multiple thoracenteses. The pleural fluid studies were consistent with exudative process which were initially neutrophil predominant that later became lymphocyte predominant. Cytology was negative for malignancy but showed reactive mesothelial cells.  She is being treated for serositis with prednisone taper  and colchicine for the pericardial effusion. She has been on 40mg  of prednisone over the past month. She has been on pneumocystis prophylaxis with atovaquone, she developed skin rash with bactrim. Her inflammatory workup was rather unrevealing as ANA, CCP anti-histone ab and SSA/SSB were negative. Her rheumatoid factor was elevated at 57.2.   She is feeling significantly better since discharge is resuming her normal activity and is quite satisfied with her recovering thus far.   Chest radiograph today shows opacities at the left lung base. The  right lung is significantly improved since prior study with resolution of right pleural effusion.  Past Medical History:  Diagnosis Date   A-fib [facility])    Allergy    Atrial fibrillation (HCC)    FH: cholecystectomy 1997   Macular degeneration    OSA (obstructive sleep apnea)    mild with total AHI 7.25/hr and severe during REM sleep at 35/hr now on CPAP at 6cm H2O     Family History  Problem Relation Age of Onset   Osteoporosis Mother    Macular degeneration Mother    Heart Problems Father    Stroke Father    Diabetes Brother    Kidney failure Brother    Cancer Maternal Grandmother    Dementia Paternal Grandmother    Suicidality Other    Heart attack Neg Hx      Social History   Socioeconomic History   Marital status: Married    Spouse name: Not on file   Number of children: Not on file   Years of education: Not on file   Highest education level: Not on file  Occupational History   Not on file  Tobacco Use   Smoking status: Never   Smokeless tobacco: Never  Vaping Use   Vaping Use: Never used  Substance and Sexual Activity   Alcohol use: Never   Drug use: Never   Sexual activity: Not on file  Other Topics Concern   Not on file  Social History Narrative   ** Merged History Encounter **       Social Determinants of Health   Financial Resource Strain: Not on file  Food Insecurity: Not on file  Transportation Needs: Not on file  Physical Activity: Not on file  Stress: Not on file  Social Connections: Not on file  Intimate Partner Violence: Not on file     Allergies  Allergen Reactions   Erythromycin Other (See Comments)    Other reaction(s): burning   Sulfa Antibiotics     Other reaction(s): Unknown   Azithromycin Other (See Comments)    Pain in arm with IV infusion    Condrolite [Glucosamine-Chondroitin-Msm] Rash   Glucosamine Rash    Other reaction(s): rash Other reaction(s): rash   Sulfamethoxazole-Trimethoprim Rash    Other reaction(s):  rash Other reaction(s): rash     Outpatient Medications Prior to Visit  Medication Sig Dispense Refill   apixaban (ELIQUIS) 5 MG TABS tablet TAKE 1 TABLET BY MOUTH TWICE A DAY 60 tablet 6   CALCIUM CITRATE-VITAMIN D PO Take 1 tablet by mouth at bedtime.     carboxymethylcellulose (REFRESH PLUS) 0.5 % SOLN Place 1 drop into both eyes 2 (two) times daily.     EPIPEN 2-PAK 0.3 MG/0.3ML SOAJ injection Inject 0.3 mg into the muscle once as needed for anaphylaxis (severe allergic reaction).  0   famotidine (PEPCID) 10 MG tablet Take 1-2 tablets by mouth twice daily as needed 60 tablet 6   folic acid (FOLVITE)  1 MG tablet Take 1 tablet (1 mg total) by mouth daily. 90 tablet 3   furosemide (LASIX) 20 MG tablet Take 1 tablet (20 mg total) by mouth daily. 90 tablet 3   methotrexate (RHEUMATREX) 2.5 MG tablet TAKE 6 TABLETS (15 MG TOTAL) BY MOUTH ONCE A WEEK. CAUTION:CHEMOTHERAPY. PROTECT FROM LIGHT. 78 tablet 0   metoprolol tartrate (LOPRESSOR) 25 MG tablet Take 1 tablet (25 mg total) by mouth 2 (two) times daily. May increase to twice daily as tolerated. 60 tablet 11   Multiple Vitamin (MULTIVITAMIN WITH MINERALS) TABS tablet Take 1 tablet by mouth every morning.     Multiple Vitamins-Minerals (PRESERVISION AREDS PO) Take 1 capsule by mouth 2 (two) times daily.     potassium chloride (KLOR-CON) 20 MEQ packet USE 1 PACKET ONCE DAILY 90 packet 2   predniSONE (DELTASONE) 10 MG tablet Take 2 tablets (20 mg total) by mouth daily with breakfast. (Patient taking differently: Take 12.5 mg by mouth daily with breakfast.) 60 tablet 3   predniSONE (DELTASONE) 5 MG tablet Take 0.5 tablets (2.5 mg total) by mouth daily with breakfast. 30 tablet 1   No facility-administered medications prior to visit.   Review of Systems  Constitutional:  Negative for chills, fever, malaise/fatigue and weight loss.  HENT:  Negative for congestion, sinus pain and sore throat.   Eyes: Negative.   Respiratory:  Positive for  shortness of breath (with exertion). Negative for cough, hemoptysis, sputum production and wheezing.   Cardiovascular:  Negative for chest pain, palpitations, orthopnea, claudication and leg swelling.  Gastrointestinal:  Negative for abdominal pain, heartburn, nausea and vomiting.  Genitourinary: Negative.   Musculoskeletal:  Negative for joint pain and myalgias.  Skin:  Negative for rash.  Neurological:  Negative for weakness.  Endo/Heme/Allergies:  Bruises/bleeds easily.  Psychiatric/Behavioral: Negative.     Objective:   Vitals:   04/14/22 0926  BP: 120/72  Pulse: 90  SpO2: 100%  Weight: 165 lb 9.6 oz (75.1 kg)  Height:  (1.575 m)    Physical Exam Constitutional:      General: She is not in acute distress.    Appearance: She is not ill-appearing.  HENT:     Head: Normocephalic and atraumatic.  Cardiovascular:     Rate and Rhythm: Normal rate and regular rhythm.     Pulses: Normal pulses.     Heart sounds: Normal heart sounds. No murmur heard. Pulmonary:     Effort: Pulmonary effort is normal.     Breath sounds: Examination of the left-middle field reveals decreased breath sounds. Examination of the left-lower field reveals decreased breath sounds. Decreased breath sounds present. No wheezing, rhonchi or rales.  Musculoskeletal:     Right lower leg: No edema.     Left lower leg: No edema.  Skin:    General: Skin is warm and dry.     Findings: Bruising (on hands b/l) present.  Neurological:     General: No focal deficit present.     Mental Status: She is alert.  Psychiatric:        Mood and Affect: Mood normal.        Behavior: Behavior normal.        Thought Content: Thought content normal.        Judgment: Judgment normal.    CBC    Component Value Date/Time   WBC 7.4 03/03/2022 1404   RBC 4.71 03/03/2022 1404   HGB 15.1 03/03/2022 1404   HGB 15.7 12/27/2018 0831  HCT 43.5 03/03/2022 1404   HCT 47.4 (H) 12/27/2018 0831   PLT 219 03/03/2022 1404    PLT 213 12/27/2018 0831   MCV 92.4 03/03/2022 1404   MCV 90 12/27/2018 0831   MCH 32.1 03/03/2022 1404   MCHC 34.7 03/03/2022 1404   RDW 15.0 03/03/2022 1404   RDW 13.1 12/27/2018 0831   LYMPHSABS 488 (L) 03/03/2022 1404   MONOABS 0.7 11/26/2021 1817   EOSABS 0 (L) 03/03/2022 1404   BASOSABS 22 03/03/2022 1404      Latest Ref Rng & Units 03/03/2022    2:04 PM 12/30/2021    2:05 PM 11/29/2021    1:03 AM  BMP  Glucose 65 - 99 mg/dL 500  938  182   BUN 7 - 25 mg/dL 18  16  15    Creatinine 0.60 - 0.95 mg/dL 9.93  7.16  9.67   BUN/Creat Ratio 6 - 22 (calc) SEE NOTE:  SEE NOTE:    Sodium 135 - 146 mmol/L 138  135  138   Potassium 3.5 - 5.3 mmol/L 4.7  4.9  3.5   Chloride 98 - 110 mmol/L 100  99  106   CO2 20 - 32 mmol/L 31  31  27    Calcium 8.6 - 10.4 mg/dL 9.4  9.5  8.2    Chest imaging: CXR 03/17/22 Unchanged left-sided pleural effusion with associated atelectasis/consolidation  CXR 12/06/21 Small left pleural effusion, decreased in size when compared with prior exam. Similar trace right pleural effusion.  CXR 11/25/21 1. Increased LEFT pleural effusion, now moderate to large in size. 2. Probable small RIGHT pleural effusion.  CXR 10/22/21 Stable left effusion. No blunting of the right costophrenic angle.  CXR 09/27/21 Mild-to-moderate cardiomegaly. Again seen is the obscuration of the left cardiac border and hemidiaphragm of pleural effusion with likely superimposed atelectasis/consolidation without significant interval change. There is some blunting of the right CP angle seen of likely small pleural effusion  CXR 08/20/21 Similar appearance of the chest x-ray with left-sided pleural effusion and associated atelectasis/consolidation  CXR 06/25/21 1. Stable small left pleural effusion. 2. No new focal lung infiltrate.  CXR 05/22/21 There is a small to medium size left pleural effusion, not significantly changed since 04/24/2021. Aeration of the left lung is also not  significantly changed. The previously seen small right effusion has resolved. The right lung is now clear. There is no pneumothorax.  CXR 04/22/21 Increasing LEFT effusion and associated airspace disease. Correlate with any signs of infection. Given persistence/worsening underlying pulmonary or bronchial lesion would be difficult to exclude. Correlate with results of prior thoracentesis and consider follow-up chest CT for further evaluation as warranted.   Trace RIGHT effusion with basilar atelectasis.  CXR 03/06/21 Resolved right-sided pleural effusion with persistent opacity on the left, likely a combination of persisting pleural fluid and associated atelectasis/consolidation  CXR 01/30/21 1. Slight interval decrease in size of the right pleural effusion with improved aeration of the right base. 2. No significant interval change of the larger left pleural effusion and adjacent airspace disease.  CT Chest 01/03/21 Moderate left and small right pleural effusions, which is increased on the left and new on the right in comparison to prior CT. Adjacent bibasilar and lingular atelectasis.   Increased, moderate-sized pericardial effusion. Biatrial enlargement.   Mild pulmonary edema.  PFT:     No data to display          Labs: 01/21/21: Quantiferon gold indeterminate  Path:  Echo 01/20/21:  1.  Left ventricular ejection fraction, by estimation, is 60 to 65%. The  left ventricle has normal function. The left ventricle has no regional  wall motion abnormalities.   2. A small pericardial effusion is present. There is no evidence of  cardiac tamponade.  Heart Catheterization:  Assessment & Plan:   Serositis (HCC)  Pleural effusion on left  Rheumatoid factor positive  Discussion: Rebecca Short is an 81 year old woman, never smoker with history of obstructive sleep apnea and atrial fibrillation who returns to pulmonary clinic for follow up of serositis due to rheumatoid  arthritis and left pleural effusion.  Her left pleural effusion and symptoms remain stable since last visit. She was evaluated by CT surgery with negative PET CT scan for the left pleural space. She does not wish to pursue procedural intervention at this time.   She is to taper to 10 mg prednisone daily from 12.5mg . She is to continue 15mg  methorexate weekly per rheumatology. Goal will be to taper off steroids but she may need a low dose of 5-7.5mg  daily along with the methotrexate as her maintenance regimen.  Follow-up in 6 weeks  , MD [facility] Office: [phone]   Current Outpatient Medications:    apixaban (ELIQUIS) 5 MG TABS tablet, TAKE 1 TABLET BY MOUTH TWICE A DAY, Disp: 60 tablet, Rfl: 6   CALCIUM CITRATE-VITAMIN D PO, Take 1 tablet by mouth at bedtime., Disp: , Rfl:    carboxymethylcellulose (REFRESH PLUS) 0.5 % SOLN, Place 1 drop into both eyes 2 (two) times daily., Disp: , Rfl:    EPIPEN 2-PAK 0.3 MG/0.3ML SOAJ injection, Inject 0.3 mg into the muscle once as needed for anaphylaxis (severe allergic reaction)., Disp: , Rfl: 0   famotidine (PEPCID) 10 MG tablet, Take 1-2 tablets by mouth twice daily as needed, Disp: 60 tablet, Rfl: 6   folic acid (FOLVITE) 1 MG tablet, Take 1 tablet (1 mg total) by mouth daily., Disp: 90 tablet, Rfl: 3   furosemide (LASIX) 20 MG tablet, Take 1 tablet (20 mg total) by mouth daily., Disp: 90 tablet, Rfl: 3   methotrexate (RHEUMATREX) 2.5 MG tablet, TAKE 6 TABLETS (15 MG TOTAL) BY MOUTH ONCE A WEEK. CAUTION:CHEMOTHERAPY. PROTECT FROM LIGHT., Disp: 78 tablet, Rfl: 0   metoprolol tartrate (LOPRESSOR) 25 MG tablet, Take 1 tablet (25 mg total) by mouth 2 (two) times daily. May increase to twice daily as tolerated., Disp: 60 tablet, Rfl: 11   Multiple Vitamin (MULTIVITAMIN WITH MINERALS) TABS tablet, Take 1 tablet by mouth every morning., Disp: , Rfl:    Multiple Vitamins-Minerals (PRESERVISION AREDS PO), Take 1  capsule by mouth 2 (two) times daily., Disp: , Rfl:    potassium chloride (KLOR-CON) 20 MEQ packet, USE 1 PACKET ONCE DAILY, Disp: 90 packet, Rfl: 2   predniSONE (DELTASONE) 10 MG tablet, Take 2 tablets (20 mg total) by mouth daily with breakfast. (Patient taking differently: Take 12.5 mg by mouth daily with breakfast.), Disp: 60 tablet, Rfl: 3   predniSONE (DELTASONE) 5 MG tablet, Take 0.5 tablets (2.5 mg total) by mouth daily with breakfast., Disp: 30 tablet, Rfl: 1

## 2022-04-14 NOTE — Patient Instructions (Addendum)
Decrease prednisone dose to 10mg  daily  Continue methotrexate weekly  Follow up in 6 weeks

## 2022-04-14 NOTE — Telephone Encounter (Signed)
Next Visit: 06/03/2022  Last Visit: 03/03/2022  Last Fill: 01/10/2022  DX: Rheumatoid arthritis involving multiple sites with positive rheumatoid factor   Current Dose per office note on 03/03/2022: methotrexate 15 mg p.o. weekly   Labs: 03/03/2022 glucose 105, lymphs abs 488, eosinophils absolute 0  Okay to refill methotrexate?

## 2022-04-27 ENCOUNTER — Other Ambulatory Visit: Payer: Self-pay | Admitting: Interventional Cardiology

## 2022-04-27 DIAGNOSIS — I480 Paroxysmal atrial fibrillation: Secondary | ICD-10-CM

## 2022-04-28 ENCOUNTER — Telehealth: Payer: Self-pay | Admitting: Interventional Cardiology

## 2022-04-28 ENCOUNTER — Telehealth: Payer: Self-pay | Admitting: Pulmonary Disease

## 2022-04-28 MED ORDER — METOPROLOL TARTRATE 25 MG PO TABS: 12.5000 mg | ORAL_TABLET | Freq: Two times a day (BID) | ORAL | 3 refills | Status: DC

## 2022-04-28 NOTE — Telephone Encounter (Signed)
BP reading may fluctuate due to difficulty with hearing the irregular AFib pulse.  COntinue current meds for now.  Stay hydrated.  Most important thing is symptoms.  If she has lightheadedness, then would decrese metoprolol to 12.5 mg BID.  If no sx, continue with the 25 mg BID dose.

## 2022-04-28 NOTE — Telephone Encounter (Signed)
Pt c/o BP issue: STAT if pt c/o blurred vision, one-sided weakness or slurred speech  1. What are your last 5 BP readings?  Yesterday Morning - 76/57 Last Night - 82/52 This Morning- 124/74  2. Are you having any other symptoms (ex. Dizziness, headache, blurred vision, passed out)? Lightheaded  3. What is your BP issue? Pt states that for the last 2 days BP has been running low

## 2022-04-28 NOTE — Telephone Encounter (Signed)
COuld decrease metoprolol to 12.5 mg BID but would like to know HR.  Could also change furosemide 20 mg daily as needed.

## 2022-04-28 NOTE — Telephone Encounter (Signed)
Called and spoke with pt who states she has not been feeling well. States that her BP has been going too low in the mornigns and has been discussing this with her cardiologist.   Pt said the past two days she has not been feeling well. States she has been reducing her prednisone and states a prior time when she began to reduce the prednisone, she had some issues. States that she has had some SOB and wonders if the pleural effusion might be returning. Pt wanted to make an appt so appt made for pt 12/7 with JD. Nothing further needed.

## 2022-04-28 NOTE — Telephone Encounter (Signed)
Prescription refill request for Eliquis received. Indication:afib Last office visit:8/23 Scr:0.7 Age: 81 Weight:75.1  kg  Prescription refilled

## 2022-04-28 NOTE — Telephone Encounter (Signed)
Per pt B/P has been running low this am was 94/69 and after drinking water and laying down B/P  increased to 108/74 Per pt was told she was dehydrated in past and could increase fluids and B/P would improve but this has not happened with this episode has had low B/P multiple days in a row .Will forward to Dr Eldridge Dace for review and recommendations /cy

## 2022-04-28 NOTE — Telephone Encounter (Signed)
I spoke with patient and gave her information from Dr Eldridge Dace.  She does feel lightheaded when her BP is low.  She states she has had to decrease lopressor in the past due to low BP readings.  Patient will decrease lopressor to 12.5 mg twice daily and continue to monitor BP/heart rate.

## 2022-04-28 NOTE — Telephone Encounter (Signed)
I spoke with patient.  She reports heart rate was 86 this AM. Yesterday was 78 and 89.  Usually running 78-99 recently.  Her friend rechecked her blood pressure later today and it was 120/82 and heart rate was 88. Low BP readings just started in the last 2 days.  Patient reports she is trying to stay well hydrated.  She reports sometimes she just doesn't feel good when she starts having issues with pleural effusion.  She feels shortness of breath is a little worse recently.  Her prednisone dose is being reduced.  She reports lasix is for her pleural effusion.  She is reaching out to pulmonary regarding a possible chest X ray.  She is having some swelling in her feet and ankles.  Will forward to Dr Eldridge Dace for recommendations regarding BP

## 2022-05-01 ENCOUNTER — Encounter: Payer: Self-pay | Admitting: Pulmonary Disease

## 2022-05-01 ENCOUNTER — Ambulatory Visit (INDEPENDENT_AMBULATORY_CARE_PROVIDER_SITE_OTHER): Payer: Medicare Other | Admitting: Pulmonary Disease

## 2022-05-01 ENCOUNTER — Ambulatory Visit (INDEPENDENT_AMBULATORY_CARE_PROVIDER_SITE_OTHER): Payer: Medicare Other

## 2022-05-01 VITALS — BP 122/78 | HR 108 | Temp 97.9°F | Ht 62.0 in | Wt 165.6 lb

## 2022-05-01 DIAGNOSIS — J90 Pleural effusion, not elsewhere classified: Secondary | ICD-10-CM

## 2022-05-01 DIAGNOSIS — R0602 Shortness of breath: Secondary | ICD-10-CM | POA: Diagnosis not present

## 2022-05-01 DIAGNOSIS — K658 Other peritonitis: Secondary | ICD-10-CM | POA: Diagnosis not present

## 2022-05-01 LAB — COMPREHENSIVE METABOLIC PANEL
ALT: 24 U/L (ref 0–35)
AST: 26 U/L (ref 0–37)
Albumin: 3.7 g/dL (ref 3.5–5.2)
Alkaline Phosphatase: 51 U/L (ref 39–117)
BUN: 12 mg/dL (ref 6–23)
CO2: 33 mEq/L — ABNORMAL HIGH (ref 19–32)
Calcium: 9.1 mg/dL (ref 8.4–10.5)
Chloride: 95 mEq/L — ABNORMAL LOW (ref 96–112)
Creatinine, Ser: 0.73 mg/dL (ref 0.40–1.20)
GFR: 76.7 mL/min (ref >60.00)
Glucose, Bld: 111 mg/dL — ABNORMAL HIGH (ref 70–99)
Potassium: 4.2 mEq/L (ref 3.5–5.1)
Sodium: 133 mEq/L — ABNORMAL LOW (ref 135–145)
Total Bilirubin: 1 mg/dL (ref 0.2–1.2)
Total Protein: 6.2 g/dL (ref 6.0–8.3)

## 2022-05-01 LAB — CBC WITH DIFFERENTIAL/PLATELET
Basophils Absolute: 0 10*3/uL (ref 0.0–0.1)
Basophils Relative: 0.4 % (ref 0.0–3.0)
Eosinophils Absolute: 0 10*3/uL (ref 0.0–0.7)
Eosinophils Relative: 0.2 % (ref 0.0–5.0)
HCT: 42.8 % (ref 36.0–46.0)
Hemoglobin: 14.3 g/dL (ref 12.0–15.0)
Lymphocytes Relative: 4.3 % — ABNORMAL LOW (ref 12.0–46.0)
Lymphs Abs: 0.4 10*3/uL — ABNORMAL LOW (ref 0.7–4.0)
MCHC: 33.5 g/dL (ref 30.0–36.0)
MCV: 94.5 fl (ref 78.0–100.0)
Monocytes Absolute: 0.7 10*3/uL (ref 0.1–1.0)
Monocytes Relative: 7 % (ref 3.0–12.0)
Neutro Abs: 8.9 10*3/uL — ABNORMAL HIGH (ref 1.4–7.7)
Neutrophils Relative %: 88.1 % — ABNORMAL HIGH (ref 43.0–77.0)
Platelets: 212 10*3/uL (ref 150.0–400.0)
RBC: 4.52 Mil/uL (ref 3.87–5.11)
RDW: 17.7 % — ABNORMAL HIGH (ref 11.5–15.5)
WBC: 10.1 10*3/uL (ref 4.0–10.5)

## 2022-05-01 LAB — BRAIN NATRIURETIC PEPTIDE: Pro B Natriuretic peptide (BNP): 164 pg/mL — ABNORMAL HIGH (ref 0.0–100.0)

## 2022-05-01 LAB — C-REACTIVE PROTEIN: CRP: 10.2 mg/dL (ref 0.5–20.0)

## 2022-05-01 LAB — SEDIMENTATION RATE: Sed Rate: 8 mm/hr (ref 0–30)

## 2022-05-01 NOTE — Progress Notes (Signed)
Synopsis: Referred in October 2022 for hospital follow up for pleural effusions  Subjective:   PATIENT ID: Rebecca Short GENDER: female DOB: 1941/03/07, MRN: [id number]  HPI  Chief Complaint  Patient presents with   Acute Visit    SOB with exertion x 4 days.  Patient "does not feel good".   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   She was tapered to 34m daily at last visit on 11/20. She reports not feeling well since Monday with increased dyspnea, reduced appetite and changes in her blood pressure. She was not able to eat her cereal on Monday due to lack of appetite. She reports low BP readings with sBP in the 80s. Her metoprolol dose was decreased in half with improvement in her blood pressure but her baseline HR is 90s-100s. Her appetite has since improved. She does report lower extremity edema, right > left. Denies fevers, chills, sweats, nausea or vomiting.   OV 04/14/22 She was tapered to 12.557mof prednisone at last visit. She has been doing well since last visit. She continues methotrexate weekly.  OV 03/17/22 She was seen by Dr. RiBenjamine Mola0/9/23. She has continued on methotrexate 1581RReekly with folic acid. She continues ton 1526mf prednisone daily.  She has been feeling fine since last visit. Repeat Chest x-ray shows persistent left pleural effusion.   OV 01/21/22 She started methotrexate therapy 7/31 and is currently on 61m11AFekly with folic acid 1mg19mily. She has continued on 61mg80mdnisone daily. Recommended 2 months of methotrexate treatment prior to tapering of her steroids.  She was evalauted by CT surgery, PET scan is negative. She was offered VATs with mechanical pleuredesis or pleurX catheter placement but has opted not to pursue these options at this time. Chest  radiograph 8/11 shows mild increase in left pleural effusion.  Her symptoms remain stable at this time on the above regimen.   She expressed frustration with her health and not feeling completely better after the last year and she expresses concern with the immunosuppression side effects.   OV 12/06/21 She was tapered down to 10mg 60mrednisone daily on 6/12. She reported  progressive fatigue and shortness of breath over week on 7/3. Chest x-ray showed an increase in her left pleural effusion as well as return of a small right pleural effusion. She was admitted 7/4 and left pigtail chest tube was placed on 7/5. She drained a total of 800mL o68muid with minimal drainage there after. CT chest scan after chest tube placement showed LLL infiltrate vs atelecatsis and resolution of the effusion on the left. There was no need for pleural lytic therapy and her lung expanded appropriately. The effusion was again exudative. Cytology was negative for malignancy.  Her prednisone was increased back to 20mg da13man she continued on azathioprine 100mg dai35mShe is feeling much better since before the hospital.  Dr. Rice of [facility]ola[facility] was notified. We discussed transition to methotrexate from azathioprine.   Her pleural fluid ADA was not elevated and the AFB smear on pleural fluid is negative. Quantiferon gold is negative.   Chest radiograph today shows return of left pleural effusion, but not as significant as her 7/3 chest x-ray. Right effusion has decreased or resolved.   OV 09/3021 She was seen by Katie CobRoxan Diesel  09/27/21 for acute visit due to worsening joint pains and concern for worsening of her pleural effusion. Chest radiograph was overall stable compared to March. It was recommended she continue on the scheduled prednisone taper.   Since that visit she has not had further issues with joint pains or return of dyspnea. She overall feels well. She is currently taking 65m prednisone daily and has  stopped atovaquone antibiotic.  She complains of ear fullness and having trouble hearing and is scheduled to see ENT next month.   OV 09/13/21 She remains on slow prednisone taper and is down to 250mdaily along with atovaquone prophylaxis. She saw Dr. RiBenjamine Molaf rheumatology on 4/17 with no changes to her regimen.   She continues to feel well and continues her weekly exercise and strength regimen. She feels her stamina continues to improve.  OV 08/20/21 She continues to do well since last visit and her stamina is increasing and her balance is getting better. She is getting out to walk in the local parks. She is currently taking 2544mrednisone daily and atovaquone daily.   OV 07/23/21 She continues on imuran therapy 100m53mily which has been increased from 50mg72mly by Dr. Rice Benjamine Molarheumatologist, note from 07/09/21 reviewed. She has started her prednisone taper and is currently taking 30mg 19my. She remains on atovaquone prophylaxis. 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  She has done well since last visit. No increase in shortness of breath. She continues to progress with her physical activity.  OV 06/25/21 She has been doing well since last visit. She was started on imuran per Dr. Rice aBenjamine Molahe has follow up with him on 07/09/21. She remains on 40mg o16mednisone daily. She is working with home PT with good results. She reports her daily activities are increasing as she is now able to vacuum 2 rooms in her home compared to 1 previously.   Chest radiograph today shows chronic left pleural effusion, stable from last month.  OV 05/22/21 She was evaluated by Dr. 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 Anti-smooth muscle anitbody is elevated. Cryoglobulins are negative.   Discussed pleural fluid studies further with Dr. KashikaVic Rippershowed mixed granulocytes and histioctyes along with  some multinucleated giant cells. A definite diagnosis can't be placed on these findings, but he did raise concern for rheumatoid arthritis.   These upates were shared with the patient, her husband and friend.   Chest x-ray today shows persistent left pleural effusion and clear right lung.   Patient is feeling much better after increasing her prednisone back to 40mg da88m She remains on prophylactic antibiotics. Her shoulder pain has also resolved with the prednisone.   She continues to work with PT each week and feels her conditioning in improving.  OV 04/24/21 She was seen in acute visit on 11/28 by Beth WalDerl Barrow increasing shortness of breath and pleuritic chest pain. She had tapered down to 5mg of p74mnisone at this time. She had repeat thoracentesis on 04/08/21 based on CT chest from 04/22/21 which continued to show persistent left pleural effusion. Post-thoracentesis x-ray showed left pleural effusion and small right pleural effusion. Chest x-ray on 11/28 shows increased left pleural effusion.   Patient complains of increasing shortness of breath, bilateral shoulder pain and increasing left-sided discomfort.  Labs from visit on 11/16 showed rheumatoid factor I30 previously 57.2 on 01/21/2021.  P ANCA titer was 1:80 on 04/10/2021.  The following labs were negative on 04/10/2021: ANA, anti-CCP, dsDNA antibody and previously negative for SSA and SSB and antihistone antibodies.  OV 03/06/21 She was admitted 8/27 to 9/7 for pericardial effusion and bilateral pleural effusions. She is status post pericardial drain and multiple thoracenteses. The pleural fluid studies were consistent with exudative process which were initially neutrophil predominant that later became lymphocyte predominant. Cytology was negative for malignancy but showed reactive mesothelial cells.  She is being treated for serositis with prednisone taper and colchicine for the pericardial effusion. She has been on 24m of  prednisone over the past month. She has been on pneumocystis prophylaxis with atovaquone, she developed skin rash with bactrim. Her inflammatory workup was rather unrevealing as ANA, CCP anti-histone ab and SSA/SSB were negative. Her rheumatoid factor was elevated at 57.2.   She is feeling significantly better since discharge is resuming her normal activity and is quite satisfied with her recovering thus far.   Chest radiograph today shows opacities at the left lung base. The right lung is significantly improved since prior study with resolution of right pleural effusion.  Past Medical History:  Diagnosis Date   A-fib ([facility]    Allergy    Atrial fibrillation (HCC)    FH: cholecystectomy 1997   Macular degeneration    OSA (obstructive sleep apnea)    mild with total AHI 7.25/hr and severe during REM sleep at 35/hr now on CPAP at 6cm H2O     Family History  Problem Relation Age of Onset   Osteoporosis Mother    Macular degeneration Mother    Heart Problems Father    Stroke Father    Diabetes Brother    Kidney failure Brother    Cancer Maternal Grandmother    Dementia Paternal Grandmother    Suicidality Other    Heart attack Neg Hx      Social History   Socioeconomic History   Marital status: Married    Spouse name: Not on file   Number of children: Not on file   Years of education: Not on file   Highest education level: Not on file  Occupational History   Not on file  Tobacco Use   Smoking status: Never   Smokeless tobacco: Never  Vaping Use   Vaping Use: Never used  Substance and Sexual Activity   Alcohol use: Never   Drug use: Never   Sexual activity: Not on file  Other Topics Concern   Not on file  Social History Narrative   ** Merged History Encounter **       Social Determinants of Health   Financial Resource Strain: Not on file  Food Insecurity: Not on file  Transportation Needs: Not on file  Physical Activity: Not on file  Stress: Not on file  Social  Connections: Not on file  Intimate Partner Violence: Not on file     Allergies  Allergen Reactions   Erythromycin Other (See Comments)    Other reaction(s): burning   Sulfa Antibiotics     Other reaction(s): Unknown   Azithromycin Other (See Comments)    Pain in arm with IV infusion    Condrolite [Glucosamine-Chondroitin-Msm] Rash   Glucosamine Rash    Other reaction(s): rash Other reaction(s): rash   Sulfamethoxazole-Trimethoprim Rash    Other reaction(s): rash Other reaction(s): rash     Outpatient Medications Prior to Visit  Medication Sig Dispense Refill   CALCIUM CITRATE-VITAMIN D PO Take 1 tablet by mouth at bedtime.  carboxymethylcellulose (REFRESH PLUS) 0.5 % SOLN Place 1 drop into both eyes 2 (two) times daily.     ELIQUIS 5 MG TABS tablet TAKE 1 TABLET BY MOUTH TWICE A DAY 60 tablet 6   EPIPEN 2-PAK 0.3 MG/0.3ML SOAJ injection Inject 0.3 mg into the muscle once as needed for anaphylaxis (severe allergic reaction).  0   famotidine (PEPCID) 10 MG tablet Take 1-2 tablets by mouth twice daily as needed 60 tablet 6   folic acid (FOLVITE) 1 MG tablet Take 1 tablet (1 mg total) by mouth daily. 90 tablet 3   furosemide (LASIX) 20 MG tablet Take 1 tablet (20 mg total) by mouth daily. 90 tablet 3   methotrexate (RHEUMATREX) 2.5 MG tablet TAKE 6 TABLETS (15 MG TOTAL) BY MOUTH ONCE A WEEK. CAUTION:CHEMOTHERAPY. PROTECT FROM LIGHT. 78 tablet 0   metoprolol tartrate (LOPRESSOR) 25 MG tablet Take 0.5 tablets (12.5 mg total) by mouth 2 (two) times daily. 90 tablet 3   Multiple Vitamin (MULTIVITAMIN WITH MINERALS) TABS tablet Take 1 tablet by mouth every morning.     Multiple Vitamins-Minerals (PRESERVISION AREDS PO) Take 1 capsule by mouth 2 (two) times daily.     potassium chloride (KLOR-CON) 20 MEQ packet USE 1 PACKET ONCE DAILY 90 packet 2   predniSONE (DELTASONE) 10 MG tablet Take 2 tablets (20 mg total) by mouth daily with breakfast. (Patient taking differently: Take 10 mg by  mouth daily with breakfast.) 60 tablet 3   predniSONE (DELTASONE) 5 MG tablet Take 0.5 tablets (2.5 mg total) by mouth daily with breakfast. (Patient not taking: Reported on 05/01/2022) 30 tablet 1   No facility-administered medications prior to visit.   Review of Systems  Constitutional:  Negative for chills, fever, malaise/fatigue and weight loss.  HENT:  Negative for congestion, sinus pain and sore throat.   Eyes: Negative.   Respiratory:  Positive for shortness of breath (with exertion). Negative for cough, hemoptysis, sputum production and wheezing.   Cardiovascular:  Positive for leg swelling. Negative for chest pain, palpitations, orthopnea and claudication.  Gastrointestinal:  Negative for abdominal pain, heartburn, nausea and vomiting.  Genitourinary: Negative.   Musculoskeletal:  Negative for joint pain and myalgias.  Skin:  Negative for rash.  Neurological:  Negative for weakness.  Endo/Heme/Allergies:  Bruises/bleeds easily.  Psychiatric/Behavioral: Negative.     Objective:   Vitals:   05/01/22 0847  BP: 122/78  Pulse: (!) 108  Temp: 97.9 F (36.6 C)  TempSrc: Oral  SpO2: 98%  Weight: 165 lb 9.6 oz (75.1 kg)  Height: _0  (1.575 m)   Physical Exam Constitutional:      General: She is not in acute distress.    Appearance: She is not ill-appearing.     Comments: Tired appearing  HENT:     Head: Normocephalic and atraumatic.  Cardiovascular:     Rate and Rhythm: Normal rate and regular rhythm.     Pulses: Normal pulses.     Heart sounds: Normal heart sounds. No murmur heard. Pulmonary:     Effort: Pulmonary effort is normal.     Breath sounds: Examination of the left-middle field reveals decreased breath sounds. Examination of the left-lower field reveals decreased breath sounds. Decreased breath sounds present. No wheezing, rhonchi or rales.  Musculoskeletal:     Right lower leg: No edema.     Left lower leg: No edema.  Skin:    General: Skin is warm and  dry.     Findings: Bruising (on hands b/l)  present.  Neurological:     General: No focal deficit present.     Mental Status: She is alert.  Psychiatric:        Mood and Affect: Mood normal.        Behavior: Behavior normal.        Thought Content: Thought content normal.        Judgment: Judgment normal.    CBC    Component Value Date/Time   WBC 7.4 03/03/2022 1404   RBC 4.71 03/03/2022 1404   HGB 15.1 03/03/2022 1404   HGB 15.7 12/27/2018 0831   HCT 43.5 03/03/2022 1404   HCT 47.4 (H) 12/27/2018 0831   PLT 219 03/03/2022 1404   PLT 213 12/27/2018 0831   MCV 92.4 03/03/2022 1404   MCV 90 12/27/2018 0831   MCH 32.1 03/03/2022 1404   MCHC 34.7 03/03/2022 1404   RDW 15.0 03/03/2022 1404   RDW 13.1 12/27/2018 0831   LYMPHSABS 488 (L) 03/03/2022 1404   MONOABS 0.7 11/26/2021 1817   EOSABS 0 (L) 03/03/2022 1404   BASOSABS 22 03/03/2022 1404      Latest Ref Rng & Units 03/03/2022    2:04 PM 12/30/2021    2:05 PM 11/29/2021    1:03 AM  BMP  Glucose 65 - 99 mg/dL 105  108  178   BUN 7 - 25 mg/dL _0 Creatinine 0.60 - 0.95 mg/dL 0.74  0.86  0.73   BUN/Creat Ratio 6 - 22 (calc) SEE NOTE:  SEE NOTE:    Sodium 135 - 146 mmol/L 138  135  138   Potassium 3.5 - 5.3 mmol/L 4.7  4.9  3.5   Chloride 98 - 110 mmol/L 100  99  106   CO2 20 - 32 mmol/L _1 Calcium 8.6 - 10.4 mg/dL 9.4  9.5  8.2    Chest imaging: CXR 03/17/22 Unchanged left-sided pleural effusion with associated atelectasis/consolidation  CXR 12/06/21 Small left pleural effusion, decreased in size when compared with prior exam. Similar trace right pleural effusion.  CXR 11/25/21 1. Increased LEFT pleural effusion, now moderate to large in size. 2. Probable small RIGHT pleural effusion.  CXR 10/22/21 Stable left effusion. No blunting of the right costophrenic angle.  CXR 09/27/21 Mild-to-moderate cardiomegaly. Again seen is the obscuration of the left cardiac border and hemidiaphragm of pleural  effusion with likely superimposed atelectasis/consolidation without significant interval change. There is some blunting of the right CP angle seen of likely small pleural effusion  CXR 08/20/21 Similar appearance of the chest x-ray with left-sided pleural effusion and associated atelectasis/consolidation  CXR 06/25/21 1. Stable small left pleural effusion. 2. No new focal lung infiltrate.  CXR 05/22/21 There is a small to medium size left pleural effusion, not significantly changed since 04/24/2021. Aeration of the left lung is also not significantly changed. The previously seen small right effusion has resolved. The right lung is now clear. There is no pneumothorax.  CXR 04/22/21 Increasing LEFT effusion and associated airspace disease. Correlate with any signs of infection. Given persistence/worsening underlying pulmonary or bronchial lesion would be difficult to exclude. Correlate with results of prior thoracentesis and consider follow-up chest CT for further evaluation as warranted.   Trace RIGHT effusion with basilar atelectasis.  CXR 03/06/21 Resolved right-sided pleural effusion with persistent opacity on the left, likely a combination of persisting pleural fluid and associated atelectasis/consolidation  CXR 01/30/21 1. Slight interval decrease in size of  the right pleural effusion with improved aeration of the right base. 2. No significant interval change of the larger left pleural effusion and adjacent airspace disease.  CT Chest 01/03/21 Moderate left and small right pleural effusions, which is increased on the left and new on the right in comparison to prior CT. Adjacent bibasilar and lingular atelectasis.   Increased, moderate-sized pericardial effusion. Biatrial enlargement.   Mild pulmonary edema.  PFT:     No data to display          Labs: 01/21/21: Quantiferon gold indeterminate  Path:  Echo 01/20/21:  1. Left ventricular ejection fraction, by  estimation, is 60 to 65%. The  left ventricle has normal function. The left ventricle has no regional  wall motion abnormalities.   2. A small pericardial effusion is present. There is no evidence of  cardiac tamponade.  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  She has not been feeling well since Monday with increased dyspnea and lower extremity edema. Her hypotension has improved with reduction of metoprolol dosing. Her baseline HR is now higher in 90s to low 100s. On exam, her pleural effusion does not appear to have increased but will check chest x-ray today. Will check CMP, CBC with diff, CRP, ESR and BNP.   She does not appear to be acutely infected. Possible side effect of methotrexate vs heart failure.   Continue current prednisone at 58m daily.   Will follow up with patient once x-ray and labs return.   JFreda Jackson MD [facility] Office: [phone]  Current Outpatient Medications:    CALCIUM CITRATE-VITAMIN D PO, Take 1 tablet by mouth at bedtime., Disp: , Rfl:    carboxymethylcellulose (REFRESH PLUS) 0.5 % SOLN, Place 1 drop into both eyes 2 (two) times daily., Disp: , Rfl:    ELIQUIS 5 MG TABS tablet, TAKE 1 TABLET BY MOUTH TWICE A DAY, Disp: 60 tablet, Rfl: 6   EPIPEN 2-PAK 0.3 MG/0.3ML SOAJ injection, Inject 0.3 mg into the muscle once as needed for anaphylaxis (severe allergic reaction)., Disp: , Rfl: 0   famotidine (PEPCID) 10 MG tablet, Take 1-2 tablets by mouth twice daily as needed, Disp: 60 tablet, Rfl: 6   folic acid (FOLVITE) 1 MG tablet, Take 1 tablet (1 mg total) by mouth daily., Disp: 90 tablet, Rfl: 3   furosemide  (LASIX) 20 MG tablet, Take 1 tablet (20 mg total) by mouth daily., Disp: 90 tablet, Rfl: 3   methotrexate (RHEUMATREX) 2.5 MG tablet, TAKE 6 TABLETS (15 MG TOTAL) BY MOUTH ONCE A WEEK. CAUTION:CHEMOTHERAPY. PROTECT FROM LIGHT., Disp: 78 tablet, Rfl: 0   metoprolol tartrate (LOPRESSOR) 25 MG tablet, Take 0.5 tablets (12.5 mg total) by mouth 2 (two) times daily., Disp: 90 tablet, Rfl: 3   Multiple Vitamin (MULTIVITAMIN WITH MINERALS) TABS tablet, Take 1 tablet by mouth every morning., Disp: , Rfl:    Multiple Vitamins-Minerals (PRESERVISION AREDS PO), Take 1 capsule by mouth 2 (two) times daily., Disp: , Rfl:    potassium chloride (KLOR-CON) 20 MEQ packet, USE 1 PACKET ONCE DAILY, Disp: 90 packet, Rfl: 2   predniSONE (DELTASONE) 10 MG tablet, Take 2 tablets (20 mg total)  by mouth daily with breakfast. (Patient taking differently: Take 10 mg by mouth daily with breakfast.), Disp: 60 tablet, Rfl: 3   predniSONE (DELTASONE) 5 MG tablet, Take 0.5 tablets (2.5 mg total) by mouth daily with breakfast. (Patient not taking: Reported on 05/01/2022), Disp: 30 tablet, Rfl: 1

## 2022-05-01 NOTE — Patient Instructions (Addendum)
We will check labs and a chest x-ray today  We will give you a call once we have these results collected  Follow up as scheduled

## 2022-05-02 ENCOUNTER — Telehealth: Payer: Self-pay | Admitting: Pulmonary Disease

## 2022-05-02 DIAGNOSIS — J189 Pneumonia, unspecified organism: Secondary | ICD-10-CM

## 2022-05-02 MED ORDER — AZITHROMYCIN 250 MG PO TABS: ORAL_TABLET | ORAL | 0 refills | Status: DC

## 2022-05-02 NOTE — Telephone Encounter (Signed)
I spoke with patient.  Her chest x-ray shows stable left pleural effusion. Possible right medial lower lobe infiltrate. Will follow up radiology read. CBC shows left shift.  Will send in Zpak to treat for possible infection.  Melody Comas, MD [facility] Office: [phone]   See Amion for personal pager PCCM on call pager [phone] until 7pm. Please call Elink 7p-7a. [phone]

## 2022-05-02 NOTE — Telephone Encounter (Signed)
Called patient and she states that she would like the results of her chest xray and lab work that were done yesterday.  Please advise sir

## 2022-05-06 ENCOUNTER — Telehealth: Payer: Self-pay | Admitting: Interventional Cardiology

## 2022-05-06 NOTE — Telephone Encounter (Signed)
I spoke with patient.  She reports for a long time she has had swelling in her feet and ankles during the day. Swelling goes down over night. Since Thanksgiving the swelling in her right foot and ankle does not go down over night.  No recent injury to foot/ankle.  She is taking lasix as ordered. I advised her to try and keep legs elevated as much as possible.  Patient reports she still has some low BP readings at times.  Today BP was 107/78, one hour later it was 94/69.  Other readings today were 97/76, 108/76 and 96/73

## 2022-05-06 NOTE — Telephone Encounter (Signed)
1) Patient stated she had a recent chest xray shows abnormal results and she would like Dr. Eldridge Dace to review the results.  Patient would like to know if the increased vascular congestion related to more problems with her heart.  Patient request RN Dennie Bible return her call if possible.  2) Pt c/o swelling: STAT is pt has developed SOB within 24 hours  If swelling, where is the swelling located?   Feet and ankle  How much weight have you gained and in what time span?   No  Have you gained 3 pounds in a day or 5 pounds in a week? No  Do you have a log of your daily weights (if so, list)?   Yes  Are you currently taking a fluid pill?   Yes  Are you currently SOB?  Was SOB last week due to infection  Have you traveled recently?  No  Patient has been having swelling mostly in her right ankle and foot which has not been going down.

## 2022-05-07 NOTE — Telephone Encounter (Signed)
Focus on leg elevation and can try compression stockings 10-20 mm Hg, up to the knee.

## 2022-05-09 NOTE — Telephone Encounter (Signed)
I spoke with patient and gave her information from Dr Eldridge Dace.  Patient reports she has seen PCP and he has evaluated her swelling and also recommended compression stockings. Patient states she will try compression stockings now.

## 2022-05-14 ENCOUNTER — Ambulatory Visit: Payer: Medicare Other | Attending: Cardiology | Admitting: Cardiology

## 2022-05-14 ENCOUNTER — Encounter: Payer: Self-pay | Admitting: Cardiology

## 2022-05-14 ENCOUNTER — Other Ambulatory Visit: Payer: Self-pay

## 2022-05-14 VITALS — BP 112/62 | HR 86 | Ht 62.0 in | Wt 165.8 lb

## 2022-05-14 DIAGNOSIS — G4733 Obstructive sleep apnea (adult) (pediatric): Secondary | ICD-10-CM | POA: Insufficient documentation

## 2022-05-14 DIAGNOSIS — I4821 Permanent atrial fibrillation: Secondary | ICD-10-CM | POA: Insufficient documentation

## 2022-05-14 NOTE — Progress Notes (Signed)
Date:  05/14/2022   ID:  Bridney, Palombi 1941/04/07, MRN [id number]  PCP:  Lawerance Cruel, MD Cardiologist: Casandra Doffing, MD  Sleep Medicine:  Fransico Him, MD Electrophysiologist:  None   Chief Complaint:  OSA  History of Present Illness:    Rebecca Short is a 81 y.o. 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   She is doing well with her PAP device and thinks that she has gotten used to it.  She tolerates the mask and feels the pressure is adequate.  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  She denies any significant mouth or nasal dryness or nasal congestion.  She does not think that he snores.    Prior CV studies:   The following studies were reviewed today:  PAP compliance download  Past Medical History:  Diagnosis Date   A-fib ([place])    Allergy    Atrial fibrillation ([facility])    FH: cholecystectomy 1997   Macular degeneration    OSA (obstructive sleep apnea)    mild with total AHI 7.25/hr and severe during REM sleep at 35/hr now on CPAP at 6cm H2O   Past Surgical History:  Procedure Laterality Date   CHEST TUBE INSERTION Left 11/27/2021   Procedure: CHEST TUBE INSERTION;  Surgeon: Freddi Starr, MD;  Location: [facility] ENDOSCOPY;  Service: Pulmonary;  Laterality: Left;  Prefer AM slot, thanks   CHOLECYSTECTOMY     PERICARDIOCENTESIS N/A 01/07/2021   Procedure: PERICARDIOCENTESIS;  Surgeon: Nelva Bush, MD;  Location: [facility];  Service: Cardiovascular;  Laterality: N/A;   REPLACEMENT TOTAL KNEE BILATERAL     THORACENTESIS N/A 01/25/2021   Procedure: THORACENTESIS;  Surgeon: Freddi Starr, MD;  Location: [facility] ENDOSCOPY;  Service: Pulmonary;  Laterality: N/A;   THORACENTESIS N/A 04/08/2021   Procedure: THORACENTESIS;  Surgeon: Freddi Starr, MD;   Location: [facility] ENDOSCOPY;  Service: Pulmonary;  Laterality: N/A;   THORACENTESIS N/A 04/24/2021   Procedure: THORACENTESIS;  Surgeon: Juanito Doom, MD;  Location: [place];  Service: Cardiopulmonary;  Laterality: N/A;     Current Meds  Medication Sig   CALCIUM CITRATE-VITAMIN D PO Take 1 tablet by mouth at bedtime.   carboxymethylcellulose (REFRESH PLUS) 0.5 % SOLN Place 1 drop into both eyes 2 (two) times daily.   ELIQUIS 5 MG TABS tablet TAKE 1 TABLET BY MOUTH TWICE A DAY   EPIPEN 2-PAK 0.3 MG/0.3ML SOAJ injection Inject 0.3 mg into the muscle once as needed for anaphylaxis (severe allergic reaction).   famotidine (PEPCID) 10 MG tablet Take 1-2 tablets by mouth twice daily as needed   folic acid (FOLVITE) 1 MG tablet Take 1 tablet (1 mg total) by mouth daily.   furosemide (LASIX) 20 MG tablet Take 1 tablet (20 mg total) by mouth daily.   methotrexate (RHEUMATREX) 2.5 MG tablet TAKE 6 TABLETS (15 MG TOTAL) BY MOUTH ONCE A WEEK. CAUTION:CHEMOTHERAPY. PROTECT FROM LIGHT.   metoprolol tartrate (LOPRESSOR) 25 MG tablet Take 0.5 tablets (12.5 mg total) by mouth 2 (two) times daily.   Multiple Vitamin (MULTIVITAMIN WITH MINERALS) TABS tablet Take 1 tablet by mouth every morning.   Multiple Vitamins-Minerals (PRESERVISION AREDS PO) Take 1 capsule by mouth 2 (two) times  daily.   potassium chloride (KLOR-CON) 20 MEQ packet USE 1 PACKET ONCE DAILY   predniSONE (DELTASONE) 10 MG tablet Take 2 tablets (20 mg total) by mouth daily with breakfast. (Patient taking differently: Take 10 mg by mouth daily with breakfast.)     Allergies:   Erythromycin, Sulfa antibiotics, Azithromycin, Condrolite [glucosamine-chondroitin-msm], Glucosamine, and Sulfamethoxazole-trimethoprim   Social History   Tobacco Use   Smoking status: Never   Smokeless tobacco: Never  Vaping Use   Vaping Use: Never used  Substance Use Topics   Alcohol use: Never   Drug use: Never     Family Hx: The patient's family  history includes Cancer in her maternal grandmother; Dementia in her paternal grandmother; Diabetes in her brother; Heart Problems in her father; Kidney failure in her brother; Macular degeneration in her mother; Osteoporosis in her mother; Stroke in her father; Suicidality in an other family member. There is no history of Heart attack.  ROS:   Please see the history of present illness.     All other systems reviewed and are negative.   Labs/Other Tests and Data Reviewed:    Recent Labs: 11/26/2021: B Natriuretic Peptide 235.7; Magnesium 2.2 05/01/2022: ALT 24; BUN 12; Creatinine, Ser 0.73; Hemoglobin 14.3; Platelets 212.0; Potassium 4.2; Pro B Natriuretic peptide (BNP) 164.0; Sodium 133   Recent Lipid Panel Lab Results  Component Value Date/Time   CHOL 145 08/19/2017 08:16 AM   TRIG 101 08/19/2017 08:16 AM   HDL 47 08/19/2017 08:16 AM   CHOLHDL 3.1 08/19/2017 08:16 AM   LDLCALC 78 08/19/2017 08:16 AM    Wt Readings from Last 3 Encounters:  05/14/22 165 lb 12.8 oz (75.2 kg)  05/01/22 165 lb 9.6 oz (75.1 kg)  04/14/22 165 lb 9.6 oz (75.1 kg)     Objective:    Vital Signs:  BP 112/62   Pulse 86   Ht 5\' 2"  (1.575 m)   Wt 165 lb 12.8 oz (75.2 kg)   SpO2 98%   BMI 30.33 kg/m    GEN: Well nourished, well developed in no acute distress HEENT: Normal NECK: No JVD; No carotid bruits LYMPHATICS: No lymphadenopathy CARDIAC: Irregularly irregular, no murmurs, rubs, gallops RESPIRATORY:  Clear to auscultation without rales, wheezing or rhonchi  ABDOMEN: Soft, non-tender, non-distended MUSCULOSKELETAL:  No edema; No deformity  SKIN: Warm and dry NEUROLOGIC:  Alert and oriented x 3 PSYCHIATRIC:  Normal affect  ASSESSMENT & PLAN:    1. OSA - The patient is tolerating PAP therapy well without any problems. The PAP download performed by his DME was personally reviewed and interpreted by me today and showed an AHI of 0.5/hr on 6 cm H2O with 100% compliance in using more than 4 hours  nightly.  The patient has been using and benefiting from PAP use and will continue to benefit from therapy.    2.  Permanent  AF -She is maintaining normal sinus rhythm on exam today and denies any palpitations. -Continue prescription drug management with Eliquis 5 mg twice daily and Lopressor 12.5 mg twice daily with as needed refills    Medication Adjustments/Labs and Tests Ordered: Current medicines are reviewed at length with the patient today.  Concerns regarding medicines are outlined above.  Tests Ordered: No orders of the defined types were placed in this encounter.   Medication Changes: No orders of the defined types were placed in this encounter.    Disposition:  Follow up in 1 year(s)  Signed, , MD  05/14/2022 10:40  AM    [place]

## 2022-05-14 NOTE — Patient Instructions (Signed)
Medication Instructions:  Your physician recommends that you continue on your current medications as directed. Please refer to the Current Medication list given to you today.  *If you need a refill on your cardiac medications before your next appointment, please call your pharmacy*  Lab Work: If you have labs (blood work) drawn today and your tests are completely normal, you will receive your results only by: MyChart Message (if you have MyChart) OR A paper copy in the mail If you have any lab test that is abnormal or we need to change your treatment, we will call you to review the results.  Testing/Procedures: None ordered today.  Follow-Up: At [facility], you and your health needs are our priority.  As part of our continuing mission to provide you with exceptional heart care, we have created designated Provider Care Teams.  These Care Teams include your primary Cardiologist (physician) and Advanced Practice Providers (APPs -  Physician Assistants and Nurse Practitioners) who all work together to provide you with the care you need, when you need it.  We recommend signing up for the patient portal called "MyChart".  Sign up information is provided on this After Visit Summary.  MyChart is used to connect with patients for Virtual Visits (Telemedicine).  Patients are able to view lab/test results, encounter notes, upcoming appointments, etc.  Non-urgent messages can be sent to your provider as well.   To learn more about what you can do with MyChart, go to [email or web address].    Your next appointment:   1 year(s)  The format for your next appointment:   In Person  Provider:   Dr. Turner    Important Information About Sugar       

## 2022-05-22 ENCOUNTER — Telehealth: Payer: Self-pay | Admitting: Interventional Cardiology

## 2022-05-22 NOTE — Telephone Encounter (Signed)
I agree with holding metoprolol.  Monitor BP and HR.  May need to consider using 12.5 mg once a day going forward and dosing when her BP and HR are the highest.

## 2022-05-22 NOTE — Telephone Encounter (Signed)
Patient notified.  She will stop metoprolol for now and check heart rate and BP.  Patient will call office with readings next week.

## 2022-05-22 NOTE — Telephone Encounter (Signed)
I spoke with patient. She reports she checks BP every AM and PM.  Takes metoprolol around 7:30 AM. Today shortly before 8 AM she started feeling clammy and looked pale.  Her neighbor who is a retired Engineer, civil (consulting) checked her BP and initially was unable to hear it.  Eventually got 64/50. About 15 minutes later BP was 89/64 and patient was feeling better. Color improved.  Patient reports range of BP readings.  She has gotten readings of 97/75, 99/62, 104/79, 117/81, 112/80 over last few weeks.  She feels tired after Christmas but no other issues prior to this morning. Heart rate around 76 although it does go up briefly to 120's when walking up stairs to her bonus room. She was going to get compression stockings today but did not go out to get them due to BP this AM. I advised patient to hold lopressor this evening and tomorrow morning and that message would be sent to Dr Eldridge Dace for further recommendations.

## 2022-05-22 NOTE — Telephone Encounter (Signed)
Pt c/o BP issue: STAT if pt c/o blurred vision, one-sided weakness or slurred speech  1. What are your last 5 BP readings?  114/78 7 A.M.  64/50   8 A.M  89/64   8:15 A.M. 105/70 9:01 A.M. 100/64 9:29 A.M. 112/77 10:00 A.M. 100/69 11:30 A.M. 102/68 12:30 P.M.  107/68 1:20 P.M.   2. Are you having any other symptoms (ex. Dizziness, headache, blurred vision, passed out)? Cold and clammy   3. What is your BP issue? Pt states that she started feeling bad this morning and noticed that she couldn't get her BP to come back up. Requesting call back from [place], [place].

## 2022-05-26 ENCOUNTER — Telehealth: Payer: Self-pay | Admitting: Cardiology

## 2022-05-26 NOTE — Telephone Encounter (Signed)
Patient called in reporting that her heart rate has began to run high since stopping her metoprolol 25 mg twice daily.  This was stopped in the setting of low blood pressures.  She reports her most recent blood pressures have been stable.  Now with elevated heart rates.  Advised she could take 12.5 mg of metoprolol now and follow her blood pressure.  If blood pressures remain stable would resume 12.5 mg twice daily as tolerated.  She thanked me for follow-up call and voiced understanding.

## 2022-05-27 NOTE — Telephone Encounter (Signed)
Pt c/o BP issue: STAT if pt c/o blurred vision, one-sided weakness or slurred speech  1. What are your last 5 BP readings?   103/70  HR 73 (this morning at 10:10am after she had something to drink) 59/47  HR 61 (this morning at 10am)  2. Are you having any other symptoms (ex. Dizziness, headache, blurred vision, passed out)?   Dizziness and pain in left eye due to low BP  3. What is your BP issue?    Patient stated she had a episode of hypotension and had her metoprolol tartrate (LOPRESSOR) 25 MG tablet held.  Patient would like a call back to discuss next steps.

## 2022-05-27 NOTE — Telephone Encounter (Signed)
Spent about 23 minutes on phone w/ pt On Prednisone for auto immune disorder, since 12/2020, but trying to wean her off now and just be on arthritis medication.  On 10 mg currently. Called in yesterday and was advised to take 1/2 metoprolol d/t elevated HRs 110s-130s.  She was told to hold it last week d/t hypotension. She took another 1/2 this morning (12.5 mg), and then  the BP started to bottom out again.      9:25 am 86/62, HR 82      9:40 am Her retired Tour manager couldn't get reading   Pale/cool to touch, not feeling well at all      10:00 am 59/47, HR 61      10:10 am 102/69, HR 73. Advised to hold/stop Metoprolol. She would really like to wait and get Dr. Hassell Done advisement as he knows her best.  She understands it may be end of week before advisement by him. But reminded no more Metoprolol.  Aware it may be time to consider another rate controlling medication that may not affect her BP as much as the Metoprolol is currently  (maybe Diltiazem). She will call before end of week if she needs HRs addressed, but will await MD advisement.  Did offer to discuss w/ DOD, but pt prefers to wait if she can. She appreciates the time I took with her to discuss all of this and agreeable to plan for now.

## 2022-05-29 ENCOUNTER — Encounter: Payer: Self-pay | Admitting: Pulmonary Disease

## 2022-05-29 ENCOUNTER — Ambulatory Visit (INDEPENDENT_AMBULATORY_CARE_PROVIDER_SITE_OTHER): Payer: Medicare Other | Admitting: Pulmonary Disease

## 2022-05-29 ENCOUNTER — Ambulatory Visit (INDEPENDENT_AMBULATORY_CARE_PROVIDER_SITE_OTHER): Payer: Medicare Other

## 2022-05-29 VITALS — BP 114/68 | HR 94 | Ht 62.0 in | Wt 167.0 lb

## 2022-05-29 DIAGNOSIS — J90 Pleural effusion, not elsewhere classified: Secondary | ICD-10-CM | POA: Diagnosis not present

## 2022-05-29 MED ORDER — PREDNISONE 5 MG PO TABS: 5.0000 mg | ORAL_TABLET | Freq: Every day | ORAL | 1 refills | Status: DC

## 2022-05-29 NOTE — Progress Notes (Signed)
Synopsis: Referred in October 2022 for hospital follow up for pleural effusions  Subjective:   PATIENT ID: Rebecca Short GENDER: female DOB: 08/11/1940, MRN: [id number]  HPI  Chief Complaint  Patient presents with   Follow-up    4 wk f/u.    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   She was treated with Zpak at last visit for possible lower respiratory infection based on chest radiograph with concern for right medial lower lobe infiltrate.   CRP and ESR were not elevated at last visit. Chest radiograph was stable.  She has not been doing her workout regimen as she has not felt like it. She has had issues with hypotension when taking her metoprolol. This has been held. She has felt like this over the past 2 weeks. Denies fevers, chills or sweats. Her appetite is down. She has noticed right shoulder discomfort intermittently.  OV 05/01/22 She was tapered to 64m daily at last visit on 11/20. She reports not feeling well since Monday with increased dyspnea, reduced appetite and changes in her blood pressure. She was not able to eat her cereal on Monday due to lack of appetite. She reports low BP readings with sBP in the 80s. Her metoprolol dose was decreased in half with improvement in her blood pressure but her baseline HR is 90s-100s. Her appetite has since improved. She does report lower extremity edema, right > left. Denies fevers, chills, sweats, nausea or vomiting.   OV 04/14/22 She was tapered to 12.54mof prednisone at last visit. She has been doing well since last visit. She continues methotrexate weekly.  OV 03/17/22 She was seen by Dr. RiBenjamine Mola0/9/23. She has continued on methotrexate 1509XIeekly with folic acid. She continues ton 1576mf prednisone daily.  She has been  feeling fine since last visit. Repeat Chest x-ray shows persistent left pleural effusion.   OV 01/21/22 She started methotrexate therapy 7/31 and is currently on 62m33ASekly with folic acid 1mg54mily. She has continued on 62mg47mdnisone daily. Recommended 2 months of methotrexate treatment prior to tapering of her steroids.  She was evalauted by CT surgery, PET scan is negative. She was offered VATs with mechanical pleuredesis or pleurX catheter placement but has opted not to pursue these options at this time. Chest radiograph 8/11 shows mild increase in left pleural effusion.  Her symptoms remain stable at this time on the above regimen.   She expressed frustration with her health and not feeling completely better after the last year and she expresses concern with the immunosuppression side effects.   OV 12/06/21 She was tapered down to 10mg 94mrednisone daily on 6/12. She reported  progressive fatigue and shortness of breath over week on 7/3. Chest x-ray showed an increase in her left pleural effusion as well as return of a small right pleural effusion. She was admitted 7/4 and left pigtail chest tube was placed on 7/5. She drained a total of 800mL o24muid with minimal drainage there after. CT chest scan after chest tube placement showed LLL infiltrate vs atelecatsis and resolution of the effusion on the left. There was no need for pleural lytic therapy and her lung expanded appropriately. The effusion was again exudative. Cytology was negative for malignancy.  Her prednisone was increased back to  106m daily an she continued on azathioprine 1015mdaily. She is feeling much better since before the hospital.  Dr. RiBenjamine Molaf rheumatology was notified. We discussed transition to methotrexate from azathioprine.   Her pleural fluid ADA was not elevated and the AFB smear on pleural fluid is negative. Quantiferon gold is negative.   Chest radiograph today shows return of left pleural effusion, but not  as significant as her 7/3 chest x-ray. Right effusion has decreased or resolved.   OV 09/3021 She was seen by KaRoxan DieselNP on 09/27/21 for acute visit due to worsening joint pains and concern for worsening of her pleural effusion. Chest radiograph was overall stable compared to March. It was recommended she continue on the scheduled prednisone taper.   Since that visit she has not had further issues with joint pains or return of dyspnea. She overall feels well. She is currently taking 1510mrednisone daily and has stopped atovaquone antibiotic.  She complains of ear fullness and having trouble hearing and is scheduled to see ENT next month.   OV 09/13/21 She remains on slow prednisone taper and is down to 39m47mily along with atovaquone prophylaxis. She saw Dr. RiceBenjamine Molarheumatology on 4/17 with no changes to her regimen.   She continues to feel well and continues her weekly exercise and strength regimen. She feels her stamina continues to improve.  OV 08/20/21 She continues to do well since last visit and her stamina is increasing and her balance is getting better. She is getting out to walk in the local parks. She is currently taking 25mg81mdnisone daily and atovaquone daily.   OV 07/23/21 She continues on imuran therapy 100mg 71my which has been increased from 50mg d64m by Dr. Rice heBenjamine Molaeumatologist, note from 07/09/21 reviewed. She has started her prednisone taper and is currently taking 30mg da17m She remains on atovaquone prophylaxis. 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  She has done well since last visit. No increase in shortness of breath. She continues to progress with her physical activity.  OV 06/25/21 She has been doing well since last visit. She was started on imuran per Dr. Rice andBenjamine Mola has follow up with him on 07/09/21. She remains on 40mg of 63mnisone daily. She is working with home PT with good results. She  reports her daily activities are increasing as she is now able to vacuum 2 rooms in her home compared to 1 previously.   Chest radiograph today shows chronic left pleural effusion, stable from last month.  OV 05/22/21 She was evaluated by Dr. 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 Anti-smooth muscle anitbody is elevated. Cryoglobulins are negative.   Discussed pleural fluid studies further with Dr. Kashikar Vic Ripperowed mixed granulocytes and histioctyes along with some multinucleated giant cells. A definite diagnosis can't be placed on these findings, but he did raise concern for rheumatoid arthritis.   These upates were shared with the patient, her husband and friend.   Chest x-ray today shows persistent left pleural effusion and clear right lung.   Patient is feeling much better after increasing her prednisone back to 40mg dail30mhe remains on prophylactic antibiotics. Her shoulder pain has also resolved with the prednisone.   She continues to work with PT each week and feels her conditioning in improving.  OV 04/24/21 She was seen in acute visit on 11/28 by Beth WalshDerl Barrowncreasing shortness of breath and  pleuritic chest pain. She had tapered down to 61m of prednisone at this time. She had repeat thoracentesis on 04/08/21 based on CT chest from 04/22/21 which continued to show persistent left pleural effusion. Post-thoracentesis x-ray showed left pleural effusion and small right pleural effusion. Chest x-ray on 11/28 shows increased left pleural effusion.   Patient complains of increasing shortness of breath, bilateral shoulder pain and increasing left-sided discomfort.  Labs from visit on 11/16 showed rheumatoid factor I30 previously 57.2 on 01/21/2021.  P ANCA titer was 1:80 on 04/10/2021.  The following labs were negative on 04/10/2021: ANA, anti-CCP, dsDNA antibody and previously negative for SSA and SSB and antihistone  antibodies.  OV 03/06/21 She was admitted 8/27 to 9/7 for pericardial effusion and bilateral pleural effusions. She is status post pericardial drain and multiple thoracenteses. The pleural fluid studies were consistent with exudative process which were initially neutrophil predominant that later became lymphocyte predominant. Cytology was negative for malignancy but showed reactive mesothelial cells.  She is being treated for serositis with prednisone taper and colchicine for the pericardial effusion. She has been on 423mof prednisone over the past month. She has been on pneumocystis prophylaxis with atovaquone, she developed skin rash with bactrim. Her inflammatory workup was rather unrevealing as ANA, CCP anti-histone ab and SSA/SSB were negative. Her rheumatoid factor was elevated at 57.2.   She is feeling significantly better since discharge is resuming her normal activity and is quite satisfied with her recovering thus far.   Chest radiograph today shows opacities at the left lung base. The right lung is significantly improved since prior study with resolution of right pleural effusion.  Past [facility] History:  [facility]osis [facility]   [facility]   Allergy    Atrial fibrillation ([facility])    FH: cholecystectomy 1997   Macular degeneration    OSA (obstructive sleep apnea)    mild with total AHI 7.25/hr and severe during REM sleep at 35/hr now on CPAP at 6cm H2O     Family History  Problem Relation Age of Onset   Osteoporosis Mother    Macular degeneration Mother    Heart Problems Father    Stroke Father    Diabetes Brother    Kidney failure Brother    Cancer Maternal Grandmother    Dementia Paternal Grandmother    Suicidality Other    Heart attack Neg Hx      Social History   Socioeconomic History   Marital status: Married    Spouse name: Not on file   Number of children: Not on file   Years of education: Not on file   Highest education level: Not on file  Occupational History    Not on file  Tobacco Use   Smoking status: Never   Smokeless tobacco: Never  Vaping Use   Vaping Use: Never used  Substance and Sexual Activity   Alcohol use: Never   Drug use: Never   Sexual activity: Not on file  Other Topics Concern   Not on file  Social History Narrative   ** Merged History Encounter **       Social Determinants of Health   Financial Resource Strain: Not on file  Food Insecurity: Not on file  Transportation Needs: Not on file  Physical Activity: Not on file  Stress: Not on file  Social Connections: Not on file  Intimate Partner Violence: Not on file     Allergies  Allergen Reactions   Erythromycin Other (See Comments)  Other reaction(s): burning   Sulfa Antibiotics     Other reaction(s): Unknown   Azithromycin Other (See Comments)    Pain in arm with IV infusion    Condrolite [Glucosamine-Chondroitin-Msm] Rash   Glucosamine Rash    Other reaction(s): rash Other reaction(s): rash   Sulfamethoxazole-Trimethoprim Rash    Other reaction(s): rash Other reaction(s): rash     Outpatient Medications Prior to Visit  Medication Sig Dispense Refill   CALCIUM CITRATE-VITAMIN D PO Take 1 tablet by mouth at bedtime.     carboxymethylcellulose (REFRESH PLUS) 0.5 % SOLN Place 1 drop into both eyes 2 (two) times daily.     ELIQUIS 5 MG TABS tablet TAKE 1 TABLET BY MOUTH TWICE A DAY 60 tablet 6   EPIPEN 2-PAK 0.3 MG/0.3ML SOAJ injection Inject 0.3 mg into the muscle once as needed for anaphylaxis (severe allergic reaction).  0   famotidine (PEPCID) 10 MG tablet Take 1-2 tablets by mouth twice daily as needed 60 tablet 6   folic acid (FOLVITE) 1 MG tablet Take 1 tablet (1 mg total) by mouth daily. 90 tablet 3   furosemide (LASIX) 20 MG tablet Take 1 tablet (20 mg total) by mouth daily. 90 tablet 3   methotrexate (RHEUMATREX) 2.5 MG tablet TAKE 6 TABLETS (15 MG TOTAL) BY MOUTH ONCE A WEEK. CAUTION:CHEMOTHERAPY. PROTECT FROM LIGHT. 78 tablet 0   Multiple  Vitamin (MULTIVITAMIN WITH MINERALS) TABS tablet Take 1 tablet by mouth every morning.     Multiple Vitamins-Minerals (PRESERVISION AREDS PO) Take 1 capsule by mouth 2 (two) times daily.     potassium chloride (KLOR-CON) 20 MEQ packet USE 1 PACKET ONCE DAILY 90 packet 2   predniSONE (DELTASONE) 10 MG tablet Take 2 tablets (20 mg total) by mouth daily with breakfast. (Patient taking differently: Take 10 mg by mouth daily with breakfast.) 60 tablet 3   metoprolol tartrate (LOPRESSOR) 25 MG tablet Take 0.5 tablets (12.5 mg total) by mouth 2 (two) times daily. (Patient not taking: Reported on 05/29/2022) 90 tablet 3   No facility-administered medications prior to visit.   Review of Systems  Constitutional:  Positive for malaise/fatigue. Negative for chills, fever and weight loss.  HENT:  Negative for congestion, sinus pain and sore throat.   Eyes: Negative.   Respiratory:  Positive for shortness of breath (with exertion). Negative for cough, hemoptysis, sputum production and wheezing.   Cardiovascular:  Negative for chest pain, palpitations, orthopnea, claudication and leg swelling.  Gastrointestinal:  Negative for abdominal pain, heartburn, nausea and vomiting.  Genitourinary: Negative.   Musculoskeletal:  Negative for joint pain and myalgias.  Skin:  Negative for rash.  Neurological:  Negative for weakness.  Endo/Heme/Allergies:  Bruises/bleeds easily.  Psychiatric/Behavioral: Negative.     Objective:   Vitals:   05/29/22 0927  BP: 114/68  Pulse: 94  SpO2: 100%  Weight: 167 lb (75.8 kg)  Height: 5' 2" (1.575 m)   Physical Exam Constitutional:      General: She is not in acute distress.    Appearance: She is not ill-appearing.  HENT:     Head: Normocephalic and atraumatic.  Cardiovascular:     Rate and Rhythm: Normal rate and regular rhythm.     Pulses: Normal pulses.     Heart sounds: Normal heart sounds. No murmur heard. Pulmonary:     Effort: Pulmonary effort is normal.      Breath sounds: Examination of the left-middle field reveals decreased breath sounds. Examination of the left-lower field reveals  decreased breath sounds. Decreased breath sounds present. No wheezing, rhonchi or rales.  Musculoskeletal:     Right lower leg: No edema.     Left lower leg: No edema.  Skin:    General: Skin is warm and dry.     Findings: Bruising (on hands b/l) present.  Neurological:     General: No focal deficit present.     Mental Status: She is alert.  Psychiatric:        Mood and Affect: Mood normal.        Behavior: Behavior normal.        Thought Content: Thought content normal.        Judgment: Judgment normal.    CBC    Component Value Date/Time   WBC 10.1 05/01/2022 0933   RBC 4.52 05/01/2022 0933   HGB 14.3 05/01/2022 0933   HGB 15.7 12/27/2018 0831   HCT 42.8 05/01/2022 0933   HCT 47.4 (H) 12/27/2018 0831   PLT 212.0 05/01/2022 0933   PLT 213 12/27/2018 0831   MCV 94.5 05/01/2022 0933   MCV 90 12/27/2018 0831   MCH 32.1 03/03/2022 1404   MCHC 33.5 05/01/2022 0933   RDW 17.7 (H) 05/01/2022 0933   RDW 13.1 12/27/2018 0831   LYMPHSABS 0.4 (L) 05/01/2022 0933   MONOABS 0.7 05/01/2022 0933   EOSABS 0.0 05/01/2022 0933   BASOSABS 0.0 05/01/2022 0933      Latest Ref Rng & Units 05/01/2022    9:33 AM 03/03/2022    2:04 PM 12/30/2021    2:05 PM  BMP  Glucose 70 - 99 mg/dL 111  105  108   BUN 6 - 23 mg/dL _0 Creatinine 0.40 - 1.20 mg/dL 0.73  0.74  0.86   BUN/Creat Ratio 6 - 22 (calc)  SEE NOTE:  SEE NOTE:   Sodium 135 - 145 mEq/L 133  138  135   Potassium 3.5 - 5.1 mEq/L 4.2  4.7  4.9   Chloride 96 - 112 mEq/L 95  100  99   CO2 19 - 32 mEq/L 33  31  31   Calcium 8.4 - 10.5 mg/dL 9.1  9.4  9.5    Chest imaging: CXR 03/17/22 Unchanged left-sided pleural effusion with associated atelectasis/consolidation  CXR 12/06/21 Small left pleural effusion, decreased in size when compared with prior exam. Similar trace right pleural effusion.  CXR  11/25/21 1. Increased LEFT pleural effusion, now moderate to large in size. 2. Probable small RIGHT pleural effusion.  CXR 10/22/21 Stable left effusion. No blunting of the right costophrenic angle.  CXR 09/27/21 Mild-to-moderate cardiomegaly. Again seen is the obscuration of the left cardiac border and hemidiaphragm of pleural effusion with likely superimposed atelectasis/consolidation without significant interval change. There is some blunting of the right CP angle seen of likely small pleural effusion  CXR 08/20/21 Similar appearance of the chest x-ray with left-sided pleural effusion and associated atelectasis/consolidation  CXR 06/25/21 1. Stable small left pleural effusion. 2. No new focal lung infiltrate.  CXR 05/22/21 There is a small to medium size left pleural effusion, not significantly changed since 04/24/2021. Aeration of the left lung is also not significantly changed. The previously seen small right effusion has resolved. The right lung is now clear. There is no pneumothorax.  CXR 04/22/21 Increasing LEFT effusion and associated airspace disease. Correlate with any signs of infection. Given persistence/worsening underlying pulmonary or bronchial lesion would be difficult to exclude. Correlate with results of prior  thoracentesis and consider follow-up chest CT for further evaluation as warranted.   Trace RIGHT effusion with basilar atelectasis.  CXR 03/06/21 Resolved right-sided pleural effusion with persistent opacity on the left, likely a combination of persisting pleural fluid and associated atelectasis/consolidation  CXR 01/30/21 1. Slight interval decrease in size of the right pleural effusion with improved aeration of the right base. 2. No significant interval change of the larger left pleural effusion and adjacent airspace disease.  CT Chest 01/03/21 Moderate left and small right pleural effusions, which is increased on the left and new on the right in  comparison to prior CT. Adjacent bibasilar and lingular atelectasis.   Increased, moderate-sized pericardial effusion. Biatrial enlargement.   Mild pulmonary edema.  PFT:     No data to display          Labs: 01/21/21: Quantiferon gold indeterminate  Path:  Echo 01/20/21:  1. Left ventricular ejection fraction, by estimation, is 60 to 65%. The  left ventricle has normal function. The left ventricle has no regional  wall motion abnormalities.   2. A small pericardial effusion is present. There is no evidence of  cardiac tamponade.  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  She remains on 40m prednisone daily. We will check chest radiograph today. If it appears stable, then will reduce prednisone to 7.547mdaily.   She has follow up with Rheumatology next week. I wonder if tocilizumab would be a treatment option for her rheumatoid arthritis and associated pleural effusion. I will discuss with Dr. RiBenjamine Mola  Follow up in 1 month  JoFreda JacksonMD [facility] Office: [phone] Current Outpatient Medications:    CALCIUM CITRATE-VITAMIN D PO, Take 1 tablet by mouth at bedtime., Disp: , Rfl:    carboxymethylcellulose (REFRESH PLUS) 0.5 % SOLN, Place 1 drop into both eyes 2 (two) times daily., Disp: , Rfl:    ELIQUIS 5 MG TABS tablet, TAKE 1 TABLET BY MOUTH TWICE A DAY, Disp: 60 tablet, Rfl: 6   EPIPEN 2-PAK 0.3 MG/0.3ML SOAJ injection, Inject 0.3 mg into the muscle once as needed for anaphylaxis (severe allergic reaction)., Disp: , Rfl: 0   famotidine (PEPCID) 10 MG tablet, Take 1-2 tablets by mouth twice daily as needed, Disp: 60 tablet, Rfl: 6   folic acid  (FOLVITE) 1 MG tablet, Take 1 tablet (1 mg total) by mouth daily., Disp: 90 tablet, Rfl: 3   furosemide (LASIX) 20 MG tablet, Take 1 tablet (20 mg total) by mouth daily., Disp: 90 tablet, Rfl: 3   methotrexate (RHEUMATREX) 2.5 MG tablet, TAKE 6 TABLETS (15 MG TOTAL) BY MOUTH ONCE A WEEK. CAUTION:CHEMOTHERAPY. PROTECT FROM LIGHT., Disp: 78 tablet, Rfl: 0   Multiple Vitamin (MULTIVITAMIN WITH MINERALS) TABS tablet, Take 1 tablet by mouth every morning., Disp: , Rfl:    Multiple Vitamins-Minerals (PRESERVISION AREDS PO), Take 1 capsule by mouth 2 (two) times daily., Disp: , Rfl:    potassium chloride (KLOR-CON) 20 MEQ packet, USE 1 PACKET ONCE DAILY, Disp: 90 packet, Rfl: 2   predniSONE (DELTASONE) 10 MG tablet, Take 2 tablets (20 mg total) by mouth daily with breakfast. (Patient taking differently: Take 10 mg by mouth daily  with breakfast.), Disp: 60 tablet, Rfl: 3   metoprolol tartrate (LOPRESSOR) 25 MG tablet, Take 0.5 tablets (12.5 mg total) by mouth 2 (two) times daily. (Patient not taking: Reported on 05/29/2022), Disp: 90 tablet, Rfl: 3

## 2022-05-29 NOTE — Patient Instructions (Addendum)
We will check a chest x-ray today and let you know the results and whether to decrease prednisone to 7.5mg  per day.  Follow up in 1 month

## 2022-05-30 ENCOUNTER — Telehealth: Payer: Self-pay | Admitting: Pulmonary Disease

## 2022-05-30 MED ORDER — ATENOLOL 25 MG PO TABS: ORAL_TABLET | ORAL | 3 refills | Status: AC

## 2022-05-30 NOTE — Telephone Encounter (Signed)
Patient notified.  She will continue to monitor BP and heart rate.  Prescription sent to [facility] in [facility]

## 2022-05-30 NOTE — Telephone Encounter (Signed)
Plan to reduce prednisone to 7.5mg  daily from 10mg  daily as her chest x-ray looks stable. Patient ok with the above changes.  Freda Jackson, MD [facility] Office: [phone]   See Amion for personal pager PCCM on call pager [phone] until 7pm. Please call Elink 7p-7a. [phone]

## 2022-05-30 NOTE — Addendum Note (Signed)
Addended by: Thompson Grayer on: 05/30/2022 04:09 PM   Modules accepted: Orders

## 2022-05-30 NOTE — Telephone Encounter (Signed)
Hold metoprolol.  Prednisone may rev up HR at times, but would hold metoprolol since it seems to lower BP too much.  Could try atenolol 12.5 mg PO daily prn if her HR goes to > 110.    Jettie Booze, MD

## 2022-06-02 NOTE — Progress Notes (Deleted)
Office Visit Note  Patient: Rebecca Short             Date of Birth: [birth year]           MRN: [id number]             PCP: Daisy Floro, MD Referring: Daisy Floro, MD Visit Date: 06/03/2022   Subjective:  No chief complaint on file.   History of Present Illness: Rebecca Short is a 82 y.o. 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 She had some episode of increased swelling and dyspnea in December but with stable pleural effusion and has continued with gradual prednisone taper successfully.***   Previous HPI 03/03/22 Rebecca Short is a 82 y.o. 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 So far her respiratory symptoms are doing well since last visit. Her mood and energy level are very poor, and frustrated about inability to socialize and limited exertion tolerance. She reports alternating days of more and less energy, sometimes associated with the methotrexate doses on Mondays but not always. Also having some concentration and memory difficulties increased from before for a few months.   Previous HPI 12/30/2021 Rebecca Short is a 82 y.o. 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 She was hospitalized last month with recurrent pleural effusion 7/4-7/7 requiring repeat thoracocentesis. Since discharge she is back onto higher dose prednisone and symptoms are doing okay. We stopped her azathioprine due to apparent treatment failure and she started methotrexate since 7/31 after negative hepatitis screening. She has not seen any problems with GI intolerance. She does feel somewhat unsteady or lightheaded and having to move slowly on account of this.   Previous HPI 09/09/2021 Rebecca Short is a 82 y.o. female here for follow up for  rheumatoid pleural effusion on azathioprine 100 mg daily and prednisone taper and atovaquone ppx. She is doing well since last visit without major events. She has some mild GI irritation with the azathioprine. She has significant bruising on her hands and forearms. She saw Dr. Francine Graven for follow up with stable pulmonary symptoms and repeat chest xray obtained 3/28 was also stable. She is tapering the prednisone of a rate about 5 mg/day dose per month currently down to 20 mg daily. She is curious about safety going to the dentist for procedures on her medications.     Previous HPI 07/09/21 Rebecca Short is a 82 y.o. female here for follow up for rheumatoid pleural effusions after starting azathioprine 50 mg last month, continuing prednisone 40 mg daily and prophylactic atovaquone. She went for repeat chest xray with Dr. Francine Graven that looked stable 2 weeks ago. So far she denies any major side effects after starting imuran.   Previous HPI 05/08/21 Rebecca Short is a 82 y.o. female here for evaluation with recurrent pleural effusions and dyspnea with positive RF and ANCA Abs. She was hospitalized in August after progressively worsening dyspnea and hypotension with exudative pleural fluid removed from left lung space followed by pericardial drainage and initial treatment with colchicine. She subsequently underwent additional thoracentesis drainage of effusions later in August and beginning of September. Results showed neutrophil predominant fluid. Prednisone 40 mg daily  was added to treatment with stable condition. This continued for a month but tapering the prednisone resulted in more rapid re accumulation of fluid on a dose of about 20 gm daily or less. Imaging demonstrated progressive increase again in left sided effusions and repeat drainage again on 11/14 and 11/30 when down to 10 mg and 5 mg daily doses of prednisone along with colchicine. She is now continuing prednisone 40 mg with atovaquone prophylaxis and  colchicine 0.6 mg daily. In addition to pulmonary symptoms she reports bilateral shoulder pain and stiffness developing around the initial onset of her dyspnea before hospitalization in August.  This improved completely on 40 mg prednisone but she noticed definite recurrence of shoulder pain symptoms when tapering down the prednisone to 10 or 5 mg/day dose.  No active complaints today since she is back on higher dose medication.  She denies any peripheral joint pain involvement did not notice any significant swelling or numbness in her upper extremities associated with the pain.     No Rheumatology ROS completed.   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   Pleural effusion 11/26/2021   Serositis ([place]) 09/27/2021   Fatigue 09/27/2021   Macular pucker, left eye 06/18/2021   High risk medication use 05/30/2021   Bilateral shoulder pain 05/08/2021   Vitamin D deficiency 05/08/2021   Abnormal ANCA test 05/08/2021   RA (rheumatoid arthritis) ([place]) 04/22/2021   S/P thoracentesis    Arterial hypotension    Pleural effusion on left    Pericardial effusion    Leukocytosis 01/03/2021   Community acquired pneumonia 01/03/2021   Atrial fibrillation ([place]) 12/24/2020   Atrial fibrillation with RVR ([place]) 12/23/2020   Musculoskeletal pain 12/23/2020   Posterior vitreous detachment of both eyes 04/30/2020   Macular pucker, right eye 11/08/2019   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[place]) 11/08/2019   Intermediate stage nonexudative age-related macular degeneration of both eyes 11/08/2019   Anticoagulated 11/18/2016   Bradycardia 03/23/2014   OSA (obstructive sleep apnea) 01/19/2014   Obesity (BMI 30-39.9) 01/19/2014   Paroxysmal A-fib ([place]) 10/04/2013   Nonspecific abnormal unspecified cardiovascular function study 07/21/2013   Acute gastroenteritis  07/15/2013    Past Medical History:  Diagnosis Date   A-fib [facility])    Allergy    Atrial fibrillation ([facility])    FH: cholecystectomy 1997   Macular degeneration    OSA (obstructive sleep apnea)    mild with total AHI 7.25/hr and severe during REM sleep at 35/hr now on CPAP at 6cm H2O    Family History  Problem Relation Age of Onset   Osteoporosis Mother    Macular degeneration Mother    Heart Problems Father    Stroke Father    Diabetes Brother    Kidney failure Brother    Cancer Maternal Grandmother    Dementia Paternal Grandmother    Suicidality Other    Heart attack Neg Hx    Past Surgical History:  Procedure Laterality Date   CHEST TUBE INSERTION Left 11/27/2021   Procedure: CHEST TUBE INSERTION;  Surgeon: Freddi Starr, MD;  Location: [facility] ENDOSCOPY;  Service: Pulmonary;  Laterality: Left;  Prefer AM slot, thanks   CHOLECYSTECTOMY     PERICARDIOCENTESIS N/A 01/07/2021   Procedure: PERICARDIOCENTESIS;  Surgeon: Nelva Bush, MD;  Location: [place] [facility] LAB;  Service: Cardiovascular;  Laterality: N/A;   REPLACEMENT TOTAL KNEE BILATERAL  THORACENTESIS N/A 01/25/2021   Procedure: Alanson Puls;  Surgeon: Martina Sinner, MD;  Location: [facility] ENDOSCOPY;  Service: Pulmonary;  Laterality: N/A;   THORACENTESIS N/A 04/08/2021   Procedure: THORACENTESIS;  Surgeon: Martina Sinner, MD;  Location: [facility] ENDOSCOPY;  Service: Pulmonary;  Laterality: N/A;   THORACENTESIS N/A 04/24/2021   Procedure: THORACENTESIS;  Surgeon: Lupita Leash, MD;  Location: [facility] ENDOSCOPY;  Service: Cardiopulmonary;  Laterality: N/A;   Social History   Social History Narrative   ** Merged History Encounter **       Immunization History  Administered Date(s) Administered   Influenza, High Dose Seasonal PF 07/05/2014, 03/03/2015, 06/29/2015, 07/09/2016, 03/03/2017, 07/24/2017, 02/05/2018, 06/23/2018, 06/08/2019, 02/18/2020, 06/08/2020   Influenza, Quadrivalent, Recombinant, Inj, Pf 03/04/2019    Influenza-Unspecified 01/24/2014   PFIZER(Purple Top)SARS-COV-2 Vaccination 06/16/2019, 07/07/2019, 02/18/2020, 06/08/2020   Pneumococcal Polysaccharide-23 03/22/2015, 06/29/2015, 07/09/2016, 07/24/2017, 06/23/2018, 06/08/2019, 06/08/2020, 06/03/2021   Zoster Recombinat (Shingrix) 03/09/2017     Objective: Vital Signs: There were no vitals taken for this visit.   Physical Exam   Musculoskeletal Exam: ***  CDAI Exam: CDAI Score: -- Patient Global: --; Provider Global: -- Swollen: --; Tender: -- Joint Exam 06/03/2022   No joint exam has been documented for this visit   There is currently no information documented on the homunculus. Go to the Rheumatology activity and complete the homunculus joint exam.  Investigation: No additional findings.  Imaging: DG Chest 2 View  Result Date: 05/30/2022 CLINICAL DATA:  Pleural effusion. EXAM: CHEST - 2 VIEW COMPARISON:  05/01/2022. FINDINGS: The heart size and mediastinal contours are within normal limits. There is a moderate left pleural effusion with atelectasis. There is a trace right pleural effusion. No pneumothorax. Degenerative changes are noted in the thoracic spine. No acute osseous abnormality. IMPRESSION: 1. Stable moderate left pleural effusion with atelectasis. 2. Trace right pleural effusion. Electronically Signed   By: Thornell Sartorius M.D.   On: 05/30/2022 04:08    Recent Labs: Lab Results  Component Value Date   WBC 10.1 05/01/2022   HGB 14.3 05/01/2022   PLT 212.0 05/01/2022   NA 133 (L) 05/01/2022   K 4.2 05/01/2022   CL 95 (L) 05/01/2022   CO2 33 (H) 05/01/2022   GLUCOSE 111 (H) 05/01/2022   BUN 12 05/01/2022   CREATININE 0.73 05/01/2022   BILITOT 1.0 05/01/2022   ALKPHOS 51 05/01/2022   AST 26 05/01/2022   ALT 24 05/01/2022   PROT 6.2 05/01/2022   ALBUMIN 3.7 05/01/2022   CALCIUM 9.1 05/01/2022   GFRAA 90 12/27/2018   QFTBGOLDPLUS Negative 11/28/2021    Speciality Comments: No specialty comments  available.  Procedures:  No procedures performed Allergies: Erythromycin, Sulfa antibiotics, Azithromycin, Condrolite [glucosamine-chondroitin-msm], Glucosamine, and Sulfamethoxazole-trimethoprim   Assessment / Plan:     Visit Diagnoses: No diagnosis found.  ***  Orders: No orders of the defined types were placed in this encounter.  No orders of the defined types were placed in this encounter.    Follow-Up Instructions: No follow-ups on file.   Fuller Plan, MD  Note - This record has been created using [employer].  Chart creation errors have been sought, but may not always  have been located. Such creation errors do not reflect on  the standard of medical care.

## 2022-06-03 ENCOUNTER — Ambulatory Visit: Payer: Medicare Other | Admitting: Internal Medicine

## 2022-06-03 NOTE — Progress Notes (Signed)
Office Visit Note  Patient: Rebecca Short             Date of Birth: [birth year]           MRN: [id number]             PCP: Daisy Floro, MD Referring: Daisy Floro, MD Visit Date: 06/04/2022   Subjective:  Pain of the Left Shoulder and Pain of the Right Shoulder   History of Present Illness: Rebecca Short is a 82 y.o. 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 She had some exacerbation with edema and dyspnea but stable pleural effusion on imaging. Labs with slightly worsening of lymphopenia last month as well.  She is noticing what she thinks is a bit more nausea and fatigue type side effects after the methotrexate weekly dose than before taking up to a few days to fully recover rather than less than 1.  Since tapering the steroid dose down to 7.5 mg about a week ago she is experiencing increased in shoulder pain and stiffness on both sides.   Previous HPI 03/03/22 Rebecca Short is a 82 y.o. 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 So far her respiratory symptoms are doing well since last visit. Her mood and energy level are very poor, and frustrated about inability to socialize and limited exertion tolerance. She reports alternating days of more and less energy, sometimes associated with the methotrexate doses on Mondays but not always. Also having some concentration and memory difficulties increased from before for a few months.   Previous HPI 12/30/2021 Rebecca Short is a 82 y.o. 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 She was hospitalized last month with recurrent pleural effusion 7/4-7/7 requiring repeat thoracocentesis. Since discharge she is back onto higher dose prednisone and symptoms are doing okay. We stopped her  azathioprine due to apparent treatment failure and she started methotrexate since 7/31 after negative hepatitis screening. She has not seen any problems with GI intolerance. She does feel somewhat unsteady or lightheaded and having to move slowly on account of this.   Previous HPI 09/09/2021 Rebecca Short is a 82 y.o. 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 She is doing well since last visit without major events. She has some mild GI irritation with the azathioprine. She has significant bruising on her hands and forearms. She saw Dr. Francine Graven for follow up with stable pulmonary symptoms and repeat chest xray obtained 3/28 was also stable. She is tapering the prednisone of a rate about 5 mg/day dose per month currently down to 20 mg daily. She is curious about safety going to the dentist for procedures on her medications.   Previous HPI 07/09/21 Rebecca Short is a 82 y.o. female here for follow up for rheumatoid pleural effusions after starting azathioprine 50 mg last month, continuing prednisone 40 mg daily and prophylactic atovaquone. She went for repeat chest xray with Dr. Francine Graven that looked stable 2 weeks ago. So far she denies any major side effects after starting imuran.   Previous HPI 05/08/21 Rebecca Short is a 82 y.o. female here for  evaluation with recurrent pleural effusions and dyspnea with positive RF and ANCA Abs. She was hospitalized in August after progressively worsening dyspnea and hypotension with exudative pleural fluid removed from left lung space followed by pericardial drainage and initial treatment with colchicine. She subsequently underwent additional thoracentesis drainage of effusions later in August and beginning of September. Results showed neutrophil predominant fluid. Prednisone 40 mg daily was added to treatment with stable condition. This continued for a month but tapering the prednisone  resulted in more rapid re accumulation of fluid on a dose of about 20 gm daily or less. Imaging demonstrated progressive increase again in left sided effusions and repeat drainage again on 11/14 and 11/30 when down to 10 mg and 5 mg daily doses of prednisone along with colchicine. She is now continuing prednisone 40 mg with atovaquone prophylaxis and colchicine 0.6 mg daily. In addition to pulmonary symptoms she reports bilateral shoulder pain and stiffness developing around the initial onset of her dyspnea before hospitalization in August.  This improved completely on 40 mg prednisone but she noticed definite recurrence of shoulder pain symptoms when tapering down the prednisone to 10 or 5 mg/day dose.  No active complaints today since she is back on higher dose medication.  She denies any peripheral joint pain involvement did not notice any significant swelling or numbness in her upper extremities associated with the pain.   Review of Systems  Constitutional:  Positive for fatigue.  HENT:  Negative for mouth sores and mouth dryness.   Eyes:  Positive for dryness.  Respiratory:  Positive for shortness of breath.   Cardiovascular:  Negative for chest pain and palpitations.  Gastrointestinal:  Negative for blood in stool, constipation and diarrhea.  Endocrine: Negative for increased urination.  Genitourinary:  Positive for involuntary urination.  Musculoskeletal:  Positive for joint pain, gait problem and joint pain. Negative for joint swelling, myalgias, muscle weakness, morning stiffness, muscle tenderness and myalgias.  Skin:  Positive for hair loss. Negative for color change, rash and sensitivity to sunlight.  Allergic/Immunologic: Negative for susceptible to infections.  Neurological:  Negative for dizziness and headaches.  Hematological:  Negative for swollen glands.  Psychiatric/Behavioral:  Positive for depressed mood. Negative for sleep disturbance. The patient is nervous/anxious.      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   Pleural effusion 11/26/2021   Serositis (HCC) 09/27/2021   Fatigue 09/27/2021   Macular pucker, left eye 06/18/2021   High risk medication use 05/30/2021   Bilateral shoulder pain 05/08/2021   Vitamin D deficiency 05/08/2021   Abnormal ANCA test 05/08/2021   RA (rheumatoid arthritis) (HCC) 04/22/2021   S/P thoracentesis    Arterial hypotension    Pleural effusion on left    Pericardial effusion    Leukocytosis 01/03/2021   Community acquired pneumonia 01/03/2021   Atrial fibrillation (HCC) 12/24/2020   Atrial fibrillation with RVR (HCC) 12/23/2020   Musculoskeletal pain 12/23/2020   Posterior vitreous detachment of both eyes 04/30/2020   Macular pucker, right eye 11/08/2019   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HCC) 11/08/2019   Intermediate stage nonexudative age-related macular degeneration of both eyes 11/08/2019   Anticoagulated 11/18/2016   Bradycardia 03/23/2014   OSA (obstructive sleep apnea) 01/19/2014   Obesity (BMI 30-39.9) 01/19/2014   Paroxysmal A-fib (HCC) 10/04/2013   Nonspecific abnormal unspecified cardiovascular function study 07/21/2013   Acute gastroenteritis 07/15/2013  Past Medical History:  Diagnosis Date   A-fib [facility])    Allergy    Atrial fibrillation (HCC)    FH: cholecystectomy 1997   Macular degeneration    OSA (obstructive sleep apnea)    mild with total AHI 7.25/hr and severe during REM sleep at 35/hr now on CPAP at 6cm H2O    Family History  Problem Relation Age of Onset   Osteoporosis Mother    Macular degeneration Mother    Heart Problems Father    Stroke Father    Diabetes Brother    Kidney failure Brother    Cancer Maternal Grandmother    Dementia Paternal Grandmother    Suicidality Other    Heart attack Neg Hx    Past Surgical History:   Procedure Laterality Date   CHEST TUBE INSERTION Left 11/27/2021   Procedure: CHEST TUBE INSERTION;  Surgeon: Freddi Starr, MD;  Location: [facility] ENDOSCOPY;  Service: Pulmonary;  Laterality: Left;  Prefer AM slot, thanks   CHOLECYSTECTOMY     PERICARDIOCENTESIS N/A 01/07/2021   Procedure: PERICARDIOCENTESIS;  Surgeon: Nelva Bush, MD;  Location: [place] CV LAB;  Service: Cardiovascular;  Laterality: N/A;   REPLACEMENT TOTAL KNEE BILATERAL     THORACENTESIS N/A 01/25/2021   Procedure: THORACENTESIS;  Surgeon: Freddi Starr, MD;  Location: [facility] ENDOSCOPY;  Service: Pulmonary;  Laterality: N/A;   THORACENTESIS N/A 04/08/2021   Procedure: THORACENTESIS;  Surgeon: Freddi Starr, MD;  Location: [facility] ENDOSCOPY;  Service: Pulmonary;  Laterality: N/A;   THORACENTESIS N/A 04/24/2021   Procedure: THORACENTESIS;  Surgeon: Juanito Doom, MD;  Location: [place];  Service: Cardiopulmonary;  Laterality: N/A;   Social History   Social History Narrative   ** Merged History Encounter **       Immunization History  Administered Date(s) Administered   Influenza, High Dose Seasonal PF 07/05/2014, 03/03/2015, 06/29/2015, 07/09/2016, 03/03/2017, 07/24/2017, 02/05/2018, 06/23/2018, 06/08/2019, 02/18/2020, 06/08/2020   Influenza, Quadrivalent, Recombinant, Inj, Pf 03/04/2019   Influenza-Unspecified 01/24/2014   PFIZER(Purple Top)SARS-COV-2 Vaccination 06/16/2019, 07/07/2019, 02/18/2020, 06/08/2020   Pneumococcal Polysaccharide-23 03/22/2015, 06/29/2015, 07/09/2016, 07/24/2017, 06/23/2018, 06/08/2019, 06/08/2020, 06/03/2021   Zoster Recombinat (Shingrix) 03/09/2017     Objective: Vital Signs: BP 121/78 (BP Location: Left Arm, Patient Position: Sitting)   Pulse (!) 54   Resp 13   Ht 5\' 2"  (1.575 m)   Wt 165 lb 12.8 oz (75.2 kg)   BMI 30.33 kg/m    Physical Exam Eyes:     Conjunctiva/sclera: Conjunctivae normal.  Cardiovascular:     Rate and Rhythm: Normal rate and regular  rhythm.  Pulmonary:     Effort: Pulmonary effort is normal.     Comments: Left lower lung field dull Lymphadenopathy:     Cervical: No cervical adenopathy.  Skin:    General: Skin is warm and dry.     Findings: Bruising present.     Comments: Pitting edema less than 1/2 from ankle towards knee on right, about 2 inches on left leg  Neurological:     Mental Status: She is alert.  Psychiatric:        Mood and Affect: Mood normal.      Musculoskeletal Exam:  Shoulders full ROM pain with overhead abduction both sides, no palpable swelling or focal tenderness Elbows full ROM no tenderness or swelling Wrists full ROM no tenderness or swelling Fingers full ROM no tenderness or swelling Knees full ROM no tenderness or swelling   Investigation: No additional findings.  Imaging: DG Chest 2  View  Result Date: 05/30/2022 CLINICAL DATA:  Pleural effusion. EXAM: CHEST - 2 VIEW COMPARISON:  05/01/2022. FINDINGS: The heart size and mediastinal contours are within normal limits. There is a moderate left pleural effusion with atelectasis. There is a trace right pleural effusion. No pneumothorax. Degenerative changes are noted in the thoracic spine. No acute osseous abnormality. IMPRESSION: 1. Stable moderate left pleural effusion with atelectasis. 2. Trace right pleural effusion. Electronically Signed   By: Thornell Sartorius M.D.   On: 05/30/2022 04:08    Recent Labs: Lab Results  Component Value Date   WBC 10.1 05/01/2022   HGB 14.3 05/01/2022   PLT 212.0 05/01/2022   NA 133 (L) 05/01/2022   K 4.2 05/01/2022   CL 95 (L) 05/01/2022   CO2 33 (H) 05/01/2022   GLUCOSE 111 (H) 05/01/2022   BUN 12 05/01/2022   CREATININE 0.73 05/01/2022   BILITOT 1.0 05/01/2022   ALKPHOS 51 05/01/2022   AST 26 05/01/2022   ALT 24 05/01/2022   PROT 6.2 05/01/2022   ALBUMIN 3.7 05/01/2022   CALCIUM 9.1 05/01/2022   GFRAA 90 12/27/2018   QFTBGOLDPLUS Negative 11/28/2021    Speciality Comments: No specialty  comments available.  Procedures:  Large Joint Inj: R glenohumeral on 06/04/2022 10:20 AM Indications: pain Details: 25 G 1.5 in needle, posterior approach Medications: 3 mL lidocaine 1 %; 40 mg triamcinolone acetonide 40 MG/ML Outcome: tolerated well, no immediate complications Procedure, treatment alternatives, risks and benefits explained, specific risks discussed. Consent was given by the patient. Immediately prior to procedure a time out was called to verify the correct patient, procedure, equipment, support staff and site/side marked as required. Patient was prepped and draped in the usual sterile fashion.     Allergies: Erythromycin, Sulfa antibiotics, Azithromycin, Condrolite [glucosamine-chondroitin-msm], Glucosamine, and Sulfamethoxazole-trimethoprim   Assessment / Plan:     Visit Diagnoses: Rheumatoid arthritis involving multiple sites with positive rheumatoid factor (HCC) - Plan: TUBERCULIN SYR 1CC/26GX3/8" 26G X 3/8" 1 ML MISC, methotrexate 50 MG/2ML injection  No obvious joint inflammation notable on exam continue prednisone tapering.  She is having some worsened tolerance with the methotrexate I recommend to switch to subcutaneous methotrexate which will usually decrease GI side effect.  Most recent labs from December 7 were fine without evidence of methotrexate toxicity so will not repeat today.  Down to 12.5 mg subcu weekly to maintain similar effective dosing.  Continue folic acid 1 mg daily and the prednisone at 7.5 mg daily with the gradual tapering plan as recommended with Dr. Francine Graven.  Chronic pain of both shoulders - Plan: Large Joint Inj: R glenohumeral  Increased shoulder pain I expect is unmasking of existing joint problem with the prednisone tapering.  I do not think there is very severe glenohumeral joint osteoarthritis since the passive range of motion remains quite good some may be having more of bursitis or rotator cuff arthropathy pain.  Worst area is the right  shoulder we will treat with intra-articular steroid injection today.  Pleural effusion  Stable moderate left pleural effusion on most recent chest x-ray from January 4 and all lung sounds on exam today are consistent.  No significant pain or dyspnea complaints at present.  Orders: Orders Placed This Encounter  Procedures   Large Joint Inj: R glenohumeral   Meds ordered this encounter  Medications   TUBERCULIN SYR 1CC/26GX3/8" 26G X 3/8" 1 ML MISC    Sig: Use for methotrexate injection once weekly    Dispense:  25 each  Refill:  0    Pharmacy substitution for equivalent needle/syringe if needed okay   methotrexate 50 MG/2ML injection    Sig: Inject 0.5 mLs (12.5 mg total) into the skin once a week.    Dispense:  4 mL    Refill:  1     Follow-Up Instructions: Return in about 2 months (around 08/03/2022) for RA/pleural effusion on MTX/GC and inj f/u 8mos.   Fuller Plan, MD  Note - This record has been created using [employer].  Chart creation errors have been sought, but may not always  have been located. Such creation errors do not reflect on  the standard of medical care.

## 2022-06-04 ENCOUNTER — Encounter: Payer: Self-pay | Admitting: Internal Medicine

## 2022-06-04 ENCOUNTER — Ambulatory Visit: Payer: Medicare Other | Attending: Internal Medicine | Admitting: Internal Medicine

## 2022-06-04 VITALS — BP 121/78 | HR 54 | Resp 13 | Ht 62.0 in | Wt 165.8 lb

## 2022-06-04 DIAGNOSIS — M25511 Pain in right shoulder: Secondary | ICD-10-CM | POA: Diagnosis present

## 2022-06-04 DIAGNOSIS — M25512 Pain in left shoulder: Secondary | ICD-10-CM | POA: Diagnosis present

## 2022-06-04 DIAGNOSIS — G8929 Other chronic pain: Secondary | ICD-10-CM | POA: Diagnosis present

## 2022-06-04 DIAGNOSIS — J90 Pleural effusion, not elsewhere classified: Secondary | ICD-10-CM | POA: Diagnosis present

## 2022-06-04 DIAGNOSIS — M0579 Rheumatoid arthritis with rheumatoid factor of multiple sites without organ or systems involvement: Secondary | ICD-10-CM | POA: Insufficient documentation

## 2022-06-04 MED ORDER — METHOTREXATE SODIUM CHEMO INJECTION 50 MG/2ML: 12.5000 mg | INTRAMUSCULAR | 1 refills | Status: DC

## 2022-06-04 MED ORDER — "TUBERCULIN SYRINGE 26G X 3/8"" 1 ML MISC": 0 refills | Status: DC

## 2022-06-18 ENCOUNTER — Other Ambulatory Visit: Payer: Self-pay | Admitting: Internal Medicine

## 2022-06-18 DIAGNOSIS — M0579 Rheumatoid arthritis with rheumatoid factor of multiple sites without organ or systems involvement: Secondary | ICD-10-CM

## 2022-06-19 ENCOUNTER — Encounter (INDEPENDENT_AMBULATORY_CARE_PROVIDER_SITE_OTHER): Payer: Medicare Other | Admitting: Ophthalmology

## 2022-06-21 MED ORDER — TRIAMCINOLONE ACETONIDE 40 MG/ML IJ SUSP
40.0000 mg | INTRAMUSCULAR | Status: AC | PRN
  Administered 2022-06-04: 40 mg via INTRA_ARTICULAR

## 2022-06-21 MED ORDER — LIDOCAINE HCL 1 % IJ SOLN
3.0000 mL | INTRAMUSCULAR | Status: AC | PRN
  Administered 2022-06-04: 3 mL

## 2022-06-22 ENCOUNTER — Other Ambulatory Visit: Payer: Self-pay | Admitting: Internal Medicine

## 2022-06-22 DIAGNOSIS — M0579 Rheumatoid arthritis with rheumatoid factor of multiple sites without organ or systems involvement: Secondary | ICD-10-CM

## 2022-06-23 ENCOUNTER — Other Ambulatory Visit: Payer: Self-pay

## 2022-06-23 DIAGNOSIS — M0579 Rheumatoid arthritis with rheumatoid factor of multiple sites without organ or systems involvement: Secondary | ICD-10-CM

## 2022-06-23 MED ORDER — METHOTREXATE SODIUM CHEMO INJECTION 50 MG/2ML: 12.5000 mg | INTRAMUSCULAR | 1 refills | Status: DC

## 2022-06-23 NOTE — Telephone Encounter (Signed)
Patient called in regards to MTX prescription because the pharmacy advised her that there are not any refills for the prescription. Patient states she is using the single dose vials.  I called the pharmacy to confirm and preservative free is being dispensed to the patient so therefore, patient is in need of a refill. I called patient and advised I would send the refill request forward to Dr. Benjamine Mola.   Next Visit: 08/04/2022  Last Visit: 06/04/2022  Last Fill: 06/04/2022  DX: Rheumatoid arthritis involving multiple sites with positive rheumatoid factor   Current Dose per office note on 06/04/2022: Most recent labs from December 7 were fine without evidence of methotrexate toxicity so will not repeat today. Down to 12.5 mg subcu weekly to maintain similar effective dosing.   Labs: 05/01/2022 sodium 133, chloride 95, CO2 33, glucose 111, RDW 17.7, neutrophils relative 88.1, lymphocytes relative 4.3, neutro abs 8.9, lymphs abs 0.4  Okay to refill MTX?

## 2022-06-24 ENCOUNTER — Telehealth: Payer: Self-pay | Admitting: *Deleted

## 2022-06-24 NOTE — Telephone Encounter (Signed)
Submitted a Prior Authorization request to [facility] for  Methotrexate  via CoverMyMeds. Will update once we receive a response.

## 2022-07-01 ENCOUNTER — Ambulatory Visit (INDEPENDENT_AMBULATORY_CARE_PROVIDER_SITE_OTHER): Payer: Medicare Other | Admitting: Pulmonary Disease

## 2022-07-01 ENCOUNTER — Encounter: Payer: Self-pay | Admitting: Pulmonary Disease

## 2022-07-01 ENCOUNTER — Ambulatory Visit (INDEPENDENT_AMBULATORY_CARE_PROVIDER_SITE_OTHER): Payer: Medicare Other

## 2022-07-01 VITALS — BP 130/80 | HR 114 | Ht 62.0 in | Wt 165.4 lb

## 2022-07-01 DIAGNOSIS — J90 Pleural effusion, not elsewhere classified: Secondary | ICD-10-CM | POA: Diagnosis not present

## 2022-07-01 NOTE — Patient Instructions (Addendum)
Start prednisone 5mg  daily  Your chest x-ray is stable today  Follow up in 1 month

## 2022-07-01 NOTE — Progress Notes (Unsigned)
Synopsis: Referred in October 2022 for hospital follow up for pleural effusions  Subjective:   PATIENT ID: Rebecca Short GENDER: female DOB: [birth year], MRN: [id number]  HPI  Chief Complaint  Patient presents with   Follow-up    Pt f/u states that her breathing is better, does still have some SOB but otherwise fine. No concerns   Rebecca Short is an 82 year old woman, never smoker with history of obstructive sleep apnea on CPAP and atrial fibrillation who returns to pulmonary clinic for follow up of serositis due to suspected rheumatoid arthritis and chronic left pleural effusion.   She has done well with tapering to 7.5mg  of prednisone since last visit. No increase in shortness of breath. She has been transitioned to IM methotrexate from PO and is tolerating it well.   Chest x-ray today shows slightly increased left effusion compared to last month.   OV 05/29/22 She was treated with Zpak at last visit for possible lower respiratory infection based on chest radiograph with concern for right medial lower lobe infiltrate.   CRP and ESR were not elevated at last visit. Chest radiograph was stable.  She has not been doing her workout regimen as she has not felt like it. She has had issues with hypotension when taking her metoprolol. This has been held. She has felt like this over the past 2 weeks. Denies fevers, chills or sweats. Her appetite is down. She has noticed right shoulder discomfort intermittently.  OV 05/01/22 She was tapered to 10mg  daily at last visit on 11/20. She reports not feeling well since Monday with increased dyspnea, reduced appetite and changes in her blood pressure. She was not able to eat her cereal on Monday due to lack of appetite. She reports low BP readings with sBP in the 80s. Her metoprolol dose was decreased in half with improvement in her blood pressure but her baseline HR is 90s-100s. Her appetite has since improved. She does report lower extremity edema, right >  left. Denies fevers, chills, sweats, nausea or vomiting.   OV 04/14/22 She was tapered to 12.5mg  of prednisone at last visit. She has been doing well since last visit. She continues methotrexate weekly.  OV 03/17/22 She was seen by Dr. 03/19/22 03/03/22. She has continued on methotrexate 15mg  weekly with folic acid. She continues ton 15mg  of prednisone daily.  She has been feeling fine since last visit. Repeat Chest x-ray shows persistent left pleural effusion.   OV 01/21/22 She started methotrexate therapy 7/31 and is currently on 15mg  weekly with folic acid 1mg  daily. She has continued on 15mg  prednisone daily. Recommended 2 months of methotrexate treatment prior to tapering of her steroids.  She was evalauted by CT surgery, PET scan is negative. She was offered VATs with mechanical pleuredesis or pleurX catheter placement but has opted not to pursue these options at this time. Chest radiograph 8/11 shows mild increase in left pleural effusion.  Her symptoms remain stable at this time on the above regimen.   She expressed frustration with her health and not feeling completely better after the last year and she expresses concern with the immunosuppression side effects.   OV 12/06/21 She was tapered down to 10mg  of prednisone daily on 6/12. She reported  progressive fatigue and shortness of breath over week on 7/3. Chest x-ray showed an increase in her left pleural effusion as well as return of a small right pleural effusion. She was admitted 7/4 and left pigtail chest tube was placed  on 7/5. She drained a total of 822mL of fluid with minimal drainage there after. CT chest scan after chest tube placement showed LLL infiltrate vs atelecatsis and resolution of the effusion on the left. There was no need for pleural lytic therapy and her lung expanded appropriately. The effusion was again exudative. Cytology was negative for malignancy.  Her prednisone was increased back to 20mg  daily an she continued  on azathioprine 100mg  daily. She is feeling much better since before the hospital.  Dr. Benjamine Mola of rheumatology was notified. We discussed transition to methotrexate from azathioprine.   Her pleural fluid ADA was not elevated and the AFB smear on pleural fluid is negative. Quantiferon gold is negative.   Chest radiograph today shows return of left pleural effusion, but not as significant as her 7/3 chest x-ray. Right effusion has decreased or resolved.   OV 09/3021 She was seen by Roxan Diesel, NP on 09/27/21 for acute visit due to worsening joint pains and concern for worsening of her pleural effusion. Chest radiograph was overall stable compared to March. It was recommended she continue on the scheduled prednisone taper.   Since that visit she has not had further issues with joint pains or return of dyspnea. She overall feels well. She is currently taking 15mg  prednisone daily and has stopped atovaquone antibiotic.  She complains of ear fullness and having trouble hearing and is scheduled to see ENT next month.   OV 09/13/21 She remains on slow prednisone taper and is down to 20mg  daily along with atovaquone prophylaxis. She saw Dr. Benjamine Mola of rheumatology on 4/17 with no changes to her regimen.   She continues to feel well and continues her weekly exercise and strength regimen. She feels her stamina continues to improve.  OV 08/20/21 She continues to do well since last visit and her stamina is increasing and her balance is getting better. She is getting out to walk in the local parks. She is currently taking 25mg  prednisone daily and atovaquone daily.   OV 07/23/21 She continues on imuran therapy 100mg  daily which has been increased from 50mg  daily by Dr. Benjamine Mola her rheumatologist, note from 07/09/21 reviewed. She has started her prednisone taper and is currently taking 30mg  daily. She remains on atovaquone prophylaxis. Ultimately she is being treated for concern of rheumatoid arthritis without articular  involvement.  She has done well since last visit. No increase in shortness of breath. She continues to progress with her physical activity.  OV 06/25/21 She has been doing well since last visit. She was started on imuran per Dr. Benjamine Mola and she has follow up with him on 07/09/21. She remains on 40mg  of prednisone daily. She is working with home PT with good results. She reports her daily activities are increasing as she is now able to vacuum 2 rooms in her home compared to 1 previously.   Chest radiograph today shows chronic left pleural effusion, stable from last month.  OV 05/22/21 She was evaluated by Dr. 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 Anti-smooth muscle anitbody is elevated. Cryoglobulins are negative.   Discussed pleural fluid studies further with Dr. Vic Ripper which showed mixed granulocytes and histioctyes along with some multinucleated giant cells. A definite diagnosis can't be placed on these findings, but he did raise concern for rheumatoid arthritis.   These upates were shared with the patient, her husband and friend.   Chest x-ray today shows persistent left pleural effusion and clear right lung.  Patient is feeling much better after increasing her prednisone back to 40mg  daily. She remains on prophylactic antibiotics. Her shoulder pain has also resolved with the prednisone.   She continues to work with PT each week and feels her conditioning in improving.  OV 04/24/21 She was seen in acute visit on 11/28 by Derl Barrow, NP for increasing shortness of breath and pleuritic chest pain. She had tapered down to 5mg  of prednisone at this time. She had repeat thoracentesis on 04/08/21 based on CT chest from 04/22/21 which continued to show persistent left pleural effusion. Post-thoracentesis x-ray showed left pleural effusion and small right pleural effusion. Chest x-ray on 11/28 shows increased left pleural effusion.   Patient  complains of increasing shortness of breath, bilateral shoulder pain and increasing left-sided discomfort.  Labs from visit on 11/16 showed rheumatoid factor I30 previously 57.2 on 01/21/2021.  P ANCA titer was 1:80 on 04/10/2021.  The following labs were negative on 04/10/2021: ANA, anti-CCP, dsDNA antibody and previously negative for SSA and SSB and antihistone antibodies.  OV 03/06/21 She was admitted 8/27 to 9/7 for pericardial effusion and bilateral pleural effusions. She is status post pericardial drain and multiple thoracenteses. The pleural fluid studies were consistent with exudative process which were initially neutrophil predominant that later became lymphocyte predominant. Cytology was negative for malignancy but showed reactive mesothelial cells.  She is being treated for serositis with prednisone taper and colchicine for the pericardial effusion. She has been on 40mg  of prednisone over the past month. She has been on pneumocystis prophylaxis with atovaquone, she developed skin rash with bactrim. Her inflammatory workup was rather unrevealing as ANA, CCP anti-histone ab and SSA/SSB were negative. Her rheumatoid factor was elevated at 57.2.   She is feeling significantly better since discharge is resuming her normal activity and is quite satisfied with her recovering thus far.   Chest radiograph today shows opacities at the left lung base. The right lung is significantly improved since prior study with resolution of right pleural effusion.  Past Medical History:  Diagnosis Date   A-fib [facility])    Allergy    Atrial fibrillation (HCC)    FH: cholecystectomy 1997   Macular degeneration    OSA (obstructive sleep apnea)    mild with total AHI 7.25/hr and severe during REM sleep at 35/hr now on CPAP at 6cm H2O     Family History  Problem Relation Age of Onset   Osteoporosis Mother    Macular degeneration Mother    Heart Problems Father    Stroke Father    Diabetes Brother     Kidney failure Brother    Cancer Maternal Grandmother    Dementia Paternal Grandmother    Suicidality Other    Heart attack Neg Hx      Social History   Socioeconomic History   Marital status: Married    Spouse name: Not on file   Number of children: Not on file   Years of education: Not on file   Highest education level: Not on file  Occupational History   Not on file  Tobacco Use   Smoking status: Never   Smokeless tobacco: Never  Vaping Use   Vaping Use: Never used  Substance and Sexual Activity   Alcohol use: Never   Drug use: Never   Sexual activity: Not on file  Other Topics Concern   Not on file  Social History Narrative   ** Merged History Encounter **  Social Determinants of Health   Financial Resource Strain: Not on file  Food Insecurity: Not on file  Transportation Needs: Not on file  Physical Activity: Not on file  Stress: Not on file  Social Connections: Not on file  Intimate Partner Violence: Not on file     Allergies  Allergen Reactions   Erythromycin Other (See Comments)    Other reaction(s): burning   Sulfa Antibiotics     Other reaction(s): Unknown   Azithromycin Other (See Comments)    Pain in arm with IV infusion    Condrolite [Glucosamine-Chondroitin-Msm] Rash   Glucosamine Rash    Other reaction(s): rash Other reaction(s): rash   Sulfamethoxazole-Trimethoprim Rash    Other reaction(s): rash Other reaction(s): rash     Outpatient Medications Prior to Visit  Medication Sig Dispense Refill   atenolol (TENORMIN) 25 MG tablet Take half tablet by mouth daily as needed for heart rate >110 45 tablet 3   CALCIUM CITRATE-VITAMIN D PO Take 1 tablet by mouth at bedtime.     carboxymethylcellulose (REFRESH PLUS) 0.5 % SOLN Place 1 drop into both eyes 2 (two) times daily.     ELIQUIS 5 MG TABS tablet TAKE 1 TABLET BY MOUTH TWICE A DAY 60 tablet 6   EPIPEN 2-PAK 0.3 MG/0.3ML SOAJ injection Inject 0.3 mg into the muscle once as needed for  anaphylaxis (severe allergic reaction).  0   famotidine (PEPCID) 10 MG tablet Take 1-2 tablets by mouth twice daily as needed 60 tablet 6   folic acid (FOLVITE) 1 MG tablet Take 1 tablet (1 mg total) by mouth daily. 90 tablet 3   furosemide (LASIX) 20 MG tablet Take 1 tablet (20 mg total) by mouth daily. 90 tablet 3   methotrexate 50 MG/2ML injection Inject 0.5 mLs (12.5 mg total) into the skin once a week. 4 mL 1   Multiple Vitamin (MULTIVITAMIN WITH MINERALS) TABS tablet Take 1 tablet by mouth every morning.     Multiple Vitamins-Minerals (PRESERVISION AREDS PO) Take 1 capsule by mouth 2 (two) times daily.     potassium chloride (KLOR-CON) 20 MEQ packet USE 1 PACKET ONCE DAILY 90 packet 2   predniSONE (DELTASONE) 10 MG tablet Take 2 tablets (20 mg total) by mouth daily with breakfast. (Patient taking differently: Take 7.5 mg by mouth daily with breakfast.) 60 tablet 3   predniSONE (DELTASONE) 5 MG tablet Take 1 tablet (5 mg total) by mouth daily with breakfast. (Patient taking differently: Take 7.5 mg by mouth daily with breakfast.) 30 tablet 1   TUBERCULIN SYR 1CC/26GX3/8" 26G X 3/8" 1 ML MISC Use for methotrexate injection once weekly 25 each 0   No facility-administered medications prior to visit.   Review of Systems  Constitutional:  Negative for chills, fever, malaise/fatigue and weight loss.  HENT:  Negative for congestion, sinus pain and sore throat.   Eyes: Negative.   Respiratory:  Positive for shortness of breath (with exertion). Negative for cough, hemoptysis, sputum production and wheezing.   Cardiovascular:  Negative for chest pain, palpitations, orthopnea, claudication and leg swelling.  Gastrointestinal:  Negative for abdominal pain, heartburn, nausea and vomiting.  Genitourinary: Negative.   Musculoskeletal:  Negative for joint pain and myalgias.  Skin:  Negative for rash.  Neurological:  Negative for weakness.  Endo/Heme/Allergies:  Bruises/bleeds easily.   Psychiatric/Behavioral: Negative.     Objective:   Vitals:   07/01/22 0957  BP: 130/80  Pulse: (!) 114  SpO2: 98%  Weight: 165 lb 6.4 oz (  75 kg)  Height: 5\' 2"  (1.575 m)    Physical Exam Constitutional:      General: She is not in acute distress.    Appearance: She is not ill-appearing.  HENT:     Head: Normocephalic and atraumatic.  Cardiovascular:     Rate and Rhythm: Normal rate and regular rhythm.     Pulses: Normal pulses.     Heart sounds: Normal heart sounds. No murmur heard. Pulmonary:     Effort: Pulmonary effort is normal.     Breath sounds: Examination of the left-middle field reveals decreased breath sounds. Examination of the left-lower field reveals decreased breath sounds. Decreased breath sounds present. No wheezing, rhonchi or rales.  Musculoskeletal:     Right lower leg: No edema.     Left lower leg: No edema.  Skin:    General: Skin is warm and dry.     Findings: Bruising (on hands b/l) present.  Neurological:     General: No focal deficit present.     Mental Status: She is alert.  Psychiatric:        Mood and Affect: Mood normal.        Behavior: Behavior normal.        Thought Content: Thought content normal.        Judgment: Judgment normal.    CBC    Component Value Date/Time   WBC 10.1 05/01/2022 0933   RBC 4.52 05/01/2022 0933   HGB 14.3 05/01/2022 0933   HGB 15.7 12/27/2018 0831   HCT 42.8 05/01/2022 0933   HCT 47.4 (H) 12/27/2018 0831   PLT 212.0 05/01/2022 0933   PLT 213 12/27/2018 0831   MCV 94.5 05/01/2022 0933   MCV 90 12/27/2018 0831   MCH 32.1 03/03/2022 1404   MCHC 33.5 05/01/2022 0933   RDW 17.7 (H) 05/01/2022 0933   RDW 13.1 12/27/2018 0831   LYMPHSABS 0.4 (L) 05/01/2022 0933   MONOABS 0.7 05/01/2022 0933   EOSABS 0.0 05/01/2022 0933   BASOSABS 0.0 05/01/2022 0933      Latest Ref Rng & Units 05/01/2022    9:33 AM 03/03/2022    2:04 PM 12/30/2021    2:05 PM  BMP  Glucose 70 - 99 mg/dL 111  105  108   BUN 6 - 23  mg/dL 12  18  16    Creatinine 0.40 - 1.20 mg/dL 0.73  0.74  0.86   BUN/Creat Ratio 6 - 22 (calc)  SEE NOTE:  SEE NOTE:   Sodium 135 - 145 mEq/L 133  138  135   Potassium 3.5 - 5.1 mEq/L 4.2  4.7  4.9   Chloride 96 - 112 mEq/L 95  100  99   CO2 19 - 32 mEq/L 33  31  31   Calcium 8.4 - 10.5 mg/dL 9.1  9.4  9.5    Chest imaging: CXR 07/01/22 Moderate to large left pleural effusion, increased in size when compared with prior exam.  CXR 03/17/22 Unchanged left-sided pleural effusion with associated atelectasis/consolidation  CXR 12/06/21 Small left pleural effusion, decreased in size when compared with prior exam. Similar trace right pleural effusion.  CXR 11/25/21 1. Increased LEFT pleural effusion, now moderate to large in size. 2. Probable small RIGHT pleural effusion.  CXR 10/22/21 Stable left effusion. No blunting of the right costophrenic angle.  CXR 09/27/21 Mild-to-moderate cardiomegaly. Again seen is the obscuration of the left cardiac border and hemidiaphragm of pleural effusion with likely superimposed atelectasis/consolidation without significant interval change.  There is some blunting of the right CP angle seen of likely small pleural effusion  CXR 08/20/21 Similar appearance of the chest x-ray with left-sided pleural effusion and associated atelectasis/consolidation  CXR 06/25/21 1. Stable small left pleural effusion. 2. No new focal lung infiltrate.  CXR 05/22/21 There is a small to medium size left pleural effusion, not significantly changed since 04/24/2021. Aeration of the left lung is also not significantly changed. The previously seen small right effusion has resolved. The right lung is now clear. There is no pneumothorax.  CXR 04/22/21 Increasing LEFT effusion and associated airspace disease. Correlate with any signs of infection. Given persistence/worsening underlying pulmonary or bronchial lesion would be difficult to exclude. Correlate with results of  prior thoracentesis and consider follow-up chest CT for further evaluation as warranted.   Trace RIGHT effusion with basilar atelectasis.  CXR 03/06/21 Resolved right-sided pleural effusion with persistent opacity on the left, likely a combination of persisting pleural fluid and associated atelectasis/consolidation  CXR 01/30/21 1. Slight interval decrease in size of the right pleural effusion with improved aeration of the right base. 2. No significant interval change of the larger left pleural effusion and adjacent airspace disease.  CT Chest 01/03/21 Moderate left and small right pleural effusions, which is increased on the left and new on the right in comparison to prior CT. Adjacent bibasilar and lingular atelectasis.   Increased, moderate-sized pericardial effusion. Biatrial enlargement.   Mild pulmonary edema.  PFT:     No data to display          Labs: 01/21/21: Quantiferon gold indeterminate  Path:  Echo 01/20/21:  1. Left ventricular ejection fraction, by estimation, is 60 to 65%. The  left ventricle has normal function. The left ventricle has no regional  wall motion abnormalities.   2. A small pericardial effusion is present. There is no evidence of  cardiac tamponade.  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  She is to taper to 5mg  of prednisone until next follow up visit. She continues on methotrexate IM for her RA. She had steroid injection of right shoulder which helped quite a bit.   We discussed possible repeat thoracentesis to see if she has benefit with decrease in dyspnea. If so, we will consider pleurodesis procedure to help with eliminating that pleural  effusion.   There are case  reports of tocilizumab being used for persistent pleural effusion in lupus/RA with success. Will discuss with Dr. Benjamine Mola.  Follow up in 1 month  Freda Jackson, MD [facility] Office: [phone]   Current Outpatient Medications:    atenolol (TENORMIN) 25 MG tablet, Take half tablet by mouth daily as needed for heart rate >110, Disp: 45 tablet, Rfl: 3   CALCIUM CITRATE-VITAMIN D PO, Take 1 tablet by mouth at bedtime., Disp: , Rfl:    carboxymethylcellulose (REFRESH PLUS) 0.5 % SOLN, Place 1 drop into both eyes 2 (two) times daily., Disp: , Rfl:    ELIQUIS 5 MG TABS tablet, TAKE 1 TABLET BY MOUTH TWICE A DAY, Disp: 60 tablet, Rfl: 6   EPIPEN 2-PAK 0.3 MG/0.3ML SOAJ injection, Inject 0.3 mg into the muscle once as needed for anaphylaxis (severe allergic reaction).,  Disp: , Rfl: 0   famotidine (PEPCID) 10 MG tablet, Take 1-2 tablets by mouth twice daily as needed, Disp: 60 tablet, Rfl: 6   folic acid (FOLVITE) 1 MG tablet, Take 1 tablet (1 mg total) by mouth daily., Disp: 90 tablet, Rfl: 3   furosemide (LASIX) 20 MG tablet, Take 1 tablet (20 mg total) by mouth daily., Disp: 90 tablet, Rfl: 3   methotrexate 50 MG/2ML injection, Inject 0.5 mLs (12.5 mg total) into the skin once a week., Disp: 4 mL, Rfl: 1   Multiple Vitamin (MULTIVITAMIN WITH MINERALS) TABS tablet, Take 1 tablet by mouth every morning., Disp: , Rfl:    Multiple Vitamins-Minerals (PRESERVISION AREDS PO), Take 1 capsule by mouth 2 (two) times daily., Disp: , Rfl:    potassium chloride (KLOR-CON) 20 MEQ packet, USE 1 PACKET ONCE DAILY, Disp: 90 packet, Rfl: 2   predniSONE (DELTASONE) 10 MG tablet, Take 2 tablets (20 mg total) by mouth daily with breakfast. (Patient taking differently: Take 7.5 mg by mouth daily with breakfast.), Disp: 60 tablet, Rfl: 3   predniSONE (DELTASONE) 5 MG tablet, Take 1 tablet (5 mg total) by mouth daily with breakfast. (Patient taking differently: Take 7.5  mg by mouth daily with breakfast.), Disp: 30 tablet, Rfl: 1   TUBERCULIN SYR 1CC/26GX3/8" 26G X 3/8" 1 ML MISC, Use for methotrexate injection once weekly, Disp: 25 each, Rfl: 0

## 2022-07-02 ENCOUNTER — Telehealth: Payer: Self-pay | Admitting: *Deleted

## 2022-07-02 ENCOUNTER — Encounter: Payer: Self-pay | Admitting: Pulmonary Disease

## 2022-07-02 ENCOUNTER — Telehealth: Payer: Self-pay | Admitting: Pulmonary Disease

## 2022-07-02 NOTE — Telephone Encounter (Signed)
Called and spoke to patient and went over the recommendations from Dr Erin Fulling. She verbalized understanding on going down to 5mg  with the prednisone. She states she called Dr Erick Blinks office this morning about her issues so he is aware of all of this. She looks forwards to talking to Dr Erin Fulling about the future recommendations. Nothing further needed at this time

## 2022-07-02 NOTE — Telephone Encounter (Signed)
Patient states she saw Dr. Raeanne Barry yesterday. Patient states she has bee tapering on Prednisone. Patient states she has been on 7.5 mg. Patient states her pleural effusion has increased this last month. Patient wants to know if MTX is still working or if she needs to change medications. Please advise.

## 2022-07-02 NOTE — Telephone Encounter (Signed)
Spoke with patient she states after last visit with Dr. Erin Fulling he had stated Pleural effusion had stayed the safe but she states according to the radiologist he states the pleural effusion has grown. Pt states she is concerned and wonders should she have decreased her prednisone to 5mg , and also ask is the methotrexate actually working if the pleural effusion is still growing. Pt is requesting a phone call with Dr. Erin Fulling if possible to discuss all of this.  I know your on nights and we are not supposed to send messages, but pt really wants to talk with you when available.

## 2022-07-02 NOTE — Telephone Encounter (Signed)
Please let patient know that it is still ok to taper down to the 5mg  of prednisone per day. We will keep an eye on her shortness of breath. I have messaged Dr. Benjamine Mola her rheumatologist about his thoughts on her medication regimen regarding the steroids, methotrexate and possibly the actemra or tocilizumab for the RA related effusion.   I will giver her a call once I hear from Dr. Benjamine Mola.  JD

## 2022-07-04 ENCOUNTER — Telehealth: Payer: Self-pay

## 2022-07-04 NOTE — Patient Instructions (Signed)
Visit Information  Thank you for taking time to visit with me today. Please don't hesitate to contact me if I can be of assistance to you.   Following are the goals we discussed today:   Goals Addressed             This Visit's Progress    COMPLETED: Care Coordination Activities - no follow up required       Care Coordination Interventions: Provided education to patient re: care coordination services, annual wellness visit Assessed social determinant of health barriers Interventions Today    Flowsheet Row Most Recent Value  General Interventions   General Interventions Discussed/Reviewed Doctor Visits  Doctor Visits Discussed/Reviewed Annual Wellness Visits, PCP  PCP/Specialist Visits Compliance with follow-up visit     Declines any further needs          If you are experiencing a Mental Health or [place] or need someone to talk to, please call the Suicide and Crisis Lifeline: 988 call the [place] National Suicide Prevention Lifeline: 321-104-6085 or TTY: 919-540-0931 TTY 225-795-2699) to talk to a trained counselor call 1-800-273-TALK (toll free, 24 hour hotline) go to [facility] [place] [phone]) call 911   Patient verbalizes understanding of instructions and care plan provided today and agrees to view in [place]. Active MyChart status and patient understanding of how to access instructions and care plan via MyChart confirmed with patient.     No further follow up required:    Peter Garter RN, Jackquline Denmark, [facility] [phone]

## 2022-07-04 NOTE — Patient Outreach (Signed)
  Care Coordination   Initial Visit Note   07/04/2022 Name: Rebecca Short MRN: [id number] DOB: Jan 10, 1941  Rebecca Short is a 82 y.o. year old female who sees Rebecca Cruel, MD for primary care. I spoke with  Rebecca Short by phone today.  What matters to the patients health and wellness today?  No concerns today and I do not need anything at this time.    Goals Addressed             This Visit's Progress    COMPLETED: Care Coordination Activities - no follow up required       Care Coordination Interventions: Provided education to patient re: care coordination services, annual wellness visit Assessed social determinant of health barriers Interventions Today    Flowsheet Row Most Recent Value  General Interventions   General Interventions Discussed/Reviewed Doctor Visits  Doctor Visits Discussed/Reviewed Annual Wellness Visits, PCP  PCP/Specialist Visits Compliance with follow-up visit     Declines any further needs          SDOH assessments and interventions completed:  Yes  SDOH Interventions Today    Flowsheet Row Most Recent Value  SDOH Interventions   Food Insecurity Interventions Intervention Not Indicated  Housing Interventions Intervention Not Indicated  Transportation Interventions Intervention Not Indicated  Utilities Interventions Intervention Not Indicated        Care Coordination Interventions:  Yes, provided  Interventions Today    Flowsheet Row Most Recent Value  General Interventions   General Interventions Discussed/Reviewed Doctor Visits  Doctor Visits Discussed/Reviewed Annual Wellness Visits, PCP  PCP/Specialist Visits Compliance with follow-up visit       Follow up plan: No further intervention required.   Encounter Outcome:  Pt. Visit Completed  Rebecca Garter RN, BSN,CCM, CDE Care Management Coordinator [facility] [phone]

## 2022-07-08 ENCOUNTER — Other Ambulatory Visit: Payer: Self-pay | Admitting: Interventional Cardiology

## 2022-07-09 ENCOUNTER — Other Ambulatory Visit: Payer: Self-pay | Admitting: Internal Medicine

## 2022-07-15 ENCOUNTER — Other Ambulatory Visit: Payer: Self-pay | Admitting: Internal Medicine

## 2022-07-15 DIAGNOSIS — M0579 Rheumatoid arthritis with rheumatoid factor of multiple sites without organ or systems involvement: Secondary | ICD-10-CM

## 2022-07-16 NOTE — Progress Notes (Unsigned)
Cardiology Office Note   Date:  07/17/2022   ID:  Rebecca Short, Train July 20, 1940, MRN [id number]  PCP:  Lawerance Cruel, MD    No chief complaint on file.  AFib  Wt Readings from Last 3 Encounters:  07/17/22 163 lb (73.9 kg)  07/01/22 165 lb 6.4 oz (75 kg)  06/04/22 165 lb 12.8 oz (75.2 kg)       History of Present Illness: Rebecca Short is a 82 y.o. female     with a h/o PAF.  She has chronic back pain which has limited her exercise.  In the past, Managing hydration and stress have helped minimize her palpitations.     In 3/19, it was noted: "She is spending more time in atrial fibrillation now.  It will likely be more difficult to get her out of atrial fibrillation.  Given her lack of symptoms, would not pursue antiarrhythmic drug or cardioversion at this time.   She has been treated for sleep apnea with CPAP.   She went to [facility] in 2019. She was at higher elevation and she did well.  She did not ned any extra metoprolol.   She had her COVID vaccines.  THe day After the second pfizer shot, she felt some irregularity for 30 minutes and it resolved.     She got her booster and flu shot on the same day.  Had some brief palpitations but this resolved.     In 12/2020, she had pneumonia complicated by pleural effusion and pericardial effusion.  She had thoracentesis and pericardiocentesis and AFib rates were better controlled.  She was readmittted with shortness of breath and had bilateral thoracentesis.    Rate control meds were limited by low blood pressure.   She is keeping her BP and HR records diligently. She avoids crowds since she is on prednisone.    She wa sin the hospital in 11/2021 again with the pleural effusion.  Records show: "presented 7/4 for worsening shortness of breath and found to have enlarging left pleural effusion and small right pleural effusion.  She underwent chest tube placement 7/5 with removal of approximately 800 mL.  IV  Lasix yesterday morning leading to drop in blood pressure to 41/31.  She was also on metoprolol tartrate 25 mg twice daily>> now reduced to 12.5 mg twice daily.  She got total of 1 L of normal saline bolus with improved blood pressure."   Due to recurrent pleural effusion, meds for rheumatologic issues have been adjusted.    12/2021 PET scan to r/o malignancy: "No specific findings of malignancy in the neck, chest, abdomen or pelvis.   Waxing and waning pleural fluid collections now with small LEFT-sided effusion which has reaccumulated since imaging from July. No sign of RIGHT pleural effusion.   Cardiomegaly and aortic atherosclerosis.   Post cholecystectomy with stable biliary duct distension.   Aortic Atherosclerosis (ICD10-I70.0)."   She was having side effects from Imuran.  Changed to MTX.     No bleeding from the ELiquis.   In 2/24, mildly increased left sided pleural effusion.  Patinet is concerned that MTX is not working.   Reports ankle edema.    BPs at home are in the 120s/high80-low 90s.  Careful to avoid falls.   Following with Dr. Erin Fulling regarding pleural effusions.  Discussed with possibility of thoracic surgery with Dr. Kipp Brood to prevent pleural effusion.   Denies : Chest pain. Dizziness. Leg edema. Nitroglycerin use. Orthopnea.  Paroxysmal nocturnal dyspnea. Shortness of breath. Syncope.    Rare palpitations   Past Medical History:  Diagnosis Date   A-fib [facility])    Allergy    Atrial fibrillation (HCC)    FH: cholecystectomy 1997   Macular degeneration    OSA (obstructive sleep apnea)    mild with total AHI 7.25/hr and severe during REM sleep at 35/hr now on CPAP at 6cm H2O    Past Surgical History:  Procedure Laterality Date   CHEST TUBE INSERTION Left 11/27/2021   Procedure: CHEST TUBE INSERTION;  Surgeon: Freddi Starr, MD;  Location: [facility] ENDOSCOPY;  Service: Pulmonary;  Laterality: Left;  Prefer AM slot, thanks   CHOLECYSTECTOMY      PERICARDIOCENTESIS N/A 01/07/2021   Procedure: PERICARDIOCENTESIS;  Surgeon: Nelva Bush, MD;  Location: [place] CV LAB;  Service: Cardiovascular;  Laterality: N/A;   REPLACEMENT TOTAL KNEE BILATERAL     THORACENTESIS N/A 01/25/2021   Procedure: THORACENTESIS;  Surgeon: Freddi Starr, MD;  Location: [facility] ENDOSCOPY;  Service: Pulmonary;  Laterality: N/A;   THORACENTESIS N/A 04/08/2021   Procedure: THORACENTESIS;  Surgeon: Freddi Starr, MD;  Location: [facility] ENDOSCOPY;  Service: Pulmonary;  Laterality: N/A;   THORACENTESIS N/A 04/24/2021   Procedure: THORACENTESIS;  Surgeon: Juanito Doom, MD;  Location: [place];  Service: Cardiopulmonary;  Laterality: N/A;     Current Outpatient Medications  Medication Sig Dispense Refill   atenolol (TENORMIN) 25 MG tablet Take half tablet by mouth daily as needed for heart rate >110 45 tablet 3   CALCIUM CITRATE-VITAMIN D PO Take 1 tablet by mouth at bedtime.     carboxymethylcellulose (REFRESH PLUS) 0.5 % SOLN Place 1 drop into both eyes 2 (two) times daily.     ELIQUIS 5 MG TABS tablet TAKE 1 TABLET BY MOUTH TWICE A DAY 60 tablet 6   EPIPEN 2-PAK 0.3 MG/0.3ML SOAJ injection Inject 0.3 mg into the muscle once as needed for anaphylaxis (severe allergic reaction).  0   famotidine (PEPCID) 10 MG tablet Take 1-2 tablets by mouth twice daily as needed 60 tablet 6   folic acid (FOLVITE) 1 MG tablet Take 1 tablet (1 mg total) by mouth daily. 90 tablet 3   furosemide (LASIX) 20 MG tablet Take 1 tablet (20 mg total) by mouth daily. 90 tablet 3   methotrexate 50 MG/2ML injection Inject 0.5 mLs (12.5 mg total) into the skin once a week. 4 mL 1   Multiple Vitamin (MULTIVITAMIN WITH MINERALS) TABS tablet Take 1 tablet by mouth every morning.     Multiple Vitamins-Minerals (PRESERVISION AREDS PO) Take 1 capsule by mouth 2 (two) times daily.     potassium chloride (KLOR-CON) 20 MEQ packet USE 1 PACKET ONCE DAILY 90 packet 2   predniSONE (DELTASONE) 5  MG tablet Take 1 tablet (5 mg total) by mouth daily with breakfast. 30 tablet 1   TUBERCULIN SYR 1CC/26GX3/8" 26G X 3/8" 1 ML MISC Use for methotrexate injection once weekly 25 each 0   No current facility-administered medications for this visit.    Allergies:   Erythromycin, Azathioprine, Sulfa antibiotics, Azithromycin, Condrolite [glucosamine-chondroitin-msm], Glucosamine, Glucosamine sulfate-msm, and Sulfamethoxazole-trimethoprim    Social History:  The patient  reports that she has never smoked. She has never used smokeless tobacco. She reports that she does not drink alcohol and does not use drugs.   Family History:  The patient's family history includes Cancer in her maternal grandmother; Dementia in her paternal grandmother; Diabetes in her brother;  Heart Problems in her father; Kidney failure in her brother; Macular degeneration in her mother; Osteoporosis in her mother; Stroke in her father; Suicidality in an other family member.    ROS:  Please see the history of present illness.   Otherwise, review of systems are positive for shortness of breath.   All other systems are reviewed and negative.    PHYSICAL EXAM: VS:  BP 126/68   Pulse (!) 115   Ht 5' 2"$  (1.575 m)   Wt 163 lb (73.9 kg)   SpO2 97%   BMI 29.81 kg/m  , BMI Body mass index is 29.81 kg/m. GEN: Well nourished, well developed, in no acute distress HEENT: normal Neck: no JVD, carotid bruits, or masses Cardiac: irregular; no murmurs, rubs, or gallops; bilateral ankle edema  Respiratory:  clear to auscultation bilaterally, normal work of breathing GI: soft, nontender, nondistended, + BS MS: no deformity or atrophy Skin: warm and dry, no rash Neuro:  Strength and sensation are intact Psych: euthymic mood, full affect   EKG:   The ekg ordered today demonstrates AFib, RVR   Recent Labs: 11/26/2021: B Natriuretic Peptide 235.7; Magnesium 2.2 05/01/2022: ALT 24; BUN 12; Creatinine, Ser 0.73; Hemoglobin 14.3;  Platelets 212.0; Potassium 4.2; Pro B Natriuretic peptide (BNP) 164.0; Sodium 133   Lipid Panel    Component Value Date/Time   CHOL 145 08/19/2017 0816   TRIG 101 08/19/2017 0816   HDL 47 08/19/2017 0816   CHOLHDL 3.1 08/19/2017 0816   LDLCALC 78 08/19/2017 0816     Other studies Reviewed: Additional studies/ records that were reviewed today with results demonstrating: labs.   ASSESSMENT AND PLAN:  Atrial fibrillation: Rate control medicines have been limited by hypotension.  EF 55-60%.  Eliquis for stroke prevention.  Taking atenolol as needed.  No palpitations.   Anticoagulated/acquired thrombophilia: No internal bleeding but some nuisance bruising.  Worse with prednisone.  Obstructive sleep apnea: Followed by Dr. Radford Pax. Recurrent pleural effusion: No malignancy by PET scan. Obesity: Eating healthy.  Activity limited.  Using walker.  Aortic atherosclerosis: Considering using a statin in the presence of aortic atherosclerosis.  LDL in 2021 was 82. Had a negative cath in 2010.  Check a fasting lipid profile.  For now, will hold of on statin per our discussion.  She has a healthy lifestyle.    Current medicines are reviewed at length with the patient today.  The patient concerns regarding her medicines were addressed.  The following changes have been made:  No change  Labs/ tests ordered today include:  No orders of the defined types were placed in this encounter.   Recommend 150 minutes/week of aerobic exercise Low fat, low carb, high fiber diet recommended  Disposition:   FU in 6 months   Signed, Larae Grooms, MD  07/17/2022 10:51 AM    [facility] [address], [place], [place]  [address] Phone: [phone]; Fax: [phone]

## 2022-07-17 ENCOUNTER — Encounter: Payer: Self-pay | Admitting: Interventional Cardiology

## 2022-07-17 ENCOUNTER — Ambulatory Visit: Payer: Medicare Other | Attending: Interventional Cardiology | Admitting: Interventional Cardiology

## 2022-07-17 VITALS — BP 126/68 | HR 115 | Ht 62.0 in | Wt 163.0 lb

## 2022-07-17 DIAGNOSIS — Z7901 Long term (current) use of anticoagulants: Secondary | ICD-10-CM | POA: Diagnosis present

## 2022-07-17 DIAGNOSIS — E669 Obesity, unspecified: Secondary | ICD-10-CM | POA: Diagnosis present

## 2022-07-17 DIAGNOSIS — I4821 Permanent atrial fibrillation: Secondary | ICD-10-CM | POA: Diagnosis present

## 2022-07-17 DIAGNOSIS — G4733 Obstructive sleep apnea (adult) (pediatric): Secondary | ICD-10-CM

## 2022-07-17 DIAGNOSIS — D6869 Other thrombophilia: Secondary | ICD-10-CM | POA: Diagnosis present

## 2022-07-17 NOTE — Addendum Note (Signed)
Addended by: Bernestine Amass on: 07/17/2022 11:46 AM   Modules accepted: Orders

## 2022-07-17 NOTE — Patient Instructions (Signed)
Medication Instructions:  Your physician recommends that you continue on your current medications as directed. Please refer to the Current Medication list given to you today.  *If you need a refill on your cardiac medications before your next appointment, please call your pharmacy*  Follow-Up: At [facility], you and your health needs are our priority.  As part of our continuing mission to provide you with exceptional heart care, we have created designated Provider Care Teams.  These Care Teams include your primary Cardiologist (physician) and Advanced Practice Providers (APPs -  Physician Assistants and Nurse Practitioners) who all work together to provide you with the care you need, when you need it.  Your next appointment:   6 month(s)  Provider:   Larae Grooms, MD  or Nicholes Rough, PA-C, Melina Copa, PA-C, Ambrose Pancoast, NP, Christen Bame, NP, or Richardson Dopp, PA-C      {If

## 2022-07-23 ENCOUNTER — Ambulatory Visit: Payer: Medicare Other | Admitting: Cardiology

## 2022-07-23 ENCOUNTER — Telehealth: Payer: Self-pay | Admitting: Pulmonary Disease

## 2022-07-23 NOTE — Telephone Encounter (Signed)
Spoke with patient. She states she has had a muscle spasm in her back on the left side near here lung for the last couple of days. She says its starting to improve some today but wants to know is the spam and the pleural effusion related and is so is this something she should be concerned with.   Dr. Erin Fulling please advise?

## 2022-07-23 NOTE — Telephone Encounter (Signed)
Patient would like the nurse to call her because she said she has some clinical questions that she wants to ask.  Please advise and call patient at [phone]

## 2022-07-24 ENCOUNTER — Other Ambulatory Visit: Payer: Medicare Other

## 2022-07-24 NOTE — Telephone Encounter (Signed)
ATC patient. LVMTCB. 

## 2022-07-24 NOTE — Telephone Encounter (Signed)
The back spasm is not likely related to the pleural effusion if it is starting to let up.  Thanks, JD

## 2022-07-24 NOTE — Telephone Encounter (Signed)
Patient is returning a call.  She will be at this number until about 1:30, [phone].  After 1:30, please call at [phone]

## 2022-07-25 ENCOUNTER — Other Ambulatory Visit: Payer: Self-pay | Admitting: Pulmonary Disease

## 2022-07-25 DIAGNOSIS — J90 Pleural effusion, not elsewhere classified: Secondary | ICD-10-CM

## 2022-07-25 NOTE — Telephone Encounter (Signed)
PT caled back. Read note from Dr. Keturah Barre. Pt expressed understanding. Nothing more needed.

## 2022-07-31 ENCOUNTER — Ambulatory Visit (INDEPENDENT_AMBULATORY_CARE_PROVIDER_SITE_OTHER): Payer: Medicare Other | Admitting: Pulmonary Disease

## 2022-07-31 ENCOUNTER — Encounter: Payer: Self-pay | Admitting: Pulmonary Disease

## 2022-07-31 ENCOUNTER — Ambulatory Visit (INDEPENDENT_AMBULATORY_CARE_PROVIDER_SITE_OTHER): Payer: Medicare Other

## 2022-07-31 VITALS — BP 118/70 | HR 102 | Ht 62.0 in | Wt 166.8 lb

## 2022-07-31 DIAGNOSIS — J90 Pleural effusion, not elsewhere classified: Secondary | ICD-10-CM

## 2022-07-31 DIAGNOSIS — K658 Other peritonitis: Secondary | ICD-10-CM

## 2022-07-31 DIAGNOSIS — R768 Other specified abnormal immunological findings in serum: Secondary | ICD-10-CM | POA: Diagnosis not present

## 2022-07-31 NOTE — Progress Notes (Signed)
Synopsis: Referred in October 2022 for hospital follow up for pleural effusions  Subjective:   PATIENT ID: Rebecca Short GENDER: female DOB: 02-22-1941, MRN: [id number]  HPI  Chief Complaint  Patient presents with   Follow-up    1 mo f/u. Per patient, she is concerned about the last CXR report. Denies any increased SOB or chest pain. On prednisone 5mg  daily.    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   She has done well on the 5mg  of prednisone daily. No increased shortness of breath since last visit.   Chest radiograph done today shows stable left effusion.  OV 07/01/22 She has done well with tapering to 7.5mg  of prednisone since last visit. No increase in shortness of breath. She has been transitioned to IM methotrexate from PO and is tolerating it well.   Chest x-ray today shows slightly increased left effusion compared to last month.   OV 05/29/22 She was treated with Zpak at last visit for possible lower respiratory infection based on chest radiograph with concern for right medial lower lobe infiltrate.   CRP and ESR were not elevated at last visit. Chest radiograph was stable.  She has not been doing her workout regimen as she has not felt like it. She has had issues with hypotension when taking her metoprolol. This has been held. She has felt like this over the past 2 weeks. Denies fevers, chills or sweats. Her appetite is down. She has noticed right shoulder discomfort intermittently.  OV 05/01/22 She was tapered to 10mg  daily at last visit on 11/20. She reports not feeling well since Monday with increased dyspnea, reduced appetite and changes in her blood pressure. She was not able to eat her cereal on Monday due to lack of appetite. She reports low BP readings with  sBP in the 80s. Her metoprolol dose was decreased in half with improvement in her blood pressure but her baseline HR is 90s-100s. Her appetite has since improved. She does report lower extremity edema, right > left. Denies fevers, chills, sweats, nausea or vomiting.   OV 04/14/22 She was tapered to 12.5mg  of prednisone at last visit. She has been doing well since last visit. She continues methotrexate weekly.  OV 03/17/22 She was seen by Dr. Benjamine Mola 03/03/22. She has continued on methotrexate 15mg  weekly with folic acid. She continues ton 15mg  of prednisone daily.  She has been feeling fine since last visit. Repeat Chest x-ray shows persistent left pleural effusion.   OV 01/21/22 She started methotrexate therapy 7/31 and is currently on 15mg  weekly with folic acid 1mg  daily. She has continued on 15mg  prednisone daily. Recommended 2 months of methotrexate treatment prior to tapering of her steroids.  She was evalauted by CT surgery, PET scan is negative. She was offered VATs with mechanical pleuredesis or pleurX catheter placement but has opted not to pursue these options at this time. Chest radiograph 8/11 shows mild increase in left pleural effusion.  Her symptoms remain stable at this time on the above regimen.   She expressed frustration with her health and not feeling completely better after the last year and she expresses concern with the immunosuppression side effects.   OV 12/06/21 She was tapered down to 10mg  of prednisone daily on 6/12. She reported  progressive fatigue and  shortness of breath over week on 7/3. Chest x-ray showed an increase in her left pleural effusion as well as return of a small right pleural effusion. She was admitted 7/4 and left pigtail chest tube was placed on 7/5. She drained a total of 867mL of fluid with minimal drainage there after. CT chest scan after chest tube placement showed LLL infiltrate vs atelecatsis and resolution of the effusion on the left. There was no  need for pleural lytic therapy and her lung expanded appropriately. The effusion was again exudative. Cytology was negative for malignancy.  Her prednisone was increased back to 20mg  daily an she continued on azathioprine 100mg  daily. She is feeling much better since before the hospital.  Dr. Benjamine Mola of rheumatology was notified. We discussed transition to methotrexate from azathioprine.   Her pleural fluid ADA was not elevated and the AFB smear on pleural fluid is negative. Quantiferon gold is negative.   Chest radiograph today shows return of left pleural effusion, but not as significant as her 7/3 chest x-ray. Right effusion has decreased or resolved.   OV 09/3021 She was seen by Roxan Diesel, NP on 09/27/21 for acute visit due to worsening joint pains and concern for worsening of her pleural effusion. Chest radiograph was overall stable compared to March. It was recommended she continue on the scheduled prednisone taper.   Since that visit she has not had further issues with joint pains or return of dyspnea. She overall feels well. She is currently taking 15mg  prednisone daily and has stopped atovaquone antibiotic.  She complains of ear fullness and having trouble hearing and is scheduled to see ENT next month.   OV 09/13/21 She remains on slow prednisone taper and is down to 20mg  daily along with atovaquone prophylaxis. She saw Dr. Benjamine Mola of rheumatology on 4/17 with no changes to her regimen.   She continues to feel well and continues her weekly exercise and strength regimen. She feels her stamina continues to improve.  OV 08/20/21 She continues to do well since last visit and her stamina is increasing and her balance is getting better. She is getting out to walk in the local parks. She is currently taking 25mg  prednisone daily and atovaquone daily.   OV 07/23/21 She continues on imuran therapy 100mg  daily which has been increased from 50mg  daily by Dr. Benjamine Mola her rheumatologist, note from 07/09/21  reviewed. She has started her prednisone taper and is currently taking 30mg  daily. She remains on atovaquone prophylaxis. 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  She has done well since last visit. No increase in shortness of breath. She continues to progress with her physical activity.  OV 06/25/21 She has been doing well since last visit. She was started on imuran per Dr. Benjamine Mola and she has follow up with him on 07/09/21. She remains on 40mg  of prednisone daily. She is working with home PT with good results. She reports her daily activities are increasing as she is now able to vacuum 2 rooms in her home compared to 1 previously.   Chest radiograph today shows chronic left pleural effusion, stable from last month.  OV 05/22/21 She was evaluated by Dr. 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 Anti-smooth muscle anitbody is elevated. Cryoglobulins are negative.   Discussed pleural fluid studies further with Dr. Vic Ripper which showed mixed granulocytes and histioctyes along with some multinucleated giant cells. A definite diagnosis can't be placed on  these findings, but he did raise concern for rheumatoid arthritis.   These upates were shared with the patient, her husband and friend.   Chest x-ray today shows persistent left pleural effusion and clear right lung.   Patient is feeling much better after increasing her prednisone back to 40mg  daily. She remains on prophylactic antibiotics. Her shoulder pain has also resolved with the prednisone.   She continues to work with PT each week and feels her conditioning in improving.  OV 04/24/21 She was seen in acute visit on 11/28 by Derl Barrow, NP for increasing shortness of breath and pleuritic chest pain. She had tapered down to 5mg  of prednisone at this time. She had repeat thoracentesis on 04/08/21 based on CT chest from 04/22/21 which  continued to show persistent left pleural effusion. Post-thoracentesis x-ray showed left pleural effusion and small right pleural effusion. Chest x-ray on 11/28 shows increased left pleural effusion.   Patient complains of increasing shortness of breath, bilateral shoulder pain and increasing left-sided discomfort.  Labs from visit on 11/16 showed rheumatoid factor I30 previously 57.2 on 01/21/2021.  P ANCA titer was 1:80 on 04/10/2021.  The following labs were negative on 04/10/2021: ANA, anti-CCP, dsDNA antibody and previously negative for SSA and SSB and antihistone antibodies.  OV 03/06/21 She was admitted 8/27 to 9/7 for pericardial effusion and bilateral pleural effusions. She is status post pericardial drain and multiple thoracenteses. The pleural fluid studies were consistent with exudative process which were initially neutrophil predominant that later became lymphocyte predominant. Cytology was negative for malignancy but showed reactive mesothelial cells.  She is being treated for serositis with prednisone taper and colchicine for the pericardial effusion. She has been on 40mg  of prednisone over the past month. She has been on pneumocystis prophylaxis with atovaquone, she developed skin rash with bactrim. Her inflammatory workup was rather unrevealing as ANA, CCP anti-histone ab and SSA/SSB were negative. Her rheumatoid factor was elevated at 57.2.   She is feeling significantly better since discharge is resuming her normal activity and is quite satisfied with her recovering thus far.   Chest radiograph today shows opacities at the left lung base. The right lung is significantly improved since prior study with resolution of right pleural effusion.  Past Medical History:  Diagnosis Date   A-fib [facility])    Allergy    Atrial fibrillation (HCC)    FH: cholecystectomy 1997   Macular degeneration    OSA (obstructive sleep apnea)    mild with total AHI 7.25/hr and severe during REM sleep at  35/hr now on CPAP at 6cm H2O     Family History  Problem Relation Age of Onset   Osteoporosis Mother    Macular degeneration Mother    Heart Problems Father    Stroke Father    Diabetes Brother    Kidney failure Brother    Cancer Maternal Grandmother    Dementia Paternal Grandmother    Suicidality Other    Heart attack Neg Hx      Social History   Socioeconomic History   Marital status: Married    Spouse name: Not on file   Number of children: Not on file   Years of education: Not on file   Highest education level: Not on file  Occupational History   Not on file  Tobacco Use   Smoking status: Never   Smokeless tobacco: Never  Vaping Use   Vaping Use: Never used  Substance and Sexual Activity   Alcohol use:  Never   Drug use: Never   Sexual activity: Not on file  Other Topics Concern   Not on file  Social History Narrative   ** Merged History Encounter **       Social Determinants of Health   Financial Resource Strain: Not on file  Food Insecurity: No Food Insecurity (07/04/2022)   Hunger Vital Sign    Worried About Running Out of Food in the Last Year: Never true    Ran Out of Food in the Last Year: Never true  Transportation Needs: No Transportation Needs (07/04/2022)   PRAPARE - Hydrologist (Medical): No    Lack of Transportation (Non-Medical): No  Physical Activity: Not on file  Stress: Not on file  Social Connections: Not on file  Intimate Partner Violence: Not on file     Allergies  Allergen Reactions   Erythromycin Other (See Comments)    Other reaction(s): burning   Azathioprine     Other Reaction(s): WEAKNESS,FATIGUE HAIR LOSS   Sulfa Antibiotics     Other reaction(s): Unknown   Azithromycin Other (See Comments)    Pain in arm with IV infusion    Condrolite [Glucosamine-Chondroitin-Msm] Rash   Glucosamine Rash    Other reaction(s): rash Other reaction(s): rash   Glucosamine Sulfate-Msm Rash    Sulfamethoxazole-Trimethoprim Rash    Other reaction(s): rash Other reaction(s): rash     Outpatient Medications Prior to Visit  Medication Sig Dispense Refill   atenolol (TENORMIN) 25 MG tablet Take half tablet by mouth daily as needed for heart rate >110 45 tablet 3   CALCIUM CITRATE-VITAMIN D PO Take 1 tablet by mouth at bedtime.     carboxymethylcellulose (REFRESH PLUS) 0.5 % SOLN Place 1 drop into both eyes 2 (two) times daily.     ELIQUIS 5 MG TABS tablet TAKE 1 TABLET BY MOUTH TWICE A DAY 60 tablet 6   EPIPEN 2-PAK 0.3 MG/0.3ML SOAJ injection Inject 0.3 mg into the muscle once as needed for anaphylaxis (severe allergic reaction).  0   famotidine (PEPCID) 10 MG tablet Take 1-2 tablets by mouth twice daily as needed 60 tablet 6   folic acid (FOLVITE) 1 MG tablet Take 1 tablet (1 mg total) by mouth daily. 90 tablet 3   furosemide (LASIX) 20 MG tablet Take 1 tablet (20 mg total) by mouth daily. 90 tablet 3   methotrexate 50 MG/2ML injection Inject 0.5 mLs (12.5 mg total) into the skin once a week. 4 mL 1   Multiple Vitamin (MULTIVITAMIN WITH MINERALS) TABS tablet Take 1 tablet by mouth every morning.     Multiple Vitamins-Minerals (PRESERVISION AREDS PO) Take 1 capsule by mouth 2 (two) times daily.     potassium chloride (KLOR-CON) 20 MEQ packet USE 1 PACKET ONCE DAILY 90 packet 2   predniSONE (DELTASONE) 5 MG tablet TAKE 1 TABLET BY MOUTH EVERY DAY WITH BREAKFAST 30 tablet 1   TUBERCULIN SYR 1CC/26GX3/8" 26G X 3/8" 1 ML MISC Use for methotrexate injection once weekly 25 each 0   No facility-administered medications prior to visit.   Review of Systems  Constitutional:  Negative for chills, fever, malaise/fatigue and weight loss.  HENT:  Negative for congestion, sinus pain and sore throat.   Eyes: Negative.   Respiratory:  Positive for shortness of breath (with exertion). Negative for cough, hemoptysis, sputum production and wheezing.   Cardiovascular:  Negative for chest pain,  palpitations, orthopnea, claudication and leg swelling.  Gastrointestinal:  Negative for  abdominal pain, heartburn, nausea and vomiting.  Genitourinary: Negative.   Musculoskeletal:  Negative for joint pain and myalgias.  Skin:  Negative for rash.  Neurological:  Negative for weakness.  Endo/Heme/Allergies:  Bruises/bleeds easily.  Psychiatric/Behavioral: Negative.     Objective:   Vitals:   07/31/22 0935  BP: 118/70  Pulse: (!) 102  SpO2: 98%  Weight: 166 lb 12.8 oz (75.7 kg)  Height: 5\' 2"  (1.575 m)    Physical Exam Constitutional:      General: She is not in acute distress.    Appearance: She is not ill-appearing.  HENT:     Head: Normocephalic and atraumatic.  Cardiovascular:     Rate and Rhythm: Normal rate and regular rhythm.     Pulses: Normal pulses.     Heart sounds: Normal heart sounds. No murmur heard. Pulmonary:     Effort: Pulmonary effort is normal.     Breath sounds: Examination of the left-middle field reveals decreased breath sounds. Examination of the left-lower field reveals decreased breath sounds. Decreased breath sounds present. No wheezing, rhonchi or rales.  Musculoskeletal:     Right lower leg: No edema.     Left lower leg: No edema.  Skin:    General: Skin is warm and dry.     Findings: Bruising (on hands b/l) present.  Neurological:     General: No focal deficit present.     Mental Status: She is alert.    CBC    Component Value Date/Time   WBC 10.1 05/01/2022 0933   RBC 4.52 05/01/2022 0933   HGB 14.3 05/01/2022 0933   HGB 15.7 12/27/2018 0831   HCT 42.8 05/01/2022 0933   HCT 47.4 (H) 12/27/2018 0831   PLT 212.0 05/01/2022 0933   PLT 213 12/27/2018 0831   MCV 94.5 05/01/2022 0933   MCV 90 12/27/2018 0831   MCH 32.1 03/03/2022 1404   MCHC 33.5 05/01/2022 0933   RDW 17.7 (H) 05/01/2022 0933   RDW 13.1 12/27/2018 0831   LYMPHSABS 0.4 (L) 05/01/2022 0933   MONOABS 0.7 05/01/2022 0933   EOSABS 0.0 05/01/2022 0933   BASOSABS 0.0  05/01/2022 0933      Latest Ref Rng & Units 05/01/2022    9:33 AM 03/03/2022    2:04 PM 12/30/2021    2:05 PM  BMP  Glucose 70 - 99 mg/dL 111  105  108   BUN 6 - 23 mg/dL 12  18  16    Creatinine 0.40 - 1.20 mg/dL 0.73  0.74  0.86   BUN/Creat Ratio 6 - 22 (calc)  SEE NOTE:  SEE NOTE:   Sodium 135 - 145 mEq/L 133  138  135   Potassium 3.5 - 5.1 mEq/L 4.2  4.7  4.9   Chloride 96 - 112 mEq/L 95  100  99   CO2 19 - 32 mEq/L 33  31  31   Calcium 8.4 - 10.5 mg/dL 9.1  9.4  9.5    Chest imaging: CXR 07/01/22 Moderate to large left pleural effusion, increased in size when compared with prior exam.  CXR 03/17/22 Unchanged left-sided pleural effusion with associated atelectasis/consolidation  CXR 12/06/21 Small left pleural effusion, decreased in size when compared with prior exam. Similar trace right pleural effusion.  CXR 11/25/21 1. Increased LEFT pleural effusion, now moderate to large in size. 2. Probable small RIGHT pleural effusion.  CXR 10/22/21 Stable left effusion. No blunting of the right costophrenic angle.  CXR 09/27/21 Mild-to-moderate cardiomegaly. Again seen  is the obscuration of the left cardiac border and hemidiaphragm of pleural effusion with likely superimposed atelectasis/consolidation without significant interval change. There is some blunting of the right CP angle seen of likely small pleural effusion  CXR 08/20/21 Similar appearance of the chest x-ray with left-sided pleural effusion and associated atelectasis/consolidation  CXR 06/25/21 1. Stable small left pleural effusion. 2. No new focal lung infiltrate.  CXR 05/22/21 There is a small to medium size left pleural effusion, not significantly changed since 04/24/2021. Aeration of the left lung is also not significantly changed. The previously seen small right effusion has resolved. The right lung is now clear. There is no pneumothorax.  CXR 04/22/21 Increasing LEFT effusion and associated airspace disease.  Correlate with any signs of infection. Given persistence/worsening underlying pulmonary or bronchial lesion would be difficult to exclude. Correlate with results of prior thoracentesis and consider follow-up chest CT for further evaluation as warranted.   Trace RIGHT effusion with basilar atelectasis.  CXR 03/06/21 Resolved right-sided pleural effusion with persistent opacity on the left, likely a combination of persisting pleural fluid and associated atelectasis/consolidation  CXR 01/30/21 1. Slight interval decrease in size of the right pleural effusion with improved aeration of the right base. 2. No significant interval change of the larger left pleural effusion and adjacent airspace disease.  CT Chest 01/03/21 Moderate left and small right pleural effusions, which is increased on the left and new on the right in comparison to prior CT. Adjacent bibasilar and lingular atelectasis.   Increased, moderate-sized pericardial effusion. Biatrial enlargement.   Mild pulmonary edema.  PFT:     No data to display          Labs: 01/21/21: Quantiferon gold indeterminate  Path:  Echo 01/20/21:  1. Left ventricular ejection fraction, by estimation, is 60 to 65%. The  left ventricle has normal function. The left ventricle has no regional  wall motion abnormalities.   2. A small pericardial effusion is present. There is no evidence of  cardiac tamponade.  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  She is to tcontinue 5mg  of prednisone daily. She continues on methotrexate IM for her RA. Discussed with Dr. Benjamine Mola about considering alternative agents  to help with the pleural effusion such as tocilizumab. She has follow up with Dr. Benjamine Mola next week.  We will consider reducing her dose of steroid after her visit with Dr. Benjamine Mola.  Follow up in 1 month  Freda Jackson, MD [facility] Office: [phone]   Current Outpatient Medications:    atenolol (TENORMIN) 25 MG tablet, Take half tablet by mouth daily as needed for heart rate >110, Disp: 45 tablet, Rfl: 3   CALCIUM CITRATE-VITAMIN D PO, Take 1 tablet by mouth at bedtime., Disp: , Rfl:    carboxymethylcellulose (REFRESH PLUS) 0.5 % SOLN, Place 1 drop into both eyes 2 (two) times daily., Disp: , Rfl:    ELIQUIS 5 MG TABS tablet, TAKE 1 TABLET BY MOUTH TWICE A DAY, Disp: 60 tablet, Rfl: 6   EPIPEN 2-PAK 0.3 MG/0.3ML SOAJ injection, Inject 0.3 mg into the muscle once as needed for anaphylaxis (severe allergic reaction)., Disp: , Rfl:  0   famotidine (PEPCID) 10 MG tablet, Take 1-2 tablets by mouth twice daily as needed, Disp: 60 tablet, Rfl: 6   folic acid (FOLVITE) 1 MG tablet, Take 1 tablet (1 mg total) by mouth daily., Disp: 90 tablet, Rfl: 3   furosemide (LASIX) 20 MG tablet, Take 1 tablet (20 mg total) by mouth daily., Disp: 90 tablet, Rfl: 3   methotrexate 50 MG/2ML injection, Inject 0.5 mLs (12.5 mg total) into the skin once a week., Disp: 4 mL, Rfl: 1   Multiple Vitamin (MULTIVITAMIN WITH MINERALS) TABS tablet, Take 1 tablet by mouth every morning., Disp: , Rfl:    Multiple Vitamins-Minerals (PRESERVISION AREDS PO), Take 1 capsule by mouth 2 (two) times daily., Disp: , Rfl:    potassium chloride (KLOR-CON) 20 MEQ packet, USE 1 PACKET ONCE DAILY, Disp: 90 packet, Rfl: 2   predniSONE (DELTASONE) 5 MG tablet, TAKE 1 TABLET BY MOUTH EVERY DAY WITH BREAKFAST, Disp: 30 tablet, Rfl: 1   TUBERCULIN SYR 1CC/26GX3/8" 26G X 3/8" 1 ML MISC, Use for methotrexate injection once weekly, Disp: 25 each, Rfl: 0

## 2022-07-31 NOTE — Patient Instructions (Signed)
Continue '5mg'$  of prednisone daily  We will check a chest x-ray  I will get in touch with Dr. Benjamine Mola regarding any other potential treatments for the pleural effusion such as actemra (tocilizumab)  Follow up in 1 month

## 2022-08-01 ENCOUNTER — Encounter: Payer: Self-pay | Admitting: Pulmonary Disease

## 2022-08-02 ENCOUNTER — Encounter: Payer: Self-pay | Admitting: Pulmonary Disease

## 2022-08-03 NOTE — Progress Notes (Unsigned)
Office Visit Note  Patient: Rebecca Short             Date of Birth: 1941/03/06           MRN: [id number]             PCP: Lawerance Cruel, MD Referring: Lawerance Cruel, MD Visit Date: 08/04/2022   Subjective:  No chief complaint on file.   History of Present Illness: Rebecca Short is a 82 y.o. female here for follow up ***   Previous HPI 06/04/22 Rebecca Short is a 82 y.o. 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 She had some exacerbation with edema and dyspnea but stable pleural effusion on imaging. Labs with slightly worsening of lymphopenia last month as well.  She is noticing what she thinks is a bit more nausea and fatigue type side effects after the methotrexate weekly dose than before taking up to a few days to fully recover rather than less than 1.  Since tapering the steroid dose down to 7.5 mg about a week ago she is experiencing increased in shoulder pain and stiffness on both sides.     Previous HPI 03/03/22 Rebecca Short is a 82 y.o. 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 So far her respiratory symptoms are doing well since last visit. Her mood and energy level are very poor, and frustrated about inability to socialize and limited exertion tolerance. She reports alternating days of more and less energy, sometimes associated with the methotrexate doses on Mondays but not always. Also having some concentration and memory difficulties increased from before for a few months.   Previous HPI 12/30/2021 Rebecca Short is a 82 y.o. 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 She was hospitalized last month with recurrent pleural effusion 7/4-7/7 requiring repeat thoracocentesis. Since discharge she is back onto higher  dose prednisone and symptoms are doing okay. We stopped her azathioprine due to apparent treatment failure and she started methotrexate since 7/31 after negative hepatitis screening. She has not seen any problems with GI intolerance. She does feel somewhat unsteady or lightheaded and having to move slowly on account of this.   Previous HPI 09/09/2021 Rebecca Short is a 82 y.o. 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 She is doing well since last visit without major events. She has some mild GI irritation with the azathioprine. She has significant bruising on her hands and forearms. She saw Dr. Erin Fulling for follow up with stable pulmonary symptoms and repeat chest xray obtained 3/28 was also stable. She is tapering the prednisone of a rate about 5 mg/day dose per month currently down to 20 mg daily. She is curious about safety going to the dentist for procedures on her medications.   Previous HPI 07/09/21 Rebecca Short is a 82 y.o. female here for follow up for rheumatoid pleural effusions after starting azathioprine 50 mg last month, continuing prednisone 40 mg daily and prophylactic atovaquone. She went for repeat chest xray with Dr. Erin Fulling that looked stable 2 weeks ago. So far she denies any major side effects after starting imuran.  Previous HPI 05/08/21 Rebecca Short is a 82 y.o. female here for evaluation with recurrent pleural effusions and dyspnea with positive RF and ANCA Abs. She was hospitalized in August after progressively worsening dyspnea and hypotension with exudative pleural fluid removed from left lung space followed by pericardial drainage and initial treatment with colchicine. She subsequently underwent additional thoracentesis drainage of effusions later in August and beginning of September. Results showed neutrophil predominant fluid. Prednisone 40 mg daily was added to treatment with stable  condition. This continued for a month but tapering the prednisone resulted in more rapid re accumulation of fluid on a dose of about 20 gm daily or less. Imaging demonstrated progressive increase again in left sided effusions and repeat drainage again on 11/14 and 11/30 when down to 10 mg and 5 mg daily doses of prednisone along with colchicine. She is now continuing prednisone 40 mg with atovaquone prophylaxis and colchicine 0.6 mg daily. In addition to pulmonary symptoms she reports bilateral shoulder pain and stiffness developing around the initial onset of her dyspnea before hospitalization in August.  This improved completely on 40 mg prednisone but she noticed definite recurrence of shoulder pain symptoms when tapering down the prednisone to 10 or 5 mg/day dose.  No active complaints today since she is back on higher dose medication.  She denies any peripheral joint pain involvement did not notice any significant swelling or numbness in her upper extremities associated with the pain.   No Rheumatology ROS completed.   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   Pleural effusion 11/26/2021   Serositis ([place]) 09/27/2021   Fatigue 09/27/2021   Macular pucker, left eye 06/18/2021   High risk medication use 05/30/2021   Bilateral shoulder pain 05/08/2021   Vitamin D deficiency 05/08/2021   Abnormal ANCA test 05/08/2021   RA (rheumatoid arthritis) ([place]) 04/22/2021   S/P thoracentesis    Arterial hypotension    Pleural effusion on left    Pericardial effusion    Leukocytosis 01/03/2021   Community acquired pneumonia 01/03/2021   Atrial fibrillation ([place]) 12/24/2020   Atrial fibrillation with RVR ([place]) 12/23/2020   Musculoskeletal pain 12/23/2020   Posterior vitreous detachment of both eyes 04/30/2020   Macular pucker, right eye 11/08/2019   Exudative age-related macular degeneration of left eye with active  choroidal neovascularization ([place]) 11/08/2019   Intermediate stage nonexudative age-related macular degeneration of both eyes 11/08/2019   Anticoagulated 11/18/2016   Bradycardia 03/23/2014   OSA (obstructive sleep apnea) 01/19/2014   Obesity (BMI 30-39.9) 01/19/2014   Paroxysmal A-fib ([place]) 10/04/2013   Nonspecific abnormal unspecified cardiovascular function study 07/21/2013   Acute gastroenteritis 07/15/2013    Past Medical History:  Diagnosis Date   A-fib [facility])    Allergy    Atrial fibrillation ([facility])    FH: cholecystectomy 1997   Macular degeneration    OSA (obstructive sleep apnea)    mild with total AHI 7.25/hr and severe during REM sleep at 35/hr now on CPAP at 6cm H2O    Family History  Problem Relation Age of Onset   Osteoporosis Mother    Macular degeneration Mother    Heart Problems Father    Stroke Father    Diabetes Brother    Kidney failure Brother    Cancer Maternal Grandmother    Dementia Paternal Grandmother    Suicidality Other    Heart attack Neg Hx    Past Surgical  History:  Procedure Laterality Date   CHEST TUBE INSERTION Left 11/27/2021   Procedure: CHEST TUBE INSERTION;  Surgeon: Freddi Starr, MD;  Location: [facility] END[facility]COPY;  Service: Pulmonary;  Laterality: Left;  Prefer AM slot, thanks   CHOLECYSTECTOMY     PERICARDIOCENTESIS N/A 01/07/2021   Procedure: PERICARDIOCENTESIS;  Surgeon: Nelva Bush, MD;  Location: [facility];  Service: Cardiovascular;  Laterality: N/A;   REPLACEMENT TOTAL KNEE BILATERAL     THORACENTESIS N/A 01/25/2021   Procedure: THORACENTESIS;  Surgeon: Freddi Starr, MD;  Location: [facility] ENDOSCOPY;  Service: Pulmonary;  Laterality: N/A;   THORACENTESIS N/A 04/08/2021   Procedure: THORACENTESIS;  Surgeon: Freddi Starr, MD;  Location: [facility];  Service: Pulmonary;  Laterality: N/A;   THORACENTESIS N/A 04/24/2021   Procedure: THORACENTESIS;  Surgeon: Juanito Doom, MD;  Location: [place];  Service:  Cardiopulmonary;  Laterality: N/A;   Social History   Social History Narrative   ** Merged History Encounter **       Immunization History  Administered Date(s) Administered   Influenza, High Dose Seasonal PF 07/05/2014, 03/03/2015, 06/29/2015, 07/09/2016, 03/03/2017, 07/24/2017, 02/05/2018, 06/23/2018, 06/08/2019, 02/18/2020, 06/08/2020   Influenza, Quadrivalent, Recombinant, Inj, Pf 03/04/2019   Influenza-Unspecified 01/24/2014   PFIZER(Purple Top)SARS-COV-2 Vaccination 06/16/2019, 07/07/2019, 02/18/2020, 06/08/2020   Pneumococcal Polysaccharide-23 03/22/2015, 06/29/2015, 07/09/2016, 07/24/2017, 06/23/2018, 06/08/2019, 06/08/2020, 06/03/2021   Zoster Recombinat (Shingrix) 03/09/2017     Objective: Vital Signs: There were no vitals taken for this visit.   Physical Exam   Musculoskeletal Exam: ***  CDAI Exam: CDAI Score: -- Patient Global: --; Provider Global: -- Swollen: --; Tender: -- Joint Exam 08/04/2022   No joint exam has been documented for this visit   There is currently no information documented on the homunculus. Go to the Rheumatology activity and complete the homunculus joint exam.  Investigation: No additional findings.  Imaging: DG Chest 2 View  Result Date: 07/31/2022 CLINICAL DATA:  82 year old female with pleural effusion EXAM: CHEST - 2 VIEW COMPARISON:  07/01/2022 FINDINGS: Cardiomediastinal silhouette likely unchanged, with the left heart border obscured by lung/pleural disease. Opacity at the left lung base is unchanged from the comparison obscuring the left hemidiaphragm and the left heart border. Meniscus on the lateral view. No pneumothorax. Right lung relatively well aerated. No interlobular septal thickening. Degenerative changes spine. IMPRESSION: Unchanged appearance of the chest x-ray, with persisting left-sided pleural effusion and associated atelectasis/consolidation Electronically Signed   By: Corrie Mckusick D.O.   On: 07/31/2022 16:12     Recent Labs: Lab Results  Component Value Date   WBC 10.1 05/01/2022   HGB 14.3 05/01/2022   PLT 212.0 05/01/2022   NA 133 (L) 05/01/2022   K 4.2 05/01/2022   CL 95 (L) 05/01/2022   CO2 33 (H) 05/01/2022   GLUCOSE 111 (H) 05/01/2022   BUN 12 05/01/2022   CREATININE 0.73 05/01/2022   BILITOT 1.0 05/01/2022   ALKPHOS 51 05/01/2022   AST 26 05/01/2022   ALT 24 05/01/2022   PROT 6.2 05/01/2022   ALBUMIN 3.7 05/01/2022   CALCIUM 9.1 05/01/2022   GFRAA 90 12/27/2018   QFTBGOLDPLUS Negative 11/28/2021    Speciality Comments: No specialty comments available.  Procedures:  No procedures performed Allergies: Erythromycin, Azathioprine, Sulfa antibiotics, Azithromycin, Condrolite [glucosamine-chondroitin-msm], Glucosamine, Glucosamine sulfate-msm, and Sulfamethoxazole-trimethoprim   Assessment / Plan:     Visit Diagnoses: No diagnosis found.  ***  Orders: No orders of the defined types were placed in this encounter.  No orders of  the defined types were placed in this encounter.    Follow-Up Instructions: No follow-ups on file.   Collier Salina, MD  Note - This record has been created using [employer].  Chart creation errors have been sought, but may not always  have been located. Such creation errors do not reflect on  the standard of medical care.

## 2022-08-04 ENCOUNTER — Encounter: Payer: Self-pay | Admitting: Internal Medicine

## 2022-08-04 ENCOUNTER — Ambulatory Visit: Payer: Medicare Other | Attending: Internal Medicine | Admitting: Internal Medicine

## 2022-08-04 VITALS — BP 133/83 | HR 99 | Resp 20 | Ht 62.0 in | Wt 166.0 lb

## 2022-08-04 DIAGNOSIS — J90 Pleural effusion, not elsewhere classified: Secondary | ICD-10-CM | POA: Diagnosis present

## 2022-08-04 DIAGNOSIS — M0579 Rheumatoid arthritis with rheumatoid factor of multiple sites without organ or systems involvement: Secondary | ICD-10-CM | POA: Insufficient documentation

## 2022-08-04 DIAGNOSIS — Z79899 Other long term (current) drug therapy: Secondary | ICD-10-CM | POA: Insufficient documentation

## 2022-08-05 LAB — COMPLETE METABOLIC PANEL WITH GFR
AG Ratio: 1.9 (calc) (ref 1.0–2.5)
ALT: 14 U/L (ref 6–29)
AST: 15 U/L (ref 10–35)
Albumin: 3.7 g/dL (ref 3.6–5.1)
Alkaline phosphatase (APISO): 67 U/L (ref 37–153)
BUN: 12 mg/dL (ref 7–25)
CO2: 31 mmol/L (ref 20–32)
Calcium: 9.5 mg/dL (ref 8.6–10.4)
Chloride: 100 mmol/L (ref 98–110)
Creat: 0.7 mg/dL (ref 0.60–0.95)
Globulin: 2 g/dL (calc) (ref 1.9–3.7)
Glucose, Bld: 86 mg/dL (ref 65–99)
Potassium: 4.3 mmol/L (ref 3.5–5.3)
Sodium: 137 mmol/L (ref 135–146)
Total Bilirubin: 0.5 mg/dL (ref 0.2–1.2)
Total Protein: 5.7 g/dL — ABNORMAL LOW (ref 6.1–8.1)
eGFR: 86 mL/min/{1.73_m2} (ref >=60)

## 2022-08-05 LAB — CBC WITH DIFFERENTIAL/PLATELET
Absolute Monocytes: 562 cells/uL (ref 200–950)
Basophils Absolute: 43 cells/uL (ref 0–200)
Basophils Relative: 0.6 %
Eosinophils Absolute: 43 cells/uL (ref 15–500)
Eosinophils Relative: 0.6 %
HCT: 43.4 % (ref 35.0–45.0)
Hemoglobin: 14.4 g/dL (ref 11.7–15.5)
Lymphs Abs: 454 cells/uL — ABNORMAL LOW (ref 850–3900)
MCH: 29.6 pg (ref 27.0–33.0)
MCHC: 33.2 g/dL (ref 32.0–36.0)
MCV: 89.1 fL (ref 80.0–100.0)
MPV: 11.3 fL (ref 7.5–12.5)
Monocytes Relative: 7.8 %
Neutro Abs: 6098 cells/uL (ref 1500–7800)
Neutrophils Relative %: 84.7 %
Platelets: 259 10*3/uL (ref 140–400)
RBC: 4.87 10*6/uL (ref 3.80–5.10)
RDW: 15.4 % — ABNORMAL HIGH (ref 11.0–15.0)
Total Lymphocyte: 6.3 %
WBC: 7.2 10*3/uL (ref 3.8–10.8)

## 2022-08-05 LAB — C-REACTIVE PROTEIN: CRP: 16.2 mg/L — ABNORMAL HIGH (ref <8.0)

## 2022-08-05 LAB — SEDIMENTATION RATE: Sed Rate: 11 mm/h (ref 0–30)

## 2022-08-06 NOTE — Progress Notes (Signed)
Blood count continues to show moderately low lymphocytes but about the same for several months. Metabolic panel with no problem for continuing methotrexate. CRP is slightly increased to 16 but sedimentation rate remains normal. No specific change needed to methotrexate plan.

## 2022-08-12 ENCOUNTER — Other Ambulatory Visit: Payer: Self-pay | Admitting: Internal Medicine

## 2022-08-12 DIAGNOSIS — M0579 Rheumatoid arthritis with rheumatoid factor of multiple sites without organ or systems involvement: Secondary | ICD-10-CM

## 2022-08-12 NOTE — Telephone Encounter (Signed)
Next Visit: 10/27/2022  Last Visit: 08/04/2022  Last Fill: 06/23/2022  DX: Rheumatoid arthritis involving multiple sites with positive rheumatoid factor ([place])   Current Dose per office note 08/04/2022: methotrexate 15 mg subcu weekly   Labs: 08/04/2022 Blood count continues to show moderately low lymphocytes but about the same for several months. Metabolic panel with no problem for continuing methotrexate. CRP is slightly increased to 16 but sedimentation rate remains normal. No specific change needed to methotrexate plan.   Okay to refill Methotrexate?

## 2022-08-23 IMAGING — DX DG CHEST 2V
2 series · 2 of 2 positions shown · non-contrast
Comparison: August 20, 2021

CLINICAL DATA: pleural effusion; serositis

EXAM:
CHEST - 2 VIEW

[chest pa]
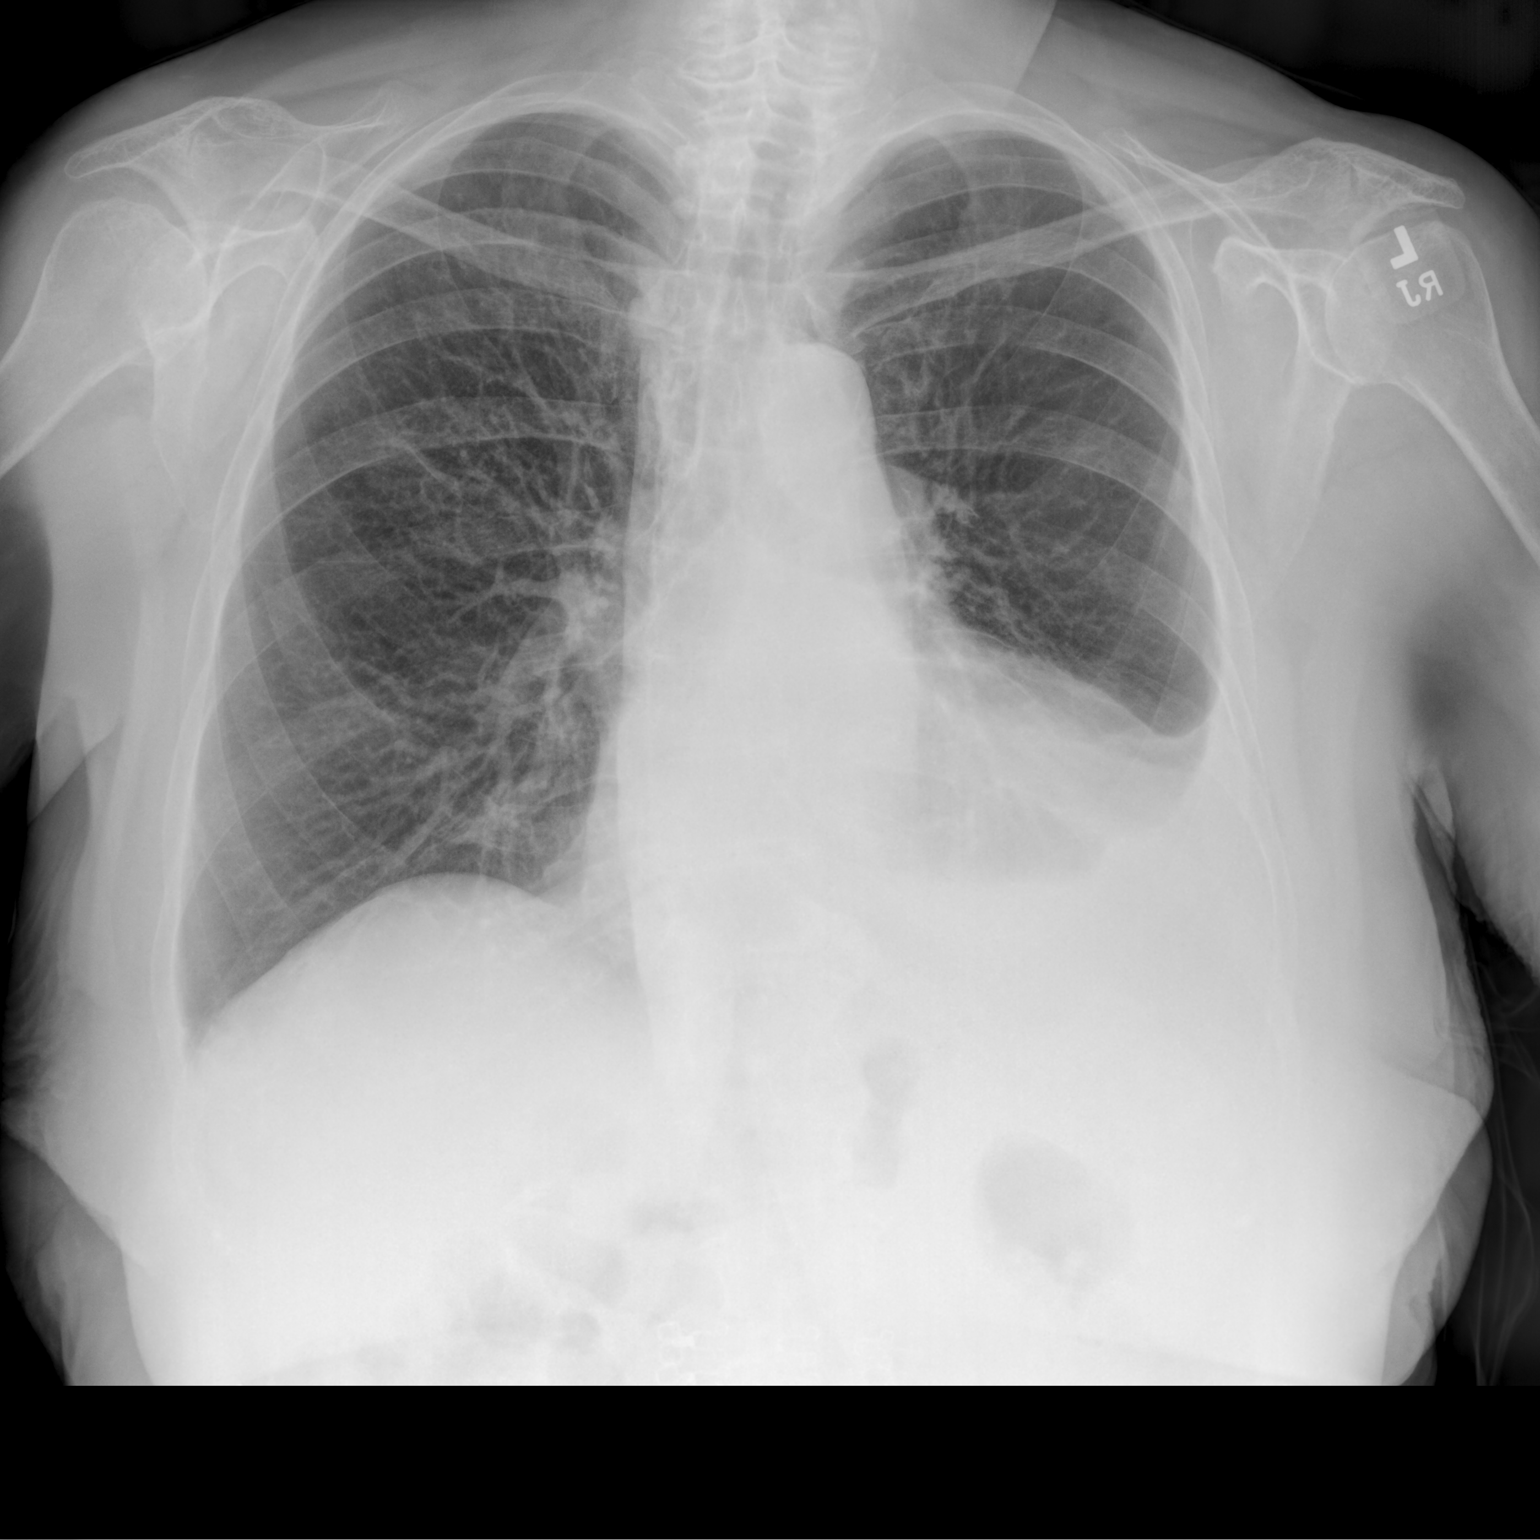

[chest lat]
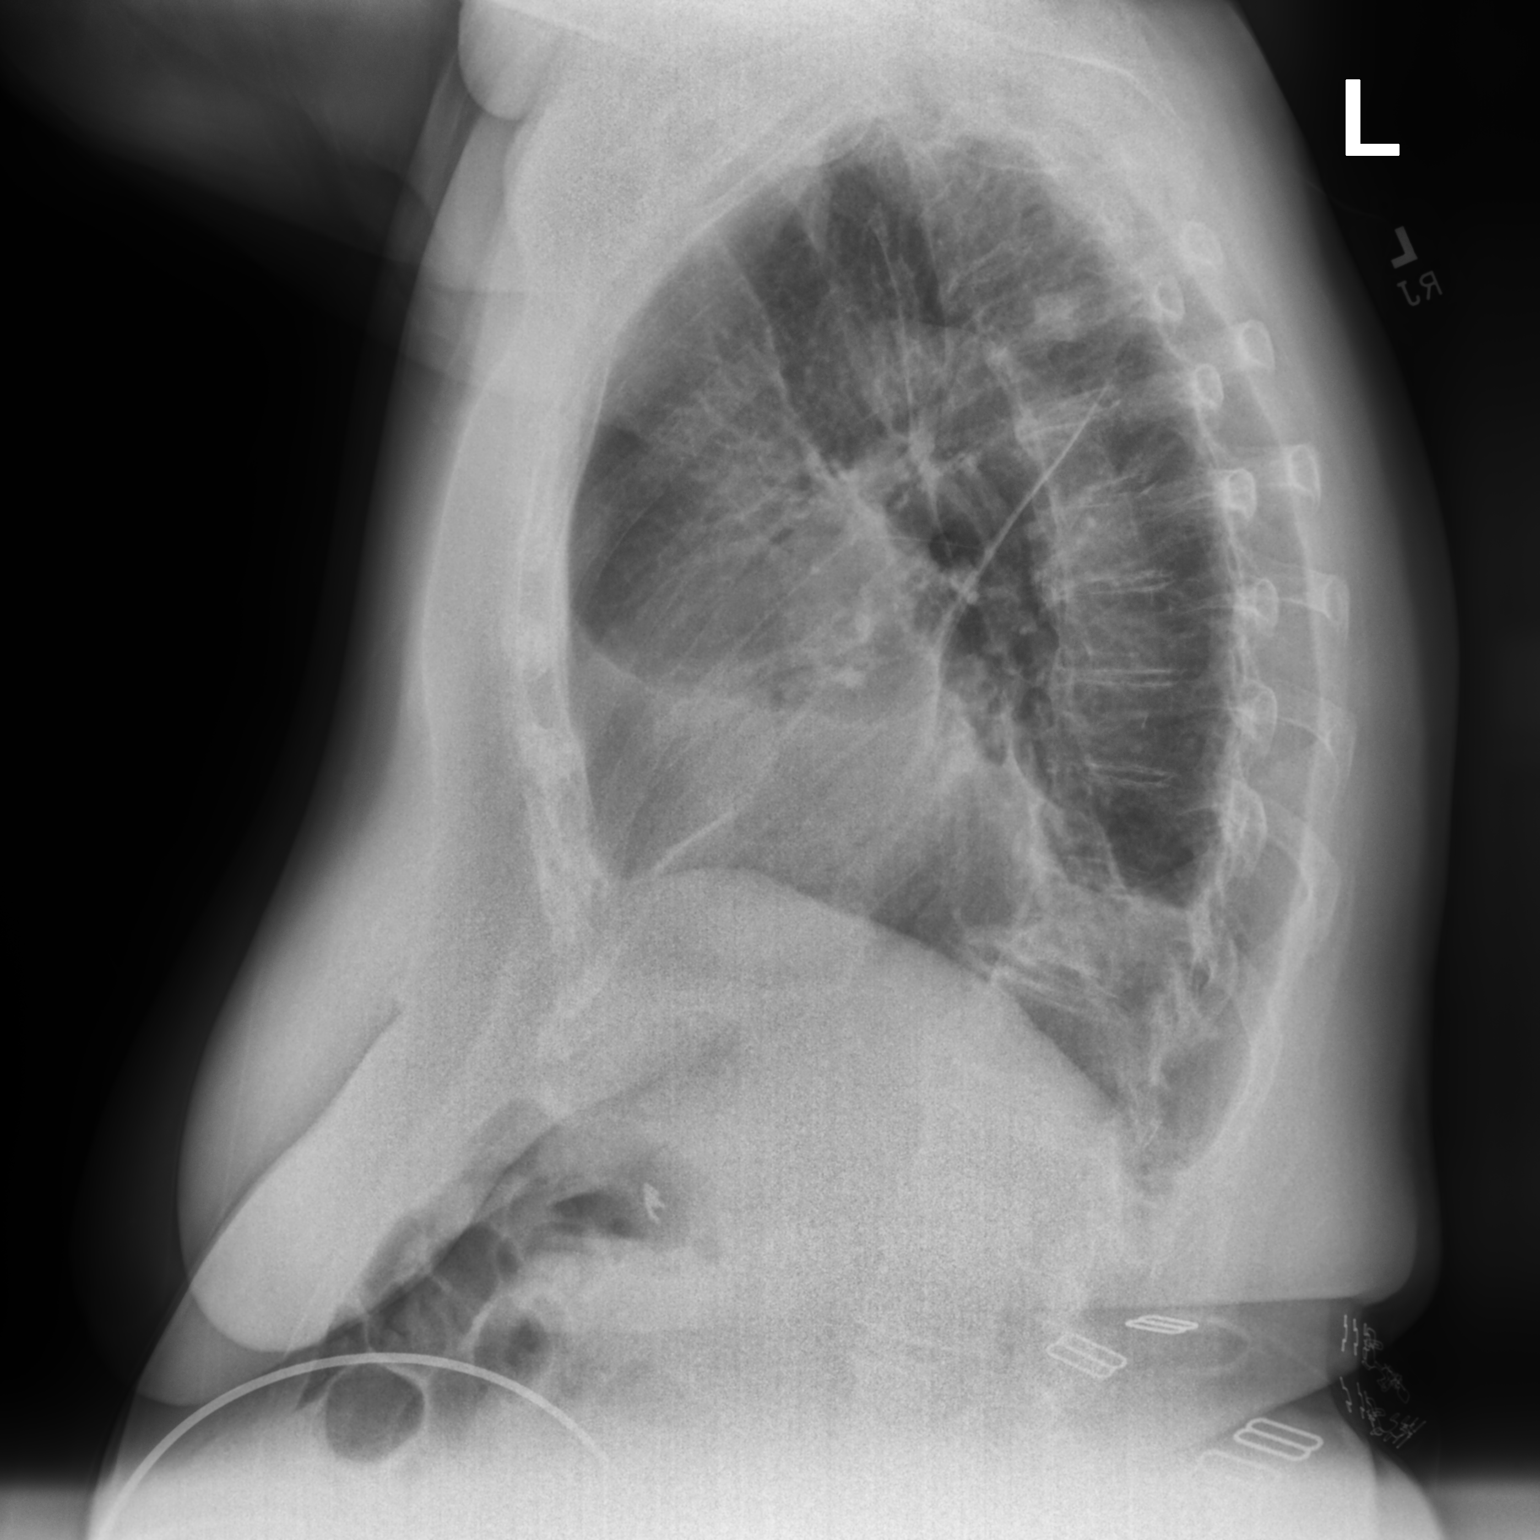

[2 of 2 positions shown; findings below may reference images not displayed]

FINDINGS: Mild-to-moderate cardiomegaly. Again seen is the obscuration of the
left cardiac border and hemidiaphragm of pleural effusion with
likely superimposed atelectasis/consolidation without significant
interval change. There is some blunting of the right CP angle seen
of likely small pleural effusion
IMPRESSION: There has been no significant interval change in the cardiomegaly
and obscuration of the left cardiac border and hemidiaphragm of
pleural effusion with likely superimposed atelectasis-consolidation.

## 2022-08-27 ENCOUNTER — Ambulatory Visit: Payer: Medicare Other | Attending: Interventional Cardiology

## 2022-08-27 DIAGNOSIS — Z7901 Long term (current) use of anticoagulants: Secondary | ICD-10-CM

## 2022-08-27 DIAGNOSIS — I4821 Permanent atrial fibrillation: Secondary | ICD-10-CM

## 2022-08-27 DIAGNOSIS — E669 Obesity, unspecified: Secondary | ICD-10-CM

## 2022-08-27 DIAGNOSIS — G4733 Obstructive sleep apnea (adult) (pediatric): Secondary | ICD-10-CM

## 2022-08-27 DIAGNOSIS — D6869 Other thrombophilia: Secondary | ICD-10-CM

## 2022-08-28 LAB — LIPID PANEL
Chol/HDL Ratio: 2.5 ratio (ref 0.0–4.4)
Cholesterol, Total: 144 mg/dL (ref 100–199)
HDL: 57 mg/dL (ref >39)
LDL Chol Calc (NIH): 68 mg/dL (ref 0–99)
Triglycerides: 101 mg/dL (ref 0–149)
VLDL Cholesterol Cal: 19 mg/dL (ref 5–40)

## 2022-09-04 ENCOUNTER — Encounter: Payer: Self-pay | Admitting: Pulmonary Disease

## 2022-09-04 ENCOUNTER — Ambulatory Visit (INDEPENDENT_AMBULATORY_CARE_PROVIDER_SITE_OTHER): Payer: Medicare Other | Admitting: Pulmonary Disease

## 2022-09-04 VITALS — BP 116/78 | HR 98 | Ht 62.0 in | Wt 166.2 lb

## 2022-09-04 DIAGNOSIS — J90 Pleural effusion, not elsewhere classified: Secondary | ICD-10-CM

## 2022-09-04 DIAGNOSIS — R5381 Other malaise: Secondary | ICD-10-CM

## 2022-09-04 DIAGNOSIS — K658 Other peritonitis: Secondary | ICD-10-CM | POA: Diagnosis not present

## 2022-09-04 NOTE — Patient Instructions (Addendum)
We will refer you to [facility] to help with deconditioning.   Reduce prednisone to 2.5mg  daily  Will check chest x-ray at follow up in 1 month

## 2022-09-04 NOTE — Progress Notes (Signed)
Synopsis: Referred in October 2022 for hospital follow up for pleural effusions  Subjective:   PATIENT ID: Rebecca Short GENDER: female DOB: 09/03/1940, MRN: [id number]  HPI  Chief Complaint  Patient presents with   Follow-up    1 mo f/u. States she has been doing well since last visit. Denies any new concerns.    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   She had covid vaccine on 3/19 and pneumococcal vaccine 3/15. She held her methotrexate for 2 weeks to get the vaccines. She remains on 5mg  of prednisone.  She has not been as active with her fitness routine. She is requesting referral to [facility].  OV 07/31/22 She has done well on the 5mg  of prednisone daily. No increased shortness of breath since last visit.   Chest radiograph done today shows stable left effusion.  OV 07/01/22 She has done well with tapering to 7.5mg  of prednisone since last visit. No increase in shortness of breath. She has been transitioned to IM methotrexate from PO and is tolerating it well.   Chest x-ray today shows slightly increased left effusion compared to last month.   OV 05/29/22 She was treated with Zpak at last visit for possible lower respiratory infection based on chest radiograph with concern for right medial lower lobe infiltrate.   CRP and ESR were not elevated at last visit. Chest radiograph was stable.  She has not been doing her workout regimen as she has not felt like it. She has had issues with hypotension when taking her metoprolol. This has been held. She has felt like this over the past 2 weeks. Denies fevers, chills or sweats. Her appetite is down. She has noticed right shoulder discomfort intermittently.  OV 05/01/22 She was tapered to 10mg  daily at last visit  on 11/20. She reports not feeling well since Monday with increased dyspnea, reduced appetite and changes in her blood pressure. She was not able to eat her cereal on Monday due to lack of appetite. She reports low BP readings with sBP in the 80s. Her metoprolol dose was decreased in half with improvement in her blood pressure but her baseline HR is 90s-100s. Her appetite has since improved. She does report lower extremity edema, right > left. Denies fevers, chills, sweats, nausea or vomiting.   OV 04/14/22 She was tapered to 12.5mg  of prednisone at last visit. She has been doing well since last visit. She continues methotrexate weekly.  OV 03/17/22 She was seen by Dr. Dimple Casey 03/03/22. She has continued on methotrexate 15mg  weekly with folic acid. She continues ton 15mg  of prednisone daily.  She has been feeling fine since last visit. Repeat Chest x-ray shows persistent left pleural effusion.   OV 01/21/22 She started methotrexate therapy 7/31 and is currently on 15mg  weekly with folic acid 1mg  daily. She has continued on 15mg  prednisone daily. Recommended 2 months of methotrexate treatment prior to tapering of her steroids.  She was evalauted by CT surgery, PET scan is negative. She was offered VATs with mechanical pleuredesis or pleurX catheter placement but has opted not to pursue these options at this time. Chest radiograph 8/11 shows mild increase in left pleural effusion.  Her symptoms remain stable at this time on the above regimen.   She expressed frustration  with her health and not feeling completely better after the last year and she expresses concern with the immunosuppression side effects.   OV 12/06/21 She was tapered down to 10mg  of prednisone daily on 6/12. She reported  progressive fatigue and shortness of breath over week on 7/3. Chest x-ray showed an increase in her left pleural effusion as well as return of a small right pleural effusion. She was admitted 7/4 and left pigtail chest  tube was placed on 7/5. She drained a total of of fluid with minimal drainage there after. CT chest scan after chest tube placement showed LLL infiltrate vs atelecatsis and resolution of the effusion on the left. There was no need for pleural lytic therapy and her lung expanded appropriately. The effusion was again exudative. Cytology was negative for malignancy.  Her prednisone was increased back to 20mg  daily an she continued on azathioprine 100mg  daily. She is feeling much better since before the hospital.  Dr. Dimple Casey of rheumatology was notified. We discussed transition to methotrexate from azathioprine.   Her pleural fluid ADA was not elevated and the AFB smear on pleural fluid is negative. Quantiferon gold is negative.   Chest radiograph today shows return of left pleural effusion, but not as significant as her 7/3 chest x-ray. Right effusion has decreased or resolved.   OV 09/3021 She was seen by [name], NP on 09/27/21 for acute visit due to worsening joint pains and concern for worsening of her pleural effusion. Chest radiograph was overall stable compared to March. It was recommended she continue on the scheduled prednisone taper.   Since that visit she has not had further issues with joint pains or return of dyspnea. She overall feels well. She is currently taking 15mg  prednisone daily and has stopped atovaquone antibiotic.  She complains of ear fullness and having trouble hearing and is scheduled to see ENT next month.   OV 09/13/21 She remains on slow prednisone taper and is down to 20mg  daily along with atovaquone prophylaxis. She saw Dr. Dimple Casey of rheumatology on 4/17 with no changes to her regimen.   She continues to feel well and continues her weekly exercise and strength regimen. She feels her stamina continues to improve.  OV 08/20/21 She continues to do well since last visit and her stamina is increasing and her balance is getting better. She is getting out to walk in the  local parks. She is currently taking 25mg  prednisone daily and atovaquone daily.   OV 07/23/21 She continues on imuran therapy 100mg  daily which has been increased from 50mg  daily by Dr. Dimple Casey her rheumatologist, note from 07/09/21 reviewed. She has started her prednisone taper and is currently taking 30mg  daily. She remains on atovaquone prophylaxis. 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  She has done well since last visit. No increase in shortness of breath. She continues to progress with her physical activity.  OV 06/25/21 She has been doing well since last visit. She was started on imuran per Dr. Dimple Casey and she has follow up with him on 07/09/21. She remains on 40mg  of prednisone daily. She is working with home PT with good results. She reports her daily activities are increasing as she is now able to vacuum 2 rooms in her home compared to 1 previously.   Chest radiograph today shows chronic left pleural effusion, stable from last month.  OV 05/22/21 She was evaluated by Dr. 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 Anti-smooth muscle anitbody is elevated. Cryoglobulins are negative.   Discussed pleural fluid studies further with Dr. Kenard Gower which showed mixed granulocytes and histioctyes along with some multinucleated giant cells. A definite diagnosis can't be placed on these findings, but he did raise concern for rheumatoid arthritis.   These upates were shared with the patient, her husband and friend.   Chest x-ray today shows persistent left pleural effusion and clear right lung.   Patient is feeling much better after increasing her prednisone back to 40mg  daily. She remains on prophylactic antibiotics. Her shoulder pain has also resolved with the prednisone.   She continues to work with PT each week and feels her conditioning in improving.  OV 04/24/21 She was seen in acute visit  on 11/28 by [name], NP for increasing shortness of breath and pleuritic chest pain. She had tapered down to 5mg  of prednisone at this time. She had repeat thoracentesis on 04/08/21 based on CT chest from 04/22/21 which continued to show persistent left pleural effusion. Post-thoracentesis x-ray showed left pleural effusion and small right pleural effusion. Chest x-ray on 11/28 shows increased left pleural effusion.   Patient complains of increasing shortness of breath, bilateral shoulder pain and increasing left-sided discomfort.  Labs from visit on 11/16 showed rheumatoid factor I30 previously 57.2 on 01/21/2021.  P ANCA titer was 1:80 on 04/10/2021.  The following labs were negative on 04/10/2021: ANA, anti-CCP, dsDNA antibody and previously negative for SSA and SSB and antihistone antibodies.  OV 03/06/21 She was admitted 8/27 to 9/7 for pericardial effusion and bilateral pleural effusions. She is status post pericardial drain and multiple thoracenteses. The pleural fluid studies were consistent with exudative process which were initially neutrophil predominant that later became lymphocyte predominant. Cytology was negative for malignancy but showed reactive mesothelial cells.  She is being treated for serositis with prednisone taper and colchicine for the pericardial effusion. She has been on 40mg  of prednisone over the past month. She has been on pneumocystis prophylaxis with atovaquone, she developed skin rash with bactrim. Her inflammatory workup was rather unrevealing as ANA, CCP anti-histone ab and SSA/SSB were negative. Her rheumatoid factor was elevated at 57.2.   She is feeling significantly better since discharge is resuming her normal activity and is quite satisfied with her recovering thus far.   Chest radiograph today shows opacities at the left lung base. The right lung is significantly improved since prior study with resolution of right pleural effusion.  Past Medical History:   Diagnosis Date   A-fib    Allergy    Atrial fibrillation    FH: cholecystectomy 1997   Macular degeneration    OSA (obstructive sleep apnea)    mild with total AHI 7.25/hr and severe during REM sleep at 35/hr now on CPAP at 6cm H2O     Family History  Problem Relation Age of Onset   Osteoporosis Mother    Macular degeneration Mother    Heart Problems Father    Stroke Father    Diabetes Brother    Kidney failure Brother    Cancer Maternal Grandmother    Dementia Paternal Grandmother    Suicidality Other    Heart attack Neg Hx      Social History   Socioeconomic History   Marital status: Married    Spouse name: Not on file   Number of children: Not on file   Years of education: Not on file   Highest education level: Not on  file  Occupational History   Not on file  Tobacco Use   Smoking status: Never    Passive exposure: Past   Smokeless tobacco: Never  Vaping Use   Vaping Use: Never used  Substance and Sexual Activity   Alcohol use: Never   Drug use: Never   Sexual activity: Not on file  Other Topics Concern   Not on file  Social History Narrative   ** Merged History Encounter **       Social Determinants of Health   Financial Resource Strain: Not on file  Food Insecurity: No Food Insecurity (07/04/2022)   Hunger Vital Sign    Worried About Running Out of Food in the Last Year: Never true    Ran Out of Food in the Last Year: Never true  Transportation Needs: No Transportation Needs (07/04/2022)   PRAPARE - Administrator, Civil ServiceTransportation    Lack of Transportation (Medical): No    Lack of Transportation (Non-Medical): No  Physical Activity: Not on file  Stress: Not on file  Social Connections: Not on file  Intimate Partner Violence: Not on file     Allergies  Allergen Reactions   Erythromycin Other (See Comments)    Other reaction(s): burning   Azathioprine     Other Reaction(s): WEAKNESS,FATIGUE HAIR LOSS   Sulfa Antibiotics     Other reaction(s): Unknown    Azithromycin Other (See Comments)    Pain in arm with IV infusion    Condrolite [Glucosamine-Chondroitin-Msm] Rash   Glucosamine Rash    Other reaction(s): rash Other reaction(s): rash   Glucosamine Sulfate-Msm Rash   Sulfamethoxazole-Trimethoprim Rash    Other reaction(s): rash Other reaction(s): rash     Outpatient Medications Prior to Visit  Medication Sig Dispense Refill   atenolol (TENORMIN) 25 MG tablet Take half tablet by mouth daily as needed for heart rate >110 45 tablet 3   CALCIUM CITRATE-VITAMIN D PO Take 1 tablet by mouth at bedtime.     carboxymethylcellulose (REFRESH PLUS) 0.5 % SOLN Place 1 drop into both eyes 2 (two) times daily.     ELIQUIS 5 MG TABS tablet TAKE 1 TABLET BY MOUTH TWICE A DAY 60 tablet 6   EPIPEN 2-PAK 0.3 MG/0.3ML SOAJ injection Inject 0.3 mg into the muscle once as needed for anaphylaxis (severe allergic reaction).  0   famotidine (PEPCID) 10 MG tablet Take 1-2 tablets by mouth twice daily as needed 60 tablet 6   folic acid (FOLVITE) 1 MG tablet Take 1 tablet (1 mg total) by mouth daily. 90 tablet 3   furosemide (LASIX) 20 MG tablet Take 1 tablet (20 mg total) by mouth daily. 90 tablet 3   methotrexate 50 MG/2ML injection INJECT 0.5 MLS INTO THE SKIN ONCE A WEEK. 4 mL 1   Multiple Vitamin (MULTIVITAMIN WITH MINERALS) TABS tablet Take 1 tablet by mouth every morning.     Multiple Vitamins-Minerals (PRESERVISION AREDS PO) Take 1 capsule by mouth 2 (two) times daily.     potassium chloride (KLOR-CON) 20 MEQ packet USE 1 PACKET ONCE DAILY 90 packet 2   predniSONE (DELTASONE) 5 MG tablet TAKE 1 TABLET BY MOUTH EVERY DAY WITH BREAKFAST 30 tablet 1   TUBERCULIN SYR 1CC/26GX3/8" 26G X 3/8" 1 ML MISC Use for methotrexate injection once weekly 25 each 0   No facility-administered medications prior to visit.   Review of Systems  Constitutional:  Negative for chills, fever, malaise/fatigue and weight loss.  HENT:  Negative for congestion, sinus pain and sore  throat.   Eyes: Negative.   Respiratory:  Positive for shortness of breath (with exertion). Negative for cough, hemoptysis, sputum production and wheezing.   Cardiovascular:  Negative for chest pain, palpitations, orthopnea, claudication and leg swelling.  Gastrointestinal:  Negative for abdominal pain, heartburn, nausea and vomiting.  Genitourinary: Negative.   Musculoskeletal:  Negative for joint pain and myalgias.  Skin:  Negative for rash.  Neurological:  Negative for weakness.  Endo/Heme/Allergies:  Bruises/bleeds easily.  Psychiatric/Behavioral: Negative.     Objective:   Vitals:   09/04/22 0959  BP: 116/78  Pulse: 98  SpO2: 99%  Weight: 166 lb 3.2 oz (75.4 kg)  Height:  (1.575 m)    Physical Exam Constitutional:      General: She is not in acute distress.    Appearance: She is not ill-appearing.  HENT:     Head: Normocephalic and atraumatic.  Cardiovascular:     Rate and Rhythm: Normal rate and regular rhythm.     Pulses: Normal pulses.     Heart sounds: Normal heart sounds. No murmur heard. Pulmonary:     Effort: Pulmonary effort is normal.     Breath sounds: Examination of the left-middle field reveals decreased breath sounds. Examination of the left-lower field reveals decreased breath sounds. Decreased breath sounds present. No wheezing, rhonchi or rales.  Musculoskeletal:     Right lower leg: No edema.     Left lower leg: No edema.  Skin:    General: Skin is warm and dry.     Findings: Bruising (on hands b/l) present.  Neurological:     General: No focal deficit present.     Mental Status: She is alert.    CBC    Component Value Date/Time   WBC 7.2 08/04/2022 1110   RBC 4.87 08/04/2022 1110   HGB 14.4 08/04/2022 1110   HGB 15.7 12/27/2018 0831   HCT 43.4 08/04/2022 1110   HCT 47.4 (H) 12/27/2018 0831   PLT 259 08/04/2022 1110   PLT 213 12/27/2018 0831   MCV 89.1 08/04/2022 1110   MCV 90 12/27/2018 0831   MCH 29.6 08/04/2022 1110   MCHC  33.2 08/04/2022 1110   RDW 15.4 (H) 08/04/2022 1110   RDW 13.1 12/27/2018 0831   LYMPHSABS 454 (L) 08/04/2022 1110   MONOABS 0.7 05/01/2022 0933   EOSABS 43 08/04/2022 1110   BASOSABS 43 08/04/2022 1110      Latest Ref Rng & Units 08/04/2022   11:10 AM 05/01/2022    9:33 AM 03/03/2022    2:04 PM  BMP  Glucose 65 - 99 mg/dL 86  409  811   BUN 7 - 25 mg/dL Creatinine 0.60 - 0.95 mg/dL 9.14  7.82  9.56   BUN/Creat Ratio 6 - 22 (calc) SEE NOTE:   SEE NOTE:   Sodium 135 - 146 mmol/L 137  133  138   Potassium 3.5 - 5.3 mmol/L 4.3  4.2  4.7   Chloride 98 - 110 mmol/L 100  95  100   CO2 20 - 32 mmol/L 31  33  31   Calcium 8.6 - 10.4 mg/dL 9.5  9.1  9.4    Chest imaging: CXR 07/31/22 Unchanged appearance of the chest x-ray, with persisting left-sided pleural effusion and associated atelectasis/consolidation  CXR 07/01/22 Moderate to large left pleural effusion, increased in size when compared with prior exam.  CXR 03/17/22 Unchanged left-sided pleural effusion with associated atelectasis/consolidation  CXR  12/06/21 Small left pleural effusion, decreased in size when compared with prior exam. Similar trace right pleural effusion.  CXR 11/25/21 1. Increased LEFT pleural effusion, now moderate to large in size. 2. Probable small RIGHT pleural effusion.  CXR 10/22/21 Stable left effusion. No blunting of the right costophrenic angle.  CXR 09/27/21 Mild-to-moderate cardiomegaly. Again seen is the obscuration of the left cardiac border and hemidiaphragm of pleural effusion with likely superimposed atelectasis/consolidation without significant interval change. There is some blunting of the right CP angle seen of likely small pleural effusion  CXR 08/20/21 Similar appearance of the chest x-ray with left-sided pleural effusion and associated atelectasis/consolidation  CXR 06/25/21 1. Stable small left pleural effusion. 2. No new focal lung infiltrate.  CXR 05/22/21 There is  a small to medium size left pleural effusion, not significantly changed since 04/24/2021. Aeration of the left lung is also not significantly changed. The previously seen small right effusion has resolved. The right lung is now clear. There is no pneumothorax.  CXR 04/22/21 Increasing LEFT effusion and associated airspace disease. Correlate with any signs of infection. Given persistence/worsening underlying pulmonary or bronchial lesion would be difficult to exclude. Correlate with results of prior thoracentesis and consider follow-up chest CT for further evaluation as warranted.   Trace RIGHT effusion with basilar atelectasis.  CXR 03/06/21 Resolved right-sided pleural effusion with persistent opacity on the left, likely a combination of persisting pleural fluid and associated atelectasis/consolidation  CXR 01/30/21 1. Slight interval decrease in size of the right pleural effusion with improved aeration of the right base. 2. No significant interval change of the larger left pleural effusion and adjacent airspace disease.  CT Chest 01/03/21 Moderate left and small right pleural effusions, which is increased on the left and new on the right in comparison to prior CT. Adjacent bibasilar and lingular atelectasis.   Increased, moderate-sized pericardial effusion. Biatrial enlargement.   Mild pulmonary edema.  PFT:     No data to display          Labs: 01/21/21: Quantiferon gold indeterminate  Path:  Echo 01/20/21:  1. Left ventricular ejection fraction, by estimation, is 60 to 65%. The  left ventricle has normal function. The left ventricle has no regional  wall motion abnormalities.   2. A small pericardial effusion is present. There is no evidence of  cardiac tamponade.  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  She is to reduce prednisone to 2.5mg  daily. She continues on methotrexate IM for her RA. We will check a chest x-ray at next visit.  Referral to PT placed for deconditioning.   Follow up in 1 month  Melody Comas, MD [facility] Office: [phone]   Current Outpatient Medications:    atenolol (TENORMIN) 25 MG tablet, Take half tablet by mouth daily as needed for heart rate >110, Disp: 45 tablet, Rfl: 3   CALCIUM CITRATE-VITAMIN D PO, Take 1 tablet by mouth at bedtime., Disp: , Rfl:    carboxymethylcellulose (REFRESH PLUS) 0.5 % SOLN, Place 1 drop into both eyes 2 (two) times daily., Disp: , Rfl:    ELIQUIS 5 MG TABS  tablet, TAKE 1 TABLET BY MOUTH TWICE A DAY, Disp: 60 tablet, Rfl: 6   EPIPEN 2-PAK 0.3 MG/0.3ML SOAJ injection, Inject 0.3 mg into the muscle once as needed for anaphylaxis (severe allergic reaction)., Disp: , Rfl: 0   famotidine (PEPCID) 10 MG tablet, Take 1-2 tablets by mouth twice daily as needed, Disp: 60 tablet, Rfl: 6   folic acid (FOLVITE) 1 MG tablet, Take 1 tablet (1 mg total) by mouth daily., Disp: 90 tablet, Rfl: 3   furosemide (LASIX) 20 MG tablet, Take 1 tablet (20 mg total) by mouth daily., Disp: 90 tablet, Rfl: 3   methotrexate 50 MG/2ML injection, INJECT 0.5 MLS INTO THE SKIN ONCE A WEEK., Disp: 4 mL, Rfl: 1   Multiple Vitamin (MULTIVITAMIN WITH MINERALS) TABS tablet, Take 1 tablet by mouth every morning., Disp: , Rfl:    Multiple Vitamins-Minerals (PRESERVISION AREDS PO), Take 1 capsule by mouth 2 (two) times daily., Disp: , Rfl:    potassium chloride (KLOR-CON) 20 MEQ packet, USE 1 PACKET ONCE DAILY, Disp: 90 packet, Rfl: 2   predniSONE (DELTASONE) 5 MG tablet, TAKE 1 TABLET BY MOUTH  EVERY DAY WITH BREAKFAST, Disp: 30 tablet, Rfl: 1   TUBERCULIN SYR 1CC/26GX3/8" 26G X 3/8" 1 ML MISC, Use for methotrexate injection once weekly, Disp: 25 each, Rfl: 0

## 2022-09-05 ENCOUNTER — Encounter: Payer: Self-pay | Admitting: Pulmonary Disease

## 2022-09-18 ENCOUNTER — Telehealth: Payer: Self-pay | Admitting: Pulmonary Disease

## 2022-09-18 NOTE — Telephone Encounter (Signed)
Difficult to say exactly what this could explain, but most likely it is related to the pleural effusion increasing in size. How does she feel doing her exertional activities and walking around?  Thanks, JD

## 2022-09-18 NOTE — Telephone Encounter (Signed)
Pt called the office stating that she has been using the spirometer and states that she is not able to get the spirometer up to as high as she used to be able to do. States now she can get it up to 1500 which is a struggle to do when she states she used to be able to get it up to around 1750 and before that she was able to get it up to 2000 and now it is a struggle to even get it up to 1500.  Pt wants to know if she should be concerned about this. She is requesting a call from Dr. Francine Graven to further discuss this.  Dr. Francine Graven, please advise.

## 2022-09-19 NOTE — Telephone Encounter (Signed)
Spoke with the pt  She states that over the past wk she has had more SOB  She gets winded walking room to room at home at times  Dr Francine Graven had opening for 09/22/22 so I added her on for acute visit and advised to arrive 15 min prior to appt time  I advised her to seek emergent care if her symptoms worsen She verbalized understanding

## 2022-09-22 ENCOUNTER — Ambulatory Visit (INDEPENDENT_AMBULATORY_CARE_PROVIDER_SITE_OTHER): Payer: Medicare Other | Admitting: Pulmonary Disease

## 2022-09-22 ENCOUNTER — Ambulatory Visit (INDEPENDENT_AMBULATORY_CARE_PROVIDER_SITE_OTHER): Payer: Medicare Other

## 2022-09-22 ENCOUNTER — Other Ambulatory Visit: Payer: Self-pay

## 2022-09-22 ENCOUNTER — Encounter: Payer: Self-pay | Admitting: Pulmonary Disease

## 2022-09-22 VITALS — BP 126/72 | HR 95 | Ht 62.0 in | Wt 166.0 lb

## 2022-09-22 DIAGNOSIS — K658 Other peritonitis: Secondary | ICD-10-CM

## 2022-09-22 DIAGNOSIS — R0602 Shortness of breath: Secondary | ICD-10-CM

## 2022-09-22 DIAGNOSIS — J90 Pleural effusion, not elsewhere classified: Secondary | ICD-10-CM

## 2022-09-22 LAB — C-REACTIVE PROTEIN: CRP: 3.1 mg/dL (ref 0.5–20.0)

## 2022-09-22 LAB — SEDIMENTATION RATE: Sed Rate: 25 mm/hr (ref 0–30)

## 2022-09-22 LAB — BRAIN NATRIURETIC PEPTIDE: Pro B Natriuretic peptide (BNP): 265 pg/mL — ABNORMAL HIGH (ref 0.0–100.0)

## 2022-09-22 MED ORDER — PREDNISONE 10 MG PO TABS: 10.0000 mg | ORAL_TABLET | Freq: Every day | ORAL | 0 refills | Status: DC

## 2022-09-22 NOTE — Progress Notes (Unsigned)
Synopsis: Referred in October 2022 for hospital follow up for pleural effusions  Subjective:   PATIENT ID: Rebecca Short GENDER: female DOB: 04/07/1941, MRN: [id number]  HPI  Chief Complaint  Patient presents with   Acute Visit    Increased SOB that started as early as Monday last week. Currently on 2.5mg  of prednisone. Started this dosage on 4/12.    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   She reports increasing dyspnea over the past week. Her prednisone was decreased to 2.5mg  daily at last visit. She reports a decrease in her tidal volume based on incentive spirometry measurements.   Chest x-ray today shows stable left effusion, but new small right pleural effusion.   Denies cough, fever, chills or sweats.  OV 09/04/22 She had covid vaccine on 3/19 and pneumococcal vaccine 3/15. She held her methotrexate for 2 weeks to get the vaccines. She remains on 5mg  of prednisone.  She has not been as active with her fitness routine. She is requesting referral to [facility].  OV 07/31/22 She has done well on the 5mg  of prednisone daily. No increased shortness of breath since last visit.   Chest radiograph done today shows stable left effusion.  OV 07/01/22 She has done well with tapering to 7.5mg  of prednisone since last visit. No increase in shortness of breath. She has been transitioned to IM methotrexate from PO and is tolerating it well.   Chest x-ray today shows slightly increased left effusion compared to last month.   OV 05/29/22 She was treated with Zpak at last visit for possible lower respiratory infection based on chest radiograph with concern for right medial lower lobe infiltrate.   CRP and ESR were not elevated at last visit. Chest radiograph was  stable.  She has not been doing her workout regimen as she has not felt like it. She has had issues with hypotension when taking her metoprolol. This has been held. She has felt like this over the past 2 weeks. Denies fevers, chills or sweats. Her appetite is down. She has noticed right shoulder discomfort intermittently.  OV 05/01/22 She was tapered to 10mg  daily at last visit on 11/20. She reports not feeling well since Monday with increased dyspnea, reduced appetite and changes in her blood pressure. She was not able to eat her cereal on Monday due to lack of appetite. She reports low BP readings with sBP in the 80s. Her metoprolol dose was decreased in half with improvement in her blood pressure but her baseline HR is 90s-100s. Her appetite has since improved. She does report lower extremity edema, right > left. Denies fevers, chills, sweats, nausea or vomiting.   OV 04/14/22 She was tapered to 12.5mg  of prednisone at last visit. She has been doing well since last visit. She continues methotrexate weekly.  OV 03/17/22 She was seen by Dr. Dimple Casey 03/03/22. She has continued on methotrexate 15mg  weekly with folic acid. She continues ton 15mg  of prednisone daily.  She has been feeling fine since last visit. Repeat Chest x-ray shows persistent left pleural effusion.   OV 01/21/22 She started methotrexate therapy 7/31 and is currently on 15mg  weekly with folic acid 1mg  daily. She has continued on 15mg  prednisone daily. Recommended 2 months of methotrexate treatment prior to tapering of  her steroids.  She was evalauted by CT surgery, PET scan is negative. She was offered VATs with mechanical pleuredesis or pleurX catheter placement but has opted not to pursue these options at this time. Chest radiograph 8/11 shows mild increase in left pleural effusion.  Her symptoms remain stable at this time on the above regimen.   She expressed frustration with her health and not feeling completely better after the  last year and she expresses concern with the immunosuppression side effects.   OV 12/06/21 She was tapered down to 10mg  of prednisone daily on 6/12. She reported  progressive fatigue and shortness of breath over week on 7/3. Chest x-ray showed an increase in her left pleural effusion as well as return of a small right pleural effusion. She was admitted 7/4 and left pigtail chest tube was placed on 7/5. She drained a total of of fluid with minimal drainage there after. CT chest scan after chest tube placement showed LLL infiltrate vs atelecatsis and resolution of the effusion on the left. There was no need for pleural lytic therapy and her lung expanded appropriately. The effusion was again exudative. Cytology was negative for malignancy.  Her prednisone was increased back to 20mg  daily an she continued on azathioprine 100mg  daily. She is feeling much better since before the hospital.  Dr. Dimple Casey of rheumatology was notified. We discussed transition to methotrexate from azathioprine.   Her pleural fluid ADA was not elevated and the AFB smear on pleural fluid is negative. Quantiferon gold is negative.   Chest radiograph today shows return of left pleural effusion, but not as significant as her 7/3 chest x-ray. Right effusion has decreased or resolved.   OV 09/3021 She was seen by [name], NP on 09/27/21 for acute visit due to worsening joint pains and concern for worsening of her pleural effusion. Chest radiograph was overall stable compared to March. It was recommended she continue on the scheduled prednisone taper.   Since that visit she has not had further issues with joint pains or return of dyspnea. She overall feels well. She is currently taking 15mg  prednisone daily and has stopped atovaquone antibiotic.  She complains of ear fullness and having trouble hearing and is scheduled to see ENT next month.   OV 09/13/21 She remains on slow prednisone taper and is down to 20mg  daily along with  atovaquone prophylaxis. She saw Dr. Dimple Casey of rheumatology on 4/17 with no changes to her regimen.   She continues to feel well and continues her weekly exercise and strength regimen. She feels her stamina continues to improve.  OV 08/20/21 She continues to do well since last visit and her stamina is increasing and her balance is getting better. She is getting out to walk in the local parks. She is currently taking 25mg  prednisone daily and atovaquone daily.   OV 07/23/21 She continues on imuran therapy 100mg  daily which has been increased from 50mg  daily by Dr. Dimple Casey her rheumatologist, note from 07/09/21 reviewed. She has started her prednisone taper and is currently taking 30mg  daily. She remains on atovaquone prophylaxis. 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  She has done well since last visit. No increase in shortness of breath. She continues to progress with her physical activity.  OV 06/25/21 She has been doing well since last visit. She was started on imuran per Dr. Dimple Casey and she has follow up with him on 07/09/21. She remains on 40mg  of prednisone daily.  She is working with home PT with good results. She reports her daily activities are increasing as she is now able to vacuum 2 rooms in her home compared to 1 previously.   Chest radiograph today shows chronic left pleural effusion, stable from last month.  OV 05/22/21 She was evaluated by Dr. 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 Anti-smooth muscle anitbody is elevated. Cryoglobulins are negative.   Discussed pleural fluid studies further with Dr. Kenard Gower which showed mixed granulocytes and histioctyes along with some multinucleated giant cells. A definite diagnosis can't be placed on these findings, but he did raise concern for rheumatoid arthritis.   These upates were shared with the patient, her husband and friend.   Chest  x-ray today shows persistent left pleural effusion and clear right lung.   Patient is feeling much better after increasing her prednisone back to 40mg  daily. She remains on prophylactic antibiotics. Her shoulder pain has also resolved with the prednisone.   She continues to work with PT each week and feels her conditioning in improving.  OV 04/24/21 She was seen in acute visit on 11/28 by [name], NP for increasing shortness of breath and pleuritic chest pain. She had tapered down to 5mg  of prednisone at this time. She had repeat thoracentesis on 04/08/21 based on CT chest from 04/22/21 which continued to show persistent left pleural effusion. Post-thoracentesis x-ray showed left pleural effusion and small right pleural effusion. Chest x-ray on 11/28 shows increased left pleural effusion.   Patient complains of increasing shortness of breath, bilateral shoulder pain and increasing left-sided discomfort.  Labs from visit on 11/16 showed rheumatoid factor I30 previously 57.2 on 01/21/2021.  P ANCA titer was 1:80 on 04/10/2021.  The following labs were negative on 04/10/2021: ANA, anti-CCP, dsDNA antibody and previously negative for SSA and SSB and antihistone antibodies.  OV 03/06/21 She was admitted 8/27 to 9/7 for pericardial effusion and bilateral pleural effusions. She is status post pericardial drain and multiple thoracenteses. The pleural fluid studies were consistent with exudative process which were initially neutrophil predominant that later became lymphocyte predominant. Cytology was negative for malignancy but showed reactive mesothelial cells.  She is being treated for serositis with prednisone taper and colchicine for the pericardial effusion. She has been on 40mg  of prednisone over the past month. She has been on pneumocystis prophylaxis with atovaquone, she developed skin rash with bactrim. Her inflammatory workup was rather unrevealing as ANA, CCP anti-histone ab and SSA/SSB were  negative. Her rheumatoid factor was elevated at 57.2.   She is feeling significantly better since discharge is resuming her normal activity and is quite satisfied with her recovering thus far.   Chest radiograph today shows opacities at the left lung base. The right lung is significantly improved since prior study with resolution of right pleural effusion.  Past Medical History:  Diagnosis Date   A-fib [facility])    Allergy    Atrial fibrillation (HCC)    FH: cholecystectomy 1997   Macular degeneration    OSA (obstructive sleep apnea)    mild with total AHI 7.25/hr and severe during REM sleep at 35/hr now on CPAP at 6cm H2O     Family History  Problem Relation Age of Onset   Osteoporosis Mother    Macular degeneration Mother    Heart Problems Father    Stroke Father    Diabetes Brother    Kidney failure Brother    Cancer Maternal Grandmother  Dementia Paternal Grandmother    Suicidality Other    Heart attack Neg Hx      Social History   Socioeconomic History   Marital status: Married    Spouse name: Not on file   Number of children: Not on file   Years of education: Not on file   Highest education level: Not on file  Occupational History   Not on file  Tobacco Use   Smoking status: Never    Passive exposure: Past   Smokeless tobacco: Never  Vaping Use   Vaping Use: Never used  Substance and Sexual Activity   Alcohol use: Never   Drug use: Never   Sexual activity: Not on file  Other Topics Concern   Not on file  Social History Narrative   ** Merged History Encounter **       Social Determinants of Health   Financial Resource Strain: Not on file  Food Insecurity: No Food Insecurity (07/04/2022)   Hunger Vital Sign    Worried About Running Out of Food in the Last Year: Never true    Ran Out of Food in the Last Year: Never true  Transportation Needs: No Transportation Needs (07/04/2022)   PRAPARE - Administrator, Civil Service (Medical): No    Lack  of Transportation (Non-Medical): No  Physical Activity: Not on file  Stress: Not on file  Social Connections: Not on file  Intimate Partner Violence: Not on file     Allergies  Allergen Reactions   Erythromycin Other (See Comments)    Other reaction(s): burning   Azathioprine     Other Reaction(s): WEAKNESS,FATIGUE HAIR LOSS   Sulfa Antibiotics     Other reaction(s): Unknown   Azithromycin Other (See Comments)    Pain in arm with IV infusion    Condrolite [Glucosamine-Chondroitin-Msm] Rash   Glucosamine Rash    Other reaction(s): rash Other reaction(s): rash   Glucosamine Sulfate-Msm Rash   Sulfamethoxazole-Trimethoprim Rash    Other reaction(s): rash Other reaction(s): rash     Outpatient Medications Prior to Visit  Medication Sig Dispense Refill   atenolol (TENORMIN) 25 MG tablet Take half tablet by mouth daily as needed for heart rate >110 45 tablet 3   CALCIUM CITRATE-VITAMIN D PO Take 1 tablet by mouth at bedtime.     carboxymethylcellulose (REFRESH PLUS) 0.5 % SOLN Place 1 drop into both eyes 2 (two) times daily.     ELIQUIS 5 MG TABS tablet TAKE 1 TABLET BY MOUTH TWICE A DAY 60 tablet 6   EPIPEN 2-PAK 0.3 MG/0.3ML SOAJ injection Inject 0.3 mg into the muscle once as needed for anaphylaxis (severe allergic reaction).  0   famotidine (PEPCID) 10 MG tablet Take 1-2 tablets by mouth twice daily as needed 60 tablet 6   folic acid (FOLVITE) 1 MG tablet Take 1 tablet (1 mg total) by mouth daily. 90 tablet 3   furosemide (LASIX) 20 MG tablet Take 1 tablet (20 mg total) by mouth daily. 90 tablet 3   methotrexate 50 MG/2ML injection INJECT 0.5 MLS INTO THE SKIN ONCE A WEEK. 4 mL 1   Multiple Vitamin (MULTIVITAMIN WITH MINERALS) TABS tablet Take 1 tablet by mouth every morning.     Multiple Vitamins-Minerals (PRESERVISION AREDS PO) Take 1 capsule by mouth 2 (two) times daily.     potassium chloride (KLOR-CON) 20 MEQ packet USE 1 PACKET ONCE DAILY 90 packet 2   predniSONE  (DELTASONE) 5 MG tablet TAKE 1 TABLET BY  MOUTH EVERY DAY WITH BREAKFAST 30 tablet 1   TUBERCULIN SYR 1CC/26GX3/8" 26G X 3/8" 1 ML MISC Use for methotrexate injection once weekly 25 each 0   No facility-administered medications prior to visit.   Review of Systems  Constitutional:  Negative for chills, fever, malaise/fatigue and weight loss.  HENT:  Negative for congestion, sinus pain and sore throat.   Eyes: Negative.   Respiratory:  Positive for shortness of breath (with exertion). Negative for cough, hemoptysis, sputum production and wheezing.   Cardiovascular:  Negative for chest pain, palpitations, orthopnea, claudication and leg swelling.  Gastrointestinal:  Negative for abdominal pain, heartburn, nausea and vomiting.  Genitourinary: Negative.   Musculoskeletal:  Negative for joint pain and myalgias.  Skin:  Negative for rash.  Neurological:  Negative for weakness.  Endo/Heme/Allergies:  Bruises/bleeds easily.  Psychiatric/Behavioral: Negative.     Objective:   Vitals:   09/22/22 1045  BP: 126/72  Pulse: 95  SpO2: 99%  Weight: 166 lb (75.3 kg)  Height: 5\' 2"  (1.575 m)    Physical Exam Constitutional:      General: She is not in acute distress.    Appearance: She is not ill-appearing.  HENT:     Head: Normocephalic and atraumatic.  Cardiovascular:     Rate and Rhythm: Normal rate and regular rhythm.     Pulses: Normal pulses.     Heart sounds: Normal heart sounds. No murmur heard. Pulmonary:     Effort: Pulmonary effort is normal.     Breath sounds: Examination of the left-middle field reveals decreased breath sounds. Examination of the right-lower field reveals decreased breath sounds. Examination of the left-lower field reveals decreased breath sounds. Decreased breath sounds present. No wheezing, rhonchi or rales.  Musculoskeletal:     Right lower leg: No edema.     Left lower leg: No edema.  Skin:    General: Skin is warm and dry.  Neurological:     General: No  focal deficit present.     Mental Status: She is alert.    CBC    Component Value Date/Time   WBC 7.2 08/04/2022 1110   RBC 4.87 08/04/2022 1110   HGB 14.4 08/04/2022 1110   HGB 15.7 12/27/2018 0831   HCT 43.4 08/04/2022 1110   HCT 47.4 (H) 12/27/2018 0831   PLT 259 08/04/2022 1110   PLT 213 12/27/2018 0831   MCV 89.1 08/04/2022 1110   MCV 90 12/27/2018 0831   MCH 29.6 08/04/2022 1110   MCHC 33.2 08/04/2022 1110   RDW 15.4 (H) 08/04/2022 1110   RDW 13.1 12/27/2018 0831   LYMPHSABS 454 (L) 08/04/2022 1110   MONOABS 0.7 05/01/2022 0933   EOSABS 43 08/04/2022 1110   BASOSABS 43 08/04/2022 1110      Latest Ref Rng & Units 08/04/2022   11:10 AM 05/01/2022    9:33 AM 03/03/2022    2:04 PM  BMP  Glucose 65 - 99 mg/dL 86  409  811   BUN 7 - 25 mg/dL 12  12  18    Creatinine 0.60 - 0.95 mg/dL 9.14  7.82  9.56   BUN/Creat Ratio 6 - 22 (calc) SEE NOTE:   SEE NOTE:   Sodium 135 - 146 mmol/L 137  133  138   Potassium 3.5 - 5.3 mmol/L 4.3  4.2  4.7   Chloride 98 - 110 mmol/L 100  95  100   CO2 20 - 32 mmol/L 31  33  31   Calcium  8.6 - 10.4 mg/dL 9.5  9.1  9.4    Chest imaging: CXR 07/31/22 Unchanged appearance of the chest x-ray, with persisting left-sided pleural effusion and associated atelectasis/consolidation  CXR 07/01/22 Moderate to large left pleural effusion, increased in size when compared with prior exam.  CXR 03/17/22 Unchanged left-sided pleural effusion with associated atelectasis/consolidation  CXR 12/06/21 Small left pleural effusion, decreased in size when compared with prior exam. Similar trace right pleural effusion.  CXR 11/25/21 1. Increased LEFT pleural effusion, now moderate to large in size. 2. Probable small RIGHT pleural effusion.  CXR 10/22/21 Stable left effusion. No blunting of the right costophrenic angle.  CXR 09/27/21 Mild-to-moderate cardiomegaly. Again seen is the obscuration of the left cardiac border and hemidiaphragm of pleural effusion  with likely superimposed atelectasis/consolidation without significant interval change. There is some blunting of the right CP angle seen of likely small pleural effusion  CXR 08/20/21 Similar appearance of the chest x-ray with left-sided pleural effusion and associated atelectasis/consolidation  CXR 06/25/21 1. Stable small left pleural effusion. 2. No new focal lung infiltrate.  CXR 05/22/21 There is a small to medium size left pleural effusion, not significantly changed since 04/24/2021. Aeration of the left lung is also not significantly changed. The previously seen small right effusion has resolved. The right lung is now clear. There is no pneumothorax.  CXR 04/22/21 Increasing LEFT effusion and associated airspace disease. Correlate with any signs of infection. Given persistence/worsening underlying pulmonary or bronchial lesion would be difficult to exclude. Correlate with results of prior thoracentesis and consider follow-up chest CT for further evaluation as warranted.   Trace RIGHT effusion with basilar atelectasis.  CXR 03/06/21 Resolved right-sided pleural effusion with persistent opacity on the left, likely a combination of persisting pleural fluid and associated atelectasis/consolidation  CXR 01/30/21 1. Slight interval decrease in size of the right pleural effusion with improved aeration of the right base. 2. No significant interval change of the larger left pleural effusion and adjacent airspace disease.  CT Chest 01/03/21 Moderate left and small right pleural effusions, which is increased on the left and new on the right in comparison to prior CT. Adjacent bibasilar and lingular atelectasis.   Increased, moderate-sized pericardial effusion. Biatrial enlargement.   Mild pulmonary edema.  PFT:     No data to display          Labs: 01/21/21: Quantiferon gold indeterminate  Path:  Echo 01/20/21:  1. Left ventricular ejection fraction, by estimation,  is 60 to 65%. The  left ventricle has normal function. The left ventricle has no regional  wall motion abnormalities.   2. A small pericardial effusion is present. There is no evidence of  cardiac tamponade.  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  She has stable left pleural effusion and return of right pleural effusion with taper of prednisone to 2.5mg  daily. She has increased leg swelling as well. BNP is elevated. ESR and CRP are within normal range. I have spoken to her cardiology team and they will adjust her lasix dosing.   In the mean time we will increase  her prednisone to 10mg  daily for 1 week, then 7.5mg  1 week and back to 5mg  daily.   Follow up in mid June  Melody Comas, MD [facility] Office: [phone]   Current Outpatient Medications:    atenolol (TENORMIN) 25 MG tablet, Take half tablet by mouth daily as needed for heart rate >110, Disp: 45 tablet, Rfl: 3   CALCIUM CITRATE-VITAMIN D PO, Take 1 tablet by mouth at bedtime., Disp: , Rfl:    carboxymethylcellulose (REFRESH PLUS) 0.5 % SOLN, Place 1 drop into both eyes 2 (two) times daily., Disp: , Rfl:    ELIQUIS 5 MG TABS tablet, TAKE 1 TABLET BY MOUTH TWICE A DAY, Disp: 60 tablet, Rfl: 6   EPIPEN 2-PAK 0.3 MG/0.3ML SOAJ injection, Inject 0.3 mg into the muscle once as needed for anaphylaxis (severe allergic reaction)., Disp: , Rfl: 0   famotidine (PEPCID) 10 MG tablet, Take 1-2 tablets by mouth twice daily as needed, Disp: 60 tablet, Rfl: 6   folic acid (FOLVITE) 1 MG tablet,  Take 1 tablet (1 mg total) by mouth daily., Disp: 90 tablet, Rfl: 3   furosemide (LASIX) 20 MG tablet, Take 1 tablet (20 mg total) by mouth daily., Disp: 90 tablet, Rfl: 3   methotrexate 50 MG/2ML injection, INJECT 0.5 MLS INTO THE SKIN ONCE A WEEK., Disp: 4 mL, Rfl: 1   Multiple Vitamin (MULTIVITAMIN WITH MINERALS) TABS tablet, Take 1 tablet by mouth every morning., Disp: , Rfl:    Multiple Vitamins-Minerals (PRESERVISION AREDS PO), Take 1 capsule by mouth 2 (two) times daily., Disp: , Rfl:    potassium chloride (KLOR-CON) 20 MEQ packet, USE 1 PACKET ONCE DAILY, Disp: 90 packet, Rfl: 2   predniSONE (DELTASONE) 10 MG tablet, Take 1 tablet (10 mg total) by mouth daily with breakfast., Disp: 10 tablet, Rfl: 0   predniSONE (DELTASONE) 5 MG tablet, TAKE 1 TABLET BY MOUTH EVERY DAY WITH BREAKFAST, Disp: 30 tablet, Rfl: 1   TUBERCULIN SYR 1CC/26GX3/8" 26G X 3/8" 1 ML MISC, Use for methotrexate injection once weekly, Disp: 25 each, Rfl: 0

## 2022-09-22 NOTE — Patient Instructions (Addendum)
Start prednisone 10mg  daily today - 4/29 to 5/6  Then decrease to 7.5mg  daily - 5/7 to 5/14  Then back to baseline 5mg  daily on 5/15  We will move your follow up appointment from 5/7 to mid June

## 2022-09-23 ENCOUNTER — Telehealth: Payer: Self-pay

## 2022-09-23 ENCOUNTER — Encounter: Payer: Self-pay | Admitting: Pulmonary Disease

## 2022-09-23 NOTE — Telephone Encounter (Signed)
Patient states she recently saw Dr. Francine Graven for shortness of breath. Patient states her doctor increased her prednisone to 10 mg for one week. Patient states there was a chest x ray and labs done and she has a little bit of plural effusion on the right lung. Patient wanted to give a heads up. Patient call back number is [phone]. Please advise.

## 2022-09-25 ENCOUNTER — Encounter: Payer: Self-pay | Admitting: Pulmonary Disease

## 2022-09-26 ENCOUNTER — Telehealth: Payer: Self-pay | Admitting: Interventional Cardiology

## 2022-09-26 NOTE — Telephone Encounter (Signed)
-----   Message from Corky Crafts, MD sent at 09/23/2022  1:11 PM EDT ----- Regarding: FW: Heart Failure - Lasix dosing Thanks Cletis Athens.  Pat, Please have her increase her furosemide to 40 mg daily for 3 days due to pleural effusions.  If her BP drops too low, can go back to the 20 mg dose. Going forward, we can have her take 20-40 mg daily based on if her weight increases by 3 lbs or if she has other signs of fluid overload like shortness of breath or leg swelling.  JV ----- Message ----- From: Martina Sinner, MD Sent: 09/23/2022  12:00 PM EDT To: Corky Crafts, MD Subject: Heart Failure - Lasix dosing                   Hi Dr. Eldridge Dace,  I saw [facility] yesterday for increasing dyspnea. CXR showed new small right pleural effusion and similar moderate to large left effusion. BNP is elevated while ESR/CRP not elevated. I reduced her prednisone dose 7-10 days before her increase in dyspnea. I wanted to let you know incase you felt her lasix dosing needed to be adjusted.  Thanks, Cletis Athens [phone]

## 2022-09-26 NOTE — Telephone Encounter (Signed)
Spoke with patient and discussed recommendation from Dr. Eldridge Dace:  Please have her increase her furosemide to 40 mg daily for 3 days due to pleural effusions.  If her BP drops too low, can go back to the 20 mg dose. Going forward, we can have her take 20-40 mg daily based on if her weight increases by 3 lbs or if she has other signs of fluid overload like shortness of breath or leg swelling.   JV  Patient expressed much hesitancy increasing Lasix dose due to it causing her BP to drop dangerously low. She states she has been wearing compression stockings/monitoring salt intake/walking more and has lost 5.2 lbs since Monday (09/22/22). She wishes to stay on Lasix 20mg  QD and continue with what she is already doing. She states she feels better now, especially now taking prednisone.   Informed patient I would forward call to Dr. Eldridge Dace to review, and to his nurse Dennie Bible to follow-up with her when Dennie Bible returns next week. Advised patient to call our office if she has any return/worsening of symptoms. Patient verbalized understanding.

## 2022-09-29 ENCOUNTER — Other Ambulatory Visit: Payer: Self-pay | Admitting: *Deleted

## 2022-09-29 ENCOUNTER — Other Ambulatory Visit: Payer: Self-pay | Admitting: Internal Medicine

## 2022-09-29 DIAGNOSIS — M0579 Rheumatoid arthritis with rheumatoid factor of multiple sites without organ or systems involvement: Secondary | ICD-10-CM

## 2022-09-29 MED ORDER — METHOTREXATE SODIUM CHEMO INJECTION 50 MG/2ML: INTRAMUSCULAR | 2 refills | Status: DC

## 2022-09-29 NOTE — Telephone Encounter (Signed)
Last Fill: 08/12/2022  Labs: 08/04/2022 Blood count continues to show moderately low lymphocytes but about the same for several months. Metabolic panel with no problem for continuing methotrexate. CRP is slightly increased to 16 but sedimentation rate remains normal. No specific change needed to methotrexate plan.   Next Visit: 10/27/2022  Last Visit: 08/04/2022  DX: Rheumatoid arthritis involving multiple sites with positive rheumatoid factor   Current Dose per office note 08/04/2022: methotrexate 15 mg subcu weekly   Okay to refill Methotrexate?

## 2022-09-30 ENCOUNTER — Telehealth: Payer: Self-pay

## 2022-09-30 ENCOUNTER — Ambulatory Visit: Payer: Medicare Other | Admitting: Pulmonary Disease

## 2022-09-30 DIAGNOSIS — M0579 Rheumatoid arthritis with rheumatoid factor of multiple sites without organ or systems involvement: Secondary | ICD-10-CM

## 2022-09-30 NOTE — Telephone Encounter (Signed)
Submitted a Prior Authorization request to [employer] for Methotrexate  via CoverMyMeds. Will update once we receive a response.    

## 2022-09-30 NOTE — Telephone Encounter (Signed)
Patient contacted the office stating she just picked up her methotrexate injection and states the directions say to inject 0.6 MLS into the skin. Patient states in the past the directions have said to inject 0.5 MLS into the skin. Patient would like clarification on the dose. Patient call back number is [phone]. Please advise.

## 2022-09-30 NOTE — Telephone Encounter (Signed)
Received notification from [facility] [place] regarding a prior authorization for Methotrexate. Authorization has been APPROVED from 09/16/2022 to until further notice.    Authorization # [id number] Phone # [phone]

## 2022-10-01 MED ORDER — METHOTREXATE SODIUM CHEMO INJECTION 50 MG/2ML: INTRAMUSCULAR | 2 refills | Status: DC

## 2022-10-01 NOTE — Addendum Note (Signed)
Addended by: Fuller Plan on: 10/01/2022 08:38 AM   Modules accepted: Orders

## 2022-10-01 NOTE — Telephone Encounter (Signed)
Patient advised per Dr. Dimple Casey The intended dose is 0.5 mL [place] weekly. Patient verbalized understanding.

## 2022-10-01 NOTE — Telephone Encounter (Signed)
OK.  I would give er the flexibility to take  Lasix 20-40 mg depending on sx.   JV  Call placed to patient.  Left message to call office

## 2022-10-01 NOTE — Telephone Encounter (Signed)
The intended dose is 0.5 mL [place] weekly.

## 2022-10-01 NOTE — Telephone Encounter (Signed)
Patient notified.  She reports she is feeling better.  Using compression stockings and swelling is improving.

## 2022-10-05 ENCOUNTER — Other Ambulatory Visit: Payer: Self-pay | Admitting: Internal Medicine

## 2022-10-06 NOTE — Telephone Encounter (Signed)
Last Fill: 12/15/2021  Next Visit: 10/27/2022  Last Visit: 311/2024  Dx: Rheumatoid arthritis involving multiple sites with positive rheumatoid factor  Current Dose per office note on 08/04/2022: folic acid 1 mg daily   Okay to refill folic acid?

## 2022-10-12 ENCOUNTER — Other Ambulatory Visit: Payer: Self-pay | Admitting: Pulmonary Disease

## 2022-10-12 DIAGNOSIS — J90 Pleural effusion, not elsewhere classified: Secondary | ICD-10-CM

## 2022-10-13 NOTE — Telephone Encounter (Signed)
Dr. Francine Graven, please advise if pt is supposed to be on the 5mg  prednisone as requested or if pt is supposed to be on 10mg  which is also showing on med list.

## 2022-10-15 ENCOUNTER — Telehealth: Payer: Self-pay | Admitting: Pulmonary Disease

## 2022-10-15 NOTE — Telephone Encounter (Signed)
Patient would like refill for Prednisone. Would like 30 day supply. Pharmacy is [facility]. Patient phone number I [phone].

## 2022-10-15 NOTE — Telephone Encounter (Signed)
Ok to refill 5mg  tablet script

## 2022-10-16 NOTE — Telephone Encounter (Signed)
Called pt's pharmacy to see if pt was able to get Rx that was called in and was told that she picked it up 5/22. Nothing further needed.

## 2022-10-27 ENCOUNTER — Ambulatory Visit: Payer: Medicare Other | Admitting: Internal Medicine

## 2022-11-02 NOTE — Progress Notes (Signed)
Office Visit Note  Patient: Rebecca Short             Date of Birth: 05-11-1941           MRN: [id number]             PCP: Daisy Floro, MD Referring: Daisy Floro, MD Visit Date: 11/03/2022   Subjective:  Follow-up (Patient states she was prescribed TB syringes for her methotrexate. With her last refill she was given a different syringe and wants to be sure they are okay to use.)   History of Present Illness: Rebecca Short is a 82 y.o. female here for follow up for seropositive RA on MTX 12.5 mg [place] weekly and folic acid 1 mg daily and prednisone 5 mg daily.  Since our last visit she tapered prednisone down to 5 mg daily which was tolerable but subsequent tapering to 2.5 mg caused worsening of symptoms with shortness of breath, decreased lung volume on at home incentive spirometry, and was found to have right lung effusion at pulmonology clinic evaluation.  She is now back to 5 mg daily.  Also had some adjustment of Lasix dosing for pedal edema with elevated BNP is currently taking 20 mg daily.  Previous HPI 08/04/22 Rebecca Short is a 82 y.o. female here for follow up for seropositive RA association with serositis and persistent left pleural effusion on methotrexate 15 mg subcu weekly and folic acid 1 mg daily and prednisone 5 mg daily.  Since switching from oral to subcu methotrexate she had a great improvement in medication tolerance no longer feeling nauseated and extremely fatigued for 2 to 3 days after each dose.  Right shoulder pain is doing much better after the steroid injection in January as well.  She continued prednisone tapering with close pulmonology follow-up down to 5 mg daily now and has not noticed any significant worsening of symptoms.  She is interested in getting off the remaining steroids also interested in getting updated vaccines to feel safer with more social contacts.   Previous HPI 06/04/22 Rebecca Short is a 82 y.o. female here for follow up for  rheumatoid pleural effusion on MTX 15 mg PO weekly and folic acid 1 mg daily and prednisone 7.5 mg daily. She had some exacerbation with edema and dyspnea but stable pleural effusion on imaging. Labs with slightly worsening of lymphopenia last month as well.  She is noticing what she thinks is a bit more nausea and fatigue type side effects after the methotrexate weekly dose than before taking up to a few days to fully recover rather than less than 1.  Since tapering the steroid dose down to 7.5 mg about a week ago she is experiencing increased in shoulder pain and stiffness on both sides.     Previous HPI 03/03/22 Rebecca Short is a 82 y.o. 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 So far her respiratory symptoms are doing well since last visit. Her mood and energy level are very poor, and frustrated about inability to socialize and limited exertion tolerance. She reports alternating days of more and less energy, sometimes associated with the methotrexate doses on Mondays but not always. Also having some concentration and memory difficulties increased from before for a few months.   Previous HPI 12/30/2021 Rebecca Short is a 82 y.o. female here for follow up  rheumatoid pleural effusion on methotrexate 6 tablets once weekly, folic acid 1 mg daily. She was hospitalized last month with recurrent pleural effusion 7/4-7/7 requiring repeat thoracocentesis. Since discharge she is back onto higher dose prednisone and symptoms are doing okay. We stopped her azathioprine due to apparent treatment failure and she started methotrexate since 7/31 after negative hepatitis screening. She has not seen any problems with GI intolerance. She does feel somewhat unsteady or lightheaded and having to move slowly on account of this.   Previous HPI 09/09/2021 Rebecca Short is a 82 y.o. female here for follow up for rheumatoid pleural  effusion on azathioprine 100 mg daily and prednisone taper and atovaquone ppx. She is doing well since last visit without major events. She has some mild GI irritation with the azathioprine. She has significant bruising on her hands and forearms. She saw Dr. Francine Graven for follow up with stable pulmonary symptoms and repeat chest xray obtained 3/28 was also stable. She is tapering the prednisone of a rate about 5 mg/day dose per month currently down to 20 mg daily. She is curious about safety going to the dentist for procedures on her medications.   Previous HPI 07/09/21 Rebecca Short is a 82 y.o. female here for follow up for rheumatoid pleural effusions after starting azathioprine 50 mg last month, continuing prednisone 40 mg daily and prophylactic atovaquone. She went for repeat chest xray with Dr. Francine Graven that looked stable 2 weeks ago. So far she denies any major side effects after starting imuran.   Previous HPI 05/08/21 Rebecca Short is a 82 y.o. female here for evaluation with recurrent pleural effusions and dyspnea with positive RF and ANCA Abs. She was hospitalized in August after progressively worsening dyspnea and hypotension with exudative pleural fluid removed from left lung space followed by pericardial drainage and initial treatment with colchicine. She subsequently underwent additional thoracentesis drainage of effusions later in August and beginning of September. Results showed neutrophil predominant fluid. Prednisone 40 mg daily was added to treatment with stable condition. This continued for a month but tapering the prednisone resulted in more rapid re accumulation of fluid on a dose of about 20 gm daily or less. Imaging demonstrated progressive increase again in left sided effusions and repeat drainage again on 11/14 and 11/30 when down to 10 mg and 5 mg daily doses of prednisone along with colchicine. She is now continuing prednisone 40 mg with atovaquone prophylaxis and colchicine 0.6 mg  daily. In addition to pulmonary symptoms she reports bilateral shoulder pain and stiffness developing around the initial onset of her dyspnea before hospitalization in August.  This improved completely on 40 mg prednisone but she noticed definite recurrence of shoulder pain symptoms when tapering down the prednisone to 10 or 5 mg/day dose.  No active complaints today since she is back on higher dose medication.  She denies any peripheral joint pain involvement did not notice any significant swelling or numbness in her upper extremities associated with the pain.   Review of Systems  Constitutional:  Positive for fatigue.  HENT:  Negative for mouth sores and mouth dryness.   Eyes:  Positive for dryness.  Respiratory:  Negative for shortness of breath.   Cardiovascular:  Negative for chest pain and palpitations.  Gastrointestinal:  Negative for blood in stool, constipation and diarrhea.  Endocrine: Negative for increased urination.  Genitourinary:  Positive for involuntary urination.  Musculoskeletal:  Positive for gait problem. Negative for joint pain, joint pain, joint swelling, myalgias,  muscle weakness, morning stiffness, muscle tenderness and myalgias.  Skin:  Positive for color change and sensitivity to sunlight. Negative for rash and hair loss.  Allergic/Immunologic: Negative for susceptible to infections.  Neurological:  Negative for dizziness and headaches.  Hematological:  Negative for swollen glands.  Psychiatric/Behavioral:  Negative for depressed mood and sleep disturbance. The patient is not nervous/anxious.     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   Pleural effusion 11/26/2021   Serositis (HCC) 09/27/2021   Fatigue 09/27/2021   Macular pucker, left eye 06/18/2021   High risk medication use 05/30/2021   Bilateral shoulder pain 05/08/2021   Vitamin D deficiency 05/08/2021   Abnormal ANCA  test 05/08/2021   RA (rheumatoid arthritis) (HCC) 04/22/2021   S/P thoracentesis    Arterial hypotension    Pleural effusion on left    Pericardial effusion    Leukocytosis 01/03/2021   Community acquired pneumonia 01/03/2021   Atrial fibrillation (HCC) 12/24/2020   Atrial fibrillation with RVR (HCC) 12/23/2020   Musculoskeletal pain 12/23/2020   Posterior vitreous detachment of both eyes 04/30/2020   Macular pucker, right eye 11/08/2019   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HCC) 11/08/2019   Intermediate stage nonexudative age-related macular degeneration of both eyes 11/08/2019   Anticoagulated 11/18/2016   Bradycardia 03/23/2014   OSA (obstructive sleep apnea) 01/19/2014   Obesity (BMI 30-39.9) 01/19/2014   Paroxysmal A-fib (HCC) 10/04/2013   Nonspecific abnormal unspecified cardiovascular function study 07/21/2013   Acute gastroenteritis 07/15/2013    Past Medical History:  Diagnosis Date   A-fib [facility])    Allergy    Atrial fibrillation (HCC)    FH: cholecystectomy 1997   Macular degeneration    OSA (obstructive sleep apnea)    mild with total AHI 7.25/hr and severe during REM sleep at 35/hr now on CPAP at 6cm H2O    Family History  Problem Relation Age of Onset   Osteoporosis Mother    Macular degeneration Mother    Heart Problems Father    Stroke Father    Diabetes Brother    Kidney failure Brother    Cancer Maternal Grandmother    Dementia Paternal Grandmother    Suicidality Other    Heart attack Neg Hx    Past Surgical History:  Procedure Laterality Date   CHEST TUBE INSERTION Left 11/27/2021   Procedure: CHEST TUBE INSERTION;  Surgeon: Martina Sinner, MD;  Location: [facility] ENDOSCOPY;  Service: Pulmonary;  Laterality: Left;  Prefer AM slot, thanks   CHOLECYSTECTOMY     PERICARDIOCENTESIS N/A 01/07/2021   Procedure: PERICARDIOCENTESIS;  Surgeon: Yvonne Kendall, MD;  Location: [facility] INVASIVE CV LAB;   Service: Cardiovascular;  Laterality: N/A;   REPLACEMENT TOTAL KNEE BILATERAL     THORACENTESIS N/A 01/25/2021   Procedure: THORACENTESIS;  Surgeon: Martina Sinner, MD;  Location: [facility] ENDOSCOPY;  Service: Pulmonary;  Laterality: N/A;   THORACENTESIS N/A 04/08/2021   Procedure: THORACENTESIS;  Surgeon: Martina Sinner, MD;  Location: [facility] ENDOSCOPY;  Service: Pulmonary;  Laterality: N/A;   THORACENTESIS N/A 04/24/2021   Procedure: THORACENTESIS;  Surgeon: Lupita Leash, MD;  Location: [facility] ENDOSCOPY;  Service: Cardiopulmonary;  Laterality: N/A;   Social History   Social History Narrative   ** Merged History Encounter **       Immunization History  Administered Date(s) Administered   COVID-19, mRNA, vaccine(Comirnaty)12 years and older 08/09/2022  Influenza, High Dose Seasonal PF 07/05/2014, 03/03/2015, 06/29/2015, 07/09/2016, 03/03/2017, 07/24/2017, 02/05/2018, 06/23/2018, 06/08/2019, 02/18/2020, 06/08/2020   Influenza, Quadrivalent, Recombinant, Inj, Pf 03/04/2019   Influenza-Unspecified 01/24/2014   PFIZER(Purple Top)SARS-COV-2 Vaccination 06/16/2019, 07/07/2019, 02/18/2020, 06/08/2020   Pneumococcal Polysaccharide-23 03/22/2015, 06/29/2015, 07/09/2016, 07/24/2017, 06/23/2018, 06/08/2019, 06/08/2020, 06/03/2021   Zoster Recombinat (Shingrix) 03/09/2017     Objective: Vital Signs: BP 110/74 (BP Location: Left Arm, Patient Position: Sitting, Cuff Size: Normal)   Pulse (!) 142 Comment: Patient has A-Fib  Resp 12   Ht 5\' 2"  (1.575 m)   Wt 159 lb (72.1 kg)   BMI 29.08 kg/m    Physical Exam Eyes:     Conjunctiva/sclera: Conjunctivae normal.  Cardiovascular:     Rate and Rhythm: Normal rate and regular rhythm.  Pulmonary:     Effort: Pulmonary effort is normal.     Comments: Diminished breath sounds at left lower lung field Musculoskeletal:     Comments: 2+ pitting pedal edema extending less than halfway up from ankles  Lymphadenopathy:     Cervical: No cervical  adenopathy.  Skin:    General: Skin is warm and dry.     Findings: Bruising present. No rash.  Neurological:     Mental Status: She is alert.  Psychiatric:        Mood and Affect: Mood normal.      Musculoskeletal Exam:  Shoulders full ROM no tenderness or swelling Elbows full ROM no tenderness or swelling Wrists full ROM no tenderness or swelling Fingers full ROM no tenderness or swelling Knees full ROM no tenderness or swelling   CDAI Exam: CDAI Score: 0  Patient Global: 0 / 100; Provider Global: 0 / 100 Swollen: 0 ; Tender: 0  Joint Exam 11/03/2022   All documented joints were normal     Investigation: No additional findings.  Imaging: DG Chest 2 View  Result Date: 11/09/2022 CLINICAL DATA:  Pleural effusion EXAM: CHEST - 2 VIEW COMPARISON:  Chest x-ray 09/22/2022 FINDINGS: Moderate-sized left pleural effusion is unchanged. The lungs are otherwise clear. No pneumothorax. The cardiomediastinal silhouette is within normal limits. IMPRESSION: Unchanged moderate-sized left pleural effusion. Electronically Signed   By: Darliss Cheney M.D.   On: 11/09/2022 22:57    Recent Labs: Lab Results  Component Value Date   WBC 6.0 11/03/2022   HGB 15.2 11/03/2022   PLT 256 11/03/2022   NA 135 11/03/2022   K 4.5 11/03/2022   CL 98 11/03/2022   CO2 30 11/03/2022   GLUCOSE 89 11/03/2022   BUN 13 11/03/2022   CREATININE 0.64 11/03/2022   BILITOT 0.6 11/03/2022   ALKPHOS 51 05/01/2022   AST 14 11/03/2022   ALT 14 11/03/2022   PROT 6.0 (L) 11/03/2022   ALBUMIN 3.7 05/01/2022   CALCIUM 9.6 11/03/2022   GFRAA 90 12/27/2018   QFTBGOLDPLUS Negative 11/28/2021    Speciality Comments: No specialty comments available.  Procedures:  No procedures performed Allergies: Erythromycin, Azathioprine, Sulfa antibiotics, Azithromycin, Condrolite [glucosamine-chondroitin-msm], Glucosamine, Glucosamine sulfate-msm, and Sulfamethoxazole-trimethoprim   Assessment / Plan:     Visit  Diagnoses: Rheumatoid arthritis involving multiple sites with positive rheumatoid factor (HCC) - Plan: C-reactive protein  Does not have significant joint inflammation on exam today.  Overall she has been doing much better since the switch to subcutaneous methotrexate.  Will check CRP for inflammatory activity monitoring.  Plan to continue methotrexate 12.5 mg subcu weekly and folic acid 1 mg daily and prednisone 5 mg daily.  High risk medication use - Plan:  CBC with Differential/Platelet, COMPLETE METABOLIC PANEL WITH GFR  Checking CBC and CMP for medication monitoring on long-term use of methotrexate.  No serious interval infections.  Discussed low-dose prednisone could be contributing to some of the bruising and skin fragility but is also age related and manageable.  Orders: Orders Placed This Encounter  Procedures   C-reactive protein   CBC with Differential/Platelet   COMPLETE METABOLIC PANEL WITH GFR   No orders of the defined types were placed in this encounter.    Follow-Up Instructions: Return in about 3 months (around 02/03/2023) for RA/lung on MTX/GC f/u 3mos.   Fuller Plan, MD  Note - This record has been created using [employer].  Chart creation errors have been sought, but may not always  have been located. Such creation errors do not reflect on  the standard of medical care.

## 2022-11-03 ENCOUNTER — Ambulatory Visit: Payer: Medicare Other | Attending: Internal Medicine | Admitting: Internal Medicine

## 2022-11-03 ENCOUNTER — Encounter: Payer: Self-pay | Admitting: Internal Medicine

## 2022-11-03 VITALS — BP 110/74 | HR 142 | Resp 12 | Ht 62.0 in | Wt 159.0 lb

## 2022-11-03 DIAGNOSIS — Z79899 Other long term (current) drug therapy: Secondary | ICD-10-CM

## 2022-11-03 DIAGNOSIS — M0579 Rheumatoid arthritis with rheumatoid factor of multiple sites without organ or systems involvement: Secondary | ICD-10-CM

## 2022-11-03 LAB — CBC WITH DIFFERENTIAL/PLATELET
Absolute Monocytes: 402 cells/uL (ref 200–950)
Eosinophils Absolute: 18 cells/uL (ref 15–500)
HCT: 46.5 % — ABNORMAL HIGH (ref 35.0–45.0)
MCHC: 32.7 g/dL (ref 32.0–36.0)
Neutrophils Relative %: 80.2 %
Platelets: 256 10*3/uL (ref 140–400)
RBC: 5.27 10*6/uL — ABNORMAL HIGH (ref 3.80–5.10)

## 2022-11-04 ENCOUNTER — Ambulatory Visit (INDEPENDENT_AMBULATORY_CARE_PROVIDER_SITE_OTHER): Payer: Medicare Other

## 2022-11-04 ENCOUNTER — Ambulatory Visit (INDEPENDENT_AMBULATORY_CARE_PROVIDER_SITE_OTHER): Payer: Medicare Other | Admitting: Pulmonary Disease

## 2022-11-04 ENCOUNTER — Encounter: Payer: Self-pay | Admitting: Pulmonary Disease

## 2022-11-04 VITALS — BP 118/74 | HR 84 | Ht 62.0 in | Wt 159.4 lb

## 2022-11-04 DIAGNOSIS — J90 Pleural effusion, not elsewhere classified: Secondary | ICD-10-CM | POA: Diagnosis not present

## 2022-11-04 DIAGNOSIS — K658 Other peritonitis: Secondary | ICD-10-CM | POA: Diagnosis not present

## 2022-11-04 LAB — COMPLETE METABOLIC PANEL WITH GFR
AG Ratio: 1.9 (calc) (ref 1.0–2.5)
ALT: 14 U/L (ref 6–29)
AST: 14 U/L (ref 10–35)
Albumin: 3.9 g/dL (ref 3.6–5.1)
Alkaline phosphatase (APISO): 76 U/L (ref 37–153)
BUN: 13 mg/dL (ref 7–25)
CO2: 30 mmol/L (ref 20–32)
Calcium: 9.6 mg/dL (ref 8.6–10.4)
Chloride: 98 mmol/L (ref 98–110)
Creat: 0.64 mg/dL (ref 0.60–0.95)
Globulin: 2.1 g/dL (calc) (ref 1.9–3.7)
Glucose, Bld: 89 mg/dL (ref 65–99)
Potassium: 4.5 mmol/L (ref 3.5–5.3)
Sodium: 135 mmol/L (ref 135–146)
Total Bilirubin: 0.6 mg/dL (ref 0.2–1.2)
Total Protein: 6 g/dL — ABNORMAL LOW (ref 6.1–8.1)
eGFR: 88 mL/min/{1.73_m2} (ref >=60)

## 2022-11-04 LAB — CBC WITH DIFFERENTIAL/PLATELET
Basophils Absolute: 30 cells/uL (ref 0–200)
Basophils Relative: 0.5 %
Eosinophils Relative: 0.3 %
Hemoglobin: 15.2 g/dL (ref 11.7–15.5)
Lymphs Abs: 738 cells/uL — ABNORMAL LOW (ref 850–3900)
MCH: 28.8 pg (ref 27.0–33.0)
MCV: 88.2 fL (ref 80.0–100.0)
MPV: 11.3 fL (ref 7.5–12.5)
Monocytes Relative: 6.7 %
Neutro Abs: 4812 cells/uL (ref 1500–7800)
RDW: 16.6 % — ABNORMAL HIGH (ref 11.0–15.0)
Total Lymphocyte: 12.3 %
WBC: 6 10*3/uL (ref 3.8–10.8)

## 2022-11-04 LAB — C-REACTIVE PROTEIN: CRP: 5 mg/L (ref <8.0)

## 2022-11-04 NOTE — Progress Notes (Signed)
Synopsis: Referred in October 2022 for hospital follow up for pleural effusions  Subjective:   PATIENT ID: Rebecca Short GENDER: female DOB: 07-07-1940, MRN: [id number]  HPI  Chief Complaint  Patient presents with   Follow-up    1 mo f/u for pleural effusion. States she is feeling much better compared to her last visit.    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   She has been feeling well since last visit. She has been watching her salt and fluid intake closely and is down 5lbs from last visit. She did not go on an increased lasix regimen. She is working with PT once a week.    OV 09/22/22 She reports increasing dyspnea over the past week. Her prednisone was decreased to 2.5mg  daily at last visit. She reports a decrease in her tidal volume based on incentive spirometry measurements.   Chest x-ray today shows stable left effusion, but new small right pleural effusion.   Denies cough, fever, chills or sweats.  OV 09/04/22 She had covid vaccine on 3/19 and pneumococcal vaccine 3/15. She held her methotrexate for 2 weeks to get the vaccines. She remains on 5mg  of prednisone.  She has not been as active with her fitness routine. She is requesting referral to [facility].  OV 07/31/22 She has done well on the 5mg  of prednisone daily. No increased shortness of breath since last visit.   Chest radiograph done today shows stable left effusion.  OV 07/01/22 She has done well with tapering to 7.5mg  of prednisone since last visit. No increase in shortness of breath. She has been transitioned to IM methotrexate from PO and is tolerating it well.   Chest x-ray today shows slightly increased left effusion compared to last month.   OV 05/29/22 She was treated with Zpak at  last visit for possible lower respiratory infection based on chest radiograph with concern for right medial lower lobe infiltrate.   CRP and ESR were not elevated at last visit. Chest radiograph was stable.  She has not been doing her workout regimen as she has not felt like it. She has had issues with hypotension when taking her metoprolol. This has been held. She has felt like this over the past 2 weeks. Denies fevers, chills or sweats. Her appetite is down. She has noticed right shoulder discomfort intermittently.  OV 05/01/22 She was tapered to 10mg  daily at last visit on 11/20. She reports not feeling well since Monday with increased dyspnea, reduced appetite and changes in her blood pressure. She was not able to eat her cereal on Monday due to lack of appetite. She reports low BP readings with sBP in the 80s. Her metoprolol dose was decreased in half with improvement in her blood pressure but her baseline HR is 90s-100s. Her appetite has since improved. She does report lower extremity edema, right > left. Denies fevers, chills, sweats, nausea or vomiting.   OV 04/14/22 She was tapered to 12.5mg  of prednisone at last visit. She has been doing well since last visit. She continues methotrexate weekly.  OV 03/17/22 She was seen by Dr. Dimple Casey 03/03/22. She has continued on methotrexate 15mg  weekly with folic acid. She continues ton 15mg  of prednisone daily.  She has been feeling fine since last visit. Repeat Chest  x-ray shows persistent left pleural effusion.   OV 01/21/22 She started methotrexate therapy 7/31 and is currently on 15mg  weekly with folic acid 1mg  daily. She has continued on 15mg  prednisone daily. Recommended 2 months of methotrexate treatment prior to tapering of her steroids.  She was evalauted by CT surgery, PET scan is negative. She was offered VATs with mechanical pleuredesis or pleurX catheter placement but has opted not to pursue these options at this time. Chest radiograph 8/11  shows mild increase in left pleural effusion.  Her symptoms remain stable at this time on the above regimen.   She expressed frustration with her health and not feeling completely better after the last year and she expresses concern with the immunosuppression side effects.   OV 12/06/21 She was tapered down to 10mg  of prednisone daily on 6/12. She reported  progressive fatigue and shortness of breath over week on 7/3. Chest x-ray showed an increase in her left pleural effusion as well as return of a small right pleural effusion. She was admitted 7/4 and left pigtail chest tube was placed on 7/5. She drained a total of of fluid with minimal drainage there after. CT chest scan after chest tube placement showed LLL infiltrate vs atelecatsis and resolution of the effusion on the left. There was no need for pleural lytic therapy and her lung expanded appropriately. The effusion was again exudative. Cytology was negative for malignancy.  Her prednisone was increased back to 20mg  daily an she continued on azathioprine 100mg  daily. She is feeling much better since before the hospital.  Dr. Dimple Casey of rheumatology was notified. We discussed transition to methotrexate from azathioprine.   Her pleural fluid ADA was not elevated and the AFB smear on pleural fluid is negative. Quantiferon gold is negative.   Chest radiograph today shows return of left pleural effusion, but not as significant as her 7/3 chest x-ray. Right effusion has decreased or resolved.   OV 09/3021 She was seen by Rebecca Croft, NP on 09/27/21 for acute visit due to worsening joint pains and concern for worsening of her pleural effusion. Chest radiograph was overall stable compared to March. It was recommended she continue on the scheduled prednisone taper.   Since that visit she has not had further issues with joint pains or return of dyspnea. She overall feels well. She is currently taking 15mg  prednisone daily and has stopped atovaquone  antibiotic.  She complains of ear fullness and having trouble hearing and is scheduled to see ENT next month.   OV 09/13/21 She remains on slow prednisone taper and is down to 20mg  daily along with atovaquone prophylaxis. She saw Dr. Dimple Casey of rheumatology on 4/17 with no changes to her regimen.   She continues to feel well and continues her weekly exercise and strength regimen. She feels her stamina continues to improve.  OV 08/20/21 She continues to do well since last visit and her stamina is increasing and her balance is getting better. She is getting out to walk in the local parks. She is currently taking 25mg  prednisone daily and atovaquone daily.   OV 07/23/21 She continues on imuran therapy 100mg  daily which has been increased from 50mg  daily by Dr. Dimple Casey her rheumatologist, note from 07/09/21 reviewed. She has started her prednisone taper and is currently taking 30mg  daily. She remains on atovaquone prophylaxis. 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  She has done well since last visit. No increase in shortness of breath.  She continues to progress with her physical activity.  OV 06/25/21 She has been doing well since last visit. She was started on imuran per Dr. Dimple Casey and she has follow up with him on 07/09/21. She remains on 40mg  of prednisone daily. She is working with home PT with good results. She reports her daily activities are increasing as she is now able to vacuum 2 rooms in her home compared to 1 previously.   Chest radiograph today shows chronic left pleural effusion, stable from last month.  OV 05/22/21 She was evaluated by Dr. 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 Anti-smooth muscle anitbody is elevated. Cryoglobulins are negative.   Discussed pleural fluid studies further with Dr. Kenard Gower which showed mixed granulocytes and histioctyes along with some multinucleated  giant cells. A definite diagnosis can't be placed on these findings, but he did raise concern for rheumatoid arthritis.   These upates were shared with the patient, her husband and friend.   Chest x-ray today shows persistent left pleural effusion and clear right lung.   Patient is feeling much better after increasing her prednisone back to 40mg  daily. She remains on prophylactic antibiotics. Her shoulder pain has also resolved with the prednisone.   She continues to work with PT each week and feels her conditioning in improving.  OV 04/24/21 She was seen in acute visit on 11/28 by Rebecca Manis, NP for increasing shortness of breath and pleuritic chest pain. She had tapered down to 5mg  of prednisone at this time. She had repeat thoracentesis on 04/08/21 based on CT chest from 04/22/21 which continued to show persistent left pleural effusion. Post-thoracentesis x-ray showed left pleural effusion and small right pleural effusion. Chest x-ray on 11/28 shows increased left pleural effusion.   Patient complains of increasing shortness of breath, bilateral shoulder pain and increasing left-sided discomfort.  Labs from visit on 11/16 showed rheumatoid factor I30 previously 57.2 on 01/21/2021.  P ANCA titer was 1:80 on 04/10/2021.  The following labs were negative on 04/10/2021: ANA, anti-CCP, dsDNA antibody and previously negative for SSA and SSB and antihistone antibodies.  OV 03/06/21 She was admitted 8/27 to 9/7 for pericardial effusion and bilateral pleural effusions. She is status post pericardial drain and multiple thoracenteses. The pleural fluid studies were consistent with exudative process which were initially neutrophil predominant that later became lymphocyte predominant. Cytology was negative for malignancy but showed reactive mesothelial cells.  She is being treated for serositis with prednisone taper and colchicine for the pericardial effusion. She has been on 40mg  of prednisone over the  past month. She has been on pneumocystis prophylaxis with atovaquone, she developed skin rash with bactrim. Her inflammatory workup was rather unrevealing as ANA, CCP anti-histone ab and SSA/SSB were negative. Her rheumatoid factor was elevated at 57.2.   She is feeling significantly better since discharge is resuming her normal activity and is quite satisfied with her recovering thus far.   Chest radiograph today shows opacities at the left lung base. The right lung is significantly improved since prior study with resolution of right pleural effusion.  Past Medical History:  Diagnosis Date   A-fib [facility])    Allergy    Atrial fibrillation (HCC)    FH: cholecystectomy 1997   Macular degeneration    OSA (obstructive sleep apnea)    mild with total AHI 7.25/hr and severe during REM sleep at 35/hr now on CPAP at 6cm H2O     Family History  Problem  Relation Age of Onset   Osteoporosis Mother    Macular degeneration Mother    Heart Problems Father    Stroke Father    Diabetes Brother    Kidney failure Brother    Cancer Maternal Grandmother    Dementia Paternal Grandmother    Suicidality Other    Heart attack Neg Hx      Social History   Socioeconomic History   Marital status: Married    Spouse name: Not on file   Number of children: Not on file   Years of education: Not on file   Highest education level: Not on file  Occupational History   Not on file  Tobacco Use   Smoking status: Never    Passive exposure: Past   Smokeless tobacco: Never  Vaping Use   Vaping Use: Never used  Substance and Sexual Activity   Alcohol use: Never   Drug use: Never   Sexual activity: Not on file  Other Topics Concern   Not on file  Social History Narrative   ** Merged History Encounter **       Social Determinants of Health   Financial Resource Strain: Not on file  Food Insecurity: No Food Insecurity (07/04/2022)   Hunger Vital Sign    Worried About Running Out of Food in the Last  Year: Never true    Ran Out of Food in the Last Year: Never true  Transportation Needs: No Transportation Needs (07/04/2022)   PRAPARE - Administrator, Civil Service (Medical): No    Lack of Transportation (Non-Medical): No  Physical Activity: Not on file  Stress: Not on file  Social Connections: Not on file  Intimate Partner Violence: Not on file     Allergies  Allergen Reactions   Erythromycin Other (See Comments)    Other reaction(s): burning   Azathioprine     Other Reaction(s): WEAKNESS,FATIGUE HAIR LOSS   Sulfa Antibiotics     Other reaction(s): Unknown   Azithromycin Other (See Comments)    Pain in arm with IV infusion    Condrolite [Glucosamine-Chondroitin-Msm] Rash   Glucosamine Rash    Other reaction(s): rash Other reaction(s): rash   Glucosamine Sulfate-Msm Rash   Sulfamethoxazole-Trimethoprim Rash    Other reaction(s): rash Other reaction(s): rash     Outpatient Medications Prior to Visit  Medication Sig Dispense Refill   atenolol (TENORMIN) 25 MG tablet Take half tablet by mouth daily as needed for heart rate >110 45 tablet 3   CALCIUM CITRATE-VITAMIN D PO Take 1 tablet by mouth at bedtime.     carboxymethylcellulose (REFRESH PLUS) 0.5 % SOLN Place 1 drop into both eyes 2 (two) times daily.     ELIQUIS 5 MG TABS tablet TAKE 1 TABLET BY MOUTH TWICE A DAY 60 tablet 6   EPIPEN 2-PAK 0.3 MG/0.3ML SOAJ injection Inject 0.3 mg into the muscle once as needed for anaphylaxis (severe allergic reaction).  0   famotidine (PEPCID) 10 MG tablet Take 1-2 tablets by mouth twice daily as needed 60 tablet 6   folic acid (FOLVITE) 1 MG tablet TAKE 1 TABLET BY MOUTH EVERY DAY 90 tablet 3   furosemide (LASIX) 20 MG tablet Take 1 tablet (20 mg total) by mouth daily. 90 tablet 3   methotrexate 50 MG/2ML injection INJECT 0.5 MLS INTO THE SKIN ONCE A WEEK. 4 mL 2   Multiple Vitamin (MULTIVITAMIN WITH MINERALS) TABS tablet Take 1 tablet by mouth every morning.     Multiple  Vitamins-Minerals (PRESERVISION AREDS PO) Take 1 capsule by mouth 2 (two) times daily.     potassium chloride (KLOR-CON) 20 MEQ packet USE 1 PACKET ONCE DAILY 90 packet 2   predniSONE (DELTASONE) 5 MG tablet TAKE 1 TABLET BY MOUTH EVERY DAY WITH BREAKFAST 30 tablet 1   TUBERCULIN SYR 1CC/26GX3/8" 26G X 3/8" 1 ML MISC Use for methotrexate injection once weekly 25 each 0   No facility-administered medications prior to visit.   Review of Systems  Constitutional:  Negative for chills, fever, malaise/fatigue and weight loss.  HENT:  Negative for congestion, sinus pain and sore throat.   Eyes: Negative.   Respiratory:  Positive for shortness of breath (with exertion). Negative for cough, hemoptysis, sputum production and wheezing.   Cardiovascular:  Negative for chest pain, palpitations, orthopnea, claudication and leg swelling.  Gastrointestinal:  Negative for abdominal pain, heartburn, nausea and vomiting.  Genitourinary: Negative.   Musculoskeletal:  Negative for joint pain and myalgias.  Skin:  Negative for rash.  Neurological:  Negative for weakness.  Endo/Heme/Allergies:  Bruises/bleeds easily.  Psychiatric/Behavioral: Negative.     Objective:   Vitals:   11/04/22 0925  BP: 118/74  Pulse: 84  SpO2: 96%  Weight: 159 lb 6.4 oz (72.3 kg)  Height: 5\' 2"  (1.575 m)    Physical Exam Constitutional:      General: She is not in acute distress.    Appearance: She is not ill-appearing.  HENT:     Head: Normocephalic and atraumatic.  Cardiovascular:     Rate and Rhythm: Normal rate and regular rhythm.     Pulses: Normal pulses.     Heart sounds: Normal heart sounds. No murmur heard. Pulmonary:     Effort: Pulmonary effort is normal.     Breath sounds: Examination of the left-middle field reveals decreased breath sounds. Examination of the right-lower field reveals decreased breath sounds. Examination of the left-lower field reveals decreased breath sounds. Decreased breath sounds  present. No wheezing or rhonchi.  Musculoskeletal:     Right lower leg: No edema.     Left lower leg: No edema.  Skin:    General: Skin is warm and dry.  Neurological:     General: No focal deficit present.     Mental Status: She is alert.    CBC    Component Value Date/Time   WBC 6.0 11/03/2022 1550   RBC 5.27 (H) 11/03/2022 1550   HGB 15.2 11/03/2022 1550   HGB 15.7 12/27/2018 0831   HCT 46.5 (H) 11/03/2022 1550   HCT 47.4 (H) 12/27/2018 0831   PLT 256 11/03/2022 1550   PLT 213 12/27/2018 0831   MCV 88.2 11/03/2022 1550   MCV 90 12/27/2018 0831   MCH 28.8 11/03/2022 1550   MCHC 32.7 11/03/2022 1550   RDW 16.6 (H) 11/03/2022 1550   RDW 13.1 12/27/2018 0831   LYMPHSABS 738 (L) 11/03/2022 1550   MONOABS 0.7 05/01/2022 0933   EOSABS 18 11/03/2022 1550   BASOSABS 30 11/03/2022 1550      Latest Ref Rng & Units 11/03/2022    3:50 PM 08/04/2022   11:10 AM 05/01/2022    9:33 AM  BMP  Glucose 65 - 99 mg/dL 89  86  161   BUN 7 - 25 mg/dL 13  12  12    Creatinine 0.60 - 0.95 mg/dL 0.96  0.45  4.09   BUN/Creat Ratio 6 - 22 (calc) SEE NOTE:  SEE NOTE:    Sodium 135 - 146  mmol/L 135  137  133   Potassium 3.5 - 5.3 mmol/L 4.5  4.3  4.2   Chloride 98 - 110 mmol/L 98  100  95   CO2 20 - 32 mmol/L 30  31  33   Calcium 8.6 - 10.4 mg/dL 9.6  9.5  9.1    Chest imaging: CXR 07/31/22 Unchanged appearance of the chest x-ray, with persisting left-sided pleural effusion and associated atelectasis/consolidation  CXR 07/01/22 Moderate to large left pleural effusion, increased in size when compared with prior exam.  CXR 03/17/22 Unchanged left-sided pleural effusion with associated atelectasis/consolidation  CXR 12/06/21 Small left pleural effusion, decreased in size when compared with prior exam. Similar trace right pleural effusion.  CXR 11/25/21 1. Increased LEFT pleural effusion, now moderate to large in size. 2. Probable small RIGHT pleural effusion.  CXR 10/22/21 Stable left  effusion. No blunting of the right costophrenic angle.  CXR 09/27/21 Mild-to-moderate cardiomegaly. Again seen is the obscuration of the left cardiac border and hemidiaphragm of pleural effusion with likely superimposed atelectasis/consolidation without significant interval change. There is some blunting of the right CP angle seen of likely small pleural effusion  CXR 08/20/21 Similar appearance of the chest x-ray with left-sided pleural effusion and associated atelectasis/consolidation  CXR 06/25/21 1. Stable small left pleural effusion. 2. No new focal lung infiltrate.  CXR 05/22/21 There is a small to medium size left pleural effusion, not significantly changed since 04/24/2021. Aeration of the left lung is also not significantly changed. The previously seen small right effusion has resolved. The right lung is now clear. There is no pneumothorax.  CXR 04/22/21 Increasing LEFT effusion and associated airspace disease. Correlate with any signs of infection. Given persistence/worsening underlying pulmonary or bronchial lesion would be difficult to exclude. Correlate with results of prior thoracentesis and consider follow-up chest CT for further evaluation as warranted.   Trace RIGHT effusion with basilar atelectasis.  CXR 03/06/21 Resolved right-sided pleural effusion with persistent opacity on the left, likely a combination of persisting pleural fluid and associated atelectasis/consolidation  CXR 01/30/21 1. Slight interval decrease in size of the right pleural effusion with improved aeration of the right base. 2. No significant interval change of the larger left pleural effusion and adjacent airspace disease.  CT Chest 01/03/21 Moderate left and small right pleural effusions, which is increased on the left and new on the right in comparison to prior CT. Adjacent bibasilar and lingular atelectasis.   Increased, moderate-sized pericardial effusion. Biatrial enlargement.    Mild pulmonary edema.  PFT:     No data to display          Labs: 01/21/21: Quantiferon gold indeterminate  Path:  Echo 01/20/21:  1. Left ventricular ejection fraction, by estimation, is 60 to 65%. The  left ventricle has normal function. The left ventricle has no regional  wall motion abnormalities.   2. A small pericardial effusion is present. There is no evidence of  cardiac tamponade.  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  She has stable left pleural effusion and improvement of right pleural effusion on prednisone 5mg  daily currently and methotrexate.   Chest x-ray looks improved on the right today.   She is to continue 5mg   prednisone daily.   Follow up in 3 months.  Rebecca Comas, MD [facility] Office: [phone]   Current Outpatient Medications:    atenolol (TENORMIN) 25 MG tablet, Take half tablet by mouth daily as needed for heart rate >110, Disp: 45 tablet, Rfl: 3   CALCIUM CITRATE-VITAMIN D PO, Take 1 tablet by mouth at bedtime., Disp: , Rfl:    carboxymethylcellulose (REFRESH PLUS) 0.5 % SOLN, Place 1 drop into both eyes 2 (two) times daily., Disp: , Rfl:    ELIQUIS 5 MG TABS tablet, TAKE 1 TABLET BY MOUTH TWICE A DAY, Disp: 60 tablet, Rfl: 6   EPIPEN 2-PAK 0.3 MG/0.3ML SOAJ injection, Inject 0.3 mg into the muscle once as needed for anaphylaxis (severe allergic reaction)., Disp: , Rfl: 0   famotidine (PEPCID) 10 MG tablet, Take 1-2 tablets by mouth twice daily as needed, Disp: 60 tablet, Rfl: 6   folic acid (FOLVITE) 1 MG tablet, TAKE 1 TABLET BY MOUTH EVERY DAY, Disp: 90 tablet, Rfl: 3   furosemide (LASIX) 20 MG tablet, Take 1 tablet (20  mg total) by mouth daily., Disp: 90 tablet, Rfl: 3   methotrexate 50 MG/2ML injection, INJECT 0.5 MLS INTO THE SKIN ONCE A WEEK., Disp: 4 mL, Rfl: 2   Multiple Vitamin (MULTIVITAMIN WITH MINERALS) TABS tablet, Take 1 tablet by mouth every morning., Disp: , Rfl:    Multiple Vitamins-Minerals (PRESERVISION AREDS PO), Take 1 capsule by mouth 2 (two) times daily., Disp: , Rfl:    potassium chloride (KLOR-CON) 20 MEQ packet, USE 1 PACKET ONCE DAILY, Disp: 90 packet, Rfl: 2   predniSONE (DELTASONE) 5 MG tablet, TAKE 1 TABLET BY MOUTH EVERY DAY WITH BREAKFAST, Disp: 30 tablet, Rfl: 1   TUBERCULIN SYR 1CC/26GX3/8" 26G X 3/8" 1 ML MISC, Use for methotrexate injection once weekly, Disp: 25 each, Rfl: 0

## 2022-11-04 NOTE — Patient Instructions (Signed)
Your chest x-ray looks improved on the right lung and possibly slightly improved on the left  Continue 5mg  prednisone daily  Follow up in 3 months

## 2022-11-04 NOTE — Progress Notes (Signed)
Lab results look great on the current treatment. CRP is normal. Lymphocyte count of 738 is slightly below normal but better than before. No problem with kidney or liver function.

## 2022-11-16 ENCOUNTER — Other Ambulatory Visit: Payer: Self-pay | Admitting: Interventional Cardiology

## 2022-11-16 DIAGNOSIS — I480 Paroxysmal atrial fibrillation: Secondary | ICD-10-CM

## 2022-11-17 NOTE — Telephone Encounter (Signed)
Prescription refill request for Eliquis received. Indication:afib Last office visit:2/24 Scr:0.64  6/24 Age: 82 Weight:72.3  kg  Prescription refilled

## 2022-11-27 ENCOUNTER — Other Ambulatory Visit: Payer: Self-pay | Admitting: Internal Medicine

## 2022-11-27 DIAGNOSIS — M0579 Rheumatoid arthritis with rheumatoid factor of multiple sites without organ or systems involvement: Secondary | ICD-10-CM

## 2022-12-16 ENCOUNTER — Other Ambulatory Visit: Payer: Self-pay | Admitting: Pulmonary Disease

## 2022-12-16 DIAGNOSIS — J90 Pleural effusion, not elsewhere classified: Secondary | ICD-10-CM

## 2022-12-16 NOTE — Telephone Encounter (Signed)
Pt needs a refill for predniSONE (DELTASONE) 5 MG tablet   Pharmacy [id number] - [place], [place] - [address]

## 2022-12-17 ENCOUNTER — Telehealth: Payer: Self-pay | Admitting: *Deleted

## 2022-12-17 MED ORDER — PREDNISONE 5 MG PO TABS
5.0000 mg | ORAL_TABLET | Freq: Every day | ORAL | 3 refills | Status: DC
Start: 2022-12-17 — End: 2023-04-03

## 2022-12-17 NOTE — Telephone Encounter (Signed)
Pt states [facility] is stating [facility] have not received anything for pt prescription for Prednisone. Pt states she has 1 pill left and needs a refill for her predniSONE (DELTASONE) 5 MG tablet   Pharmacy [id number] - [place], [place] - [address]

## 2022-12-17 NOTE — Telephone Encounter (Signed)
Patient contacted the office requesting a refill on MTX to be [facility] [place]. Patient advised she should have a refill left with the pharmacy as a 30 day supply with 2 refills was sent on 10/01/2022. Patient expressed understanding and states she will go by the pharmacy to talk with them. Patient advised to call the office if she has any trouble getting her refills.

## 2022-12-17 NOTE — Telephone Encounter (Signed)
Spoke with patient to let her know her script was sent into her pharmacy for prednisone 5mg . Patient's voice was understanding.Nothing else further needed at this time.

## 2022-12-17 NOTE — Telephone Encounter (Signed)
Patient requesting refill for Prednisone 5mg  0 refill's left   Dr.Dewald is it ok to refill Prednisone 5mg  to pharmacy of choice.  Thank you

## 2022-12-17 NOTE — Telephone Encounter (Signed)
Yes ok to refill 

## 2022-12-17 NOTE — Telephone Encounter (Signed)
ATC patient x1.  LVM to return call.  Prednisone has already been sent to her pharmacy, [facility] in [place].

## 2023-01-03 ENCOUNTER — Other Ambulatory Visit: Payer: Self-pay | Admitting: Interventional Cardiology

## 2023-01-10 ENCOUNTER — Other Ambulatory Visit: Payer: Self-pay | Admitting: Internal Medicine

## 2023-01-10 DIAGNOSIS — M0579 Rheumatoid arthritis with rheumatoid factor of multiple sites without organ or systems involvement: Secondary | ICD-10-CM

## 2023-01-12 NOTE — Telephone Encounter (Signed)
Last Fill: 10/01/2022  Labs: 11/03/2022 Lab results look great on the current treatment. CRP is normal. Lymphocyte count of 738 is slightly below normal but better than before. No problem with kidney or liver function.   Next Visit: 02/03/2023  Last Visit: 11/03/2022  DX: Rheumatoid arthritis involving multiple sites with positive rheumatoid factor   Current Dose per office note 11/03/2022: methotrexate 12.5 mg subcu weekly   Okay to refill Methotrexate?

## 2023-01-20 NOTE — Progress Notes (Signed)
Office Visit Note  Patient: Rebecca Short             Date of Birth: [birth year]           MRN: [id number]             PCP: Daisy Floro, MD Referring: Daisy Floro, MD Visit Date: 02/03/2023   Subjective:  Follow-up (Patient states the prescription for the methotrexate shows take 0.6ML and states she is supposed to be taking 0.5ML. Patient would like it to be corrected. Patient states she would like to know when she can get her vaccinations and when to stop the methotrexate. )   History of Present Illness: Rebecca Short is a 82 y.o. female here for follow up for seropositive RA primarily pleural effusion involvement on MTX 12.5 mg Bon[place]arte weekly and folic acid 1 mg daily and prednisone 5 mg daily.  Since our last visit symptoms are stable and doing pretty well from a functional standpoint.  She is working with physical therapy on her mobility and balance.  Feels her shortness of breath with movement and talking is partially improved.  Last x-ray with Dr. Francine Graven in June with stable left pleural effusion possible improvement in right side.  Still has persistent bruising on extremities no new skin tears.  Previous HPI 11/03/2022 Rebecca Short is a 82 y.o. female here for follow up for seropositive RA on MTX 12.5 mg [place] weekly and folic acid 1 mg daily and prednisone 5 mg daily.  Since our last visit she tapered prednisone down to 5 mg daily which was tolerable but subsequent tapering to 2.5 mg caused worsening of symptoms with shortness of breath, decreased lung volume on at home incentive spirometry, and was found to have right lung effusion at pulmonology clinic evaluation.  She is now back to 5 mg daily.  Also had some adjustment of Lasix dosing for pedal edema with elevated BNP is currently taking 20 mg daily.   Previous HPI 08/04/22 Rebecca Short is a 82 y.o. female here for follow up for seropositive RA association with serositis and persistent left pleural effusion on methotrexate 15  mg subcu weekly and folic acid 1 mg daily and prednisone 5 mg daily.  Since switching from oral to subcu methotrexate she had a great improvement in medication tolerance no longer feeling nauseated and extremely fatigued for 2 to 3 days after each dose.  Right shoulder pain is doing much better after the steroid injection in January as well.  She continued prednisone tapering with close pulmonology follow-up down to 5 mg daily now and has not noticed any significant worsening of symptoms.  She is interested in getting off the remaining steroids also interested in getting updated vaccines to feel safer with more social contacts.   Previous HPI 06/04/22 Rebecca Short is a 82 y.o. 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 She had some exacerbation with edema and dyspnea but stable pleural effusion on imaging. Labs with slightly worsening of lymphopenia last month as well.  She is noticing what she thinks is a bit more nausea and fatigue type side effects after the methotrexate weekly dose than before taking up to a few days to fully recover rather than less than 1.  Since tapering the steroid dose down to 7.5 mg about a week ago she is experiencing increased in shoulder  pain and stiffness on both sides.     Previous HPI 03/03/22 Rebecca Short is a 82 y.o. 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 So far her respiratory symptoms are doing well since last visit. Her mood and energy level are very poor, and frustrated about inability to socialize and limited exertion tolerance. She reports alternating days of more and less energy, sometimes associated with the methotrexate doses on Mondays but not always. Also having some concentration and memory difficulties increased from before for a few months.   Previous  HPI 12/30/2021 Rebecca Short is a 82 y.o. 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 She was hospitalized last month with recurrent pleural effusion 7/4-7/7 requiring repeat thoracocentesis. Since discharge she is back onto higher dose prednisone and symptoms are doing okay. We stopped her azathioprine due to apparent treatment failure and she started methotrexate since 7/31 after negative hepatitis screening. She has not seen any problems with GI intolerance. She does feel somewhat unsteady or lightheaded and having to move slowly on account of this.   Previous HPI 09/09/2021 Rebecca Short is a 82 y.o. 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 She is doing well since last visit without major events. She has some mild GI irritation with the azathioprine. She has significant bruising on her hands and forearms. She saw Dr. Francine Graven for follow up with stable pulmonary symptoms and repeat chest xray obtained 3/28 was also stable. She is tapering the prednisone of a rate about 5 mg/day dose per month currently down to 20 mg daily. She is curious about safety going to the dentist for procedures on her medications.   Previous HPI 07/09/21 Rebecca Short is a 82 y.o. female here for follow up for rheumatoid pleural effusions after starting azathioprine 50 mg last month, continuing prednisone 40 mg daily and prophylactic atovaquone. She went for repeat chest xray with Dr. Francine Graven that looked stable 2 weeks ago. So far she denies any major side effects after starting imuran.   Previous HPI 05/08/21 Rebecca Short is a 82 y.o. female here for evaluation with recurrent pleural effusions and dyspnea with positive RF and ANCA Abs. She was hospitalized in August after progressively worsening dyspnea and hypotension with exudative pleural fluid removed from left lung  space followed by pericardial drainage and initial treatment with colchicine. She subsequently underwent additional thoracentesis drainage of effusions later in August and beginning of September. Results showed neutrophil predominant fluid. Prednisone 40 mg daily was added to treatment with stable condition. This continued for a month but tapering the prednisone resulted in more rapid re accumulation of fluid on a dose of about 20 gm daily or less. Imaging demonstrated progressive increase again in left sided effusions and repeat drainage again on 11/14 and 11/30 when down to 10 mg and 5 mg daily doses of prednisone along with colchicine. She is now continuing prednisone 40 mg with atovaquone prophylaxis and colchicine 0.6 mg daily. In addition to pulmonary symptoms she reports bilateral shoulder pain and stiffness developing around the initial onset of her dyspnea before hospitalization in August.  This improved completely on 40 mg prednisone but she noticed definite recurrence of shoulder pain symptoms when tapering down the prednisone to 10  or 5 mg/day dose.  No active complaints today since she is back on higher dose medication.  She denies any peripheral joint pain involvement did not notice any significant swelling or numbness in her upper extremities associated with the pain.   Review of Systems  Constitutional:  Negative for fatigue.  HENT:  Negative for mouth sores and mouth dryness.   Eyes:  Positive for dryness.  Respiratory:  Negative for shortness of breath.   Cardiovascular:  Positive for palpitations. Negative for chest pain.  Gastrointestinal:  Negative for blood in stool, constipation and diarrhea.  Endocrine: Negative for increased urination.  Genitourinary:  Positive for involuntary urination.  Musculoskeletal:  Positive for gait problem. Negative for joint pain, joint pain, joint swelling, myalgias, muscle weakness, morning stiffness, muscle tenderness and myalgias.  Skin:  Positive  for color change, hair loss and sensitivity to sunlight. Negative for rash.  Allergic/Immunologic: Positive for susceptible to infections.  Neurological:  Negative for dizziness and headaches.  Hematological:  Negative for swollen glands.  Psychiatric/Behavioral:  Negative for depressed mood and sleep disturbance. The patient is not nervous/anxious.     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   Pleural effusion 11/26/2021   Serositis (HCC) 09/27/2021   Fatigue 09/27/2021   Macular pucker, left eye 06/18/2021   High risk medication use 05/30/2021   Bilateral shoulder pain 05/08/2021   Vitamin D deficiency 05/08/2021   Abnormal ANCA test 05/08/2021   RA (rheumatoid arthritis) (HCC) 04/22/2021   S/P thoracentesis    Arterial hypotension    Pleural effusion on left    Pericardial effusion    Leukocytosis 01/03/2021   Community acquired pneumonia 01/03/2021   Atrial fibrillation (HCC) 12/24/2020   Atrial fibrillation with RVR (HCC) 12/23/2020   Musculoskeletal pain 12/23/2020   Posterior vitreous detachment of both eyes 04/30/2020   Macular pucker, right eye 11/08/2019   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HCC) 11/08/2019   Intermediate stage nonexudative age-related macular degeneration of both eyes 11/08/2019   Anticoagulated 11/18/2016   Bradycardia 03/23/2014   OSA (obstructive sleep apnea) 01/19/2014   Obesity (BMI 30-39.9) 01/19/2014   Paroxysmal A-fib (HCC) 10/04/2013   Nonspecific abnormal unspecified cardiovascular function study 07/21/2013   Acute gastroenteritis 07/15/2013    Past Medical History:  Diagnosis Date   A-fib [facility])    Allergy    Atrial fibrillation (HCC)    FH: cholecystectomy 1997   Macular degeneration    OSA (obstructive sleep apnea)    mild with total AHI 7.25/hr and severe during REM sleep at 35/hr now  on CPAP at 6cm H2O    Family History  Problem Relation Age of Onset   Osteoporosis Mother    Macular degeneration Mother    Heart Problems Father    Stroke Father    Diabetes Brother    Kidney failure Brother    Cancer Maternal Grandmother    Dementia Paternal Grandmother    Suicidality Other    Heart attack Neg Hx    Past Surgical History:  Procedure Laterality Date   CHEST TUBE INSERTION Left 11/27/2021   Procedure: CHEST TUBE INSERTION;  Surgeon: Martina Sinner, MD;  Location: [facility] ENDOSCOPY;  Service: Pulmonary;  Laterality: Left;  Prefer AM slot, thanks   CHOLECYSTECTOMY     PERICARDIOCENTESIS N/A 01/07/2021   Procedure: PERICARDIOCENTESIS;  Surgeon: Yvonne Kendall, MD;  Location: [facility] INVASIVE CV LAB;  Service: Cardiovascular;  Laterality: N/A;   REPLACEMENT TOTAL KNEE BILATERAL     THORACENTESIS N/A 01/25/2021   Procedure: THORACENTESIS;  Surgeon: Martina Sinner, MD;  Location: [facility] ENDOSCOPY;  Service: Pulmonary;  Laterality: N/A;   THORACENTESIS N/A 04/08/2021   Procedure: THORACENTESIS;  Surgeon: Martina Sinner, MD;  Location: [facility] ENDOSCOPY;  Service: Pulmonary;  Laterality: N/A;   THORACENTESIS N/A 04/24/2021   Procedure: THORACENTESIS;  Surgeon: Lupita Leash, MD;  Location: [facility] ENDOSCOPY;  Service: Cardiopulmonary;  Laterality: N/A;   Social History   Social History Narrative   ** Merged History Encounter **       Immunization History  Administered Date(s) Administered   COVID-19, mRNA, vaccine(Comirnaty)12 years and older 08/09/2022   Influenza, High Dose Seasonal PF 07/05/2014, 03/03/2015, 06/29/2015, 07/09/2016, 03/03/2017, 07/24/2017, 02/05/2018, 06/23/2018, 06/08/2019, 02/18/2020, 06/08/2020   Influenza, Quadrivalent, Recombinant, Inj, Pf 03/04/2019   Influenza-Unspecified 01/24/2014   PFIZER(Purple Top)SARS-COV-2 Vaccination 06/16/2019, 07/07/2019, 02/18/2020, 06/08/2020   Pneumococcal Polysaccharide-23 03/22/2015, 06/29/2015, 07/09/2016,  07/24/2017, 06/23/2018, 06/08/2019, 06/08/2020, 06/03/2021   Zoster Recombinant(Shingrix) 03/09/2017     Objective: Vital Signs: BP 134/85 (BP Location: Left Arm, Patient Position: Sitting, Cuff Size: Normal)   Pulse 92   Resp 12   Ht 5\' 2"  (1.575 m)   Wt 158 lb (71.7 kg)   BMI 28.90 kg/m    Physical Exam Eyes:     Conjunctiva/sclera: Conjunctivae normal.  Cardiovascular:     Rate and Rhythm: Normal rate and regular rhythm.  Pulmonary:     Effort: Pulmonary effort is normal.     Comments: Decreased breath sounds in left lower lung field Musculoskeletal:     Right lower leg: Edema present.     Left lower leg: Edema present.     Comments: 2+ pitting edema stops about one third between ankle and knees no erythema or hyperpigmentation  Lymphadenopathy:     Cervical: No cervical adenopathy.  Skin:    General: Skin is warm and dry.     Findings: Bruising present.  Neurological:     Mental Status: She is alert.  Psychiatric:        Mood and Affect: Mood normal.      Musculoskeletal Exam:  Shoulders full ROM no tenderness or swelling Elbows full ROM no tenderness or swelling Wrists full ROM no tenderness or swelling Fingers full ROM no tenderness or swelling, 1st digit squaring and DIP heberdon's nodes Knees full ROM no tenderness or swelling  Investigation: No additional findings.  Imaging: No results found.  Recent Labs: Lab Results  Component Value Date   WBC 6.0 11/03/2022   HGB 15.2 11/03/2022   PLT 256 11/03/2022   NA 135 11/03/2022   K 4.5 11/03/2022   CL 98 11/03/2022   CO2 30 11/03/2022   GLUCOSE 89 11/03/2022   BUN 13 11/03/2022   CREATININE 0.64 11/03/2022   BILITOT 0.6 11/03/2022   ALKPHOS 51 05/01/2022   AST 14 11/03/2022   ALT 14 11/03/2022   PROT 6.0 (L) 11/03/2022   ALBUMIN 3.7 05/01/2022   CALCIUM 9.6 11/03/2022   GFRAA 90 12/27/2018   QFTBGOLDPLUS Negative 11/28/2021    Speciality Comments: No specialty comments  available.  Procedures:  No procedures performed Allergies: Erythromycin, Azathioprine, Sulfa antibiotics, Azithromycin, Condrolite [glucosamine-chondroitin-msm], Glucosamine, Glucosamine sulfate-msm, and Sulfamethoxazole-trimethoprim   Assessment / Plan:     Visit Diagnoses: Rheumatoid arthritis involving multiple sites with positive rheumatoid factor (HCC) - Plan: methotrexate 50 MG/2ML injection, Sedimentation rate  She is doing really well  the past few months and better tolerance on the injectable methotrexate.  No appreciable joint inflammation has chronic degenerative changes on exam.  Pulmonary symptoms appear stable.  Rechecking sedimentation rate for disease activity monitoring.  Plan to continue methotrexate 12.5 mg subcu weekly folic acid 1 mg daily and prednisone 5 mg daily.  Pleural effusion on left side  Ongoing follow-up with Dr. Francine Graven dyspnea on exertion symptoms currently stable or slightly better associated with physical therapy and continue medical treatment.  No new exacerbation since 3 months ago.  High risk medication use - methotrexate 12.5 mg subcu weekly and folic acid 1 mg daily - Plan: CBC with Differential/Platelet, COMPLETE METABOLIC PANEL WITH GFR  Checking CBC and CMP for medication monitoring on long-term continued use of methotrexate.  No serious interval infections.  Bruising side effect of steroids persists but without skin tear or other major complication.  Discussed drug management around annual vaccinations recommending interruption of methotrexate for 2 weeks following.  Orders: Orders Placed This Encounter  Procedures   Sedimentation rate   CBC with Differential/Platelet   COMPLETE METABOLIC PANEL WITH GFR   Meds ordered this encounter  Medications   methotrexate 50 MG/2ML injection    Sig: Inject 0.5 mLs (12.5 mg total) into the skin once a week.    Dispense:  4 mL    Refill:  1     Follow-Up Instructions: No follow-ups on  file.   Fuller Plan, MD  Note - This record has been created using [employer].  Chart creation errors have been sought, but may not always  have been located. Such creation errors do not reflect on  the standard of medical care.

## 2023-02-03 ENCOUNTER — Encounter: Payer: Self-pay | Admitting: Internal Medicine

## 2023-02-03 ENCOUNTER — Ambulatory Visit: Payer: Medicare Other | Attending: Internal Medicine | Admitting: Internal Medicine

## 2023-02-03 VITALS — BP 134/85 | HR 92 | Resp 12 | Ht 62.0 in | Wt 158.0 lb

## 2023-02-03 DIAGNOSIS — Z79899 Other long term (current) drug therapy: Secondary | ICD-10-CM

## 2023-02-03 DIAGNOSIS — Z7952 Long term (current) use of systemic steroids: Secondary | ICD-10-CM | POA: Diagnosis not present

## 2023-02-03 DIAGNOSIS — M0579 Rheumatoid arthritis with rheumatoid factor of multiple sites without organ or systems involvement: Secondary | ICD-10-CM | POA: Diagnosis not present

## 2023-02-03 DIAGNOSIS — J90 Pleural effusion, not elsewhere classified: Secondary | ICD-10-CM | POA: Diagnosis not present

## 2023-02-03 MED ORDER — METHOTREXATE SODIUM CHEMO INJECTION 50 MG/2ML: 12.5000 mg | INTRAMUSCULAR | 1 refills | Status: DC

## 2023-02-04 LAB — CBC WITH DIFFERENTIAL/PLATELET
Absolute Monocytes: 464 {cells}/uL (ref 200–950)
Basophils Absolute: 24 {cells}/uL (ref 0–200)
Basophils Relative: 0.3 %
Eosinophils Absolute: 24 {cells}/uL (ref 15–500)
Eosinophils Relative: 0.3 %
HCT: 46.2 % — ABNORMAL HIGH (ref 35.0–45.0)
Hemoglobin: 15.3 g/dL (ref 11.7–15.5)
Lymphs Abs: 472 {cells}/uL — ABNORMAL LOW (ref 850–3900)
MCH: 30.3 pg (ref 27.0–33.0)
MCHC: 33.1 g/dL (ref 32.0–36.0)
MCV: 91.5 fL (ref 80.0–100.0)
MPV: 11.3 fL (ref 7.5–12.5)
Monocytes Relative: 5.8 %
Neutro Abs: 7016 {cells}/uL (ref 1500–7800)
Neutrophils Relative %: 87.7 %
Platelets: 224 10*3/uL (ref 140–400)
RBC: 5.05 10*6/uL (ref 3.80–5.10)
RDW: 15 % (ref 11.0–15.0)
Total Lymphocyte: 5.9 %
WBC: 8 10*3/uL (ref 3.8–10.8)

## 2023-02-04 LAB — COMPLETE METABOLIC PANEL WITH GFR
AG Ratio: 1.9 (calc) (ref 1.0–2.5)
ALT: 16 U/L (ref 6–29)
AST: 13 U/L (ref 10–35)
Albumin: 4 g/dL (ref 3.6–5.1)
Alkaline phosphatase (APISO): 86 U/L (ref 37–153)
BUN: 13 mg/dL (ref 7–25)
CO2: 30 mmol/L (ref 20–32)
Calcium: 9.5 mg/dL (ref 8.6–10.4)
Chloride: 97 mmol/L — ABNORMAL LOW (ref 98–110)
Creat: 0.62 mg/dL (ref 0.60–0.95)
Globulin: 2.1 g/dL (ref 1.9–3.7)
Glucose, Bld: 91 mg/dL (ref 65–99)
Potassium: 4.8 mmol/L (ref 3.5–5.3)
Sodium: 135 mmol/L (ref 135–146)
Total Bilirubin: 0.7 mg/dL (ref 0.2–1.2)
Total Protein: 6.1 g/dL (ref 6.1–8.1)
eGFR: 89 mL/min/{1.73_m2} (ref >=60)

## 2023-02-04 LAB — SEDIMENTATION RATE: Sed Rate: 6 mm/h (ref 0–30)

## 2023-02-04 NOTE — Progress Notes (Signed)
Sedimentation rate remains normal at 6.  Her lymphocyte count is low at 472 but overall white blood cell count is fine I do not believe this requires any medication adjustment.  Kidney and liver function is normal.

## 2023-02-16 ENCOUNTER — Ambulatory Visit (INDEPENDENT_AMBULATORY_CARE_PROVIDER_SITE_OTHER): Payer: Medicare Other | Admitting: Pulmonary Disease

## 2023-02-16 ENCOUNTER — Encounter: Payer: Self-pay | Admitting: Pulmonary Disease

## 2023-02-16 ENCOUNTER — Ambulatory Visit (INDEPENDENT_AMBULATORY_CARE_PROVIDER_SITE_OTHER): Payer: Medicare Other

## 2023-02-16 VITALS — BP 120/74 | HR 78 | Ht 62.0 in | Wt 158.0 lb

## 2023-02-16 DIAGNOSIS — J90 Pleural effusion, not elsewhere classified: Secondary | ICD-10-CM

## 2023-02-16 NOTE — Progress Notes (Signed)
Synopsis: Referred in October 2022 for hospital follow up for pleural effusions  Subjective:   PATIENT ID: Rebecca Short GENDER: female DOB: 12-Jun-1940, MRN: [id number]  HPI  Chief Complaint  Patient presents with   Follow-up    Breathing has been overall doing well. She has been doing PT.    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   She has been feeling great since last visit. Her physical stamina continues to improve. She is doing PT 2 times per week.   OV 11/04/22 She has been feeling well since last visit. She has been watching her salt and fluid intake closely and is down 5lbs from last visit. She did not go on an increased lasix regimen. She is working with PT once a week.   OV 09/22/22 She reports increasing dyspnea over the past week. Her prednisone was decreased to 2.5mg  daily at last visit. She reports a decrease in her tidal volume based on incentive spirometry measurements.   Chest x-ray today shows stable left effusion, but new small right pleural effusion.   Denies cough, fever, chills or sweats.  OV 09/04/22 She had covid vaccine on 3/19 and pneumococcal vaccine 3/15. She held her methotrexate for 2 weeks to get the vaccines. She remains on 5mg  of prednisone.  She has not been as active with her fitness routine. She is requesting referral to [facility].  OV 07/31/22 She has done well on the 5mg  of prednisone daily. No increased shortness of breath since last visit.   Chest radiograph done today shows stable left effusion.  OV 07/01/22 She has done well with tapering to 7.5mg  of prednisone since last visit. No increase in shortness of breath. She has been transitioned to IM methotrexate from PO and is tolerating it well.   Chest x-ray today  shows slightly increased left effusion compared to last month.   OV 05/29/22 She was treated with Zpak at last visit for possible lower respiratory infection based on chest radiograph with concern for right medial lower lobe infiltrate.   CRP and ESR were not elevated at last visit. Chest radiograph was stable.  She has not been doing her workout regimen as she has not felt like it. She has had issues with hypotension when taking her metoprolol. This has been held. She has felt like this over the past 2 weeks. Denies fevers, chills or sweats. Her appetite is down. She has noticed right shoulder discomfort intermittently.  OV 05/01/22 She was tapered to 10mg  daily at last visit on 11/20. She reports not feeling well since Monday with increased dyspnea, reduced appetite and changes in her blood pressure. She was not able to eat her cereal on Monday due to lack of appetite. She reports low BP readings with sBP in the 80s. Her metoprolol dose was decreased in half with improvement in her blood pressure but her baseline HR is 90s-100s. Her appetite has since improved. She does report lower extremity edema, right > left. Denies fevers, chills, sweats, nausea or vomiting.   OV 04/14/22 She was tapered to 12.5mg  of prednisone at last visit. She has been doing well since last visit. She continues methotrexate weekly.  OV 03/17/22 She was seen by Dr. Dimple Casey 03/03/22. She has continued on methotrexate 15mg  weekly with folic  acid. She continues ton 15mg  of prednisone daily.  She has been feeling fine since last visit. Repeat Chest x-ray shows persistent left pleural effusion.   OV 01/21/22 She started methotrexate therapy 7/31 and is currently on 15mg  weekly with folic acid 1mg  daily. She has continued on 15mg  prednisone daily. Recommended 2 months of methotrexate treatment prior to tapering of her steroids.  She was evalauted by CT surgery, PET scan is negative. She was offered VATs with mechanical pleuredesis  or pleurX catheter placement but has opted not to pursue these options at this time. Chest radiograph 8/11 shows mild increase in left pleural effusion.  Her symptoms remain stable at this time on the above regimen.   She expressed frustration with her health and not feeling completely better after the last year and she expresses concern with the immunosuppression side effects.   OV 12/06/21 She was tapered down to 10mg  of prednisone daily on 6/12. She reported  progressive fatigue and shortness of breath over week on 7/3. Chest x-ray showed an increase in her left pleural effusion as well as return of a small right pleural effusion. She was admitted 7/4 and left pigtail chest tube was placed on 7/5. She drained a total of of fluid with minimal drainage there after. CT chest scan after chest tube placement showed LLL infiltrate vs atelecatsis and resolution of the effusion on the left. There was no need for pleural lytic therapy and her lung expanded appropriately. The effusion was again exudative. Cytology was negative for malignancy.  Her prednisone was increased back to 20mg  daily an she continued on azathioprine 100mg  daily. She is feeling much better since before the hospital.  Dr. Dimple Casey of rheumatology was notified. We discussed transition to methotrexate from azathioprine.   Her pleural fluid ADA was not elevated and the AFB smear on pleural fluid is negative. Quantiferon gold is negative.   Chest radiograph today shows return of left pleural effusion, but not as significant as her 7/3 chest x-ray. Right effusion has decreased or resolved.   OV 09/3021 She was seen by [name], NP on 09/27/21 for acute visit due to worsening joint pains and concern for worsening of her pleural effusion. Chest radiograph was overall stable compared to March. It was recommended she continue on the scheduled prednisone taper.   Since that visit she has not had further issues with joint pains or return of  dyspnea. She overall feels well. She is currently taking 15mg  prednisone daily and has stopped atovaquone antibiotic.  She complains of ear fullness and having trouble hearing and is scheduled to see ENT next month.   OV 09/13/21 She remains on slow prednisone taper and is down to 20mg  daily along with atovaquone prophylaxis. She saw Dr. Dimple Casey of rheumatology on 4/17 with no changes to her regimen.   She continues to feel well and continues her weekly exercise and strength regimen. She feels her stamina continues to improve.  OV 08/20/21 She continues to do well since last visit and her stamina is increasing and her balance is getting better. She is getting out to walk in the local parks. She is currently taking 25mg  prednisone daily and atovaquone daily.   OV 07/23/21 She continues on imuran therapy 100mg  daily which has been increased from 50mg  daily by Dr. Dimple Casey her rheumatologist, note from 07/09/21 reviewed. She has started her prednisone taper and is currently taking 30mg  daily. She remains on atovaquone prophylaxis. 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  She has done well since last visit. No increase in shortness of breath. She continues to progress with her physical activity.  OV 06/25/21 She has been doing well since last visit. She was started on imuran per Dr. Dimple Casey and she has follow up with him on 07/09/21. She remains on 40mg  of prednisone daily. She is working with home PT with good results. She reports her daily activities are increasing as she is now able to vacuum 2 rooms in her home compared to 1 previously.   Chest radiograph today shows chronic left pleural effusion, stable from last month.  OV 05/22/21 She was evaluated by Dr. 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 Anti-smooth muscle anitbody is elevated. Cryoglobulins are negative.   Discussed pleural fluid studies  further with Dr. Kenard Gower which showed mixed granulocytes and histioctyes along with some multinucleated giant cells. A definite diagnosis can't be placed on these findings, but he did raise concern for rheumatoid arthritis.   These upates were shared with the patient, her husband and friend.   Chest x-ray today shows persistent left pleural effusion and clear right lung.   Patient is feeling much better after increasing her prednisone back to 40mg  daily. She remains on prophylactic antibiotics. Her shoulder pain has also resolved with the prednisone.   She continues to work with PT each week and feels her conditioning in improving.  OV 04/24/21 She was seen in acute visit on 11/28 by [name], NP for increasing shortness of breath and pleuritic chest pain. She had tapered down to 5mg  of prednisone at this time. She had repeat thoracentesis on 04/08/21 based on CT chest from 04/22/21 which continued to show persistent left pleural effusion. Post-thoracentesis x-ray showed left pleural effusion and small right pleural effusion. Chest x-ray on 11/28 shows increased left pleural effusion.   Patient complains of increasing shortness of breath, bilateral shoulder pain and increasing left-sided discomfort.  Labs from visit on 11/16 showed rheumatoid factor I30 previously 57.2 on 01/21/2021.  P ANCA titer was 1:80 on 04/10/2021.  The following labs were negative on 04/10/2021: ANA, anti-CCP, dsDNA antibody and previously negative for SSA and SSB and antihistone antibodies.  OV 03/06/21 She was admitted 8/27 to 9/7 for pericardial effusion and bilateral pleural effusions. She is status post pericardial drain and multiple thoracenteses. The pleural fluid studies were consistent with exudative process which were initially neutrophil predominant that later became lymphocyte predominant. Cytology was negative for malignancy but showed reactive mesothelial cells.  She is being treated for serositis with  prednisone taper and colchicine for the pericardial effusion. She has been on 40mg  of prednisone over the past month. She has been on pneumocystis prophylaxis with atovaquone, she developed skin rash with bactrim. Her inflammatory workup was rather unrevealing as ANA, CCP anti-histone ab and SSA/SSB were negative. Her rheumatoid factor was elevated at 57.2.   She is feeling significantly better since discharge is resuming her normal activity and is quite satisfied with her recovering thus far.   Chest radiograph today shows opacities at the left lung base. The right lung is significantly improved since prior study with resolution of right pleural effusion.  Past Medical History:  Diagnosis Date   A-fib [facility])    Allergy    Atrial fibrillation (HCC)    FH: cholecystectomy 1997   Macular degeneration    OSA (obstructive sleep apnea)    mild with total AHI 7.25/hr and severe  during REM sleep at 35/hr now on CPAP at 6cm H2O     Family History  Problem Relation Age of Onset   Osteoporosis Mother    Macular degeneration Mother    Heart Problems Father    Stroke Father    Diabetes Brother    Kidney failure Brother    Cancer Maternal Grandmother    Dementia Paternal Grandmother    Suicidality Other    Heart attack Neg Hx      Social History   Socioeconomic History   Marital status: Married    Spouse name: Not on file   Number of children: Not on file   Years of education: Not on file   Highest education level: Not on file  Occupational History   Not on file  Tobacco Use   Smoking status: Never    Passive exposure: Past   Smokeless tobacco: Never  Vaping Use   Vaping status: Never Used  Substance and Sexual Activity   Alcohol use: Never   Drug use: Never   Sexual activity: Not on file  Other Topics Concern   Not on file  Social History Narrative   ** Merged History Encounter **       Social Determinants of Health   Financial Resource Strain: Not on file  Food  Insecurity: No Food Insecurity (07/04/2022)   Hunger Vital Sign    Worried About Running Out of Food in the Last Year: Never true    Ran Out of Food in the Last Year: Never true  Transportation Needs: No Transportation Needs (07/04/2022)   PRAPARE - Administrator, Civil Service (Medical): No    Lack of Transportation (Non-Medical): No  Physical Activity: Not on file  Stress: Not on file  Social Connections: Not on file  Intimate Partner Violence: Not on file     Allergies  Allergen Reactions   Erythromycin Other (See Comments)    Other reaction(s): burning   Azathioprine     Other Reaction(s): WEAKNESS,FATIGUE HAIR LOSS   Sulfa Antibiotics     Other reaction(s): Unknown   Azithromycin Other (See Comments)    Pain in arm with IV infusion    Condrolite [Glucosamine-Chondroitin-Msm] Rash   Glucosamine Rash    Other reaction(s): rash Other reaction(s): rash   Glucosamine Sulfate-Msm Rash   Sulfamethoxazole-Trimethoprim Rash    Other reaction(s): rash Other reaction(s): rash     Outpatient Medications Prior to Visit  Medication Sig Dispense Refill   atenolol (TENORMIN) 25 MG tablet Take half tablet by mouth daily as needed for heart rate >110 45 tablet 3   CALCIUM CITRATE-VITAMIN D PO Take 1 tablet by mouth at bedtime.     carboxymethylcellulose (REFRESH PLUS) 0.5 % SOLN Place 1 drop into both eyes 2 (two) times daily.     ELIQUIS 5 MG TABS tablet TAKE 1 TABLET BY MOUTH TWICE A DAY 60 tablet 6   EPIPEN 2-PAK 0.3 MG/0.3ML SOAJ injection Inject 0.3 mg into the muscle once as needed for anaphylaxis (severe allergic reaction).  0   famotidine (PEPCID) 10 MG tablet Take 1-2 tablets by mouth twice daily as needed 60 tablet 6   folic acid (FOLVITE) 1 MG tablet TAKE 1 TABLET BY MOUTH EVERY DAY 90 tablet 3   furosemide (LASIX) 20 MG tablet TAKE 1 TABLET BY MOUTH EVERY DAY 90 tablet 1   methotrexate 50 MG/2ML injection Inject 0.5 mLs (12.5 mg total) into the skin once a week. 4  mL 1  Multiple Vitamin (MULTIVITAMIN WITH MINERALS) TABS tablet Take 1 tablet by mouth every morning.     Multiple Vitamins-Minerals (PRESERVISION AREDS PO) Take 1 capsule by mouth 2 (two) times daily.     potassium chloride (KLOR-CON) 20 MEQ packet USE 1 PACKET ONCE DAILY 90 packet 2   predniSONE (DELTASONE) 5 MG tablet Take 1 tablet (5 mg total) by mouth daily with breakfast. 30 tablet 3   TUBERCULIN SYR 1CC/26GX3/8" 26G X 3/8" 1 ML MISC Use for methotrexate injection once weekly 25 each 0   No facility-administered medications prior to visit.   Review of Systems  Constitutional:  Negative for chills, fever, malaise/fatigue and weight loss.  HENT:  Negative for congestion, sinus pain and sore throat.   Eyes: Negative.   Respiratory:  Positive for shortness of breath (with exertion). Negative for cough, hemoptysis, sputum production and wheezing.   Cardiovascular:  Negative for chest pain, palpitations, orthopnea, claudication and leg swelling.  Gastrointestinal:  Negative for abdominal pain, heartburn, nausea and vomiting.  Genitourinary: Negative.   Musculoskeletal:  Negative for joint pain and myalgias.  Skin:  Negative for rash.  Neurological:  Negative for weakness.  Endo/Heme/Allergies:  Bruises/bleeds easily.  Psychiatric/Behavioral: Negative.     Objective:   Vitals:   02/16/23 0926  BP: 120/74  Pulse: 78  SpO2: 97%  Weight: 158 lb (71.7 kg)  Height: 5\' 2"  (1.575 m)    Physical Exam Constitutional:      General: She is not in acute distress.    Appearance: She is not ill-appearing.  HENT:     Head: Normocephalic and atraumatic.  Cardiovascular:     Rate and Rhythm: Normal rate and regular rhythm.     Pulses: Normal pulses.     Heart sounds: Normal heart sounds. No murmur heard. Pulmonary:     Effort: Pulmonary effort is normal.     Breath sounds: Examination of the left-middle field reveals decreased breath sounds. Examination of the right-lower field reveals  decreased breath sounds. Examination of the left-lower field reveals decreased breath sounds. Decreased breath sounds present. No wheezing or rhonchi.  Musculoskeletal:     Right lower leg: No edema.     Left lower leg: No edema.  Skin:    General: Skin is warm and dry.  Neurological:     General: No focal deficit present.     Mental Status: She is alert.   CBC    Component Value Date/Time   WBC 8.0 02/03/2023 1112   RBC 5.05 02/03/2023 1112   HGB 15.3 02/03/2023 1112   HGB 15.7 12/27/2018 0831   HCT 46.2 (H) 02/03/2023 1112   HCT 47.4 (H) 12/27/2018 0831   PLT 224 02/03/2023 1112   PLT 213 12/27/2018 0831   MCV 91.5 02/03/2023 1112   MCV 90 12/27/2018 0831   MCH 30.3 02/03/2023 1112   MCHC 33.1 02/03/2023 1112   RDW 15.0 02/03/2023 1112   RDW 13.1 12/27/2018 0831   LYMPHSABS 472 (L) 02/03/2023 1112   MONOABS 0.7 05/01/2022 0933   EOSABS 24 02/03/2023 1112   BASOSABS 24 02/03/2023 1112      Latest Ref Rng & Units 02/03/2023   11:12 AM 11/03/2022    3:50 PM 08/04/2022   11:10 AM  BMP  Glucose 65 - 99 mg/dL 91  89  86   BUN 7 - 25 mg/dL 13  13  12    Creatinine 0.60 - 0.95 mg/dL 1.61  0.96  0.45   BUN/Creat Ratio 6 -  22 (calc) SEE NOTE:  SEE NOTE:  SEE NOTE:   Sodium 135 - 146 mmol/L 135  135  137   Potassium 3.5 - 5.3 mmol/L 4.8  4.5  4.3   Chloride 98 - 110 mmol/L 97  98  100   CO2 20 - 32 mmol/L 30  30  31    Calcium 8.6 - 10.4 mg/dL 9.5  9.6  9.5    Chest imaging: CXR 07/31/22 Unchanged appearance of the chest x-ray, with persisting left-sided pleural effusion and associated atelectasis/consolidation  CXR 07/01/22 Moderate to large left pleural effusion, increased in size when compared with prior exam.  CXR 03/17/22 Unchanged left-sided pleural effusion with associated atelectasis/consolidation  CXR 12/06/21 Small left pleural effusion, decreased in size when compared with prior exam. Similar trace right pleural effusion.  CXR 11/25/21 1. Increased LEFT pleural  effusion, now moderate to large in size. 2. Probable small RIGHT pleural effusion.  CXR 10/22/21 Stable left effusion. No blunting of the right costophrenic angle.  CXR 09/27/21 Mild-to-moderate cardiomegaly. Again seen is the obscuration of the left cardiac border and hemidiaphragm of pleural effusion with likely superimposed atelectasis/consolidation without significant interval change. There is some blunting of the right CP angle seen of likely small pleural effusion  CXR 08/20/21 Similar appearance of the chest x-ray with left-sided pleural effusion and associated atelectasis/consolidation  CXR 06/25/21 1. Stable small left pleural effusion. 2. No new focal lung infiltrate.  CXR 05/22/21 There is a small to medium size left pleural effusion, not significantly changed since 04/24/2021. Aeration of the left lung is also not significantly changed. The previously seen small right effusion has resolved. The right lung is now clear. There is no pneumothorax.  CXR 04/22/21 Increasing LEFT effusion and associated airspace disease. Correlate with any signs of infection. Given persistence/worsening underlying pulmonary or bronchial lesion would be difficult to exclude. Correlate with results of prior thoracentesis and consider follow-up chest CT for further evaluation as warranted.   Trace RIGHT effusion with basilar atelectasis.  CXR 03/06/21 Resolved right-sided pleural effusion with persistent opacity on the left, likely a combination of persisting pleural fluid and associated atelectasis/consolidation  CXR 01/30/21 1. Slight interval decrease in size of the right pleural effusion with improved aeration of the right base. 2. No significant interval change of the larger left pleural effusion and adjacent airspace disease.  CT Chest 01/03/21 Moderate left and small right pleural effusions, which is increased on the left and new on the right in comparison to prior CT.  Adjacent bibasilar and lingular atelectasis.   Increased, moderate-sized pericardial effusion. Biatrial enlargement.   Mild pulmonary edema.  PFT:     No data to display          Labs: 01/21/21: Quantiferon gold indeterminate  Path:  Echo 01/20/21:  1. Left ventricular ejection fraction, by estimation, is 60 to 65%. The  left ventricle has normal function. The left ventricle has no regional  wall motion abnormalities.   2. A small pericardial effusion is present. There is no evidence of  cardiac tamponade.  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  She has stable left pleural effusion, somewhat increased on today's chest x-ray compared to previous but clinically  she is doing well. She is to continue prednisone 5mg  daily and methotrexate weekly.   Follow up in 4 months.  Melody Comas, MD [facility] Office: [phone]   Current Outpatient Medications:    atenolol (TENORMIN) 25 MG tablet, Take half tablet by mouth daily as needed for heart rate >110, Disp: 45 tablet, Rfl: 3   CALCIUM CITRATE-VITAMIN D PO, Take 1 tablet by mouth at bedtime., Disp: , Rfl:    carboxymethylcellulose (REFRESH PLUS) 0.5 % SOLN, Place 1 drop into both eyes 2 (two) times daily., Disp: , Rfl:    ELIQUIS 5 MG TABS tablet, TAKE 1 TABLET BY MOUTH TWICE A DAY, Disp: 60 tablet, Rfl: 6   EPIPEN 2-PAK 0.3 MG/0.3ML SOAJ injection, Inject 0.3 mg into the muscle once as needed for anaphylaxis (severe allergic reaction)., Disp: , Rfl: 0   famotidine (PEPCID) 10 MG tablet, Take 1-2 tablets by mouth twice daily as needed, Disp: 60 tablet, Rfl: 6   folic acid (FOLVITE) 1 MG tablet, TAKE 1  TABLET BY MOUTH EVERY DAY, Disp: 90 tablet, Rfl: 3   furosemide (LASIX) 20 MG tablet, TAKE 1 TABLET BY MOUTH EVERY DAY, Disp: 90 tablet, Rfl: 1   methotrexate 50 MG/2ML injection, Inject 0.5 mLs (12.5 mg total) into the skin once a week., Disp: 4 mL, Rfl: 1   Multiple Vitamin (MULTIVITAMIN WITH MINERALS) TABS tablet, Take 1 tablet by mouth every morning., Disp: , Rfl:    Multiple Vitamins-Minerals (PRESERVISION AREDS PO), Take 1 capsule by mouth 2 (two) times daily., Disp: , Rfl:    potassium chloride (KLOR-CON) 20 MEQ packet, USE 1 PACKET ONCE DAILY, Disp: 90 packet, Rfl: 2   predniSONE (DELTASONE) 5 MG tablet, Take 1 tablet (5 mg total) by mouth daily with breakfast., Disp: 30 tablet, Rfl: 3   TUBERCULIN SYR 1CC/26GX3/8" 26G X 3/8" 1 ML MISC, Use for methotrexate injection once weekly, Disp: 25 each, Rfl: 0

## 2023-02-16 NOTE — Patient Instructions (Signed)
Ok to hold the methotrexate in order to get your vaccines in October  Continue prednisone 5mg  daily  Continue lasix daily, double the dose if you if notice an increase in your shortness of breath, lower extremity edema or a 3-4lb weight gain   Your chest x-ray does appear to have a slight increase in the left pleural effusion  Follow up in 4 months, call sooner if needed

## 2023-02-22 ENCOUNTER — Encounter: Payer: Self-pay | Admitting: Pulmonary Disease

## 2023-02-23 ENCOUNTER — Telehealth: Payer: Self-pay | Admitting: Pulmonary Disease

## 2023-02-23 NOTE — Telephone Encounter (Signed)
Patient calling in for CT test results.

## 2023-02-26 NOTE — Telephone Encounter (Signed)
Patient would like the results of her CT Scan taken in September.

## 2023-02-26 NOTE — Telephone Encounter (Signed)
Called pt no answer, lvmm about results not being back yet from radiology. If pt does call back please inform pt that radiology is behind

## 2023-03-06 ENCOUNTER — Telehealth: Payer: Self-pay | Admitting: Pulmonary Disease

## 2023-03-06 NOTE — Telephone Encounter (Signed)
Pt calling again for Rads taken here results. Please contact PT to advise stats. Dr. Is out and no CB. She is concerned. TY

## 2023-03-06 NOTE — Telephone Encounter (Signed)
CXR is stable - Dr. Francine Graven reviewed prior to signing note and felt the same. Pleural effusion unchanged.

## 2023-03-06 NOTE — Telephone Encounter (Signed)
Called and spoke with Rebecca Short she has no symptoms or concerns. We went over her cxr she verbalized understanding nfn

## 2023-03-06 NOTE — Telephone Encounter (Signed)
F/u   Returning call back to CMA

## 2023-03-06 NOTE — Telephone Encounter (Signed)
Cxr from 02/16/23:  Narrative & Impression  CLINICAL DATA:  Pleural effusion   EXAM: CHEST - 2 VIEW   COMPARISON:  11/04/2022.   FINDINGS: Cardiac silhouette is obscured by adjacent left-sided pleural effusion large left-sided pleural effusion appear stable. Right lung is clear. There is linear subsegmental atelectasis above the left-sided effusion. There is calcification of the aorta. Osseous structures appear grossly intact.   IMPRESSION: Unchanged large left-sided pleural effusion.     Electronically Signed   By: Layla Maw M.D.   On: 03/06/2023 08:28    I called the pt to ask about symptoms. There was no answer- LMTCB.   Will route to DOD (Dr. Judeth Horn)

## 2023-03-06 NOTE — Telephone Encounter (Signed)
Called pt to ensure pt I will reach out to radiology to see if this can be read. Pt verbalized understanding. Will leave encounter open

## 2023-03-26 ENCOUNTER — Ambulatory Visit: Payer: Medicare Other | Admitting: Interventional Cardiology

## 2023-03-30 ENCOUNTER — Other Ambulatory Visit: Payer: Self-pay | Admitting: Interventional Cardiology

## 2023-03-30 ENCOUNTER — Other Ambulatory Visit: Payer: Self-pay | Admitting: Pulmonary Disease

## 2023-03-30 ENCOUNTER — Other Ambulatory Visit: Payer: Self-pay | Admitting: Internal Medicine

## 2023-03-30 DIAGNOSIS — M0579 Rheumatoid arthritis with rheumatoid factor of multiple sites without organ or systems involvement: Secondary | ICD-10-CM

## 2023-03-30 DIAGNOSIS — J90 Pleural effusion, not elsewhere classified: Secondary | ICD-10-CM

## 2023-03-30 MED ORDER — METHOTREXATE SODIUM CHEMO INJECTION 50 MG/2ML
12.5000 mg | INTRAMUSCULAR | 1 refills | Status: DC
Start: 2023-03-30 — End: 2023-05-05

## 2023-03-30 NOTE — Telephone Encounter (Signed)
Last Fill: 02/03/2023  Labs: 02/03/2023 Sedimentation rate remains normal at 6.  Her lymphocyte count is low at 472 but overall white blood cell count is fine I do not believe this requires any medication adjustment.  Kidney and liver function is normal.   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

## 2023-03-31 LAB — HM DEXA SCAN

## 2023-04-21 NOTE — Progress Notes (Addendum)
Office Visit Note  Patient: Rebecca Short             Date of Birth: 08/16/1940           MRN: [id number]             PCP: Daisy Floro, MD Referring: Daisy Floro, MD Visit Date: 05/05/2023   Subjective:  Follow-up (Patient states she had a bone density test a while back and would like Dr. Gregary Cromer opinion on it. )    Discussed the use of AI scribe software for clinical note transcription with the patient, who gave verbal consent to proceed.  History of Present Illness   Rebecca Short is a 82 y.o. female here for follow up for seropositive RA primarily pleural effusion involvement on MTX 12.5 mg [place] weekly and folic acid 1 mg daily and prednisone 5 mg daily.  She presents with concerns about worsening bone density. They report a previous bone density measurement of -4, which improved to -2.9 on most recent DEXA after two years of self-directed exercises. Despite this improvement, the patient acknowledges that their bone density remains in the osteoporosis range. They express frustration with the impact of prednisone on their bone density, describing it as a constant battle.  The patient also reports edema in their ankles, which they monitor daily for changes in size. They have tried compression socks for relief, but found them uncomfortable, particularly around the toes. They express interest in trying open-toe compression socks as a potential solution.  In addition to these concerns, the patient mentions a history of sensitivity to various medications, including Fosamax and methotrexate, which they were reluctant to take. They express a similar hesitance towards starting new treatments for their osteoporosis, but seem open to considering an annual Reclast infusion after discussing it with their physician.  The patient's overall health status appears to be stable, with no reported changes in symptoms or new health issues since their last visit. They deny experiencing any shortness  of breath or worsening of their edema.   Previous HPI 02/03/2023 Rebecca Short is a 82 y.o. female here for follow up for seropositive RA primarily pleural effusion involvement on MTX 12.5 mg [place] weekly and folic acid 1 mg daily and prednisone 5 mg daily.  Since our last visit symptoms are stable and doing pretty well from a functional standpoint.  She is working with physical therapy on her mobility and balance.  Feels her shortness of breath with movement and talking is partially improved.  Last x-ray with Dr. Francine Graven in June with stable left pleural effusion possible improvement in right side.  Still has persistent bruising on extremities no new skin tears.   Previous HPI 11/03/2022 Rebecca Short is a 82 y.o. female here for follow up for seropositive RA on MTX 12.5 mg [place] weekly and folic acid 1 mg daily and prednisone 5 mg daily.  Since our last visit she tapered prednisone down to 5 mg daily which was tolerable but subsequent tapering to 2.5 mg caused worsening of symptoms with shortness of breath, decreased lung volume on at home incentive spirometry, and was found to have right lung effusion at pulmonology clinic evaluation.  She is now back to 5 mg daily.  Also had some adjustment of Lasix dosing for pedal edema with elevated BNP is currently taking 20 mg daily.   Previous HPI 08/04/22 Rebecca Short is a 82 y.o. female here for follow up for seropositive RA association  with serositis and persistent left pleural effusion on methotrexate 15 mg subcu weekly and folic acid 1 mg daily and prednisone 5 mg daily.  Since switching from oral to subcu methotrexate she had a great improvement in medication tolerance no longer feeling nauseated and extremely fatigued for 2 to 3 days after each dose.  Right shoulder pain is doing much better after the steroid injection in January as well.  She continued prednisone tapering with close pulmonology follow-up down to 5 mg daily now and has not noticed any  significant worsening of symptoms.  She is interested in getting off the remaining steroids also interested in getting updated vaccines to feel safer with more social contacts.   Previous HPI 06/04/22 Rebecca Short is a 82 y.o. 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 She had some exacerbation with edema and dyspnea but stable pleural effusion on imaging. Labs with slightly worsening of lymphopenia last month as well.  She is noticing what she thinks is a bit more nausea and fatigue type side effects after the methotrexate weekly dose than before taking up to a few days to fully recover rather than less than 1.  Since tapering the steroid dose down to 7.5 mg about a week ago she is experiencing increased in shoulder pain and stiffness on both sides.     Previous HPI 03/03/22 Rebecca Short is a 82 y.o. 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 So far her respiratory symptoms are doing well since last visit. Her mood and energy level are very poor, and frustrated about inability to socialize and limited exertion tolerance. She reports alternating days of more and less energy, sometimes associated with the methotrexate doses on Mondays but not always. Also having some concentration and memory difficulties increased from before for a few months.   Previous HPI 12/30/2021 Rebecca Short is a 82 y.o. 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 She was hospitalized last month with recurrent pleural effusion 7/4-7/7 requiring repeat thoracocentesis. Since discharge she is back onto higher dose prednisone and symptoms are doing okay. We stopped her azathioprine due to apparent treatment failure and she started methotrexate since 7/31 after negative hepatitis  screening. She has not seen any problems with GI intolerance. She does feel somewhat unsteady or lightheaded and having to move slowly on account of this.   Previous HPI 09/09/2021 LASHANTE FRYBERGER is a 82 y.o. 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 She is doing well since last visit without major events. She has some mild GI irritation with the azathioprine. She has significant bruising on her hands and forearms. She saw Dr. Francine Graven for follow up with stable pulmonary symptoms and repeat chest xray obtained 3/28 was also stable. She is tapering the prednisone of a rate about 5 mg/day dose per month currently down to 20 mg daily. She is curious about safety going to the dentist for procedures on her medications.   Previous HPI 07/09/21 SYDNY SCHNITZLER is a 82 y.o. female here for follow up for rheumatoid pleural effusions after starting azathioprine 50 mg last month, continuing prednisone 40 mg daily and  prophylactic atovaquone. She went for repeat chest xray with Dr. Francine Graven that looked stable 2 weeks ago. So far she denies any major side effects after starting imuran.   Previous HPI 05/08/21 DARCEE DEKKER is a 82 y.o. female here for evaluation with recurrent pleural effusions and dyspnea with positive RF and ANCA Abs. She was hospitalized in August after progressively worsening dyspnea and hypotension with exudative pleural fluid removed from left lung space followed by pericardial drainage and initial treatment with colchicine. She subsequently underwent additional thoracentesis drainage of effusions later in August and beginning of September. Results showed neutrophil predominant fluid. Prednisone 40 mg daily was added to treatment with stable condition. This continued for a month but tapering the prednisone resulted in more rapid re accumulation of fluid on a dose of about 20 gm daily or less. Imaging demonstrated  progressive increase again in left sided effusions and repeat drainage again on 11/14 and 11/30 when down to 10 mg and 5 mg daily doses of prednisone along with colchicine. She is now continuing prednisone 40 mg with atovaquone prophylaxis and colchicine 0.6 mg daily. In addition to pulmonary symptoms she reports bilateral shoulder pain and stiffness developing around the initial onset of her dyspnea before hospitalization in August.  This improved completely on 40 mg prednisone but she noticed definite recurrence of shoulder pain symptoms when tapering down the prednisone to 10 or 5 mg/day dose.  No active complaints today since she is back on higher dose medication.  She denies any peripheral joint pain involvement did not notice any significant swelling or numbness in her upper extremities associated with the pain.   Review of Systems  Constitutional:  Positive for fatigue.  HENT:  Negative for mouth sores and mouth dryness.   Eyes:  Negative for dryness.  Respiratory:  Negative for shortness of breath.   Cardiovascular:  Negative for chest pain and palpitations.  Gastrointestinal:  Negative for blood in stool, constipation and diarrhea.  Endocrine: Negative for increased urination.  Genitourinary:  Positive for involuntary urination.  Musculoskeletal:  Positive for gait problem. Negative for joint pain, joint pain, joint swelling, myalgias, muscle weakness, morning stiffness, muscle tenderness and myalgias.  Skin:  Positive for sensitivity to sunlight. Negative for color change, rash and hair loss.  Allergic/Immunologic: Negative for susceptible to infections.  Neurological:  Negative for dizziness and headaches.  Hematological:  Negative for swollen glands.  Psychiatric/Behavioral:  Negative for depressed mood and sleep disturbance. The patient is not nervous/anxious.     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   Pleural effusion 11/26/2021   Serositis (HCC) 09/27/2021   Fatigue 09/27/2021   Macular pucker, left eye 06/18/2021   High risk medication use 05/30/2021   Bilateral shoulder pain 05/08/2021   Vitamin D deficiency 05/08/2021   Abnormal ANCA test 05/08/2021   RA (rheumatoid arthritis) (HCC) 04/22/2021   S/P thoracentesis    Arterial hypotension    Pleural effusion on left    Pericardial effusion    Leukocytosis 01/03/2021   Community acquired pneumonia 01/03/2021   Atrial fibrillation (HCC) 12/24/2020   Atrial fibrillation with RVR (HCC) 12/23/2020   Musculoskeletal pain 12/23/2020   Posterior vitreous detachment of both eyes 04/30/2020   Macular pucker, right eye 11/08/2019   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HCC) 11/08/2019   Intermediate stage nonexudative age-related macular degeneration of both eyes 11/08/2019  Anticoagulated 11/18/2016   Bradycardia 03/23/2014   OSA (obstructive sleep apnea) 01/19/2014   Obesity (BMI 30-39.9) 01/19/2014   Paroxysmal A-fib (HCC) 10/04/2013   Nonspecific abnormal results of cardiovascular function study 07/21/2013   Acute gastroenteritis 07/15/2013    Past Medical History:  Diagnosis Date   A-fib [facility])    Allergy    Atrial fibrillation (HCC)    FH: cholecystectomy 1997   Macular degeneration    OSA (obstructive sleep apnea)    mild with total AHI 7.25/hr and severe during REM sleep at 35/hr now on CPAP at 6cm H2O    Family History  Problem Relation Age of Onset   Osteoporosis Mother    Macular degeneration Mother    Heart Problems Father    Stroke Father    Diabetes Brother    Kidney failure Brother    Cancer Maternal Grandmother    Dementia Paternal Grandmother    Suicidality Other    Heart attack Neg Hx    Past Surgical History:  Procedure Laterality Date   CHEST TUBE INSERTION Left 11/27/2021   Procedure: CHEST TUBE INSERTION;  Surgeon: Martina Sinner, MD;  Location: [facility] ENDOSCOPY;  Service: Pulmonary;  Laterality: Left;  Prefer AM slot, thanks   CHOLECYSTECTOMY     PERICARDIOCENTESIS N/A 01/07/2021   Procedure: PERICARDIOCENTESIS;  Surgeon: Yvonne Kendall, MD;  Location: [facility] INVASIVE CV LAB;  Service: Cardiovascular;  Laterality: N/A;   REPLACEMENT TOTAL KNEE BILATERAL     THORACENTESIS N/A 01/25/2021   Procedure: THORACENTESIS;  Surgeon: Martina Sinner, MD;  Location: [facility] ENDOSCOPY;  Service: Pulmonary;  Laterality: N/A;   THORACENTESIS N/A 04/08/2021   Procedure: THORACENTESIS;  Surgeon: Martina Sinner, MD;  Location: [facility] ENDOSCOPY;  Service: Pulmonary;  Laterality: N/A;   THORACENTESIS N/A 04/24/2021   Procedure: THORACENTESIS;  Surgeon: Lupita Leash, MD;  Location: [facility] ENDOSCOPY;  Service: Cardiopulmonary;  Laterality: N/A;   Social History   Social History Narrative   ** Merged History Encounter **       Immunization History  Administered Date(s) Administered   Influenza, High Dose Seasonal PF 07/05/2014, 03/03/2015, 06/29/2015, 07/09/2016, 03/03/2017, 07/24/2017, 02/05/2018, 06/23/2018, 06/08/2019, 02/18/2020, 06/08/2020   Influenza, Quadrivalent, Recombinant, Inj, Pf 03/04/2019   Influenza-Unspecified 01/24/2014, 02/23/2023   PFIZER Comirnaty(Gray Top)Covid-19 Tri-Sucrose Vaccine 02/25/2023   PFIZER(Purple Top)SARS-COV-2 Vaccination 06/16/2019, 07/07/2019, 02/18/2020, 06/08/2020   PNEUMOCOCCAL CONJUGATE-20 08/05/2022   Pneumococcal Polysaccharide-23 03/22/2015, 06/29/2015, 07/09/2016, 07/24/2017, 06/23/2018, 06/08/2019, 06/08/2020, 06/03/2021   RSV,unspecified 02/27/2023   Zoster Recombinant(Shingrix) 03/09/2017     Objective: Vital Signs: BP 126/81 (BP Location: Left Arm, Patient Position: Sitting, Cuff Size: Normal)   Pulse 98   Resp 14   Ht 5\' 2"  (1.575 m)   Wt 158 lb (71.7 kg)   BMI 28.90 kg/m    Physical Exam Eyes:     Conjunctiva/sclera: Conjunctivae normal.  Cardiovascular:     Rate and  Rhythm: Normal rate and regular rhythm.  Pulmonary:     Effort: Pulmonary effort is normal.     Comments: Decreased left lower lung field breath sounds Lymphadenopathy:     Cervical: No cervical adenopathy.  Skin:    General: Skin is warm and dry.     Findings: Bruising present.     Comments: Extensive bruising on back of hands and forearms 2+ pitting edema 1/4 way up from ankles  Neurological:     Mental Status: She is alert.  Psychiatric:        Mood and Affect: Mood normal.  Musculoskeletal Exam:  Shoulders full ROM no tenderness or swelling Elbows full ROM no tenderness or swelling Wrists full ROM no tenderness or swelling Hands sensitive to pressure b/l, no palpable swelling Knees full ROM no tenderness or swelling   Investigation: No additional findings.  Imaging: No results found.  Recent Labs: Lab Results  Component Value Date   WBC 8.0 02/03/2023   HGB 15.3 02/03/2023   PLT 224 02/03/2023   NA 135 02/03/2023   K 4.8 02/03/2023   CL 97 (L) 02/03/2023   CO2 30 02/03/2023   GLUCOSE 91 02/03/2023   BUN 13 02/03/2023   CREATININE 0.62 02/03/2023   BILITOT 0.7 02/03/2023   ALKPHOS 51 05/01/2022   AST 13 02/03/2023   ALT 16 02/03/2023   PROT 6.1 02/03/2023   ALBUMIN 3.7 05/01/2022   CALCIUM 9.5 02/03/2023   GFRAA 90 12/27/2018   QFTBGOLDPLUS Negative 11/28/2021    Speciality Comments: No specialty comments available.  Procedures:  No procedures performed Allergies: Erythromycin, Azathioprine, Sulfa antibiotics, Azithromycin, Condrolite [glucosamine-chondroitin-msm], Glucosamine, Glucosamine sulfate-msm, and Sulfamethoxazole-trimethoprim   Assessment / Plan:     Visit Diagnoses: Rheumatoid arthritis involving multiple sites with positive rheumatoid factor (HCC) - Plan: Sedimentation rate Disease activity appears stable has persistent pleural effusion but no progressive dyspnea symptoms or exacerbation. She has continued follow-up in pulmonology  with interval exam and chest x-ray. -Checking sed rate for disease activity monitoring.  -Continue methotrexate 12.5 mg subcu weekly and folic acid 1 mg daily -Continue prednisone 5 mg daily  High risk medication use - methotrexate 12.5 mg subcu weekly folic acid 1 mg daily - Plan: CBC with Differential/Platelet, COMPLETE METABOLIC PANEL WITH GFR No serious interval infection. -Checking CBC and CMP for medication monitoring on long-term continued use of methotrexate  Long term (current) use of systemic steroids - prednisone 5 mg daily Vitamin D deficiency - Plan: VITAMIN D 25 Hydroxy (Vit-D Deficiency, Fractures) Osteoporosis Worsening bone density despite exercise regimen. Currently on long-term prednisone which contributes to bone density loss. Discussed the importance of antiresorptive medicines in the setting of long-term prednisone use. High risk of fracture over the next 10 years (25%). -Consider Reclast (zoledronic acid) infusion once a year to improve bone density and counteract the effects of long-term steroid use. -Recheck calcium and vitamin D levels to ensure optimal levels for bone health.  Peripheral Edema Noted in the hands. Patient reports discomfort with compression socks. -Consider trying open-toe compression socks to alleviate discomfort and manage edema.  Orders: Orders Placed This Encounter  Procedures   Sedimentation rate   CBC with Differential/Platelet   COMPLETE METABOLIC PANEL WITH GFR   VITAMIN D 25 Hydroxy (Vit-D Deficiency, Fractures)   No orders of the defined types were placed in this encounter.    Follow-Up Instructions: Return in about 3 months (around 08/03/2023) for RA/Effusion/OP on MTX/GC/Reclast start f/u 3mos.   Fuller Plan, MD  Note - This record has been created using [employer].  Chart creation errors have been sought, but may not always  have been located. Such creation errors do not reflect on  the standard of medical  care.

## 2023-04-30 ENCOUNTER — Other Ambulatory Visit: Payer: Self-pay | Admitting: Internal Medicine

## 2023-04-30 DIAGNOSIS — M0579 Rheumatoid arthritis with rheumatoid factor of multiple sites without organ or systems involvement: Secondary | ICD-10-CM

## 2023-05-04 ENCOUNTER — Other Ambulatory Visit: Payer: Self-pay | Admitting: Internal Medicine

## 2023-05-04 DIAGNOSIS — M0579 Rheumatoid arthritis with rheumatoid factor of multiple sites without organ or systems involvement: Secondary | ICD-10-CM

## 2023-05-05 ENCOUNTER — Telehealth: Payer: Self-pay | Admitting: Pharmacist

## 2023-05-05 ENCOUNTER — Encounter: Payer: Self-pay | Admitting: Internal Medicine

## 2023-05-05 ENCOUNTER — Ambulatory Visit: Payer: Medicare Other | Attending: Internal Medicine | Admitting: Internal Medicine

## 2023-05-05 VITALS — BP 126/81 | HR 98 | Resp 14 | Ht 62.0 in | Wt 158.0 lb

## 2023-05-05 DIAGNOSIS — J90 Pleural effusion, not elsewhere classified: Secondary | ICD-10-CM

## 2023-05-05 DIAGNOSIS — Z79899 Other long term (current) drug therapy: Secondary | ICD-10-CM | POA: Diagnosis present

## 2023-05-05 DIAGNOSIS — Z7952 Long term (current) use of systemic steroids: Secondary | ICD-10-CM | POA: Diagnosis present

## 2023-05-05 DIAGNOSIS — M0579 Rheumatoid arthritis with rheumatoid factor of multiple sites without organ or systems involvement: Secondary | ICD-10-CM | POA: Diagnosis present

## 2023-05-05 DIAGNOSIS — E559 Vitamin D deficiency, unspecified: Secondary | ICD-10-CM | POA: Diagnosis present

## 2023-05-05 MED ORDER — METHOTREXATE SODIUM CHEMO INJECTION 50 MG/2ML: 12.5000 mg | INTRAMUSCULAR | 2 refills | Status: DC

## 2023-05-05 MED ORDER — TUBERCULIN SYRINGE 26G X 3/8" 1 ML MISC: 0 refills | Status: DC

## 2023-05-05 NOTE — Progress Notes (Signed)
Pharmacy Note  Subjective: Patient presents today to the [facility] Rhe[facility]ology for follow up office visit.   Patient seen by pharmacist for counseling on Reclast therapy for osteoporosis.   Objective:  Lab Results  Component Value Date   VD25OH 63 05/08/2021   CMP     Component Value Date/Time   NA 135 02/03/2023 1112   NA 139 02/06/2021 1557   K 4.8 02/03/2023 1112   CL 97 (L) 02/03/2023 1112   CO2 30 02/03/2023 1112   GLUCOSE 91 02/03/2023 1112   BUN 13 02/03/2023 1112   BUN 27 02/06/2021 1557   CREATININE 0.62 02/03/2023 1112   CALCIUM 9.5 02/03/2023 1112   PROT 6.1 02/03/2023 1112   PROT 6.1 08/19/2017 0816   ALBUMIN 3.7 05/01/2022 0933   ALBUMIN 3.8 08/19/2017 0816   AST 13 02/03/2023 1112   ALT 16 02/03/2023 1112   ALKPHOS 51 05/01/2022 0933   BILITOT 0.7 02/03/2023 1112   BILITOT 0.6 08/19/2017 0816   GFRNONAA >60 11/29/2021 0103   GFRAA 90 12/27/2018 0831   Osteoporosis risk factors include: history of significant steroid use, post-menopausal, female  Assessment and Plan: Patient unable to take oral bisphosphonates due to baseline GI issues like gastroenteritis  Counseled patient that Reclast is an IV bisphosphonate that reduces bone turnover by inhibiting osteoclasts that chew up bone.  Counseled patient on purpose, proper use, and adverse effects of Reclast.  Reviewed adverse events of Reclast including risk of nausea & diarrhea, headache, and muscle & bone pain.  Reviewed rare adverse effect of osteonecrosis of the jaw and advised patient to alert her dentist that she is on Reclast prior to any major dental work.  Patient confirms she does not have any major dental work scheduled at this time.    Reviewed importance of taking calcium and vitamin D with bisphosphonate therapy. Recommended daily amount of calcium is 1200mg  and vitamin D [phone] units.  Advised calcium is better obtained through diet vs supplement.  Counseled about risk of excess calcium  supplementation such a kidney stones and increased risk of heart disease.    Provided patient with medication education material and answered all questions. Patient agrees to trial of Reclast IV infusion 5 mg yearly at this time.  Reclast is covered 80% by Medicare Part B and the remaining 20% can be covered by a supplemental plan.  Unable to provide exact co-pay amount as it is billed through medical not pharmacy benefit.  Patient verbalized understanding.   Referral will be placed to  [facility] @ [address]  Chesley Mires, PharmD, MPH, BCPS, CPP Clinical Pharmacist (Rheumatology and Pulmonology)

## 2023-05-05 NOTE — Patient Instructions (Addendum)
Referral for Reclast infusion will be placed to [facility]  Please ensure you drink a few cups of water before infusion  Zoledronic Acid Injection (Bone Disorders) What is this medication? ZOLEDRONIC ACID (ZOE le dron ik AS id) prevents and treats osteoporosis. It may also be used to treat Paget's disease of the bone. It works by Interior and spatial designer stronger and less likely to break (fracture). It belongs to a group of medications called bisphosphonates. This medicine may be used for other purposes; ask your health care provider or pharmacist if you have questions. COMMON BRAND NAME(S): Reclast What should I tell my care team before I take this medication? They need to know if you have any of these conditions: Bleeding disorder Cancer Dental disease Kidney disease Low levels of calcium in the blood Low red blood cell counts Lung or breathing disease, such as asthma Receiving steroids, such as dexamethasone or prednisone An unusual or allergic reaction to zoledronic acid, other medications, foods, dyes, or preservatives Pregnant or trying to get pregnant Breast-feeding How should I use this medication? This medication is injected into a vein. It is given by your care team in a hospital or clinic setting. A special MedGuide will be given to you before each treatment. Be sure to read this information carefully each time. Talk to your care team about the use of this medication in children. Special care may be needed. Overdosage: If you think you have taken too much of this medicine contact a poison control center or emergency room at once. NOTE: This medicine is only for you. Do not share this medicine with others. What if I miss a dose? Keep appointments for follow-up doses. It is important not to miss your dose. Call your care team if you are unable to keep an appointment. What may interact with this medication? Certain antibiotics given by injection Medications for pain  and inflammation, such as ibuprofen, naproxen, NSAIDs Some diuretics, such as bumetanide, furosemide Teriparatide This list may not describe all possible interactions. Give your health care provider a list of all the medicines, herbs, non-prescription drugs, or dietary supplements you use. Also tell them if you smoke, drink alcohol, or use illegal drugs. Some items may interact with your medicine. What should I watch for while using this medication? Visit your care team for regular checks on your progress. It may be some time before you see the benefit from this medication. Some people who take this medication have severe bone, joint, or muscle pain. This medication may also increase your risk for jaw problems or a broken thigh bone. Tell your care team right away if you have severe pain in your jaw, bones, joints, or muscles. Tell your care team if you have any pain that does not go away or that gets worse. You should make sure you get enough calcium and vitamin D while you are taking this medication. Discuss the foods you eat and the vitamins you take with your care team. You may need bloodwork while taking this medication. Tell your dentist and dental surgeon that you are taking this medication. You should not have major dental surgery while on this medication. See your dentist to have a dental exam and fix any dental problems before starting this medication. Take good care of your teeth while on this medication. Make sure you see your dentist for regular follow-up appointments. What side effects may I notice from receiving this medication? Side effects that you should report to your care  team as soon as possible: Allergic reactions--skin rash, itching, hives, swelling of the face, lips, tongue, or throat Kidney injury--decrease in the amount of urine, swelling of the ankles, hands, or feet Low calcium level--muscle pain or cramps, confusion, tingling, or numbness in the hands or feet Osteonecrosis  of the jaw--pain, swelling, or redness in the mouth, numbness of the jaw, poor healing after dental work, unusual discharge from the mouth, visible bones in the mouth Severe bone, joint, or muscle pain Side effects that usually do not require medical attention (report to your care team if they continue or are bothersome): Diarrhea Dizziness Headache Nausea Stomach pain Vomiting This list may not describe all possible side effects. Call your doctor for medical advice about side effects. You may report side effects to FDA at 1-800-FDA-1088. Where should I keep my medication? This medication is given in a hospital or clinic. It will not be stored at home. NOTE: This sheet is a summary. It may not cover all possible information. If you have questions about this medicine, talk to your doctor, pharmacist, or health care provider.  2024 Elsevier/Gold Standard (2021-06-28 00:00:00)

## 2023-05-05 NOTE — Telephone Encounter (Addendum)
Pending baseline labs from today's OV patient will be new start to Reclast  Referral will be placed to  [facility] @ [address]  Chesley Mires, PharmD, MPH, BCPS, CPP Clinical Pharmacist (Rheumatology and Pulmonology)

## 2023-05-06 LAB — CBC WITH DIFFERENTIAL/PLATELET
Absolute Lymphocytes: 462 {cells}/uL — ABNORMAL LOW (ref 850–3900)
Absolute Monocytes: 531 {cells}/uL (ref 200–950)
Basophils Absolute: 39 {cells}/uL (ref 0–200)
Basophils Relative: 0.5 %
Eosinophils Absolute: 23 {cells}/uL (ref 15–500)
Eosinophils Relative: 0.3 %
HCT: 46.3 % — ABNORMAL HIGH (ref 35.0–45.0)
Hemoglobin: 15.4 g/dL (ref 11.7–15.5)
MCH: 30.7 pg (ref 27.0–33.0)
MCHC: 33.3 g/dL (ref 32.0–36.0)
MCV: 92.2 fL (ref 80.0–100.0)
MPV: 11.2 fL (ref 7.5–12.5)
Monocytes Relative: 6.9 %
Neutro Abs: 6645 {cells}/uL (ref 1500–7800)
Neutrophils Relative %: 86.3 %
Platelets: 233 10*3/uL (ref 140–400)
RBC: 5.02 10*6/uL (ref 3.80–5.10)
RDW: 14.5 % (ref 11.0–15.0)
Total Lymphocyte: 6 %
WBC: 7.7 10*3/uL (ref 3.8–10.8)

## 2023-05-06 LAB — COMPLETE METABOLIC PANEL WITH GFR
AG Ratio: 1.6 (calc) (ref 1.0–2.5)
ALT: 20 U/L (ref 6–29)
AST: 19 U/L (ref 10–35)
Albumin: 3.7 g/dL (ref 3.6–5.1)
Alkaline phosphatase (APISO): 86 U/L (ref 37–153)
BUN: 12 mg/dL (ref 7–25)
CO2: 31 mmol/L (ref 20–32)
Calcium: 9.4 mg/dL (ref 8.6–10.4)
Chloride: 98 mmol/L (ref 98–110)
Creat: 0.64 mg/dL (ref 0.60–0.95)
Globulin: 2.3 g/dL (ref 1.9–3.7)
Glucose, Bld: 93 mg/dL (ref 65–99)
Potassium: 4.5 mmol/L (ref 3.5–5.3)
Sodium: 136 mmol/L (ref 135–146)
Total Bilirubin: 0.8 mg/dL (ref 0.2–1.2)
Total Protein: 6 g/dL — ABNORMAL LOW (ref 6.1–8.1)
eGFR: 88 mL/min/{1.73_m2} (ref >=60)

## 2023-05-06 LAB — VITAMIN D 25 HYDROXY (VIT D DEFICIENCY, FRACTURES): Vit D, 25-Hydroxy: 47 ng/mL (ref 30–100)

## 2023-05-06 LAB — SEDIMENTATION RATE: Sed Rate: 11 mm/h (ref 0–30)

## 2023-05-08 ENCOUNTER — Other Ambulatory Visit: Payer: Self-pay | Admitting: Pharmacist

## 2023-05-08 ENCOUNTER — Telehealth: Payer: Self-pay

## 2023-05-08 DIAGNOSIS — M810 Age-related osteoporosis without current pathological fracture: Secondary | ICD-10-CM

## 2023-05-08 NOTE — Progress Notes (Signed)
Therapy plan placed for Reclast IV [id number]) for [facility] to start benefits investigation  Diagnosis: osteoporosis  Provider: Dr. Sheliah Hatch  Dose: 5mg  every 12 months  Last Clinic Visit: 05/05/2023 Next Clinic Visit: 08/11/2023  Pertinent Labs: CMP on 05/05/2023 wnl, Vitamin D on 05/05/2023 wnl   Chesley Mires, PharmD, MPH, BCPS, CPP Clinical Pharmacist (Rheumatology and Pulmonology)

## 2023-05-08 NOTE — Telephone Encounter (Signed)
Dr. Dimple Casey and [facility], patient will be scheduled as soon as possible.  Auth Submission: NO AUTH NEEDED Site of care: Site of care: [facility] Payer: Medicare A/B with AARP supplement Medication & CPT/J Code(s) submitted: Reclast (Zolendronic acid) [id number] Route of submission (phone, fax, portal):  Phone # Fax # Auth type: Buy/Bill PB Units/visits requested: 5mg  x 1 dose Reference number:  Approval from: 05/08/23 to 07/24/23   Medicare will cover 80%, AARP supplement will cover the remaining 20%.

## 2023-05-08 NOTE — Progress Notes (Signed)
Vitamin D is good at 69.  ESR of 11 is normal.  Her total white blood count is normal with slightly low lymphocytes which is probably from medications not low enough to be a major risk.  No problem for continuing current medications and to start the Reclast infusion as planned.

## 2023-05-14 NOTE — Progress Notes (Signed)
Reclast scheduled for 05/22/23

## 2023-05-22 ENCOUNTER — Ambulatory Visit (INDEPENDENT_AMBULATORY_CARE_PROVIDER_SITE_OTHER): Payer: Medicare Other

## 2023-05-22 VITALS — BP 138/81 | HR 70 | Temp 98.0°F | Resp 18 | Ht 61.0 in | Wt 159.6 lb

## 2023-05-22 DIAGNOSIS — M810 Age-related osteoporosis without current pathological fracture: Secondary | ICD-10-CM

## 2023-05-22 MED ORDER — DIPHENHYDRAMINE HCL 25 MG PO CAPS
25.0000 mg | ORAL_CAPSULE | Freq: Once | ORAL | Status: AC
  Administered 2023-05-22: 25 mg via ORAL
  Filled 2023-05-22: qty 1

## 2023-05-22 MED ORDER — ZOLEDRONIC ACID 5 MG/100ML IV SOLN
5.0000 mg | Freq: Once | INTRAVENOUS | Status: AC
  Administered 2023-05-22: 5 mg via INTRAVENOUS
  Filled 2023-05-22: qty 100

## 2023-05-22 MED ORDER — ACETAMINOPHEN 325 MG PO TABS
650.0000 mg | ORAL_TABLET | Freq: Once | ORAL | Status: AC
  Administered 2023-05-22: 650 mg via ORAL
  Filled 2023-05-22: qty 2

## 2023-05-22 MED ORDER — SODIUM CHLORIDE 0.9 % IV SOLN
INTRAVENOUS | Status: DC
Start: 2023-05-22 — End: 2023-05-22

## 2023-05-22 NOTE — Patient Instructions (Signed)

## 2023-05-22 NOTE — Progress Notes (Signed)
Diagnosis: Osteoporosis  Provider:  Chilton Greathouse MD  Procedure: IV Infusion  IV Type: Peripheral, IV Location: R Antecubital  Reclast (Zolendronic Acid), Dose: 5 mg  Infusion Start Time: 1124  Infusion Stop Time: 1155  Post Infusion IV Care: Observation period completed and Peripheral IV Discontinued  Discharge: Condition: Good, Destination: Home . AVS Provided  Performed by:  Adriana Mccallum, RN

## 2023-05-30 ENCOUNTER — Other Ambulatory Visit: Payer: Self-pay | Admitting: Internal Medicine

## 2023-05-30 DIAGNOSIS — M0579 Rheumatoid arthritis with rheumatoid factor of multiple sites without organ or systems involvement: Secondary | ICD-10-CM

## 2023-06-01 ENCOUNTER — Ambulatory Visit: Payer: Medicare Other | Attending: Cardiology | Admitting: Cardiology

## 2023-06-01 VITALS — Ht 62.0 in | Wt 146.6 lb

## 2023-06-01 DIAGNOSIS — I700 Atherosclerosis of aorta: Secondary | ICD-10-CM | POA: Diagnosis present

## 2023-06-01 DIAGNOSIS — I4821 Permanent atrial fibrillation: Secondary | ICD-10-CM | POA: Insufficient documentation

## 2023-06-01 DIAGNOSIS — G4733 Obstructive sleep apnea (adult) (pediatric): Secondary | ICD-10-CM | POA: Insufficient documentation

## 2023-06-01 DIAGNOSIS — E7800 Pure hypercholesterolemia, unspecified: Secondary | ICD-10-CM | POA: Diagnosis present

## 2023-06-01 DIAGNOSIS — D6869 Other thrombophilia: Secondary | ICD-10-CM | POA: Diagnosis present

## 2023-06-01 DIAGNOSIS — J90 Pleural effusion, not elsewhere classified: Secondary | ICD-10-CM | POA: Diagnosis not present

## 2023-06-01 NOTE — Patient Instructions (Signed)
 Medication Instructions:  Your physician recommends that you continue on your current medications as directed. Please refer to the Current Medication list given to you today.  *If you need a refill on your cardiac medications before your next appointment, please call your pharmacy*   Lab Work: None.  If you have labs (blood work) drawn today and your tests are completely normal, you will receive your results only by: MyChart Message (if you have MyChart) OR A paper copy in the mail If you have any lab test that is abnormal or we need to change your treatment, we will call you to review the results.   Testing/Procedures: None.   Follow-Up: At [facility], you and your health needs are our priority.  As part of our continuing mission to provide you with exceptional heart care, we have created designated Provider Care Teams.  These Care Teams include your primary Cardiologist (physician) and Advanced Practice Providers (APPs -  Physician Assistants and Nurse Practitioners) who all work together to provide you with the care you need, when you need it.  We recommend signing up for the patient portal called "MyChart".  Sign up information is provided on this After Visit Summary.  MyChart is used to connect with patients for Virtual Visits (Telemedicine).  Patients are able to view lab/test results, encounter notes, upcoming appointments, etc.  Non-urgent messages can be sent to your provider as well.   To learn more about what you can do with MyChart, go to [email or web address].    Your next appointment:   1 year(s)  Provider:   Dr. Armanda Magic, MD

## 2023-06-01 NOTE — Progress Notes (Signed)
 Cardiology Office Note:   Date:  06/01/2023  ID:  Rebecca, Short 07-25-1940, MRN [id number] PCP:  Okey Carlin Redbird, MD  [facility] Providers Cardiologist:  Wendel Haws, MD Referring MD: Okey Carlin Redbird, MD  Chief Complaint/Reason for Referral: Cardiology follow-up/atrial fibrillation ASSESSMENT:    1. Paroxysmal A-fib (HCC)   2. Secondary hypercoagulable state (HCC)   3. Hyperlipidemia LDL goal <70   4. Aortic atherosclerosis (HCC)   5. CKD (chronic kidney disease) stage 2, GFR 60-89 ml/min   6. BMI 26.0-26.9,adult   7. Rheumatoid arthritis with positive rheumatoid factor, involving unspecified site (HCC)   8. OSA (obstructive sleep apnea)     PLAN:   In order of problems listed above: Atrial fibrillation: Continue Eliquis .  Consider scheduled atenolol ? Secondary hypercoagulable state: Continue Eliquis . Hyperlipidemia: Check lipid panel today: I do not think intensive lipid lowering is required in this patient of advanced age.  Will defer LP(a) testing. Aortic atherosclerosis: Continue Eliquis  and keep LDL relatively well-controlled with diet or low-dose statin if needed. CKD stage II: Monitor for now.  Could consider ARB for renal protection. Elevated BMI: Diet and exercise. Rheumatoid arthritis: On methotrexate  per other providers. OSA: Appreciate Dr. Shlomo recommendations.         Dispo:  No follow-ups on file.      Medication Adjustments/Labs and Tests Ordered: Current medicines are reviewed at length with the patient today.  Concerns regarding medicines are outlined above.  The following changes have been made:     Labs/tests ordered: No orders of the defined types were placed in this encounter.   Medication Changes: No orders of the defined types were placed in this encounter.   Current medicines are reviewed at length with the patient today.  The patient  concerns regarding medicines.  I spent  minutes reviewing all clinical data during and  prior to this visit including all relevant imaging studies, laboratories, clinical information from other health systems and prior notes from both Cardiology and other specialties, interviewing the patient, conducting a complete physical examination, and coordinating care in order to formulate a comprehensive and personalized evaluation and treatment plan.   History of Present Illness:      FOCUSED PROBLEM LIST:   PAF On Eliquis  CV 2 score 4 Aortic atherosclerosis Chest CT 2023 BMI 26 Obstructive sleep apnea On CPAP CKD stage II Rheumatoid arthritis On methotrexate   1/25: The patient is here for routine follow-up.  The patient was seen around 6 months ago and was doing well.  She was tolerating anticoagulation well.  She was taking atenolol  as needed.  She denies any symptomatic palpitations.  A lipid panel in April 2024 demonstrated an LDL of 68.          Current Medications: No outpatient medications have been marked as taking for the 06/04/23 encounter (Office Visit) with Jagger Beahm K, MD.     Review of Systems:   Please see the history of present illness.    All other systems reviewed and are negative.     EKGs/Labs/Other Test Reviewed:   EKG: EKG performed January 2025 demonstrates atrial fibrillation with a rapid ventricular rate  EKG Interpretation Date/Time:    Ventricular Rate:    PR Interval:    QRS Duration:    QT Interval:    QTC Calculation:   R Axis:      Text Interpretation:           Risk Assessment/Calculations:    CHA2DS2-VASc Score = 4  This indicates a 4.8% annual risk of stroke. The patient's score is based upon: CHF History: 0 HTN History: 1 Diabetes History: 0 Stroke History: 0 Vascular Disease History: 0 Age Score: 2 Gender Score: 1         Physical Exam:   VS:  There were no vitals taken for this visit.      Wt Readings from Last 3 Encounters:  06/01/23 146 lb 9.6 oz (66.5 kg)  05/22/23 159 lb 9.6 oz (72.4 kg)   05/05/23 158 lb (71.7 kg)      GENERAL:  No apparent distress, AOx3 HEENT:  No carotid bruits, +2 carotid impulses, no scleral icterus CAR: RRR Irregular RR no murmurs, gallops, rubs, or thrills RES:  Clear to auscultation bilaterally ABD:  Soft, nontender, nondistended, positive bowel sounds x 4 VASC:  +2 radial pulses, +2 carotid pulses NEURO:  CN 2-12 grossly intact; motor and sensory grossly intact PSYCH:  No active depression or anxiety EXT:  No edema, ecchymosis, or cyanosis  Signed, Tres Grzywacz K Kollins Fenter, MD  06/01/2023 2:15 PM    [facility] [address] [place], [place], [place]  [address] Phone: [phone]; Fax: [phone]   Note:  This document was prepared using Dragon voice recognition software and may include unintentional dictation errors.

## 2023-06-01 NOTE — Progress Notes (Signed)
 Date:  06/01/2023   ID:  Rebecca Short, DOB [birth year], MRN [id number]  PCP:  Rebecca Carlin Redbird, MD Cardiologist: Rebecca Reek, MD  Sleep Medicine:  Rebecca Bihari, MD Electrophysiologist:  None   Chief Complaint:  OSA  History of Present Illness:    Rebecca Short is a 83 y.o. 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   She is doing well with her PAP device and thinks that she has gotten used to it.  She tolerates the mask and feels the pressure is adequate.  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  She denies any significant mouth or nasal dryness or nasal congestion.  She does not think that he snores.    Prior CV studies:   The following studies were reviewed today:  PAP compliance download  Past Medical History:  Diagnosis Date   A-fib (HCC)    Allergy     Atrial fibrillation (HCC)    FH: cholecystectomy 1997   Macular degeneration    OSA (obstructive sleep apnea)    mild with total AHI 7.25/hr and severe during REM sleep at 35/hr now on CPAP at 6cm H2O   Past Surgical History:  Procedure Laterality Date   CHEST TUBE INSERTION Left 11/27/2021   Procedure: CHEST TUBE INSERTION;  Surgeon: Rebecca Dorn NOVAK, MD;  Location: [facility] ENDOSCOPY;  Service: Pulmonary;  Laterality: Left;  Prefer AM slot, thanks   CHOLECYSTECTOMY     PERICARDIOCENTESIS N/A 01/07/2021   Procedure: PERICARDIOCENTESIS;  Surgeon: Rebecca Bruckner, MD;  Location: [facility] INVASIVE CV LAB;  Service: Cardiovascular;  Laterality: N/A;   REPLACEMENT TOTAL KNEE BILATERAL     THORACENTESIS N/A 01/25/2021   Procedure: THORACENTESIS;  Surgeon: Rebecca Dorn NOVAK, MD;  Location: [facility] ENDOSCOPY;  Service: Pulmonary;  Laterality: N/A;   THORACENTESIS N/A 04/08/2021   Procedure: THORACENTESIS;  Surgeon: Rebecca Dorn NOVAK, MD;   Location: [facility] ENDOSCOPY;  Service: Pulmonary;  Laterality: N/A;   THORACENTESIS N/A 04/24/2021   Procedure: THORACENTESIS;  Surgeon: Alaine Vicenta NOVAK, MD;  Location: [facility] ENDOSCOPY;  Service: Cardiopulmonary;  Laterality: N/A;     Current Meds  Medication Sig   atenolol  (TENORMIN ) 25 MG tablet Take half tablet by mouth daily as needed for heart rate >110   CALCIUM  CITRATE-VITAMIN D  PO Take 1 tablet by mouth at bedtime.   carboxymethylcellulose (REFRESH PLUS) 0.5 % SOLN Place 1 drop into both eyes 2 (two) times daily.   ELIQUIS  5 MG TABS tablet TAKE 1 TABLET BY MOUTH TWICE A DAY   EPIPEN 2-PAK 0.3 MG/0.3ML SOAJ injection Inject 0.3 mg into the muscle once as needed for anaphylaxis (severe allergic reaction).   famotidine  (PEPCID ) 10 MG tablet Take 1-2 tablets by mouth twice daily as needed   folic acid  (FOLVITE ) 1 MG tablet TAKE 1 TABLET BY MOUTH EVERY DAY   furosemide  (LASIX ) 20 MG tablet TAKE 1 TABLET BY MOUTH EVERY DAY   methotrexate  50 MG/2ML injection Inject 0.5 mLs (12.5 mg total) into the skin once a week.   Multiple Vitamin (MULTIVITAMIN WITH MINERALS) TABS tablet Take 1 tablet by mouth every morning.   Multiple Vitamins-Minerals (PRESERVISION AREDS PO) Take 1 capsule by mouth 2 (two) times daily.   potassium chloride  (KLOR-CON ) 20 MEQ packet  USE 1 PACKET ONCE DAILY   predniSONE  (DELTASONE ) 5 MG tablet TAKE 1 TABLET BY MOUTH EVERY DAY WITH BREAKFAST   TUBERCULIN SYR 1CC/26GX3/8 26G X 3/8 1 ML MISC Use for methotrexate  injection once weekly   zoledronic  acid (RECLAST ) 5 MG/100ML SOLN injection Inject 5 mg into the vein once.     Allergies:   Erythromycin, Azathioprine , Sulfa  antibiotics, Azithromycin , Condrolite [glucosamine-chondroitin-msm], Glucosamine, Glucosamine sulfate-msm, and Sulfamethoxazole -trimethoprim    Social History   Tobacco Use   Smoking status: Never    Passive exposure: Past   Smokeless tobacco: Never  Vaping Use   Vaping status: Never Used  Substance Use  Topics   Alcohol  use: Never   Drug use: Never     Family Hx: The patient's family history includes Cancer in her maternal grandmother; Dementia in her paternal grandmother; Diabetes in her brother; Heart Problems in her father; Kidney failure in her brother; Macular degeneration in her mother; Osteoporosis in her mother; Stroke in her father; Suicidality in an other family member. There is no history of Heart attack.  ROS:   Please see the history of present illness.     All other systems reviewed and are negative.   Labs/Other Tests and Data Reviewed:   EKG Interpretation Date/Time:  Monday June 01 2023 11:40:20 EST Ventricular Rate:  105 PR Interval:    QRS Duration:  74 QT Interval:  320 QTC Calculation: 422 R Axis:   61  Text Interpretation: Atrial fibrillation with rapid ventricular response Low voltage QRS Septal infarct (cited on or before 26-Nov-2021) When compared with ECG of 26-Nov-2021 17:53, Questionable change in initial forces of Anterior leads Confirmed by Rebecca Short ([id number]) on 06/01/2023 11:41:57 AM Rebecca Short    Recent Labs: 09/22/2022: Pro B Natriuretic peptide (BNP) 265.0 05/05/2023: ALT 20; BUN 12; Creat 0.64; Hemoglobin 15.4; Platelets 233; Potassium 4.5; Sodium 136   Recent Lipid Panel Lab Results  Component Value Date/Time   CHOL 144 08/27/2022 08:36 AM   TRIG 101 08/27/2022 08:36 AM   HDL 57 08/27/2022 08:36 AM   CHOLHDL 2.5 08/27/2022 08:36 AM   LDLCALC 68 08/27/2022 08:36 AM    Wt Readings from Last 3 Encounters:  06/01/23 146 lb 9.6 oz (66.5 kg)  05/22/23 159 lb 9.6 oz (72.4 kg)  05/05/23 158 lb (71.7 kg)     Objective:    Vital Signs:  Ht 5' 2 (1.575 m)   Wt 146 lb 9.6 oz (66.5 kg)   SpO2 98%   BMI 26.81 kg/m    GEN: Well nourished, well developed in no acute distress HEENT: Normal NECK: No JVD; No carotid bruits LYMPHATICS: No lymphadenopathy CARDIAC: Irregularly irregular, no murmurs, rubs, gallops RESPIRATORY:  Clear to  auscultation without rales, wheezing or rhonchi  ABDOMEN: Soft, non-tender, non-distended MUSCULOSKELETAL:  No edema; No deformity  SKIN: Warm and dry NEUROLOGIC:  Alert and oriented x 3 PSYCHIATRIC:  Normal affect   ASSESSMENT & PLAN:    OSA - The patient is tolerating PAP therapy well without any problems. The PAP download performed by his DME was personally reviewed and interpreted by me today and showed an AHI of 0.3 /hr on 6 cm H2O with 100% compliance in using more than 4 hours nightly.  The patient has been using and benefiting from PAP use and will continue to benefit from therapy.    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  Concerns regarding medicines are outlined above.  Tests Ordered: Orders Placed This Encounter  Procedures   EKG 12-Lead    Medication Changes: No orders of the defined types were placed in this encounter.    Disposition:  Follow up in 1 year(s)  Signed, Rebecca Bihari, MD  06/01/2023 1:25 PM    [facility]

## 2023-06-04 ENCOUNTER — Ambulatory Visit: Payer: Medicare Other | Admitting: Internal Medicine

## 2023-06-04 DIAGNOSIS — N182 Chronic kidney disease, stage 2 (mild): Secondary | ICD-10-CM

## 2023-06-04 DIAGNOSIS — M059 Rheumatoid arthritis with rheumatoid factor, unspecified: Secondary | ICD-10-CM

## 2023-06-04 DIAGNOSIS — Z6826 Body mass index (BMI) 26.0-26.9, adult: Secondary | ICD-10-CM

## 2023-06-04 DIAGNOSIS — D6869 Other thrombophilia: Secondary | ICD-10-CM

## 2023-06-04 DIAGNOSIS — I700 Atherosclerosis of aorta: Secondary | ICD-10-CM

## 2023-06-04 DIAGNOSIS — I480 Paroxysmal atrial fibrillation: Secondary | ICD-10-CM

## 2023-06-04 DIAGNOSIS — G4733 Obstructive sleep apnea (adult) (pediatric): Secondary | ICD-10-CM

## 2023-06-04 DIAGNOSIS — E785 Hyperlipidemia, unspecified: Secondary | ICD-10-CM

## 2023-06-05 ENCOUNTER — Ambulatory Visit: Payer: Medicare Other | Admitting: Cardiology

## 2023-06-15 ENCOUNTER — Encounter: Payer: Self-pay | Admitting: Cardiology

## 2023-06-16 ENCOUNTER — Encounter: Payer: Self-pay | Admitting: Pulmonary Disease

## 2023-06-16 ENCOUNTER — Ambulatory Visit (INDEPENDENT_AMBULATORY_CARE_PROVIDER_SITE_OTHER): Payer: Medicare Other

## 2023-06-16 ENCOUNTER — Ambulatory Visit (INDEPENDENT_AMBULATORY_CARE_PROVIDER_SITE_OTHER): Payer: Medicare Other | Admitting: Pulmonary Disease

## 2023-06-16 VITALS — BP 134/60 | HR 90 | Ht 62.0 in | Wt 160.2 lb

## 2023-06-16 DIAGNOSIS — J90 Pleural effusion, not elsewhere classified: Secondary | ICD-10-CM

## 2023-06-16 NOTE — Patient Instructions (Addendum)
Your chest x-ray is stable compared to last September  Continue to work on your physical activity level  Follow up in 6 months

## 2023-06-16 NOTE — Progress Notes (Unsigned)
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   She has been stable since last visit. Her physical conditioning continues to improve as she is working with PT. She is overall happy with her progress.  OV 02/16/23 She has been feeling great since last visit. Her physical stamina continues to improve. She is doing PT 2 times per week.   OV 11/04/22 She has been feeling well since last visit. She has been watching her salt and fluid intake closely and is down 5lbs from last visit. She did not go on an increased lasix regimen. She is working with PT once a week.   OV 09/22/22 She reports increasing dyspnea over the past week. Her prednisone was decreased to 2.5mg  daily at last visit. She reports a decrease in her tidal volume based on incentive spirometry measurements.   Chest x-ray today shows stable left effusion, but new small right pleural effusion.   Denies cough, fever, chills or sweats.  OV 09/04/22 She had covid vaccine on 3/19 and pneumococcal vaccine 3/15. She held her methotrexate for 2 weeks to get the vaccines. She remains on 5mg  of prednisone.  She has not been as active with her fitness routine. She is requesting referral to [facility].  OV 07/31/22 She has done well on the 5mg  of prednisone daily. No increased shortness of breath since last visit.   Chest radiograph done today shows stable left effusion.  OV 07/01/22 She has done well with tapering to 7.5mg  of prednisone since  last visit. No increase in shortness of breath. She has been transitioned to IM methotrexate from PO and is tolerating it well.   Chest x-ray today shows slightly increased left effusion compared to last month.   OV 05/29/22 She was treated with Zpak at last visit for possible lower respiratory infection based on chest radiograph with concern for right medial lower lobe infiltrate.   CRP and ESR were not elevated at last visit. Chest radiograph was stable.  She has not been doing her workout regimen as she has not felt like it. She has had issues with hypotension when taking her metoprolol. This has been held. She has felt like this over the past 2 weeks. Denies fevers, chills or sweats. Her appetite is down. She has noticed right shoulder discomfort intermittently.  OV 05/01/22 She was tapered to 10mg  daily at last visit on 11/20. She reports not feeling well since Monday with increased dyspnea, reduced appetite and changes in her blood pressure. She was not able to eat her cereal on Monday due to lack of appetite. She reports low BP readings with sBP in the 80s. Her metoprolol dose was decreased in half with improvement in her blood pressure but her baseline HR is 90s-100s. Her appetite has since improved. She does report lower extremity edema, right > left. Denies fevers, chills, sweats, nausea or vomiting.   OV 04/14/22 She was tapered to 12.5mg  of prednisone at last visit. She has been doing well  since last visit. She continues methotrexate weekly.  OV 03/17/22 She was seen by Dr. Dimple Casey 03/03/22. She has continued on methotrexate 15mg  weekly with folic acid. She continues ton 15mg  of prednisone daily.  She has been feeling fine since last visit. Repeat Chest x-ray shows persistent left pleural effusion.   OV 01/21/22 She started methotrexate therapy 7/31 and is currently on 15mg  weekly with folic acid 1mg  daily. She has continued on 15mg  prednisone daily. Recommended 2 months of methotrexate  treatment prior to tapering of her steroids.  She was evalauted by CT surgery, PET scan is negative. She was offered VATs with mechanical pleuredesis or pleurX catheter placement but has opted not to pursue these options at this time. Chest radiograph 8/11 shows mild increase in left pleural effusion.  Her symptoms remain stable at this time on the above regimen.   She expressed frustration with her health and not feeling completely better after the last year and she expresses concern with the immunosuppression side effects.   OV 12/06/21 She was tapered down to 10mg  of prednisone daily on 6/12. She reported  progressive fatigue and shortness of breath over week on 7/3. Chest x-ray showed an increase in her left pleural effusion as well as return of a small right pleural effusion. She was admitted 7/4 and left pigtail chest tube was placed on 7/5. She drained a total of of fluid with minimal drainage there after. CT chest scan after chest tube placement showed LLL infiltrate vs atelecatsis and resolution of the effusion on the left. There was no need for pleural lytic therapy and her lung expanded appropriately. The effusion was again exudative. Cytology was negative for malignancy.  Her prednisone was increased back to 20mg  daily an she continued on azathioprine 100mg  daily. She is feeling much better since before the hospital.  Dr. Dimple Casey of rheumatology was notified. We discussed transition to methotrexate from azathioprine.   Her pleural fluid ADA was not elevated and the AFB smear on pleural fluid is negative. Quantiferon gold is negative.   Chest radiograph today shows return of left pleural effusion, but not as significant as her 7/3 chest x-ray. Right effusion has decreased or resolved.   OV 09/3021 She was seen by [name], NP on 09/27/21 for acute visit due to worsening joint pains and concern for worsening of her pleural effusion. Chest radiograph was overall stable compared to  March. It was recommended she continue on the scheduled prednisone taper.   Since that visit she has not had further issues with joint pains or return of dyspnea. She overall feels well. She is currently taking 15mg  prednisone daily and has stopped atovaquone antibiotic.  She complains of ear fullness and having trouble hearing and is scheduled to see ENT next month.   OV 09/13/21 She remains on slow prednisone taper and is down to 20mg  daily along with atovaquone prophylaxis. She saw Dr. Dimple Casey of rheumatology on 4/17 with no changes to her regimen.   She continues to feel well and continues her weekly exercise and strength regimen. She feels her stamina continues to improve.  OV 08/20/21 She continues to do well since last visit and her stamina is increasing and her balance is getting better. She is getting out to walk in the local parks. She is currently taking 25mg  prednisone daily and atovaquone daily.   OV 07/23/21 She continues on imuran therapy 100mg  daily which has been increased from 50mg  daily by Dr. Dimple Casey her rheumatologist, note from 07/09/21  reviewed. She has started her prednisone taper and is currently taking 30mg  daily. She remains on atovaquone prophylaxis. 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  She has done well since last visit. No increase in shortness of breath. She continues to progress with her physical activity.  OV 06/25/21 She has been doing well since last visit. She was started on imuran per Dr. Dimple Casey and she has follow up with him on 07/09/21. She remains on 40mg  of prednisone daily. She is working with home PT with good results. She reports her daily activities are increasing as she is now able to vacuum 2 rooms in her home compared to 1 previously.   Chest radiograph today shows chronic left pleural effusion, stable from last month.  OV 05/22/21 She was evaluated by Dr. 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 Anti-smooth muscle anitbody is elevated. Cryoglobulins are negative.   Discussed pleural fluid studies further with Dr. Kenard Gower which showed mixed granulocytes and histioctyes along with some multinucleated giant cells. A definite diagnosis can't be placed on these findings, but he did raise concern for rheumatoid arthritis.   These upates were shared with the patient, her husband and friend.   Chest x-ray today shows persistent left pleural effusion and clear right lung.   Patient is feeling much better after increasing her prednisone back to 40mg  daily. She remains on prophylactic antibiotics. Her shoulder pain has also resolved with the prednisone.   She continues to work with PT each week and feels her conditioning in improving.  OV 04/24/21 She was seen in acute visit on 11/28 by [name], NP for increasing shortness of breath and pleuritic chest pain. She had tapered down to 5mg  of prednisone at this time. She had repeat thoracentesis on 04/08/21 based on CT chest from 04/22/21 which continued to show persistent left pleural effusion. Post-thoracentesis x-ray showed left pleural effusion and small right pleural effusion. Chest x-ray on 11/28 shows increased left pleural effusion.   Patient complains of increasing shortness of breath, bilateral shoulder pain and increasing left-sided discomfort.  Labs from visit on 11/16 showed rheumatoid factor I30 previously 57.2 on 01/21/2021.  P ANCA titer was 1:80 on 04/10/2021.  The following labs were negative on 04/10/2021: ANA, anti-CCP, dsDNA antibody and previously negative for SSA and SSB and antihistone antibodies.  OV 03/06/21 She was admitted 8/27 to 9/7 for pericardial effusion and bilateral pleural effusions. She is status post pericardial drain and multiple thoracenteses. The pleural fluid studies were consistent with exudative process which were initially neutrophil predominant that  later became lymphocyte predominant. Cytology was negative for malignancy but showed reactive mesothelial cells.  She is being treated for serositis with prednisone taper and colchicine for the pericardial effusion. She has been on 40mg  of prednisone over the past month. She has been on pneumocystis prophylaxis with atovaquone, she developed skin rash with bactrim. Her inflammatory workup was rather unrevealing as ANA, CCP anti-histone ab and SSA/SSB were negative. Her rheumatoid factor was elevated at 57.2.   She is feeling significantly better since discharge is resuming her normal activity and is quite satisfied with her recovering thus far.   Chest radiograph today shows opacities at the left lung base. The right lung is significantly improved since prior study with resolution of right pleural effusion.  Past Medical History:  Diagnosis Date   A-fib [facility])    Allergy    Atrial fibrillation (HCC)  FH: cholecystectomy 1997   Macular degeneration    OSA (obstructive sleep apnea)    mild with total AHI 7.25/hr and severe during REM sleep at 35/hr now on CPAP at 6cm H2O     Family History  Problem Relation Age of Onset   Osteoporosis Mother    Macular degeneration Mother    Heart Problems Father    Stroke Father    Diabetes Brother    Kidney failure Brother    Cancer Maternal Grandmother    Dementia Paternal Grandmother    Suicidality Other    Heart attack Neg Hx      Social History   Socioeconomic History   Marital status: Married    Spouse name: Not on file   Number of children: Not on file   Years of education: Not on file   Highest education level: Not on file  Occupational History   Not on file  Tobacco Use   Smoking status: Never    Passive exposure: Past   Smokeless tobacco: Never  Vaping Use   Vaping status: Never Used  Substance and Sexual Activity   Alcohol use: Never   Drug use: Never   Sexual activity: Not on file  Other Topics Concern   Not on file   Social History Narrative   ** Merged History Encounter **       Social Drivers of Health   Financial Resource Strain: Not on file  Food Insecurity: No Food Insecurity (07/04/2022)   Hunger Vital Sign    Worried About Running Out of Food in the Last Year: Never true    Ran Out of Food in the Last Year: Never true  Transportation Needs: No Transportation Needs (07/04/2022)   PRAPARE - Administrator, Civil Service (Medical): No    Lack of Transportation (Non-Medical): No  Physical Activity: Not on file  Stress: Not on file  Social Connections: Not on file  Intimate Partner Violence: Not on file     Allergies  Allergen Reactions   Erythromycin Other (See Comments)    Other reaction(s): burning   Azathioprine     Other Reaction(s): WEAKNESS,FATIGUE HAIR LOSS   Sulfa Antibiotics     Other reaction(s): Unknown   Azithromycin Other (See Comments)    Pain in arm with IV infusion    Condrolite [Glucosamine-Chondroitin-Msm] Rash   Glucosamine Rash    Other reaction(s): rash Other reaction(s): rash   Glucosamine Sulfate-Msm Rash   Sulfamethoxazole-Trimethoprim Rash    Other reaction(s): rash Other reaction(s): rash     Outpatient Medications Prior to Visit  Medication Sig Dispense Refill   atenolol (TENORMIN) 25 MG tablet Take half tablet by mouth daily as needed for heart rate >110 45 tablet 3   CALCIUM CITRATE-VITAMIN D PO Take 1 tablet by mouth at bedtime.     carboxymethylcellulose (REFRESH PLUS) 0.5 % SOLN Place 1 drop into both eyes 2 (two) times daily.     ELIQUIS 5 MG TABS tablet TAKE 1 TABLET BY MOUTH TWICE A DAY 60 tablet 6   EPIPEN 2-PAK 0.3 MG/0.3ML SOAJ injection Inject 0.3 mg into the muscle once as needed for anaphylaxis (severe allergic reaction).  0   famotidine (PEPCID) 10 MG tablet Take 1-2 tablets by mouth twice daily as needed 60 tablet 6   folic acid (FOLVITE) 1 MG tablet TAKE 1 TABLET BY MOUTH EVERY DAY 90 tablet 3   furosemide (LASIX) 20 MG  tablet TAKE 1 TABLET BY MOUTH EVERY DAY  90 tablet 1   methotrexate 50 MG/2ML injection Inject 0.5 mLs (12.5 mg total) into the skin once a week. 4 mL 2   Multiple Vitamin (MULTIVITAMIN WITH MINERALS) TABS tablet Take 1 tablet by mouth every morning.     Multiple Vitamins-Minerals (PRESERVISION AREDS PO) Take 1 capsule by mouth 2 (two) times daily.     potassium chloride (KLOR-CON) 20 MEQ packet USE 1 PACKET ONCE DAILY 90 packet 2   predniSONE (DELTASONE) 5 MG tablet TAKE 1 TABLET BY MOUTH EVERY DAY WITH BREAKFAST 30 tablet 3   TUBERCULIN SYR 1CC/26GX3/8" 26G X 3/8" 1 ML MISC Use for methotrexate injection once weekly 25 each 0   zoledronic acid (RECLAST) 5 MG/100ML SOLN injection Inject 5 mg into the vein once.     No facility-administered medications prior to visit.   Review of Systems  Constitutional:  Negative for chills, fever, malaise/fatigue and weight loss.  HENT:  Negative for congestion, sinus pain and sore throat.   Eyes: Negative.   Respiratory:  Positive for shortness of breath (with exertion). Negative for cough, hemoptysis, sputum production and wheezing.   Cardiovascular:  Negative for chest pain, palpitations, orthopnea, claudication and leg swelling.  Gastrointestinal:  Negative for abdominal pain, heartburn, nausea and vomiting.  Genitourinary: Negative.   Musculoskeletal:  Negative for joint pain and myalgias.  Skin:  Negative for rash.  Neurological:  Negative for weakness.  Endo/Heme/Allergies:  Bruises/bleeds easily.  Psychiatric/Behavioral: Negative.     Objective:   Vitals:   06/16/23 1100  BP: 134/60  Pulse: 90  SpO2: 100%  Weight: 160 lb 3.2 oz (72.7 kg)  Height: 5\' 2"  (1.575 m)    Physical Exam Constitutional:      General: She is not in acute distress.    Appearance: She is not ill-appearing.  HENT:     Head: Normocephalic and atraumatic.  Cardiovascular:     Rate and Rhythm: Normal rate and regular rhythm.     Pulses: Normal pulses.     Heart  sounds: Normal heart sounds. No murmur heard. Pulmonary:     Effort: Pulmonary effort is normal.     Breath sounds: Examination of the left-middle field reveals decreased breath sounds. Examination of the right-lower field reveals decreased breath sounds. Examination of the left-lower field reveals decreased breath sounds. Decreased breath sounds present. No wheezing or rhonchi.  Musculoskeletal:     Right lower leg: No edema.     Left lower leg: No edema.  Skin:    General: Skin is warm and dry.  Neurological:     General: No focal deficit present.     Mental Status: She is alert.    CBC    Component Value Date/Time   WBC 7.7 05/05/2023 1109   RBC 5.02 05/05/2023 1109   HGB 15.4 05/05/2023 1109   HGB 15.7 12/27/2018 0831   HCT 46.3 (H) 05/05/2023 1109   HCT 47.4 (H) 12/27/2018 0831   PLT 233 05/05/2023 1109   PLT 213 12/27/2018 0831   MCV 92.2 05/05/2023 1109   MCV 90 12/27/2018 0831   MCH 30.7 05/05/2023 1109   MCHC 33.3 05/05/2023 1109   RDW 14.5 05/05/2023 1109   RDW 13.1 12/27/2018 0831   LYMPHSABS 472 (L) 02/03/2023 1112   MONOABS 0.7 05/01/2022 0933   EOSABS 23 05/05/2023 1109   BASOSABS 39 05/05/2023 1109      Latest Ref Rng & Units 05/05/2023   11:09 AM 02/03/2023   11:12 AM 11/03/2022  3:50 PM  BMP  Glucose 65 - 99 mg/dL 93  91  89   BUN 7 - 25 mg/dL 12  13  13    Creatinine 0.60 - 0.95 mg/dL 6.21  3.08  6.57   BUN/Creat Ratio 6 - 22 (calc) SEE NOTE:  SEE NOTE:  SEE NOTE:   Sodium 135 - 146 mmol/L 136  135  135   Potassium 3.5 - 5.3 mmol/L 4.5  4.8  4.5   Chloride 98 - 110 mmol/L 98  97  98   CO2 20 - 32 mmol/L 31  30  30    Calcium 8.6 - 10.4 mg/dL 9.4  9.5  9.6    Chest imaging: CXR 07/31/22 Unchanged appearance of the chest x-ray, with persisting left-sided pleural effusion and associated atelectasis/consolidation  CXR 07/01/22 Moderate to large left pleural effusion, increased in size when compared with prior exam.  CXR 03/17/22 Unchanged  left-sided pleural effusion with associated atelectasis/consolidation  CXR 12/06/21 Small left pleural effusion, decreased in size when compared with prior exam. Similar trace right pleural effusion.  CXR 11/25/21 1. Increased LEFT pleural effusion, now moderate to large in size. 2. Probable small RIGHT pleural effusion.  CXR 10/22/21 Stable left effusion. No blunting of the right costophrenic angle.  CXR 09/27/21 Mild-to-moderate cardiomegaly. Again seen is the obscuration of the left cardiac border and hemidiaphragm of pleural effusion with likely superimposed atelectasis/consolidation without significant interval change. There is some blunting of the right CP angle seen of likely small pleural effusion  CXR 08/20/21 Similar appearance of the chest x-ray with left-sided pleural effusion and associated atelectasis/consolidation  CXR 06/25/21 1. Stable small left pleural effusion. 2. No new focal lung infiltrate.  CXR 05/22/21 There is a small to medium size left pleural effusion, not significantly changed since 04/24/2021. Aeration of the left lung is also not significantly changed. The previously seen small right effusion has resolved. The right lung is now clear. There is no pneumothorax.  CXR 04/22/21 Increasing LEFT effusion and associated airspace disease. Correlate with any signs of infection. Given persistence/worsening underlying pulmonary or bronchial lesion would be difficult to exclude. Correlate with results of prior thoracentesis and consider follow-up chest CT for further evaluation as warranted.   Trace RIGHT effusion with basilar atelectasis.  CXR 03/06/21 Resolved right-sided pleural effusion with persistent opacity on the left, likely a combination of persisting pleural fluid and associated atelectasis/consolidation  CXR 01/30/21 1. Slight interval decrease in size of the right pleural effusion with improved aeration of the right base. 2. No significant  interval change of the larger left pleural effusion and adjacent airspace disease.  CT Chest 01/03/21 Moderate left and small right pleural effusions, which is increased on the left and new on the right in comparison to prior CT. Adjacent bibasilar and lingular atelectasis.   Increased, moderate-sized pericardial effusion. Biatrial enlargement.   Mild pulmonary edema.  PFT:     No data to display          Labs: 01/21/21: Quantiferon gold indeterminate  Path:  Echo 01/20/21:  1. Left ventricular ejection fraction, by estimation, is 60 to 65%. The  left ventricle has normal function. The left ventricle has no regional  wall motion abnormalities.   2. A small pericardial effusion is present. There is no evidence of  cardiac tamponade.  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  She has stable left pleural effusion, somewhat decreased on today's chest x-ray compared to previous but clinically she is doing well. She is to continue prednisone 5mg  daily and methotrexate weekly.   Follow up in 6 months.  Melody Comas, MD [facility] Office: [phone]   Current Outpatient Medications:    atenolol (TENORMIN) 25 MG tablet, Take half tablet by mouth daily as needed for heart rate >110, Disp: 45 tablet, Rfl: 3   CALCIUM CITRATE-VITAMIN D PO, Take 1 tablet by mouth at bedtime., Disp: , Rfl:    carboxymethylcellulose (REFRESH PLUS) 0.5 % SOLN, Place 1 drop into both eyes 2 (two) times daily., Disp: , Rfl:    ELIQUIS 5 MG TABS tablet, TAKE 1 TABLET BY MOUTH TWICE A DAY, Disp: 60 tablet, Rfl: 6   EPIPEN 2-PAK 0.3 MG/0.3ML SOAJ injection, Inject 0.3 mg into the muscle  once as needed for anaphylaxis (severe allergic reaction)., Disp: , Rfl: 0   famotidine (PEPCID) 10 MG tablet, Take 1-2 tablets by mouth twice daily as needed, Disp: 60 tablet, Rfl: 6   folic acid (FOLVITE) 1 MG tablet, TAKE 1 TABLET BY MOUTH EVERY DAY, Disp: 90 tablet, Rfl: 3   furosemide (LASIX) 20 MG tablet, TAKE 1 TABLET BY MOUTH EVERY DAY, Disp: 90 tablet, Rfl: 1   methotrexate 50 MG/2ML injection, Inject 0.5 mLs (12.5 mg total) into the skin once a week., Disp: 4 mL, Rfl: 2   Multiple Vitamin (MULTIVITAMIN WITH MINERALS) TABS tablet, Take 1 tablet by mouth every morning., Disp: , Rfl:    Multiple Vitamins-Minerals (PRESERVISION AREDS PO), Take 1 capsule by mouth 2 (two) times daily., Disp: , Rfl:    potassium chloride (KLOR-CON) 20 MEQ packet, USE 1 PACKET ONCE DAILY, Disp: 90 packet, Rfl: 2   predniSONE (DELTASONE) 5 MG tablet, TAKE 1 TABLET BY MOUTH EVERY DAY WITH BREAKFAST, Disp: 30 tablet, Rfl: 3   TUBERCULIN SYR 1CC/26GX3/8" 26G X 3/8" 1 ML MISC, Use for methotrexate injection once weekly, Disp: 25 each, Rfl: 0   zoledronic acid (RECLAST) 5 MG/100ML SOLN injection, Inject 5 mg into the vein once., Disp: , Rfl:

## 2023-06-17 ENCOUNTER — Encounter: Payer: Self-pay | Admitting: Pulmonary Disease

## 2023-06-27 ENCOUNTER — Other Ambulatory Visit: Payer: Self-pay | Admitting: Interventional Cardiology

## 2023-06-27 DIAGNOSIS — I480 Paroxysmal atrial fibrillation: Secondary | ICD-10-CM

## 2023-06-29 ENCOUNTER — Telehealth: Payer: Self-pay | Admitting: *Deleted

## 2023-06-29 NOTE — Telephone Encounter (Signed)
Patient contact the office stating she was reviewing her office note for 05/05/2023. Patient states that in the office note it states she has selling in her hands and feet. Patient states this is inaccurate. Patient states she has never had swelling in her hands and feet. Patient states she has had swelling in her ankles which she discussed with you. Patient would like that corrected in her office note.

## 2023-06-29 NOTE — Telephone Encounter (Signed)
Prescription refill request for Eliquis received. Indication:afib Last office visit:1/25 Scr:0.64  12/24 Age: 83 Weight:72.7  kg  Prescription refilled

## 2023-06-30 NOTE — Telephone Encounter (Signed)
Note corrected

## 2023-07-05 ENCOUNTER — Emergency Department (HOSPITAL_COMMUNITY)
Admission: EM | Admit: 2023-07-05 | Discharge: 2023-07-05 | Disposition: A | Discharge status: Home / Self Care | Payer: Medicare Other | Attending: Emergency Medicine | Admitting: Emergency Medicine

## 2023-07-05 ENCOUNTER — Emergency Department (HOSPITAL_COMMUNITY): Payer: Medicare Other

## 2023-07-05 ENCOUNTER — Encounter (HOSPITAL_COMMUNITY): Payer: Self-pay

## 2023-07-05 ENCOUNTER — Other Ambulatory Visit: Payer: Self-pay

## 2023-07-05 DIAGNOSIS — Z7901 Long term (current) use of anticoagulants: Secondary | ICD-10-CM | POA: Insufficient documentation

## 2023-07-05 DIAGNOSIS — W19XXXA Unspecified fall, initial encounter: Secondary | ICD-10-CM

## 2023-07-05 DIAGNOSIS — Y92000 Kitchen of unspecified non-institutional (private) residence as  the place of occurrence of the external cause: Secondary | ICD-10-CM | POA: Insufficient documentation

## 2023-07-05 DIAGNOSIS — I4891 Unspecified atrial fibrillation: Secondary | ICD-10-CM | POA: Insufficient documentation

## 2023-07-05 DIAGNOSIS — S0990XA Unspecified injury of head, initial encounter: Secondary | ICD-10-CM | POA: Diagnosis present

## 2023-07-05 DIAGNOSIS — D329 Benign neoplasm of meninges, unspecified: Secondary | ICD-10-CM

## 2023-07-05 DIAGNOSIS — W01198A Fall on same level from slipping, tripping and stumbling with subsequent striking against other object, initial encounter: Secondary | ICD-10-CM | POA: Insufficient documentation

## 2023-07-05 LAB — BASIC METABOLIC PANEL
Anion gap: 9 (ref 5–15)
BUN: 10 mg/dL (ref 8–23)
CO2: 28 mmol/L (ref 22–32)
Calcium: 8.9 mg/dL (ref 8.9–10.3)
Chloride: 100 mmol/L (ref 98–111)
Creatinine, Ser: 0.71 mg/dL (ref 0.44–1.00)
GFR, Estimated: >60 mL/min (ref >60)
Glucose, Bld: 106 mg/dL — ABNORMAL HIGH (ref 70–99)
Potassium: 4.5 mmol/L (ref 3.5–5.1)
Sodium: 137 mmol/L (ref 135–145)

## 2023-07-05 LAB — CBC
HCT: 46.4 % — ABNORMAL HIGH (ref 36.0–46.0)
Hemoglobin: 15.3 g/dL — ABNORMAL HIGH (ref 12.0–15.0)
MCH: 31 pg (ref 26.0–34.0)
MCHC: 33 g/dL (ref 30.0–36.0)
MCV: 93.9 fL (ref 80.0–100.0)
Platelets: 217 10*3/uL (ref 150–400)
RBC: 4.94 MIL/uL (ref 3.87–5.11)
RDW: 15.9 % — ABNORMAL HIGH (ref 11.5–15.5)
WBC: 6.4 10*3/uL (ref 4.0–10.5)
nRBC: 0 % (ref 0.0–0.2)

## 2023-07-05 MED ORDER — ATENOLOL 25 MG PO TABS: 12.5000 mg | ORAL_TABLET | Freq: Once | ORAL | Status: DC

## 2023-07-05 MED ORDER — GADOBUTROL 1 MMOL/ML IV SOLN
7.0000 mL | Freq: Once | INTRAVENOUS | Status: AC | PRN
  Administered 2023-07-05: 7 mL via INTRAVENOUS

## 2023-07-05 NOTE — ED Notes (Signed)
 Pt ambulated to the RR and back. PT did well no complaints

## 2023-07-05 NOTE — ED Provider Notes (Signed)
 [place] EMERGENCY DEPARTMENT AT [facility] Provider Note   CSN: [id number] Arrival date & time: 07/05/23  1403     History  Chief Complaint  Patient presents with   Rebecca Hoose CATHLINE Short is a 83 y.o. female.  Patient to ED after fall at home. She reports she turned around and lost her balance, falling backward. She hit her head on the cabinet. No LOC or wound. She states she and her husband were unable to get her up so EMS was called. She is anticoagulated on Eliquis  for atrial fibrillation. No chest pain, SOB, recent illness.   The history is provided by the patient and the spouse. No language interpreter was used.  Fall       Home Medications Prior to Admission medications   Medication Sig Start Date End Date Taking? Authorizing Provider  atenolol  (TENORMIN ) 25 MG tablet Take half tablet by mouth daily as needed for heart rate >110 05/30/22   Dann Candyce RAMAN, MD  CALCIUM  CITRATE-VITAMIN D  PO Take 1 tablet by mouth at bedtime.    [provider]  carboxymethylcellulose (REFRESH PLUS) 0.5 % SOLN Place 1 drop into both eyes 2 (two) times daily.    [provider]  ELIQUIS  5 MG TABS tablet TAKE 1 TABLET BY MOUTH TWICE A DAY 06/29/23   Thukkani, Arun K, MD  EPIPEN 2-PAK 0.3 MG/0.3ML SOAJ injection Inject 0.3 mg into the muscle once as needed for anaphylaxis (severe allergic reaction). 07/05/14   [provider]  famotidine  (PEPCID ) 10 MG tablet Take 1-2 tablets by mouth twice daily as needed 01/22/22   Dann Candyce RAMAN, MD  folic acid  (FOLVITE ) 1 MG tablet TAKE 1 TABLET BY MOUTH EVERY DAY 10/06/22   Rice, Lonni ORN, MD  furosemide  (LASIX ) 20 MG tablet TAKE 1 TABLET BY MOUTH EVERY DAY 01/05/23   Dann Candyce RAMAN, MD  methotrexate  50 MG/2ML injection Inject 0.5 mLs (12.5 mg total) into the skin once a week. 05/05/23   Jeannetta Lonni ORN, MD  Multiple Vitamin (MULTIVITAMIN WITH MINERALS) TABS tablet Take 1 tablet by mouth every  morning.    [provider]  Multiple Vitamins-Minerals (PRESERVISION AREDS PO) Take 1 capsule by mouth 2 (two) times daily.    [provider]  potassium chloride  (KLOR-CON ) 20 MEQ packet USE 1 PACKET ONCE DAILY 03/30/23   Thukkani, Arun K, MD  predniSONE  (DELTASONE ) 5 MG tablet TAKE 1 TABLET BY MOUTH EVERY DAY WITH BREAKFAST 04/03/23   Kara Dorn NOVAK, MD  TUBERCULIN SYR 1CC/26GX3/8 26G X 3/8 1 ML MISC Use for methotrexate  injection once weekly 05/05/23   Jeannetta Lonni ORN, MD  zoledronic  acid (RECLAST ) 5 MG/100ML SOLN injection Inject 5 mg into the vein once.    [provider]      Allergies    Erythromycin, Azathioprine , Sulfa  antibiotics, Azithromycin , Condrolite [glucosamine-chondroitin-msm], Glucosamine, Glucosamine sulfate-msm, and Sulfamethoxazole -trimethoprim     Review of Systems   Review of Systems  Physical Exam Updated Vital Signs BP (!) 158/92   Pulse (!) 109   Temp 98.5 F (36.9 C) (Oral)   Resp (!) 33   Ht 5' 2 (1.575 m)   Wt 70.3 kg   SpO2 100%   BMI 28.35 kg/m  Physical Exam Vitals and nursing note reviewed.  Constitutional:      Appearance: She is well-developed.  HENT:     Head: Normocephalic.     Comments: Redness to occipital scalp without hematoma or wound. Mildly tender.  Eyes:     Pupils: Pupils are equal, round, and reactive to light.  Neck:     Vascular: No carotid bruit.  Cardiovascular:     Rate and Rhythm: Tachycardia present. Rhythm irregular.     Heart sounds: No murmur heard. Pulmonary:     Effort: Pulmonary effort is normal.     Breath sounds: Normal breath sounds. No wheezing, rhonchi or rales.  Abdominal:     General: Bowel sounds are normal.     Palpations: Abdomen is soft.     Tenderness: There is no abdominal tenderness. There is no guarding or rebound.  Musculoskeletal:        General: Normal range of motion.     Cervical back: Normal range of motion and neck supple.     Comments: FROM all  extremities without subjective pain or objective limitation. There is no midline spinal tenderness, specifically, no cervical spinal tenderness.   There is a large skin tear to right proximal forearm.   Skin:    General: Skin is warm and dry.  Neurological:     General: No focal deficit present.     Mental Status: She is alert and oriented to person, place, and time.     GCS: GCS eye subscore is 4. GCS verbal subscore is 5. GCS motor subscore is 6.     Cranial Nerves: Cranial nerves 2-12 are intact.     Sensory: No sensory deficit.     Motor: No weakness.     Coordination: Coordination normal.     ED Results / Procedures / Treatments   Labs (all labs ordered are listed, but only abnormal results are displayed) Labs Reviewed  CBC - Abnormal; Notable for the following components:      Result Value   Hemoglobin 15.3 (*)    HCT 46.4 (*)    RDW 15.9 (*)    All other components within normal limits  BASIC METABOLIC PANEL    EKG None  Radiology No results found.  Procedures Procedures    Medications Ordered in ED Medications - No data to display  ED Course/ Medical Decision Making/ A&P                                 Medical Decision Making Patient with mechanical fall after losing balance while cooking, fall backwards, hit head on cabinetry without LOC. On Eliquis  for atrial fibrillation. Presents to ED in A-fib, RVR to 120-130. No chest pain, dizziness, SOB, nausea.   Head and c-spine CT ordered. Labs pending. The patient reports she takes 12.5 mg Atenolol  when her heart rate stays above 110. Will watch HR and, if no improvement, will treat with Atenolol  12.5 mg.   Skin tear to right forearm cleaned and bandaged with vaseline gauze and wrapped. Nonsuturable.   Patient care signed out to [facility], PA-C, pending lab and CT results review and re-evaluation of heart rate for improvement.   Amount and/or Complexity of Data Reviewed Labs: ordered. Radiology:  ordered. ECG/medicine tests: ordered.  Risk Prescription drug management.           Final Clinical Impression(s) / ED Diagnoses Final diagnoses:  Fall, initial encounter  Atrial fibrillation with RVR [facility])    Rx / DC Orders ED Discharge Orders     None         Odell Balls, PA-C 07/05/23 1619    Bernard Drivers, MD 07/06/23 1547

## 2023-07-05 NOTE — ED Provider Notes (Signed)
 Fall on eliquis  for a fib. Heart rate slightly elevated here. May need atenolol  if remains elevated as she normally takes this at home if heart rate goes up  Pending CT head and C spine Physical Exam  BP 139/83   Pulse (!) 104   Temp 98.1 F (36.7 C) (Oral)   Resp 20   Ht 5' 2 (1.575 m)   Wt 70.3 kg   SpO2 100%   BMI 28.35 kg/m   Physical Exam Vitals and nursing note reviewed.  Constitutional:      Appearance: Normal appearance.  HENT:     Head: Normocephalic and atraumatic.     Comments: No hematomas, lacerations, or abrasions.  No skull tenderness to palpation diffusely Cardiovascular:     Rate and Rhythm: Tachycardia present. Rhythm irregular.  Pulmonary:     Effort: Pulmonary effort is normal.  Musculoskeletal:     Comments: No tenderness palpation of the cervical spine.  Able to move about in the bed without difficulty  Neurological:     General: No focal deficit present.     Mental Status: She is alert.  Psychiatric:        Mood and Affect: Mood normal.        Behavior: Behavior normal.     Procedures  Procedures  ED Course / MDM   Clinical Course as of 07/05/23 2246  Sun Jul 05, 2023  1734 Updated patient about the finding of the right parietal mass on her CT.  They are in agreement with plan to proceed with MRI imaging. [AF]    Clinical Course User Index [AF] Rebecca Palma, PA-C   Medical Decision Making Amount and/or Complexity of Data Reviewed Labs: ordered. Radiology: ordered. ECG/medicine tests: ordered.  Risk Prescription drug management.   Patient received as signout.  In short, patient had a mechanical fall earlier today.  Good story for mechanical fall, she was turned around and lost her balance.  Denies any prodromal symptoms, chest pain or shortness of breath.  No loss of consciousness.  CBC and BMP unremarkable.  She did hit the back of her head and so CT head was obtained by previous team.  This showed an incidental meningioma, an  MRI brain with and without contrast was recommended for further evaluation.  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  There is no associated edema or mass effect.  On call neurosurgery with consulted, they recommended follow-up outpatient with Dr. Alm Molt.  His office contact information was placed in patient's discharge and patient was instructed to call their office first thing tomorrow morning to schedule an appointment.   Patient with slight tachycardia due to her persistent A-fib.  Per patient, this is her baseline..  Patient is stable and appropriate for discharge home at this time.         Rebecca Palma, PA-C 07/05/23 2250    Nicholaus Cassondra DEL, MD 07/06/23 [phone]

## 2023-07-05 NOTE — ED Triage Notes (Signed)
 Pt bib Ptar   Pt fell in kitchen and lost balance. Pt states she turned around and lost her balance. Pt hit her head on the cabinets and tile floor in the kitchen. Rt arm has skin burn. No LOC  BP 160/100 HR 139 (Afib Eliquis ) RA 98% RR 18 CBG 143

## 2023-07-05 NOTE — Discharge Instructions (Addendum)
 As discussed, please follow-up with neurosurgeon Dr. Alm Molt regarding your meningioma found on the CT/MRI today.  His contact information is listed below.  Please call tomorrow morning and set up an appointment as soon as possible. I have attached the MRI results below.    On the CT scan of the brain, they also noted some mild age-related atrophy and chronic microvascular ischemic changes.  No skull fractures or brain bleeds.  There were no abnormalities on your cervical spine CT.  For your skin tear, please clean the area with soap and water daily and apply an antibiotic ointment, then the nonstick gauze, and then the gauze wrap on top of the area.  Please do this until the area is fully scabbed over.  If the area starts to get red or have pus drainage, please visit your PCP or return to the ER for further evaluation as it may be infected.  Return to the ER for any vision changes, loss of consciousness, severe headache, any other new or concerning symptoms.    CLINICAL DATA:  Initial evaluation for brain mass.   EXAM: MRI HEAD WITHOUT AND WITH CONTRAST   TECHNIQUE: Multiplanar, multiecho pulse sequences of the brain and surrounding structures were obtained without and with intravenous contrast.   CONTRAST:  7mL GADAVIST  GADOBUTROL  1 MMOL/ML IV SOLN   COMPARISON:  CT from earlier the same day.   FINDINGS: Brain: Cerebral volume within normal limits. Few scattered subcentimeter foci of T2/FLAIR hyperintensity noted involving the supratentorial cerebral white matter, most likely related to chronic microvascular ischemic disease, less than is typically seen for patient age.   No evidence for acute or subacute infarct. Gray-white matter digestion maintained. No areas of chronic cortical infarction. No acute or chronic intracranial blood products.   2.4 x 2.2 x 2.3 cm well-circumscribed ovoid enhancing mass extra-axial mass at the para falcine anterior right frontal  lobe, consistent with a meningioma (series 10, image 42). No associated edema or significant regional mass effect within the adjacent right frontal lobe. No other mass lesion or abnormal enhancement. No midline shift. Ventricles normal size without hydrocephalus. No extra-axial fluid collection. Pituitary gland suprasellar region within normal limits.   Vascular: Major intracranial vascular flow voids are maintained.   Skull and upper cervical spine: Craniocervical junction within normal limits. Bone marrow signal intensity normal. No scalp soft tissue abnormality.   Sinuses/Orbits: Prior bilateral ocular lens replacement. Paranasal sinuses are largely clear. No mastoid effusion.   Other: None.   IMPRESSION: 1. 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 No associated edema or significant regional mass effect. 2. Otherwise essentially normal brain MRI for age.     Electronically Signed   By: Morene Hoard M.D.   On: 07/05/2023 20:07

## 2023-07-20 ENCOUNTER — Encounter: Payer: Self-pay | Admitting: *Deleted

## 2023-07-25 ENCOUNTER — Other Ambulatory Visit: Payer: Self-pay | Admitting: Pulmonary Disease

## 2023-07-25 DIAGNOSIS — J90 Pleural effusion, not elsewhere classified: Secondary | ICD-10-CM

## 2023-07-28 NOTE — Progress Notes (Unsigned)
 Office Visit Note  Patient: Rebecca Short             Date of Birth: Oct 27, 1940           MRN: [id number]             PCP: Daisy Floro, MD Referring: Daisy Floro, MD Visit Date: 08/11/2023   Subjective:  Follow-up (Patient states last time she was here the note says there is swelling in her hands and feet. Patient states she feels that is wrong and that she only has swelling in her ankles. Patient states she would like to talk about her methotrexate syringes. Patient states she did have a fall. Patient states she finished her physical therapy after meeting her goals. )   History of Present Illness: Rebecca Short is a 83 y.o. female here for follow up for seropositive RA primarily pleural effusion involvement on MTX 12.5 mg [place] weekly and folic acid 1 mg daily and prednisone 5 mg daily.    Previous HPI 05/05/2023 Rebecca Short is a 83 y.o. female here for follow up for seropositive RA primarily pleural effusion involvement on MTX 12.5 mg [place] weekly and folic acid 1 mg daily and prednisone 5 mg daily.  She presents with concerns about worsening bone density. They report a previous bone density measurement of -4, which improved to -2.9 on most recent DEXA after two years of self-directed exercises. Despite this improvement, the patient acknowledges that their bone density remains in the osteoporosis range. They express frustration with the impact of prednisone on their bone density, describing it as a constant battle.   The patient also reports edema in their ankles, which they monitor daily for changes in size. They have tried compression socks for relief, but found them uncomfortable, particularly around the toes. They express interest in trying open-toe compression socks as a potential solution.   In addition to these concerns, the patient mentions a history of sensitivity to various medications, including Fosamax and methotrexate, which they were reluctant to take. They express a  similar hesitance towards starting new treatments for their osteoporosis, but seem open to considering an annual Reclast infusion after discussing it with their physician.   The patient's overall health status appears to be stable, with no reported changes in symptoms or new health issues since their last visit. They deny experiencing any shortness of breath or worsening of their edema.    Previous HPI 02/03/2023 Rebecca Short is a 83 y.o. female here for follow up for seropositive RA primarily pleural effusion involvement on MTX 12.5 mg [place] weekly and folic acid 1 mg daily and prednisone 5 mg daily.  Since our last visit symptoms are stable and doing pretty well from a functional standpoint.  She is working with physical therapy on her mobility and balance.  Feels her shortness of breath with movement and talking is partially improved.  Last x-ray with Dr. Francine Graven in June with stable left pleural effusion possible improvement in right side.  Still has persistent bruising on extremities no new skin tears.   Previous HPI 11/03/2022 Rebecca Short is a 83 y.o. female here for follow up for seropositive RA on MTX 12.5 mg [place] weekly and folic acid 1 mg daily and prednisone 5 mg daily.  Since our last visit she tapered prednisone down to 5 mg daily which was tolerable but subsequent tapering to 2.5 mg caused worsening of symptoms with shortness of breath, decreased lung volume on  at home incentive spirometry, and was found to have right lung effusion at pulmonology clinic evaluation.  She is now back to 5 mg daily.  Also had some adjustment of Lasix dosing for pedal edema with elevated BNP is currently taking 20 mg daily.   Previous HPI 08/04/22 Rebecca Short is a 83 y.o. female here for follow up for seropositive RA association with serositis and persistent left pleural effusion on methotrexate 15 mg subcu weekly and folic acid 1 mg daily and prednisone 5 mg daily.  Since switching from oral to subcu  methotrexate she had a great improvement in medication tolerance no longer feeling nauseated and extremely fatigued for 2 to 3 days after each dose.  Right shoulder pain is doing much better after the steroid injection in January as well.  She continued prednisone tapering with close pulmonology follow-up down to 5 mg daily now and has not noticed any significant worsening of symptoms.  She is interested in getting off the remaining steroids also interested in getting updated vaccines to feel safer with more social contacts.   Previous HPI 06/04/22 Rebecca Short is a 83 y.o. 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 She had some exacerbation with edema and dyspnea but stable pleural effusion on imaging. Labs with slightly worsening of lymphopenia last month as well.  She is noticing what she thinks is a bit more nausea and fatigue type side effects after the methotrexate weekly dose than before taking up to a few days to fully recover rather than less than 1.  Since tapering the steroid dose down to 7.5 mg about a week ago she is experiencing increased in shoulder pain and stiffness on both sides.     Previous HPI 03/03/22 Rebecca Short is a 83 y.o. 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 So far her respiratory symptoms are doing well since last visit. Her mood and energy level are very poor, and frustrated about inability to socialize and limited exertion tolerance. She reports alternating days of more and less energy, sometimes associated with the methotrexate doses on Mondays but not always. Also having some concentration and memory difficulties increased from before for a few months.   Previous HPI 12/30/2021 Rebecca Short is a 83 y.o. female here for follow up rheumatoid pleural effusion on methotrexate 6 tablets  once weekly, folic acid 1 mg daily. She was hospitalized last month with recurrent pleural effusion 7/4-7/7 requiring repeat thoracocentesis. Since discharge she is back onto higher dose prednisone and symptoms are doing okay. We stopped her azathioprine due to apparent treatment failure and she started methotrexate since 7/31 after negative hepatitis screening. She has not seen any problems with GI intolerance. She does feel somewhat unsteady or lightheaded and having to move slowly on account of this.   Previous HPI 09/09/2021 Rebecca Short is a 83 y.o. 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 She is doing well since last visit without major events. She has some mild GI irritation with the azathioprine. She has significant bruising on her hands and forearms. She saw Dr. Francine Graven for follow up with stable pulmonary symptoms and repeat chest xray obtained 3/28 was also stable. She is  tapering the prednisone of a rate about 5 mg/day dose per month currently down to 20 mg daily. She is curious about safety going to the dentist for procedures on her medications.   Previous HPI 07/09/21 Rebecca Short is a 83 y.o. female here for follow up for rheumatoid pleural effusions after starting azathioprine 50 mg last month, continuing prednisone 40 mg daily and prophylactic atovaquone. She went for repeat chest xray with Dr. Francine Graven that looked stable 2 weeks ago. So far she denies any major side effects after starting imuran.   Previous HPI 05/08/21 Rebecca Short is a 83 y.o. female here for evaluation with recurrent pleural effusions and dyspnea with positive RF and ANCA Abs. She was hospitalized in August after progressively worsening dyspnea and hypotension with exudative pleural fluid removed from left lung space followed by pericardial drainage and initial treatment with colchicine. She subsequently underwent additional  thoracentesis drainage of effusions later in August and beginning of September. Results showed neutrophil predominant fluid. Prednisone 40 mg daily was added to treatment with stable condition. This continued for a month but tapering the prednisone resulted in more rapid re accumulation of fluid on a dose of about 20 gm daily or less. Imaging demonstrated progressive increase again in left sided effusions and repeat drainage again on 11/14 and 11/30 when down to 10 mg and 5 mg daily doses of prednisone along with colchicine. She is now continuing prednisone 40 mg with atovaquone prophylaxis and colchicine 0.6 mg daily. In addition to pulmonary symptoms she reports bilateral shoulder pain and stiffness developing around the initial onset of her dyspnea before hospitalization in August.  This improved completely on 40 mg prednisone but she noticed definite recurrence of shoulder pain symptoms when tapering down the prednisone to 10 or 5 mg/day dose.  No active complaints today since she is back on higher dose medication.  She denies any peripheral joint pain involvement did not notice any significant swelling or numbness in her upper extremities associated with the pain.   Review of Systems  Constitutional:  Positive for fatigue.  HENT:  Negative for mouth sores and mouth dryness.   Eyes:  Positive for dryness.  Respiratory:  Negative for shortness of breath.   Cardiovascular:  Negative for chest pain and palpitations.  Gastrointestinal:  Negative for blood in stool, constipation and diarrhea.  Endocrine: Negative for increased urination.  Genitourinary:  Positive for involuntary urination.  Musculoskeletal:  Positive for morning stiffness. Negative for joint pain, gait problem, joint pain, joint swelling, myalgias, muscle weakness, muscle tenderness and myalgias.  Skin:  Positive for color change and sensitivity to sunlight. Negative for rash and hair loss.  Allergic/Immunologic: Negative for  susceptible to infections.  Neurological:  Negative for dizziness and headaches.  Hematological:  Negative for swollen glands.  Psychiatric/Behavioral:  Negative for depressed mood and sleep disturbance. The patient is not nervous/anxious.     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   Pleural effusion 11/26/2021   Serositis (HCC) 09/27/2021   Fatigue 09/27/2021   Macular pucker, left eye 06/18/2021   High risk medication use 05/30/2021   Bilateral shoulder pain 05/08/2021   Vitamin D deficiency 05/08/2021   Abnormal ANCA test 05/08/2021   RA (rheumatoid arthritis) (HCC) 04/22/2021   S/P thoracentesis    Arterial hypotension    Pleural effusion on left    Pericardial effusion  Leukocytosis 01/03/2021   Community acquired pneumonia 01/03/2021   Atrial fibrillation (HCC) 12/24/2020   Atrial fibrillation with RVR (HCC) 12/23/2020   Musculoskeletal pain 12/23/2020   Posterior vitreous detachment of both eyes 04/30/2020   Macular pucker, right eye 11/08/2019   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HCC) 11/08/2019   Intermediate stage nonexudative age-related macular degeneration of both eyes 11/08/2019   Anticoagulated 11/18/2016   Bradycardia 03/23/2014   OSA (obstructive sleep apnea) 01/19/2014   Obesity (BMI 30-39.9) 01/19/2014   Paroxysmal A-fib (HCC) 10/04/2013   Nonspecific abnormal results of cardiovascular function study 07/21/2013   Acute gastroenteritis 07/15/2013    Past Medical History:  Diagnosis Date   A-fib [facility])    Allergy    Atrial fibrillation (HCC)    FH: cholecystectomy 1997   Macular degeneration    OSA (obstructive sleep apnea)    mild with total AHI 7.25/hr and severe during REM sleep at 35/hr now on CPAP at 6cm H2O    Family History  Problem Relation Age of Onset    Osteoporosis Mother    Macular degeneration Mother    Heart Problems Father    Stroke Father    Diabetes Brother    Kidney failure Brother    Cancer Maternal Grandmother    Dementia Paternal Grandmother    Suicidality Other    Heart attack Neg Hx    Past Surgical History:  Procedure Laterality Date   CHEST TUBE INSERTION Left 11/27/2021   Procedure: CHEST TUBE INSERTION;  Surgeon: Martina Sinner, MD;  Location: [facility] ENDOSCOPY;  Service: Pulmonary;  Laterality: Left;  Prefer AM slot, thanks   CHOLECYSTECTOMY     PERICARDIOCENTESIS N/A 01/07/2021   Procedure: PERICARDIOCENTESIS;  Surgeon: Yvonne Kendall, MD;  Location: [facility] INVASIVE CV LAB;  Service: Cardiovascular;  Laterality: N/A;   REPLACEMENT TOTAL KNEE BILATERAL     THORACENTESIS N/A 01/25/2021   Procedure: THORACENTESIS;  Surgeon: Martina Sinner, MD;  Location: [facility] ENDOSCOPY;  Service: Pulmonary;  Laterality: N/A;   THORACENTESIS N/A 04/08/2021   Procedure: THORACENTESIS;  Surgeon: Martina Sinner, MD;  Location: [facility] ENDOSCOPY;  Service: Pulmonary;  Laterality: N/A;   THORACENTESIS N/A 04/24/2021   Procedure: THORACENTESIS;  Surgeon: Lupita Leash, MD;  Location: [facility] ENDOSCOPY;  Service: Cardiopulmonary;  Laterality: N/A;   Social History   Social History Narrative   ** Merged History Encounter **       Immunization History  Administered Date(s) Administered   Influenza, High Dose Seasonal PF 07/05/2014, 03/03/2015, 06/29/2015, 07/09/2016, 03/03/2017, 07/24/2017, 02/05/2018, 06/23/2018, 06/08/2019, 02/18/2020, 06/08/2020   Influenza, Quadrivalent, Recombinant, Inj, Pf 03/04/2019   Influenza-Unspecified 01/24/2014, 02/23/2023   PFIZER Comirnaty(Gray Top)Covid-19 Tri-Sucrose Vaccine 02/25/2023   PFIZER(Purple Top)SARS-COV-2 Vaccination 06/16/2019, 07/07/2019, 02/18/2020, 06/08/2020   PNEUMOCOCCAL CONJUGATE-20 08/05/2022   Pneumococcal Polysaccharide-23 03/22/2015, 06/29/2015, 07/09/2016, 07/24/2017, 06/23/2018,  06/08/2019, 06/08/2020, 06/03/2021   RSV,unspecified 02/27/2023   Zoster Recombinant(Shingrix) 03/09/2017     Objective: Vital Signs: BP 120/83 (BP Location: Left Arm, Patient Position: Sitting, Cuff Size: Large)   Pulse (!) 108   Resp 14   Ht 5\' 2"  (1.575 m)   Wt 154 lb (69.9 kg)   BMI 28.17 kg/m    Physical Exam   Musculoskeletal Exam: ***  CDAI Exam: CDAI Score: -- Patient Global: --; Provider Global: -- Swollen: --; Tender: -- Joint Exam 08/11/2023   No joint exam has been documented for this visit   There is currently no information documented on the homunculus.  Go to the Rheumatology activity and complete the homunculus joint exam.  Investigation: No additional findings.  Imaging: No results found.   Recent Labs: Lab Results  Component Value Date   WBC 6.4 07/05/2023   HGB 15.3 (H) 07/05/2023   PLT 217 07/05/2023   NA 137 07/05/2023   K 4.5 07/05/2023   CL 100 07/05/2023   CO2 28 07/05/2023   GLUCOSE 106 (H) 07/05/2023   BUN 10 07/05/2023   CREATININE 0.71 07/05/2023   BILITOT 0.8 05/05/2023   ALKPHOS 51 05/01/2022   AST 19 05/05/2023   ALT 20 05/05/2023   PROT 6.0 (L) 05/05/2023   ALBUMIN 3.7 05/01/2022   CALCIUM 8.9 07/05/2023   GFRAA 90 12/27/2018   QFTBGOLDPLUS Negative 11/28/2021    Speciality Comments: No specialty comments available.  Procedures:  No procedures performed Allergies: Erythromycin, Azathioprine, Sulfa antibiotics, Azithromycin, Condrolite [glucosamine-chondroitin-msm], Glucosamine, Glucosamine sulfate-msm, and Sulfamethoxazole-trimethoprim   Assessment / Plan:     Visit Diagnoses: Rheumatoid arthritis involving multiple sites with positive rheumatoid factor (HCC)  High risk medication use - methotrexate 12.5 mg subcu weekly and folic acid 1 mg daily  Long term (current) use of systemic steroids - prednisone 5 mg daily  Vitamin D deficiency  Age-related osteoporosis without current pathological fracture - Worsening  bone density despite exercise regimen. High risk of fracture over the next 10 years (25%). Consider Reclast (zoledronic acid) infusion once a year.  Peripheral edema  ***  Orders: No orders of the defined types were placed in this encounter.  No orders of the defined types were placed in this encounter.    Follow-Up Instructions: No follow-ups on file.   Fuller Plan, MD  Note - This record has been created using [employer].  Chart creation errors have been sought, but may not always  have been located. Such creation errors do not reflect on  the standard of medical care.

## 2023-08-01 ENCOUNTER — Other Ambulatory Visit: Payer: Self-pay | Admitting: Internal Medicine

## 2023-08-01 DIAGNOSIS — M0579 Rheumatoid arthritis with rheumatoid factor of multiple sites without organ or systems involvement: Secondary | ICD-10-CM

## 2023-08-03 NOTE — Telephone Encounter (Signed)
 Last Fill: 05/05/2023  Labs: 07/05/2023 Hemoglobin 15.3, HCT 46.4, RDW 15.9, Glucose 106  Next Visit: 08/11/2023  Last Visit: 05/05/2023  DX: Rheumatoid arthritis involving multiple sites with positive rheumatoid factor   Current Dose per office note 05/05/2023: methotrexate 12.5 mg subcu weekly   Okay to refill Methotrexate?

## 2023-08-11 ENCOUNTER — Encounter: Payer: Self-pay | Admitting: Internal Medicine

## 2023-08-11 ENCOUNTER — Ambulatory Visit: Payer: Medicare Other | Attending: Internal Medicine | Admitting: Internal Medicine

## 2023-08-11 VITALS — BP 120/83 | HR 108 | Resp 14 | Ht 62.0 in | Wt 154.0 lb

## 2023-08-11 DIAGNOSIS — Z79899 Other long term (current) drug therapy: Secondary | ICD-10-CM | POA: Diagnosis present

## 2023-08-11 DIAGNOSIS — R600 Localized edema: Secondary | ICD-10-CM | POA: Diagnosis present

## 2023-08-11 DIAGNOSIS — E559 Vitamin D deficiency, unspecified: Secondary | ICD-10-CM

## 2023-08-11 DIAGNOSIS — M810 Age-related osteoporosis without current pathological fracture: Secondary | ICD-10-CM

## 2023-08-11 DIAGNOSIS — Z7952 Long term (current) use of systemic steroids: Secondary | ICD-10-CM

## 2023-08-11 DIAGNOSIS — M0579 Rheumatoid arthritis with rheumatoid factor of multiple sites without organ or systems involvement: Secondary | ICD-10-CM

## 2023-08-11 MED ORDER — FOLIC ACID 1 MG PO TABS: 1.0000 mg | ORAL_TABLET | Freq: Every day | ORAL | 3 refills | Status: AC

## 2023-08-11 NOTE — Progress Notes (Cosign Needed)
 Office Visit Note  Patient: Rebecca Short             Date of Birth: [birth year]           MRN: [id number]             PCP: Daisy Floro, MD Referring: Daisy Floro, MD Visit Date: 08/11/2023   Subjective:  Follow-up (Patient states last time she was here the note says there is swelling in her hands and feet. Patient states she feels that is wrong and that she only has swelling in her ankles. Patient states she would like to talk about her methotrexate syringes. Patient states she did have a fall. Patient states she finished her physical therapy after meeting her goals. )   History of Present Illness:   Rebecca Short is a 83 y.o. 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 Is currently on 12.5mg  [place] weekly and 1 mg of folic acid daily. She is also on prednisone 5mg  daily. Has not had any joint pain. She did suffer a mechanical fall in February. Went to the ED as she is on eliquis for afib. Incidentally, it was discovered that she had a meningioma that had slightly grown in size, however she has been asymptomatic from that. She did hit her arm and scraped it, she also hit her hip. No fractures. Her hip pain has been improving.    She denies any worsening SOB, chest pain, or pleurisy. Recently saw pulmonology and has a slightly decreased pleural effusion on the left.   Ms. Tash is also wondering about the current size of her needles, as her pharmacy does not carry the ones she was prescribed and has longer ones.   Previous HPI Rebecca Short is  a 83 y.o. female here for follow up for seropositive RA primarily pleural effusion involvement on MTX 12.5 mg [place] weekly and folic acid 1 mg daily and prednisone 5 mg daily.    Previous HPI 05/05/2023 NAIOMI Short is a 83 y.o. female here for follow up for seropositive RA primarily pleural effusion involvement on MTX 12.5 mg [place] weekly and folic acid 1 mg daily and prednisone 5 mg daily.  She presents with  concerns about worsening bone density. They report a previous bone density measurement of -4, which improved to -2.9 on most recent DEXA after two years of self-directed exercises. Despite this improvement, the patient acknowledges that their bone density remains in the osteoporosis range. They express frustration with the impact of prednisone on their bone density, describing it as a constant battle.   The patient also reports edema in their ankles, which they monitor daily for changes in size. They have tried compression socks for relief, but found them uncomfortable, particularly around the toes. They express interest in trying open-toe compression socks as a potential solution.   In addition to these concerns, the patient mentions a history of sensitivity to various medications, including Fosamax and methotrexate, which they were reluctant to take. They express a similar hesitance towards starting new treatments for their osteoporosis, but seem open to considering an annual Reclast infusion after discussing it with their physician.   The patient's overall health status appears to be stable, with no reported changes in symptoms or new health issues since their last visit. They deny experiencing any shortness of breath or worsening of their edema.    Previous HPI 02/03/2023 Rebecca Short is a 83 y.o. female here  for follow up for seropositive RA primarily pleural effusion involvement on MTX 12.5 mg [place] weekly and folic acid 1 mg daily and prednisone 5 mg daily.  Since our last visit symptoms are stable and doing pretty well from a functional standpoint.  She is working with physical therapy on her mobility and balance.  Feels her shortness of breath with movement and talking is partially improved.  Last x-ray with Dr. Francine Graven in June with stable left pleural effusion possible improvement in right side.  Still has persistent bruising on extremities no new skin tears.   Previous HPI 11/03/2022 TAYVIA Short is a 83 y.o. female here for follow up for seropositive RA on MTX 12.5 mg [place] weekly and folic acid 1 mg daily and prednisone 5 mg daily.  Since our last visit she tapered prednisone down to 5 mg daily which was tolerable but subsequent tapering to 2.5 mg caused worsening of symptoms with shortness of breath, decreased lung volume on at home incentive spirometry, and was found to have right lung effusion at pulmonology clinic evaluation.  She is now back to 5 mg daily.  Also had some adjustment of Lasix dosing for pedal edema with elevated BNP is currently taking 20 mg daily.   Previous HPI 08/04/22 Rebecca Short is a 83 y.o. female here for follow up for seropositive RA association with serositis and persistent left pleural effusion on methotrexate 15 mg subcu weekly and folic acid 1 mg daily and prednisone 5 mg daily.  Since switching from oral to subcu methotrexate she had a great improvement in medication tolerance no longer feeling nauseated and extremely fatigued for 2 to 3 days after each dose.  Right shoulder pain is doing much better after the steroid injection in January as well.  She continued prednisone tapering with close pulmonology follow-up down to 5 mg daily now and has not noticed any significant worsening of symptoms.  She is interested in getting off the remaining steroids also interested in getting updated vaccines to feel safer with more social contacts.   Previous HPI 06/04/22 MIHAELA Short is a 83 y.o. 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 She had some exacerbation with edema and dyspnea but stable pleural effusion on imaging. Labs with slightly worsening of lymphopenia last month as well.  She is noticing what she thinks is a bit more nausea and fatigue type side effects after the methotrexate weekly dose than before taking up to a few days to fully recover rather than less than 1.   Since tapering the steroid dose down to 7.5 mg about a week ago she is experiencing increased in shoulder pain and stiffness on both sides.     Previous HPI 03/03/22 MYELLE POTEAT is a 83 y.o. 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 So far her respiratory symptoms are doing well since last visit. Her mood and energy level are very poor, and frustrated about inability to socialize and limited exertion tolerance. She reports alternating days of more and less energy, sometimes associated with the methotrexate doses on Mondays but not always. Also having some concentration and memory difficulties increased from before for a few months.   Previous HPI 12/30/2021 DEVOIRY CORRIHER is a 83 y.o. female here for follow  up rheumatoid pleural effusion on methotrexate 6 tablets once weekly, folic acid 1 mg daily. She was hospitalized last month with recurrent pleural effusion 7/4-7/7 requiring repeat thoracocentesis. Since discharge she is back onto higher dose prednisone and symptoms are doing okay. We stopped her azathioprine due to apparent treatment failure and she started methotrexate since 7/31 after negative hepatitis screening. She has not seen any problems with GI intolerance. She does feel somewhat unsteady or lightheaded and having to move slowly on account of this.   Previous HPI 09/09/2021 GERALDYNE BARRACLOUGH is a 83 y.o. 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 She is doing well since last visit without major events. She has some mild GI irritation with the azathioprine. She has significant bruising on her hands and forearms. She saw Dr. Francine Graven for follow up with stable pulmonary symptoms and repeat chest xray obtained 3/28 was also stable. She is tapering the prednisone of a rate about 5 mg/day dose per month currently down to 20 mg  daily. She is curious about safety going to the dentist for procedures on her medications.   Previous HPI 07/09/21 DELESA KAWA is a 83 y.o. female here for follow up for rheumatoid pleural effusions after starting azathioprine 50 mg last month, continuing prednisone 40 mg daily and prophylactic atovaquone. She went for repeat chest xray with Dr. Francine Graven that looked stable 2 weeks ago. So far she denies any major side effects after starting imuran.   Previous HPI 05/08/21 ANNIELEE JEMMOTT is a 83 y.o. female here for evaluation with recurrent pleural effusions and dyspnea with positive RF and ANCA Abs. She was hospitalized in August after progressively worsening dyspnea and hypotension with exudative pleural fluid removed from left lung space followed by pericardial drainage and initial treatment with colchicine. She subsequently underwent additional thoracentesis drainage of effusions later in August and beginning of September. Results showed neutrophil predominant fluid. Prednisone 40 mg daily was added to treatment with stable condition. This continued for a month but tapering the prednisone resulted in more rapid re accumulation of fluid on a dose of about 20 gm daily or less. Imaging demonstrated progressive increase again in left sided effusions and repeat drainage again on 11/14 and 11/30 when down to 10 mg and 5 mg daily doses of prednisone along with colchicine. She is now continuing prednisone 40 mg with atovaquone prophylaxis and colchicine 0.6 mg daily. In addition to pulmonary symptoms she reports bilateral shoulder pain and stiffness developing around the initial onset of her dyspnea before hospitalization in August.  This improved completely on 40 mg prednisone but she noticed definite recurrence of shoulder pain symptoms when tapering down the prednisone to 10 or 5 mg/day dose.  No active complaints today since she is back on higher dose medication.  She denies any peripheral joint pain  involvement did not notice any significant swelling or numbness in her upper extremities associated with the pain.   Review of Systems  Constitutional:  Positive for fatigue.  HENT:  Negative for mouth sores and mouth dryness.   Eyes:  Positive for dryness.  Respiratory:  Negative for shortness of breath.   Cardiovascular:  Negative for chest pain and palpitations.  Gastrointestinal:  Negative for blood in stool, constipation and diarrhea.  Endocrine: Negative for increased urination.  Genitourinary:  Positive for involuntary urination.  Musculoskeletal:  Positive for morning stiffness. Negative for joint pain, gait problem, joint pain,  joint swelling, myalgias, muscle weakness, muscle tenderness and myalgias.  Skin:  Positive for color change and sensitivity to sunlight. Negative for rash and hair loss.  Allergic/Immunologic: Negative for susceptible to infections.  Neurological:  Negative for dizziness and headaches.  Hematological:  Negative for swollen glands.  Psychiatric/Behavioral:  Negative for depressed mood and sleep disturbance. The patient is not nervous/anxious.     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   Pleural effusion 11/26/2021   Serositis (HCC) 09/27/2021   Fatigue 09/27/2021   Macular pucker, left eye 06/18/2021   High risk medication use 05/30/2021   Bilateral shoulder pain 05/08/2021   Vitamin D deficiency 05/08/2021   Abnormal ANCA test 05/08/2021   RA (rheumatoid arthritis) (HCC) 04/22/2021   S/P thoracentesis    Arterial hypotension    Pleural effusion on left    Pericardial effusion    Leukocytosis 01/03/2021   Community acquired pneumonia 01/03/2021   Atrial fibrillation (HCC) 12/24/2020   Atrial fibrillation with RVR (HCC) 12/23/2020   Musculoskeletal pain 12/23/2020   Posterior vitreous detachment of both eyes 04/30/2020    Macular pucker, right eye 11/08/2019   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HCC) 11/08/2019   Intermediate stage nonexudative age-related macular degeneration of both eyes 11/08/2019   Anticoagulated 11/18/2016   Bradycardia 03/23/2014   OSA (obstructive sleep apnea) 01/19/2014   Obesity (BMI 30-39.9) 01/19/2014   Paroxysmal A-fib (HCC) 10/04/2013   Nonspecific abnormal results of cardiovascular function study 07/21/2013   Acute gastroenteritis 07/15/2013    Past Medical History:  Diagnosis Date   A-fib [facility])    Allergy    Atrial fibrillation (HCC)    FH: cholecystectomy 1997   Macular degeneration    OSA (obstructive sleep apnea)    mild with total AHI 7.25/hr and severe during REM sleep at 35/hr now on CPAP at 6cm H2O    Family History  Problem Relation Age of Onset   Osteoporosis Mother    Macular degeneration Mother    Heart Problems Father    Stroke Father    Diabetes Brother    Kidney failure Brother    Cancer Maternal Grandmother    Dementia Paternal Grandmother    Suicidality Other    Heart attack Neg Hx    Past Surgical History:  Procedure Laterality Date   CHEST TUBE INSERTION Left 11/27/2021   Procedure: CHEST TUBE INSERTION;  Surgeon: Martina Sinner, MD;  Location: [facility] ENDOSCOPY;  Service: Pulmonary;  Laterality: Left;  Prefer AM slot, thanks   CHOLECYSTECTOMY     PERICARDIOCENTESIS N/A 01/07/2021   Procedure: PERICARDIOCENTESIS;  Surgeon: Yvonne Kendall, MD;  Location: [facility] INVASIVE CV LAB;  Service: Cardiovascular;  Laterality: N/A;   REPLACEMENT TOTAL KNEE BILATERAL     THORACENTESIS N/A 01/25/2021   Procedure: THORACENTESIS;  Surgeon: Martina Sinner, MD;  Location: [facility] ENDOSCOPY;  Service: Pulmonary;  Laterality: N/A;   THORACENTESIS N/A 04/08/2021   Procedure: THORACENTESIS;  Surgeon: Martina Sinner, MD;  Location: [facility] ENDOSCOPY;  Service: Pulmonary;  Laterality: N/A;   THORACENTESIS N/A  04/24/2021   Procedure: THORACENTESIS;  Surgeon: Lupita Leash, MD;  Location: [facility] ENDOSCOPY;  Service: Cardiopulmonary;  Laterality: N/A;   Social History   Social History Narrative   ** Merged History Encounter **       Immunization History  Administered Date(s) Administered  Influenza, High Dose Seasonal PF 07/05/2014, 03/03/2015, 06/29/2015, 07/09/2016, 03/03/2017, 07/24/2017, 02/05/2018, 06/23/2018, 06/08/2019, 02/18/2020, 06/08/2020   Influenza, Quadrivalent, Recombinant, Inj, Pf 03/04/2019   Influenza-Unspecified 01/24/2014, 02/23/2023   PFIZER Comirnaty(Gray Top)Covid-19 Tri-Sucrose Vaccine 02/25/2023   PFIZER(Purple Top)SARS-COV-2 Vaccination 06/16/2019, 07/07/2019, 02/18/2020, 06/08/2020   PNEUMOCOCCAL CONJUGATE-20 08/05/2022   Pneumococcal Polysaccharide-23 03/22/2015, 06/29/2015, 07/09/2016, 07/24/2017, 06/23/2018, 06/08/2019, 06/08/2020, 06/03/2021   RSV,unspecified 02/27/2023   Zoster Recombinant(Shingrix) 03/09/2017     Objective: Vital Signs: BP 120/83 (BP Location: Left Arm, Patient Position: Sitting, Cuff Size: Large)   Pulse (!) 108   Resp 14   Ht 5\' 2"  (1.575 m)   Wt 154 lb (69.9 kg)   BMI 28.17 kg/m    Physical Exam Cardiovascular:     Rate and Rhythm: Normal rate. Rhythm irregular.     Heart sounds: No murmur heard.    No friction rub. No gallop.  Pulmonary:     Effort: No respiratory distress.     Breath sounds: Examination of the left-middle field reveals decreased breath sounds. Examination of the left-lower field reveals decreased breath sounds. Decreased breath sounds present. No wheezing, rhonchi or rales.     Comments: Decreased tactile fremitus on the left mid and lower lobes Musculoskeletal:        General: Swelling and deformity present.     Right lower leg: Edema present.     Left lower leg: Edema present.  Skin:    General: Skin is warm and dry.     Capillary Refill: Capillary refill takes less than 2 seconds.     Findings:  Bruising present.  Neurological:     Mental Status: She is oriented to person, place, and time.     Musculoskeletal Exam:   Neck full ROM no tenderness Shoulders full ROM no tenderness or swelling Elbows full ROM no tenderness or swelling Wrists full ROM no tenderness or swelling Fingers full ROM no tenderness or swelling, hypertrophic MCPs No paraspinal tenderness to palpation over upper and lower back Hip normal internal and external rotation without pain, no tenderness to lateral hip palpation Knees full ROM no tenderness or swelling, crepitus on the right knee  Ankles full ROM no tenderness, edema 1+ bilaterally  Investigation: No additional findings.  Imaging: No results found.   Recent Labs: Lab Results  Component Value Date   WBC 6.4 07/05/2023   HGB 15.3 (H) 07/05/2023   PLT 217 07/05/2023   NA 137 07/05/2023   K 4.5 07/05/2023   CL 100 07/05/2023   CO2 28 07/05/2023   GLUCOSE 106 (H) 07/05/2023   BUN 10 07/05/2023   CREATININE 0.71 07/05/2023   BILITOT 0.8 05/05/2023   ALKPHOS 51 05/01/2022   AST 19 05/05/2023   ALT 20 05/05/2023   PROT 6.0 (L) 05/05/2023   ALBUMIN 3.7 05/01/2022   CALCIUM 8.9 07/05/2023   GFRAA 90 12/27/2018   QFTBGOLDPLUS Negative 11/28/2021    Speciality Comments: No specialty comments available.  Procedures:  No procedures performed Allergies: Erythromycin, Azathioprine, Sulfa antibiotics, Azithromycin, Condrolite [glucosamine-chondroitin-msm], Glucosamine, Glucosamine sulfate-msm, and Sulfamethoxazole-trimethoprim   Assessment / Plan:     Visit Diagnoses:   Rheumatoid arthritis involving multiple sites with positive rheumatoid factor (HCC)  Currently well controlled with methotrexate 12.5mg  subcutaneous injections weekly, and folic acid 1mg  daily. She is also on prednisone 5mg  daily. Tolerating this regimen well. Recently saw pulmonology and CXR showed a slighly decreased pleural effusion. No tender joints today.   -Cw  methotrexate 12.5mg  subcutaneous injections weekly  -cw folic  acid 1 mg daily  -cw prednisone 5mg  daily   High risk medication use - methotrexate 12.5 mg subcu weekly and folic acid 1 mg daily Long term (current) use of systemic steroids - prednisone 5 mg daily -COMPLETE METABOLIC PANEL WITH GFR, Sedimentation rate  Osteoporosis Has age related osteoporosis and is high risk for a pathologic fracture. She has recently started on Zolendronic acid and received her first infusion. Pt reports she tolerated the infusion well although she was anxious about the side effects.  -Cw zolendronic acid once a year   Peripheral edema Likely in the setting of venous stasis. Would benefit from leg elevation and compression stockings.   Orders: Orders Placed This Encounter  Procedures   CBC with Differential/Platelet   COMPLETE METABOLIC PANEL WITH GFR   Sedimentation rate   Meds ordered this encounter  Medications   folic acid (FOLVITE) 1 MG tablet    Sig: Take 1 tablet (1 mg total) by mouth daily.    Dispense:  90 tablet    Refill:  3   Follow-Up Instructions: Return in about 3 months (around 11/11/2023) for RA/ILD on MTX f/u 3mos.  Manuela Neptune, MD  Note - This record has been created using [employer].  Chart creation errors have been sought, but may not always  have been located. Such creation errors do not reflect on  the standard of medical care.

## 2023-08-12 LAB — COMPLETE METABOLIC PANEL WITH GFR
AG Ratio: 1.6 (calc) (ref 1.0–2.5)
ALT: 28 U/L (ref 6–29)
AST: 24 U/L (ref 10–35)
Albumin: 3.8 g/dL (ref 3.6–5.1)
Alkaline phosphatase (APISO): 72 U/L (ref 37–153)
BUN: 14 mg/dL (ref 7–25)
CO2: 30 mmol/L (ref 20–32)
Calcium: 9.3 mg/dL (ref 8.6–10.4)
Chloride: 98 mmol/L (ref 98–110)
Creat: 0.61 mg/dL (ref 0.60–0.95)
Globulin: 2.4 g/dL (ref 1.9–3.7)
Glucose, Bld: 87 mg/dL (ref 65–99)
Potassium: 4.2 mmol/L (ref 3.5–5.3)
Sodium: 135 mmol/L (ref 135–146)
Total Bilirubin: 0.7 mg/dL (ref 0.2–1.2)
Total Protein: 6.2 g/dL (ref 6.1–8.1)

## 2023-08-12 LAB — CBC WITH DIFFERENTIAL/PLATELET
Absolute Lymphocytes: 615 {cells}/uL — ABNORMAL LOW (ref 850–3900)
Absolute Monocytes: 495 {cells}/uL (ref 200–950)
Basophils Absolute: 38 {cells}/uL (ref 0–200)
Basophils Relative: 0.5 %
Eosinophils Absolute: 30 {cells}/uL (ref 15–500)
Eosinophils Relative: 0.4 %
HCT: 47.8 % — ABNORMAL HIGH (ref 35.0–45.0)
Hemoglobin: 16.1 g/dL — ABNORMAL HIGH (ref 11.7–15.5)
MCH: 31.2 pg (ref 27.0–33.0)
MCHC: 33.7 g/dL (ref 32.0–36.0)
MCV: 92.6 fL (ref 80.0–100.0)
MPV: 11 fL (ref 7.5–12.5)
Monocytes Relative: 6.6 %
Neutro Abs: 6323 {cells}/uL (ref 1500–7800)
Neutrophils Relative %: 84.3 %
Platelets: 221 10*3/uL (ref 140–400)
RBC: 5.16 10*6/uL — ABNORMAL HIGH (ref 3.80–5.10)
RDW: 14.4 % (ref 11.0–15.0)
Total Lymphocyte: 8.2 %
WBC: 7.5 10*3/uL (ref 3.8–10.8)

## 2023-08-12 LAB — SEDIMENTATION RATE: Sed Rate: 2 mm/h (ref 0–30)

## 2023-08-13 ENCOUNTER — Telehealth: Payer: Self-pay | Admitting: *Deleted

## 2023-08-13 NOTE — Telephone Encounter (Signed)
 I called patient, patient verbalized understanding, patient is still checking on pharmacy for syringes.

## 2023-08-13 NOTE — Progress Notes (Signed)
 Sed rate of 2 is normal. Her blood count shows a small increased in hemoglobin up to 16.1 Respiratory problems can sometimes cause a high hemoglobin if the blood is compensating for lower oxygen but this is a small amount and we can just monitor for now. Her kidney and liver function is normal.

## 2023-08-13 NOTE — Telephone Encounter (Signed)
 Patient called, patient is having blurred vision  x 2 days and fatigue. Could this be due to the medications and abnormal labs?

## 2023-08-13 NOTE — Telephone Encounter (Signed)
 I do not see any lab abnormality from our recent visit that would explain visual changes. I am not aware that we changed anything with medications.  Did she find out if a different pharmacy would work for her syringes?

## 2023-08-30 ENCOUNTER — Other Ambulatory Visit: Payer: Self-pay | Admitting: Interventional Cardiology

## 2023-09-25 ENCOUNTER — Telehealth: Payer: Self-pay

## 2023-09-25 ENCOUNTER — Other Ambulatory Visit: Payer: Self-pay | Admitting: Internal Medicine

## 2023-09-25 DIAGNOSIS — M0579 Rheumatoid arthritis with rheumatoid factor of multiple sites without organ or systems involvement: Secondary | ICD-10-CM

## 2023-09-25 NOTE — Telephone Encounter (Signed)
 Patient left a message stating her methotrexate  refill was denied. Denied the patient's refill this morning because a three month supply was sent in on 08/03/2023 and the patient should not need a refill until June. Attempted to contact the patient and left a message to call the office back.

## 2023-10-21 NOTE — Progress Notes (Deleted)
 Office Visit Note  Patient: Rebecca Short             Date of Birth: August 08, 1940           MRN: [id number]             PCP: Rebecca Mould, MD Referring: Rebecca Mould, MD Visit Date: 11/02/2023   Subjective:  No chief complaint on file.   History of Present Illness: Rebecca Short is a 83 y.o. 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 Is currently on methotrexate  12.5mg  [place] weekly and 1 mg of folic acid  daily.    Previous HPI 08/11/2023 Rebecca Short is a 83 y.o. 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 Is currently on 12.5mg  [place] weekly and 1 mg of folic acid  daily. She is also on prednisone  5mg  daily. Has not had any joint pain. She did suffer a mechanical fall in February. Went to the ED as she is on eliquis  for afib. Incidentally, it was discovered that she had a meningioma that had slightly grown in size, however she has been asymptomatic from that. She did hit her arm and scraped it, she also hit her hip. No fractures. Her hip pain has been improving.     She denies any worsening SOB, chest pain, or pleurisy. Recently saw pulmonology and has a slightly decreased pleural effusion on the left.    [name] is also wondering about the current size of her needles, as her pharmacy does not carry the ones she was prescribed and has longer ones.    Previous HPI Rebecca Short is  a 83 y.o. female here for follow up for seropositive RA primarily pleural effusion involvement on MTX 12.5 mg [place] weekly and folic acid  1 mg daily and prednisone  5 mg daily.     Previous HPI 05/05/2023 Rebecca Short is a 83 y.o. female here for follow up for seropositive RA primarily pleural effusion involvement on MTX 12.5 mg [place] weekly and folic acid  1 mg daily and prednisone  5 mg daily.  She presents with concerns about worsening bone density. They report a previous bone density measurement of -4, which improved  to -2.9 on most recent DEXA after two years of self-directed exercises. Despite this improvement, the patient acknowledges that their bone density remains in the osteoporosis range. They express frustration with the impact of prednisone  on their bone density, describing it as a constant battle.   The patient also reports edema in their ankles, which they monitor daily for changes in size. They have tried compression socks for relief, but found them uncomfortable, particularly around the toes. They express interest in trying open-toe compression socks as a potential solution.   In addition to these concerns, the patient mentions a history of sensitivity to various medications, including Fosamax and methotrexate , which they were reluctant to take. They express a similar hesitance towards starting new treatments for their osteoporosis, but seem open to considering an annual Reclast  infusion after discussing it with their physician.   The patient's overall health status appears to be stable, with no reported changes in symptoms or new health issues since their last visit. They deny experiencing any shortness of breath or worsening of their edema.    Previous HPI 02/03/2023 Rebecca Short is a 83 y.o. female here for follow up for seropositive RA primarily pleural effusion involvement on MTX 12.5 mg  [place] weekly and folic acid  1 mg daily and prednisone  5 mg daily.  Since our last visit symptoms are stable and doing pretty well from a functional standpoint.  She is working with physical therapy on her mobility and balance.  Feels her shortness of breath with movement and talking is partially improved.  Last x-ray with Dr. Diania Fortes in June with stable left pleural effusion possible improvement in right side.  Still has persistent bruising on extremities no new skin tears.   Previous HPI 11/03/2022 Rebecca Short is a 83 y.o. female here for follow up for seropositive RA on MTX 12.5 mg [place] weekly and folic acid  1 mg  daily and prednisone  5 mg daily.  Since our last visit she tapered prednisone  down to 5 mg daily which was tolerable but subsequent tapering to 2.5 mg caused worsening of symptoms with shortness of breath, decreased lung volume on at home incentive spirometry, and was found to have right lung effusion at pulmonology clinic evaluation.  She is now back to 5 mg daily.  Also had some adjustment of Lasix  dosing for pedal edema with elevated BNP is currently taking 20 mg daily.   Previous HPI 08/04/22 Rebecca Short is a 83 y.o. female here for follow up for seropositive RA association with serositis and persistent left pleural effusion on methotrexate  15 mg subcu weekly and folic acid  1 mg daily and prednisone  5 mg daily.  Since switching from oral to subcu methotrexate  she had a great improvement in medication tolerance no longer feeling nauseated and extremely fatigued for 2 to 3 days after each dose.  Right shoulder pain is doing much better after the steroid injection in January as well.  She continued prednisone  tapering with close pulmonology follow-up down to 5 mg daily now and has not noticed any significant worsening of symptoms.  She is interested in getting off the remaining steroids also interested in getting updated vaccines to feel safer with more social contacts.   Previous HPI 06/04/22 Rebecca Short is a 83 y.o. female here for follow up for rheumatoid pleural effusion on MTX 15 mg PO weekly and folic acid  1 mg daily and prednisone  7.5 mg daily. She had some exacerbation with edema and dyspnea but stable pleural effusion on imaging. Labs with slightly worsening of lymphopenia last month as well.  She is noticing what she thinks is a bit more nausea and fatigue type side effects after the methotrexate  weekly dose than before taking up to a few days to fully recover rather than less than 1.  Since tapering the steroid dose down to 7.5 mg about a week ago she is experiencing increased in shoulder pain  and stiffness on both sides.     Previous HPI 03/03/22 Rebecca Short is a 83 y.o. female here for follow up for rheumatoid pleural effusion  on MTX 15 mg PO weekly and folic acid  1 mg daily and prednisone  15 mg daily. So far her respiratory symptoms are doing well since last visit. Her mood and energy level are very poor, and frustrated about inability to socialize and limited exertion tolerance. She reports alternating days of more and less energy, sometimes associated with the methotrexate  doses on Mondays but not always. Also having some concentration and memory difficulties increased from before for a few months.   Previous HPI 12/30/2021 Rebecca Short is a 83 y.o. female here for follow up rheumatoid pleural effusion on methotrexate  6 tablets once weekly, folic acid  1 mg  daily. She was hospitalized last month with recurrent pleural effusion 7/4-7/7 requiring repeat thoracocentesis. Since discharge she is back onto higher dose prednisone  and symptoms are doing okay. We stopped her azathioprine  due to apparent treatment failure and she started methotrexate  since 7/31 after negative hepatitis screening. She has not seen any problems with GI intolerance. She does feel somewhat unsteady or lightheaded and having to move slowly on account of this.   Previous HPI 09/09/2021 Rebecca Short is a 83 y.o. female here for follow up for rheumatoid pleural effusion on azathioprine  100 mg daily and prednisone  taper and atovaquone  ppx. She is doing well since last visit without major events. She has some mild GI irritation with the azathioprine . She has significant bruising on her hands and forearms. She saw Dr. Diania Fortes for follow up with stable pulmonary symptoms and repeat chest xray obtained 3/28 was also stable. She is tapering the prednisone  of a rate about 5 mg/day dose per month currently down to 20 mg daily. She is curious about safety going to the dentist for procedures on her medications.   Previous  HPI 07/09/21 Rebecca Short is a 83 y.o. female here for follow up for rheumatoid pleural effusions after starting azathioprine  50 mg last month, continuing prednisone  40 mg daily and prophylactic atovaquone . She went for repeat chest xray with Dr. Diania Fortes that looked stable 2 weeks ago. So far she denies any major side effects after starting imuran .   Previous HPI 05/08/21 ZHURI KRASS is a 83 y.o. female here for evaluation with recurrent pleural effusions and dyspnea with positive RF and ANCA Abs. She was hospitalized in August after progressively worsening dyspnea and hypotension with exudative pleural fluid removed from left lung space followed by pericardial drainage and initial treatment with colchicine . She subsequently underwent additional thoracentesis drainage of effusions later in August and beginning of September. Results showed neutrophil predominant fluid. Prednisone  40 mg daily was added to treatment with stable condition. This continued for a month but tapering the prednisone  resulted in more rapid re accumulation of fluid on a dose of about 20 gm daily or less. Imaging demonstrated progressive increase again in left sided effusions and repeat drainage again on 11/14 and 11/30 when down to 10 mg and 5 mg daily doses of prednisone  along with colchicine . She is now continuing prednisone  40 mg with atovaquone  prophylaxis and colchicine  0.6 mg daily. In addition to pulmonary symptoms she reports bilateral shoulder pain and stiffness developing around the initial onset of her dyspnea before hospitalization in August.  This improved completely on 40 mg prednisone  but she noticed definite recurrence of shoulder pain symptoms when tapering down the prednisone  to 10 or 5 mg/day dose.  No active complaints today since she is back on higher dose medication.  She denies any peripheral joint pain involvement did not notice any significant swelling or numbness in her upper extremities associated with the  pain.   No Rheumatology ROS completed.   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   Pleural effusion 11/26/2021   Serositis (HCC) 09/27/2021   Fatigue 09/27/2021   Macular pucker, left eye 06/18/2021   High risk medication use 05/30/2021   Bilateral shoulder pain 05/08/2021   Vitamin D  deficiency 05/08/2021   Abnormal ANCA test 05/08/2021   RA (rheumatoid arthritis) (HCC) 04/22/2021   S/P thoracentesis    Arterial hypotension  Pleural effusion on left    Pericardial effusion    Leukocytosis 01/03/2021   Community acquired pneumonia 01/03/2021   Atrial fibrillation (HCC) 12/24/2020   Atrial fibrillation with RVR (HCC) 12/23/2020   Musculoskeletal pain 12/23/2020   Posterior vitreous detachment of both eyes 04/30/2020   Macular pucker, right eye 11/08/2019   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HCC) 11/08/2019   Intermediate stage nonexudative age-related macular degeneration of both eyes 11/08/2019   Anticoagulated 11/18/2016   Bradycardia 03/23/2014   OSA (obstructive sleep apnea) 01/19/2014   Obesity (BMI 30-39.9) 01/19/2014   Paroxysmal A-fib (HCC) 10/04/2013   Nonspecific abnormal results of cardiovascular function study 07/21/2013   Acute gastroenteritis 07/15/2013    Past Medical History:  Diagnosis Date   A-fib [facility])    Allergy    Atrial fibrillation (HCC)    FH: cholecystectomy 1997   Macular degeneration    OSA (obstructive sleep apnea)    mild with total AHI 7.25/hr and severe during REM sleep at 35/hr now on CPAP at 6cm H2O    Family History  Problem Relation Age of Onset   Osteoporosis Mother    Macular degeneration Mother    Heart Problems Father    Stroke Father    Diabetes Brother    Kidney failure Brother    Cancer Maternal Grandmother    Dementia Paternal  Grandmother    Suicidality Other    Heart attack Neg Hx    Past Surgical History:  Procedure Laterality Date   CHEST TUBE INSERTION Left 11/27/2021   Procedure: CHEST TUBE INSERTION;  Surgeon: Wilfredo Hanly, MD;  Location: [facility] ENDOSCOPY;  Service: Pulmonary;  Laterality: Left;  Prefer AM slot, thanks   CHOLECYSTECTOMY     PERICARDIOCENTESIS N/A 01/07/2021   Procedure: PERICARDIOCENTESIS;  Surgeon: Sammy Crisp, MD;  Location: [facility] INVASIVE CV LAB;  Service: Cardiovascular;  Laterality: N/A;   REPLACEMENT TOTAL KNEE BILATERAL     THORACENTESIS N/A 01/25/2021   Procedure: THORACENTESIS;  Surgeon: Wilfredo Hanly, MD;  Location: [facility] ENDOSCOPY;  Service: Pulmonary;  Laterality: N/A;   THORACENTESIS N/A 04/08/2021   Procedure: THORACENTESIS;  Surgeon: Wilfredo Hanly, MD;  Location: [facility] ENDOSCOPY;  Service: Pulmonary;  Laterality: N/A;   THORACENTESIS N/A 04/24/2021   Procedure: THORACENTESIS;  Surgeon: Marine Sia, MD;  Location: [facility] ENDOSCOPY;  Service: Cardiopulmonary;  Laterality: N/A;   Social History   Social History Narrative   ** Merged History Encounter **       Immunization History  Administered Date(s) Administered   Influenza, High Dose Seasonal PF 07/05/2014, 03/03/2015, 06/29/2015, 07/09/2016, 03/03/2017, 07/24/2017, 02/05/2018, 06/23/2018, 06/08/2019, 02/18/2020, 06/08/2020   Influenza, Quadrivalent, Recombinant, Inj, Pf 03/04/2019   Influenza-Unspecified 01/24/2014, 02/23/2023   PFIZER Comirnaty(Gray Top)Covid-19 Tri-Sucrose Vaccine 02/25/2023   PFIZER(Purple Top)SARS-COV-2 Vaccination 06/16/2019, 07/07/2019, 02/18/2020, 06/08/2020   PNEUMOCOCCAL CONJUGATE-20 08/05/2022   Pneumococcal Polysaccharide-23 03/22/2015, 06/29/2015, 07/09/2016, 07/24/2017, 06/23/2018, 06/08/2019, 06/08/2020, 06/03/2021   RSV,unspecified 02/27/2023   Zoster Recombinant(Shingrix) 03/09/2017     Objective: Vital Signs: There were no vitals taken for this visit.   Physical Exam    Musculoskeletal Exam: ***  CDAI Exam: CDAI Score: -- Patient Global: --; Provider Global: -- Swollen: --; Tender: -- Joint Exam 11/02/2023   No joint exam has been documented for this visit   There is currently no information documented on the homunculus. Go to the Rheumatology activity and complete the homunculus joint exam.  Investigation: No additional findings.  Imaging: No results found.  Recent Labs: Lab Results  Component Value Date   WBC 7.5 08/11/2023   HGB 16.1 (H) 08/11/2023   PLT 221 08/11/2023   NA 135 08/11/2023   K 4.2 08/11/2023   CL 98 08/11/2023   CO2 30 08/11/2023   GLUCOSE 87 08/11/2023   BUN 14 08/11/2023   CREATININE 0.61 08/11/2023   BILITOT 0.7 08/11/2023   ALKPHOS 51 05/01/2022   AST 24 08/11/2023   ALT 28 08/11/2023   PROT 6.2 08/11/2023   ALBUMIN 3.7 05/01/2022   CALCIUM  9.3 08/11/2023   GFRAA 90 12/27/2018   QFTBGOLDPLUS Negative 11/28/2021    Speciality Comments: No specialty comments available.  Procedures:  No procedures performed Allergies: Erythromycin, Azathioprine , Sulfa  antibiotics, Azithromycin , Condrolite [glucosamine-chondroitin-msm], Glucosamine, Glucosamine sulfate-msm, and Sulfamethoxazole -trimethoprim    Assessment / Plan:     Visit Diagnoses: No diagnosis found.  ***  Orders: No orders of the defined types were placed in this encounter.  No orders of the defined types were placed in this encounter.    Follow-Up Instructions: No follow-ups on file.   Glena Landau, RT  Note - This record has been created using [employer].  Chart creation errors have been sought, but may not always  have been located. Such creation errors do not reflect on  the standard of medical care.

## 2023-10-26 ENCOUNTER — Other Ambulatory Visit: Payer: Self-pay | Admitting: Internal Medicine

## 2023-10-26 ENCOUNTER — Telehealth: Payer: Self-pay | Admitting: Internal Medicine

## 2023-10-26 ENCOUNTER — Other Ambulatory Visit: Payer: Self-pay | Admitting: Pulmonary Disease

## 2023-10-26 DIAGNOSIS — J90 Pleural effusion, not elsewhere classified: Secondary | ICD-10-CM

## 2023-10-26 DIAGNOSIS — M0579 Rheumatoid arthritis with rheumatoid factor of multiple sites without organ or systems involvement: Secondary | ICD-10-CM

## 2023-10-26 NOTE — Telephone Encounter (Addendum)
 Last Fill: 08/03/2023  Labs: 08/11/2023 RBC 5.16 Hemoglobin 16.1 Absolute  Lymphocytes 615  Next Visit: 11/02/2023  Last Visit: 08/11/2023  DX: Rheumatoid arthritis involving multiple sites with positive rheumatoid factor   Current Dose per office note 08/11/2023: methotrexate  12.5 mg subcu weekly   Okay to refill Methotrexate ?

## 2023-10-26 NOTE — Telephone Encounter (Signed)
 Refill pending in Dr. Gregary Cromer box.

## 2023-10-26 NOTE — Telephone Encounter (Signed)
 Patient contacted the office to request a medication refill.   1. Name of Medication: Methotrexate   2. How are you currently taking this medication (dosage and times per day)? 0.5 MLS / once a week   3. What pharmacy would you like for that to be sent to? [facility]   Patient has one injection remaining.

## 2023-11-02 ENCOUNTER — Other Ambulatory Visit: Payer: Self-pay

## 2023-11-02 ENCOUNTER — Ambulatory Visit: Admitting: Internal Medicine

## 2023-11-02 DIAGNOSIS — Z79899 Other long term (current) drug therapy: Secondary | ICD-10-CM

## 2023-11-02 DIAGNOSIS — R600 Localized edema: Secondary | ICD-10-CM

## 2023-11-02 DIAGNOSIS — M0579 Rheumatoid arthritis with rheumatoid factor of multiple sites without organ or systems involvement: Secondary | ICD-10-CM

## 2023-11-02 DIAGNOSIS — Z7952 Long term (current) use of systemic steroids: Secondary | ICD-10-CM

## 2023-11-02 DIAGNOSIS — M810 Age-related osteoporosis without current pathological fracture: Secondary | ICD-10-CM

## 2023-11-02 NOTE — Progress Notes (Unsigned)
 Patient contacted the office and states she knows she will be due for labs soon and is planning to come into the office next week to get lab work done. Future orders placed.

## 2023-11-03 ENCOUNTER — Encounter: Payer: Self-pay | Admitting: Internal Medicine

## 2023-11-03 ENCOUNTER — Ambulatory Visit: Attending: Internal Medicine | Admitting: Internal Medicine

## 2023-11-03 VITALS — BP 116/82 | HR 84 | Resp 14 | Ht 62.0 in | Wt 159.0 lb

## 2023-11-03 DIAGNOSIS — Z79899 Other long term (current) drug therapy: Secondary | ICD-10-CM | POA: Insufficient documentation

## 2023-11-03 DIAGNOSIS — J90 Pleural effusion, not elsewhere classified: Secondary | ICD-10-CM | POA: Insufficient documentation

## 2023-11-03 DIAGNOSIS — M059 Rheumatoid arthritis with rheumatoid factor, unspecified: Secondary | ICD-10-CM | POA: Diagnosis present

## 2023-11-03 DIAGNOSIS — D582 Other hemoglobinopathies: Secondary | ICD-10-CM | POA: Insufficient documentation

## 2023-11-03 DIAGNOSIS — D72829 Elevated white blood cell count, unspecified: Secondary | ICD-10-CM | POA: Insufficient documentation

## 2023-11-03 LAB — COMPREHENSIVE METABOLIC PANEL WITH GFR
AG Ratio: 1.9 (calc) (ref 1.0–2.5)
ALT: 20 U/L (ref 6–29)
AST: 19 U/L (ref 10–35)
Albumin: 3.7 g/dL (ref 3.6–5.1)
Alkaline phosphatase (APISO): 65 U/L (ref 37–153)
BUN: 13 mg/dL (ref 7–25)
CO2: 31 mmol/L (ref 20–32)
Calcium: 9.2 mg/dL (ref 8.6–10.4)
Chloride: 98 mmol/L (ref 98–110)
Creat: 0.68 mg/dL (ref 0.60–0.95)
Globulin: 2 g/dL (ref 1.9–3.7)
Glucose, Bld: 127 mg/dL — ABNORMAL HIGH (ref 65–99)
Potassium: 4.4 mmol/L (ref 3.5–5.3)
Sodium: 135 mmol/L (ref 135–146)
Total Bilirubin: 0.6 mg/dL (ref 0.2–1.2)
Total Protein: 5.7 g/dL — ABNORMAL LOW (ref 6.1–8.1)
eGFR: 86 mL/min/{1.73_m2} (ref >=60)

## 2023-11-03 LAB — CBC WITH DIFFERENTIAL/PLATELET
Absolute Lymphocytes: 576 {cells}/uL — ABNORMAL LOW (ref 850–3900)
Absolute Monocytes: 354 {cells}/uL (ref 200–950)
Basophils Absolute: 30 {cells}/uL (ref 0–200)
Basophils Relative: 0.5 %
Eosinophils Absolute: 12 {cells}/uL — ABNORMAL LOW (ref 15–500)
Eosinophils Relative: 0.2 %
HCT: 46.7 % — ABNORMAL HIGH (ref 35.0–45.0)
Hemoglobin: 15.3 g/dL (ref 11.7–15.5)
MCH: 31.1 pg (ref 27.0–33.0)
MCHC: 32.8 g/dL (ref 32.0–36.0)
MCV: 94.9 fL (ref 80.0–100.0)
MPV: 11 fL (ref 7.5–12.5)
Monocytes Relative: 5.9 %
Neutro Abs: 5028 {cells}/uL (ref 1500–7800)
Neutrophils Relative %: 83.8 %
Platelets: 226 10*3/uL (ref 140–400)
RBC: 4.92 10*6/uL (ref 3.80–5.10)
RDW: 14.4 % (ref 11.0–15.0)
Total Lymphocyte: 9.6 %
WBC: 6 10*3/uL (ref 3.8–10.8)

## 2023-11-03 LAB — SEDIMENTATION RATE: Sed Rate: 2 mm/h (ref 0–30)

## 2023-11-03 NOTE — Progress Notes (Signed)
 Office Visit Note  Patient: Rebecca Short             Date of Birth: 01-12-1941           MRN: [id number]             PCP: Okey Carlin Redbird, MD Referring: Okey Carlin Redbird, MD Visit Date: 11/03/2023   Subjective:  Follow-up (Patient states her hemoglobin and HCT have been going up. )   Discussed the use of AI scribe software for clinical note transcription with the patient, who gave verbal consent to proceed.  History of Present Illness   RENESSA Short is a 83 y.o. 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 Is currently on methotrexate  12.5mg  [place] weekly and 1 mg of folic acid  daily.    She has observed a recent increase in her hemoglobin and hematocrit levels since March and is concerned about the implications. She uses a CPAP machine nightly for sleep apnea, which was diagnosed after experiencing symptoms such as waking up with a snort. She regularly monitors her blood pressure, oxygen, and heart rate, noting no significant changes during exercise.  She has a history of osteoarthritis, which causes joint aches, particularly with weather changes. After a fall in February, she has been cautious about physical activity due to her use of Eliquis . She has resumed some exercise, including using a treadmill and weight machines, but remains careful to avoid further falls.  She recently resolved issues obtaining syringes and compression socks, which are now working well for her. She also started Reclast  in December for bone density management.  No new symptoms have been experienced aside from joint aches and concerns about hemoglobin levels.     Previous HPI 08/11/2023 Rebecca Short is a 83 y.o. 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 Is currently on 12.5mg  [place] weekly and 1 mg of folic acid  daily. She is also on prednisone  5mg  daily. Has not had any joint pain. She did suffer a mechanical fall in  February. Went to the ED as she is on eliquis  for afib. Incidentally, it was discovered that she had a meningioma that had slightly grown in size, however she has been asymptomatic from that. She did hit her arm and scraped it, she also hit her hip. No fractures. Her hip pain has been improving.     She denies any worsening SOB, chest pain, or pleurisy. Recently saw pulmonology and has a slightly decreased pleural effusion on the left.    Ms. Locastro is also wondering about the current size of her needles, as her pharmacy does not carry the ones she was prescribed and has longer ones.    Previous HPI Rebecca Short is  a 83 y.o. female here for follow up for seropositive RA primarily pleural effusion involvement on MTX 12.5 mg [place] weekly and folic acid  1 mg daily and prednisone  5 mg daily.     Previous HPI 05/05/2023 Rebecca Short is a 83 y.o. female here for follow up for seropositive RA primarily pleural effusion involvement on MTX 12.5 mg [place] weekly and folic acid  1 mg daily and prednisone  5 mg daily.  She presents with concerns about worsening bone density. They report a previous bone density measurement of -4, which improved to -2.9 on most recent DEXA after two years of self-directed exercises. Despite this improvement, the patient acknowledges that their bone density remains  in the osteoporosis range. They express frustration with the impact of prednisone  on their bone density, describing it as a constant battle.   The patient also reports edema in their ankles, which they monitor daily for changes in size. They have tried compression socks for relief, but found them uncomfortable, particularly around the toes. They express interest in trying open-toe compression socks as a potential solution.   In addition to these concerns, the patient mentions a history of sensitivity to various medications, including Fosamax and methotrexate , which they were reluctant to take. They express a similar hesitance  towards starting new treatments for their osteoporosis, but seem open to considering an annual Reclast  infusion after discussing it with their physician.   The patient's overall health status appears to be stable, with no reported changes in symptoms or new health issues since their last visit. They deny experiencing any shortness of breath or worsening of their edema.    Previous HPI 02/03/2023 LATAMARA Short is a 83 y.o. female here for follow up for seropositive RA primarily pleural effusion involvement on MTX 12.5 mg [place] weekly and folic acid  1 mg daily and prednisone  5 mg daily.  Since our last visit symptoms are stable and doing pretty well from a functional standpoint.  She is working with physical therapy on her mobility and balance.  Feels her shortness of breath with movement and talking is partially improved.  Last x-ray with Dr. Kara in June with stable left pleural effusion possible improvement in right side.  Still has persistent bruising on extremities no new skin tears.   Previous HPI 11/03/2022 Rebecca Short is a 83 y.o. female here for follow up for seropositive RA on MTX 12.5 mg [place] weekly and folic acid  1 mg daily and prednisone  5 mg daily.  Since our last visit she tapered prednisone  down to 5 mg daily which was tolerable but subsequent tapering to 2.5 mg caused worsening of symptoms with shortness of breath, decreased lung volume on at home incentive spirometry, and was found to have right lung effusion at pulmonology clinic evaluation.  She is now back to 5 mg daily.  Also had some adjustment of Lasix  dosing for pedal edema with elevated BNP is currently taking 20 mg daily.   Previous HPI 08/04/22 Rebecca Short is a 83 y.o. female here for follow up for seropositive RA association with serositis and persistent left pleural effusion on methotrexate  15 mg subcu weekly and folic acid  1 mg daily and prednisone  5 mg daily.  Since switching from oral to subcu methotrexate  she had a great  improvement in medication tolerance no longer feeling nauseated and extremely fatigued for 2 to 3 days after each dose.  Right shoulder pain is doing much better after the steroid injection in January as well.  She continued prednisone  tapering with close pulmonology follow-up down to 5 mg daily now and has not noticed any significant worsening of symptoms.  She is interested in getting off the remaining steroids also interested in getting updated vaccines to feel safer with more social contacts.   Previous HPI 06/04/22 KAYLENE DAWN is a 83 y.o. female here for follow up for rheumatoid pleural effusion on MTX 15 mg PO weekly and folic acid  1 mg daily and prednisone  7.5 mg daily. She had some exacerbation with edema and dyspnea but stable pleural effusion on imaging. Labs with slightly worsening of lymphopenia last month as well.  She is noticing what she thinks is a bit more  nausea and fatigue type side effects after the methotrexate  weekly dose than before taking up to a few days to fully recover rather than less than 1.  Since tapering the steroid dose down to 7.5 mg about a week ago she is experiencing increased in shoulder pain and stiffness on both sides.     Previous HPI 03/03/22 YESENIA LOCURTO is a 83 y.o. female here for follow up for rheumatoid pleural effusion  on MTX 15 mg PO weekly and folic acid  1 mg daily and prednisone  15 mg daily. So far her respiratory symptoms are doing well since last visit. Her mood and energy level are very poor, and frustrated about inability to socialize and limited exertion tolerance. She reports alternating days of more and less energy, sometimes associated with the methotrexate  doses on Mondays but not always. Also having some concentration and memory difficulties increased from before for a few months.   Previous HPI 12/30/2021 CHRISTEL BAI is a 83 y.o. female here for follow up rheumatoid pleural effusion on methotrexate  6 tablets once weekly, folic acid  1 mg  daily. She was hospitalized last month with recurrent pleural effusion 7/4-7/7 requiring repeat thoracocentesis. Since discharge she is back onto higher dose prednisone  and symptoms are doing okay. We stopped her azathioprine  due to apparent treatment failure and she started methotrexate  since 7/31 after negative hepatitis screening. She has not seen any problems with GI intolerance. She does feel somewhat unsteady or lightheaded and having to move slowly on account of this.   Previous HPI 09/09/2021 EMMY KENG is a 83 y.o. female here for follow up for rheumatoid pleural effusion on azathioprine  100 mg daily and prednisone  taper and atovaquone  ppx. She is doing well since last visit without major events. She has some mild GI irritation with the azathioprine . She has significant bruising on her hands and forearms. She saw Dr. Kara for follow up with stable pulmonary symptoms and repeat chest xray obtained 3/28 was also stable. She is tapering the prednisone  of a rate about 5 mg/day dose per month currently down to 20 mg daily. She is curious about safety going to the dentist for procedures on her medications.   Previous HPI 07/09/21 LACOLE KOMOROWSKI is a 83 y.o. female here for follow up for rheumatoid pleural effusions after starting azathioprine  50 mg last month, continuing prednisone  40 mg daily and prophylactic atovaquone . She went for repeat chest xray with Dr. Kara that looked stable 2 weeks ago. So far she denies any major side effects after starting imuran .   Previous HPI 05/08/21 OMEGA DURANTE is a 83 y.o. female here for evaluation with recurrent pleural effusions and dyspnea with positive RF and ANCA Abs. She was hospitalized in August after progressively worsening dyspnea and hypotension with exudative pleural fluid removed from left lung space followed by pericardial drainage and initial treatment with colchicine . She subsequently underwent additional thoracentesis drainage of effusions  later in August and beginning of September. Results showed neutrophil predominant fluid. Prednisone  40 mg daily was added to treatment with stable condition. This continued for a month but tapering the prednisone  resulted in more rapid re accumulation of fluid on a dose of about 20 gm daily or less. Imaging demonstrated progressive increase again in left sided effusions and repeat drainage again on 11/14 and 11/30 when down to 10 mg and 5 mg daily doses of prednisone  along with colchicine . She is now continuing prednisone  40 mg with atovaquone  prophylaxis and colchicine  0.6 mg daily. In  addition to pulmonary symptoms she reports bilateral shoulder pain and stiffness developing around the initial onset of her dyspnea before hospitalization in August.  This improved completely on 40 mg prednisone  but she noticed definite recurrence of shoulder pain symptoms when tapering down the prednisone  to 10 or 5 mg/day dose.  No active complaints today since she is back on higher dose medication.  She denies any peripheral joint pain involvement did not notice any significant swelling or numbness in her upper extremities associated with the pain.   Review of Systems  Constitutional:  Negative for fatigue.  HENT:  Negative for mouth sores and mouth dryness.   Eyes:  Positive for dryness.  Respiratory:  Negative for shortness of breath.   Cardiovascular:  Negative for chest pain and palpitations.  Gastrointestinal:  Negative for blood in stool, constipation and diarrhea.  Endocrine: Negative for increased urination.  Genitourinary:  Positive for involuntary urination.  Musculoskeletal:  Positive for joint pain and joint pain. Negative for gait problem, joint swelling, myalgias, muscle weakness, morning stiffness, muscle tenderness and myalgias.  Skin:  Positive for sensitivity to sunlight. Negative for color change, rash and hair loss.  Allergic/Immunologic: Negative for susceptible to infections.  Neurological:   Negative for dizziness and headaches.  Hematological:  Negative for swollen glands.  Psychiatric/Behavioral:  Negative for depressed mood and sleep disturbance. The patient is not nervous/anxious.     PMFS History:  Patient Active Problem List   Diagnosis Date Noted   Elevated hemoglobin (HCC) 11/03/2023   Age related osteoporosis 05/08/2023   Macular pigment epithelial detachment, right 02/17/2022   Pleural effusion 11/26/2021   Serositis (HCC) 09/27/2021   Fatigue 09/27/2021   Macular pucker, left eye 06/18/2021   High risk medication use 05/30/2021   Bilateral shoulder pain 05/08/2021   Vitamin D  deficiency 05/08/2021   Abnormal ANCA test 05/08/2021   RA (rheumatoid arthritis) (HCC) 04/22/2021   S/P thoracentesis    Arterial hypotension    Pleural effusion on left    Pericardial effusion    Leukocytosis 01/03/2021   Community acquired pneumonia 01/03/2021   Atrial fibrillation (HCC) 12/24/2020   Atrial fibrillation with RVR (HCC) 12/23/2020   Musculoskeletal pain 12/23/2020   Posterior vitreous detachment of both eyes 04/30/2020   Macular pucker, right eye 11/08/2019   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HCC) 11/08/2019   Intermediate stage nonexudative age-related macular degeneration of both eyes 11/08/2019   Anticoagulated 11/18/2016   Bradycardia 03/23/2014   OSA (obstructive sleep apnea) 01/19/2014   Obesity (BMI 30-39.9) 01/19/2014   Paroxysmal A-fib (HCC) 10/04/2013   Nonspecific abnormal results of cardiovascular function study 07/21/2013   Acute gastroenteritis 07/15/2013    Past Medical History:  Diagnosis Date   A-fib [facility])    Allergy    Atrial fibrillation (HCC)    FH: cholecystectomy 1997   Macular degeneration    OSA (obstructive sleep apnea)    mild with total AHI 7.25/hr and severe during REM sleep at 35/hr now on CPAP at 6cm H2O    Family History  Problem Relation Age of Onset    Osteoporosis Mother    Macular degeneration Mother    Heart Problems Father    Stroke Father    Diabetes Brother    Kidney failure Brother    Cancer Maternal Grandmother    Dementia Paternal Grandmother    Suicidality Other    Heart attack Neg Hx    Past Surgical History:  Procedure Laterality  Date   CHEST TUBE INSERTION Left 11/27/2021   Procedure: CHEST TUBE INSERTION;  Surgeon: Kara Dorn NOVAK, MD;  Location[facility] ENDOSCOPY;  Service: Pulmonary;  Laterality: Left;  Prefer AM slot, thanks   CHOLECYSTECTOMY     PERICARDIOCENTESIS N/A 01/07/2021   Procedure: PERICARDIOCENTESIS;  Surgeon: Mady Bruckner, MD;  Location: [facility] INVASIVE CV LAB;  Service: Cardiovascular;  Laterality: N/A;   REPLACEMENT TOTAL KNEE BILATERAL     THORACENTESIS N/A 01/25/2021   Procedure: THORACENTESIS;  Surgeon: Kara Dorn NOVAK, MD;  Location: [facility] ENDOSCOPY;  Service: Pulmonary;  Laterality: N/A;   THORACENTESIS N/A 04/08/2021   Procedure: THORACENTESIS;  Surgeon: Kara Dorn NOVAK, MD;  Location: [facility] ENDOSCOPY;  Service: Pulmonary;  Laterality: N/A;   THORACENTESIS N/A 04/24/2021   Procedure: THORACENTESIS;  Surgeon: Alaine Vicenta NOVAK, MD;  Location: [facility] ENDOSCOPY;  Service: Cardiopulmonary;  Laterality: N/A;   Social History   Social History Narrative   ** Merged History Encounter **       Immunization History  Administered Date(s) Administered   Influenza, High Dose Seasonal PF 07/05/2014, 03/03/2015, 06/29/2015, 07/09/2016, 03/03/2017, 07/24/2017, 02/05/2018, 06/23/2018, 06/08/2019, 02/18/2020, 06/08/2020   Influenza, Quadrivalent, Recombinant, Inj, Pf 03/04/2019   Influenza-Unspecified 01/24/2014, 02/23/2023   PFIZER Comirnaty(Gray Top)Covid-19 Tri-Sucrose Vaccine 02/25/2023   PFIZER(Purple Top)SARS-COV-2 Vaccination 06/16/2019, 07/07/2019, 02/18/2020, 06/08/2020   PNEUMOCOCCAL CONJUGATE-20 08/05/2022   Pneumococcal Polysaccharide-23 03/22/2015, 06/29/2015, 07/09/2016, 07/24/2017, 06/23/2018,  06/08/2019, 06/08/2020, 06/03/2021   RSV,unspecified 02/27/2023   Zoster Recombinant(Shingrix) 03/09/2017     Objective: Vital Signs: BP 116/82 (BP Location: Left Arm, Patient Position: Sitting, Cuff Size: Normal)   Pulse 84   Resp 14   Ht 5' 2 (1.575 m)   Wt 159 lb (72.1 kg)   BMI 29.08 kg/m    Physical Exam  Eyes:     Conjunctiva/sclera: Conjunctivae normal.    Cardiovascular:     Rate and Rhythm: Normal rate and regular rhythm.  Pulmonary:     Effort: Pulmonary effort is normal.     Comments: Partly decreased breath sounds on left lower field Lymphadenopathy:     Cervical: No cervical adenopathy.   Skin:    General: Skin is warm and dry.     Findings: Bruising present.   Neurological:     Mental Status: She is alert.   Psychiatric:        Mood and Affect: Mood normal.      Musculoskeletal Exam:  Shoulders full ROM no tenderness or swelling Elbows full ROM no tenderness or swelling Wrists full ROM no tenderness or swelling Fingers full ROM no tenderness or swelling, heberdon's nodes b/l no palpable swelling or tenderness Knees full ROM no tenderness or swelling, bilateral patellofemoral crepitus Ankles full ROM no tenderness or swelling   Investigation: No additional findings.  Imaging: No results found.  Recent Labs: Lab Results  Component Value Date   WBC 6.0 11/03/2023   HGB 15.3 11/03/2023   PLT 226 11/03/2023   NA 135 11/03/2023   K 4.4 11/03/2023   CL 98 11/03/2023   CO2 31 11/03/2023   GLUCOSE 127 (H) 11/03/2023   BUN 13 11/03/2023   CREATININE 0.68 11/03/2023   BILITOT 0.6 11/03/2023   ALKPHOS 51 05/01/2022   AST 19 11/03/2023   ALT 20 11/03/2023   PROT 5.7 (L) 11/03/2023   ALBUMIN 3.7 05/01/2022   CALCIUM  9.2 11/03/2023   GFRAA 90 12/27/2018   QFTBGOLDPLUS Negative 11/28/2021    Speciality Comments: No specialty comments available.  Procedures:  No procedures performed  Allergies: Erythromycin, Azathioprine , Sulfa   antibiotics, Azithromycin , Condrolite [glucosamine-chondroitin-msm], Glucosamine, Glucosamine sulfate-msm, and Sulfamethoxazole -trimethoprim    Assessment / Plan:     Visit Diagnoses: Rheumatoid arthritis with positive rheumatoid factor, involving unspecified site (HCC) - Plan: Sedimentation rate Currently well controlled with methotrexate  12.5mg  subcutaneous injections weekly, and folic acid  1mg  daily. She is also on prednisone  5mg  daily. Tolerating this regimen well.  No tender joints today.   -Continue methotrexate  17.5mg  subcutaneous injections weekly and folic acid  1 mg daily - Reordering syringes w/ [facility] - Continue prednisone  5mg  daily   Pleural effusion - Plan: CBC with Differential/Platelet, Comprehensive metabolic panel with GFR High risk medication use I discussed findings of high hematocrit would not be typical for methotrexate  toxicity.  Has not suffered serious interval infections or other medication intolerance. - Rechecking blood count and metabolic panel for medication monitoring continue long-term use of methotrexate   Osteoarthritis Osteoarthritis with joint aches and stiffness, exacerbated by weather changes. Cautious due to Eliquis , which increases bleeding risk with falls. - Encourage gradual increase in physical activity as tolerated. - Continue Reclast  for bone density.   Orders: Orders Placed This Encounter  Procedures   Sedimentation rate   CBC with Differential/Platelet   Comprehensive metabolic panel with GFR   No orders of the defined types were placed in this encounter.    Follow-Up Instructions: Return in about 3 months (around 02/03/2024) for RA on MTX f/u 3mos.   Lonni LELON Ester, MD  Note - This record has been created using [employer].  Chart creation errors have been sought, but may not always  have been located. Such creation errors do not reflect on  the standard of medical care.

## 2023-11-04 ENCOUNTER — Ambulatory Visit: Payer: Self-pay | Admitting: Internal Medicine

## 2023-11-04 NOTE — Progress Notes (Signed)
 Lab results look good the previous mildly elevated hemoglobin was back down to 15.3 which is in the normal range.  Hematocrit of 46.7 still slightly above normal but decreased compared to 2 months ago.  The fact that these have improved without intervention and were barely abnormal is reassuring they do not seem to show any real problem.  Kidney and liver function tests are normal.

## 2023-11-18 NOTE — Progress Notes (Signed)
 This encounter was created in error - please disregard.

## 2023-11-19 ENCOUNTER — Ambulatory Visit: Admitting: Pulmonary Disease

## 2023-12-07 ENCOUNTER — Other Ambulatory Visit: Payer: Self-pay | Admitting: Internal Medicine

## 2023-12-07 ENCOUNTER — Other Ambulatory Visit: Payer: Self-pay | Admitting: Pulmonary Disease

## 2023-12-07 ENCOUNTER — Other Ambulatory Visit: Payer: Self-pay | Admitting: *Deleted

## 2023-12-07 DIAGNOSIS — M0579 Rheumatoid arthritis with rheumatoid factor of multiple sites without organ or systems involvement: Secondary | ICD-10-CM

## 2023-12-07 DIAGNOSIS — J90 Pleural effusion, not elsewhere classified: Secondary | ICD-10-CM

## 2023-12-07 MED ORDER — METHOTREXATE SODIUM CHEMO INJECTION 50 MG/2ML: 12.5000 mg | INTRAMUSCULAR | 0 refills | Status: DC

## 2023-12-07 NOTE — Telephone Encounter (Signed)
 Patient contacted the office stating she would like to know why her prescription for MTX was denied.  Returned call to patient to advise her prescription was denied because it is too early for refill. Patient advised we sent her a 90 day supply was sent to the pharmacy on 10/27/2023. Patient states she advised that she can only get 2 doses out of a vial and then get rid of that vial. Patient advised each vial should contain 4 doses. Patient states she will need a new prescription and will pay out of pocket if need be.   Last Fill: 10/27/2023  Labs: 11/03/2023 Lab results look good the previous mildly elevated hemoglobin was back down to 15.3 which is in the normal range.  Hematocrit of 46.7 still slightly above normal but decreased compared to 2 months ago.  The fact that these have improved without intervention and were barely abnormal is reassuring they do not seem to show any real problem.  Kidney and liver function tests are normal.   Next Visit: 02/09/2024  Last Visit: 11/03/2023  DX: Rheumatoid arthritis with positive rheumatoid factor, involving unspecified site   Current Dose per office note 11/03/2023: methotrexate  12.5mg  subcutaneous injections weekly,   Okay to refill Methotrexate ?

## 2023-12-22 ENCOUNTER — Ambulatory Visit (INDEPENDENT_AMBULATORY_CARE_PROVIDER_SITE_OTHER): Admitting: Pulmonary Disease

## 2023-12-22 ENCOUNTER — Encounter: Payer: Self-pay | Admitting: Pulmonary Disease

## 2023-12-22 ENCOUNTER — Ambulatory Visit

## 2023-12-22 VITALS — BP 117/78 | HR 99 | Temp 97.7°F | Ht 62.0 in | Wt 160.6 lb

## 2023-12-22 DIAGNOSIS — J90 Pleural effusion, not elsewhere classified: Secondary | ICD-10-CM | POA: Diagnosis not present

## 2023-12-22 NOTE — Patient Instructions (Addendum)
 Your chest x-ray looks good today  Continue on methotrexate  and prednisone   Follow up in 6 months

## 2023-12-22 NOTE — Progress Notes (Signed)
 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   She has been doing well since last visit. She had a fall in February and suffered large skin tear on right forearm which has healed up well. Fortunately no fractures from the fall. No major issues with dyspnea.   She remains on methotrexate , prednisone  and lasix .   Chest radiograph today appears improved compared to radiograph from last visit in January.   Past Medical History:  Diagnosis Date   A-fib [facility])    Allergy    Atrial fibrillation (HCC)    FH: cholecystectomy 1997   Macular degeneration    OSA (obstructive sleep apnea)    mild with total AHI 7.25/hr and severe during REM sleep at 35/hr now on CPAP at 6cm H2O     Family History  Problem Relation Age of Onset   Osteoporosis Mother    Macular degeneration Mother    Heart Problems Father    Stroke Father    Diabetes Brother    Kidney failure Brother    Cancer Maternal Grandmother    Dementia Paternal Grandmother    Suicidality Other    Heart attack Neg Hx      Social History   Socioeconomic History   Marital status: Married    Spouse name: Not on file   Number of children: Not on file   Years of education: Not on file   Highest education level: Not on file  Occupational History   Not on file  Tobacco Use   Smoking status: Never    Passive exposure: Past   Smokeless tobacco: Never  Vaping Use   Vaping status: Never Used  Substance and Sexual Activity   Alcohol  use: Not Currently   Drug use: Never   Sexual activity: Not on file  Other Topics Concern   Not on file  Social  History Narrative   ** Merged History Encounter **       Social Drivers of Health   Financial Resource Strain: Not on file  Food Insecurity: No Food Insecurity (07/04/2022)   Hunger Vital Sign    Worried About Running Out of Food in the Last Year: Never true    Ran Out of Food in the Last Year: Never true  Transportation Needs: No Transportation Needs (07/04/2022)   PRAPARE - Administrator, Civil Service (Medical): No    Lack of Transportation (Non-Medical): No  Physical Activity: Not on file  Stress: Not on file  Social Connections: Not on file  Intimate Partner Violence: Not on file     Allergies  Allergen Reactions   Erythromycin Other (See Comments)    Other reaction(s): burning   Azathioprine      Other Reaction(s): WEAKNESS,FATIGUE HAIR LOSS   Sulfa  Antibiotics     Other reaction(s): Unknown   Azithromycin  Other (See Comments)    Pain in arm with IV infusion    Condrolite [Glucosamine-Chondroitin-Msm] Rash   Glucosamine Rash    Other reaction(s): rash Other reaction(s): rash   Glucosamine Sulfate-Msm Rash   Sulfamethoxazole -Trimethoprim  Rash    Other reaction(s): rash Other reaction(s): rash  Outpatient Medications Prior to Visit  Medication Sig Dispense Refill   amoxicillin (AMOXIL) 500 MG capsule TAKE 4 CAPSULES BY MOUTH 1 HOUR BEFORE APPOINTMENT     atenolol  (TENORMIN ) 25 MG tablet Take half tablet by mouth daily as needed for heart rate >110 45 tablet 3   CALCIUM  CITRATE-VITAMIN D  PO Take 1 tablet by mouth at bedtime.     carboxymethylcellulose (REFRESH PLUS) 0.5 % SOLN Place 1 drop into both eyes 2 (two) times daily.     ELIQUIS  5 MG TABS tablet TAKE 1 TABLET BY MOUTH TWICE A DAY 60 tablet 6   EPIPEN 2-PAK 0.3 MG/0.3ML SOAJ injection Inject 0.3 mg into the muscle once as needed for anaphylaxis (severe allergic reaction).  0   famotidine  (PEPCID ) 10 MG tablet Take 1-2 tablets by mouth twice daily as needed 60 tablet 6   folic acid  (FOLVITE ) 1 MG  tablet Take 1 tablet (1 mg total) by mouth daily. 90 tablet 3   furosemide  (LASIX ) 20 MG tablet TAKE 1 TABLET BY MOUTH EVERY DAY 90 tablet 2   methotrexate  50 MG/2ML injection Inject 0.5 mLs (12.5 mg total) into the skin once a week. 6 mL 0   Multiple Vitamin (MULTIVITAMIN WITH MINERALS) TABS tablet Take 1 tablet by mouth every morning.     Multiple Vitamins-Minerals (PRESERVISION AREDS PO) Take 1 capsule by mouth 2 (two) times daily.     potassium chloride  (KLOR-CON ) 20 MEQ packet USE 1 PACKET ONCE DAILY 90 packet 2   predniSONE  (DELTASONE ) 5 MG tablet TAKE 1 TABLET BY MOUTH EVERY DAY WITH BREAKFAST 30 tablet 3   TUBERCULIN SYR 1CC/26GX3/8 26G X 3/8 1 ML MISC Use for methotrexate  injection once weekly 25 each 0   zoledronic  acid (RECLAST ) 5 MG/100ML SOLN injection Inject 5 mg into the vein once.     No facility-administered medications prior to visit.   Review of Systems  Constitutional:  Negative for chills, fever, malaise/fatigue and weight loss.  HENT:  Negative for congestion, sinus pain and sore throat.   Eyes: Negative.   Respiratory:  Positive for shortness of breath (with exertion). Negative for cough, hemoptysis, sputum production and wheezing.   Cardiovascular:  Negative for chest pain, palpitations, orthopnea, claudication and leg swelling.  Gastrointestinal:  Negative for abdominal pain, heartburn, nausea and vomiting.  Genitourinary: Negative.   Musculoskeletal:  Negative for joint pain and myalgias.  Skin:  Negative for rash.  Neurological:  Negative for weakness.  Endo/Heme/Allergies:  Bruises/bleeds easily.  Psychiatric/Behavioral: Negative.     Objective:   Vitals:   12/22/23 0932  BP: 117/78  Pulse: 99  Temp: 97.7 F (36.5 C)  TempSrc: Temporal  SpO2: 98%  Weight: 160 lb 9.6 oz (72.8 kg)  Height: 5' 2 (1.575 m)     Physical Exam Constitutional:      General: She is not in acute distress.    Appearance: She is not ill-appearing.  HENT:     Head:  Normocephalic and atraumatic.  Cardiovascular:     Rate and Rhythm: Normal rate and regular rhythm.     Pulses: Normal pulses.     Heart sounds: Normal heart sounds. No murmur heard. Pulmonary:     Effort: Pulmonary effort is normal.     Breath sounds: Examination of the left-lower field reveals decreased breath sounds. Decreased breath sounds present. No wheezing or rhonchi.  Musculoskeletal:     Right lower leg: No edema.     Left lower leg: No edema.  Skin:  General: Skin is warm and dry.  Neurological:     General: No focal deficit present.     Mental Status: She is alert.    CBC    Component Value Date/Time   WBC 6.0 11/03/2023 1331   RBC 4.92 11/03/2023 1331   HGB 15.3 11/03/2023 1331   HGB 15.7 12/27/2018 0831   HCT 46.7 (H) 11/03/2023 1331   HCT 47.4 (H) 12/27/2018 0831   PLT 226 11/03/2023 1331   PLT 213 12/27/2018 0831   MCV 94.9 11/03/2023 1331   MCV 90 12/27/2018 0831   MCH 31.1 11/03/2023 1331   MCHC 32.8 11/03/2023 1331   RDW 14.4 11/03/2023 1331   RDW 13.1 12/27/2018 0831   LYMPHSABS 472 (L) 02/03/2023 1112   MONOABS 0.7 05/01/2022 0933   EOSABS 12 (L) 11/03/2023 1331   BASOSABS 30 11/03/2023 1331      Latest Ref Rng & Units 11/03/2023    1:31 PM 08/11/2023   11:36 AM 07/05/2023    3:29 PM  BMP  Glucose 65 - 99 mg/dL 872  87  893   BUN 7 - 25 mg/dL 13  14  10    Creatinine 0.60 - 0.95 mg/dL 9.31  9.38  9.28   BUN/Creat Ratio 6 - 22 (calc) SEE NOTE:  SEE NOTE:    Sodium 135 - 146 mmol/L 135  135  137   Potassium 3.5 - 5.3 mmol/L 4.4  4.2  4.5   Chloride 98 - 110 mmol/L 98  98  100   CO2 20 - 32 mmol/L 31  30  28    Calcium  8.6 - 10.4 mg/dL 9.2  9.3  8.9    Chest imaging: CXR 12/22/26 - personal read Improved aeration of the left middle and lower lobes. Small to moderate left pleural effusion remains  PFT:     No data to display         Echo 11/29/21: Normal LV EF. Mildly reduced RV.  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  Left Pleural Effusion - improved on chest x-ray today - She is to continue prednisone  5mg  daily and methotrexate  weekly. Following with rheumatology  Follow up in 6 months.  Dorn Chill, MD [facility] Office: [phone]   Current Outpatient Medications:    amoxicillin (AMOXIL) 500 MG capsule, TAKE 4 CAPSULES BY MOUTH 1 HOUR BEFORE APPOINTMENT, Disp: , Rfl:    atenolol  (TENORMIN ) 25 MG tablet, Take half tablet by mouth daily as needed for heart rate >110, Disp: 45 tablet, Rfl: 3   CALCIUM  CITRATE-VITAMIN D  PO, Take 1 tablet by mouth at bedtime., Disp: , Rfl:    carboxymethylcellulose (REFRESH PLUS) 0.5 % SOLN, Place 1 drop into both eyes 2 (two) times daily., Disp: , Rfl:    ELIQUIS  5 MG TABS tablet, TAKE 1 TABLET BY MOUTH TWICE A DAY, Disp: 60 tablet, Rfl: 6   EPIPEN 2-PAK 0.3 MG/0.3ML SOAJ injection, Inject 0.3 mg into the muscle once as needed for anaphylaxis (severe allergic reaction)., Disp: , Rfl: 0   famotidine  (PEPCID ) 10 MG tablet, Take 1-2 tablets by mouth twice daily as needed, Disp: 60 tablet, Rfl: 6   folic acid  (FOLVITE ) 1 MG tablet, Take  1 tablet (1 mg total) by mouth daily., Disp: 90 tablet, Rfl: 3   furosemide  (LASIX ) 20 MG tablet, TAKE 1 TABLET BY MOUTH EVERY DAY, Disp: 90 tablet, Rfl: 2   methotrexate  50 MG/2ML injection, Inject 0.5 mLs (12.5 mg total) into the skin once a week., Disp: 6 mL, Rfl: 0   Multiple Vitamin (MULTIVITAMIN WITH MINERALS) TABS tablet, Take 1 tablet by mouth every morning., Disp: , Rfl:    Multiple Vitamins-Minerals (PRESERVISION AREDS PO), Take 1 capsule by mouth 2 (two) times daily., Disp: , Rfl:    potassium chloride  (KLOR-CON ) 20 MEQ packet, USE 1 PACKET ONCE  DAILY, Disp: 90 packet, Rfl: 2   predniSONE  (DELTASONE ) 5 MG tablet, TAKE 1 TABLET BY MOUTH EVERY DAY WITH BREAKFAST, Disp: 30 tablet, Rfl: 3   TUBERCULIN SYR 1CC/26GX3/8 26G X 3/8 1 ML MISC, Use for methotrexate  injection once weekly, Disp: 25 each, Rfl: 0   zoledronic  acid (RECLAST ) 5 MG/100ML SOLN injection, Inject 5 mg into the vein once., Disp: , Rfl:

## 2024-01-12 ENCOUNTER — Other Ambulatory Visit: Payer: Self-pay | Admitting: Internal Medicine

## 2024-01-12 DIAGNOSIS — I480 Paroxysmal atrial fibrillation: Secondary | ICD-10-CM

## 2024-01-12 NOTE — Telephone Encounter (Signed)
 Prescription refill request for Eliquis  received. Indication:afib Last office visit:1/25 Scr:0.68  6/25 Age: 83 Weight:72.8  kg  Prescription refilled

## 2024-01-26 NOTE — Progress Notes (Signed)
 Office Visit Note  Patient: Rebecca Short             Date of Birth: 1941-03-01           MRN: [id number]             PCP: Rebecca Carlin Redbird, MD Referring: Rebecca Carlin Redbird, MD Visit Date: 02/09/2024   Subjective:  Rheumatoid Arthritis   Discussed the use of AI scribe software for clinical note transcription with the patient, who gave verbal consent to proceed.  History of Present Illness   Rebecca Short is a 83 y.o. 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  She has a history of lung issues, with her left lung being about half fluid and half air, while the right lung is clear. Most recent imaging unclear per report the radiologist said 'fifty percent' but did not specify if it was less, more, or stable compared to the previous x-ray. A previous x-ray in January showed a decrease in effusion compared to prior imaging.  In terms of her general health, she reports no new joint or skin problems and is able to perform activities such as climbing stairs and using weight machines. She manages leg swelling with compression socks, which she states keeps the swelling down. No significant leg swelling is reported.       Previous HPI 11/03/2023 Rebecca Short is a 83 y.o. 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 Is currently on methotrexate  12.5mg  [place] weekly and 1 mg of folic acid  daily.     She has observed a recent increase in her hemoglobin and hematocrit levels since March and is concerned about the implications. She uses a CPAP machine nightly for sleep apnea, which was diagnosed after experiencing symptoms such as waking up with a snort. She regularly monitors her blood pressure, oxygen, and heart rate, noting no significant changes during exercise.   She has a history of osteoarthritis, which  causes joint aches, particularly with weather changes. After a fall in February, she has been cautious about physical activity due to her use of Eliquis . She has resumed some exercise, including using a treadmill and weight machines, but remains careful to avoid further falls.   She recently resolved issues obtaining syringes and compression socks, which are now working well for her. She also started Reclast  in December for bone density management.   No new symptoms have been experienced aside from joint aches and concerns about hemoglobin levels.    Previous HPI 08/11/2023 Rebecca Short is a 83 y.o. 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 Is currently on 12.5mg  [place] weekly and 1 mg of folic acid  daily. She is also on prednisone  5mg  daily. Has not had any joint pain. She did suffer a mechanical fall in February. Went to the ED as she is on eliquis  for afib. Incidentally, it was discovered that she had a meningioma that had slightly grown in size, however she has been asymptomatic from that. She did hit her arm and scraped it, she also hit her hip. No fractures. Her hip pain has been improving.     She denies any worsening SOB, chest pain, or pleurisy. Recently saw pulmonology and has a slightly decreased pleural effusion on the left.    Ms.  Melcher is also wondering about the current size of her needles, as her pharmacy does not carry the ones she was prescribed and has longer ones.    Previous HPI Rebecca Short is  a 83 y.o. female here for follow up for seropositive RA primarily pleural effusion involvement on MTX 12.5 mg [place] weekly and folic acid  1 mg daily and prednisone  5 mg daily.     Previous HPI 05/05/2023 Rebecca Short is a 83 y.o. female here for follow up for seropositive RA primarily pleural effusion involvement on MTX 12.5 mg [place] weekly and folic acid  1 mg daily and prednisone  5 mg daily.  She presents with concerns about worsening bone density. They  report a previous bone density measurement of -4, which improved to -2.9 on most recent DEXA after two years of self-directed exercises. Despite this improvement, the patient acknowledges that their bone density remains in the osteoporosis range. They express frustration with the impact of prednisone  on their bone density, describing it as a constant battle.   The patient also reports edema in their ankles, which they monitor daily for changes in size. They have tried compression socks for relief, but found them uncomfortable, particularly around the toes. They express interest in trying open-toe compression socks as a potential solution.   In addition to these concerns, the patient mentions a history of sensitivity to various medications, including Fosamax and methotrexate , which they were reluctant to take. They express a similar hesitance towards starting new treatments for their osteoporosis, but seem open to considering an annual Reclast  infusion after discussing it with their physician.   The patient's overall health status appears to be stable, with no reported changes in symptoms or new health issues since their last visit. They deny experiencing any shortness of breath or worsening of their edema.    Previous HPI 02/03/2023 Rebecca Short is a 83 y.o. female here for follow up for seropositive RA primarily pleural effusion involvement on MTX 12.5 mg [place] weekly and folic acid  1 mg daily and prednisone  5 mg daily.  Since our last visit symptoms are stable and doing pretty well from a functional standpoint.  She is working with physical therapy on her mobility and balance.  Feels her shortness of breath with movement and talking is partially improved.  Last x-ray with Dr. Kara in June with stable left pleural effusion possible improvement in right side.  Still has persistent bruising on extremities no new skin tears.   Previous HPI 11/03/2022 Rebecca Short is a 84 y.o. female here for follow up  for seropositive RA on MTX 12.5 mg  weekly and folic acid  1 mg daily and prednisone  5 mg daily.  Since our last visit she tapered prednisone  down to 5 mg daily which was tolerable but subsequent tapering to 2.5 mg caused worsening of symptoms with shortness of breath, decreased lung volume on at home incentive spirometry, and was found to have right lung effusion at pulmonology clinic evaluation.  She is now back to 5 mg daily.  Also had some adjustment of Lasix  dosing for pedal edema with elevated BNP is currently taking 20 mg daily.   Previous HPI 08/04/22 Rebecca Short is a 83 y.o. female here for follow up for seropositive RA association with serositis and persistent left pleural effusion on methotrexate  15 mg subcu weekly and folic acid  1 mg daily and prednisone  5 mg daily.  Since switching from oral to subcu methotrexate  she had a great improvement  in medication tolerance no longer feeling nauseated and extremely fatigued for 2 to 3 days after each dose.  Right shoulder pain is doing much better after the steroid injection in January as well.  She continued prednisone  tapering with close pulmonology follow-up down to 5 mg daily now and has not noticed any significant worsening of symptoms.  She is interested in getting off the remaining steroids also interested in getting updated vaccines to feel safer with more social contacts.   Previous HPI 06/04/22 Rebecca Short is a 83 y.o. female here for follow up for rheumatoid pleural effusion on MTX 15 mg PO weekly and folic acid  1 mg daily and prednisone  7.5 mg daily. She had some exacerbation with edema and dyspnea but stable pleural effusion on imaging. Labs with slightly worsening of lymphopenia last month as well.  She is noticing what she thinks is a bit more nausea and fatigue type side effects after the methotrexate  weekly dose than before taking up to a few days to fully recover rather than less than 1.  Since tapering the steroid dose down to 7.5 mg  about a week ago she is experiencing increased in shoulder pain and stiffness on both sides.     Previous HPI 03/03/22 Rebecca Short is a 83 y.o. female here for follow up for rheumatoid pleural effusion  on MTX 15 mg PO weekly and folic acid  1 mg daily and prednisone  15 mg daily. So far her respiratory symptoms are doing well since last visit. Her mood and energy level are very poor, and frustrated about inability to socialize and limited exertion tolerance. She reports alternating days of more and less energy, sometimes associated with the methotrexate  doses on Mondays but not always. Also having some concentration and memory difficulties increased from before for a few months.   Previous HPI 12/30/2021 Rebecca Short is a 83 y.o. female here for follow up rheumatoid pleural effusion on methotrexate  6 tablets once weekly, folic acid  1 mg daily. She was hospitalized last month with recurrent pleural effusion 7/4-7/7 requiring repeat thoracocentesis. Since discharge she is back onto higher dose prednisone  and symptoms are doing okay. We stopped her azathioprine  due to apparent treatment failure and she started methotrexate  since 7/31 after negative hepatitis screening. She has not seen any problems with GI intolerance. She does feel somewhat unsteady or lightheaded and having to move slowly on account of this.   Previous HPI 09/09/2021 Rebecca Short is a 82 y.o. female here for follow up for rheumatoid pleural effusion on azathioprine  100 mg daily and prednisone  taper and atovaquone  ppx. She is doing well since last visit without major events. She has some mild GI irritation with the azathioprine . She has significant bruising on her hands and forearms. She saw Dr. Kara for follow up with stable pulmonary symptoms and repeat chest xray obtained 3/28 was also stable. She is tapering the prednisone  of a rate about 5 mg/day dose per month currently down to 20 mg daily. She is curious about safety going to the  dentist for procedures on her medications.   Previous HPI 07/09/21 Rebecca Short is a 83 y.o. female here for follow up for rheumatoid pleural effusions after starting azathioprine  50 mg last month, continuing prednisone  40 mg daily and prophylactic atovaquone . She went for repeat chest xray with Dr. Kara that looked stable 2 weeks ago. So far she denies any major side effects after starting imuran .   Previous HPI 05/08/21 ORETTA BERKLAND is  a 83 y.o. female here for evaluation with recurrent pleural effusions and dyspnea with positive RF and ANCA Abs. She was hospitalized in August after progressively worsening dyspnea and hypotension with exudative pleural fluid removed from left lung space followed by pericardial drainage and initial treatment with colchicine . She subsequently underwent additional thoracentesis drainage of effusions later in August and beginning of September. Results showed neutrophil predominant fluid. Prednisone  40 mg daily was added to treatment with stable condition. This continued for a month but tapering the prednisone  resulted in more rapid re accumulation of fluid on a dose of about 20 gm daily or less. Imaging demonstrated progressive increase again in left sided effusions and repeat drainage again on 11/14 and 11/30 when down to 10 mg and 5 mg daily doses of prednisone  along with colchicine . She is now continuing prednisone  40 mg with atovaquone  prophylaxis and colchicine  0.6 mg daily. In addition to pulmonary symptoms she reports bilateral shoulder pain and stiffness developing around the initial onset of her dyspnea before hospitalization in August.  This improved completely on 40 mg prednisone  but she noticed definite recurrence of shoulder pain symptoms when tapering down the prednisone  to 10 or 5 mg/day dose.  No active complaints today since she is back on higher dose medication.  She denies any peripheral joint pain involvement did not notice any significant swelling or  numbness in her upper extremities associated with the pain.   Review of Systems  Constitutional:  Negative for fatigue.  HENT:  Negative for mouth sores and mouth dryness.   Eyes:  Positive for dryness.  Respiratory:  Negative for shortness of breath.   Cardiovascular:  Negative for chest pain and palpitations.  Gastrointestinal:  Negative for blood in stool, constipation and diarrhea.  Endocrine: Positive for increased urination.  Genitourinary:  Positive for involuntary urination.  Musculoskeletal:  Negative for joint pain, gait problem, joint pain, joint swelling, myalgias, muscle weakness, morning stiffness, muscle tenderness and myalgias.  Skin:  Positive for hair loss and sensitivity to sunlight. Negative for color change and rash.  Allergic/Immunologic: Negative for susceptible to infections.  Neurological:  Negative for dizziness and headaches.  Hematological:  Negative for swollen glands.  Psychiatric/Behavioral:  Negative for depressed mood and sleep disturbance. The patient is not nervous/anxious.     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   Pleural effusion 11/26/2021   Serositis (HCC) 09/27/2021   Fatigue 09/27/2021   Macular pucker, left eye 06/18/2021   High risk medication use 05/30/2021   Bilateral shoulder pain 05/08/2021   Vitamin D  deficiency 05/08/2021   Abnormal ANCA test 05/08/2021   RA (rheumatoid arthritis) (HCC) 04/22/2021   S/P thoracentesis    Arterial hypotension    Pleural effusion on left    Pericardial effusion    Leukocytosis 01/03/2021   Community acquired pneumonia 01/03/2021   Atrial fibrillation (HCC) 12/24/2020   Atrial fibrillation with RVR (HCC) 12/23/2020   Musculoskeletal pain 12/23/2020   Posterior vitreous detachment of both eyes 04/30/2020   Macular pucker, right eye  11/08/2019   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HCC) 11/08/2019   Intermediate stage nonexudative age-related macular degeneration of both eyes 11/08/2019   Anticoagulated 11/18/2016   Bradycardia 03/23/2014   OSA (obstructive sleep apnea) 01/19/2014   Obesity (BMI 30-39.9) 01/19/2014   Paroxysmal A-fib (HCC) 10/04/2013  Nonspecific abnormal results of cardiovascular function study 07/21/2013   Acute gastroenteritis 07/15/2013    Past Medical History:  Diagnosis Date   A-fib (HCC)    Allergy     Atrial fibrillation (HCC)    FH: cholecystectomy 1997   Macular degeneration    OSA (obstructive sleep apnea)    mild with total AHI 7.25/hr and severe during REM sleep at 35/hr now on CPAP at 6cm H2O    Family History  Problem Relation Age of Onset   Osteoporosis Mother    Macular degeneration Mother    Heart Problems Father    Stroke Father    Diabetes Brother    Kidney failure Brother    Cancer Maternal Grandmother    Dementia Paternal Grandmother    Suicidality Other    Heart attack Neg Hx    Past Surgical History:  Procedure Laterality Date   CHEST TUBE INSERTION Left 11/27/2021   Procedure: CHEST TUBE INSERTION;  Surgeon: Kara Dorn NOVAK, MD;  Location: [facility] ENDOSCOPY;  Service: Pulmonary;  Laterality: Left;  Prefer AM slot, thanks   CHOLECYSTECTOMY     PERICARDIOCENTESIS N/A 01/07/2021   Procedure: PERICARDIOCENTESIS;  Surgeon: Mady Bruckner, MD;  Location: [facility] INVASIVE CV LAB;  Service: Cardiovascular;  Laterality: N/A;   REPLACEMENT TOTAL KNEE BILATERAL     THORACENTESIS N/A 01/25/2021   Procedure: THORACENTESIS;  Surgeon: Kara Dorn NOVAK, MD;  Location: [facility] ENDOSCOPY;  Service: Pulmonary;  Laterality: N/A;   THORACENTESIS N/A 04/08/2021   Procedure: THORACENTESIS;  Surgeon: Kara Dorn NOVAK, MD;  Location: [facility] ENDOSCOPY;  Service: Pulmonary;  Laterality: N/A;   THORACENTESIS N/A 04/24/2021   Procedure:  THORACENTESIS;  Surgeon: Alaine Vicenta NOVAK, MD;  Location: [facility] ENDOSCOPY;  Service: Cardiopulmonary;  Laterality: N/A;   Social History   Social History Narrative   ** Merged History Encounter **       Immunization History  Administered Date(s) Administered   INFLUENZA, HIGH DOSE SEASONAL PF 07/05/2014, 03/03/2015, 06/29/2015, 07/09/2016, 03/03/2017, 07/24/2017, 02/05/2018, 06/23/2018, 06/08/2019, 02/18/2020, 06/08/2020   Influenza, Quadrivalent, Recombinant, Inj, Pf 03/04/2019   Influenza-Unspecified 01/24/2014, 02/23/2023   PFIZER Comirnaty(Gray Top)Covid-19 Tri-Sucrose Vaccine 02/25/2023   PFIZER(Purple Top)SARS-COV-2 Vaccination 06/16/2019, 07/07/2019, 02/18/2020, 06/08/2020   PNEUMOCOCCAL CONJUGATE-20 08/05/2022   Pneumococcal Polysaccharide-23 03/22/2015, 06/29/2015, 07/09/2016, 07/24/2017, 06/23/2018, 06/08/2019, 06/08/2020, 06/03/2021   RSV,unspecified 02/27/2023   Zoster Recombinant(Shingrix) 03/09/2017     Objective: Vital Signs: BP 126/76 (BP Location: Right Arm, Patient Position: Sitting, Cuff Size: Small)   Pulse 73   Temp 98.3 F (36.8 C)   Resp 13   Ht 5' 2 (1.575 m)   Wt 163 lb (73.9 kg)   BMI 29.81 kg/m    Physical Exam Eyes:     Conjunctiva/sclera: Conjunctivae normal.  Cardiovascular:     Rate and Rhythm: Normal rate and regular rhythm.  Pulmonary:     Effort: Pulmonary effort is normal.     Comments: Partly decreased breath sounds on left lower field Skin:    General: Skin is warm and dry.     Findings: Bruising present.  Neurological:     Mental Status: She is alert.  Psychiatric:        Mood and Affect: Mood normal.      Musculoskeletal Exam:  Shoulders full ROM no tenderness or swelling Elbows full ROM no tenderness or swelling Wrists full ROM no tenderness or swelling Fingers full ROM no tenderness or swelling, heberdon's nodes b/l no palpable swelling or tenderness Knees full ROM no tenderness or swelling, bilateral  patellofemoral  crepitus Ankles full ROM no tenderness or swelling  Investigation: No additional findings.  Imaging: No results found.  Recent Labs: Lab Results  Component Value Date   WBC 7.5 02/09/2024   HGB 15.7 (H) 02/09/2024   PLT 252 02/09/2024   NA 136 02/09/2024   K 4.2 02/09/2024   CL 97 (L) 02/09/2024   CO2 31 02/09/2024   GLUCOSE 88 02/09/2024   BUN 13 02/09/2024   CREATININE 0.68 02/09/2024   BILITOT 0.6 02/09/2024   ALKPHOS 51 05/01/2022   AST 20 02/09/2024   ALT 22 02/09/2024   PROT 5.8 (L) 02/09/2024   ALBUMIN 3.7 05/01/2022   CALCIUM  9.3 02/09/2024   GFRAA 90 12/27/2018   QFTBGOLDPLUS Negative 11/28/2021    Speciality Comments: No specialty comments available.  Procedures:  No procedures performed Allergies: Erythromycin, Azathioprine , Sulfa  antibiotics, Azithromycin , Condrolite [glucosamine-chondroitin-msm], Glucosamine, Glucosamine sulfate-msm, and Sulfamethoxazole -trimethoprim    Assessment / Plan:     Visit Diagnoses: Rheumatoid arthritis with positive rheumatoid factor, involving unspecified site (HCC) - Plan: Tuberculin-Allergy  Syringes 27G X 1/2 0.5 ML MISC, Sedimentation rate Rheumatoid arthritis managed with methotrexate . Previous blood work showed slight abnormalities in red blood cell parameters, likely due to methotrexate , but not clinically significant. Holding methotrexate  post-vaccination increases antibody levels by approximately one-third compared to continuous methotrexate  use. -Continue methotrexate  12.5 mg subcu weekly folic acid  1 mg daily -Continue prednisone  5 mg daily - Hold methotrexate  for two weeks following flu and COVID vaccinations to optimize antibody response.  High risk medication use - Continue methotrexate  17.5mg  subcutaneous injections weekly and folic acid  1 mg daily - Plan: CBC with Differential/Platelet, Comprehensive metabolic panel with GFR, Lipid panel, Hemoglobin A1c - Checking CBC and CMP for medication monitoring continue  long-term use of methotrexate   Left pleural effusion Chronic left pleural effusion with approximately 50% fluid and 50% air in the left lung space. Recent chest x-ray reviewed by Dr. Kara indicates no significant changes. She is at risk for significant viral respiratory infections due to reduced lung capacity, as she is effectively functioning with one and a half lungs.   Osteoporosis On Reclast  infusion treatment first dosing 05/22/2023 send next dose would be due December this year.       Orders: Orders Placed This Encounter  Procedures   Sedimentation rate   CBC with Differential/Platelet   Comprehensive metabolic panel with GFR   Lipid panel   Hemoglobin A1c   Meds ordered this encounter  Medications   Tuberculin-Allergy  Syringes 27G X 1/2 0.5 ML MISC    Sig: Use as directed to administer Methotrexare    Dispense:  12 each    Refill:  3     Follow-Up Instructions: Return in about 3 months (around 05/10/2024) for RA/lung on MTX/GC f/u 3mos.   Lonni LELON Ester, MD  Note - This record has been created using [employer].  Chart creation errors have been sought, but may not always  have been located. Such creation errors do not reflect on  the standard of medical care.

## 2024-02-09 ENCOUNTER — Other Ambulatory Visit: Payer: Self-pay | Admitting: Internal Medicine

## 2024-02-09 ENCOUNTER — Ambulatory Visit: Attending: Internal Medicine | Admitting: Internal Medicine

## 2024-02-09 ENCOUNTER — Encounter: Payer: Self-pay | Admitting: Internal Medicine

## 2024-02-09 VITALS — BP 126/76 | HR 73 | Temp 98.3°F | Resp 13 | Ht 62.0 in | Wt 163.0 lb

## 2024-02-09 DIAGNOSIS — J90 Pleural effusion, not elsewhere classified: Secondary | ICD-10-CM | POA: Insufficient documentation

## 2024-02-09 DIAGNOSIS — M810 Age-related osteoporosis without current pathological fracture: Secondary | ICD-10-CM | POA: Insufficient documentation

## 2024-02-09 DIAGNOSIS — M059 Rheumatoid arthritis with rheumatoid factor, unspecified: Secondary | ICD-10-CM | POA: Diagnosis present

## 2024-02-09 DIAGNOSIS — Z7952 Long term (current) use of systemic steroids: Secondary | ICD-10-CM | POA: Diagnosis present

## 2024-02-09 DIAGNOSIS — Z79899 Other long term (current) drug therapy: Secondary | ICD-10-CM | POA: Insufficient documentation

## 2024-02-09 MED ORDER — TUBERCULIN-ALLERGY SYRINGES 27G X 1/2" 0.5 ML MISC
3 refills | Status: AC
Start: 2024-02-09 — End: ?

## 2024-02-10 ENCOUNTER — Ambulatory Visit: Payer: Self-pay | Admitting: Internal Medicine

## 2024-02-10 LAB — COMPREHENSIVE METABOLIC PANEL WITH GFR
AG Ratio: 2.1 (calc) (ref 1.0–2.5)
ALT: 22 U/L (ref 6–29)
AST: 20 U/L (ref 10–35)
Albumin: 3.9 g/dL (ref 3.6–5.1)
Alkaline phosphatase (APISO): 64 U/L (ref 37–153)
BUN: 13 mg/dL (ref 7–25)
CO2: 31 mmol/L (ref 20–32)
Calcium: 9.3 mg/dL (ref 8.6–10.4)
Chloride: 97 mmol/L — ABNORMAL LOW (ref 98–110)
Creat: 0.68 mg/dL (ref 0.60–0.95)
Globulin: 1.9 g/dL (ref 1.9–3.7)
Glucose, Bld: 88 mg/dL (ref 65–99)
Potassium: 4.2 mmol/L (ref 3.5–5.3)
Sodium: 136 mmol/L (ref 135–146)
Total Bilirubin: 0.6 mg/dL (ref 0.2–1.2)
Total Protein: 5.8 g/dL — ABNORMAL LOW (ref 6.1–8.1)
eGFR: 86 mL/min/1.73m2 (ref >=60)

## 2024-02-10 LAB — CBC WITH DIFFERENTIAL/PLATELET
Absolute Lymphocytes: 495 {cells}/uL — ABNORMAL LOW (ref 850–3900)
Absolute Monocytes: 450 {cells}/uL (ref 200–950)
Basophils Absolute: 38 {cells}/uL (ref 0–200)
Basophils Relative: 0.5 %
Eosinophils Absolute: 30 {cells}/uL (ref 15–500)
Eosinophils Relative: 0.4 %
HCT: 46.9 % — ABNORMAL HIGH (ref 35.0–45.0)
Hemoglobin: 15.7 g/dL — ABNORMAL HIGH (ref 11.7–15.5)
MCH: 31.3 pg (ref 27.0–33.0)
MCHC: 33.5 g/dL (ref 32.0–36.0)
MCV: 93.4 fL (ref 80.0–100.0)
MPV: 10.9 fL (ref 7.5–12.5)
Monocytes Relative: 6 %
Neutro Abs: 6488 {cells}/uL (ref 1500–7800)
Neutrophils Relative %: 86.5 %
Platelets: 252 Thousand/uL (ref 140–400)
RBC: 5.02 Million/uL (ref 3.80–5.10)
RDW: 13.9 % (ref 11.0–15.0)
Total Lymphocyte: 6.6 %
WBC: 7.5 Thousand/uL (ref 3.8–10.8)

## 2024-02-10 LAB — HEMOGLOBIN A1C
Hgb A1c MFr Bld: 5.3 % (ref <5.7)
Mean Plasma Glucose: 105 mg/dL
eAG (mmol/L): 5.8 mmol/L

## 2024-02-10 LAB — SEDIMENTATION RATE: Sed Rate: 2 mm/h (ref 0–30)

## 2024-02-10 NOTE — Progress Notes (Signed)
 Hgb is slightly high at 15.7 and lymphocytes are low at 495. Kidney and liver function are normal. Sed rate remains normal. Her A1c is good at 5.3% so no blood sugar problems. No change in medications needed.

## 2024-02-26 ENCOUNTER — Other Ambulatory Visit: Payer: Self-pay | Admitting: Internal Medicine

## 2024-02-26 DIAGNOSIS — M0579 Rheumatoid arthritis with rheumatoid factor of multiple sites without organ or systems involvement: Secondary | ICD-10-CM

## 2024-02-26 NOTE — Telephone Encounter (Signed)
 Last Fill: 12/07/2023  Labs: 02/09/2024 Hgb is slightly high at 15.7 and lymphocytes are low at 495. Kidney and liver function are normal. Sed rate remains normal. Her A1c is good at 5.3% so no blood sugar problems. No change in medications needed.   Next Visit: 05/12/2024  Last Visit: 02/09/2024  DX: Rheumatoid arthritis with positive rheumatoid factor, involving unspecified site   Current Dose per office note 02/09/2024: methotrexate  12.5 mg subcu weekly   Okay to refill Methotrexate ?

## 2024-04-03 ENCOUNTER — Other Ambulatory Visit: Payer: Self-pay | Admitting: Pulmonary Disease

## 2024-04-03 DIAGNOSIS — J90 Pleural effusion, not elsewhere classified: Secondary | ICD-10-CM

## 2024-04-04 NOTE — Telephone Encounter (Signed)
 Yes, [place] to refill

## 2024-04-27 ENCOUNTER — Encounter (HOSPITAL_COMMUNITY): Payer: Self-pay | Admitting: Physician Assistant

## 2024-04-27 NOTE — Addendum Note (Signed)
 Addended by: DAYNE SHERRY RAMAN on: 04/27/2024 07:52 AM   Modules accepted: Orders

## 2024-05-03 NOTE — Progress Notes (Unsigned)
 Office Visit Note  Patient: Rebecca Short             Date of Birth: 1941/01/02           MRN: [id number]             PCP: Okey Carlin Redbird, MD Referring: Okey Carlin Redbird, MD Visit Date: 05/12/2024   Subjective:  No chief complaint on file.   History of Present Illness: Rebecca Short is a 83 y.o. 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   Previous HPI 02/09/2024 Rebecca Short is a 83 y.o. 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   She has a history of lung issues, with her left lung being about half fluid and half air, while the right lung is clear. Most recent imaging unclear per report the radiologist said 'fifty percent' but did not specify if it was less, more, or stable compared to the previous x-ray. A previous x-ray in January showed a decrease in effusion compared to prior imaging.   In terms of her general health, she reports no new joint or skin problems and is able to perform activities such as climbing stairs and using weight machines. She manages leg swelling with compression socks, which she states keeps the swelling down. No significant leg swelling is reported.         Previous HPI 11/03/2023 Rebecca Short is a 83 y.o. 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 Is currently on methotrexate  12.5mg  [place] weekly and 1 mg of folic acid  daily.     She has observed a recent increase in her hemoglobin and hematocrit levels since March and is concerned about the implications. She uses a CPAP machine nightly for sleep apnea, which was diagnosed after experiencing symptoms such as waking up with a snort. She regularly monitors her blood pressure, oxygen,  and heart rate, noting no significant changes during exercise.   She has a history of osteoarthritis, which causes joint aches, particularly with weather changes. After a fall in February, she has been cautious about physical activity due to her use of Eliquis . She has resumed some exercise, including using a treadmill and weight machines, but remains careful to avoid further falls.   She recently resolved issues obtaining syringes and compression socks, which are now working well for her. She also started Reclast  in December for bone density management.   No new symptoms have been experienced aside from joint aches and concerns about hemoglobin levels.    Previous HPI 08/11/2023 Rebecca Short is a 83 y.o. 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 Is currently on 12.5mg  [place] weekly and 1 mg of folic acid  daily. She is also on prednisone  5mg  daily. Has not had any joint pain. She did suffer a mechanical fall in February. Went to the ED as she is on eliquis  for afib. Incidentally, it was discovered that she had a meningioma that had slightly grown in size, however she has been asymptomatic from that. She did hit her arm and scraped it, she also hit her hip. No fractures. Her hip pain has been improving.  She denies any worsening SOB, chest pain, or pleurisy. Recently saw pulmonology and has a slightly decreased pleural effusion on the left.    Ms. Paschen is also wondering about the current size of her needles, as her pharmacy does not carry the ones she was prescribed and has longer ones.    Previous HPI Rebecca Short is  a 83 y.o. female here for follow up for seropositive RA primarily pleural effusion involvement on MTX 12.5 mg [place] weekly and folic acid  1 mg daily and prednisone  5 mg daily.     Previous HPI 05/05/2023 Rebecca Short is a 83 y.o. female here for follow up for seropositive RA primarily pleural effusion involvement on MTX 12.5 mg [place] weekly and folic  acid 1 mg daily and prednisone  5 mg daily.  She presents with concerns about worsening bone density. They report a previous bone density measurement of -4, which improved to -2.9 on most recent DEXA after two years of self-directed exercises. Despite this improvement, the patient acknowledges that their bone density remains in the osteoporosis range. They express frustration with the impact of prednisone  on their bone density, describing it as a constant battle.   The patient also reports edema in their ankles, which they monitor daily for changes in size. They have tried compression socks for relief, but found them uncomfortable, particularly around the toes. They express interest in trying open-toe compression socks as a potential solution.   In addition to these concerns, the patient mentions a history of sensitivity to various medications, including Fosamax and methotrexate , which they were reluctant to take. They express a similar hesitance towards starting new treatments for their osteoporosis, but seem open to considering an annual Reclast  infusion after discussing it with their physician.   The patient's overall health status appears to be stable, with no reported changes in symptoms or new health issues since their last visit. They deny experiencing any shortness of breath or worsening of their edema.    Previous HPI 02/03/2023 Rebecca Short is a 83 y.o. female here for follow up for seropositive RA primarily pleural effusion involvement on MTX 12.5 mg [place] weekly and folic acid  1 mg daily and prednisone  5 mg daily.  Since our last visit symptoms are stable and doing pretty well from a functional standpoint.  She is working with physical therapy on her mobility and balance.  Feels her shortness of breath with movement and talking is partially improved.  Last x-ray with Dr. Kara in June with stable left pleural effusion possible improvement in right side.  Still has persistent bruising on  extremities no new skin tears.   Previous HPI 11/03/2022 Rebecca Short is a 83 y.o. female here for follow up for seropositive RA on MTX 12.5 mg [place] weekly and folic acid  1 mg daily and prednisone  5 mg daily.  Since our last visit she tapered prednisone  down to 5 mg daily which was tolerable but subsequent tapering to 2.5 mg caused worsening of symptoms with shortness of breath, decreased lung volume on at home incentive spirometry, and was found to have right lung effusion at pulmonology clinic evaluation.  She is now back to 5 mg daily.  Also had some adjustment of Lasix  dosing for pedal edema with elevated BNP is currently taking 20 mg daily.   Previous HPI 08/04/22 Rebecca Short is a 83 y.o. female here for follow up for seropositive RA association with serositis and persistent left pleural effusion on methotrexate  15 mg  subcu weekly and folic acid  1 mg daily and prednisone  5 mg daily.  Since switching from oral to subcu methotrexate  she had a great improvement in medication tolerance no longer feeling nauseated and extremely fatigued for 2 to 3 days after each dose.  Right shoulder pain is doing much better after the steroid injection in January as well.  She continued prednisone  tapering with close pulmonology follow-up down to 5 mg daily now and has not noticed any significant worsening of symptoms.  She is interested in getting off the remaining steroids also interested in getting updated vaccines to feel safer with more social contacts.   Previous HPI 06/04/22 Rebecca Short is a 83 y.o. female here for follow up for rheumatoid pleural effusion on MTX 15 mg PO weekly and folic acid  1 mg daily and prednisone  7.5 mg daily. She had some exacerbation with edema and dyspnea but stable pleural effusion on imaging. Labs with slightly worsening of lymphopenia last month as well.  She is noticing what she thinks is a bit more nausea and fatigue type side effects after the methotrexate  weekly dose than before  taking up to a few days to fully recover rather than less than 1.  Since tapering the steroid dose down to 7.5 mg about a week ago she is experiencing increased in shoulder pain and stiffness on both sides.     Previous HPI 03/03/22 Rebecca Short is a 83 y.o. female here for follow up for rheumatoid pleural effusion  on MTX 15 mg PO weekly and folic acid  1 mg daily and prednisone  15 mg daily. So far her respiratory symptoms are doing well since last visit. Her mood and energy level are very poor, and frustrated about inability to socialize and limited exertion tolerance. She reports alternating days of more and less energy, sometimes associated with the methotrexate  doses on Mondays but not always. Also having some concentration and memory difficulties increased from before for a few months.   Previous HPI 12/30/2021 Rebecca Short is a 83 y.o. female here for follow up rheumatoid pleural effusion on methotrexate  6 tablets once weekly, folic acid  1 mg daily. She was hospitalized last month with recurrent pleural effusion 7/4-7/7 requiring repeat thoracocentesis. Since discharge she is back onto higher dose prednisone  and symptoms are doing okay. We stopped her azathioprine  due to apparent treatment failure and she started methotrexate  since 7/31 after negative hepatitis screening. She has not seen any problems with GI intolerance. She does feel somewhat unsteady or lightheaded and having to move slowly on account of this.   Previous HPI 09/09/2021 BROWNIE GOCKEL is a 83 y.o. female here for follow up for rheumatoid pleural effusion on azathioprine  100 mg daily and prednisone  taper and atovaquone  ppx. She is doing well since last visit without major events. She has some mild GI irritation with the azathioprine . She has significant bruising on her hands and forearms. She saw Dr. Kara for follow up with stable pulmonary symptoms and repeat chest xray obtained 3/28 was also stable. She is tapering the  prednisone  of a rate about 5 mg/day dose per month currently down to 20 mg daily. She is curious about safety going to the dentist for procedures on her medications.   Previous HPI 07/09/21 NEDDIE STEEDMAN is a 83 y.o. female here for follow up for rheumatoid pleural effusions after starting azathioprine  50 mg last month, continuing prednisone  40 mg daily and prophylactic atovaquone . She went for repeat chest xray with Dr. Kara  that looked stable 2 weeks ago. So far she denies any major side effects after starting imuran .   Previous HPI 05/08/21 KJERSTI DITTMER is a 83 y.o. female here for evaluation with recurrent pleural effusions and dyspnea with positive RF and ANCA Abs. She was hospitalized in August after progressively worsening dyspnea and hypotension with exudative pleural fluid removed from left lung space followed by pericardial drainage and initial treatment with colchicine . She subsequently underwent additional thoracentesis drainage of effusions later in August and beginning of September. Results showed neutrophil predominant fluid. Prednisone  40 mg daily was added to treatment with stable condition. This continued for a month but tapering the prednisone  resulted in more rapid re accumulation of fluid on a dose of about 20 gm daily or less. Imaging demonstrated progressive increase again in left sided effusions and repeat drainage again on 11/14 and 11/30 when down to 10 mg and 5 mg daily doses of prednisone  along with colchicine . She is now continuing prednisone  40 mg with atovaquone  prophylaxis and colchicine  0.6 mg daily. In addition to pulmonary symptoms she reports bilateral shoulder pain and stiffness developing around the initial onset of her dyspnea before hospitalization in August.  This improved completely on 40 mg prednisone  but she noticed definite recurrence of shoulder pain symptoms when tapering down the prednisone  to 10 or 5 mg/day dose.  No active complaints today since she is  back on higher dose medication.  She denies any peripheral joint pain involvement did not notice any significant swelling or numbness in her upper extremities associated with the pain.   No Rheumatology ROS completed.   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   Pleural effusion 11/26/2021   Serositis (HCC) 09/27/2021   Fatigue 09/27/2021   Macular pucker, left eye 06/18/2021   High risk medication use 05/30/2021   Bilateral shoulder pain 05/08/2021   Vitamin D  deficiency 05/08/2021   Abnormal ANCA test 05/08/2021   RA (rheumatoid arthritis) (HCC) 04/22/2021   S/P thoracentesis    Arterial hypotension    Pleural effusion on left    Pericardial effusion    Leukocytosis 01/03/2021   Community acquired pneumonia 01/03/2021   Atrial fibrillation (HCC) 12/24/2020   Atrial fibrillation with RVR (HCC) 12/23/2020   Musculoskeletal pain 12/23/2020   Posterior vitreous detachment of both eyes 04/30/2020   Macular pucker, right eye 11/08/2019   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HCC) 11/08/2019   Intermediate stage nonexudative age-related macular degeneration of both eyes 11/08/2019   Anticoagulated 11/18/2016   Bradycardia 03/23/2014   OSA (obstructive sleep apnea) 01/19/2014   Obesity (BMI 30-39.9) 01/19/2014   Paroxysmal A-fib (HCC) 10/04/2013   Nonspecific abnormal results of cardiovascular function study 07/21/2013   Acute gastroenteritis 07/15/2013    Past Medical History:  Diagnosis Date   A-fib (HCC)    Allergy     Atrial fibrillation (HCC)    FH: cholecystectomy 1997   Macular degeneration    OSA (obstructive sleep apnea)    mild with total AHI 7.25/hr and severe during REM sleep at 35/hr now on CPAP at 6cm H2O    Family History  Problem Relation Age of  Onset   Osteoporosis Mother    Macular degeneration Mother    Heart Problems Father    Stroke Father    Diabetes Brother    Kidney  failure Brother    Cancer Maternal Grandmother    Dementia Paternal Grandmother    Suicidality Other    Heart attack Neg Hx    Past Surgical History:  Procedure Laterality Date   CHEST TUBE INSERTION Left 11/27/2021   Procedure: CHEST TUBE INSERTION;  Surgeon: Kara Dorn NOVAK, MD;  Location: [facility] ENDOSCOPY;  Service: Pulmonary;  Laterality: Left;  Prefer AM slot, thanks   CHOLECYSTECTOMY     PERICARDIOCENTESIS N/A 01/07/2021   Procedure: PERICARDIOCENTESIS;  Surgeon: Mady Bruckner, MD;  Location: [facility] INVASIVE CV LAB;  Service: Cardiovascular;  Laterality: N/A;   REPLACEMENT TOTAL KNEE BILATERAL     THORACENTESIS N/A 01/25/2021   Procedure: THORACENTESIS;  Surgeon: Kara Dorn NOVAK, MD;  Location: [facility] ENDOSCOPY;  Service: Pulmonary;  Laterality: N/A;   THORACENTESIS N/A 04/08/2021   Procedure: THORACENTESIS;  Surgeon: Kara Dorn NOVAK, MD;  Location: [facility] ENDOSCOPY;  Service: Pulmonary;  Laterality: N/A;   THORACENTESIS N/A 04/24/2021   Procedure: THORACENTESIS;  Surgeon: Alaine Vicenta NOVAK, MD;  Location: [facility] ENDOSCOPY;  Service: Cardiopulmonary;  Laterality: N/A;   Social History   Social History Narrative   ** Merged History Encounter **       Immunization History  Administered Date(s) Administered   INFLUENZA, HIGH DOSE SEASONAL PF 07/05/2014, 03/03/2015, 06/29/2015, 07/09/2016, 03/03/2017, 07/24/2017, 02/05/2018, 06/23/2018, 06/08/2019, 02/18/2020, 06/08/2020   Influenza, Quadrivalent, Recombinant, Inj, Pf 03/04/2019   Influenza-Unspecified 01/24/2014, 02/23/2023   PFIZER Comirnaty(Gray Top)Covid-19 Tri-Sucrose Vaccine 02/25/2023   PFIZER(Purple Top)SARS-COV-2 Vaccination 06/16/2019, 07/07/2019, 02/18/2020, 06/08/2020   PNEUMOCOCCAL CONJUGATE-20 08/05/2022   Pfizer(Comirnaty)Fall Seasonal Vaccine 12 years and older 02/26/2024   Pneumococcal  Polysaccharide-23 03/22/2015, 06/29/2015, 07/09/2016, 07/24/2017, 06/23/2018, 06/08/2019, 06/08/2020, 06/03/2021   RSV,unspecified 02/27/2023   Zoster Recombinant(Shingrix) 03/09/2017     Objective: Vital Signs: There were no vitals taken for this visit.   Physical Exam   Musculoskeletal Exam: ***  CDAI Exam: CDAI Score: -- Patient Global: --; Provider Global: -- Swollen: --; Tender: -- Joint Exam 05/12/2024   No joint exam has been documented for this visit   There is currently no information documented on the homunculus. Go to the Rheumatology activity and complete the homunculus joint exam.  Investigation: No additional findings.  Imaging: No results found.  Recent Labs: Lab Results  Component Value Date   WBC 7.5 02/09/2024   HGB 15.7 (H) 02/09/2024   PLT 252 02/09/2024   NA 136 02/09/2024   K 4.2 02/09/2024   CL 97 (L) 02/09/2024   CO2 31 02/09/2024   GLUCOSE 88 02/09/2024   BUN 13 02/09/2024   CREATININE 0.68 02/09/2024   BILITOT 0.6 02/09/2024   ALKPHOS 51 05/01/2022   AST 20 02/09/2024   ALT 22 02/09/2024   PROT 5.8 (L) 02/09/2024   ALBUMIN 3.7 05/01/2022   CALCIUM  9.3 02/09/2024   GFRAA 90 12/27/2018   QFTBGOLDPLUS Negative 11/28/2021    Speciality Comments: No specialty comments available.  Procedures:  No procedures performed Allergies: Erythromycin, Azathioprine , Sulfa  antibiotics, Azithromycin , Condrolite [glucosamine-chondroitin-msm], Glucosamine, Glucosamine sulfate-msm, and Sulfamethoxazole -trimethoprim    Assessment / Plan:     Visit Diagnoses: No diagnosis found.  ***  Orders: No orders of the defined types were placed in this encounter.  No orders of the defined types were placed in this encounter.    Follow-Up Instructions: No follow-ups on file.   Shamera Yarberry M Franny Selvage, CMA  Note - This record has been created using Animal nutritionist.  Chart creation errors have been sought, but may not always  have been located.  Such creation  errors do not reflect on  the standard of medical care.

## 2024-05-04 ENCOUNTER — Encounter: Payer: Self-pay | Admitting: Cardiology

## 2024-05-12 ENCOUNTER — Other Ambulatory Visit: Payer: Self-pay | Admitting: Pharmacist

## 2024-05-12 ENCOUNTER — Ambulatory Visit: Admitting: Internal Medicine

## 2024-05-12 ENCOUNTER — Encounter: Payer: Self-pay | Admitting: Internal Medicine

## 2024-05-12 VITALS — BP 136/80 | HR 98 | Temp 97.1°F | Resp 13 | Ht 62.0 in | Wt 168.0 lb

## 2024-05-12 DIAGNOSIS — M810 Age-related osteoporosis without current pathological fracture: Secondary | ICD-10-CM | POA: Diagnosis present

## 2024-05-12 DIAGNOSIS — Z7952 Long term (current) use of systemic steroids: Secondary | ICD-10-CM | POA: Diagnosis present

## 2024-05-12 DIAGNOSIS — J90 Pleural effusion, not elsewhere classified: Secondary | ICD-10-CM | POA: Insufficient documentation

## 2024-05-12 DIAGNOSIS — M059 Rheumatoid arthritis with rheumatoid factor, unspecified: Secondary | ICD-10-CM | POA: Insufficient documentation

## 2024-05-12 DIAGNOSIS — Z79899 Other long term (current) drug therapy: Secondary | ICD-10-CM | POA: Insufficient documentation

## 2024-05-12 NOTE — Progress Notes (Signed)
 Reclast  order palced for Jan 2026

## 2024-05-12 NOTE — Progress Notes (Signed)
 Referral placed for Zoledronic  acid (Reclast ) IV (G6510) at [facility] [phone]) [place] to start benefits investigation.  Diagnosis: osteoporosis  Provider: Dr. Lonni Ester  Dose: 5mg  iV every 12 months Premedications: acetaminophen  650mg  p.o. and diphenhydramine  25mg  p.o. Labs to be drawn with infusions: NONE  Last dose/infusion date: 05/22/2023 Last Clinic Visit: 05/12/2024 Next Clinic Visit: 08/11/2024  Pertinent baseline labs: [facility] 05/12/24  Once benefits are processed, infusion center will contact patient to schedule.  Sherry Pennant, PharmD, MPH, BCPS, CPP Clinical Pharmacist

## 2024-05-13 ENCOUNTER — Ambulatory Visit: Payer: Self-pay | Admitting: Internal Medicine

## 2024-05-13 LAB — CBC WITH DIFFERENTIAL/PLATELET
Absolute Lymphocytes: 664 {cells}/uL — ABNORMAL LOW (ref 850–3900)
Absolute Monocytes: 431 {cells}/uL (ref 200–950)
Basophils Absolute: 29 {cells}/uL (ref 0–200)
Basophils Relative: 0.4 %
Eosinophils Absolute: 29 {cells}/uL (ref 15–500)
Eosinophils Relative: 0.4 %
HCT: 47.6 % — ABNORMAL HIGH (ref 35.9–46.0)
Hemoglobin: 15.7 g/dL — ABNORMAL HIGH (ref 11.7–15.5)
MCH: 30.8 pg (ref 27.0–33.0)
MCHC: 33 g/dL (ref 31.6–35.4)
MCV: 93.3 fL (ref 81.4–101.7)
MPV: 11 fL (ref 7.5–12.5)
Monocytes Relative: 5.9 %
Neutro Abs: 6147 {cells}/uL (ref 1500–7800)
Neutrophils Relative %: 84.2 %
Platelets: 209 Thousand/uL (ref 140–400)
RBC: 5.1 Million/uL (ref 3.80–5.10)
RDW: 14.2 % (ref 11.0–15.0)
Total Lymphocyte: 9.1 %
WBC: 7.3 Thousand/uL (ref 3.8–10.8)

## 2024-05-13 LAB — COMPREHENSIVE METABOLIC PANEL WITH GFR
AG Ratio: 1.9 (calc) (ref 1.0–2.5)
ALT: 20 U/L (ref 6–29)
AST: 21 U/L (ref 10–35)
Albumin: 3.9 g/dL (ref 3.6–5.1)
Alkaline phosphatase (APISO): 67 U/L (ref 37–153)
BUN: 13 mg/dL (ref 7–25)
CO2: 29 mmol/L (ref 20–32)
Calcium: 9.2 mg/dL (ref 8.6–10.4)
Chloride: 98 mmol/L (ref 98–110)
Creat: 0.63 mg/dL (ref 0.60–0.95)
Globulin: 2.1 g/dL (ref 1.9–3.7)
Glucose, Bld: 83 mg/dL (ref 65–99)
Potassium: 4.6 mmol/L (ref 3.5–5.3)
Sodium: 136 mmol/L (ref 135–146)
Total Bilirubin: 0.7 mg/dL (ref 0.2–1.2)
Total Protein: 6 g/dL — ABNORMAL LOW (ref 6.1–8.1)
eGFR: 87 mL/min/1.73m2

## 2024-05-13 LAB — SEDIMENTATION RATE: Sed Rate: 2 mm/h (ref 0–30)

## 2024-05-24 ENCOUNTER — Other Ambulatory Visit: Payer: Self-pay | Admitting: Internal Medicine

## 2024-05-27 ENCOUNTER — Telehealth: Payer: Self-pay

## 2024-05-27 NOTE — Telephone Encounter (Signed)
 Dr. Jeannetta, patient will be scheduled as soon as possible.  Auth Submission: NO AUTH NEEDED Site of care: Site of care: [facility] Payer: Medicare A/B with AARP supplement Medication & CPT/J Code(s) submitted: Reclast  (Zolendronic acid) J3489 Diagnosis Code:  Route of submission (phone, fax, portal):  Phone # Fax # Auth type: Buy/Bill PB Units/visits requested: 5mg  x 1 dose Reference number:  Approval from: 05/27/24 to 05/25/25

## 2024-05-30 ENCOUNTER — Telehealth: Payer: Self-pay

## 2024-05-30 NOTE — Telephone Encounter (Signed)
 SABRA

## 2024-05-30 NOTE — Progress Notes (Signed)
 "   Date:  05/31/2024   ID:  Rebecca Short, DOB 03-01-1941, MRN [id number]  PCP:  Okey Carlin Redbird, MD Cardiologist: Wilbert Bihari, MD  Sleep Medicine:  Wilbert Bihari, MD Electrophysiologist:  None   Chief Complaint:  OSA  History of Present Illness:    Rebecca Short is a 84 y.o. 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   She is doing well today.  She denies any CP or pressure, SOB, DOE, LE edema, palpitations or syncope.  Sometimes she gets some dizziness in the am if she has not drank enough water.   She is doing well with her PAP device and thinks that she has gotten used to it.  She tolerates the mask and feels the pressure is adequate.  Since going on PAP she feels rested in the am and has no significant daytime sleepiness.  She denies any significant mouth or nasal dryness or nasal congestion.  She does not think that she snores. An Epworth Sleepiness Scale score was calculated the office today and this endorsed at 3 arguing against residual daytime sleepiness. Patient denies any episodes of bruxism, restless legs, no hyponogognic hallucinations or cataplectic events.     Prior CV studies:   The following studies were reviewed today:  PAP compliance download  Past Medical History:  Diagnosis Date   A-fib (HCC)    Allergy     Atrial fibrillation (HCC)    FH: cholecystectomy 1997   Macular degeneration    OSA (obstructive sleep apnea)    mild with total AHI 7.25/hr and severe during REM sleep at 35/hr now on CPAP at 6cm H2O   Past Surgical History:  Procedure Laterality Date   CHEST TUBE INSERTION Left 11/27/2021   Procedure: CHEST TUBE INSERTION;  Surgeon: Kara Dorn NOVAK, MD;  Location: [facility] ENDOSCOPY;  Service: Pulmonary;  Laterality: Left;  Prefer AM slot, thanks   CHOLECYSTECTOMY     PERICARDIOCENTESIS N/A 01/07/2021   Procedure: PERICARDIOCENTESIS;  Surgeon: Mady Bruckner, MD;  Location: [facility] INVASIVE CV LAB;  Service: Cardiovascular;  Laterality: N/A;   REPLACEMENT TOTAL KNEE BILATERAL     THORACENTESIS N/A 01/25/2021   Procedure: THORACENTESIS;  Surgeon: Kara Dorn NOVAK, MD;  Location: [facility] ENDOSCOPY;  Service: Pulmonary;  Laterality: N/A;   THORACENTESIS N/A 04/08/2021   Procedure: THORACENTESIS;  Surgeon: Kara Dorn NOVAK, MD;  Location: [facility] ENDOSCOPY;  Service: Pulmonary;  Laterality: N/A;   THORACENTESIS N/A 04/24/2021   Procedure: THORACENTESIS;  Surgeon: Alaine Vicenta NOVAK, MD;  Location: [facility] ENDOSCOPY;  Service: Cardiopulmonary;  Laterality: N/A;     Current Meds  Medication Sig   atenolol  (TENORMIN ) 25 MG tablet Take half tablet by mouth daily as needed for heart rate >110   CALCIUM  CITRATE-VITAMIN D  PO Take 1 tablet by mouth at bedtime.   carboxymethylcellulose (REFRESH PLUS) 0.5 % SOLN Place 1 drop into both eyes 2 (two) times daily.   ELIQUIS  5 MG TABS tablet TAKE 1 TABLET BY MOUTH TWICE A DAY   EPIPEN 2-PAK 0.3 MG/0.3ML SOAJ injection Inject 0.3 mg into the muscle once as needed for anaphylaxis (severe allergic reaction).   famotidine  (PEPCID ) 10 MG tablet Take 1-2 tablets by mouth twice daily as needed   folic acid  (FOLVITE ) 1 MG tablet Take 1 tablet (1 mg total) by mouth daily.   furosemide  (LASIX ) 20 MG  tablet TAKE 1 TABLET BY MOUTH EVERY DAY   methotrexate  50 MG/2ML injection INJECT 0.5 MLS (12.5 MG TOTAL) INTO THE SKIN ONCE A WEEK.   Multiple Vitamin (MULTIVITAMIN WITH MINERALS) TABS tablet Take 1 tablet by mouth every morning.   Multiple Vitamins-Minerals (PRESERVISION AREDS PO) Take 1 capsule by mouth 2 (two) times daily.   potassium chloride  (KLOR-CON ) 20 MEQ packet USE 1 PACKET ONCE DAILY   predniSONE  (DELTASONE ) 5 MG tablet TAKE 1 TABLET BY MOUTH EVERY DAY WITH BREAKFAST   Tuberculin-Allergy  Syringes 27G X 1/2 0.5 ML MISC Use as directed to administer Methotrexare     Allergies:   Erythromycin, Azathioprine , Sulfa   antibiotics, Azithromycin , Condrolite [glucosamine-chondroitin-msm], Glucosamine, Glucosamine sulfate-msm, and Sulfamethoxazole -trimethoprim    Social History   Tobacco Use   Smoking status: Never    Passive exposure: Past   Smokeless tobacco: Never  Vaping Use   Vaping status: Never Used  Substance Use Topics   Alcohol  use: Not Currently   Drug use: Never     Family Hx: The patient's family history includes Cancer in her maternal grandmother; Dementia in her paternal grandmother; Diabetes in her brother; Heart Problems in her father; Kidney failure in her brother; Macular degeneration in her mother; Osteoporosis in her mother; Stroke in her father; Suicidality in an other family member. There is no history of Heart attack.  ROS:   Please see the history of present illness.     All other systems reviewed and are negative.   Labs/Other Tests and Data Reviewed:        EKG Interpretation Date/Time:  Tuesday May 31 2024 09:13:05 EST Ventricular Rate:  90 PR Interval:    QRS Duration:  72 QT Interval:  356 QTC Calculation: 435 R Axis:   8  Text Interpretation: Atrial fibrillation Low voltage QRS Cannot rule out Anteroseptal infarct , age undetermined When compared with ECG of 05-Jul-2023 15:23, PREVIOUS ECG IS PRESENT Confirmed by Shlomo Corning ([id number]) on 05/31/2024 9:14:50 AM      Recent Labs: 05/12/2024: ALT 20; BUN 13; Creat 0.63; Hemoglobin 15.7; Platelets 209; Potassium 4.6; Sodium 136   Recent Lipid Panel Lab Results  Component Value Date/Time   CHOL 144 08/27/2022 08:36 AM   TRIG 101 08/27/2022 08:36 AM   HDL 57 08/27/2022 08:36 AM   CHOLHDL 2.5 08/27/2022 08:36 AM   LDLCALC 68 08/27/2022 08:36 AM    Wt Readings from Last 3 Encounters:  05/31/24 167 lb (75.8 kg)  05/12/24 168 lb (76.2 kg)  02/09/24 163 lb (73.9 kg)     Objective:    Vital Signs:  BP 126/86 (BP Location: Left Arm, Patient Position: Sitting)   Pulse 100   Ht 5' 1 (1.549 m)   Wt 167 lb  (75.8 kg)   SpO2 99%   BMI 31.55 kg/m    GEN: Well nourished, well developed in no acute distress HEENT: Normal NECK: No JVD; No carotid bruits LYMPHATICS: No lymphadenopathy CARDIAC:irregularly irregular, no murmurs, rubs, gallops RESPIRATORY:  Clear to auscultation without rales, wheezing or rhonchi  ABDOMEN: Soft, non-tender, non-distended MUSCULOSKELETAL:  No edema; No deformity  SKIN: Warm and dry NEUROLOGIC:  Alert and oriented x 3 PSYCHIATRIC:  Normal affect   ASSESSMENT & PLAN:    OSA - The patient is tolerating PAP therapy well without any problems. The PAP download performed by his DME was personally reviewed and interpreted by me today and showed an AHI of 0.2/hr on 6 cm H2O with 100% compliance in using more than  4 hours nightly.  The patient has been using and benefiting from PAP use and will continue to benefit from therapy.    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 and does have bruising sometimes which is worse on prednisone  -Continue Atenolol  25mg  PRN for palpitations, Eliquis  5mg  BID with PRN refills -I have personally reviewed and interpreted outside labs performed by patient's PCP which showed serum 0.63 and potassium 4.6 on 05/12/2024, hemoglobin 15.7  Recurrent pleural effusion -No malignancy on PET/CT  Aortic atherosclerosis Hyperlipidemia -Cath negative in 2010 -I have personally reviewed and interpreted outside labs performed by patient's PCP which showed LDL 68 and HDL 57 on 08/27/2022 -Currently not on statin therapy  Medication Adjustments/Labs and Tests Ordered: Current medicines are reviewed at length with the patient today.  Concerns regarding medicines are outlined above.  Tests Ordered: No orders of the defined types were placed in this encounter.   Medication Changes: No orders of the defined types were placed in this  encounter.    Disposition:  Follow up in 1 year(s)  Signed, Wilbert Bihari, MD  05/31/2024 9:01 AM    [facility] "

## 2024-05-31 ENCOUNTER — Ambulatory Visit: Admitting: Cardiology

## 2024-05-31 ENCOUNTER — Encounter: Payer: Self-pay | Admitting: Cardiology

## 2024-05-31 VITALS — BP 126/86 | HR 100 | Ht 61.0 in | Wt 167.0 lb

## 2024-05-31 DIAGNOSIS — G4733 Obstructive sleep apnea (adult) (pediatric): Secondary | ICD-10-CM | POA: Diagnosis not present

## 2024-05-31 DIAGNOSIS — E7800 Pure hypercholesterolemia, unspecified: Secondary | ICD-10-CM | POA: Diagnosis not present

## 2024-05-31 DIAGNOSIS — I4821 Permanent atrial fibrillation: Secondary | ICD-10-CM | POA: Insufficient documentation

## 2024-05-31 DIAGNOSIS — I700 Atherosclerosis of aorta: Secondary | ICD-10-CM | POA: Insufficient documentation

## 2024-05-31 NOTE — Patient Instructions (Signed)

## 2024-06-07 ENCOUNTER — Other Ambulatory Visit: Payer: Self-pay | Admitting: Internal Medicine

## 2024-06-07 DIAGNOSIS — M0579 Rheumatoid arthritis with rheumatoid factor of multiple sites without organ or systems involvement: Secondary | ICD-10-CM

## 2024-06-07 NOTE — Telephone Encounter (Signed)
 Last Fill: 02/26/2024  Labs: 05/12/2024 Hgb remains slightly above normal at 15.7 but no change from last time. There were no other significant abnormal results and no new medication changes needed.   Next Visit: 08/11/2024  Last Visit: 05/12/2024  DX: Rheumatoid arthritis with positive rheumatoid factor, involving unspecified site   Current Dose per office note 05/12/2024: methotrexate  12.5 mg subcu weekly   Okay to refill Methotrexate ?

## 2024-06-08 ENCOUNTER — Ambulatory Visit

## 2024-06-10 ENCOUNTER — Ambulatory Visit (INDEPENDENT_AMBULATORY_CARE_PROVIDER_SITE_OTHER)

## 2024-06-10 VITALS — BP 129/83 | HR 79 | Temp 98.0°F | Resp 16 | Ht 61.5 in | Wt 170.8 lb

## 2024-06-10 DIAGNOSIS — M810 Age-related osteoporosis without current pathological fracture: Secondary | ICD-10-CM | POA: Diagnosis not present

## 2024-06-10 MED ORDER — ACETAMINOPHEN 325 MG PO TABS
650.0000 mg | ORAL_TABLET | Freq: Once | ORAL | Status: DC
  Filled 2024-06-10: qty 2

## 2024-06-10 MED ORDER — ZOLEDRONIC ACID 5 MG/100ML IV SOLN
5.0000 mg | Freq: Once | INTRAVENOUS | Status: AC
  Administered 2024-06-10: 5 mg via INTRAVENOUS
  Filled 2024-06-10: qty 100

## 2024-06-10 MED ORDER — DIPHENHYDRAMINE HCL 25 MG PO CAPS
25.0000 mg | ORAL_CAPSULE | Freq: Once | ORAL | Status: DC
  Filled 2024-06-10: qty 1

## 2024-06-10 MED ORDER — SODIUM CHLORIDE 0.9 % IV SOLN: INTRAVENOUS | Status: DC

## 2024-06-10 NOTE — Progress Notes (Signed)
 Diagnosis: Osteoporosis  Provider:  Lonna Coder MD  Procedure: IV Infusion  IV Type: Peripheral, IV Location: R Antecubital   Reclast  (Zolendronic Acid), Dose: 5 mg  Infusion Start Time: 1136  Infusion Stop Time: 1213  Post Infusion IV Care: Observation period completed and Peripheral IV Discontinued  Discharge: Condition: Good, Destination: Home . AVS Provided  Performed by:  Leita FORBES Miles, LPN

## 2024-06-15 ENCOUNTER — Telehealth: Payer: Self-pay

## 2024-06-15 NOTE — Telephone Encounter (Addendum)
 Received notification from [employer] regarding a prior authorization for methotrexate  sodium (PF). Authorization has been APPROVED from 06/07/2024 until further notice.    Authorization # [id number] Phone # [phone]

## 2024-06-15 NOTE — Telephone Encounter (Signed)
 Submitted a Prior Authorization request to [employer] for Methotrexate   via CoverMyMeds. Will update once we receive a response.

## 2024-06-17 ENCOUNTER — Ambulatory Visit: Payer: Self-pay

## 2024-06-17 NOTE — Telephone Encounter (Signed)
 I called and spoke with the pt and notified if her symptoms return to seek emergent care  She verbalized understanding  Will keep planned appt 06/21/24

## 2024-06-17 NOTE — Telephone Encounter (Signed)
 FYI Only or Action Required?: Action required by provider: update on patient condition.  Patient is followed in Pulmonology for pleural effusion, last seen on 12/22/2023 by Kara Dorn NOVAK, MD.  Called Nurse Triage reporting Dizziness.  Symptoms began yesterday.  Interventions attempted: Nothing.  Symptoms are: completely resolved.  Triage Disposition: Home Care  Patient/caregiver understands and will follow disposition?: Yes

## 2024-06-17 NOTE — Telephone Encounter (Signed)
 "    Reason for Triage: states O2 is dropping nearly 10 points at night (from 99 t0 90). States O2 has stabilized since waking at around 96. Blood pressure is low at night (83/51) (77/51) during the night (100/62). Patient would like a chest x-ray done today to determine the cause.   Reason for Disposition  Dizziness caused by not drinking enough fluids  Pulse oximetry, questions about  Answer Assessment - Initial Assessment Questions Pt called requesting chest xray with PAS.  Pt states that her BP was low last night and oxygen level was bouncing around and was as low as 90, then went to 91,92,93,94,95,96 and stayed there. RN explained that if her fingers were cold it would read lower until it got a true reading. RN also advised our oxygen level often does drop in the night as we are not upright taking deeper breaths.Rn asked why she was checking her vital signs in the middle of the night. She states she got up to the bathroom and got woozy and knew she needed to go back and lay down. At that time her BP was 77/51. She states that happens if she doesn't drink enough water but she was concerned about the oxygen level bouncing around. Pt denies any shortness of breath, chest pain, cough, fever, wheezing. States she has general congestion of her nasal area that is chronic and not any worse than normal. She denies all symptoms. Her oxygen on phone with RN is 97%. Her BP is now 117/69 and she denies all symptoms and states her PCP and her cardiologist are both aware that this happens at times. Rn did advise to call them for further instructions on that. RN did advise her to call us  if she gets any of the above listed symptoms that she currently denies. She states understanding and states she has an appt with Dr. Kara on Tuesday if the weather cooperates. Rn advised pt to keep that appt unless weather doesn't allow. She stated understanding.    1. DESCRIPTION: Describe your dizziness.     None  currently but felt woozy 2. LIGHTHEADED: Do you feel lightheaded? (e.g., somewhat faint, woozy, weak upon standing)     Felt woozy 3. VERTIGO: Do you feel like either you or the room is spinning or tilting? (i.e., vertigo)     no 4. SEVERITY: How bad is it?  Do you feel like you are going to faint? Can you stand and walk?     Felt like she was going to- none now 5. ONSET:  When did the dizziness begin?     In the middle of the night 6. AGGRAVATING FACTORS: Does anything make it worse? (e.g., standing, change in head position)     Going from lying to standing  7. CAUSE: What do you think is causing the dizziness? (e.g., decreased fluids or food, diarrhea, emotional distress, heat exposure, new medicine, sudden standing, vomiting; unknown)     States this happens some times when she doesn't drink enough water during the day 8. RECURRENT SYMPTOM: Have you had dizziness before? If Yes, ask: When was the last time? What happened that time?     Yes, she drinks more water and it gets better. Drinking more water today, BP is now up and all symptoms resolved 9. OTHER SYMPTOMS: Do you have any other symptoms? (e.g., fever, chest pain, vomiting, diarrhea, bleeding)       denies  Answer Assessment - Initial Assessment Questions 1. MAIN CONCERN OR  SYMPTOM: What's your main concern? (e.g., low oxygen level, breathing difficulty) What question do you have?     Oxygen levels bouncing around at night 2. ONSET: When did the  problem  start?      Last night 3. OXYGEN LEVEL: What is your reading (oxygen level) today? What is your usual oxygen saturation reading? (e.g., 95%)     97 4. VSS MONITORING: Do you monitor/measure your oxygen level or vital signs? (e.g., yes, no, measurements are automatically sent to provider/call center). Document CURRENT and NORMAL BASELINE values if available.       Yes when things change 5. BREATHING DIFFICULTY: Are you having any difficulty  breathing? If Yes, ask: How bad is it?  (e.g., none, mild, moderate, severe)      denies 6. OTHER SYMPTOMS: Do you have any other symptoms? (e.g., fever, change in sputum)     denies  Protocols used: Dizziness - Lightheadedness-A-AH, Oxygen Monitoring and Hypoxia-A-AH  "

## 2024-06-21 ENCOUNTER — Ambulatory Visit: Admitting: Pulmonary Disease

## 2024-07-05 ENCOUNTER — Ambulatory Visit: Admitting: Pulmonary Disease

## 2024-08-11 ENCOUNTER — Ambulatory Visit: Admitting: Internal Medicine

## 2024-08-24 ENCOUNTER — Ambulatory Visit: Admitting: Pulmonary Disease
